# Patient Record
Sex: Male | Born: 1950 | Race: White | Hispanic: No | Smoking: Former smoker
Health system: Southern US, Community
[De-identification: ages and names within clinical notes are randomized; demographics above are authoritative.]

## PROBLEM LIST (undated history)

## (undated) DIAGNOSIS — Z862 Personal history of diseases of the blood and blood-forming organs and certain disorders involving the immune mechanism: Secondary | ICD-10-CM

## (undated) DIAGNOSIS — K7290 Hepatic failure, unspecified without coma: Secondary | ICD-10-CM

## (undated) DIAGNOSIS — I499 Cardiac arrhythmia, unspecified: Secondary | ICD-10-CM

## (undated) DIAGNOSIS — K7682 Hepatic encephalopathy: Secondary | ICD-10-CM

## (undated) DIAGNOSIS — D539 Nutritional anemia, unspecified: Secondary | ICD-10-CM

## (undated) DIAGNOSIS — I4891 Unspecified atrial fibrillation: Secondary | ICD-10-CM

## (undated) DIAGNOSIS — I509 Heart failure, unspecified: Secondary | ICD-10-CM

## (undated) DIAGNOSIS — Z860101 Personal history of adenomatous and serrated colon polyps: Secondary | ICD-10-CM

## (undated) DIAGNOSIS — E119 Type 2 diabetes mellitus without complications: Secondary | ICD-10-CM

## (undated) DIAGNOSIS — I4819 Other persistent atrial fibrillation: Secondary | ICD-10-CM

## (undated) DIAGNOSIS — D689 Coagulation defect, unspecified: Secondary | ICD-10-CM

## (undated) DIAGNOSIS — I10 Essential (primary) hypertension: Secondary | ICD-10-CM

## (undated) DIAGNOSIS — E079 Disorder of thyroid, unspecified: Secondary | ICD-10-CM

## (undated) DIAGNOSIS — I5030 Unspecified diastolic (congestive) heart failure: Secondary | ICD-10-CM

## (undated) DIAGNOSIS — F1011 Alcohol abuse, in remission: Secondary | ICD-10-CM

## (undated) DIAGNOSIS — Z7901 Long term (current) use of anticoagulants: Secondary | ICD-10-CM

## (undated) DIAGNOSIS — I38 Endocarditis, valve unspecified: Secondary | ICD-10-CM

## (undated) DIAGNOSIS — I2510 Atherosclerotic heart disease of native coronary artery without angina pectoris: Secondary | ICD-10-CM

## (undated) DIAGNOSIS — Z86010 Personal history of colonic polyps: Secondary | ICD-10-CM

## (undated) DIAGNOSIS — E039 Hypothyroidism, unspecified: Secondary | ICD-10-CM

## (undated) DIAGNOSIS — E782 Mixed hyperlipidemia: Secondary | ICD-10-CM

## (undated) DIAGNOSIS — N183 Chronic kidney disease, stage 3 unspecified: Secondary | ICD-10-CM

## (undated) DIAGNOSIS — I429 Cardiomyopathy, unspecified: Secondary | ICD-10-CM

## (undated) DIAGNOSIS — K760 Fatty (change of) liver, not elsewhere classified: Secondary | ICD-10-CM

## (undated) DIAGNOSIS — E785 Hyperlipidemia, unspecified: Secondary | ICD-10-CM

## (undated) DIAGNOSIS — I428 Other cardiomyopathies: Secondary | ICD-10-CM

## (undated) HISTORY — PX: HAMMER TOE SURGERY: SHX385

## (undated) HISTORY — DX: Chronic kidney disease, stage 3 unspecified: N18.30

## (undated) HISTORY — DX: Fatty (change of) liver, not elsewhere classified: K76.0

## (undated) HISTORY — DX: Personal history of diseases of the blood and blood-forming organs and certain disorders involving the immune mechanism: Z86.2

## (undated) HISTORY — DX: Alcohol abuse, in remission: F10.11

## (undated) HISTORY — DX: Other cardiomyopathies: I42.8

## (undated) HISTORY — DX: Personal history of adenomatous and serrated colon polyps: Z86.0101

## (undated) HISTORY — DX: Coagulation defect, unspecified: Z79.01

## (undated) HISTORY — DX: Hepatic failure, unspecified without coma: K72.90

## (undated) HISTORY — DX: Mixed hyperlipidemia: E78.2

## (undated) HISTORY — DX: Atherosclerotic heart disease of native coronary artery without angina pectoris: I25.10

## (undated) HISTORY — DX: Other persistent atrial fibrillation: I48.19

## (undated) HISTORY — DX: Nutritional anemia, unspecified: D53.9

## (undated) HISTORY — PX: CARDIAC CATHETERIZATION: SHX172

## (undated) HISTORY — DX: Unspecified diastolic (congestive) heart failure: I50.30

## (undated) HISTORY — DX: Endocarditis, valve unspecified: I38

## (undated) HISTORY — DX: Personal history of colonic polyps: Z86.010

## (undated) HISTORY — PX: COLONOSCOPY: SHX174

---
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
## 2006-06-04 ENCOUNTER — Ambulatory Visit: Payer: Self-pay | Admitting: General Surgery

## 2007-02-12 ENCOUNTER — Inpatient Hospital Stay: Payer: Self-pay | Admitting: Internal Medicine

## 2007-02-12 ENCOUNTER — Other Ambulatory Visit: Payer: Self-pay

## 2007-10-30 ENCOUNTER — Emergency Department: Payer: Self-pay | Admitting: Emergency Medicine

## 2007-10-30 ENCOUNTER — Other Ambulatory Visit: Payer: Self-pay

## 2012-02-22 ENCOUNTER — Inpatient Hospital Stay: Payer: Self-pay | Admitting: Internal Medicine

## 2012-02-22 LAB — APTT: Activated PTT: 30.1 secs (ref 23.6–35.9)

## 2012-02-22 LAB — HEPATIC FUNCTION PANEL A (ARMC)
Albumin: 4.2 g/dL
Alkaline Phosphatase: 91 U/L
Bilirubin, Direct: 0.1 mg/dL
Bilirubin,Total: 0.5 mg/dL
SGOT(AST): 32 U/L
SGPT (ALT): 25 U/L
Total Protein: 8.9 g/dL — ABNORMAL HIGH

## 2012-02-22 LAB — CK TOTAL AND CKMB (NOT AT ARMC)
CK, Total: 172 U/L (ref 35–232)
CK, Total: 171 U/L (ref 35–232)

## 2012-02-22 LAB — TROPONIN I
Troponin-I: 0.05 ng/mL
Troponin-I: 0.06 ng/mL — ABNORMAL HIGH
Troponin-I: 0.06 ng/mL — ABNORMAL HIGH

## 2012-02-22 LAB — BASIC METABOLIC PANEL
Anion Gap: 13 (ref 7–16)
BUN: 24 mg/dL — ABNORMAL HIGH (ref 7–18)
Chloride: 102 mmol/L (ref 98–107)
EGFR (African American): >60
Glucose: 139 mg/dL — ABNORMAL HIGH (ref 65–99)
Sodium: 135 mmol/L — ABNORMAL LOW (ref 136–145)

## 2012-02-22 LAB — DIGOXIN LEVEL: Digoxin: 0.7 ng/mL

## 2012-02-22 LAB — CBC
HCT: 44.2 % (ref 40.0–52.0)
MCH: 31.5 pg (ref 26.0–34.0)
MCV: 95 fL (ref 80–100)
RBC: 4.67 10*6/uL (ref 4.40–5.90)
RDW: 15.3 % — ABNORMAL HIGH (ref 11.5–14.5)
WBC: 8.5 10*3/uL (ref 3.8–10.6)

## 2012-02-22 LAB — ETHANOL
Ethanol %: <0.003 %
Ethanol: <3 mg/dL

## 2012-02-22 LAB — PROTIME-INR
INR: 1.1
Prothrombin Time: 14.3 secs (ref 11.5–14.7)

## 2012-02-22 LAB — TSH: Thyroid Stimulating Horm: 3.86 u[IU]/mL

## 2012-02-22 LAB — PRO B NATRIURETIC PEPTIDE: B-Type Natriuretic Peptide: 795 pg/mL — ABNORMAL HIGH

## 2012-02-22 LAB — MAGNESIUM: Magnesium: 1.9 mg/dL

## 2012-02-23 LAB — CBC WITH DIFFERENTIAL/PLATELET
Basophil %: 1 %
HGB: 14.7 g/dL (ref 13.0–18.0)
Lymphocyte %: 19.9 %
MCHC: 32.6 g/dL (ref 32.0–36.0)
MCV: 95 fL (ref 80–100)
Platelet: 144 10*3/uL — ABNORMAL LOW (ref 150–440)
RBC: 4.74 10*6/uL (ref 4.40–5.90)

## 2012-02-23 LAB — BASIC METABOLIC PANEL
Anion Gap: 7 (ref 7–16)
Chloride: 107 mmol/L (ref 98–107)
Creatinine: 1.38 mg/dL — ABNORMAL HIGH (ref 0.60–1.30)
Glucose: 109 mg/dL — ABNORMAL HIGH (ref 65–99)
Sodium: 138 mmol/L (ref 136–145)

## 2012-02-23 LAB — MAGNESIUM: Magnesium: 2.2 mg/dL

## 2012-02-24 LAB — URINALYSIS, COMPLETE
Bilirubin,UR: NEGATIVE
Ketone: NEGATIVE
Ph: 5 (ref 4.5–8.0)
Squamous Epithelial: NONE SEEN
WBC UR: <1 /HPF (ref 0–5)

## 2012-02-24 LAB — BASIC METABOLIC PANEL
Anion Gap: 6 — ABNORMAL LOW (ref 7–16)
Chloride: 106 mmol/L (ref 98–107)
Creatinine: 1.25 mg/dL (ref 0.60–1.30)
EGFR (Non-African Amer.): >60
Glucose: 112 mg/dL — ABNORMAL HIGH (ref 65–99)
Potassium: 5 mmol/L (ref 3.5–5.1)
Sodium: 138 mmol/L (ref 136–145)

## 2012-05-23 ENCOUNTER — Ambulatory Visit: Payer: Self-pay | Admitting: Unknown Physician Specialty

## 2012-05-24 LAB — PATHOLOGY REPORT

## 2013-01-12 ENCOUNTER — Inpatient Hospital Stay: Payer: Self-pay | Admitting: Internal Medicine

## 2013-01-12 LAB — CBC WITH DIFFERENTIAL/PLATELET
Basophil #: 0 x10 3/mm 3
Basophil %: 0.5 %
Eosinophil #: 0.1 x10 3/mm 3
Eosinophil %: 0.8 %
HCT: 43.2 %
HGB: 14.6 g/dL
Lymphocyte %: 7.7 %
Lymphs Abs: 0.5 x10 3/mm 3 — ABNORMAL LOW
MCH: 32.3 pg
MCHC: 33.7 g/dL
MCV: 96 fL
Monocyte #: 0.6 "x10 3/mm "
Monocyte %: 8.6 %
Neutrophil #: 5.8 x10 3/mm 3
Neutrophil %: 82.4 %
Platelet: 127 x10 3/mm 3 — ABNORMAL LOW
RBC: 4.51 x10 6/mm 3
RDW: 14.8 % — ABNORMAL HIGH
WBC: 7 x10 3/mm 3

## 2013-01-12 LAB — TROPONIN I
Troponin-I: 0.04 ng/mL
Troponin-I: 0.04 ng/mL
Troponin-I: 0.04 ng/mL
Troponin-I: 0.05 ng/mL

## 2013-01-12 LAB — RAPID INFLUENZA A&B ANTIGENS

## 2013-01-12 LAB — BASIC METABOLIC PANEL WITH GFR
Anion Gap: 8
BUN: 17 mg/dL
Calcium, Total: 8.7 mg/dL
Chloride: 102 mmol/L
Co2: 28 mmol/L
Creatinine: 1.49 mg/dL — ABNORMAL HIGH
EGFR (African American): 57 — ABNORMAL LOW
EGFR (Non-African Amer.): 50 — ABNORMAL LOW
Glucose: 157 mg/dL — ABNORMAL HIGH
Osmolality: 280
Potassium: 4.1 mmol/L
Sodium: 138 mmol/L

## 2013-01-12 LAB — CK TOTAL AND CKMB (NOT AT ARMC)
CK, Total: 68 U/L
CK, Total: 67 U/L (ref 35–232)
CK-MB: 0.8 ng/mL
CK-MB: 0.9 ng/mL (ref 0.5–3.6)

## 2013-01-13 LAB — BASIC METABOLIC PANEL
Anion Gap: 3 — ABNORMAL LOW (ref 7–16)
Chloride: 102 mmol/L (ref 98–107)
Creatinine: 1.32 mg/dL — ABNORMAL HIGH (ref 0.60–1.30)
EGFR (African American): >60
Glucose: 129 mg/dL — ABNORMAL HIGH (ref 65–99)
Osmolality: 274 (ref 275–301)
Potassium: 4.6 mmol/L (ref 3.5–5.1)
Sodium: 136 mmol/L (ref 136–145)

## 2013-01-13 LAB — CBC WITH DIFFERENTIAL/PLATELET
Basophil %: 0.5 %
Eosinophil #: 0 10*3/uL (ref 0.0–0.7)
Eosinophil %: 0.8 %
HCT: 40.7 % (ref 40.0–52.0)
HGB: 13.4 g/dL (ref 13.0–18.0)
MCH: 31.8 pg (ref 26.0–34.0)
MCHC: 33 g/dL (ref 32.0–36.0)
MCV: 96 fL (ref 80–100)
Monocyte #: 0.7 x10 3/mm (ref 0.2–1.0)
Monocyte %: 15.3 %
Neutrophil %: 63.4 %
Platelet: 93 10*3/uL — ABNORMAL LOW (ref 150–440)
RDW: 14.9 % — ABNORMAL HIGH (ref 11.5–14.5)
WBC: 4.5 10*3/uL (ref 3.8–10.6)

## 2013-01-16 LAB — CULTURE, BLOOD (SINGLE)

## 2013-04-28 DIAGNOSIS — I4819 Other persistent atrial fibrillation: Secondary | ICD-10-CM | POA: Insufficient documentation

## 2013-04-28 DIAGNOSIS — I5020 Unspecified systolic (congestive) heart failure: Secondary | ICD-10-CM | POA: Insufficient documentation

## 2013-04-28 DIAGNOSIS — Z7901 Long term (current) use of anticoagulants: Secondary | ICD-10-CM

## 2013-04-28 DIAGNOSIS — D689 Coagulation defect, unspecified: Secondary | ICD-10-CM | POA: Insufficient documentation

## 2013-04-28 DIAGNOSIS — I509 Heart failure, unspecified: Secondary | ICD-10-CM | POA: Insufficient documentation

## 2013-04-28 DIAGNOSIS — I4891 Unspecified atrial fibrillation: Secondary | ICD-10-CM | POA: Insufficient documentation

## 2013-04-28 DIAGNOSIS — I519 Heart disease, unspecified: Secondary | ICD-10-CM | POA: Insufficient documentation

## 2014-05-11 NOTE — Discharge Summary (Signed)
PATIENT NAME:  [name], [name] MR#:  [id number] DATE OF BIRTH:  1951/01/10  DATE OF ADMISSION:  02/22/2012 DATE OF DISCHARGE:  02/24/2012  PRIMARY CARE PHYSICIAN:  Dr. Hall Busing.  FINAL DIAGNOSES:  Atrial fibrillation with rapid ventricular response, hypertension, alcohol abuse, congestive heart failure diastolic dysfunction, compensated.   CONDITION:  Stable.   CODE STATUS:  FULL CODE.   MEDICATIONS:  Aspirin 81 mg by mouth daily, Digoxin  155 mcg by mouth daily, Lasix 20 mg by mouth daily, Lopressor 25 mg by mouth daily, lisinopril 5 mg by mouth daily, potassium 10 mEq by mouth daily, metformin 500 mg by mouth once daily, allopurinol 300 mg by mouth daily.   DIET:  Low sodium, low cholesterol, ADA diet.   ACTIVITY:  As tolerated.   FOLLOW-UP CARE:  Follow up PCP within 1 to 2 weeks.  Follow up Dr. Saralyn Pilar within 1 to 2 weeks.  Alcohol cessation.  REASON FOR ADMISSION:  Rapid heartbeat.   HOSPITAL COURSE:   1.  The patient is a 64 year old Caucasian male with a history of A-Fib, congestive heart failure with an ejection fraction of 45% to 50%, presented to the ED with a rapid heartbeat.  In ED his heart rate was 130.  The patient received one dose of Lopressor and some Lasix.  For detailed history and physical examination, please refer to the admission note dictated by Dr. Laurin Coder.  The patient's EKG showing A-Fib with RVR.  After admission, patient was treated with a beta-blocker, Lopressor 5 mg IV q. 8 hours.  Since the heart rate is controlled, I change Lopressor to 25 mg by mouth twice daily IV.  In addition, the patient received digoxin and according to Dr. Saralyn Pilar, continue current treatment, but patient is not a good candidate for anticoagulation.  2.  Congestive heart failure with diastolic dysfunction, is compensated.  Stable.  3.  Dehydration.  The patient has mild dehydration with a BUN increased to 28, creatinine increased to 1.38.  The patient got gentle rehydration, BUN  decreased to 21, creatinine decreased to 1.25.  4.  For alcohol abuse, patient placed on CIWA protocol.  5.  For hypertension, patient received lisinopril, Lopressor and the Lasix.  The patient is clinically stable.    The patient is discharged to home today.  Discussed the patient's discharge plan with the patient and the case manager.   TIME SPENT:  About 35 minutes.     ____________________________ Demetrios Loll, MD qc:ea D: 02/24/2012 17:18:47 ET T: 02/25/2012 06:14:35 ET JOB#: [id number]  cc: Demetrios Loll, MD, <Dictator> Demetrios Loll MD ELECTRONICALLY SIGNED 02/25/2012 18:41

## 2014-05-11 NOTE — Consult Note (Signed)
PATIENT NAME:  [name], [name] MR#:  [id number] DATE OF BIRTH:  06-23-1950  DATE OF CONSULTATION:  02/23/2012  CONSULTING PHYSICIAN:  Isaias Cowman, MD  PRIMARY CARE PHYSICIAN: Leona Carry. Hall Busing, MD  CHIEF COMPLAINT: "I fell and hurt my back."   REASON FOR CONSULTATION: Consultation requested for evaluation of atrial fibrillation.   HISTORY OF PRESENT ILLNESS: The patient is a 64 year old gentleman with history of chronic atrial fibrillation, mildly reduced left ventricular function, recurrent congestive heart failure. The patient apparently was in his usual state of health until 02/22/2012 when he fell in the shower. The patient apparently had imbibed 12 beers prior to the event. He came to [facility] Emergency Room primarily for back pain but also pounding in his chest. In the Emergency Room, the patient was noted to be in atrial fibrillation with a rapid ventricular rate and was admitted to telemetry. The patient had borderline elevated troponin of 0.06. The patient denies angina. EKG was nondiagnostic. The patient reports feeling much better today, near baseline.   PAST MEDICAL HISTORY:  1.  Chronic atrial fibrillation.  2.  History of mildly reduced left ventricular function with LVEF of 45% with history of congestive heart failure.  3.  Alcohol abuse. 4.  Diabetes.   MEDICATIONS ON ADMISSION: Aspirin 81 mg daily, metoprolol succinate 25 mg daily, potassium chloride 10 mEq daily, lisinopril 5 mg daily, Lasix 20 mg daily.   SOCIAL HISTORY: The patient currently lives alone. He works as a Dealer. He quit tobacco abuse 15 years ago. He drinks at least 3 beers per day.   FAMILY HISTORY: No immediate family history for coronary artery disease or myocardial infarction.   REVIEW OF SYSTEMS:  CONSTITUTIONAL: No fever or chills.  EYES: No blurry vision.  EARS: No hearing loss.  RESPIRATORY: No shortness of breath.  CARDIOVASCULAR: The patient currently denies chest pain.  GASTROINTESTINAL: No  nausea, vomiting, diarrhea or constipation.  GENITOURINARY: No dysuria or hematuria.  ENDOCRINE: No polyuria or polydipsia.  MUSCULOSKELETAL: No arthralgias or myalgias.  NEUROLOGICAL: No focal muscle weakness or numbness.   PHYSICAL EXAMINATION:  VITAL SIGNS: Blood pressure 127/60, pulse 82, respirations 16, temperature 98.2, pulse oximetry 97%.  HEENT: Pupils equal and reactive to light and accommodation.  NECK: Supple without thyromegaly.  LUNGS: Clear.  HEART: Normal JVP. Normal PMI. Irregularly, irregular rhythm. Normal S1, S2. No appreciable gallop, murmur or rub.  ABDOMEN: Soft and nontender.  EXTREMITIES: Pulses were intact bilaterally.  MUSCULOSKELETAL: Normal muscle tone.  NEUROLOGIC: The patient is alert and oriented x 3. Motor and sensory both grossly intact.   IMPRESSION: A 64 year old gentleman with chronic atrial fibrillation who presents with rapid ventricular response following falling and hurting his back with some mild dehydration, which has now improved. The patient has borderline elevated troponin, which is likely demand supply ischemia, unlikely due to acute coronary syndrome in the absence of anginal chest pain. The patient has a CHADS2 score of 2 but has been reluctant to be on warfarin, primarily due to the fact of his occupation as a Dealer.   RECOMMENDATIONS:  1.  Agree with overall current therapy.  2.  Continue aspirin for stroke risk reduction.  3.  Agree with current rate control medications with metoprolol and digoxin.  4.  Review 2D echocardiogram.  5.  Strongly encouraged patient to abstain from alcohol.    ____________________________ Isaias Cowman, MD ap:jm D: 02/23/2012 13:47:21 ET T: 02/23/2012 14:22:44 ET JOB#: [id number]  cc: Isaias Cowman, MD, <Dictator> Isaias Cowman MD  ELECTRONICALLY SIGNED 03/15/2012 14:57

## 2014-05-11 NOTE — H&P (Signed)
PATIENT NAME:  KYREEM, LAWVER MR#:  [id number] DATE OF BIRTH:  1950-05-24  DATE OF ADMISSION:  02/22/2012  REFERRING PHYSICIAN:  Eula Listen, MD   PRIMARY CARE PHYSICIAN:  Leona Carry. Hall Busing, MD  PRIMARY CARDIOLOGIST: Isaias Cowman, MD  CHIEF COMPLAINT: Rapid heartbeat.   HISTORY OF PRESENT ILLNESS: The patient is a very nice 64 year old gentleman who has history of atrial fibrillation with RVR, previous congestive heart failure when he was admitted over here in 2009. He had an ejection fraction of 45% to 50%. I do not see any repeat echocardiogram since then. He says that he visits Dr. Saralyn Pilar is on a regular basis. The patient is a heavy alcohol drinker. He drinks more than three beers at night, probably somewhere around five every day and yesterday he had at least 12 beers and a fifth of liquor. The patient was feeling okay, went to the bathroom to take a shower, fell in the bathroom and hit his back and went back to bed. He was having some pain, but he was able to tolerate it. In the morning got up, went to work and during the time that he was working, he starting to feel his heart racing. His heart was pounding against his chest really hard and he got really concerned, nervous. For that reason, he came to the ER. He did not have any chest pain. He did not have any shortness of breath during all those episodes. He states that he is not in any pain at this moment on his back. I was asked to admit the patient because at the beginning in the ER, his heart rate was around 130. The patient  received a dose of metoprolol and some Lasix due to a possible congestive heart failure exacerbation. The patient looks actually dehydrated and there is no increase on oxygen demand. No increase of work of breathing and actually his lungs sound pretty clear. I think that he will benefit with some fluids.   REVIEW OF SYSTEMS:  CONSTITUTIONAL: No fatigue. No weakness. No fever. No significant weight loss or  weight gain.  EYES: No blurry vision. No double vision. No inflammation.  ENT: No tinnitus. No postnasal drip. No sinus pain. No difficulty swallowing.  RESPIRATIONS: The patient denies any cough, any wheezing, any hemoptysis. No difficulty breathing. No COPD, not pneumonia.   CARDIOVASCULAR: No chest pain, no orthopnea. He does have chronic edema, but they have not changed significantly. He does have arrhythmias, atrial fibrillation. He only takes aspirin; because of his work, he has multiple lacerations and potential bleeding. He did have palpitations, today, no syncope, no varicose veins.  GASTROINTESTINAL: No nausea or vomiting. No diarrhea. No abdominal pain. No constipation. No hemorrhoids.   GENITOURINARY: No dysuria, hematuria or changes in frequency. No prostatitis.  ENDOCRINOLOGY: No polyuria, polydipsia, or polyphagia. No cold or heat intolerance.  HEMATOLOGIC/LYMPHATIC: No anemia, easy bruising, bleeding, or swollen glands. He is on aspirin only.  MUSCULOSKELETAL: No significant neck pain, back pain or shoulder pain. He has osteoarthritis due to his line of  work, but not severe. He has gout, but it has not been exacerbated.  NEUROLOGIC: No numbness, tingling. No ataxia. No CVAs. No TIAs.  PSYCHIATRIC: No insomnia. No nervousness. The patient was a little bit anxious due to his heart palpitations, but now he is a little bit better.   PAST MEDICAL HISTORY:  1.  CHF with an ejection fraction of 45% to [id number], mostly diastolic.  2.  Hypertension.  3.  Atrial fibrillation.  4.  Gout.  5.  Alcohol abuse.  6.  Former smoker.   ALLERGIES: THE PATIENT IS ALLERGIC TO PENICILLIN GIVES HIM A RASH.   PAST SURGICAL HISTORY: He denies any surgical interventions.   FAMILY HISTORY: Positive for CHF in his mom, dad and brother. No history of MIs. His older brother has colon cancer.   SOCIAL HISTORY: Patient works as a Dealer. He drinks at least three beers a day, sometimes more. He drinks every  day after work. He used to smoke, he quit 15 years ago. He smoked for over 30 years and he dipped up until about 3 weeks ago. He is single, lives by himself.   MEDICATIONS: Potassium chloride 10 mEq daily, metoprolol 25 mg once daily, lisinopril 5 mg once daily, Lasix 20 mg once a day, digoxin 125 mg once a day, aspirin enteric coated 81 mg once daily.   PHYSICAL EXAMINATION: VITAL SIGNS: Blood pressure 167/81, pulse 110 to 133, respirations 20, temperature 98.4. His last pulse on the monitor, when I was evaluating the patient, was 80 to 90.  EYES: Pupils are equal and reactive. Extraocular movements are intact. Mucosa is moist.  GENERAL: Patient looks alert and oriented x3, in no acute distress. No respiratory distress. Hemodynamically stable. He looks a little dry.  ENT: No oral lesions. No oropharyngeal exudates.  NECK: Supple. No JVD. No thyromegaly. No adenopathy. No carotid bruits. No rigidity.  CARDIOVASCULAR: Irregularly irregular. No rubs or gallops. No displacement of PMI. No tenderness to palpation of his chest wall.  LUNGS: The patient does not have any crackles. His lungs are clear. Good air expansion and air entrance. No use of accessory muscles. No dullness to percussion.  ABDOMEN: Soft, nontender, nondistended, no hepatosplenomegaly. No masses. Bowel sounds are positive.  EXTREMITIES: Positive edema +1, the patient states that this is normal for him to retain some fluid especially when he is on his feet for a while after working.  No ecchymoses, no cyanosis. Pulses +2. Capillary refill less than 3.  SKIN: Without any rashes or petechiae.  NEUROLOGIC: Cranial nerves II through XII intact.   PSYCHIATRIC: Negative for anxiety or depression. Patient is alert and oriented x3.  LYMPHATIC: Negative for lymphadenopathy in neck or supraclavicular areas.  MUSCULOSKELETAL: No significant joint deformity or joint effusions.   LABORATORY, DIAGNOSTIC AND RADIOLOGICAL DATA: Glucose 139. BNP 795.  BUN 24, creatinine 125, sodium 135, potassium 4.5, CO2 is around 20. Total protein is 8.9. LFTs overall within normal limits. First troponin 0.05,  second troponin 0.06, is slightly elevated. CK and his CPK-MB are normal.  TSH 386, digoxin 0.07. White blood cells 8.5, hemoglobin 14 and platelets 150.   Chest x-ray shows globular cardiac silhouette. No significant exudates. Maybe mild CHF, although the patient clinically does not look fluid overloaded and he does not have any respiratory findings. Might be chronic changes of his x-ray.   EKG: Atrial fibrillation with RVR. No significant T wave elevation. There is mild ST depression of 1 mm on lateral leads, but not significant for ischemia, likely is due to his atrial fibrillation. He has no signs of LVH.   ASSESSMENT AND PLAN: A 65 year old gentleman with history of congestive heart failure, hypertension, atrial fibrillation, gout, alcohol abuse, who comes with history of fall last night and now rapid ventricular response.  1.  Atrial fibrillation with rapid ventricular response. The patient is admitted for observation and medication management. I changed his beta blocker to IV, since his blood  pressure is so high and since his heart rate went up to the 130s. He might benefit from increasing the dose of his oral metoprolol, maybe even increasing his dose of his digoxin.  At this moment, he does not need to be in any drips. We are going to give him beta blockers, p.r.n. if necessary. The patient is going to be seeing Dr. Saralyn Pilar in consult, since Dr. Saralyn Pilar is his primary care cardiologist. There are no signs of acute coronary artery event, although his troponin is slightly elevated on second set. We are going to do serial troponins just to make sure and this is likely due to demand ischemia.  2.  Congestive heart failure. The patient, at this moment, looks very compensated. There are no signs of respiratory distress. No crackles in his lungs. He has  been given Lasix x1. I do not think that we need to continue any more IV Lasix at this moment. We will put the patient on gentle hydration with a banana bag and monitor his electrolytes. Replace potassium if necessary.  3.  Alcohol abuse. The patient is a heavy drinker. I gave him counseling for alcohol for at least 8 minutes. The patient says that he can stop drinking and he will stop drinking. He says that he does not need any help. I recommended to look for help, as it has been proven that groups like Alcoholic Anonymous group help with this problem and it is best way to resolve but the patient is aware of this, but he says that he can do it without help.  4.  Hypertension. The patient has elevated blood pressure. Continue medications like lisinopril I am going to restart his Lasix tomorrow morning at 20 mg a day. Other than that, the patient looks stable.    TIME SPENT: I spent about 45 minutes with this patient today.   CODE STATUS:  He is a full code.    ____________________________ Pandora Sink, MD rsg:cc D: 02/22/2012 15:16:09 ET T: 02/22/2012 16:04:23 ET JOB#: [id number]  cc: [place] Sink, MD, <Dictator> Leona Carry. Hall Busing, MD Isaias Cowman, MD  Cristi Loron MD ELECTRONICALLY SIGNED 03/01/2012 13:44

## 2014-05-12 NOTE — Discharge Summary (Signed)
PATIENT NAME:  [name], [name] MR#:  [id number] DATE OF BIRTH:  [birth year]  DATE OF ADMISSION:  01/12/2013 DATE OF DISCHARGE:  01/13/2013  ADMITTING DIAGNOSES:  1.  Shortness of breath. 2.  Palpitations. 3.  Fever.  DISCHARGE DIAGNOSES:  1.  Palpitations and fevers, likely due to possible pneumonia, possible acute bronchitis.   2.  Palpitations due to Atrial fibrillation with rapid ventricular response with the patient having history of chronic atrial fibrillation. His Toprol is adjusted.  3.  Possible chronic obstructive pulmonary disease exacerbation, although the patient has no history of chronic obstructive pulmonary disease. He has no wheezing. 4.  Diabetes. 5.  Elevated creatinine, possible chronic in nature and needs outpatient followup with primary MD. 6.  Hypertension.  7.  Chronic atrial fibrillation with CHADS VASc score of around 3. I have discussed anticoagulation with the patient. He states that due to the kind of work he does he is not interested in doing anticoagulation. I have explained to him the risks of a stroke. He has a cardiologist. He needs to re-discuss this with him again.  8.  His of gout. 9.  Previous history of alcohol use. 10.  History of congestive heart failure, type unknown.  CONSULTANTS: None.  PERTINENT LABS AND EVALUATIONS: Admitting WBC 7, hemoglobin 14.6, platelet count 127. Blood cultures: No growth. BMP: Glucose 157, BUN 17, creatinine 1.49, sodium 138, potassium 4.1, chloride 102, CO2 28. Influenza A and B was negative. Chest x-ray showed cardiomegaly and possible pulmonary vascular congestion. BNP was 780. Most recent creatinine today is 1.32, WBC 4.5.   HOSPITAL COURSE: Please refer to H and P done by the admitting physician. The patient is a 64 year old white male with history of hypertension, and previous history of paroxysmal A-fib who presented to the Emergency Room complaining of fever, generalized body aches, mild cough, and nasal congestion.  The patient was admitted. He was also noticed to have a fever of 101.2. He had an EKG that showed A-fib with RVR. Due to his symptoms, he was seen in the ED. The chest x-ray suggested a possible pneumonia. The patient was started on IV antibiotics. For his A-fib, he was given some Cardizem with improvement in his heart rate. The patient was also thought to have possible pneumonia and also thought to have possible flu; however, his influenza A and B were negative and his symptoms rapidly improved. Today his shortness of breath was significantly improved, he was doing much better, and he is interested in going home.  In terms of his atrial fibrillation, his CHADS VASc score is at 3. I recommended anticoagulation. His risk of stroke was explained to him to be between 4 and 5%. With anticoagulation it would be reduced to between 1 and 2%. 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 I strongly recommended for him to reconsider anticoagulation, discuss with his cardiologist, Dr. Saralyn Pilar, regarding treatment. At this time, he is stable for discharge.   DISCHARGE MEDICATIONS:  1.  Digoxin 125 mcg daily. 2.  Lasix 20 mg 1 tablet p.o. daily. 3.  KCL 10 mEq daily. 4.  Metformin 500 mg daily. 5.  Allopurinol 300 mg daily. 6.  Aspirin 325 mg daily. 7.  Metoprolol succinate 50 mg 1 tab p.o. daily. 8.  Guaifenesin 600 mg 1 tab p.o. b.i.d. 9.  Fluticasone 2 sprays daily. 10.  Levaquin 750 mg 1 tab p.o. q. 24 for the next 4 days. 11.  Lisinopril 2.5 mg daily.  DISCHARGE DIET: Low sodium, carbohydrate-controlled.  DISCHARGE ACTIVITY: As tolerated.   DISCHARGE INSTRUCTIONS: Follow up with primary MD in 1 to 2 weeks. Follow up with Dr. Saralyn Pilar in 2 to 4 weeks to re-discuss his anticoagulation therapy.   TIME SPENT ON DISCHARGE: 35 minutes.   ____________________________ Lafonda Mosses Posey Pronto, MD shp:sb D: 01/13/2013 15:14:00 ET T: 01/13/2013 15:26:23 ET JOB#: [id number]  cc: Briani Maul H. Posey Pronto, MD, <Dictator> Alric Seton MD ELECTRONICALLY SIGNED 01/22/2013 8:31

## 2014-05-12 NOTE — H&P (Signed)
PATIENT NAME:  [name], DIDONNA MR#:  [id number] DATE OF BIRTH:  1950/04/11  DATE OF ADMISSION:  01/12/2013  PRIMARY CARE PHYSICIAN:  Dr. Benita Stabile.   REFERRING PHYSICIAN:  Dr. Lurline Hare.   CHIEF COMPLAINT:  Palpitations, fever.   HISTORY OF PRESENT ILLNESS:  [name] is a 64 year old, pleasant, white male with a history of hypertension, previous history of paroxysmal atrial fibrillation, presented to the Emergency Department with complaints of fever, generalized body aches, mild cough and palpitations, started 10:00 p.m.  The patient was doing well throughout the day.  Started to experience severe generalized body aches associated with chills.  They checked his temperature.  It was found to have 102.  The patient was having heart rate of 130s to 140s.  Concerning this, came to the Emergency Department.  Work-up in the Emergency Department, EKG showed atrial fibrillation with rapid ventricular rate with a heart rate of 140.  The patient is also found to have fever of 101.2.  Chest x-ray showed pulmonary vascular condition, suggest volume overload.  Denies having any shortness of breath.  Denies having any PND, orthopnea, lower extremity swelling.  Denies having any chest pain.  The patient does not have any elevated white blood cell count.  The patient received one dose of levofloxacin in the Emergency Department.   PAST MEDICAL HISTORY: 1.  Hypertension.  2.  Diabetes mellitus.  3.  Atrial fibrillation.  4.  Gout.  5.  Previous history of alcohol use.  6.  Congestive heart failure.   PAST SURGICAL HISTORY:  None.   ALLERGIES:  PENICILLIN.   HOME MEDICATIONS: 1.  Potassium chloride 10 mEq daily.  2.  Metoprolol XL 25 mg daily.  3.  Metformin 500 mg once a day.  4.  Lisinopril 5 mg once a day.  5.  Lasix 20 mg daily.  6.  Digoxin 125 mcg daily.  7.  Aspirin enteric-coated 81 mg daily.  8.  Allopurinol 300 mg once a day.   SOCIAL HISTORY:  Previous history of smoking, quit 15 years  back.  Currently denies drinking alcohol.  The last drink was in November 2014, married, lives with his wife.   FAMILY HISTORY:  Positive for congestive heart failure in mother, father and brother.   REVIEW OF SYSTEMS: CONSTITUTIONAL:  Generalized weakness.  EYES:  No change in vision.  EARS, NOSE, THROAT:  No change in hearing, somewhat experiences right ear fullness.  RESPIRATORY:  Has cough.  No productive sputum.  CARDIOVASCULAR:  No chest pain, has experiencing palpitations.   GASTROINTESTINAL:  No nausea, vomiting, abdominal pain.  GENITOURINARY:  No dysuria or hematuria.  HEMATOLOGIC:  No easy bruising or bleeding.  SKIN:  No rashes or lesions.  MUSCULOSKELETAL:  Generalized body aches.  NEUROLOGIC:  No numbness or weakness in any part of the body.   PHYSICAL EXAMINATION: GENERAL:  This is well-built, well-nourished, age-appropriate male lying down in the bed, ill-looking.  VITAL SIGNS:  Temperature 99.7, pulse 107, blood pressure 119/71, respiratory rate of 22, oxygen saturation is 95% on 2 liters of oxygen.  HEENT:  Head normocephalic, atraumatic.  Eyes, no scleral icterus.  Conjunctivae normal.  Pupils equal and react to light.  Extraocular movements are intact.  Mucous membranes moist.  No pharyngeal erythema.  Has mild submandibular lymphadenopathy, soft. NECK:  Supple.  No JVD.  No carotid bruit.  No thyromegaly.  CHEST:  Has no focal tenderness.  Somewhat coarse breath sounds in bilateral lower lobes.  HEART:  S1 and S2, irregularly regular, tachycardia.  ABDOMEN:  Obese.  Bowel sounds plus.  Soft, nontender, nondistended.  No hepatosplenomegaly.  EXTREMITIES:  No pedal edema.  Pulses 2+.  SKIN:  No rash or lesions.  MUSCULOSKELETAL:  Good range of motion in all the extremities.  NEUROLOGIC:  No weakness or numbness in part of the body.  Motor 5 by 5 in upper and lower extremities.  Cranial nerves II through XII intact.  The patient is alert, oriented to place, person and  time.   LABORATORY DATA:  Complete metabolic panel:  BUN 17, creatinine of 1.49.  The rest of all the values are within normal limits.   CBC:  WBC of 7, hemoglobin 14.6, platelet count of 127.   ASSESSMENT AND PLAN:  [name] is a 64 year old male who comes to the Emergency Department with flu-like symptoms in atrial fibrillation with rapid ventricular rate.  1.  Flu-like symptoms, most likely it is from the influenza.  The patient had recent contact with his grandchildren who had similar symptoms.  We will order the flu test; however, start the patient on Tamiflu.  2.  Palpitations.  This is most likely secondary to fever, however considering the patient's previous history of atrial fibrillation with rapid ventricular rate, admit the patient to the monitored bed.  Continuing the home medications of Toprol-XL as well as keep the patient on Cardizem as needed.  3.  Pneumonia.  The patient does not have any infiltrate and no elevated white blood cell count.  The patient is on levofloxacin.  4.  Mild chronic obstructive pulmonary disease exacerbation.  The patient has coarse breath sounds, has cough, looks lethargic.  We will keep the patient on DuoNebs and Solu-Medrol.  5.  Diabetes mellitus.  Hold the metformin as the patient has a creatinine of 1.46.  Keep the patient on sliding-scale insulin.  6.  Hypertension, continue with the home medications.  7.  Keep the patient on deep vein thrombosis prophylaxis with Lovenox.   TIME SPENT:  45 minutes.     ____________________________ Monica Becton, MD pv:ea D: 01/12/2013 01:57:27 ET T: 01/12/2013 03:19:49 ET JOB#: [id number]  cc: Monica Becton, MD, <Dictator> Leona Carry. Hall Busing, MD Grier Mitts Lucero Ide MD ELECTRONICALLY SIGNED 01/27/2013 21:13

## 2015-06-03 DIAGNOSIS — Z860101 Personal history of adenomatous and serrated colon polyps: Secondary | ICD-10-CM | POA: Insufficient documentation

## 2015-06-03 DIAGNOSIS — Z862 Personal history of diseases of the blood and blood-forming organs and certain disorders involving the immune mechanism: Secondary | ICD-10-CM | POA: Insufficient documentation

## 2015-06-03 DIAGNOSIS — Z86010 Personal history of colonic polyps: Secondary | ICD-10-CM | POA: Insufficient documentation

## 2015-07-25 ENCOUNTER — Encounter: Payer: Self-pay | Admitting: *Deleted

## 2015-07-26 ENCOUNTER — Encounter
Admission: RE | Disposition: A | Discharge status: Home / Self Care | Payer: Self-pay | Source: Ambulatory Visit | Attending: Unknown Physician Specialty

## 2015-07-26 ENCOUNTER — Ambulatory Visit: Payer: BLUE CROSS/BLUE SHIELD | Admitting: Anesthesiology

## 2015-07-26 ENCOUNTER — Encounter: Payer: Self-pay | Admitting: Anesthesiology

## 2015-07-26 ENCOUNTER — Ambulatory Visit
Admission: RE | Admit: 2015-07-26 | Discharge: 2015-07-26 | Disposition: A | Discharge status: Home / Self Care | Payer: BLUE CROSS/BLUE SHIELD | Source: Ambulatory Visit | Attending: Unknown Physician Specialty | Admitting: Unknown Physician Specialty

## 2015-07-26 DIAGNOSIS — K640 First degree hemorrhoids: Secondary | ICD-10-CM | POA: Diagnosis not present

## 2015-07-26 DIAGNOSIS — D124 Benign neoplasm of descending colon: Secondary | ICD-10-CM | POA: Diagnosis not present

## 2015-07-26 DIAGNOSIS — I110 Hypertensive heart disease with heart failure: Secondary | ICD-10-CM | POA: Insufficient documentation

## 2015-07-26 DIAGNOSIS — Z87891 Personal history of nicotine dependence: Secondary | ICD-10-CM | POA: Diagnosis not present

## 2015-07-26 DIAGNOSIS — D122 Benign neoplasm of ascending colon: Secondary | ICD-10-CM | POA: Insufficient documentation

## 2015-07-26 DIAGNOSIS — Z7984 Long term (current) use of oral hypoglycemic drugs: Secondary | ICD-10-CM | POA: Insufficient documentation

## 2015-07-26 DIAGNOSIS — Z79899 Other long term (current) drug therapy: Secondary | ICD-10-CM | POA: Insufficient documentation

## 2015-07-26 DIAGNOSIS — I429 Cardiomyopathy, unspecified: Secondary | ICD-10-CM | POA: Insufficient documentation

## 2015-07-26 DIAGNOSIS — Z1211 Encounter for screening for malignant neoplasm of colon: Secondary | ICD-10-CM | POA: Insufficient documentation

## 2015-07-26 DIAGNOSIS — E119 Type 2 diabetes mellitus without complications: Secondary | ICD-10-CM | POA: Insufficient documentation

## 2015-07-26 DIAGNOSIS — I509 Heart failure, unspecified: Secondary | ICD-10-CM | POA: Insufficient documentation

## 2015-07-26 DIAGNOSIS — D120 Benign neoplasm of cecum: Secondary | ICD-10-CM | POA: Diagnosis not present

## 2015-07-26 DIAGNOSIS — I4891 Unspecified atrial fibrillation: Secondary | ICD-10-CM | POA: Diagnosis not present

## 2015-07-26 DIAGNOSIS — Z86010 Personal history of colonic polyps: Secondary | ICD-10-CM | POA: Insufficient documentation

## 2015-07-26 HISTORY — DX: Unspecified atrial fibrillation: I48.91

## 2015-07-26 HISTORY — DX: Hyperlipidemia, unspecified: E78.5

## 2015-07-26 HISTORY — PX: COLONOSCOPY WITH PROPOFOL: SHX5780

## 2015-07-26 HISTORY — DX: Type 2 diabetes mellitus without complications: E11.9

## 2015-07-26 HISTORY — DX: Essential (primary) hypertension: I10

## 2015-07-26 HISTORY — DX: Heart failure, unspecified: I50.9

## 2015-07-26 HISTORY — DX: Cardiomyopathy, unspecified: I42.9

## 2015-07-26 LAB — CBC
HEMATOCRIT: 43.6 % (ref 40.0–52.0)
HEMOGLOBIN: 15 g/dL (ref 13.0–18.0)
MCH: 33.6 pg (ref 26.0–34.0)
MCHC: 34.4 g/dL (ref 32.0–36.0)
MCV: 97.5 fL (ref 80.0–100.0)
Platelets: 106 10*3/uL — ABNORMAL LOW (ref 150–440)
RBC: 4.47 MIL/uL (ref 4.40–5.90)
RDW: 16.2 % — AB (ref 11.5–14.5)
WBC: 5.9 10*3/uL (ref 3.8–10.6)

## 2015-07-26 LAB — GLUCOSE, CAPILLARY: Glucose-Capillary: 132 mg/dL — ABNORMAL HIGH (ref 65–99)

## 2015-07-26 SURGERY — COLONOSCOPY WITH PROPOFOL
Anesthesia: General

## 2015-07-26 MED ORDER — PROPOFOL 10 MG/ML IV BOLUS
INTRAVENOUS | Status: DC | PRN
  Administered 2015-07-26: 20 mg via INTRAVENOUS

## 2015-07-26 MED ORDER — MIDAZOLAM HCL 2 MG/2ML IJ SOLN
INTRAMUSCULAR | Status: DC | PRN
  Administered 2015-07-26: 1 mg via INTRAVENOUS

## 2015-07-26 MED ORDER — LIDOCAINE HCL (CARDIAC) 20 MG/ML IV SOLN
INTRAVENOUS | Status: DC | PRN
  Administered 2015-07-26: 20 mg via INTRAVENOUS

## 2015-07-26 MED ORDER — FENTANYL CITRATE (PF) 100 MCG/2ML IJ SOLN
INTRAMUSCULAR | Status: DC | PRN
  Administered 2015-07-26: 50 ug via INTRAVENOUS

## 2015-07-26 MED ORDER — PROPOFOL 500 MG/50ML IV EMUL
INTRAVENOUS | Status: DC | PRN
  Administered 2015-07-26: 120 ug/kg/min via INTRAVENOUS

## 2015-07-26 MED ORDER — PHENYLEPHRINE HCL 10 MG/ML IJ SOLN
INTRAMUSCULAR | Status: DC | PRN
  Administered 2015-07-26 (×3): 100 ug via INTRAVENOUS

## 2015-07-26 MED ORDER — SODIUM CHLORIDE 0.9 % IV SOLN: INTRAVENOUS | Status: DC

## 2015-07-26 MED ORDER — SODIUM CHLORIDE 0.9 % IV SOLN
INTRAVENOUS | Status: DC
  Administered 2015-07-26: 1000 mL via INTRAVENOUS

## 2015-07-26 NOTE — Op Note (Signed)
[facility] Patient Name: [name] Procedure Date: 07/26/2015 10:27 AM MRN: [id number] Account #: [id number] Date of Birth: [birth year] Admit Type: Outpatient Age: 65 Room: [facility] ENDO ROOM 4 Gender: Male Note Status: Finalized Procedure:            Colonoscopy Indications:          High risk colon cancer surveillance: Personal history                        of colonic polyps Providers:            Manya Silvas, MD Referring MD:         Leona Carry. Hall Busing, MD (Referring MD) Medicines:            Propofol per Anesthesia Complications:        No immediate complications. Procedure:            Pre-Anesthesia Assessment:                       - After reviewing the risks and benefits, the patient                        was deemed in satisfactory condition to undergo the                        procedure.                       After obtaining informed consent, the colonoscope was                        passed under direct vision. Throughout the procedure,                        the patient's blood pressure, pulse, and oxygen                        saturations were monitored continuously. The                        Colonoscope was introduced through the anus and                        advanced to the the cecum, identified by appendiceal                        orifice and ileocecal valve. The colonoscopy was                        performed without difficulty. The patient tolerated the                        procedure well. The quality of the bowel preparation                        was adequate to identify polyps. Findings:      The previous site sen with Ink injections still present.      Two sessile polyps were found in the cecum. The polyps were diminutive       in size. These polyps were removed with a jumbo cold forceps. Resection  and retrieval were complete.      A small polyp was found in the ascending colon. The polyp was sessile.       The  polyp was removed with a jumbo cold forceps. Resection and retrieval       were complete. To prevent bleeding after the polypectomy, one hemostatic       clip was successfully placed. There was no bleeding at the end of the       procedure.      A 10 mm polyp was found in the proximal descending colon. The polyp was       sessile. The polyp was removed with a hot snare. Resection and retrieval       were complete. To prevent bleeding after the polypectomy, two hemostatic       clips were successfully placed. There was no bleeding during, or at the       end, of the procedure.      Internal hemorrhoids were found during endoscopy. The hemorrhoids were       small and Grade I (internal hemorrhoids that do not prolapse). Impression:           - Two diminutive polyps in the cecum, removed with a                        jumbo cold forceps. Resected and retrieved.                       - One small polyp in the ascending colon, removed with                        a jumbo cold forceps. Resected and retrieved. Clip was                        placed.                       - One 10 mm polyp in the proximal descending colon,                        removed with a hot snare. Resected and retrieved. Clips                        were placed.                       - Internal hemorrhoids. Recommendation:       - Await pathology results. Manya Silvas, MD 07/26/2015 11:15:07 AM This report has been signed electronically. Number of Addenda: 0 Note Initiated On: 07/26/2015 10:27 AM Scope Withdrawal Time: 0 hours 28 minutes 33 seconds  Total Procedure Duration: 0 hours 34 minutes 41 seconds       [facility]

## 2015-07-26 NOTE — Transfer of Care (Signed)
Immediate Anesthesia Transfer of Care Note  Patient: [name]  Procedure(s) Performed: Procedure(s): COLONOSCOPY WITH PROPOFOL (N/A)  Patient Location: PACU  Anesthesia Type:General  Level of Consciousness: sedated  Airway & Oxygen Therapy: Patient Spontanous Breathing and Patient connected to nasal cannula oxygen  Post-op Assessment: Report given to RN and Post -op Vital signs reviewed and stable  Post vital signs: Reviewed  Last Vitals:  Filed Vitals:   07/26/15 0919  BP: 131/77  Pulse: 85  Temp: 36.1 C  Resp: 16    Last Pain: There were no vitals filed for this visit.       Complications: No apparent anesthesia complications

## 2015-07-26 NOTE — Anesthesia Postprocedure Evaluation (Signed)
Anesthesia Post Note  Patient: [name]  Procedure(s) Performed: Procedure(s) (LRB): COLONOSCOPY WITH PROPOFOL (N/A)  Patient location during evaluation: Endoscopy Anesthesia Type: General Level of consciousness: awake and alert Pain management: pain level controlled Vital Signs Assessment: post-procedure vital signs reviewed and stable Respiratory status: spontaneous breathing, nonlabored ventilation, respiratory function stable and patient connected to nasal cannula oxygen Cardiovascular status: blood pressure returned to baseline and stable Postop Assessment: no signs of nausea or vomiting Anesthetic complications: no    Last Vitals:  Filed Vitals:   07/26/15 1140 07/26/15 1150  BP: 116/78 122/80  Pulse:    Temp:    Resp:      Last Pain: There were no vitals filed for this visit.               Journey Ratterman S

## 2015-07-26 NOTE — Anesthesia Preprocedure Evaluation (Addendum)
Anesthesia Evaluation  Patient identified by MRN, date of birth, ID band Patient awake    Reviewed: Allergy & Precautions, NPO status , Patient's Chart, lab work & pertinent test results, reviewed documented beta blocker date and time   Airway Mallampati: II  TM Distance: >3 FB     Dental  (+) Chipped, Lower Dentures, Upper Dentures   Pulmonary former smoker,           Cardiovascular hypertension, Pt. on medications and Pt. on home beta blockers +CHF  + dysrhythmias Atrial Fibrillation      Neuro/Psych    GI/Hepatic   Endo/Other  diabetes, Type 2  Renal/GU      Musculoskeletal   Abdominal   Peds  Hematology   Anesthesia Other Findings Obese.  Reproductive/Obstetrics                            Anesthesia Physical Anesthesia Plan  ASA: III  Anesthesia Plan: General   Post-op Pain Management:    Induction: Intravenous  Airway Management Planned: Nasal Cannula  Additional Equipment:   Intra-op Plan:   Post-operative Plan:   Informed Consent: I have reviewed the patients History and Physical, chart, labs and discussed the procedure including the risks, benefits and alternatives for the proposed anesthesia with the patient or authorized representative who has indicated his/her understanding and acceptance.     Plan Discussed with: CRNA  Anesthesia Plan Comments:         Anesthesia Quick Evaluation

## 2015-07-26 NOTE — Anesthesia Procedure Notes (Signed)
Date/Time: 07/26/2015 10:30 AM Performed by: Johnna Acosta Pre-anesthesia Checklist: Patient identified, Emergency Drugs available, Suction available, Patient being monitored and Timeout performed Patient Re-evaluated:Patient Re-evaluated prior to inductionOxygen Delivery Method: Nasal cannula

## 2015-07-26 NOTE — H&P (Signed)
   Primary Care Physician:  Albina Billet, MD Primary Gastroenterologist:  Dr. Vira Agar  Pre-Procedure History & Physical: HPI:  [name] is a 65 y.o. male is here for an colonoscopy.   Past Medical History  Diagnosis Date  . Hypertension   . CHF (congestive heart failure) ([place])   . Diabetes mellitus without complication ([place])   . Serum lipids high   . AF (atrial fibrillation) ([place])   . Cardiomyopathy [facility])     Past Surgical History  Procedure Laterality Date  . Colonoscopy      Prior to Admission medications   Medication Sig Start Date End Date Taking? Authorizing Provider  allopurinol (ZYLOPRIM) 300 MG tablet Take 300 mg by mouth daily.   Yes Historical Provider, MD  atorvastatin (LIPITOR) 10 MG tablet Take 10 mg by mouth daily.   Yes Historical Provider, MD  digoxin (LANOXIN) 0.125 MG tablet Take by mouth daily.   Yes Historical Provider, MD  furosemide (LASIX) 20 MG tablet Take 20 mg by mouth.   Yes Historical Provider, MD  lisinopril (PRINIVIL,ZESTRIL) 5 MG tablet Take 5 mg by mouth daily.   Yes Historical Provider, MD  metFORMIN (GLUCOPHAGE) 500 MG tablet Take by mouth 2 (two) times daily with a meal.   Yes Historical Provider, MD  metoprolol succinate (TOPROL-XL) 50 MG 24 hr tablet Take 50 mg by mouth daily. Take with or immediately following a meal.   Yes Historical Provider, MD  potassium chloride (K-DUR,KLOR-CON) 10 MEQ tablet Take 10 mEq by mouth 2 (two) times daily.   Yes Historical Provider, MD    Allergies as of 07/02/2015  . (Not on File)    History reviewed. No pertinent family history.  Social History   Social History  . Marital Status: Widowed    Spouse Name: N/A  . Number of Children: N/A  . Years of Education: N/A   Occupational History  . Not on file.   Social History Main Topics  . Smoking status: Former Research scientist (life sciences)  . Smokeless tobacco: Current User  . Alcohol Use: No  . Drug Use: No  . Sexual Activity: Not on file   Other Topics Concern   . Not on file   Social History Narrative    Review of Systems: See HPI, otherwise negative ROS  Physical Exam: BP 131/77 mmHg  Pulse 85  Temp(Src) 96.9 F (36.1 C) (Tympanic)  Resp 16  Ht 6\' 2"  (1.88 m)  Wt 112.492 kg (248 lb)  BMI 31.83 kg/m2  SpO2 98% General:   Alert,  pleasant and cooperative in NAD Head:  Normocephalic and atraumatic. Neck:  Supple; no masses or thyromegaly. Lungs:  Clear throughout to auscultation.    Heart:  Regular rate and rhythm. Abdomen:  Soft, nontender and nondistended. Normal bowel sounds, without guarding, and without rebound.   Neurologic:  Alert and  oriented x4;  grossly normal neurologically.  Impression/Plan: [name] is here for an colonoscopy to be performed for [facility] colon polyps  Risks, benefits, limitations, and alternatives regarding  colonoscopy have been reviewed with the patient.  Questions have been answered.  All parties agreeable.   Gaylyn Cheers, MD  07/26/2015, 10:23 AM

## 2015-07-28 ENCOUNTER — Encounter: Payer: Self-pay | Admitting: Unknown Physician Specialty

## 2015-07-29 LAB — SURGICAL PATHOLOGY

## 2015-08-30 ENCOUNTER — Encounter: Payer: Self-pay | Admitting: Sports Medicine

## 2015-08-30 ENCOUNTER — Ambulatory Visit (INDEPENDENT_AMBULATORY_CARE_PROVIDER_SITE_OTHER): Payer: PPO | Admitting: Sports Medicine

## 2015-08-30 ENCOUNTER — Telehealth: Payer: Self-pay | Admitting: *Deleted

## 2015-08-30 DIAGNOSIS — L02619 Cutaneous abscess of unspecified foot: Secondary | ICD-10-CM | POA: Diagnosis not present

## 2015-08-30 DIAGNOSIS — M79671 Pain in right foot: Secondary | ICD-10-CM | POA: Diagnosis not present

## 2015-08-30 DIAGNOSIS — E11621 Type 2 diabetes mellitus with foot ulcer: Secondary | ICD-10-CM | POA: Diagnosis not present

## 2015-08-30 DIAGNOSIS — L03119 Cellulitis of unspecified part of limb: Secondary | ICD-10-CM | POA: Diagnosis not present

## 2015-08-30 DIAGNOSIS — L89891 Pressure ulcer of other site, stage 1: Secondary | ICD-10-CM

## 2015-08-30 DIAGNOSIS — E11628 Type 2 diabetes mellitus with other skin complications: Secondary | ICD-10-CM | POA: Diagnosis not present

## 2015-08-30 DIAGNOSIS — L97519 Non-pressure chronic ulcer of other part of right foot with unspecified severity: Principal | ICD-10-CM

## 2015-08-30 DIAGNOSIS — E1142 Type 2 diabetes mellitus with diabetic polyneuropathy: Secondary | ICD-10-CM | POA: Diagnosis not present

## 2015-08-30 DIAGNOSIS — I739 Peripheral vascular disease, unspecified: Secondary | ICD-10-CM

## 2015-08-30 MED ORDER — MUPIROCIN 2 % EX OINT: TOPICAL_OINTMENT | CUTANEOUS | 1 refills | Status: DC

## 2015-08-30 MED ORDER — MUPIROCIN CALCIUM 2 % EX CREA: 1.0000 "application " | TOPICAL_CREAM | Freq: Every day | CUTANEOUS | 1 refills | Status: DC

## 2015-08-30 NOTE — Progress Notes (Signed)
Subjective: [name] is a 65 y.o. male patient seen in office for evaluation of bloody callus right foot. Patient has a history of diabetes and a blood glucose level  today not checked.   Patient is assisted by girlfriend and states that this has been going on for months with drainage noticed on sock. Denies nausea/fever/vomiting/chills/night sweats/shortness of breath/pain. Patient has no other pedal complaints at this time.  Patient Active Problem List   Diagnosis Date Noted  . History of adenomatous polyp of colon 06/03/2015  . History of thrombocytopenia 06/03/2015  . Atrial fibrillation ([place]) 04/28/2013  . CHF (congestive heart failure) ([place]) 04/28/2013  . 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 Left ventricular dysfunction 04/28/2013   Current Outpatient Prescriptions on File Prior to Visit  Medication Sig Dispense Refill  . allopurinol (ZYLOPRIM) 300 MG tablet Take 300 mg by mouth daily.    [name] atorvastatin (LIPITOR) 10 MG tablet Take 10 mg by mouth daily.    . digoxin (LANOXIN) 0.125 MG tablet Take by mouth daily.    . furosemide (LASIX) 20 MG tablet Take 20 mg by mouth.    [name] lisinopril (PRINIVIL,ZESTRIL) 5 MG tablet Take 5 mg by mouth daily.    . metFORMIN (GLUCOPHAGE) 500 MG tablet Take by mouth 2 (two) times daily with a meal.    . metoprolol succinate (TOPROL-XL) 50 MG 24 hr tablet Take 50 mg by mouth daily. Take with or immediately following a meal.    . potassium chloride (K-DUR,KLOR-CON) 10 MEQ tablet Take 10 mEq by mouth 2 (two) times daily.     No current facility-administered medications on file prior to visit.    Allergies  Allergen Reactions  . Penicillins Hives    Recent Results (from the past 2160 hour(s))  Glucose, capillary     Status: Abnormal   Collection Time: 07/26/15  9:31 AM  Result Value Ref Range   Glucose-Capillary 132 (H) 65 - 99 mg/dL   Comment 1 IN EPIC   CBC     Status: Abnormal   Collection Time: 07/26/15 10:02 AM  Result Value Ref  Range   WBC 5.9 3.8 - 10.6 K/uL   RBC 4.47 4.40 - 5.90 MIL/uL   Hemoglobin 15.0 13.0 - 18.0 g/dL   HCT 43.6 40.0 - 52.0 %   MCV 97.5 80.0 - 100.0 fL   MCH 33.6 26.0 - 34.0 pg   MCHC 34.4 32.0 - 36.0 g/dL   RDW 16.2 (H) 11.5 - 14.5 %   Platelets 106 (L) 150 - 440 K/uL  Surgical pathology     Status: None   Collection Time: 07/26/15 10:43 AM  Result Value Ref Range   SURGICAL PATHOLOGY      Surgical Pathology CASE: [id number] PATIENT: Eaton Brzoska Surgical Pathology Report     SPECIMEN SUBMITTED: A. Colon polyp x2, cecum; cbx B. Colon polyp, ascending; cbx C. Colon polyp, descending; hot snare  CLINICAL HISTORY: None provided  PRE-OPERATIVE DIAGNOSIS: HX ADEN polyps  POST-OPERATIVE DIAGNOSIS: Colon polyps     DIAGNOSIS: A. COLON POLYP 2, CECUM; COLD BIOPSY: - TUBULAR ADENOMA. - NEGATIVE FOR HIGH-GRADE DYSPLASIA AND MALIGNANCY.  B. COLON POLYP, ASCENDING; COLD BIOPSY: - TUBULAR ADENOMA. - NEGATIVE FOR HIGH-GRADE DYSPLASIA AND MALIGNANCY.  C. COLON POLYP, DESCENDING; HOT SNARE: - TUBULAR ADENOMA. - NEGATIVE FOR HIGH-GRADE DYSPLASIA AND MALIGNANCY.   GROSS DESCRIPTION:  A. Labeled: cecum polyp times 2C BX  Tissue fragment(s): 3  Size: 0.2-0.4 cm  Description: tan  Entirely submitted in 1  cassette(s).   B. Labeled: ascending colon polyp C BX  Tissue fragment(s): 1  Size: 0.3 cm  Description: pink  Entirely submit ted in 1 cassette(s).  C. Labeled: descending colon polyp hot snare  Tissue fragment(s): 1  Size: 0.6 cm  Description: pink polypoid fragment, inked blue  Entirely submitted in 1 cassette(s).  Final Diagnosis performed by Delorse Lek, MD.  Electronically signed 07/29/2015 10:31:55AM    The electronic signature indicates that the named Attending Pathologist has evaluated the specimen  Technical component performed at [facility], [address], [place], [place] [address] Lab: [phone] Dir: Darrick Penna. Evette Doffing,  MD  Professional component performed at [facility], [place], [place], [place] [address] Lab: [phone] Dir: Dellia Nims. Reuel Derby, MD      Objective: There were no vitals filed for this visit.  General: Patient is awake, alert, oriented x 3 and in no acute distress.  Dermatology: Skin is warm and dry bilateral with a partial thickness ulceration present ball of right foot sub met 2. Ulceration measures 2cm x 2 cm x 0.3 cm. There is a  Keratotic border with a granular base. The ulceration does not probe to bone. There is mild malodor, no active drainage, no erythema, no edema. No other acute signs of infection.   Vascular: Dorsalis Pedis pulse = 1/4 Bilateral,  Posterior Tibial pulse = 1/4 Bilateral,  Capillary Fill Time < 5 seconds, + varicosities bilateral.   Neurologic: Protective sensation diminished bilateral using 5.07/10g Semmes Weinstein Monofilament.  Musculosketal: Hammertoe deformity bilateral. No Pain with palpation to ulcerated area. No pain with compression to calves bilateral.   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 - Excisionally dedbrided ulceration to healthy bleeding borders using a sterile chisel  Blade. -Wound culture obtained, will call patient with results and start PO antibiotics based on results -Applied Iodosorb, offloading pad, and  dry sterile dressing and instructed patient to continue with daily dressings at home consisting of bactroban as Rx and offloading pad and bandaid/dry sterile dressing. - Advised patient to go to the ER or return to office if the wound worsens or if constitutional symptoms are present. -Patient to return to office in 2 weeks for follow up care and evaluation or sooner if problems arise.  Landis Martins, DPM

## 2015-08-30 NOTE — Telephone Encounter (Signed)
[name] states mupirocin cream is very expensive and difficult to acquire, can they switch to the ointment.  Dr. Cannon Kettle states can change.

## 2015-09-06 ENCOUNTER — Other Ambulatory Visit: Payer: Self-pay | Admitting: Sports Medicine

## 2015-09-06 MED ORDER — SULFAMETHOXAZOLE-TRIMETHOPRIM 800-160 MG PO TABS: 1.0000 | ORAL_TABLET | Freq: Two times a day (BID) | ORAL | 0 refills | Status: DC

## 2015-09-06 NOTE — Telephone Encounter (Addendum)
-----   Message from Landis Martins, [place] sent at 09/06/2015  7:17 AM EDT ----- Regarding: Culture results Can you let patient know culture came back + for Staph Aureus not MRSA and send to his pharmacy Bactrim 800/160 bid x 14 days Thanks Dr. Cannon Kettle. I informed pt of the antibiotic and culture results. Pt states he will pick up the rx tonight or tomorrow.

## 2015-09-13 ENCOUNTER — Encounter: Payer: Self-pay | Admitting: Sports Medicine

## 2015-09-13 ENCOUNTER — Ambulatory Visit (INDEPENDENT_AMBULATORY_CARE_PROVIDER_SITE_OTHER): Payer: BLUE CROSS/BLUE SHIELD | Admitting: Sports Medicine

## 2015-09-13 DIAGNOSIS — L03119 Cellulitis of unspecified part of limb: Secondary | ICD-10-CM

## 2015-09-13 DIAGNOSIS — M79671 Pain in right foot: Secondary | ICD-10-CM

## 2015-09-13 DIAGNOSIS — E11621 Type 2 diabetes mellitus with foot ulcer: Secondary | ICD-10-CM

## 2015-09-13 DIAGNOSIS — L02619 Cutaneous abscess of unspecified foot: Secondary | ICD-10-CM

## 2015-09-13 DIAGNOSIS — L97519 Non-pressure chronic ulcer of other part of right foot with unspecified severity: Principal | ICD-10-CM

## 2015-09-13 DIAGNOSIS — L89891 Pressure ulcer of other site, stage 1: Secondary | ICD-10-CM | POA: Diagnosis not present

## 2015-09-14 NOTE — Progress Notes (Signed)
Subjective: [name] is a 65 y.o. male patient seen in office for evaluation of right foot ulceration. Patient on bactrim with no issues and has been dressing daily with bactroban. Patient has a history of diabetes and a blood glucose level today not checked.. Denies nausea/fever/vomiting/chills/night sweats/shortness of breath/pain. Patient has no other pedal complaints at this time.  Patient Active Problem List   Diagnosis Date Noted  . History of adenomatous polyp of colon 06/03/2015  . History of thrombocytopenia 06/03/2015  . Atrial fibrillation ([place]) 04/28/2013  . CHF (congestive heart failure) ([place]) 04/28/2013  . 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 Left ventricular dysfunction 04/28/2013   Current Outpatient Prescriptions on File Prior to Visit  Medication Sig Dispense Refill  . allopurinol (ZYLOPRIM) 300 MG tablet Take 300 mg by mouth daily.    [name] atorvastatin (LIPITOR) 10 MG tablet Take 10 mg by mouth daily.    . digoxin (LANOXIN) 0.125 MG tablet Take by mouth daily.    . furosemide (LASIX) 20 MG tablet Take 20 mg by mouth.    [name] lisinopril (PRINIVIL,ZESTRIL) 5 MG tablet Take 5 mg by mouth daily.    . metFORMIN (GLUCOPHAGE) 500 MG tablet Take by mouth 2 (two) times daily with a meal.    . metoprolol succinate (TOPROL-XL) 50 MG 24 hr tablet Take 50 mg by mouth daily. Take with or immediately following a meal.    . mupirocin ointment (BACTROBAN) 2 % Apply to foot ulcer daily. 22 g 1  . potassium chloride (K-DUR,KLOR-CON) 10 MEQ tablet Take 10 mEq by mouth 2 (two) times daily.    [name] sulfamethoxazole-trimethoprim (BACTRIM DS,SEPTRA DS) 800-160 MG tablet Take 1 tablet by mouth 2 (two) times daily. 28 tablet 0   No current facility-administered medications on file prior to visit.    Allergies  Allergen Reactions  . Penicillins Hives    Recent Results (from the past 2160 hour(s))  Glucose, capillary     Status: Abnormal   Collection Time: 07/26/15  9:31 AM  Result Value  Ref Range   Glucose-Capillary 132 (H) 65 - 99 mg/dL   Comment 1 IN EPIC   CBC     Status: Abnormal   Collection Time: 07/26/15 10:02 AM  Result Value Ref Range   WBC 5.9 3.8 - 10.6 K/uL   RBC 4.47 4.40 - 5.90 MIL/uL   Hemoglobin 15.0 13.0 - 18.0 g/dL   HCT 43.6 40.0 - 52.0 %   MCV 97.5 80.0 - 100.0 fL   MCH 33.6 26.0 - 34.0 pg   MCHC 34.4 32.0 - 36.0 g/dL   RDW 16.2 (H) 11.5 - 14.5 %   Platelets 106 (L) 150 - 440 K/uL  Surgical pathology     Status: None   Collection Time: 07/26/15 10:43 AM  Result Value Ref Range   SURGICAL PATHOLOGY      Surgical Pathology CASE: [id number] PATIENT: Nathanel Dimare Surgical Pathology Report     SPECIMEN SUBMITTED: A. Colon polyp x2, cecum; cbx B. Colon polyp, ascending; cbx C. Colon polyp, descending; hot snare  CLINICAL HISTORY: None provided  PRE-OPERATIVE DIAGNOSIS: HX ADEN polyps  POST-OPERATIVE DIAGNOSIS: Colon polyps     DIAGNOSIS: A. COLON POLYP 2, CECUM; COLD BIOPSY: - TUBULAR ADENOMA. - NEGATIVE FOR HIGH-GRADE DYSPLASIA AND MALIGNANCY.  B. COLON POLYP, ASCENDING; COLD BIOPSY: - TUBULAR ADENOMA. - NEGATIVE FOR HIGH-GRADE DYSPLASIA AND MALIGNANCY.  C. COLON POLYP, DESCENDING; HOT SNARE: - TUBULAR ADENOMA. - NEGATIVE FOR HIGH-GRADE DYSPLASIA AND MALIGNANCY.   GROSS  DESCRIPTION:  A. Labeled: cecum polyp times 2C BX  Tissue fragment(s): 3  Size: 0.2-0.4 cm  Description: tan  Entirely submitted in 1 cassette(s).   B. Labeled: ascending colon polyp C BX  Tissue fragment(s): 1  Size: 0.3 cm  Description: pink  Entirely submit ted in 1 cassette(s).  C. Labeled: descending colon polyp hot snare  Tissue fragment(s): 1  Size: 0.6 cm  Description: pink polypoid fragment, inked blue  Entirely submitted in 1 cassette(s).  Final Diagnosis performed by Delorse Lek, MD.  Electronically signed 07/29/2015 10:31:55AM    The electronic signature indicates that the named Attending Pathologist has  evaluated the specimen  Technical component performed at [facility], [address] [place], [place] [address] Lab: [phone] Dir: Darrick Penna. Evette Doffing, MD  Professional component performed at [facility], [facility], [place], [place], [place] [address] Lab: [phone] Dir: Dellia Nims. Reuel Derby, MD      Objective: There were no vitals filed for this visit.  General: Patient is awake, alert, oriented x 3 and in no acute distress.  Dermatology: Skin is warm and dry bilateral with a partial thickness ulceration present ball of right foot sub met 2. Ulceration measures 0.5x0.3x0.3cm (last measurement 2cm x 2 cm x 0.3 cm). There is a  Keratotic border with a granular base. The ulceration does not probe to bone. There is no malodor, no active drainage, no erythema, no edema. No other acute signs of infection.   Vascular: Dorsalis Pedis pulse = 1/4 Bilateral,  Posterior Tibial pulse = 1/4 Bilateral,  Capillary Fill Time < 5 seconds, + varicosities bilateral.   Neurologic: Protective sensation diminished bilateral using 5.07/10g Semmes Weinstein Monofilament.  Musculosketal: Hammertoe deformity bilateral. No Pain with palpation to ulcerated area. No pain with compression to calves bilateral.   Assessment and Plan:  Problem List Items Addressed This Visit    None    Visit Diagnoses    Type 2 diabetes mellitus with right diabetic foot ulcer ([place])    -  Primary   Right foot pain       Cellulitis and abscess of foot, except toes         -Examined patient and discussed the progression of the wound and treatment alternatives. - Excisionally dedbrided ulceration to healthy bleeding borders using a sterile chisel  Blade. -Continue with bactrim until completed -Applied silvadene cream, offloading pad, and dry sterile dressing and instructed patient to continue with daily dressings at home consisting of bactroban and offloading pad and bandaid/dry sterile dressing until  healed. - Advised patient to go to the ER or return to office if the wound worsens or if constitutional symptoms are present. -Patient to return to office in 3 weeks for follow up care/ulcer check or sooner if problems arise.  Landis Martins, DPM

## 2015-10-04 ENCOUNTER — Ambulatory Visit (INDEPENDENT_AMBULATORY_CARE_PROVIDER_SITE_OTHER): Payer: BLUE CROSS/BLUE SHIELD | Admitting: Podiatry

## 2015-10-04 ENCOUNTER — Encounter: Payer: Self-pay | Admitting: Podiatry

## 2015-10-04 VITALS — BP 138/75 | HR 68 | Resp 16

## 2015-10-04 DIAGNOSIS — L84 Corns and callosities: Secondary | ICD-10-CM

## 2015-10-04 DIAGNOSIS — Q828 Other specified congenital malformations of skin: Secondary | ICD-10-CM | POA: Diagnosis not present

## 2015-10-04 DIAGNOSIS — E11621 Type 2 diabetes mellitus with foot ulcer: Secondary | ICD-10-CM

## 2015-10-04 DIAGNOSIS — L97519 Non-pressure chronic ulcer of other part of right foot with unspecified severity: Secondary | ICD-10-CM

## 2015-10-04 DIAGNOSIS — E1142 Type 2 diabetes mellitus with diabetic polyneuropathy: Secondary | ICD-10-CM

## 2015-10-04 DIAGNOSIS — M79671 Pain in right foot: Secondary | ICD-10-CM

## 2015-10-04 NOTE — Patient Instructions (Signed)
Diabetes and Foot Care Diabetes may cause you to have problems because of poor blood supply (circulation) to your feet and legs. This may cause the skin on your feet to become thinner, break easier, and heal more slowly. Your skin may become dry, and the skin may peel and crack. You may also have nerve damage in your legs and feet causing decreased feeling in them. You may not notice minor injuries to your feet that could lead to infections or more serious problems. Taking care of your feet is one of the most important things you can do for yourself.  HOME CARE INSTRUCTIONS  Wear shoes at all times, even in the house. Do not go barefoot. Bare feet are easily injured.  Check your feet daily for blisters, cuts, and redness. If you cannot see the bottom of your feet, use a mirror or ask someone for help.  Wash your feet with warm water (do not use hot water) and mild soap. Then pat your feet and the areas between your toes until they are completely dry. Do not soak your feet as this can dry your skin.  Apply a moisturizing lotion or petroleum jelly (that does not contain alcohol and is unscented) to the skin on your feet and to dry, brittle toenails. Do not apply lotion between your toes.  Trim your toenails straight across. Do not dig under them or around the cuticle. File the edges of your nails with an emery board or nail file.  Do not cut corns or calluses or try to remove them with medicine.  Wear clean socks or stockings every day. Make sure they are not too tight. Do not wear knee-high stockings since they may decrease blood flow to your legs.  Wear shoes that fit properly and have enough cushioning. To break in new shoes, wear them for just a few hours a day. This prevents you from injuring your feet. Always look in your shoes before you put them on to be sure there are no objects inside.  Do not cross your legs. This may decrease the blood flow to your feet.  If you find a minor scrape,  cut, or break in the skin on your feet, keep it and the skin around it clean and dry. These areas may be cleansed with mild soap and water. Do not cleanse the area with peroxide, alcohol, or iodine.  When you remove an adhesive bandage, be sure not to damage the skin around it.  If you have a wound, look at it several times a day to make sure it is healing.  Do not use heating pads or hot water bottles. They may burn your skin. If you have lost feeling in your feet or legs, you may not know it is happening until it is too late.  Make sure your health care provider performs a complete foot exam at least annually or more often if you have foot problems. Report any cuts, sores, or bruises to your health care provider immediately. SEEK MEDICAL CARE IF:   You have an injury that is not healing.  You have cuts or breaks in the skin.  You have an ingrown nail.  You notice redness on your legs or feet.  You feel burning or tingling in your legs or feet.  You have pain or cramps in your legs and feet.  Your legs or feet are numb.  Your feet always feel cold. SEEK IMMEDIATE MEDICAL CARE IF:   There is increasing redness,   swelling, or pain in or around a wound.  There is a red line that goes up your leg.  Pus is coming from a wound.  You develop a fever or as directed by your health care provider.  You notice a bad smell coming from an ulcer or wound.   This information is not intended to replace advice given to you by your health care provider. Make sure you discuss any questions you have with your health care provider.   Document Released: 01/03/2000 Document Revised: 09/07/2012 Document Reviewed: 06/14/2012 Elsevier Interactive Patient Education 2016 [employer]  

## 2015-10-07 NOTE — Progress Notes (Signed)
Subjective: [name] is a 65 y.o. male patient seen in office for evaluation of right foot painful callus.  Patient has a history of diabetes and a blood glucose level today not checked.. Denies nausea/fever/vomiting/chills/night sweats/shortness of breath/pain. Patient has no other pedal complaints at this time.  Objective: General: Patient is awake, alert, oriented x 3 and in no acute distress.  Dermatology: Skin is warm and dry bilateral with a hyperkeratotic callus lesion present ball of right foot sub met 2.  There is no malodor, no active drainage, no erythema, no edema. No other acute signs of infection.   Vascular: Dorsalis Pedis pulse = 1/4 Bilateral,  Posterior Tibial pulse = 1/4 Bilateral,  Capillary Fill Time < 5 seconds, + varicosities bilateral.   Neurologic: Protective sensation diminished bilateral using 5.07/10g Semmes Weinstein Monofilament.  Musculosketal: Hammertoe deformity bilateral. No Pain with palpation to ulcerated area. No pain with compression to calves bilateral.   Assessment and Plan:  #1 callus lesion right sub-second metatarsal phalangeal joint forefoot #2 pain in right foot #3 diabetes mellitus  Problem List Items Addressed This Visit    None    Visit Diagnoses   None.    -Examined patient and discussed the progression of the wound and treatment alternatives. - Excisionally dedbrided of hyperkeratotic callus lesion was performed using  a sterile chisel  Blade. -Applied silvadene cream, offloading pad, and dry sterile dressing and instructed patient to continue with daily dressings at home consisting of bactroban and offloading pad and bandaid/dry sterile dressing until healed. - Advised patient to go to the ER or return to office if the wound worsens or if constitutional symptoms are present. -Patient to return to officeon an as-needed basis  or sooner if problems arise.  Edrick Kins, DPM

## 2015-11-15 DIAGNOSIS — I428 Other cardiomyopathies: Secondary | ICD-10-CM | POA: Diagnosis not present

## 2015-11-15 DIAGNOSIS — I480 Paroxysmal atrial fibrillation: Secondary | ICD-10-CM | POA: Diagnosis not present

## 2015-11-15 DIAGNOSIS — I5022 Chronic systolic (congestive) heart failure: Secondary | ICD-10-CM | POA: Diagnosis not present

## 2015-11-15 DIAGNOSIS — I519 Heart disease, unspecified: Secondary | ICD-10-CM | POA: Diagnosis not present

## 2015-12-09 DIAGNOSIS — I5022 Chronic systolic (congestive) heart failure: Secondary | ICD-10-CM | POA: Diagnosis not present

## 2015-12-09 DIAGNOSIS — I428 Other cardiomyopathies: Secondary | ICD-10-CM | POA: Diagnosis not present

## 2015-12-09 DIAGNOSIS — I519 Heart disease, unspecified: Secondary | ICD-10-CM | POA: Diagnosis not present

## 2015-12-25 DIAGNOSIS — R21 Rash and other nonspecific skin eruption: Secondary | ICD-10-CM | POA: Diagnosis not present

## 2015-12-31 ENCOUNTER — Ambulatory Visit (INDEPENDENT_AMBULATORY_CARE_PROVIDER_SITE_OTHER): Payer: PPO | Admitting: Podiatry

## 2015-12-31 ENCOUNTER — Encounter: Payer: Self-pay | Admitting: Podiatry

## 2015-12-31 DIAGNOSIS — L851 Acquired keratosis [keratoderma] palmaris et plantaris: Secondary | ICD-10-CM | POA: Diagnosis not present

## 2015-12-31 DIAGNOSIS — B354 Tinea corporis: Secondary | ICD-10-CM | POA: Diagnosis not present

## 2015-12-31 DIAGNOSIS — M79671 Pain in right foot: Secondary | ICD-10-CM

## 2015-12-31 DIAGNOSIS — L84 Corns and callosities: Secondary | ICD-10-CM | POA: Diagnosis not present

## 2015-12-31 DIAGNOSIS — M79672 Pain in left foot: Secondary | ICD-10-CM

## 2015-12-31 NOTE — Progress Notes (Signed)
Subjective: [name] is a 65 y.o. male patient seen in office for evaluation of right foot painful callus.  Patient has a history of diabetes and a blood glucose level today not checked.. Denies nausea/fever/vomiting/chills/night sweats/shortness of breath/pain. Patient has no other pedal complaints at this time.  Objective: General: Patient is awake, alert, oriented x 3 and in no acute distress.  Dermatology: Skin is warm and dry bilateral with a hyperkeratotic callus lesion present ball of right foot sub met 2.  There is no malodor, no active drainage, no erythema, no edema. No other acute signs of infection.   Vascular: Dorsalis Pedis pulse = 1/4 Bilateral,  Posterior Tibial pulse = 1/4 Bilateral,  Capillary Fill Time < 5 seconds, + varicosities bilateral.   Neurologic: Protective sensation diminished bilateral using 5.07/10g Semmes Weinstein Monofilament.  Musculosketal: Hammertoe deformity bilateral. No Pain with palpation to ulcerated area. No pain with compression to calves bilateral.   Assessment and Plan:  #1 callus lesion right sub-second metatarsal phalangeal joint forefoot #2 pain in right foot #3 diabetes mellitus  Problem List Items Addressed This Visit    None     -Examined patient and discussed the progression of the wound and treatment alternatives. - Excisionally dedbrided of hyperkeratotic callus lesion was performed using  a sterile chisel  Blade. -Applied silvadene cream, offloading pad, and dry sterile dressing and instructed patient to continue with daily dressings at home consisting of bactroban and offloading pad and bandaid/dry sterile dressing until healed. - Advised patient to go to the ER or return to office if the wound worsens or if constitutional symptoms are present. -Patient to return to officeon an as-needed basis  or sooner if problems arise.  Brent M. Evans, DPM 

## 2016-01-15 DIAGNOSIS — B354 Tinea corporis: Secondary | ICD-10-CM | POA: Diagnosis not present

## 2016-02-14 DIAGNOSIS — E785 Hyperlipidemia, unspecified: Secondary | ICD-10-CM | POA: Diagnosis not present

## 2016-02-14 DIAGNOSIS — I10 Essential (primary) hypertension: Secondary | ICD-10-CM | POA: Diagnosis not present

## 2016-02-14 DIAGNOSIS — E119 Type 2 diabetes mellitus without complications: Secondary | ICD-10-CM | POA: Diagnosis not present

## 2016-02-21 DIAGNOSIS — I10 Essential (primary) hypertension: Secondary | ICD-10-CM | POA: Diagnosis not present

## 2016-02-21 DIAGNOSIS — E038 Other specified hypothyroidism: Secondary | ICD-10-CM | POA: Diagnosis not present

## 2016-02-21 DIAGNOSIS — E785 Hyperlipidemia, unspecified: Secondary | ICD-10-CM | POA: Diagnosis not present

## 2016-02-21 DIAGNOSIS — E119 Type 2 diabetes mellitus without complications: Secondary | ICD-10-CM | POA: Diagnosis not present

## 2016-03-06 DIAGNOSIS — I10 Essential (primary) hypertension: Secondary | ICD-10-CM | POA: Diagnosis not present

## 2016-03-16 ENCOUNTER — Encounter: Payer: Self-pay | Admitting: Podiatry

## 2016-03-16 ENCOUNTER — Ambulatory Visit (INDEPENDENT_AMBULATORY_CARE_PROVIDER_SITE_OTHER): Payer: PPO | Admitting: Podiatry

## 2016-03-16 DIAGNOSIS — E1142 Type 2 diabetes mellitus with diabetic polyneuropathy: Secondary | ICD-10-CM

## 2016-03-16 DIAGNOSIS — L97512 Non-pressure chronic ulcer of other part of right foot with fat layer exposed: Secondary | ICD-10-CM

## 2016-03-16 DIAGNOSIS — M2041 Other hammer toe(s) (acquired), right foot: Secondary | ICD-10-CM

## 2016-03-16 DIAGNOSIS — L84 Corns and callosities: Secondary | ICD-10-CM

## 2016-03-16 NOTE — Progress Notes (Signed)
This patient presents to the office with chief complaint of a painful callus under the ball of his right foot. He says that is painful as he walks and wears his shoes. He gives a history of having an infected ulcer treated by Dr. Cannon Kettle months ago. He then was seen by Dr. Amalia Hailey who debrided the callus and told him to return in 2 months.  He returns today for treatment of this callus under the ball of his right foot  GENERAL APPEARANCE: Alert, conversant. Appropriately groomed. No acute distress.  VASCULAR: Pedal pulses are  palpable at  [facility] and PT bilateral.  Capillary refill time is immediate to all digits,  Normal temperature gradient.  Digital hair growth is present bilateral  NEUROLOGIC: sensation is diminished to 5.07 monofilament at 5/5 sites bilateral.  Light touch is intact bilateral, Muscle strength normal.  MUSCULOSKELETAL: acceptable muscle strength, tone and stability bilateral.  Intrinsic muscluature intact bilateral.  Rectus appearance of foot and digits noted bilateral. Severe hammer toe/mallet toe second right foot.  DERMATOLOGIC: skin color, texture, and turgor are within normal limits except for callus right foot.  Patient has a pre-ulcerous callus noted under the second MPJ of the right foot. After debridement. It was noted that he had a 20 x 20 mm ulcer.  No evidence of any swelling, redness or drainage noted at this area. There is normal skin encircling the ulcer on the right forefoot. No malodor noted  Diabetic ulcer right forefoot  ROV  Debride necrotic tissue.  No infection or drainage noted. Bandaged with silvadene and DSD.  Orthowedge shoe was dispensed.  Home instruction for soaks given.  If this condition worsens or becomes very painful, the patient was told to contact this office or go to the Emergency Department at the hospital.  Gardiner Barefoot DPM

## 2016-03-16 NOTE — Progress Notes (Signed)
This patient presents to the office with chief complaint of a painful callus under the ball of his right foot. He says that is painful as he walks and wears his shoes. He gives a history of having an infected ulcer treated by Dr. Cannon Kettle months ago. He then was seen by Dr. Amalia Hailey who debrided the callus and told him to return in 2 months.  He returns today for treatment of this callus under the ball of his right foot  GENERAL APPEARANCE: Alert, conversant. Appropriately groomed. No acute distress.  VASCULAR: Pedal pulses are  palpable at  [facility] and PT bilateral.  Capillary refill time is immediate to all digits,  Normal temperature gradient.  Digital hair growth is present bilateral  NEUROLOGIC: sensation is diminished to 5.07 monofilament at 5/5 sites bilateral.  Light touch is intact bilateral, Muscle strength normal.  MUSCULOSKELETAL: acceptable muscle strength, tone and stability bilateral.  Intrinsic muscluature intact bilateral.  Rectus appearance of foot and digits noted bilateral. Severe hammer toe/mallet toe second right foot.  DERMATOLOGIC: skin color, texture, and turgor are within normal limits except for callus right foot.  Patient has a pre-ulcerous callus noted under the second MPJ of the right foot. After debridement. It was noted that he had a 20 x 20 mm ulcer.  No evidence of any swelling, redness or drainage noted at this area. There is normal skin encircling the ulcer on the right forefoot. No malodor noted  Diabetic ulcer right forefoot  ROV  Debride necrotic tissue.  No infection or drainage noted. Bandaged with silvadene and DSD.  Orthowedge shoe was dispensed.  Home instruction for soaks given.  If this condition worsens or becomes very painful, the patient was told to contact this office or go to the Emergency Department at the hospital.  Gardiner Barefoot DPM

## 2016-03-30 ENCOUNTER — Ambulatory Visit (INDEPENDENT_AMBULATORY_CARE_PROVIDER_SITE_OTHER): Payer: PPO | Admitting: Podiatry

## 2016-03-30 ENCOUNTER — Encounter: Payer: Self-pay | Admitting: Podiatry

## 2016-03-30 DIAGNOSIS — L97512 Non-pressure chronic ulcer of other part of right foot with fat layer exposed: Secondary | ICD-10-CM

## 2016-03-30 DIAGNOSIS — E1142 Type 2 diabetes mellitus with diabetic polyneuropathy: Secondary | ICD-10-CM

## 2016-03-30 NOTE — Progress Notes (Signed)
This patient presents to the office for diagnosis of a diabetic ulcer right forefoot. She was treated with Silvadene dry sterile dressing in the office for which she was also dispensed resents the office today stating he's not having any pain, discomfort in the drainage has been present for weeks. He is very pleased with his progress at this time. He presents the office for continued evaluation of diabetic ulcer  GENERAL APPEARANCE: Alert, conversant. Appropriately groomed. No acute distress.  VASCULAR: Pedal pulses are  palpable at  [facility] and PT bilateral.  Capillary refill time is immediate to all digits,  Normal temperature gradient.  Digital hair growth is present bilateral  NEUROLOGIC: sensation is diminished to 5.07 monofilament at 5/5 sites bilateral.  Light touch is intact bilateral, Muscle strength normal.  MUSCULOSKELETAL: acceptable muscle strength, tone and stability bilateral.  Intrinsic muscluature intact bilateral.  Rectus appearance of foot and digits noted bilateral. Severe hammer toe/mallet toe second right foot.  DERMATOLOGIC: skin color, texture, and turgor are within normal limits except for callus right foot.  Patient has a pre-ulcerous callus noted under the second MPJ of the right foot. After debridement. It was noted that there was healing of the ulcer.  No evidence of any swelling, redness or drainage noted at this area. There is normal skin encircling the ulcer on the right forefoot. No malodor noted  Diabetic ulcer right forefoot  ROV  Debride necrotic tissue.   Padding was reapplied to his right forefoot.  Patient was told to d/c soaks and silvadene usage.  RTC 4 weeks.  Gardiner Barefoot DPM

## 2016-04-27 ENCOUNTER — Ambulatory Visit (INDEPENDENT_AMBULATORY_CARE_PROVIDER_SITE_OTHER): Payer: PPO | Admitting: Podiatry

## 2016-04-27 ENCOUNTER — Encounter: Payer: Self-pay | Admitting: Podiatry

## 2016-04-27 DIAGNOSIS — L84 Corns and callosities: Secondary | ICD-10-CM

## 2016-04-27 NOTE — Progress Notes (Signed)
This patient presents to the office for diagnosis of a diabetic ulcer right forefoot. She was treated with Silvadene dry sterile dressing in the office for which she was also dispensed resents the office today stating he's not having any pain, discomfort in the drainage has been present for weeks. He is very pleased with his progress at this time. He presents the office for continued evaluation of diabetic ulcer  GENERAL APPEARANCE: Alert, conversant. Appropriately groomed. No acute distress.  VASCULAR: Pedal pulses are  palpable at  [facility] and PT bilateral.  Capillary refill time is immediate to all digits,  Normal temperature gradient.  Digital hair growth is present bilateral  NEUROLOGIC: sensation is diminished to 5.07 monofilament at 5/5 sites bilateral.  Light touch is intact bilateral, Muscle strength normal.  MUSCULOSKELETAL: acceptable muscle strength, tone and stability bilateral.  Intrinsic muscluature intact bilateral.  Rectus appearance of foot and digits noted bilateral. Severe hammer toe/mallet toe second right foot. Hallux malleus hallux right foot.  DERMATOLOGIC: skin color, texture, and turgor are within normal limits except for callus right foot.  Patient has a pre-ulcerous callus noted under the second MPJ of the right foot. After debridement. It was noted that there was healing of the ulcer.  No evidence of any swelling, redness or drainage noted at this area. There is normal skin encircling the ulcer on the right forefoot. No malodor noted  Diabetic ulcer right forefoot  ROV  Debride necrotic tissue.   Initiate diabetic shoe paperwork.  I will send paperwork to medical doctor to inquire if he fits the guidelines for diabetic shoes.  RTC 4 weeks.  Gardiner Barefoot DPM

## 2016-05-14 DIAGNOSIS — Z7901 Long term (current) use of anticoagulants: Secondary | ICD-10-CM | POA: Diagnosis not present

## 2016-05-14 DIAGNOSIS — D689 Coagulation defect, unspecified: Secondary | ICD-10-CM | POA: Diagnosis not present

## 2016-05-14 DIAGNOSIS — I480 Paroxysmal atrial fibrillation: Secondary | ICD-10-CM | POA: Diagnosis not present

## 2016-05-14 DIAGNOSIS — I519 Heart disease, unspecified: Secondary | ICD-10-CM | POA: Diagnosis not present

## 2016-05-14 DIAGNOSIS — I428 Other cardiomyopathies: Secondary | ICD-10-CM | POA: Diagnosis not present

## 2016-05-25 ENCOUNTER — Ambulatory Visit (INDEPENDENT_AMBULATORY_CARE_PROVIDER_SITE_OTHER): Payer: PPO | Admitting: Podiatry

## 2016-05-25 DIAGNOSIS — E11621 Type 2 diabetes mellitus with foot ulcer: Secondary | ICD-10-CM

## 2016-05-25 DIAGNOSIS — L97519 Non-pressure chronic ulcer of other part of right foot with unspecified severity: Secondary | ICD-10-CM

## 2016-05-25 DIAGNOSIS — L97512 Non-pressure chronic ulcer of other part of right foot with fat layer exposed: Secondary | ICD-10-CM

## 2016-05-25 DIAGNOSIS — L84 Corns and callosities: Secondary | ICD-10-CM | POA: Diagnosis not present

## 2016-05-25 NOTE — Progress Notes (Signed)
This patient presents to the office for diagnosis of a diabetic ulcer right forefoot. He presents to the office saying his callus has grown thick and the foot is painful walking.  He says he has been active.  He denies drainage from the pre-ulcerous callus.  He presents the office for continued evaluation of diabetic ulcer  GENERAL APPEARANCE: Alert, conversant. Appropriately groomed. No acute distress.  VASCULAR: Pedal pulses are  palpable at  [facility] and PT bilateral.  Capillary refill time is immediate to all digits,  Normal temperature gradient.  Digital hair growth is present bilateral  NEUROLOGIC: sensation is diminished to 5.07 monofilament at 5/5 sites bilateral.  Light touch is intact bilateral, Muscle strength normal.  MUSCULOSKELETAL: acceptable muscle strength, tone and stability bilateral.  Intrinsic muscluature intact bilateral.  Rectus appearance of foot and digits noted bilateral. Severe hammer toe/mallet toe second right foot. Hallux malleus hallux right foot.  DERMATOLOGIC: skin color, texture, and turgor are within normal limits except for callus right foot.  Patient has a pre-ulcerous callus noted under the second MPJ of the right foot. After debridement the ulcer has necrotic tissue distal laterally of necrotic tissue measuring 10 mm. X 10 mm.  No infection or drainage noted.  No evidence of any swelling, redness or drainage noted at this area. There is healing skin proximally relative necrotic tissue.   the ulcer on the right forefoot. No malodor noted  Diabetic ulcer right forefoot  ROV  Debride necrotic tissue.   .  I will send paperwork to medical doctor to inquire if he fits the guidelines for diabetic shoes.  RTC 3 weeks.  Gardiner Barefoot DPM

## 2016-06-22 ENCOUNTER — Ambulatory Visit: Payer: PPO | Admitting: Podiatry

## 2016-06-22 ENCOUNTER — Encounter: Payer: Self-pay | Admitting: Podiatry

## 2016-06-22 DIAGNOSIS — L84 Corns and callosities: Secondary | ICD-10-CM | POA: Diagnosis not present

## 2016-06-22 DIAGNOSIS — M2041 Other hammer toe(s) (acquired), right foot: Secondary | ICD-10-CM

## 2016-06-22 DIAGNOSIS — L97512 Non-pressure chronic ulcer of other part of right foot with fat layer exposed: Secondary | ICD-10-CM

## 2016-06-22 DIAGNOSIS — L97519 Non-pressure chronic ulcer of other part of right foot with unspecified severity: Secondary | ICD-10-CM

## 2016-06-22 DIAGNOSIS — E11621 Type 2 diabetes mellitus with foot ulcer: Secondary | ICD-10-CM

## 2016-06-22 DIAGNOSIS — M2042 Other hammer toe(s) (acquired), left foot: Secondary | ICD-10-CM

## 2016-06-22 NOTE — Progress Notes (Addendum)
This patient presents to the office for diagnosis of a diabetic ulcer right forefoot. he's not having any pain, discomfort or  drainage  for the last four weeks.   [name] He is very pleased with his progress at this time. He presents the office for continued evaluation of diabetic ulcer  GENERAL APPEARANCE: Alert, conversant. Appropriately groomed. No acute distress.  VASCULAR: Pedal pulses are  palpable at  [facility] and PT bilateral.  Capillary refill time is immediate to all digits,  Normal temperature gradient.  Digital hair growth is present bilateral  NEUROLOGIC: sensation is diminished to 5.07 monofilament at 5/5 sites bilateral.  Light touch is intact bilateral, Muscle strength normal.  MUSCULOSKELETAL: acceptable muscle strength, tone and stability bilateral.  Intrinsic muscluature intact bilateral.  Rectus appearance of foot and digits noted bilateral. Severe hammer toe/mallet toe second right foot. Hallux malleus hallux right foot.  DERMATOLOGIC: skin color, texture, and turgor are within normal limits except for callus right foot.  Patient has a pre-ulcerous callus noted under the second MPJ of the right foot. After debridement he  was noted that there was healing of the ulcer.  No evidence of any swelling, redness or drainage noted at this area. There is normal skin encircling the ulcer on the right forefoot. No malodor noted  Diabetic ulcer right forefoot  ROV  Debride necrotic tissue.     Measuring for diabetic shoes.   RTC 3 weeks.  Gardiner Barefoot DPM

## 2016-07-13 ENCOUNTER — Ambulatory Visit (INDEPENDENT_AMBULATORY_CARE_PROVIDER_SITE_OTHER): Payer: PPO | Admitting: Podiatry

## 2016-07-13 ENCOUNTER — Encounter: Payer: Self-pay | Admitting: Podiatry

## 2016-07-13 DIAGNOSIS — L84 Corns and callosities: Secondary | ICD-10-CM

## 2016-07-13 DIAGNOSIS — L97519 Non-pressure chronic ulcer of other part of right foot with unspecified severity: Secondary | ICD-10-CM

## 2016-07-13 DIAGNOSIS — E11621 Type 2 diabetes mellitus with foot ulcer: Secondary | ICD-10-CM

## 2016-07-13 NOTE — Progress Notes (Signed)
This patient presents to the office for diagnosis of a diabetic ulcer right forefoot. He presents to the office saying his callus has grown thick and the foot is painful walking.  He says he has been active.  He denies drainage from the pre-ulcerous callus.  He presents the office for continued evaluation of diabetic ulcer  GENERAL APPEARANCE: Alert, conversant. Appropriately groomed. No acute distress.  VASCULAR: Pedal pulses are  palpable at  [facility] and PT bilateral.  Capillary refill time is immediate to all digits,  Normal temperature gradient.  Digital hair growth is present bilateral  NEUROLOGIC: sensation is diminished to 5.07 monofilament at 5/5 sites bilateral.  Light touch is intact bilateral, Muscle strength normal.  MUSCULOSKELETAL: acceptable muscle strength, tone and stability bilateral.  Intrinsic muscluature intact bilateral.  Rectus appearance of foot and digits noted bilateral. Severe hammer toe/mallet toe second right foot. Hallux malleus hallux right foot.  DERMATOLOGIC: skin color, texture, and turgor are within normal limits except for callus right foot.  Patient has a pre-ulcerous callus noted under the second MPJ of the right foot.  Debridement of callus /necrotic tissue right forefoot. No infection or drainage noted.  No evidence of any swelling, redness or drainage noted at this area.   Diabetic ulcer right forefoot  ROV  Debride necrotic tissue.   Patient should have received his shoes prior to that visit.  RTC 3 weeks.  Gardiner Barefoot DPM

## 2016-07-20 ENCOUNTER — Encounter: Payer: Self-pay | Admitting: Podiatry

## 2016-07-20 ENCOUNTER — Ambulatory Visit (INDEPENDENT_AMBULATORY_CARE_PROVIDER_SITE_OTHER): Payer: PPO | Admitting: Podiatry

## 2016-07-20 DIAGNOSIS — M2041 Other hammer toe(s) (acquired), right foot: Secondary | ICD-10-CM

## 2016-07-20 DIAGNOSIS — M2042 Other hammer toe(s) (acquired), left foot: Secondary | ICD-10-CM

## 2016-07-20 DIAGNOSIS — L97519 Non-pressure chronic ulcer of other part of right foot with unspecified severity: Secondary | ICD-10-CM

## 2016-07-20 DIAGNOSIS — L84 Corns and callosities: Secondary | ICD-10-CM

## 2016-07-20 DIAGNOSIS — L97512 Non-pressure chronic ulcer of other part of right foot with fat layer exposed: Secondary | ICD-10-CM | POA: Diagnosis not present

## 2016-07-20 DIAGNOSIS — E11621 Type 2 diabetes mellitus with foot ulcer: Secondary | ICD-10-CM

## 2016-07-20 NOTE — Patient Instructions (Signed)

## 2016-07-20 NOTE — Progress Notes (Signed)
Patient ID: MATHIS CASHMAN, male   DOB: 04-03-1950, 66 y.o.   MRN: [id number]  Patient presents for diabetic shoe pick up, shoes are tried on for good fit.  Patient received 1 Pair and 3 pairs custom molded diabetic inserts.  Verbal and written break in and wear instructions given.  Patient will follow up for scheduled routine care.   This patient presents to pick up his diabetic shoes.    Ulcer right foot.  DPN   Hammer toes  B/L  Dispense diabetic shoes.   Patient presents today and was dispensed 0ne pair ( two units) of medically necessary extra depth shoes with three pair( six units) of custom molded multiple density inserts. The shoes and the inserts are fitted to the patients ' feet and are noted to fit well and are free of defect.  Length and width of the shoes are also acceptable.  Patient was given written and verbal  instructions for wearing.  If any concerns arrive with the shoes or inserts, the patient is to call the office.Patient is to follow up with doctor in six weeks.   Gardiner Barefoot DPM

## 2016-08-10 ENCOUNTER — Encounter: Payer: Self-pay | Admitting: Podiatry

## 2016-08-10 ENCOUNTER — Ambulatory Visit (INDEPENDENT_AMBULATORY_CARE_PROVIDER_SITE_OTHER): Payer: PPO | Admitting: Podiatry

## 2016-08-10 DIAGNOSIS — E11621 Type 2 diabetes mellitus with foot ulcer: Secondary | ICD-10-CM | POA: Diagnosis not present

## 2016-08-10 DIAGNOSIS — L97519 Non-pressure chronic ulcer of other part of right foot with unspecified severity: Secondary | ICD-10-CM

## 2016-08-10 DIAGNOSIS — M79676 Pain in unspecified toe(s): Secondary | ICD-10-CM | POA: Diagnosis not present

## 2016-08-10 DIAGNOSIS — L84 Corns and callosities: Secondary | ICD-10-CM

## 2016-08-10 DIAGNOSIS — B351 Tinea unguium: Secondary | ICD-10-CM

## 2016-08-10 NOTE — Progress Notes (Signed)
This patient presents the office for continued evaluation of a diabetic ulcer on his right forefoot.  He says the diabetic ulcer has completely healed and he is not having any pain or discomfort or drainage from the ulcer site.  He says he has thick disfigured discolored toenails on both feet, which are painful walking and wearing his shoes.  He presents the office today for an evaluation of the ulcer and treatment of his long thick nails.  This patient is diabetic and has been treated with diabetic shoes which has helped clear up his draining ulcer   GENERAL APPEARANCE: Alert, conversant. Appropriately groomed. No acute distress.  VASCULAR: Pedal pulses are  palpable at  [facility] and PT bilateral.  Capillary refill time is immediate to all digits,  Normal temperature gradient.  Digital hair growth is present bilateral  NEUROLOGIC: sensation is diminished  to 5.07 monofilament at 5/5 sites bilateral.  Light touch is intact bilateral, Muscle strength normal.  MUSCULOSKELETAL: acceptable muscle strength, tone and stability bilateral.  Hammer toe second right.  Hallux malleus right foot. NAILS  Thick disfigured discolored nails both feet. DERMATOLOGIC: skin color, texture, and turgor are within normal limits.  No preulcerative lesions or ulcers  are seen, no interdigital maceration noted.  No open lesions present.   No drainage noted.   Healed diabetic ulcer.  Onychomycosis  B/L  Debride nails  RTC 10 weeks for continued preventative foot care services.   Gardiner Barefoot DPM

## 2016-08-21 DIAGNOSIS — E119 Type 2 diabetes mellitus without complications: Secondary | ICD-10-CM | POA: Diagnosis not present

## 2016-08-21 DIAGNOSIS — E785 Hyperlipidemia, unspecified: Secondary | ICD-10-CM | POA: Diagnosis not present

## 2016-08-21 DIAGNOSIS — I10 Essential (primary) hypertension: Secondary | ICD-10-CM | POA: Diagnosis not present

## 2016-08-21 DIAGNOSIS — E039 Hypothyroidism, unspecified: Secondary | ICD-10-CM | POA: Diagnosis not present

## 2016-08-28 DIAGNOSIS — E119 Type 2 diabetes mellitus without complications: Secondary | ICD-10-CM | POA: Diagnosis not present

## 2016-08-28 DIAGNOSIS — M1000 Idiopathic gout, unspecified site: Secondary | ICD-10-CM | POA: Diagnosis not present

## 2016-08-28 DIAGNOSIS — E038 Other specified hypothyroidism: Secondary | ICD-10-CM | POA: Diagnosis not present

## 2016-08-28 DIAGNOSIS — E785 Hyperlipidemia, unspecified: Secondary | ICD-10-CM | POA: Diagnosis not present

## 2016-10-22 ENCOUNTER — Ambulatory Visit (INDEPENDENT_AMBULATORY_CARE_PROVIDER_SITE_OTHER): Payer: PPO | Admitting: Podiatry

## 2016-10-22 ENCOUNTER — Encounter: Payer: Self-pay | Admitting: Podiatry

## 2016-10-22 DIAGNOSIS — Q828 Other specified congenital malformations of skin: Secondary | ICD-10-CM | POA: Diagnosis not present

## 2016-10-22 DIAGNOSIS — B351 Tinea unguium: Secondary | ICD-10-CM

## 2016-10-22 DIAGNOSIS — M79676 Pain in unspecified toe(s): Secondary | ICD-10-CM

## 2016-10-22 DIAGNOSIS — M2041 Other hammer toe(s) (acquired), right foot: Secondary | ICD-10-CM

## 2016-10-22 NOTE — Progress Notes (Signed)
This patient presents the office for continued evaluation of a diabetic ulcer on his right forefoot.  He says the diabetic ulcer has completely healed and he is not having any pain or discomfort or drainage from the ulcer site.  He says he has thick disfigured discolored toenails on both feet, which are painful walking and wearing his shoes.  He presents the office today for an evaluation of the ulcer and treatment of his long thick nails.  This patient is diabetic and has been treated with diabetic shoes which has helped clear up his draining ulcer.  He also states that his girlfriend's treatment and care of his ulcer is part of the reason for the ulcer healing completely.   GENERAL APPEARANCE: Alert, conversant. Appropriately groomed. No acute distress.  VASCULAR: Pedal pulses are  palpable at  [facility] and PT bilateral.  Capillary refill time is immediate to all digits,  Normal temperature gradient.  Digital hair growth is present bilateral  NEUROLOGIC: sensation is diminished  to 5.07 monofilament at 5/5 sites bilateral.  Light touch is intact bilateral, Muscle strength normal.  MUSCULOSKELETAL: acceptable muscle strength, tone and stability bilateral.  Hammer toe second right.  Hallux malleus right foot. NAILS  Thick disfigured discolored nails both feet. DERMATOLOGIC: skin color, texture, and turgor are within normal limits.  No preulcerative lesions or ulcers  are seen, no interdigital maceration noted.  No open lesions present.   No drainage noted.Callus sub 2 right foot.   Healed diabetic ulcer.  Onychomycosis  B/L  Debride nails  RTC 10 weeks for continued preventative foot care services.   Gardiner Barefoot DPM

## 2016-11-11 DIAGNOSIS — I428 Other cardiomyopathies: Secondary | ICD-10-CM | POA: Diagnosis not present

## 2016-11-11 DIAGNOSIS — I519 Heart disease, unspecified: Secondary | ICD-10-CM | POA: Diagnosis not present

## 2016-11-11 DIAGNOSIS — I5022 Chronic systolic (congestive) heart failure: Secondary | ICD-10-CM | POA: Diagnosis not present

## 2016-11-11 DIAGNOSIS — I480 Paroxysmal atrial fibrillation: Secondary | ICD-10-CM | POA: Diagnosis not present

## 2016-12-04 DIAGNOSIS — E785 Hyperlipidemia, unspecified: Secondary | ICD-10-CM | POA: Diagnosis not present

## 2016-12-04 DIAGNOSIS — E119 Type 2 diabetes mellitus without complications: Secondary | ICD-10-CM | POA: Diagnosis not present

## 2016-12-04 DIAGNOSIS — I10 Essential (primary) hypertension: Secondary | ICD-10-CM | POA: Diagnosis not present

## 2016-12-18 DIAGNOSIS — E785 Hyperlipidemia, unspecified: Secondary | ICD-10-CM | POA: Diagnosis not present

## 2016-12-18 DIAGNOSIS — I4891 Unspecified atrial fibrillation: Secondary | ICD-10-CM | POA: Diagnosis not present

## 2016-12-18 DIAGNOSIS — E038 Other specified hypothyroidism: Secondary | ICD-10-CM | POA: Diagnosis not present

## 2016-12-18 DIAGNOSIS — E119 Type 2 diabetes mellitus without complications: Secondary | ICD-10-CM | POA: Diagnosis not present

## 2017-01-14 ENCOUNTER — Encounter: Payer: Self-pay | Admitting: Podiatry

## 2017-01-14 ENCOUNTER — Ambulatory Visit: Payer: PPO | Admitting: Podiatry

## 2017-01-14 DIAGNOSIS — B351 Tinea unguium: Secondary | ICD-10-CM

## 2017-01-14 DIAGNOSIS — M79676 Pain in unspecified toe(s): Secondary | ICD-10-CM | POA: Diagnosis not present

## 2017-01-14 DIAGNOSIS — Q828 Other specified congenital malformations of skin: Secondary | ICD-10-CM

## 2017-01-14 DIAGNOSIS — M2041 Other hammer toe(s) (acquired), right foot: Secondary | ICD-10-CM

## 2017-01-14 DIAGNOSIS — L97519 Non-pressure chronic ulcer of other part of right foot with unspecified severity: Secondary | ICD-10-CM

## 2017-01-14 DIAGNOSIS — E11621 Type 2 diabetes mellitus with foot ulcer: Secondary | ICD-10-CM

## 2017-01-14 NOTE — Progress Notes (Addendum)
This patient presents the office for continued evaluation of a diabetic ulcer on his right forefoot.  He says the diabetic ulcer has completely healed and he is not having any pain or discomfort or drainage from the ulcer site.  He says he has thick disfigured discolored toenails on both feet, which are painful walking and wearing his shoes.  He presents the office today for an evaluation of the ulcer and treatment of his long thick nails.  This patient is diabetic and has been treated with diabetic shoes which has helped clear up his draining ulcer.     GENERAL APPEARANCE: Alert, conversant. Appropriately groomed. No acute distress.  VASCULAR: Pedal pulses are  palpable at  [facility] and PT bilateral.  Capillary refill time is immediate to all digits,  Normal temperature gradient.  Digital hair growth is present bilateral  NEUROLOGIC: sensation is diminished  to 5.07 monofilament at 5/5 sites bilateral.  Light touch is intact bilateral, Muscle strength normal.  MUSCULOSKELETAL: acceptable muscle strength, tone and stability bilateral.  Hammer toe second right.  Hallux malleus right foot. NAILS  Thick disfigured discolored nails both feet. DERMATOLOGIC: skin color, texture, and turgor are within normal limits.  No preulcerative lesions or ulcers  are seen, no interdigital maceration noted.  No open lesions present.   No drainage noted.Callus sub 2 right foot.   Healed diabetic ulcer.  Onychomycosis  B/L  Debride nails  RTC 10 weeks for continued preventative foot care services..  Debride porokeratosis.Needs to see Liliane Channel for his insoles.   Gardiner Barefoot DPM

## 2017-01-20 ENCOUNTER — Ambulatory Visit: Payer: PPO | Admitting: Orthotics

## 2017-01-20 DIAGNOSIS — M2042 Other hammer toe(s) (acquired), left foot: Secondary | ICD-10-CM

## 2017-01-20 DIAGNOSIS — M2041 Other hammer toe(s) (acquired), right foot: Secondary | ICD-10-CM

## 2017-01-20 DIAGNOSIS — B351 Tinea unguium: Secondary | ICD-10-CM

## 2017-01-20 DIAGNOSIS — M79676 Pain in unspecified toe(s): Principal | ICD-10-CM

## 2017-01-20 NOTE — Progress Notes (Signed)
Adding horseshoe offload to diabetic insert.

## 2017-01-27 ENCOUNTER — Ambulatory Visit (INDEPENDENT_AMBULATORY_CARE_PROVIDER_SITE_OTHER): Payer: PPO | Admitting: Orthotics

## 2017-01-27 DIAGNOSIS — L84 Corns and callosities: Secondary | ICD-10-CM

## 2017-01-27 DIAGNOSIS — B351 Tinea unguium: Secondary | ICD-10-CM

## 2017-01-27 DIAGNOSIS — M2042 Other hammer toe(s) (acquired), left foot: Secondary | ICD-10-CM

## 2017-01-27 DIAGNOSIS — M2041 Other hammer toe(s) (acquired), right foot: Secondary | ICD-10-CM

## 2017-01-27 DIAGNOSIS — M79676 Pain in unspecified toe(s): Principal | ICD-10-CM

## 2017-01-27 NOTE — Progress Notes (Signed)
Patient picked up modified foot orthotic with buttress to offload painful callus.

## 2017-01-29 ENCOUNTER — Telehealth: Payer: Self-pay

## 2017-01-29 NOTE — Telephone Encounter (Signed)
Patient called stated that the inserts that he received irritated a previous healing ulcer on the bottom of his foot and he was not happy with the inserts.  He stated that his foot had been oozing for a couple of days and he has been soaking in Epson salt.  I educated him on the s/s of infection, to continue soaking in Epson salt once daily and use his Mupiricin ointment.  I informed him to go to ER if wound became worse or signs of infection became present.  He will call on Monday to schedule appt with [facility] for orthotic adjustment and follow up with Dr. Prudence Davidson if wound persists.   He verbalized understanding.

## 2017-02-01 ENCOUNTER — Encounter: Payer: Self-pay | Admitting: Podiatry

## 2017-02-01 ENCOUNTER — Ambulatory Visit: Payer: PPO | Admitting: Podiatry

## 2017-02-01 DIAGNOSIS — L97519 Non-pressure chronic ulcer of other part of right foot with unspecified severity: Secondary | ICD-10-CM

## 2017-02-01 DIAGNOSIS — E11621 Type 2 diabetes mellitus with foot ulcer: Secondary | ICD-10-CM | POA: Diagnosis not present

## 2017-02-01 DIAGNOSIS — L97512 Non-pressure chronic ulcer of other part of right foot with fat layer exposed: Secondary | ICD-10-CM

## 2017-02-01 NOTE — Progress Notes (Signed)
This patient presents the office for continued evaluation of a diabetic ulcer on his right forefoot.  He says the diabetic ulcer was straining last Friday and he called to the office and talked with Angie.  He was given an appointment for an evaluation of his diabetic ulcer right foot.  He says that ricks put on a cut outs on his insole and since that time he has been experiencing pain and discomfort in his right forefoot and saw drainage last Friday.  He presents the office today for an evaluation of his right foot.  He says he discontinued using the cut out insole made by [facility] until this appointment.     GENERAL APPEARANCE: Alert, conversant. Appropriately groomed. No acute distress.  VASCULAR: Pedal pulses are  palpable at  [facility] and PT bilateral.  Capillary refill time is immediate to all digits,  Normal temperature gradient.  Digital hair growth is present bilateral  NEUROLOGIC: sensation is diminished  to 5.07 monofilament at 5/5 sites bilateral.  Light touch is intact bilateral, Muscle strength normal.  MUSCULOSKELETAL: acceptable muscle strength, tone and stability bilateral.  Hammer toe second right.  Hallux malleus right foot. NAILS  Thick disfigured discolored nails both feet. DERMATOLOGIC: skin color, texture, and turgor are within normal limits.  No preulcerative lesions or ulcers  are seen, no interdigital maceration noted.  No open lesions present.   No drainage noted.Callus sub 2 right foot.   Healed diabetic ulcer.  Debride ulcer/hyperkeratotic tissue sub 2 right foot.   RTC 10  for continued preventative foot care services..  Debride porokeratosis  Cut out was performed on his insoles and upon leaving felt much better.   Gardiner Barefoot DPM

## 2017-02-22 ENCOUNTER — Encounter: Payer: Self-pay | Admitting: Podiatry

## 2017-02-22 ENCOUNTER — Ambulatory Visit (INDEPENDENT_AMBULATORY_CARE_PROVIDER_SITE_OTHER): Payer: PPO | Admitting: Podiatry

## 2017-02-22 DIAGNOSIS — L97519 Non-pressure chronic ulcer of other part of right foot with unspecified severity: Secondary | ICD-10-CM

## 2017-02-22 DIAGNOSIS — L97512 Non-pressure chronic ulcer of other part of right foot with fat layer exposed: Secondary | ICD-10-CM

## 2017-02-22 DIAGNOSIS — E11621 Type 2 diabetes mellitus with foot ulcer: Secondary | ICD-10-CM

## 2017-02-22 NOTE — Progress Notes (Signed)
   Subjective:    Patient ID: [name], male    DOB: 1950/08/12, 67 y.o.   MRN: [id number]  HPIthis patient presents the office for continued evaluation of a diabetic ulcer on his right forefoot.  He says that the ulcer started draining approximately 2 days ago and that his girlfriend said there was an odor coming from the ulcer on the right forefoot.  Patient says that his ulcer has been breaking down since modifications have been added to his insole.  He says he ambulates with the modified diabetic insoles in his shoes and the ulcer has broken down twice including this visit.  He says it has been painful and draining for the last few days and he presents the office today for continued evaluation and treatment of his diabetic ulcer.    Review of Systems  HENT: Positive for hearing loss and tinnitus.   Cardiovascular: Positive for leg swelling.  Musculoskeletal: Positive for back pain and myalgias.  Hematological:       Slow to heal       Objective:   Physical Exam General Appearance  Alert, conversant and in no acute stress.  Vascular  Dorsalis pedis and posterior pulses are palpable  bilaterally.  Capillary return is within normal limits  bilaterally. Temperature is within normal limits  Bilaterally.  Neurologic  Senn-Weinstein monofilament wire test diminished   bilaterally. Muscle power within normal limits bilaterally.  Nails Thick disfigured discolored nails with subungual debris bilaterally from hallux to fifth toes bilaterally. No evidence of bacterial infection or drainage bilaterally.  Orthopedic  No limitations of motion of motion feet bilaterally.  No crepitus or effusions noted.  No bony pathology or digital deformities noted.  Skin  normotropic skin with no porokeratosis noted bilaterally. Localized hyperkeratotic tissue noted sub-2 right foot with an ulcerated area at the center of this hyperkeratosis measuring 5 mm x 5 mm. , no fluctuance, drainage or redness  noted.   Diabetic ulcer right forefoot.   Debridement of ulcer, sub-2 right foot.  Patient is awaiting new diabetic insoles from [facility].   Patient was instructed to soak and bandage his foot as previously performed.  Patient was told to return to the office in 3 weeks for an evaluation and treatment of his diabetic ulcer.  I will also look into his diabetic insole when I see Rick tomorrow.  Gardiner Barefoot DPM        Assessment & Plan:

## 2017-03-09 ENCOUNTER — Other Ambulatory Visit: Payer: Self-pay | Admitting: Physician Assistant

## 2017-03-09 ENCOUNTER — Ambulatory Visit
Admission: RE | Admit: 2017-03-09 | Discharge: 2017-03-09 | Disposition: A | Discharge status: Home / Self Care | Payer: PPO | Source: Ambulatory Visit | Attending: Physician Assistant | Admitting: Physician Assistant

## 2017-03-09 ENCOUNTER — Other Ambulatory Visit
Admission: RE | Admit: 2017-03-09 | Discharge: 2017-03-09 | Disposition: A | Discharge status: Home / Self Care | Payer: PPO | Source: Ambulatory Visit | Attending: Physician Assistant | Admitting: Physician Assistant

## 2017-03-09 ENCOUNTER — Encounter: Payer: PPO | Attending: Physician Assistant | Admitting: Physician Assistant

## 2017-03-09 DIAGNOSIS — Z880 Allergy status to penicillin: Secondary | ICD-10-CM | POA: Insufficient documentation

## 2017-03-09 DIAGNOSIS — E11621 Type 2 diabetes mellitus with foot ulcer: Secondary | ICD-10-CM | POA: Insufficient documentation

## 2017-03-09 DIAGNOSIS — I482 Chronic atrial fibrillation: Secondary | ICD-10-CM | POA: Insufficient documentation

## 2017-03-09 DIAGNOSIS — I10 Essential (primary) hypertension: Secondary | ICD-10-CM | POA: Diagnosis not present

## 2017-03-09 DIAGNOSIS — Z7984 Long term (current) use of oral hypoglycemic drugs: Secondary | ICD-10-CM | POA: Insufficient documentation

## 2017-03-09 DIAGNOSIS — E1151 Type 2 diabetes mellitus with diabetic peripheral angiopathy without gangrene: Secondary | ICD-10-CM | POA: Diagnosis not present

## 2017-03-09 DIAGNOSIS — S91301A Unspecified open wound, right foot, initial encounter: Secondary | ICD-10-CM

## 2017-03-09 DIAGNOSIS — M19071 Primary osteoarthritis, right ankle and foot: Secondary | ICD-10-CM | POA: Diagnosis not present

## 2017-03-09 DIAGNOSIS — X58XXXA Exposure to other specified factors, initial encounter: Secondary | ICD-10-CM | POA: Insufficient documentation

## 2017-03-09 DIAGNOSIS — L97512 Non-pressure chronic ulcer of other part of right foot with fat layer exposed: Secondary | ICD-10-CM | POA: Diagnosis not present

## 2017-03-09 DIAGNOSIS — Z87891 Personal history of nicotine dependence: Secondary | ICD-10-CM | POA: Diagnosis not present

## 2017-03-09 DIAGNOSIS — Z79899 Other long term (current) drug therapy: Secondary | ICD-10-CM | POA: Insufficient documentation

## 2017-03-09 DIAGNOSIS — E119 Type 2 diabetes mellitus without complications: Secondary | ICD-10-CM | POA: Insufficient documentation

## 2017-03-09 LAB — HEMOGLOBIN A1C
HEMOGLOBIN A1C: 5.9 % — AB (ref 4.8–5.6)
Mean Plasma Glucose: 122.63 mg/dL

## 2017-03-10 NOTE — Progress Notes (Signed)
[name], [name] ([id number]) Visit Report for 03/09/2017 Abuse/Suicide Risk Screen Details Patient Name: [name], [name]. Date of Service: 03/09/2017 8:00 AM Medical Record Number: [id number] Patient Account Number: [id number] Date of Birth/Sex: April 16, 1950 (67 y.o. Male) Treating RN: [name] Primary Care Tomeca Helm: Benita Stabile Other Clinician: Referring Margia Wiesen: Referral, Self Treating Genieve Ramaswamy/Extender: STONE III, HOYT Weeks in Treatment: 0 Abuse/Suicide Risk Screen Items Answer ABUSE/SUICIDE RISK SCREEN: Has anyone close to you tried to hurt or harm you recentlyo No Do you feel uncomfortable with anyone in your familyo No Has anyone forced you do things that you didnot want to doo No Do you have any thoughts of harming yourselfo No Patient displays signs or symptoms of abuse and/or neglect. No Electronic Signature(s) Signed: 03/09/2017 4:30:06 PM By: [name] Entered By: [name] on 03/09/2017 08:14:20 Bruington, [name] ([id number]) -------------------------------------------------------------------------------- Activities of Daily Living Details Patient Name: [name]. Date of Service: 03/09/2017 8:00 AM Medical Record Number: [id number] Patient Account Number: [id number] Date of Birth/Sex: Nov 26, 1950 (67 y.o. Male) Treating RN: [name] Primary Care Maylee Bare: Benita Stabile Other Clinician: Referring Leila Schuff: Referral, Self Treating Eschol Auxier/Extender: STONE III, HOYT Weeks in Treatment: 0 Activities of Daily Living Items Answer Activities of Daily Living (Please select one for each item) Drive Automobile Completely Able Take Medications Completely Able Use Telephone Completely Able Care for Appearance Completely Able Use Toilet Completely Able Bath / Shower Completely Able Dress Self Completely Able Feed Self Completely Able Walk Completely Able Get In / Out Bed Completely Able Housework Completely Able Prepare Meals Completely [facility] for Self Completely Able Electronic Signature(s) Signed: 03/09/2017 4:30:06 PM By: [name] Entered By: [name] on 03/09/2017 08:14:39 Rachels, [name] ([id number]) -------------------------------------------------------------------------------- Education Assessment Details Patient Name: [name]. Date of Service: 03/09/2017 8:00 AM Medical Record Number: [id number] Patient Account Number: [id number] Date of Birth/Sex: October 16, 1950 (67 y.o. Male) Treating RN: [name] Primary Care Sandria Mcenroe: Benita Stabile Other Clinician: Referring Leiby Pigeon: Referral, Self Treating Jensyn Shave/Extender: Melburn Hake, HOYT Weeks in Treatment: 0 Primary Learner Assessed: Patient Learning Preferences/Education Level/Primary Language Learning Preference: Explanation, Demonstration Highest Education Level: High School Preferred Language: English Cognitive Barrier Assessment/Beliefs Language Barrier: No Translator Needed: No Memory Deficit: No Emotional Barrier: No Cultural/Religious Beliefs Affecting Medical Care: No Physical Barrier Assessment Impaired Vision: No Impaired Hearing: No Decreased Hand dexterity: No Knowledge/Comprehension Assessment Knowledge Level: Medium Comprehension Level: Medium Ability to understand written Medium instructions: Ability to understand verbal Medium instructions: Motivation Assessment Anxiety Level: Calm Cooperation: Cooperative Education Importance: Acknowledges Need Interest in Health Problems: Asks Questions Perception: Coherent Willingness to Engage in Self- Medium Management Activities: Readiness to Engage in Self- Medium Management Activities: Electronic Signature(s) Signed: 03/09/2017 4:30:06 PM By: [name] Entered By: [name] on 03/09/2017 08:15:00 Pieczynski, [name] ([id number]) -------------------------------------------------------------------------------- Fall Risk Assessment  Details Patient Name: [name]. Date of Service: 03/09/2017 8:00 AM Medical Record Number: [id number] Patient Account Number: [id number] Date of Birth/Sex: [birth year] (67 y.o. Male) Treating RN: [name] Primary Care Trisha Ken: Benita Stabile Other Clinician: Referring Dent Plantz: Referral, Self Treating Jahnessa Vanduyn/Extender: STONE III, HOYT Weeks in Treatment: 0 Fall Risk Assessment Items Have you had 2 or more falls in the last 12 monthso 0 No Have you had any fall that resulted in injury in the last 12 monthso 0 No FALL RISK ASSESSMENT: History of falling - immediate or within 3 months 0 No Secondary diagnosis 0 No Ambulatory aid None/bed rest/wheelchair/nurse 0 Yes Crutches/cane/walker 0 No Furniture 0  No IV Access/Saline Lock 0 No Gait/Training Normal/bed rest/immobile 0 No Weak 10 Yes Impaired 0 No Mental Status Oriented to own ability 0 Yes Electronic Signature(s) Signed: 03/09/2017 4:30:06 PM By: [name] Entered By: [name] on 03/09/2017 08:15:11 Neisen, [name] ([id number]) -------------------------------------------------------------------------------- Nutrition Risk Assessment Details Patient Name: [name]. Date of Service: 03/09/2017 8:00 AM Medical Record Number: [id number] Patient Account Number: [id number] Date of Birth/Sex: December 06, 1950 (67 y.o. Male) Treating RN: [name] Primary Care Maurita Havener: Benita Stabile Other Clinician: Referring Tyrae Alcoser: Referral, Self Treating Julitza Rickles/Extender: STONE III, HOYT Weeks in Treatment: 0 Height (in): 73 Weight (lbs): 231 Body Mass Index (BMI): 30.5 Nutrition Risk Assessment Items NUTRITION RISK SCREEN: I have an illness or condition that made me change the kind and/or amount of 0 No food I eat I eat fewer than two meals per day 0 No I eat few fruits and vegetables, or milk products 0 No I have three or more drinks of beer, liquor or wine almost every day 0 No I have tooth or mouth problems  that make it hard for me to eat 0 No I don't always have enough money to buy the food I need 0 No I eat alone most of the time 0 No I take three or more different prescribed or over-the-counter drugs a day 1 Yes Without wanting to, I have lost or gained 10 pounds in the last six months 0 No I am not always physically able to shop, cook and/or feed myself 0 No Nutrition Protocols Good Risk Protocol 0 No interventions needed Moderate Risk Protocol Electronic Signature(s) Signed: 03/09/2017 4:30:06 PM By: [name] Entered By: [name] on 03/09/2017 08:15:21

## 2017-03-10 NOTE — Progress Notes (Signed)
ANQUAN, AZZARELLO ([id number]) Visit Report for 03/09/2017 Allergy List Details Patient Name: JEYDEN, COFFELT. Date of Service: 03/09/2017 8:00 AM Medical Record Number: [id number] Patient Account Number: [id number] Date of Birth/Sex: 1950/07/04 (67 y.o. Male) Treating RN: Montey Hora Primary Care Taelyn Nemes: Benita Stabile Other Clinician: Referring Kade Rickels: Referral, Self Treating Vila Dory/Extender: STONE III, HOYT Weeks in Treatment: 0 Allergies Active Allergies penicillin Allergy Notes Electronic Signature(s) Signed: 03/09/2017 4:30:06 PM By: Montey Hora Entered By: Montey Hora on 03/09/2017 08:14:11 Cornette, [name] ([id number]) -------------------------------------------------------------------------------- Arrival Information Details Patient Name: [name] Date of Service: 03/09/2017 8:00 AM Medical Record Number: [id number] Patient Account Number: [id number] Date of Birth/Sex: 04-07-1950 (67 y.o. Male) Treating RN: Montey Hora Primary Care Burgandy Hackworth: Benita Stabile Other Clinician: Referring Jezel Basto: Referral, Self Treating Tywana Robotham/Extender: Melburn Hake, HOYT Weeks in Treatment: 0 Visit Information Patient Arrived: Ambulatory Arrival Time: 08:07 Accompanied By: girlfriend Transfer Assistance: None Patient Identification Verified: Yes Secondary Verification Process Completed: Yes Patient Has Alerts: Yes Patient Alerts: DMII Electronic Signature(s) Signed: 03/09/2017 4:30:06 PM By: Montey Hora Entered By: Montey Hora on 03/09/2017 08:09:40 Remigio, [name] ([id number]) -------------------------------------------------------------------------------- Clinic Level of Care Assessment Details Patient Name: ZIARE, CRYDER. Date of Service: 03/09/2017 8:00 AM Medical Record Number: [id number] Patient Account Number: [id number] Date of Birth/Sex: 28-Apr-1950 (67 y.o. Male) Treating RN: Ahmed Prima Primary Care Zayvian Mcmurtry: Benita Stabile Other Clinician: Referring  Chattie Greeson: Referral, Self Treating Ethel Meisenheimer/Extender: STONE III, HOYT Weeks in Treatment: 0 Clinic Level of Care Assessment Items TOOL 1 Quantity Score X - Use when EandM and Procedure is performed on INITIAL visit 1 0 ASSESSMENTS - Nursing Assessment / Reassessment X - General Physical Exam (combine w/ comprehensive assessment (listed just below) when 1 20 performed on new pt. evals) X- 1 25 Comprehensive Assessment (HX, ROS, Risk Assessments, Wounds Hx, etc.) ASSESSMENTS - Wound and Skin Assessment / Reassessment []  - Dermatologic / Skin Assessment (not related to wound area) 0 ASSESSMENTS - Ostomy and/or Continence Assessment and Care []  - Incontinence Assessment and Management 0 []  - 0 Ostomy Care Assessment and Management (repouching, etc.) PROCESS - Coordination of Care X - Simple Patient / Family Education for ongoing care 1 15 []  - 0 Complex (extensive) Patient / Family Education for ongoing care []  - 0 Staff obtains Programmer, systems, Records, Test Results / Process Orders []  - 0 Staff telephones HHA, Nursing Homes / Clarify orders / etc []  - 0 Routine Transfer to another Facility (non-emergent condition) []  - 0 Routine Hospital Admission (non-emergent condition) X- 1 15 New Admissions / Biomedical engineer / Ordering NPWT, Apligraf, etc. []  - 0 Emergency Hospital Admission (emergent condition) PROCESS - Special Needs []  - Pediatric / Minor Patient Management 0 []  - 0 Isolation Patient Management []  - 0 Hearing / Language / Visual special needs []  - 0 Assessment of Community assistance (transportation, D/C planning, etc.) []  - 0 Additional assistance / Altered mentation []  - 0 Support Surface(s) Assessment (bed, cushion, seat, etc.) Badilla, Rosser J. ([id number]) INTERVENTIONS - Miscellaneous []  - External ear exam 0 []  - 0 Patient Transfer (multiple staff / Civil Service fast streamer / Similar devices) []  - 0 Simple Staple / Suture removal (25 or less) []  - 0 Complex Staple /  Suture removal (26 or more) []  - 0 Hypo/Hyperglycemic Management (do not check if billed separately) X- 1 15 Ankle / Brachial Index (ABI) - do not check if billed separately Has the patient been seen at the hospital within the last three years: Yes Total Score: 90 Level Of  Care: New/Established - Level 3 Electronic Signature(s) Signed: 03/09/2017 4:06:48 PM By: Alric Quan Entered By: Alric Quan on 03/09/2017 12:43:30 Culbreth, [name] ([id number]) -------------------------------------------------------------------------------- Encounter Discharge Information Details Patient Name: BURAK, ZERBE. Date of Service: 03/09/2017 8:00 AM Medical Record Number: [id number] Patient Account Number: [id number] Date of Birth/Sex: 1950/11/08 (67 y.o. Male) Treating RN: Ahmed Prima Primary Care Yzabelle Calles: Benita Stabile Other Clinician: Referring Briley Bumgarner: Referral, Self Treating Taison Celani/Extender: STONE III, HOYT Weeks in Treatment: 0 Encounter Discharge Information Items Discharge Pain Level: 0 Discharge Condition: Stable Ambulatory Status: Ambulatory Discharge Destination: Home Transportation: Private Auto Accompanied By: wife Schedule Follow-up Appointment: No Medication Reconciliation completed and No provided to Patient/Care Brownie Nehme: Provided on Clinical Summary of Care: 03/09/2017 Form Type Recipient Paper Patient WT Electronic Signature(s) Signed: 03/09/2017 2:14:45 PM By: Roger Shelter Entered By: Roger Shelter on 03/09/2017 09:28:14 Luffman, [name] ([id number]) -------------------------------------------------------------------------------- Lower Extremity Assessment Details Patient Name: [name] Date of Service: 03/09/2017 8:00 AM Medical Record Number: [id number] Patient Account Number: [id number] Date of Birth/Sex: 08-Mar-1950 (67 y.o. Male) Treating RN: Montey Hora Primary Care Jazell Rosenau: Benita Stabile Other Clinician: Referring Symphanie Cederberg: Referral,  Self Treating Alister Staver/Extender: STONE III, HOYT Weeks in Treatment: 0 Edema Assessment Assessed: [Left: No] [Right: No] Edema: [Left: No] [Right: No] Vascular Assessment Pulses: Dorsalis Pedis Palpable: [Left:Yes] [Right:Yes] Doppler Audible: [Left:Yes] [Right:Yes] Posterior Tibial Palpable: [Left:Yes] [Right:Yes] Doppler Audible: [Left:Yes] [Right:Yes] Extremity colors, hair growth, and conditions: Extremity Color: [Left:Normal] [Right:Normal] Hair Growth on Extremity: [Left:Yes] [Right:No] Temperature of Extremity: [Left:Cool] [Right:Cool] Capillary Refill: [Left:< 3 seconds] [Right:< 3 seconds] Blood Pressure: Brachial: [Left:150] [Right:158] Dorsalis Pedis: 178 [Left:Dorsalis Pedis: 178] Ankle: Posterior Tibial: 180 [Left:Posterior Tibial: 190 1.14] [Right:1.20] Toe Nail Assessment Left: Right: Thick: Yes Yes Discolored: Yes Yes Deformed: Yes Yes Improper Length and Hygiene: No No Electronic Signature(s) Signed: 03/09/2017 4:30:06 PM By: Montey Hora Entered By: Montey Hora on 03/09/2017 08:31:03 Alves, [name] ([id number]) -------------------------------------------------------------------------------- Multi Wound Chart Details Patient Name: [name] Date of Service: 03/09/2017 8:00 AM Medical Record Number: [id number] Patient Account Number: [id number] Date of Birth/Sex: Nov 18, 1950 (67 y.o. Male) Treating RN: Ahmed Prima Primary Care Anissa Abbs: TATE, Sharlet Salina Other Clinician: Referring Eliyanna Ault: Referral, Self Treating Collins Kerby/Extender: STONE III, HOYT Weeks in Treatment: 0 Vital Signs Height(in): 73 Pulse(bpm): 61 Weight(lbs): 231 Blood Pressure(mmHg): 144/78 Body Mass Index(BMI): 30 Temperature(F): 97.6 Respiratory Rate 18 (breaths/min): Photos: [1:No Photos] [N/A:N/A] Wound Location: [1:Right Foot - Plantar] [N/A:N/A] Wounding Event: [1:Gradually Appeared] [N/A:N/A] Primary Etiology: [1:Diabetic Wound/Ulcer of the Lower Extremity]  [N/A:N/A] Comorbid History: [1:Arrhythmia, Hypertension, Type II Diabetes, Osteoarthritis] [N/A:N/A] Date Acquired: [1:02/15/2017] [N/A:N/A] Weeks of Treatment: [1:0] [N/A:N/A] Wound Status: [1:Open] [N/A:N/A] Pending Amputation on [1:Yes] [N/A:N/A] Presentation: Measurements L x W x D [1:0.5x0.4x0.8] [N/A:N/A] (cm) Area (cm) : [1:0.157] [N/A:N/A] Volume (cm) : [1:0.126] [N/A:N/A] Starting Position 1 [1:10] (o'clock): Ending Position 1 [1:5] (o'clock): Maximum Distance 1 (cm): [1:0.6] Undermining: [1:Yes] [N/A:N/A] Classification: [1:Grade 2] [N/A:N/A] Exudate Amount: [1:Large] [N/A:N/A] Exudate Type: [1:Serous] [N/A:N/A] Exudate Color: [1:amber] [N/A:N/A] Foul Odor After Cleansing: [1:Yes] [N/A:N/A] Odor Anticipated Due to [1:No] [N/A:N/A] Product Use: Wound Margin: [1:Flat and Intact] [N/A:N/A] Granulation Amount: [1:None Present (0%)] [N/A:N/A] Necrotic Amount: [1:Large (67-100%)] [N/A:N/A] Necrotic Tissue: [1:Eschar, Adherent Slough] [N/A:N/A] Exposed Structures: [1:Fat Layer (Subcutaneous Tissue) Exposed: Yes Fascia: No] [N/A:N/A] Tendon: No Muscle: No Joint: No Bone: No Epithelialization: None N/A N/A Periwound Skin Texture: Callus: Yes N/A N/A Excoriation: No Induration: No Crepitus: No Rash: No Scarring: No Periwound Skin Moisture: Maceration: Yes N/A N/A Dry/Scaly: No Periwound Skin Color: Atrophie  Blanche: No N/A N/A Cyanosis: No Ecchymosis: No Erythema: No Hemosiderin Staining: No Mottled: No Pallor: No Rubor: No Temperature: No Abnormality N/A N/A Tenderness on Palpation: Yes N/A N/A Wound Preparation: Ulcer Cleansing: N/A N/A Rinsed/Irrigated with Saline Topical Anesthetic Applied: Other: lidocaine 4% Treatment Notes Electronic Signature(s) Signed: 03/09/2017 4:06:48 PM By: Alric Quan Entered By: Alric Quan on 03/09/2017 08:36:29 [place]um, [name]  ([id number]) -------------------------------------------------------------------------------- [place] Details Patient Name: AYANSH, FEUTZ. Date of Service: 03/09/2017 8:00 AM Medical Record Number: [id number] Patient Account Number: [id number] Date of Birth/Sex: 1950/06/21 (67 y.o. Male) Treating RN: Ahmed Prima Primary Care Bodhi Stenglein: TATE, Sharlet Salina Other Clinician: Referring Christerpher Clos: Referral, Self Treating Macgregor Aeschliman/Extender: STONE III, HOYT Weeks in Treatment: 0 Active Inactive ` Nutrition Nursing Diagnoses: Imbalanced nutrition Impaired glucose control: actual or potential Potential for alteratiion in Nutrition/Potential for imbalanced nutrition Goals: Patient/caregiver agrees to and verbalizes understanding of need to use nutritional supplements and/or vitamins as prescribed Date Initiated: 03/09/2017 Target Resolution Date: 05/29/2017 Goal Status: Active Patient/caregiver will maintain therapeutic glucose control Date Initiated: 03/09/2017 Target Resolution Date: 05/29/2017 Goal Status: Active Interventions: Assess patient nutrition upon admission and as needed per policy Provide education on elevated blood sugars and impact on wound healing Provide education on nutrition Notes: ` Orientation to the Wound Care Program Nursing Diagnoses: Knowledge deficit related to the wound healing center program Goals: Patient/caregiver will verbalize understanding of the [place] Program Date Initiated: 03/09/2017 Target Resolution Date: 03/27/2017 Goal Status: Active Interventions: Provide education on orientation to the wound center Notes: ` Pain, Acute or Chronic Nursing Diagnoses: STARR, ENGEL ([id number]) Pain, acute or chronic: actual or potential Potential alteration in comfort, pain Goals: Patient/caregiver will verbalize adequate pain control between visits Date Initiated: 03/09/2017 Target Resolution Date: 05/29/2017 Goal Status:  Active Interventions: Complete pain assessment as per visit requirements Notes: ` Pressure Nursing Diagnoses: Knowledge deficit related to causes and risk factors for pressure ulcer development Knowledge deficit related to management of pressures ulcers Potential for impaired tissue integrity related to pressure, friction, moisture, and shear Goals: Patient will remain free from development of additional pressure ulcers Date Initiated: 03/09/2017 Target Resolution Date: 06/26/2017 Goal Status: Active Interventions: Assess offloading mechanisms upon admission and as needed Notes: ` Wound/Skin Impairment Nursing Diagnoses: Impaired tissue integrity Knowledge deficit related to ulceration/compromised skin integrity Goals: Ulcer/skin breakdown will have a volume reduction of 80% by week 12 Date Initiated: 03/09/2017 Target Resolution Date: 06/19/2017 Goal Status: Active Interventions: Assess ulceration(s) every visit Notes: Electronic Signature(s) Signed: 03/09/2017 4:06:48 PM By: Alric Quan Entered By: Alric Quan on 03/09/2017 08:36:20 Bellissimo, [name] ([id number]) -------------------------------------------------------------------------------- Pain Assessment Details Patient Name: [name] Date of Service: 03/09/2017 8:00 AM Medical Record Number: [id number] Patient Account Number: [id number] Date of Birth/Sex: Jun 22, 1950 (67 y.o. Male) Treating RN: Montey Hora Primary Care Georgena Weisheit: Benita Stabile Other Clinician: Referring Cleatis Fandrich: Referral, Self Treating Cristalle Rohm/Extender: STONE III, HOYT Weeks in Treatment: 0 Active Problems Location of Pain Severity and Description of Pain Patient Has Paino Yes Site Locations Pain Location: Pain in Ulcers With Dressing Change: Yes Duration of the Pain. Constant / Intermittento Intermittent Pain Management and Medication Current Pain Management: Notes Topical or injectable lidocaine is offered to patient for acute  pain when surgical debridement is performed. If needed, Patient is instructed to use over the counter pain medication for the following 24-48 hours after debridement. Wound care MDs do not prescribed pain medications. Patient has chronic pain or uncontrolled pain. Patient has been instructed to make an  appointment with their Primary Care Physician for pain management. Electronic Signature(s) Signed: 03/09/2017 4:30:06 PM By: Montey Hora Entered By: Montey Hora on 03/09/2017 08:09:59 Rodin, [name] ([id number]) -------------------------------------------------------------------------------- Patient/Caregiver Education Details Patient Name: [name] Date of Service: 03/09/2017 8:00 AM Medical Record Number: [id number] Patient Account Number: [id number] Date of Birth/Gender: 14-Jul-1950 (67 y.o. Male) Treating RN: Roger Shelter Primary Care Physician: Benita Stabile Other Clinician: Referring Physician: Referral, Self Treating Physician/Extender: Melburn Hake, HOYT Weeks in Treatment: 0 Education Assessment Education Provided To: Patient and Caregiver Education Topics Provided Welcome To [facility] [place]: Handouts: Welcome To [facility] Methods: Explain/Verbal Responses: State content correctly Wound Debridement: Handouts: Wound Debridement Methods: Explain/Verbal Responses: State content correctly Wound/Skin Impairment: Handouts: Caring for Your Ulcer Methods: Explain/Verbal Responses: State content correctly Electronic Signature(s) Signed: 03/09/2017 2:14:45 PM By: Roger Shelter Entered By: Roger Shelter on 03/09/2017 09:29:07 Carriker, [name] ([id number]) -------------------------------------------------------------------------------- Wound Assessment Details Patient Name: [name] Date of Service: 03/09/2017 8:00 AM Medical Record Number: [id number] Patient Account Number: [id number] Date of Birth/Sex: 12/31/1950 (67 y.o. Male) Treating  RN: Montey Hora Primary Care Michele Kerlin: Benita Stabile Other Clinician: Referring Seynabou Fults: Referral, Self Treating Maurya Nethery/Extender: STONE III, HOYT Weeks in Treatment: 0 Wound Status Wound Number: 1 Primary Diabetic Wound/Ulcer of the Lower Extremity Etiology: Wound Location: Right Foot - Plantar Wound Status: Open Wounding Event: Gradually Appeared Comorbid Arrhythmia, Hypertension, Type II Diabetes, Date Acquired: 02/15/2017 History: Osteoarthritis Weeks Of Treatment: 0 Clustered Wound: No Pending Amputation On Presentation Photos Wound Measurements Length: (cm) 0.5 % Reduction Width: (cm) 0.4 % Reduction Depth: (cm) 0.8 Epithelializ Area: (cm) 0.157 Tunneling: Volume: (cm) 0.126 Undermining Starting Ending Po Maximum D in Area: 0% in Volume: 0% ation: None No : Yes Position (o'clock): 10 sition (o'clock): 5 istance: (cm) 0.6 Wound Description Classification: Grade 2 Foul Odor Af Wound Margin: Flat and Intact Due to Produ Exudate Amount: Large Slough/Fibri Exudate Type: Serous Exudate Color: amber ter Cleansing: Yes ct Use: No no Yes Wound Bed Granulation Amount: None Present (0%) Exposed Structure Necrotic Amount: Large (67-100%) Fascia Exposed: No Necrotic Quality: Eschar, Adherent Slough Fat Layer (Subcutaneous Tissue) Exposed: Yes Tendon Exposed: No Muscle Exposed: No Pointer, Pacen J. ([id number]) Joint Exposed: No Bone Exposed: No Periwound Skin Texture Texture Color No Abnormalities Noted: No No Abnormalities Noted: No Callus: Yes Atrophie Blanche: No Crepitus: No Cyanosis: No Excoriation: No Ecchymosis: No Induration: No Erythema: No Rash: No Hemosiderin Staining: No Scarring: No Mottled: No Pallor: No Moisture Rubor: No No Abnormalities Noted: No Dry / Scaly: No Temperature / Pain Maceration: Yes Temperature: No Abnormality Tenderness on Palpation: Yes Wound Preparation Ulcer Cleansing: Rinsed/Irrigated with  Saline Topical Anesthetic Applied: Other: lidocaine 4%, Treatment Notes Wound #1 (Right, Plantar Foot) 1. Cleansed with: Clean wound with Normal Saline 2. Anesthetic Topical Lidocaine 4% cream to wound bed prior to debridement 4. Dressing Applied: Other dressing (specify in notes) 5. Secondary Dressing Applied Dry Gauze Kerlix/Conform 7. Secured with Tape Other (specify in notes) Notes silvercell, peg assist shoe for offloading Electronic Signature(s) Signed: 03/09/2017 9:15:52 AM By: Montey Hora Entered By: Montey Hora on 03/09/2017 09:15:52 Piccirilli, [name] ([id number]) -------------------------------------------------------------------------------- [place] Details Patient Name: [name] Date of Service: 03/09/2017 8:00 AM Medical Record Number: [id number] Patient Account Number: [id number] Date of Birth/Sex: November 19, 1950 (67 y.o. Male) Treating RN: Montey Hora Primary Care Kryssa Risenhoover: Benita Stabile Other Clinician: Referring Makyra Corprew: Referral, Self Treating Myana Schlup/Extender: STONE III, HOYT Weeks in Treatment: 0 Vital Signs Time Taken: 08:10  Temperature (F): 97.6 Height (in): 73 Pulse (bpm): 89 Source: Measured Respiratory Rate (breaths/min): 18 Weight (lbs): 231 Blood Pressure (mmHg): 144/78 Source: Measured Reference Range: 80 - 120 mg / dl Body Mass Index (BMI): 30.5 Electronic Signature(s) Signed: 03/09/2017 4:30:06 PM By: Montey Hora Entered By: Montey Hora on 03/09/2017 08:13:59

## 2017-03-10 NOTE — Progress Notes (Signed)
KINNEY, SACKMANN ([id number]) Visit Report for 03/09/2017 Chief Complaint Document Details Patient Name: [name], [name]. Date of Service: 03/09/2017 8:00 AM Medical Record Number: [id number] Patient Account Number: [id number] Date of Birth/Sex: [birth year] (67 y.o. Male) Treating RN: Ahmed Prima Primary Care Provider: Benita Stabile Other Clinician: Referring Provider: Referral, Self Treating Provider/Extender: [name], [name] Weeks in Treatment: 0 Information Obtained from: Patient Chief Complaint Right plantar foot ulcer Electronic Signature(s) Signed: 03/09/2017 5:27:42 PM By: Worthy Keeler [name] Entered By: Worthy Keeler on 03/09/2017 17:20:40 Korinek, [name] ([id number]) -------------------------------------------------------------------------------- Debridement Details Patient Name: [name]. Date of Service: 03/09/2017 8:00 AM Medical Record Number: [id number] Patient Account Number: [id number] Date of Birth/Sex: 1950-08-21 (67 y.o. Male) Treating RN: Ahmed Prima Primary Care Provider: Benita Stabile Other Clinician: Referring Provider: Referral, Self Treating Provider/Extender: [name], [name] Weeks in Treatment: 0 Debridement Performed for Wound #1 Right,Plantar Foot Assessment: Performed By: Physician [name], [name] E., [name] Debridement: Debridement Severity of Tissue Pre Fat layer exposed Debridement: Pre-procedure Verification/Time Yes - 08:47 Out Taken: Start Time: 08:48 Pain Control: Lidocaine 4% Topical Solution Level: Skin/Subcutaneous Tissue Total Area Debrided (L x W): 0.5 (cm) x 0.4 (cm) = 0.2 (cm) Tissue and other material Viable, Non-Viable, Exudate, Fibrin/Slough, Subcutaneous debrided: Instrument: Curette, Forceps, Scissors Bleeding: Minimum Hemostasis Achieved: Pressure End Time: 09:04 Procedural Pain: 0 Post Procedural Pain: 0 Response to Treatment: Procedure was tolerated well Post Debridement Measurements of Total Wound Length:  (cm) 1.7 Width: (cm) 1.4 Depth: (cm) 0.2 Volume: (cm) 0.374 Character of Wound/Ulcer Post Debridement: Requires Further Debridement Severity of Tissue Post Debridement: Fat layer exposed Post Procedure Diagnosis Same as Pre-procedure Electronic Signature(s) Signed: 03/09/2017 4:06:48 PM By: Alric Quan Signed: 03/09/2017 5:27:42 PM By: Worthy Keeler [name] Entered By: Alric Quan on 03/09/2017 09:06:40 [name], [name] ([id number]) -------------------------------------------------------------------------------- HPI Details Patient Name: [name]. Date of Service: 03/09/2017 8:00 AM Medical Record Number: [id number] Patient Account Number: [id number] Date of Birth/Sex: March 15, 1950 (67 y.o. Male) Treating RN: Ahmed Prima Primary Care Provider: Benita Stabile Other Clinician: Referring Provider: Referral, Self Treating Provider/Extender: [name], [name] Weeks in Treatment: 0 History of Present Illness Associated Signs and Symptoms: Patient has a history of diabetes mellitus type II, hypertension, and atrial fibrillation. He also has a hammertoe deformity of the bilateral feet which seems to be causing pressure in the ball of his foot. HPI Description: 03/09/17 on evaluation today patient presents for his initial evaluation concerning an ulcer on the plantar aspect of his right foot which has been open he tells me for about three weeks. He has been seen at [facility] where they did perform debridement it appears according to a note on 02/22/17 where unfortunately it appears that his callous area began to break down into an ulcer after modifications have been added to his insulin. This had happened previously as well. However I do not have details of the severity of debridement at that point although it does not sound as if you the debridement was too significant based on what the patient is telling me that he still had a lot of callous following debridement. With  that being said they were going to look into altering his insoles to try to prevent further breakdown in the future. Nonetheless he has had foul odor discharge coming from the ulcer which is noted all the way back to that visit on 02/[facility]. Patient states that this was concerning him more than anything else. He  does have diabetes although he described this as "borderline diabetes" he is on metoprolol however along with lisinopril. Patient is not having any issues with pain in regard to his right plantar foot. Electronic Signature(s) Signed: 03/09/2017 5:27:42 PM By: Worthy Keeler [name] Entered By: Worthy Keeler on 03/09/2017 17:21:36 [name], [name] ([id number]) -------------------------------------------------------------------------------- Physical Exam Details Patient Name: [name], [name]. Date of Service: 03/09/2017 8:00 AM Medical Record Number: [id number] Patient Account Number: [id number] Date of Birth/Sex: February 02, 1950 (67 y.o. Male) Treating RN: Ahmed Prima Primary Care Provider: Benita Stabile Other Clinician: Referring Provider: Referral, Self Treating Provider/Extender: [name], [name] Weeks in Treatment: 0 Constitutional patient is hypertensive.. pulse regular and within target range for patient.[name] respirations regular, non-labored and within target range for patient.[name] temperature within target range for patient.. Well-nourished and well-hydrated in no acute distress. Eyes conjunctiva clear no eyelid edema noted. pupils equal round and reactive to light and accommodation. Ears, Nose, Mouth, and Throat no gross abnormality of ear auricles or external auditory canals. normal hearing noted during conversation. mucus membranes moist. Respiratory normal breathing without difficulty. clear to auscultation bilaterally. Cardiovascular regular rate and rhythm with normal S1, S2. 1+ dorsalis pedis/posterior tibialis pulses. no clubbing, cyanosis,  significant edema, <3 sec cap refill. Gastrointestinal (GI) soft, non-tender, non-distended, +BS. no ventral hernia noted. Musculoskeletal normal gait and posture. no significant deformity or arthritic changes, no loss or range of motion, no clubbing. Psychiatric this patient is able to make decisions and demonstrates good insight into disease process. Alert and Oriented x 3. pleasant and cooperative. Notes On evaluation today patient's blood pressure was elevated and this is something that we did recommend him follow up with his primary care provider concerning. He tells me he does see him on a regular basis and that his provider is "on top of things". With that being said I did not hear any evidence of atrial fibrillation during evaluation today. Patient did have pulses noted in the bilateral feet. There really was no significant swelling. In regard to the ulcer there was a central opening with significant undermining in regard to the plantar foot ulcer on the right. After discussion with the patient as well as having him sign consent I verbally discussed with him the fact that I do think sharp debridement would be appropriate in this case. I explained that the ulcer is actually much larger than what he can see just on the surface and that subsequently if we are going to perform debridement we would need to exercise a much larger area and work to clean this wound well. This would effectively remove the callous completely but at the same time the visible wound would be much larger. Subsequently this in the end would actually be better for him in the long run but I did want him to be aware of what it would look like following. Patient in the end agree to the debridement obviously the risk of infection, bleeding, and pain are the most common potential issues that arise with bleeding obviously being the most common. Sharp debridement was performed and we have photographs of the chart of both  before and after he did have a significant debridement today and post debridement the wound did appear to be much better we did have some issues with the bleeding and therefore silver nitrate was utilized more to cauterize the wound. Electronic Signature(s) Signed: 03/09/2017 5:27:42 PM By: Worthy Keeler [name] Entered By: Worthy Keeler on 03/09/2017 17:24:25 Marich, [name] ([id number]) --------------------------------------------------------------------------------  Physician Orders Details Patient Name: [name], [name]. Date of Service: 03/09/2017 8:00 AM Medical Record Number: [id number] Patient Account Number: [id number] Date of Birth/Sex: Jun 25, 1950 (67 y.o. Male) Treating RN: Ahmed Prima Primary Care Provider: Benita Stabile Other Clinician: Referring Provider: Referral, Self Treating Provider/Extender: [name], [name] Weeks in Treatment: 0 Verbal / Phone Orders: Yes Clinician: Pinkerton, Debi Read Back and Verified: Yes Diagnosis Coding Wound Cleansing Wound #1 Right,Plantar Foot o Clean wound with Normal Saline. Skin Barriers/Peri-Wound Care Wound #1 Right,Plantar Foot o Skin Prep Primary Wound Dressing Wound #1 Right,Plantar Foot o Silvercel Non-Adherent Secondary Dressing Wound #1 Right,Plantar Foot o Dry Gauze o Conform/Kerlix o Coban o Foam - or felt for offloading Dressing Change Frequency Wound #1 Right,Plantar Foot o Change dressing every day. Follow-up Appointments Wound #1 Right,Plantar Foot o Return Appointment in 1 week. Off-Loading Wound #1 Right,Plantar Foot o Open toe surgical shoe with peg assist. Additional Orders / Instructions Wound #1 Right,Plantar Foot o Increase protein intake. Laboratory o Hemoglobin A1c (glycated HgB)/Hemoglobin.total in Blood (CHEM) oooo LOINC Code: 1607-3 oooo Convenience Name: Hb A1c -Method not specified AYAN, HEFFINGTON ([id number]) Radiology o X-ray, foot - right Electronic  Signature(s) Signed: 03/09/2017 4:06:48 PM By: Alric Quan Signed: 03/09/2017 5:27:42 PM By: Worthy Keeler [name] Entered By: Alric Quan on 03/09/2017 09:12:18 [place], [name] ([id number]) -------------------------------------------------------------------------------- Problem List Details Patient Name: [name], [name]. Date of Service: 03/09/2017 8:00 AM Medical Record Number: [id number] Patient Account Number: [id number] Date of Birth/Sex: 09-13-1950 (67 y.o. Male) Treating RN: Ahmed Prima Primary Care Provider: Benita Stabile Other Clinician: Referring Provider: Referral, Self Treating Provider/Extender: [name], [name] Weeks in Treatment: 0 Active Problems ICD-10 Encounter Code Description Active Date Diagnosis E11.621 Type 2 diabetes mellitus with foot ulcer 03/09/2017 Yes L97.512 Non-pressure chronic ulcer of other part of right foot with fat layer 03/09/2017 Yes exposed [place] (primary) hypertension 03/09/2017 Yes I48.2 Chronic atrial fibrillation 03/09/2017 Yes Inactive Problems Resolved Problems Electronic Signature(s) Signed: 03/09/2017 5:27:42 PM By: Worthy Keeler [name] Entered By: Worthy Keeler on 03/09/2017 17:16:52 Wallenstein, [name] ([id number]) -------------------------------------------------------------------------------- Progress Note Details Patient Name: [name]. Date of Service: 03/09/2017 8:00 AM Medical Record Number: [id number] Patient Account Number: [id number] Date of Birth/Sex: 29-May-1950 (67 y.o. Male) Treating RN: Ahmed Prima Primary Care Provider: Benita Stabile Other Clinician: Referring Provider: Referral, Self Treating Provider/Extender: [name], [name] Weeks in Treatment: 0 Subjective Chief Complaint Information obtained from Patient Right plantar foot ulcer History of Present Illness (HPI) The following HPI elements were documented for the patient's wound: Associated Signs and Symptoms: Patient has a history of  diabetes mellitus type II, hypertension, and atrial fibrillation. He also has a hammertoe deformity of the bilateral feet which seems to be causing pressure in the ball of his foot. 03/09/17 on evaluation today patient presents for his initial evaluation concerning an ulcer on the plantar aspect of his right foot which has been open he tells me for about three weeks. He has been seen at [facility] where they did perform debridement it appears according to a note on 02/22/17 where unfortunately it appears that his callous area began to break down into an ulcer after modifications have been added to his insulin. This had happened previously as well. However I do not have details of the severity of debridement at that point although it does not sound as if you the debridement was too significant based on what the patient is telling me that he still had a  lot of callous following debridement. With that being said they were going to look into altering his insoles to try to prevent further breakdown in the future. Nonetheless he has had foul odor discharge coming from the ulcer which is noted all the way back to that visit on [facility]. Patient states that this was concerning him more than anything else. He does have diabetes although he described this as "borderline diabetes" he is on metoprolol however along with lisinopril. Patient is not having any issues with pain in regard to his right plantar foot. Wound History Patient presents with 1 open wound that has been present for approximately 3 weeks. Patient has been treating wound in the following manner: mupirocin. Laboratory tests have not been performed in the last month. Patient reportedly has not tested positive for an antibiotic resistant organism. Patient reportedly has not tested positive for osteomyelitis. Patient reportedly has not had testing performed to evaluate circulation in the legs. Patient History Information  obtained from Patient. Allergies penicillin Family History Cancer - Siblings, Diabetes - Father, Heart Disease - Mother, Hypertension - Mother, No family history of Hereditary Spherocytosis, Kidney Disease, Lung Disease, Seizures, Stroke, Thyroid Problems, Tuberculosis. Social History Former smoker, Marital Status - Single, Alcohol Use - Never, Drug Use - No History, Caffeine Use - Moderate. Medical History Eyes Denies history of Cataracts, Glaucoma, Optic Neuritis [name], [name] ([id number]) Ear/Nose/Mouth/Throat Denies history of Chronic sinus problems/congestion, Middle ear problems Hematologic/Lymphatic Denies history of Anemia, Hemophilia, Human Immunodeficiency Virus, Lymphedema, Sickle Cell Disease Respiratory Denies history of Aspiration, Asthma, Chronic Obstructive Pulmonary Disease (COPD), Pneumothorax, Sleep Apnea, Tuberculosis Cardiovascular Patient has history of Arrhythmia - a fib, Hypertension Denies history of Angina, Congestive Heart Failure, Coronary Artery Disease, Deep Vein Thrombosis, Hypotension, Myocardial Infarction, Peripheral Arterial Disease, Peripheral Venous Disease, Phlebitis, Vasculitis Gastrointestinal Denies history of Cirrhosis , Colitis, Crohn s, Hepatitis A, Hepatitis B, Hepatitis C Endocrine Patient has history of Type II Diabetes Immunological Denies history of Lupus Erythematosus, Raynaud s, Scleroderma Integumentary (Skin) Denies history of History of Burn, History of pressure wounds Musculoskeletal Patient has history of Osteoarthritis Denies history of Gout, Rheumatoid Arthritis, Osteomyelitis Neurologic Denies history of Dementia, Neuropathy Oncologic Denies history of Received Chemotherapy, Received Radiation Patient is treated with Oral Agents. Blood sugar is not tested. Review of Systems (ROS) Constitutional Symptoms (General Health) The patient has no complaints or symptoms. Eyes The patient has no complaints or  symptoms. Ear/Nose/Mouth/Throat The patient has no complaints or symptoms. Hematologic/Lymphatic The patient has no complaints or symptoms. Respiratory The patient has no complaints or symptoms. Cardiovascular The patient has no complaints or symptoms. Gastrointestinal The patient has no complaints or symptoms. Endocrine Complains or has symptoms of Thyroid disease. Genitourinary The patient has no complaints or symptoms. Immunological The patient has no complaints or symptoms. Integumentary (Skin) The patient has no complaints or symptoms. Musculoskeletal The patient has no complaints or symptoms. Neurologic The patient has no complaints or symptoms. Oncologic The patient has no complaints or symptoms. Psychiatric The patient has no complaints or symptoms. [name], [name] ([id number]) Objective Constitutional patient is hypertensive.. pulse regular and within target range for patient.[name] respirations regular, non-labored and within target range for patient.[name] temperature within target range for patient.. Well-nourished and well-hydrated in no acute distress. Vitals Time Taken: 8:10 AM, Height: 73 in, Source: Measured, Weight: 231 lbs, Source: Measured, BMI: 30.5, Temperature: 97.6 F, Pulse: 89 bpm, Respiratory Rate: 18 breaths/min, Blood Pressure: 144/78 mmHg. Eyes conjunctiva clear no eyelid edema  noted. pupils equal round and reactive to light and accommodation. Ears, Nose, Mouth, and Throat no gross abnormality of ear auricles or external auditory canals. normal hearing noted during conversation. mucus membranes moist. Respiratory normal breathing without difficulty. clear to auscultation bilaterally. Cardiovascular regular rate and rhythm with normal S1, S2. 1+ dorsalis pedis/posterior tibialis pulses. no clubbing, cyanosis, significant edema, Gastrointestinal (GI) soft, non-tender, non-distended, +BS. no ventral hernia noted. Musculoskeletal normal gait and  posture. no significant deformity or arthritic changes, no loss or range of motion, no clubbing. Psychiatric this patient is able to make decisions and demonstrates good insight into disease process. Alert and Oriented x 3. pleasant and cooperative. General Notes: On evaluation today patient's blood pressure was elevated and this is something that we did recommend him follow up with his primary care provider concerning. He tells me he does see him on a regular basis and that his provider is "on top of things". With that being said I did not hear any evidence of atrial fibrillation during evaluation today. Patient did have pulses noted in the bilateral feet. There really was no significant swelling. In regard to the ulcer there was a central opening with significant undermining in regard to the plantar foot ulcer on the right. After discussion with the patient as well as having him sign consent I verbally discussed with him the fact that I do think sharp debridement would be appropriate in this case. I explained that the ulcer is actually much larger than what he can see just on the surface and that subsequently if we are going to perform debridement we would need to exercise a much larger area and work to clean this wound well. This would effectively remove the callous completely but at the same time the visible wound would be much larger. Subsequently this in the end would actually be better for him in the long run but I did want him to be aware of what it would look like following. Patient in the end agree to the debridement obviously the risk of infection, bleeding, and pain are the most common potential issues that arise with bleeding obviously being the most common. Sharp debridement was performed and we have photographs of the chart of both before and after he did have a significant debridement today and post debridement the wound did appear to be much better we did have some issues with the  bleeding and therefore silver nitrate was utilized more to cauterize the wound. MOMIN, MISKO ([id number]) Integumentary (Hair, Skin) Wound #1 status is Open. Original cause of wound was Gradually Appeared. The wound is located on the [place]. The wound measures 0.5cm length x 0.4cm width x 0.8cm depth; 0.157cm^2 area and 0.126cm^3 volume. There is Fat Layer (Subcutaneous Tissue) Exposed exposed. There is no tunneling noted, however, there is undermining starting at 10:00 and ending at 5:00 with a maximum distance of 0.6cm. There is a large amount of serous drainage noted. Foul odor after cleansing was noted. The wound margin is flat and intact. There is no granulation within the wound bed. There is a large (67- 100%) amount of necrotic tissue within the wound bed including Eschar and Adherent Slough. The periwound skin appearance exhibited: Callus, Maceration. The periwound skin appearance did not exhibit: Crepitus, Excoriation, Induration, Rash, Scarring, Dry/Scaly, Atrophie Blanche, Cyanosis, Ecchymosis, Hemosiderin Staining, Mottled, Pallor, Rubor, Erythema. Periwound temperature was noted as No Abnormality. The periwound has tenderness on palpation. Assessment Active Problems ICD-10 E11.621 - Type 2 diabetes mellitus with  foot ulcer L97.512 - Non-pressure chronic ulcer of other part of right foot with fat layer exposed I10 - Essential (primary) hypertension I48.2 - Chronic atrial fibrillation Procedures Wound #1 Pre-procedure diagnosis of Wound #1 is a Diabetic Wound/Ulcer of the Lower Extremity located on the [place] .Severity of Tissue Pre Debridement is: Fat layer exposed. There was a Skin/Subcutaneous Tissue Debridement ([id number]) debridement with total area of 0.2 sq cm performed by [name], [name] E., [name]. with the following instrument(s): Curette, Forceps, and Scissors to remove Viable and Non-Viable tissue/material including Exudate, Fibrin/Slough,  and Subcutaneous after achieving pain control using Lidocaine 4% Topical Solution. A time out was conducted at 08:47, prior to the start of the procedure. A Minimum amount of bleeding was controlled with Pressure. The procedure was tolerated well with a pain level of 0 throughout and a pain level of 0 following the procedure. Post Debridement Measurements: 1.7cm length x 1.4cm width x 0.2cm depth; 0.374cm^3 volume. Character of Wound/Ulcer Post Debridement requires further debridement. Severity of Tissue Post Debridement is: Fat layer exposed. Post procedure Diagnosis Wound #1: Same as Pre-Procedure Plan Wound Cleansing: Wound #1 Right,Plantar Foot: Clean wound with Normal Saline. Skin Barriers/Peri-Wound Care: Wound #1 Right,Plantar Foot: Skin Prep Primary Wound Dressing: [name], [name]. ([id number]) Wound #1 Right,Plantar Foot: Silvercel Non-Adherent Secondary Dressing: Wound #1 Right,Plantar Foot: Dry Gauze Conform/Kerlix Coban Foam - or felt for offloading Dressing Change Frequency: Wound #1 Right,Plantar Foot: Change dressing every day. Follow-up Appointments: Wound #1 Right,Plantar Foot: Return Appointment in 1 week. Off-Loading: Wound #1 Right,Plantar Foot: Open toe surgical shoe with peg assist. Additional Orders / Instructions: Wound #1 Right,Plantar Foot: Increase protein intake. Laboratory ordered were: Hb A1c -Method not specified Radiology ordered were: X-ray, foot - right This week due to the bleeding as well is due to the drainage I'm gonna recommend the silver alginate dressing for the time being. I'm also going to send him for an x-ray of the right foot and were to ensure there is no evidence of osteomyelitis or deeper issue at this time. Patient is in agreement with that plan as well. We will subtly see him for a return visit in one weeks time to see were things stand here it I am going to place him in a peg assist shoe in order to offload the area he does  not think she would be able to tolerate a total contact cast which of course would be the optimal way to go with this. He also states he had a front offloading shoe which he just threw in the trash because there was no way he could wear that it made his knee and back hurt. We will see how things do over the next week. Patient was actually pleased with how everything felt post debridement and overall I'm hopeful that this will begin to heal more appropriately. I did inquire if there any remaining questions patient did not have any at this time. Please see above for specific wound care orders. We will see patient for re-evaluation in 1 week(s) here in the clinic. If anything worsens or changes patient will contact our office for additional recommendations. Electronic Signature(s) Signed: 03/09/2017 5:27:42 PM By: Worthy Keeler [name] Entered By: Worthy Keeler on 03/09/2017 17:26:08 SHIVAM, MESTAS ([id number]) -------------------------------------------------------------------------------- ROS/PFSH Details Patient Name: YANCEY, PEDLEY Date of Service: 03/09/2017 8:00 AM Medical Record Number: [id number] Patient Account Number: [id number] Date of Birth/Sex: 12-15-1950 (67 y.o. Male) Treating RN: Montey Hora Primary Care Provider: [facility],  [facility] Other Clinician: Referring Provider: Referral, Self Treating Provider/Extender: [name], [name] Weeks in Treatment: 0 Information Obtained From Patient Wound History Do you currently have one or more open woundso Yes How many open wounds do you currently haveo 1 Approximately how long have you had your woundso 3 weeks How have you been treating your wound(s) until nowo mupirocin Has your wound(s) ever healed and then re-openedo No Have you had any lab work done in the past montho No Have you tested positive for an antibiotic resistant organism (MRSA, VRE)o No Have you tested positive for osteomyelitis (bone infection)o No Have you had any tests  for circulation on your legso No Endocrine Complaints and Symptoms: Positive for: Thyroid disease Medical History: Positive for: Type II Diabetes Treated with: Oral agents Blood sugar tested every day: No Constitutional Symptoms (General Health) Complaints and Symptoms: No Complaints or Symptoms Eyes Complaints and Symptoms: No Complaints or Symptoms Medical History: Negative for: Cataracts; Glaucoma; Optic Neuritis Ear/Nose/Mouth/Throat Complaints and Symptoms: No Complaints or Symptoms Medical History: Negative for: Chronic sinus problems/congestion; Middle ear problems Hematologic/Lymphatic Complaints and Symptoms: No Complaints or Symptoms Siever, Rjay J. ([id number]) Medical History: Negative for: Anemia; Hemophilia; Human Immunodeficiency Virus; Lymphedema; Sickle Cell Disease Respiratory Complaints and Symptoms: No Complaints or Symptoms Medical History: Negative for: Aspiration; Asthma; Chronic Obstructive Pulmonary Disease (COPD); Pneumothorax; Sleep Apnea; Tuberculosis Cardiovascular Complaints and Symptoms: No Complaints or Symptoms Medical History: Positive for: Arrhythmia - a fib; Hypertension Negative for: Angina; Congestive Heart Failure; Coronary Artery Disease; Deep Vein Thrombosis; Hypotension; Myocardial Infarction; Peripheral Arterial Disease; Peripheral Venous Disease; Phlebitis; Vasculitis Gastrointestinal Complaints and Symptoms: No Complaints or Symptoms Medical History: Negative for: Cirrhosis ; Colitis; Crohnos; Hepatitis A; Hepatitis B; Hepatitis C Genitourinary Complaints and Symptoms: No Complaints or Symptoms Immunological Complaints and Symptoms: No Complaints or Symptoms Medical History: Negative for: Lupus Erythematosus; Raynaudos; Scleroderma Integumentary (Skin) Complaints and Symptoms: No Complaints or Symptoms Medical History: Negative for: History of Burn; History of pressure wounds Musculoskeletal Complaints and  Symptoms: No Complaints or Symptoms Medical History: Positive for: Osteoarthritis Negative for: Gout; Rheumatoid Arthritis; Osteomyelitis Harland, Izaiah J. ([id number]) Neurologic Complaints and Symptoms: No Complaints or Symptoms Medical History: Negative for: Dementia; Neuropathy Oncologic Complaints and Symptoms: No Complaints or Symptoms Medical History: Negative for: Received Chemotherapy; Received Radiation Psychiatric Complaints and Symptoms: No Complaints or Symptoms Immunizations Pneumococcal Vaccine: Received Pneumococcal Vaccination: Yes Immunization Notes: up to date Implantable Devices Family and Social History Cancer: Yes - Siblings; Diabetes: Yes - Father; Heart Disease: Yes - Mother; Hereditary Spherocytosis: No; Hypertension: Yes - Mother; Kidney Disease: No; Lung Disease: No; Seizures: No; Stroke: No; Thyroid Problems: No; Tuberculosis: No; Former smoker; Marital Status - Single; Alcohol Use: Never; Drug Use: No History; Caffeine Use: Moderate; Financial Concerns: No; Food, Clothing or Shelter Needs: No; Support System Lacking: No; Transportation Concerns: No; Advanced Directives: No; Patient does not want information on Advanced Directives Electronic Signature(s) Signed: 03/09/2017 4:30:06 PM By: Montey Hora Signed: 03/09/2017 5:27:42 PM By: Worthy Keeler [name] Entered By: Montey Hora on 03/09/2017 08:20:14 Murata, [name] ([id number]) -------------------------------------------------------------------------------- SuperBill Details Patient Name: [name]. Date of Service: 03/09/2017 Medical Record Number: [id number] Patient Account Number: [id number] Date of Birth/Sex: 1950/12/19 (67 y.o. Male) Treating RN: Ahmed Prima Primary Care Provider: Benita Stabile Other Clinician: Referring Provider: Referral, Self Treating Provider/Extender: [name], [name] Weeks in Treatment: 0 Diagnosis Coding ICD-10 Codes Code Description E11.621 Type 2  diabetes mellitus with foot ulcer L97.512 Non-pressure chronic ulcer of other part of  right foot with fat layer exposed [place] (primary) hypertension I48.2 Chronic atrial fibrillation Facility Procedures CPT4 Code: 98921194 Description: 17408 - WOUND CARE VISIT-LEV 3 EST PT Modifier: Quantity: 1 CPT4 Code: [id number] Description: 31497 - DEB SUBQ TISSUE 20 SQ CM/< ICD-10 Diagnosis Description L97.512 Non-pressure chronic ulcer of other part of right foot with fa Modifier: t layer exposed Quantity: 1 Physician Procedures CPT4 Code: 0263785 Description: 88502 - WC PHYS LEVEL 4 - NEW PT ICD-10 Diagnosis Description E11.621 Type 2 diabetes mellitus with foot ulcer 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

## 2017-03-15 ENCOUNTER — Ambulatory Visit: Payer: PPO | Admitting: Podiatry

## 2017-03-16 ENCOUNTER — Encounter: Payer: PPO | Admitting: Physician Assistant

## 2017-03-16 DIAGNOSIS — E11621 Type 2 diabetes mellitus with foot ulcer: Secondary | ICD-10-CM | POA: Diagnosis not present

## 2017-03-16 DIAGNOSIS — L97512 Non-pressure chronic ulcer of other part of right foot with fat layer exposed: Secondary | ICD-10-CM | POA: Diagnosis not present

## 2017-03-18 NOTE — Progress Notes (Signed)
JAHLEEL, STROSCHEIN ([id number]) Visit Report for 03/16/2017 Chief Complaint Document Details Patient Name: [name], [name]. Date of Service: 03/16/2017 11:00 AM Medical Record Number: [id number] Patient Account Number: [id number] Date of Birth/Sex: [birth year] (67 y.o. Male) Treating RN: Ahmed Prima Primary Care Provider: Benita Stabile Other Clinician: Referring Provider: Benita Stabile Treating Provider/Extender: Melburn Hake, HOYT Weeks in Treatment: 1 Information Obtained from: Patient Chief Complaint Right plantar foot ulcer Electronic Signature(s) Signed: 03/16/2017 5:35:20 PM By: Worthy Keeler PA-C Entered By: Worthy Keeler on 03/16/2017 11:21:58 Radle, [name] ([id number]) -------------------------------------------------------------------------------- Debridement Details Patient Name: [name]. Date of Service: 03/16/2017 11:00 AM Medical Record Number: [id number] Patient Account Number: [id number] Date of Birth/Sex: January 22, 1950 (67 y.o. Male) Treating RN: Ahmed Prima Primary Care Provider: TATE, Sharlet Salina Other Clinician: Referring Provider: Benita Stabile Treating Provider/Extender: STONE III, HOYT Weeks in Treatment: 1 Debridement Performed for Wound #1 Right,Plantar Foot Assessment: Performed By: Physician STONE III, HOYT E., PA-C Debridement: Open Wound/Selective Severity of Tissue Pre Fat layer exposed Debridement: Debridement Description: Selective Pre-procedure Verification/Time Yes - 11:25 Out Taken: Start Time: 11:26 Pain Control: Lidocaine 4% Topical Solution Level: Non-Viable Tissue Total Area Debrided (L x W): 0.5 (cm) x 0.3 (cm) = 0.15 (cm) Tissue and other material Non-Viable, Callus debrided: Instrument: Curette Bleeding: Minimum Hemostasis Achieved: Pressure End Time: 11:30 Procedural Pain: 0 Post Procedural Pain: 0 Response to Treatment: Procedure was tolerated well Post Debridement Measurements of Total Wound Length: (cm) 0.5 Width: (cm)  0.3 Depth: (cm) 0.2 Volume: (cm) 0.024 Character of Wound/Ulcer Post Debridement: Requires Further Debridement Severity of Tissue Post Debridement: Fat layer exposed Post Procedure Diagnosis Same as Pre-procedure Electronic Signature(s) Signed: 03/16/2017 5:35:20 PM By: Worthy Keeler PA-C Signed: 03/17/2017 4:30:35 PM By: Alric Quan Entered By: Alric Quan on 03/16/2017 11:29:00 [place], [name] ([id number]) -------------------------------------------------------------------------------- HPI Details Patient Name: [name]. Date of Service: 03/16/2017 11:00 AM Medical Record Number: [id number] Patient Account Number: [id number] Date of Birth/Sex: 1950/02/07 (67 y.o. Male) Treating RN: Ahmed Prima Primary Care Provider: Benita Stabile Other Clinician: Referring Provider: Benita Stabile Treating Provider/Extender: STONE III, HOYT Weeks in Treatment: 1 History of Present Illness Associated Signs and Symptoms: Patient has a history of diabetes mellitus type II, hypertension, and atrial fibrillation. He also has a hammertoe deformity of the bilateral feet which seems to be causing pressure in the ball of his foot. HPI Description: 03/09/17 on evaluation today patient presents for his initial evaluation concerning an ulcer on the plantar aspect of his right foot which has been open he tells me for about three weeks. He has been seen at [facility] where they did perform debridement it appears according to a note on 02/22/17 where unfortunately it appears that his callous area began to break down into an ulcer after modifications have been added to his insulin. This had happened previously as well. However I do not have details of the severity of debridement at that point although it does not sound as if you the debridement was too significant based on what the patient is telling me that he still had a lot of callous following debridement. With that being said they were going  to look into altering his insoles to try to prevent further breakdown in the future. Nonetheless he has had foul odor discharge coming from the ulcer which is noted all the way back to that visit [facility]. Patient states that this was concerning him more than anything else. He does  have diabetes although he described this as "borderline diabetes" he is on metoprolol however along with lisinopril. Patient is not having any issues with pain in regard to his right plantar foot. 03/16/17 on evaluation today patient appears to be doing much better in regard to his plantar foot ulcer. He actually tells me that he loves the peg assist offloading shoe and that he hasn't had any pain in the ulcer area from the callous since I worked on it last week. Overall he is extremely happy with how things have progressed. Likewise the wound bed has no slough noted there is no evidence of infection and it looks excellent. I did receive the results of his hemoglobin A1c which showed a value of 5.9 which was elevated but actually rather well. Subsequently I did also receive the x-ray of his foot which showed diffuse degenerative change but no underlying acute bony abnormality. Electronic Signature(s) Signed: 03/16/2017 5:35:20 PM By: Worthy Keeler PA-C Entered By: Worthy Keeler on 03/16/2017 17:22:13 Mccaughan, [name] ([id number]) -------------------------------------------------------------------------------- Physical Exam Details Patient Name: [name]. Date of Service: 03/16/2017 11:00 AM Medical Record Number: [id number] Patient Account Number: [id number] Date of Birth/Sex: [birth year] (67 y.o. Male) Treating RN: Ahmed Prima Primary Care Provider: Benita Stabile Other Clinician: Referring Provider: TATE, Sharlet Salina Treating Provider/Extender: STONE III, HOYT Weeks in Treatment: 1 Constitutional Well-nourished and well-hydrated in no acute distress. Respiratory normal breathing without  difficulty. Psychiatric this patient is able to make decisions and demonstrates good insight into disease process. Alert and Oriented x 3. pleasant and cooperative. Notes Currently patient has slough that was cleaned off with saline and gauze but otherwise no sharp debridement was noted at this point. He has been utilizing the offloading shoe without any complication and again this is the peg assist shoe. Electronic Signature(s) Signed: 03/16/2017 5:35:20 PM By: Worthy Keeler PA-C Entered By: Worthy Keeler on 03/16/2017 17:20:39 Ives, [name] ([id number]) -------------------------------------------------------------------------------- Physician Orders Details Patient Name: FAUSTO, SAMPEDRO Date of Service: 03/16/2017 11:00 AM Medical Record Number: [id number] Patient Account Number: [id number] Date of Birth/Sex: 1950/09/20 (67 y.o. Male) Treating RN: Ahmed Prima Primary Care Provider: TATE, Sharlet Salina Other Clinician: Referring Provider: Benita Stabile Treating Provider/Extender: Melburn Hake, HOYT Weeks in Treatment: 1 Verbal / Phone Orders: Yes Clinician: Pinkerton, Debi Read Back and Verified: Yes Diagnosis Coding ICD-10 Coding Code Description E11.621 Type 2 diabetes mellitus with foot ulcer L97.512 Non-pressure chronic ulcer of other part of right foot with fat layer exposed I10 Essential (primary) hypertension I48.2 Chronic atrial fibrillation Wound Cleansing Wound #1 Right,Plantar Foot o Clean wound with Normal Saline. Skin Barriers/Peri-Wound Care Wound #1 Right,Plantar Foot o Skin Prep Primary Wound Dressing Wound #1 Right,Plantar Foot o Silvercel Non-Adherent Secondary Dressing Wound #1 Right,Plantar Foot o Dry Gauze o Conform/Kerlix o Coban o Foam - or felt for offloading Dressing Change Frequency Wound #1 Right,Plantar Foot o Change dressing every day. Follow-up Appointments Wound #1 Right,Plantar Foot o Return Appointment in 1  week. Off-Loading Wound #1 Right,Plantar Foot o Open toe surgical shoe with peg assist. Additional Orders / Instructions Wound #1 Right,Plantar Foot [place], Lonza J. ([id number]) o Increase protein intake. Patient Medications Allergies: penicillin Notifications Medication Indication Start End lidocaine DOSE 1 - topical 4 % cream - 1 cream topical Electronic Signature(s) Signed: 03/16/2017 5:35:20 PM By: Worthy Keeler PA-C Signed: 03/17/2017 4:30:35 PM By: Alric Quan Entered By: Alric Quan on 03/16/2017 11:30:24 ADAIAH, MORKEN ([id number]) -------------------------------------------------------------------------------- Prescription 03/16/2017 Patient Name: [name] Provider:  STONE III, HOYT PA-C Date of Birth: 1950-06-08 NPI#: [id number] Sex: Jerilynn Mages DEA#: [id number] Phone #: [phone] License #: Patient Address: [facility] [place], [place] [address] [address] [place], [place] [address] [phone] Allergies penicillin Medication Medication: Route: Strength: Form: lidocaine 4 % topical cream topical 4% cream Class: TOPICAL LOCAL ANESTHETICS Dose: Frequency / Time: Indication: 1 1 cream topical Number of Refills: Number of Units: 0 Generic Substitution: Start Date: End Date: One Time Use: Substitution Permitted No Note to Pharmacy: Signature(s): Date(s): Electronic Signature(s) Signed: 03/16/2017 5:35:20 PM By: Worthy Keeler PA-C Signed: 03/17/2017 4:30:35 PM By: Alric Quan Entered By: Alric Quan on 03/16/2017 11:30:25 Garton, [name] ([id number]) --------------------------------------------------------------------------------  Problem List Details Patient Name: [name]. Date of Service: 03/16/2017 11:00 AM Medical Record Number: [id number] Patient Account Number: [id number] Date of Birth/Sex: 1951/01/08 (67 y.o.  Male) Treating RN: Ahmed Prima Primary Care Provider: Benita Stabile Other Clinician: Referring Provider: Benita Stabile Treating Provider/Extender: Melburn Hake, HOYT Weeks in Treatment: 1 Active Problems ICD-10 Encounter Code Description Active Date Diagnosis E11.621 Type 2 diabetes mellitus with foot ulcer 03/09/2017 Yes L97.512 Non-pressure chronic ulcer of other part of right foot with fat layer 03/09/2017 Yes exposed [place] (primary) hypertension 03/09/2017 Yes I48.2 Chronic atrial fibrillation 03/09/2017 Yes Inactive Problems Resolved Problems Electronic Signature(s) Signed: 03/16/2017 5:35:20 PM By: Worthy Keeler PA-C Entered By: Worthy Keeler on 03/16/2017 11:21:36 Guthmiller, [name] ([id number]) -------------------------------------------------------------------------------- Progress Note Details Patient Name: [name]. Date of Service: 03/16/2017 11:00 AM Medical Record Number: [id number] Patient Account Number: [id number] Date of Birth/Sex: 10/17/1950 (67 y.o. Male) Treating RN: Ahmed Prima Primary Care Provider: Benita Stabile Other Clinician: Referring Provider: Benita Stabile Treating Provider/Extender: STONE III, HOYT Weeks in Treatment: 1 Subjective Chief Complaint Information obtained from Patient Right plantar foot ulcer History of Present Illness (HPI) The following HPI elements were documented for the patient's wound: Associated Signs and Symptoms: Patient has a history of diabetes mellitus type II, hypertension, and atrial fibrillation. He also has a hammertoe deformity of the bilateral feet which seems to be causing pressure in the ball of his foot. 03/09/17 on evaluation today patient presents for his initial evaluation concerning an ulcer on the plantar aspect of his right foot which has been open he tells me for about three weeks. He has been seen at [facility] where they did perform debridement it appears according to a note on 02/22/17 where  unfortunately it appears that his callous area began to break down into an ulcer after modifications have been added to his insulin. This had happened previously as well. However I do not have details of the severity of debridement at that point although it does not sound as if you the debridement was too significant based on what the patient is telling me that he still had a lot of callous following debridement. With that being said they were going to look into altering his insoles to try to prevent further breakdown in the future. Nonetheless he has had foul odor discharge coming from the ulcer which is noted all the way back to that [facility] Patient states that this was concerning him more than anything else. He does have diabetes although he described this as "borderline diabetes" he is on metoprolol however along with lisinopril. Patient is not having any issues with pain in regard to his right plantar foot. 03/16/17  on evaluation today patient appears to be doing much better in regard to his plantar foot ulcer. He actually tells me that he loves the peg assist offloading shoe and that he hasn't had any pain in the ulcer area from the callous since I worked on it last week. Overall he is extremely happy with how things have progressed. Likewise the wound bed has no slough noted there is no evidence of infection and it looks excellent. I did receive the results of his hemoglobin A1c which showed a value of 5.9 which was elevated but actually rather well. Subsequently I did also receive the x-ray of his foot which showed diffuse degenerative change but no underlying acute bony abnormality. Patient History Information obtained from Patient. Family History Cancer - Siblings, Diabetes - Father, Heart Disease - Mother, Hypertension - Mother, No family history of Hereditary Spherocytosis, Kidney Disease, Lung Disease, Seizures, Stroke, Thyroid  Problems, Tuberculosis. Social History Former smoker, Marital Status - Single, Alcohol Use - Never, Drug Use - No History, Caffeine Use - Moderate. Review of Systems (ROS) Constitutional Symptoms (General Health) Denies complaints or symptoms of Fever, Chills. Respiratory ERIBERTO, FELCH. ([id number]) The patient has no complaints or symptoms. Cardiovascular The patient has no complaints or symptoms. Psychiatric The patient has no complaints or symptoms. Objective Constitutional Well-nourished and well-hydrated in no acute distress. Vitals Time Taken: 11:05 AM, Height: 73 in, Weight: 231 lbs, BMI: 30.5, Temperature: 97.8 F, Pulse: 90 bpm, Respiratory Rate: 18 breaths/min, Blood Pressure: 149/77 mmHg. Respiratory normal breathing without difficulty. Psychiatric this patient is able to make decisions and demonstrates good insight into disease process. Alert and Oriented x 3. pleasant and cooperative. General Notes: Currently patient has slough that was cleaned off with saline and gauze but otherwise no sharp debridement was noted at this point. He has been utilizing the offloading shoe without any complication and again this is the peg assist shoe. Integumentary (Hair, Skin) Wound #1 status is Open. Original cause of wound was Gradually Appeared. The wound is located on the [place]. The wound measures 0.5cm length x 0.3cm width x 0.1cm depth; 0.118cm^2 area and 0.012cm^3 volume. There is Fat Layer (Subcutaneous Tissue) Exposed exposed. There is no tunneling or undermining noted. There is a large amount of serous drainage noted. Foul odor after cleansing was noted. The wound margin is flat and intact. There is small (1-33%) pink granulation within the wound bed. There is a large (67-100%) amount of necrotic tissue within the wound bed including Adherent Slough. The periwound skin appearance exhibited: Callus, Maceration. The periwound skin appearance did not exhibit:  Crepitus, Excoriation, Induration, Rash, Scarring, Dry/Scaly, Atrophie Blanche, Cyanosis, Ecchymosis, Hemosiderin Staining, Mottled, Pallor, Rubor, Erythema. Periwound temperature was noted as No Abnormality. The periwound has tenderness on palpation. 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 ([id number]) Procedures Wound #1 Pre-procedure diagnosis of Wound #1 is a Diabetic Wound/Ulcer of the Lower Extremity located on the [place] .Severity of Tissue Pre Debridement is: Fat layer exposed. There was a Non-Viable Tissue Open Wound/Selective [id number]) debridement with total area of 0.15 sq cm performed by STONE III, HOYT E., PA-C. with the following instrument(s): Curette to remove Non-Viable tissue/material including Callus after achieving pain control using Lidocaine 4% Topical Solution. A time out was conducted at 11:25, prior to the start  of the procedure. A Minimum amount of bleeding was controlled with Pressure. The procedure was tolerated well with a pain level of 0 throughout and a pain level of 0 following the procedure. Post Debridement Measurements: 0.5cm length x 0.3cm width x 0.2cm depth; 0.024cm^3 volume. Character of Wound/Ulcer Post Debridement requires further debridement. Severity of Tissue Post Debridement is: Fat layer exposed. Post procedure Diagnosis Wound #1: Same as Pre-Procedure Plan Wound Cleansing: Wound #1 Right,Plantar Foot: Clean wound with Normal Saline. Skin Barriers/Peri-Wound Care: Wound #1 Right,Plantar Foot: Skin Prep Primary Wound Dressing: Wound #1 Right,Plantar Foot: [place] Non-Adherent Secondary Dressing: Wound #1 Right,Plantar Foot: Dry Gauze Conform/Kerlix Coban Foam - or felt for offloading Dressing  Change Frequency: Wound #1 Right,Plantar Foot: Change dressing every day. Follow-up Appointments: Wound #1 Right,Plantar Foot: Return Appointment in 1 week. Off-Loading: Wound #1 Right,Plantar Foot: Open toe surgical shoe with peg assist. Additional Orders / Instructions: Wound #1 Right,Plantar Foot: Increase protein intake. The following medication(s) was prescribed: lidocaine topical 4 % cream 1 1 cream topical was prescribed at facility YOGI, ARTHER ([id number]) Currently I'm going to recommend that we continue with the Current wound care measures for the next week. I did pare away some of the callous selectively in order to clean away the wound bed and just ensure this continues to heal but otherwise there is really no significant intervention needed today. We will see how things are in one weeks time the way he is healing this may heal in one-two weeks easily. Please see above for specific wound care orders. We will see patient for re-evaluation in 1 week(s) here in the clinic. If anything worsens or changes patient will contact our office for additional recommendations. Electronic Signature(s) Signed: 03/16/2017 5:35:20 PM By: Worthy Keeler PA-C Entered By: Worthy Keeler on 03/16/2017 17:22:34 Connett, [name] ([id number]) -------------------------------------------------------------------------------- ROS/PFSH Details Patient Name: RICO, MASSAR Date of Service: 03/16/2017 11:00 AM Medical Record Number: [id number] Patient Account Number: [id number] Date of Birth/Sex: 1950-11-05 (67 y.o. Male) Treating RN: Ahmed Prima Primary Care Provider: TATE, [facility] Other Clinician: Referring Provider: Benita Stabile Treating Provider/Extender: STONE III, HOYT Weeks in Treatment: 1 Information Obtained From Patient Wound History Do you currently have one or more open woundso Yes How many open wounds do you currently haveo 1 Approximately how long have you had your woundso 3  weeks How have you been treating your wound(s) until nowo mupirocin Has your wound(s) ever healed and then re-openedo No Have you had any lab work done in the past montho No Have you tested positive for an antibiotic resistant organism (MRSA, VRE)o No Have you tested positive for osteomyelitis (bone infection)o No Have you had any tests for circulation on your legso No Constitutional Symptoms (General Health) Complaints and Symptoms: Negative for: Fever; Chills Eyes Medical History: Negative for: Cataracts; Glaucoma; Optic Neuritis Ear/Nose/Mouth/Throat Medical History: Negative for: Chronic sinus problems/congestion; Middle ear problems Hematologic/Lymphatic Medical History: Negative for: Anemia; Hemophilia; Human Immunodeficiency Virus; Lymphedema; Sickle Cell Disease Respiratory Complaints and Symptoms: No Complaints or Symptoms Medical History: Negative for: Aspiration; Asthma; Chronic Obstructive Pulmonary Disease (COPD); Pneumothorax; Sleep Apnea; Tuberculosis Cardiovascular Complaints and Symptoms: No Complaints or Symptoms Medical History: Positive for: Arrhythmia - a fib; Hypertension Negative for: Angina; Congestive Heart Failure; Coronary Artery Disease; Deep Vein Thrombosis; Hypotension; Myocardial Bufkin, Clell J. ([id number]) Infarction; Peripheral Arterial Disease; Peripheral Venous Disease; Phlebitis; Vasculitis Gastrointestinal Medical History: Negative for: Cirrhosis ; Colitis; Crohnos; Hepatitis A; Hepatitis B; Hepatitis C Endocrine Medical History: Positive for:  Type II Diabetes Treated with: Oral agents Blood sugar tested every day: No Immunological Medical History: Negative for: Lupus Erythematosus; Raynaudos; Scleroderma Integumentary (Skin) Medical History: Negative for: History of Burn; History of pressure wounds Musculoskeletal Medical History: Positive for: Osteoarthritis Negative for: Gout; Rheumatoid Arthritis;  Osteomyelitis Neurologic Medical History: Negative for: Dementia; Neuropathy Oncologic Medical History: Negative for: Received Chemotherapy; Received Radiation Psychiatric Complaints and Symptoms: No Complaints or Symptoms Immunizations Pneumococcal Vaccine: Received Pneumococcal Vaccination: Yes Immunization Notes: up to date Implantable Devices Family and Social History Cancer: Yes - Siblings; Diabetes: Yes - Father; Heart Disease: Yes - Mother; Hereditary Spherocytosis: No; Hypertension: Yes - Mother; Kidney Disease: No; Lung Disease: No; Seizures: No; Stroke: No; Thyroid Problems: No; Tuberculosis: No; Former smoker; Marital Status - Single; Alcohol Use: Never; Drug Use: No History; Caffeine Use: Moderate; Financial DESMIN, DALEO ([id number]) Concerns: No; Food, Clothing or Shelter Needs: No; Support System Lacking: No; Transportation Concerns: No; Advanced Directives: No; Patient does not want information on Advanced Directives Physician Affirmation I have reviewed and agree with the above information. Electronic Signature(s) Signed: 03/16/2017 5:35:20 PM By: Worthy Keeler PA-C Signed: 03/17/2017 4:30:35 PM By: Alric Quan Entered By: Worthy Keeler on 03/16/2017 17:20:11 Noell, [name] ([id number]) -------------------------------------------------------------------------------- SuperBill Details Patient Name: [name] Date of Service: 03/16/2017 Medical Record Number: [id number] Patient Account Number: [id number] Date of Birth/Sex: Aug 20, 1950 (67 y.o. Male) Treating RN: Ahmed Prima Primary Care Provider: TATE, Sharlet Salina Other Clinician: Referring Provider: Benita Stabile Treating Provider/Extender: Melburn Hake, HOYT Weeks in Treatment: 1 Diagnosis Coding ICD-10 Codes Code Description E11.621 Type 2 diabetes mellitus with foot ulcer L97.512 Non-pressure chronic ulcer of other part of right foot with fat layer exposed I10 Essential (primary)  hypertension I48.2 Chronic atrial fibrillation Facility Procedures CPT4 Code: [id number] Description: [phone] - DEBRIDE WOUND 1ST 20 SQ CM OR < ICD-10 Diagnosis Description L97.512 Non-pressure chronic ulcer of other part of right foot with fat Modifier: layer exposed Quantity: 1 Physician Procedures CPT4 Code: [id number] Description: 62035 - WC PHYS DEBR WO ANESTH 20 SQ CM ICD-10 Diagnosis Description L97.512 Non-pressure chronic ulcer of other part of right foot with fat Modifier: layer exposed Quantity: 1 Electronic Signature(s) Signed: 03/16/2017 5:35:20 PM By: Worthy Keeler PA-C Entered By: Worthy Keeler on 03/16/2017 17:22:54

## 2017-03-21 NOTE — Progress Notes (Signed)
TADEUSZ, STAHL ([id number]) Visit Report for 03/16/2017 Arrival Information Details Patient Name: [name], [name]. Date of Service: 03/16/2017 11:00 AM Medical Record Number: [id number] Patient Account Number: [id number] Date of Birth/Sex: 12-29-1950 (67 y.o. Male) Treating RN: Montey Hora Primary Care Raiza Kiesel: TATE, Sharlet Salina Other Clinician: Referring Xayla Puzio: Benita Stabile Treating Ellieanna Funderburg/Extender: Melburn Hake, HOYT Weeks in Treatment: 1 Visit Information History Since Last Visit Added or deleted any medications: No Patient Arrived: Ambulatory Any new allergies or adverse reactions: No Arrival Time: 11:03 Had a fall or experienced change in No Accompanied By: self activities of daily living that may affect Transfer Assistance: None risk of falls: Patient Identification Verified: Yes Signs or symptoms of abuse/neglect since last visito No Secondary Verification Process Completed: Yes Hospitalized since last visit: No Patient Has Alerts: Yes Has Dressing in Place as Prescribed: Yes Patient Alerts: DMII Pain Present Now: No Electronic Signature(s) Signed: 03/16/2017 4:46:41 PM By: Montey Hora Entered By: Montey Hora on 03/16/2017 11:05:03 [name], [name] ([id number]) -------------------------------------------------------------------------------- Encounter Discharge Information Details Patient Name: [name]. Date of Service: 03/16/2017 11:00 AM Medical Record Number: [id number] Patient Account Number: [id number] Date of Birth/Sex: 08-25-1950 (67 y.o. Male) Treating RN: Ahmed Prima Primary Care Jonathen Rathman: Benita Stabile Other Clinician: Referring Kaileb Monsanto: Benita Stabile Treating Katharin Schneider/Extender: Melburn Hake, HOYT Weeks in Treatment: 1 Encounter Discharge Information Items Discharge Pain Level: 0 Discharge Condition: Stable Ambulatory Status: Ambulatory Discharge Destination: Home Transportation: Private Auto Accompanied By: self Schedule Follow-up Appointment:  Yes Medication Reconciliation completed and No provided to Patient/Care Laster Appling: Provided on Clinical Summary of Care: 03/16/2017 Form Type Recipient Paper Patient WT Electronic Signature(s) Signed: 03/19/2017 5:56:09 PM By: Roger Shelter Entered By: Roger Shelter on 03/16/2017 11:42:58 Masterson, [name] ([id number]) -------------------------------------------------------------------------------- Lower Extremity Assessment Details Patient Name: [name], [name]. Date of Service: 03/16/2017 11:00 AM Medical Record Number: [id number] Patient Account Number: [id number] Date of Birth/Sex: [birth year] (67 y.o. Male) Treating RN: Montey Hora Primary Care Mirna Sutcliffe: Benita Stabile Other Clinician: Referring Dianelly Ferran: Benita Stabile Treating Tobby Fawcett/Extender: STONE III, HOYT Weeks in Treatment: 1 Vascular Assessment Pulses: Dorsalis Pedis Palpable: [Right:Yes] Posterior Tibial Extremity colors, hair growth, and conditions: Extremity Color: [Right:Normal] Hair Growth on Extremity: [Right:Yes] Temperature of Extremity: [Right:Warm] Capillary Refill: [Right:< 3 seconds] Electronic Signature(s) Signed: 03/16/2017 4:46:41 PM By: Montey Hora Entered By: Montey Hora on 03/16/2017 11:09:52 Shamblin, [name] ([id number]) -------------------------------------------------------------------------------- Multi Wound Chart Details Patient Name: [name]. Date of Service: 03/16/2017 11:00 AM Medical Record Number: [id number] Patient Account Number: [id number] Date of Birth/Sex: 04-Oct-1950 (67 y.o. Male) Treating RN: Ahmed Prima Primary Care Adriel Kessen: TATE, Sharlet Salina Other Clinician: Referring Akaysha Cobern: Benita Stabile Treating Jennilee Demarco/Extender: STONE III, HOYT Weeks in Treatment: 1 Vital Signs Height(in): 73 Pulse(bpm): 90 Weight(lbs): 231 Blood Pressure(mmHg): 149/77 Body Mass Index(BMI): 30 Temperature(F): 97.8 Respiratory Rate 18 (breaths/min): Photos: [N/A:N/A] Wound  Location: Right Foot - Plantar N/A N/A Wounding Event: Gradually Appeared N/A N/A Primary Etiology: Diabetic Wound/Ulcer of the N/A N/A Lower Extremity Comorbid History: Arrhythmia, Hypertension, N/A N/A Type II Diabetes, Osteoarthritis Date Acquired: 02/15/2017 N/A N/A Weeks of Treatment: 1 N/A N/A Wound Status: Open N/A N/A Pending Amputation on Yes N/A N/A Presentation: Measurements L x W x D 0.5x0.3x0.1 N/A N/A (cm) Area (cm) : 0.118 N/A N/A Volume (cm) : 0.012 N/A N/A % Reduction in Area: 24.80% N/A N/A % Reduction in Volume: 90.50% N/A N/A Classification: Grade 2 N/A N/A Exudate Amount: Large N/A N/A Exudate Type: Serous N/A N/A Exudate Color: amber N/A N/A Foul Odor After Cleansing: Yes  N/A N/A Odor Anticipated Due to No N/A N/A Product Use: Wound Margin: Flat and Intact N/A N/A Granulation Amount: Small (1-33%) N/A N/A Granulation Quality: Pink N/A N/A Necrotic Amount: Large (67-100%) N/A N/A [name], [name] ([id number]) Exposed Structures: Fat Layer (Subcutaneous N/A N/A Tissue) Exposed: Yes Fascia: No Tendon: No Muscle: No Joint: No Bone: No Epithelialization: None N/A N/A Periwound Skin Texture: Callus: Yes N/A N/A Excoriation: No Induration: No Crepitus: No Rash: No Scarring: No Periwound Skin Moisture: Maceration: Yes N/A N/A Dry/Scaly: No Periwound Skin Color: Atrophie Blanche: No N/A N/A Cyanosis: No Ecchymosis: No Erythema: No Hemosiderin Staining: No Mottled: No Pallor: No Rubor: No Temperature: No Abnormality N/A N/A Tenderness on Palpation: Yes N/A N/A Wound Preparation: Ulcer Cleansing: N/A N/A Rinsed/Irrigated with Saline Topical Anesthetic Applied: Other: lidocaine 4% Treatment Notes Electronic Signature(s) Signed: 03/17/2017 4:30:35 PM By: Alric Quan Entered By: Alric Quan on 03/16/2017 11:24:56 [name], [name]  ([id number]) -------------------------------------------------------------------------------- Multi-Disciplinary Care Plan Details Patient Name: [name], [name]. Date of Service: 03/16/2017 11:00 AM Medical Record Number: [id number] Patient Account Number: [id number] Date of Birth/Sex: February 12, 1950 (67 y.o. Male) Treating RN: Ahmed Prima Primary Care Kenley Rettinger: TATE, [facility] Other Clinician: Referring Dayna Alia: Benita Stabile Treating Kiyon Fidalgo/Extender: STONE III, HOYT Weeks in Treatment: 1 Active Inactive ` Nutrition Nursing Diagnoses: Imbalanced nutrition Impaired glucose control: actual or potential Potential for alteratiion in Nutrition/Potential for imbalanced nutrition Goals: Patient/caregiver agrees to and verbalizes understanding of need to use nutritional supplements and/or vitamins as prescribed Date Initiated: 03/09/2017 Target Resolution Date: 05/29/2017 Goal Status: Active Patient/caregiver will maintain therapeutic glucose control Date Initiated: 03/09/2017 Target Resolution Date: 05/29/2017 Goal Status: Active Interventions: Assess patient nutrition upon admission and as needed per policy Provide education on elevated blood sugars and impact on wound healing Provide education on nutrition Notes: ` Orientation to the Wound Care Program Nursing Diagnoses: Knowledge deficit related to the wound healing center program Goals: Patient/caregiver will verbalize understanding of the [facility] Date Initiated: 03/09/2017 Target Resolution Date: 03/27/2017 Goal Status: Active Interventions: Provide education on orientation to the wound center Notes: ` Pain, Acute or Chronic Nursing Diagnoses: KIAAN, OVERHOLSER ([id number]) Pain, acute or chronic: actual or potential Potential alteration in comfort, pain Goals: Patient/caregiver will verbalize adequate pain control between visits Date Initiated: 03/09/2017 Target Resolution Date: 05/29/2017 Goal Status:  Active Interventions: Complete pain assessment as per visit requirements Notes: ` Pressure Nursing Diagnoses: Knowledge deficit related to causes and risk factors for pressure ulcer development Knowledge deficit related to management of pressures ulcers Potential for impaired tissue integrity related to pressure, friction, moisture, and shear Goals: Patient will remain free from development of additional pressure ulcers Date Initiated: 03/09/2017 Target Resolution Date: 06/26/2017 Goal Status: Active Interventions: Assess offloading mechanisms upon admission and as needed Notes: ` Wound/Skin Impairment Nursing Diagnoses: Impaired tissue integrity Knowledge deficit related to ulceration/compromised skin integrity Goals: Ulcer/skin breakdown will have a volume reduction of 80% by week 12 Date Initiated: 03/09/2017 Target Resolution Date: 06/19/2017 Goal Status: Active Interventions: Assess ulceration(s) every visit Notes: Electronic Signature(s) Signed: 03/17/2017 4:30:35 PM By: Alric Quan Entered By: Alric Quan on 03/16/2017 11:24:47 [name], [name] ([id number]) -------------------------------------------------------------------------------- Pain Assessment Details Patient Name: [name]. Date of Service: 03/16/2017 11:00 AM Medical Record Number: [id number] Patient Account Number: [id number] Date of Birth/Sex: 10/24/1950 (67 y.o. Male) Treating RN: Montey Hora Primary Care Lucielle Vokes: Benita Stabile Other Clinician: Referring Tiasha Helvie: Benita Stabile Treating Terrick Allred/Extender: STONE III, HOYT Weeks in Treatment: 1 Active Problems Location of Pain Severity  and Description of Pain Patient Has Paino No Site Locations Pain Management and Medication Current Pain Management: Notes Topical or injectable lidocaine is offered to patient for acute pain when surgical debridement is performed. If needed, Patient is instructed to use over the counter pain medication for  the following 24-48 hours after debridement. Wound care MDs do not prescribed pain medications. Patient has chronic pain or uncontrolled pain. Patient has been instructed to make an appointment with their Primary Care Physician for pain management. Electronic Signature(s) Signed: 03/16/2017 4:46:41 PM By: Montey Hora Entered By: Montey Hora on 03/16/2017 11:05:16 [name], [name] ([id number]) -------------------------------------------------------------------------------- Patient/Caregiver Education Details Patient Name: [name] Date of Service: 03/16/2017 11:00 AM Medical Record Number: [id number] Patient Account Number: [id number] Date of Birth/Gender: Nov 12, 1950 (67 y.o. Male) Treating RN: Roger Shelter Primary Care Physician: Benita Stabile Other Clinician: Referring Physician: Benita Stabile Treating Physician/Extender: Sharalyn Ink in Treatment: 1 Education Assessment Education Provided To: Patient Education Topics Provided Wound Debridement: Handouts: Wound Debridement Methods: Explain/Verbal Responses: State content correctly Wound/Skin Impairment: Handouts: Caring for Your Ulcer Methods: Explain/Verbal Responses: State content correctly Electronic Signature(s) Signed: 03/19/2017 5:56:09 PM By: Roger Shelter Entered By: Roger Shelter on 03/16/2017 11:43:20 [name], [name] ([id number]) -------------------------------------------------------------------------------- Wound Assessment Details Patient Name: [name]. Date of Service: 03/16/2017 11:00 AM Medical Record Number: [id number] Patient Account Number: [id number] Date of Birth/Sex: October 16, 1950 (66 y.o. Male) Treating RN: Montey Hora Primary Care Ahonesty Woodfin: Benita Stabile Other Clinician: Referring Vylette Strubel: Benita Stabile Treating Amyra Vantuyl/Extender: STONE III, HOYT Weeks in Treatment: 1 Wound Status Wound Number: 1 Primary Diabetic Wound/Ulcer of the Lower Extremity Etiology: Wound  Location: Right Foot - Plantar Wound Status: Open Wounding Event: Gradually Appeared Comorbid Arrhythmia, Hypertension, Type II Diabetes, Date Acquired: 02/15/2017 History: Osteoarthritis Weeks Of Treatment: 1 Clustered Wound: No Pending Amputation On Presentation Photos Photo Uploaded By: Montey Hora on 03/16/2017 11:15:16 Wound Measurements Length: (cm) 0.5 Width: (cm) 0.3 Depth: (cm) 0.1 Area: (cm) 0.118 Volume: (cm) 0.012 % Reduction in Area: 24.8% % Reduction in Volume: 90.5% Epithelialization: None Tunneling: No Undermining: No Wound Description Classification: Grade 2 Wound Margin: Flat and Intact Exudate Amount: Large Exudate Type: Serous Exudate Color: amber Foul Odor After Cleansing: Yes Due to Product Use: No Slough/Fibrino Yes Wound Bed Granulation Amount: Small (1-33%) Exposed Structure Granulation Quality: Pink Fascia Exposed: No Necrotic Amount: Large (67-100%) Fat Layer (Subcutaneous Tissue) Exposed: Yes Necrotic Quality: Adherent Slough Tendon Exposed: No Muscle Exposed: No Joint Exposed: No Bone Exposed: No Periwound Skin Texture [name], [name] J. ([id number]) Texture Color No Abnormalities Noted: No No Abnormalities Noted: No Callus: Yes Atrophie Blanche: No Crepitus: No Cyanosis: No Excoriation: No Ecchymosis: No Induration: No Erythema: No Rash: No Hemosiderin Staining: No Scarring: No Mottled: No Pallor: No Moisture Rubor: No No Abnormalities Noted: No Dry / Scaly: No Temperature / Pain Maceration: Yes Temperature: No Abnormality Tenderness on Palpation: Yes Wound Preparation Ulcer Cleansing: Rinsed/Irrigated with Saline Topical Anesthetic Applied: Other: lidocaine 4%, Treatment Notes Wound #1 (Right, Plantar Foot) 1. Cleansed with: Clean wound with Normal Saline 2. Anesthetic Topical Lidocaine 4% cream to wound bed prior to debridement 4. Dressing Applied: Other dressing (specify in notes) 5. Secondary Dressing  Applied Dry Gauze Notes silvercell, kerlix wrap, peg assist shoe for offloading Electronic Signature(s) Signed: 03/16/2017 4:46:41 PM By: Montey Hora Entered By: Montey Hora on 03/16/2017 11:09:37 [name], [name] ([id number]) -------------------------------------------------------------------------------- Ecru Details Patient Name: [name]. Date of Service: 03/16/2017 11:00 AM Medical Record Number: [id number] Patient Account  Number: [id number] Date of Birth/Sex: 07-15-1950 (67 y.o. Male) Treating RN: Montey Hora Primary Care Shardae Kleinman: TATE, Sharlet Salina Other Clinician: Referring Phila Shoaf: Benita Stabile Treating Devona Holmes/Extender: STONE III, HOYT Weeks in Treatment: 1 Vital Signs Time Taken: 11:05 Temperature (F): 97.8 Height (in): 73 Pulse (bpm): 90 Weight (lbs): 231 Respiratory Rate (breaths/min): 18 Body Mass Index (BMI): 30.5 Blood Pressure (mmHg): 149/77 Reference Range: 80 - 120 mg / dl Electronic Signature(s) Signed: 03/16/2017 4:46:41 PM By: Montey Hora Entered By: Montey Hora on 03/16/2017 11:06:28

## 2017-03-23 ENCOUNTER — Encounter: Payer: PPO | Attending: Physician Assistant | Admitting: Physician Assistant

## 2017-03-23 DIAGNOSIS — Z79899 Other long term (current) drug therapy: Secondary | ICD-10-CM | POA: Diagnosis not present

## 2017-03-23 DIAGNOSIS — I482 Chronic atrial fibrillation: Secondary | ICD-10-CM | POA: Insufficient documentation

## 2017-03-23 DIAGNOSIS — Z7984 Long term (current) use of oral hypoglycemic drugs: Secondary | ICD-10-CM | POA: Insufficient documentation

## 2017-03-23 DIAGNOSIS — J449 Chronic obstructive pulmonary disease, unspecified: Secondary | ICD-10-CM | POA: Diagnosis not present

## 2017-03-23 DIAGNOSIS — Z8249 Family history of ischemic heart disease and other diseases of the circulatory system: Secondary | ICD-10-CM | POA: Diagnosis not present

## 2017-03-23 DIAGNOSIS — Z87891 Personal history of nicotine dependence: Secondary | ICD-10-CM | POA: Insufficient documentation

## 2017-03-23 DIAGNOSIS — I10 Essential (primary) hypertension: Secondary | ICD-10-CM | POA: Diagnosis not present

## 2017-03-23 DIAGNOSIS — L97512 Non-pressure chronic ulcer of other part of right foot with fat layer exposed: Secondary | ICD-10-CM | POA: Insufficient documentation

## 2017-03-23 DIAGNOSIS — M1990 Unspecified osteoarthritis, unspecified site: Secondary | ICD-10-CM | POA: Insufficient documentation

## 2017-03-23 DIAGNOSIS — E11621 Type 2 diabetes mellitus with foot ulcer: Secondary | ICD-10-CM | POA: Diagnosis not present

## 2017-03-25 ENCOUNTER — Ambulatory Visit: Payer: PPO | Admitting: Podiatry

## 2017-03-25 NOTE — Progress Notes (Signed)
HUNNER, GARCON ([id number]) Visit Report for 03/23/2017 Chief Complaint Document Details Patient Name: [name], [name]. Date of Service: 03/23/2017 9:30 AM Medical Record Number: [id number] Patient Account Number: [id number] Date of Birth/Sex: Jun 25, 1950 (67 y.o. Male) Treating RN: Ahmed Prima Primary Care Provider: Benita Stabile Other Clinician: Referring Provider: Benita Stabile Treating Provider/Extender: Melburn Hake, Zuriel Roskos Weeks in Treatment: 2 Information Obtained from: Patient Chief Complaint Right plantar foot ulcer Electronic Signature(s) Signed: 03/23/2017 7:21:18 PM By: Worthy Keeler PA-C Entered By: Worthy Keeler on 03/23/2017 09:39:17 Barresi, [name] ([id number]) -------------------------------------------------------------------------------- HPI Details Patient Name: [name]. Date of Service: 03/23/2017 9:30 AM Medical Record Number: [id number] Patient Account Number: [id number] Date of Birth/Sex: 11-Jan-1951 (67 y.o. Male) Treating RN: Ahmed Prima Primary Care Provider: Benita Stabile Other Clinician: Referring Provider: Benita Stabile Treating Provider/Extender: STONE III, Jayliana Valencia Weeks in Treatment: 2 History of Present Illness Associated Signs and Symptoms: Patient has a history of diabetes mellitus type II, hypertension, and atrial fibrillation. He also has a hammertoe deformity of the bilateral feet which seems to be causing pressure in the ball of his foot. HPI Description: 03/09/17 on evaluation today patient presents for his initial evaluation concerning an ulcer on the plantar aspect of his right foot which has been open he tells me for about three weeks. He has been seen at [facility] where they did perform debridement it appears according to a note on 02/22/17 where unfortunately it appears that his callous area began to break down into an ulcer after modifications have been added to his insulin. This had happened previously as well. However I do not have  details of the severity of debridement at that point although it does not sound as if you the debridement was too significant based on what the patient is telling me that he still had a lot of callous following debridement. With that being said they were going to look into altering his insoles to try to prevent further breakdown in the future. Nonetheless he has had foul odor discharge coming from the ulcer which is noted all the way back to that visit [facility]. Patient states that this was concerning him more than anything else. He does have diabetes although he described this as "borderline diabetes" he is on metoprolol however along with lisinopril. Patient is not having any issues with pain in regard to his right plantar foot. 03/16/17 on evaluation today patient appears to be doing much better in regard to his plantar foot ulcer. He actually tells me that he loves the peg assist offloading shoe and that he hasn't had any pain in the ulcer area from the callous since I worked on it last week. Overall he is extremely happy with how things have progressed. Likewise the wound bed has no slough noted there is no evidence of infection and it looks excellent. I did receive the results of his hemoglobin A1c which showed a value of 5.9 which was elevated but actually rather well. Subsequently I did also receive the x-ray of his foot which showed diffuse degenerative change but no underlying acute bony abnormality. 03/23/17 on evaluation today patient appears to be doing better in regard to his right plantar foot ulcer. He continues to show signs of improvement there's definitely not as much drainage at this point. He has been tolerating the dressing changes without complication he is not happy with the peg assist offloading shoe but at the same time I do believe that it  is making good progress as far as offloading is concerned. I still believe he may need to talk to a surgeon about  surgically correcting a hammertoe in order to avoid additional pressure to the site especially since he Artie has diabetic shoes and they do not seem to have prevented callous buildup in ulcer formation. Electronic Signature(s) Signed: 03/23/2017 7:21:18 PM By: Worthy Keeler PA-C Entered By: Worthy Keeler on 03/23/2017 09:54:09 Portilla, [name] ([id number]) -------------------------------------------------------------------------------- Physical Exam Details Patient Name: [name], [name]. Date of Service: 03/23/2017 9:30 AM Medical Record Number: [id number] Patient Account Number: [id number] Date of Birth/Sex: 1950-10-08 (67 y.o. Male) Treating RN: Ahmed Prima Primary Care Provider: Benita Stabile Other Clinician: Referring Provider: TATE, Sharlet Salina Treating Provider/Extender: STONE III, Arrielle Mcginn Weeks in Treatment: 2 Constitutional Well-nourished and well-hydrated in no acute distress. Respiratory normal breathing without difficulty. clear to auscultation bilaterally. Cardiovascular regular rate and rhythm with normal S1, S2. Psychiatric this patient is able to make decisions and demonstrates good insight into disease process. Alert and Oriented x 3. pleasant and cooperative. Notes Patient's wound today actually shows a good granular bed that does not appear to be any evidence of infection at this point in time. He does have some callous minimally surrounding the wound opening however this is not really too significant and in fact has done very well with the offloading shoe at this point. No sharp debridement was required today. Electronic Signature(s) Signed: 03/23/2017 7:21:18 PM By: Worthy Keeler PA-C Entered By: Worthy Keeler on 03/23/2017 09:54:47 Brashier, [name] ([id number]) -------------------------------------------------------------------------------- Physician Orders Details Patient Name: EDI, [name]. Date of Service: 03/23/2017 9:30 AM Medical Record Number:  [id number] Patient Account Number: [id number] Date of Birth/Sex: 09/22/1950 (67 y.o. Male) Treating RN: Ahmed Prima Primary Care Provider: TATE, Sharlet Salina Other Clinician: Referring Provider: Benita Stabile Treating Provider/Extender: Melburn Hake, Chozen Latulippe Weeks in Treatment: 2 Verbal / Phone Orders: Yes Clinician: Pinkerton, Debi Read Back and Verified: Yes Diagnosis Coding ICD-10 Coding Code Description E11.621 Type 2 diabetes mellitus with foot ulcer L97.512 Non-pressure chronic ulcer of other part of right foot with fat layer exposed I10 Essential (primary) hypertension I48.2 Chronic atrial fibrillation Wound Cleansing Wound #1 Right,Plantar Foot o Clean wound with Normal Saline. Anesthetic (add to Medication List) Wound #1 Right,Plantar Foot o Topical Lidocaine 4% cream applied to wound bed prior to debridement (In Clinic Only). Skin Barriers/Peri-Wound Care Wound #1 Right,Plantar Foot o Skin Prep Primary Wound Dressing Wound #1 Right,Plantar Foot o Prisma Ag - moisten with saline Secondary Dressing Wound #1 Right,Plantar Foot o Dry Gauze o Conform/Kerlix o Coban o Foam - or felt for offloading Dressing Change Frequency Wound #1 Right,Plantar Foot o Change dressing [name] day. Follow-up Appointments Wound #1 Right,Plantar Foot o Return Appointment in 1 week. Edema Control Wound #1 Right,Plantar Foot Free, Yehuda J. ([id number]) o Elevate legs to the level of the heart and pump ankles as often as possible Off-Loading Wound #1 Right,Plantar Foot o Open toe surgical shoe with peg assist. Additional Orders / Instructions Wound #1 Right,Plantar Foot o Increase protein intake. Consults o General Surgery - Dr. Doran Durand (Ortho) Patient Medications Allergies: penicillin Notifications Medication Indication Start End lidocaine DOSE 1 - topical 4 % cream - 1 cream topical Electronic Signature(s) Signed: 03/23/2017 7:21:18 PM By: Worthy Keeler  PA-C Signed: 03/24/2017 4:15:20 PM By: Alric Quan Entered By: Alric Quan on 03/23/2017 10:04:29 Doeden, [name] ([id number]) -------------------------------------------------------------------------------- Prescription 03/23/2017 Patient Name: [name] Provider: Melburn Hake, Verl Kitson PA-C Date of  Birth: 05-Oct-1950 NPI#: [id number] Sex: Jerilynn Mages DEA#: [id number] Phone #: [phone] License #: Patient Address: [facility] [place], [place] [address] [address] [place], [place] [address] [phone] Allergies penicillin Medication Medication: Route: Strength: Form: lidocaine 4 % topical cream topical 4% cream Class: TOPICAL LOCAL ANESTHETICS Dose: Frequency / Time: Indication: 1 1 cream topical Number of Refills: Number of Units: 0 Generic Substitution: Start Date: End Date: One Time Use: Substitution Permitted No Note to Pharmacy: Signature(s): Date(s): Electronic Signature(s) Signed: 03/23/2017 7:21:18 PM By: Worthy Keeler PA-C Signed: 03/24/2017 4:15:20 PM By: Alric Quan Entered By: Alric Quan on 03/23/2017 10:04:30 Henricks, [name] ([id number]) --------------------------------------------------------------------------------  Problem List Details Patient Name: [name]. Date of Service: 03/23/2017 9:30 AM Medical Record Number: [id number] Patient Account Number: [id number] Date of Birth/Sex: [birth year] (67 y.o. Male) Treating RN: Ahmed Prima Primary Care Provider: Benita Stabile Other Clinician: Referring Provider: Benita Stabile Treating Provider/Extender: Melburn Hake, Channin Agustin Weeks in Treatment: 2 Active Problems ICD-10 Encounter Code Description Active Date Diagnosis E11.621 Type 2 diabetes mellitus with foot ulcer 03/09/2017 Yes L97.512 Non-pressure chronic ulcer of other part of right foot with fat layer 03/09/2017 Yes exposed [place] (primary)  hypertension 03/09/2017 Yes I48.2 Chronic atrial fibrillation 03/09/2017 Yes Inactive Problems Resolved Problems Electronic Signature(s) Signed: 03/23/2017 7:21:18 PM By: Worthy Keeler PA-C Entered By: Worthy Keeler on 03/23/2017 09:39:05 Mistry, [name] ([id number]) -------------------------------------------------------------------------------- Progress Note Details Patient Name: [name]. Date of Service: 03/23/2017 9:30 AM Medical Record Number: [id number] Patient Account Number: [id number] Date of Birth/Sex: 12-Apr-1950 (67 y.o. Male) Treating RN: Ahmed Prima Primary Care Provider: Benita Stabile Other Clinician: Referring Provider: Benita Stabile Treating Provider/Extender: Melburn Hake, Iniko Robles Weeks in Treatment: 2 Subjective Chief Complaint Information obtained from Patient Right plantar foot ulcer History of Present Illness (HPI) The following HPI elements were documented for the patient's wound: Associated Signs and Symptoms: Patient has a history of diabetes mellitus type II, hypertension, and atrial fibrillation. He also has a hammertoe deformity of the bilateral feet which seems to be causing pressure in the ball of his foot. 03/09/17 on evaluation today patient presents for his initial evaluation concerning an ulcer on the plantar aspect of his right foot which has been open he tells me for about three weeks. He has been seen at [facility] where they did perform debridement it appears according to a note on 02/22/17 where unfortunately it appears that his callous area began to break down into an ulcer after modifications have been added to his insulin. This had happened previously as well. However I do not have details of the severity of debridement at that point although it does not sound as if you the debridement was too significant based on what the patient is telling me that he still had a lot of callous following debridement. With that being said they were going to  look into altering his insoles to try to prevent further breakdown in the future. Nonetheless he has had foul odor discharge coming from the ulcer which is noted all the way back to that visit [facility]. Patient states that this was concerning him more than anything else. He does have diabetes although he described this as "borderline diabetes" he is on metoprolol however along with lisinopril. Patient is not having any issues with pain in regard to his right plantar foot. 03/16/17 on evaluation today patient appears to  be doing much better in regard to his plantar foot ulcer. He actually tells me that he loves the peg assist offloading shoe and that he hasn't had any pain in the ulcer area from the callous since I worked on it last week. Overall he is extremely happy with how things have progressed. Likewise the wound bed has no slough noted there is no evidence of infection and it looks excellent. I did receive the results of his hemoglobin A1c which showed a value of 5.9 which was elevated but actually rather well. Subsequently I did also receive the x-ray of his foot which showed diffuse degenerative change but no underlying acute bony abnormality. 03/23/17 on evaluation today patient appears to be doing better in regard to his right plantar foot ulcer. He continues to show signs of improvement there's definitely not as much drainage at this point. He has been tolerating the dressing changes without complication he is not happy with the peg assist offloading shoe but at the same time I do believe that it is making good progress as far as offloading is concerned. I still believe he may need to talk to a surgeon about surgically correcting a hammertoe in order to avoid additional pressure to the site especially since he Artie has diabetic shoes and they do not seem to have prevented callous buildup in ulcer formation. Patient History Information obtained from Patient. Family  History Cancer - Siblings, Diabetes - Father, Heart Disease - Mother, Hypertension - Mother, No family history of Hereditary Spherocytosis, Kidney Disease, Lung Disease, Seizures, Stroke, Thyroid Problems, Tuberculosis. DAMONT, BALLES ([id number]) Social History Former smoker, Marital Status - Single, Alcohol Use - Never, Drug Use - No History, Caffeine Use - Moderate. Review of Systems (ROS) Constitutional Symptoms (General Health) Denies complaints or symptoms of Fever, Chills. Respiratory The patient has no complaints or symptoms. Cardiovascular The patient has no complaints or symptoms. Objective Constitutional Well-nourished and well-hydrated in no acute distress. Vitals Time Taken: 9:39 AM, Height: 73 in, Weight: 231 lbs, BMI: 30.5, Temperature: 97.9 F, Pulse: 81 bpm, Respiratory Rate: 16 breaths/min, Blood Pressure: 141/74 mmHg. Respiratory normal breathing without difficulty. clear to auscultation bilaterally. Cardiovascular regular rate and rhythm with normal S1, S2. Psychiatric this patient is able to make decisions and demonstrates good insight into disease process. Alert and Oriented x 3. pleasant and cooperative. General Notes: Patient's wound today actually shows a good granular bed that does not appear to be any evidence of infection at this point in time. He does have some callous minimally surrounding the wound opening however this is not really too significant and in fact has done very well with the offloading shoe at this point. No sharp debridement was required today. Integumentary (Hair, Skin) Wound #1 status is Open. Original cause of wound was Gradually Appeared. The wound is located on the Roberta. The wound measures 0.4cm length x 0.2cm width x 0.1cm depth; 0.063cm^2 area and 0.006cm^3 volume. There is Fat Layer (Subcutaneous Tissue) Exposed exposed. There is no tunneling or undermining noted. There is a large amount of serous drainage noted.  Foul odor after cleansing was noted. The wound margin is flat and intact. There is large (67-100%) red granulation within the wound bed. There is a small (1-33%) amount of necrotic tissue within the wound bed including Adherent Slough. The periwound skin appearance exhibited: Callus, Maceration. The periwound skin appearance did not exhibit: Crepitus, Excoriation, Induration, Rash, Scarring, Dry/Scaly, Atrophie Blanche, Cyanosis, Ecchymosis, Hemosiderin Staining, Mottled, Pallor, Rubor, Erythema. Periwound  temperature was noted as No Abnormality. The periwound has tenderness on palpation. EUGEAN, ARNOTT ([id number]) Assessment Active Problems ICD-10 E11.621 - Type 2 diabetes mellitus with foot ulcer L97.512 - Non-pressure chronic ulcer of other part of right foot with fat layer exposed I10 - Essential (primary) hypertension I48.2 - Chronic atrial fibrillation Plan Wound Cleansing: Wound #1 Right,Plantar Foot: Clean wound with Normal Saline. Anesthetic (add to Medication List): Wound #1 Right,Plantar Foot: Topical Lidocaine 4% cream applied to wound bed prior to debridement (In Clinic Only). Skin Barriers/Peri-Wound Care: Wound #1 Right,Plantar Foot: Skin Prep Primary Wound Dressing: Wound #1 Right,Plantar Foot: Prisma Ag - moisten with saline Secondary Dressing: Wound #1 Right,Plantar Foot: Dry Gauze Conform/Kerlix Coban Foam - or felt for offloading Dressing Change Frequency: Wound #1 Right,Plantar Foot: Change dressing [name] day. Follow-up Appointments: Wound #1 Right,Plantar Foot: Return Appointment in 1 week. Edema Control: Wound #1 Right,Plantar Foot: Elevate legs to the level of the heart and pump ankles as often as possible Off-Loading: Wound #1 Right,Plantar Foot: Open toe surgical shoe with peg assist. Additional Orders / Instructions: Wound #1 Right,Plantar Foot: Increase protein intake. Consults ordered were: General Surgery - Dr. Doran Durand (Ortho) The  following medication(s) was prescribed: lidocaine topical 4 % cream 1 1 cream topical was prescribed at facility [name], [name] ([id number]) At this point patient appears to be doing excellent in regard to his foot ulceration and I'm definitely seen signs of good improvement week by week. I think the offloading shoe is helping. Nonetheless due to the hammertoe deformity of the right second toe I'm concerned about ongoing pressure to this area. I would like for him to be evaluated by Dr. Doran Durand who is orthopedics specializing in foot and ankle surgery to see if there is anything that would recommend for the patient at this point to help with ongoing offloading to hopefully prevent future ulcerations and hopefully prevent future complications. Patient is in agreement with the plan. We will therefore make that referral for him and see what Dr. Doran Durand has to say in that regard. Otherwise I will see him in one week hopefully we are nearing completion as far as getting this wound to heal. Please see above for specific wound care orders. We will see patient for re-evaluation in 1 week(s) here in the clinic. If anything worsens or changes patient will contact our office for additional recommendations. Electronic Signature(s) Signed: 03/23/2017 7:21:18 PM By: Worthy Keeler PA-C Entered By: Worthy Keeler on 03/23/2017 10:23:02 [name] ([id number]) -------------------------------------------------------------------------------- ROS/PFSH Details Patient Name: [name], [name]. Date of Service: 03/23/2017 9:30 AM Medical Record Number: [id number] Patient Account Number: [id number] Date of Birth/Sex: April 01, 1950 (67 y.o. Male) Treating RN: Ahmed Prima Primary Care Provider: TATE, Sharlet Salina Other Clinician: Referring Provider: Benita Stabile Treating Provider/Extender: STONE III, Dartanyon Frankowski Weeks in Treatment: 2 Information Obtained From Patient Wound History Do you currently have one or more open woundso  Yes How many open wounds do you currently haveo 1 Approximately how long have you had your woundso 3 weeks How have you been treating your wound(s) until nowo mupirocin Has your wound(s) ever healed and then re-openedo No Have you had any lab work done in the past montho No Have you tested positive for an antibiotic resistant organism (MRSA, VRE)o No Have you tested positive for osteomyelitis (bone infection)o No Have you had any tests for circulation on your legso No Constitutional Symptoms (General Health) Complaints and Symptoms: Negative for: Fever; Chills Eyes Medical History: Negative for: Cataracts;  Glaucoma; Optic Neuritis Ear/Nose/Mouth/Throat Medical History: Negative for: Chronic sinus problems/congestion; Middle ear problems Hematologic/Lymphatic Medical History: Negative for: Anemia; Hemophilia; Human Immunodeficiency Virus; Lymphedema; Sickle Cell Disease Respiratory Complaints and Symptoms: No Complaints or Symptoms Medical History: Negative for: Aspiration; Asthma; Chronic Obstructive Pulmonary Disease (COPD); Pneumothorax; Sleep Apnea; Tuberculosis Cardiovascular Complaints and Symptoms: No Complaints or Symptoms Medical History: Positive for: Arrhythmia - a fib; Hypertension Negative for: Angina; Congestive Heart Failure; Coronary Artery Disease; Deep Vein Thrombosis; Hypotension; Myocardial [name], [name] ([id number]) Infarction; Peripheral Arterial Disease; Peripheral Venous Disease; Phlebitis; Vasculitis Gastrointestinal Medical History: Negative for: Cirrhosis ; Colitis; Crohnos; Hepatitis A; Hepatitis B; Hepatitis C Endocrine Medical History: Positive for: Type II Diabetes Treated with: Oral agents Blood sugar tested [name] day: No Immunological Medical History: Negative for: Lupus Erythematosus; Raynaudos; Scleroderma Integumentary (Skin) Medical History: Negative for: History of Burn; History of pressure wounds Musculoskeletal Medical  History: Positive for: Osteoarthritis Negative for: Gout; Rheumatoid Arthritis; Osteomyelitis Neurologic Medical History: Negative for: Dementia; Neuropathy Oncologic Medical History: Negative for: Received Chemotherapy; Received Radiation Immunizations Pneumococcal Vaccine: Received Pneumococcal Vaccination: Yes Immunization Notes: up to date Implantable Devices Family and Social History Cancer: Yes - Siblings; Diabetes: Yes - Father; Heart Disease: Yes - Mother; Hereditary Spherocytosis: No; Hypertension: Yes - Mother; Kidney Disease: No; Lung Disease: No; Seizures: No; Stroke: No; Thyroid Problems: No; Tuberculosis: No; Former smoker; Marital Status - Single; Alcohol Use: Never; Drug Use: No History; Caffeine Use: Moderate; Financial Concerns: No; Food, Clothing or Shelter Needs: No; Support System Lacking: No; Transportation Concerns: No; Advanced Directives: No; Patient does not want information on Advanced Directives Physician Affirmation I have reviewed and agree with the above information. JAKOLBY, SEDIVY ([id number]) Electronic Signature(s) Signed: 03/23/2017 7:21:18 PM By: Worthy Keeler PA-C Signed: 03/24/2017 4:15:20 PM By: Alric Quan Entered By: Worthy Keeler on 03/23/2017 09:54:28 Vanwart, [name] ([id number]) -------------------------------------------------------------------------------- SuperBill Details Patient Name: [name], [name]. Date of Service: 03/23/2017 Medical Record Number: [id number] Patient Account Number: [id number] Date of Birth/Sex: Dec 23, 1950 (67 y.o. Male) Treating RN: Ahmed Prima Primary Care Provider: TATE, Sharlet Salina Other Clinician: Referring Provider: Benita Stabile Treating Provider/Extender: Melburn Hake, Quamaine Webb Weeks in Treatment: 2 Diagnosis Coding ICD-10 Codes Code Description E11.621 Type 2 diabetes mellitus with foot ulcer L97.512 Non-pressure chronic ulcer of other part of right foot with fat layer exposed I10 Essential (primary)  hypertension I48.2 Chronic atrial fibrillation Facility Procedures CPT4 Code: [id number] Description: 99213 - WOUND CARE VISIT-LEV 3 EST PT Modifier: Quantity: 1 Physician Procedures CPT4 Code: [id number] Description: 47096 - WC PHYS LEVEL 3 - EST PT ICD-10 Diagnosis Description E11.621 Type 2 diabetes mellitus with foot ulcer L97.512 Non-pressure chronic ulcer of other part of right foot with f I10 Essential (primary) hypertension I48.2 Chronic atrial  fibrillation Modifier: at layer exposed Quantity: 1 Electronic Signature(s) Signed: 03/23/2017 10:36:53 AM By: Alric Quan Signed: 03/23/2017 7:21:18 PM By: Worthy Keeler PA-C Entered By: Alric Quan on 03/23/2017 10:36:53

## 2017-03-26 NOTE — Progress Notes (Signed)
KOHEN, REITHER ([id number]) Visit Report for 03/23/2017 Arrival Information Details Patient Name: [name], [name]. Date of Service: 03/23/2017 9:30 AM Medical Record Number: [id number] Patient Account Number: [id number] Date of Birth/Sex: Dec 29, 1950 (67 y.o. Male) Treating RN: Montey Hora Primary Care Lesha Jager: Benita Stabile Other Clinician: Referring Loys Shugars: Benita Stabile Treating Kazue Cerro/Extender: Melburn Hake, HOYT Weeks in Treatment: 2 Visit Information History Since Last Visit Added or deleted any medications: No Patient Arrived: Ambulatory Any new allergies or adverse reactions: No Arrival Time: 09:29 Had a fall or experienced change in No Accompanied By: spouse activities of daily living that may affect Transfer Assistance: None risk of falls: Patient Identification Verified: Yes Signs or symptoms of abuse/neglect since last No Secondary Verification Process Completed: Yes visito Patient Has Alerts: Yes Hospitalized since last visit: No Patient Alerts: DMII Has Dressing in Place as Prescribed: Yes Has Footwear/Offloading in Place as Yes Prescribed: Left: Surgical Shoe with Pressure Relief Insole Pain Present Now: No Electronic Signature(s) Signed: 03/24/2017 4:38:42 PM By: Montey Hora Entered By: Montey Hora on 03/23/2017 09:30:04 Timberman, [name] ([id number]) -------------------------------------------------------------------------------- Clinic Level of Care Assessment Details Patient Name: [name]. Date of Service: 03/23/2017 9:30 AM Medical Record Number: [id number] Patient Account Number: [id number] Date of Birth/Sex: 1951/01/06 (67 y.o. Male) Treating RN: Ahmed Prima Primary Care Katlen Seyer: TATE, Sharlet Salina Other Clinician: Referring Nasiah Lehenbauer: Benita Stabile Treating Dontarious Schaum/Extender: Melburn Hake, HOYT Weeks in Treatment: 2 Clinic Level of Care Assessment Items TOOL 4 Quantity Score X - Use when only an EandM is performed on FOLLOW-UP visit 1 0 ASSESSMENTS  - Nursing Assessment / Reassessment X - Reassessment of Co-morbidities (includes updates in patient status) 1 10 X- 1 5 Reassessment of Adherence to Treatment Plan ASSESSMENTS - Wound and Skin Assessment / Reassessment X - Simple Wound Assessment / Reassessment - one wound 1 5 []  - 0 Complex Wound Assessment / Reassessment - multiple wounds []  - 0 Dermatologic / Skin Assessment (not related to wound area) ASSESSMENTS - Focused Assessment []  - Circumferential Edema Measurements - multi extremities 0 []  - 0 Nutritional Assessment / Counseling / Intervention []  - 0 Lower Extremity Assessment (monofilament, tuning fork, pulses) []  - 0 Peripheral Arterial Disease Assessment (using hand held doppler) ASSESSMENTS - Ostomy and/or Continence Assessment and Care []  - Incontinence Assessment and Management 0 []  - 0 Ostomy Care Assessment and Management (repouching, etc.) PROCESS - Coordination of Care X - Simple Patient / Family Education for ongoing care 1 15 []  - 0 Complex (extensive) Patient / Family Education for ongoing care []  - 0 Staff obtains Programmer, systems, Records, Test Results / Process Orders []  - 0 Staff telephones HHA, Nursing Homes / Clarify orders / etc []  - 0 Routine Transfer to another Facility (non-emergent condition) []  - 0 Routine Hospital Admission (non-emergent condition) []  - 0 New Admissions / Biomedical engineer / Ordering NPWT, Apligraf, etc. []  - 0 Emergency Hospital Admission (emergent condition) X- 1 10 Simple Discharge Coordination LOVIS, MORE. ([id number]) []  - 0 Complex (extensive) Discharge Coordination PROCESS - Special Needs []  - Pediatric / Minor Patient Management 0 []  - 0 Isolation Patient Management []  - 0 Hearing / Language / Visual special needs []  - 0 Assessment of Community assistance (transportation, D/C planning, etc.) []  - 0 Additional assistance / Altered mentation []  - 0 Support Surface(s) Assessment (bed, cushion, seat,  etc.) INTERVENTIONS - Wound Cleansing / Measurement X - Simple Wound Cleansing - one wound 1 5 []  - 0 Complex Wound Cleansing - multiple wounds X- 1  5 Wound Imaging (photographs - any number of wounds) []  - 0 Wound Tracing (instead of photographs) X- 1 5 Simple Wound Measurement - one wound []  - 0 Complex Wound Measurement - multiple wounds INTERVENTIONS - Wound Dressings X - Small Wound Dressing one or multiple wounds 1 10 []  - 0 Medium Wound Dressing one or multiple wounds []  - 0 Large Wound Dressing one or multiple wounds X- 1 5 Application of Medications - topical []  - 0 Application of Medications - injection INTERVENTIONS - Miscellaneous []  - External ear exam 0 []  - 0 Specimen Collection (cultures, biopsies, blood, body fluids, etc.) []  - 0 Specimen(s) / Culture(s) sent or taken to Lab for analysis []  - 0 Patient Transfer (multiple staff / Civil Service fast streamer / Similar devices) []  - 0 Simple Staple / Suture removal (25 or less) []  - 0 Complex Staple / Suture removal (26 or more) []  - 0 Hypo / Hyperglycemic Management (close monitor of Blood Glucose) []  - 0 Ankle / Brachial Index (ABI) - do not check if billed separately X- 1 5 Vital Signs Godek, Robertson J. ([id number]) Has the patient been seen at the hospital within the last three years: Yes Total Score: 80 Level Of Care: New/Established - Level 3 Electronic Signature(s) Signed: 03/24/2017 4:15:20 PM By: Alric Quan Entered By: Alric Quan on 03/23/2017 10:36:45 Hoare, [name] ([id number]) -------------------------------------------------------------------------------- Encounter Discharge Information Details Patient Name: [name]. Date of Service: 03/23/2017 9:30 AM Medical Record Number: [id number] Patient Account Number: [id number] Date of Birth/Sex: [birth year] (67 y.o. Male) Treating RN: Roger Shelter Primary Care Cleone Hulick: Benita Stabile Other Clinician: Referring Shay Jhaveri: Benita Stabile Treating  Kadence Mikkelson/Extender: Melburn Hake, HOYT Weeks in Treatment: 2 Encounter Discharge Information Items Discharge Pain Level: 0 Discharge Condition: Stable Ambulatory Status: Ambulatory Discharge Destination: Home Transportation: Private Auto Accompanied By: girlfriend Schedule Follow-up Appointment: Yes Medication Reconciliation completed and No provided to Patient/Care Creedence Heiss: Provided on Clinical Summary of Care: 03/23/2017 Form Type Recipient Paper Patient WT Electronic Signature(s) Signed: 03/25/2017 11:42:25 AM By: Ruthine Dose Entered By: Ruthine Dose on 03/23/2017 10:08:27 Pequignot, [name] ([id number]) -------------------------------------------------------------------------------- Lower Extremity Assessment Details Patient Name: [name]. Date of Service: 03/23/2017 9:30 AM Medical Record Number: [id number] Patient Account Number: [id number] Date of Birth/Sex: 1950/11/15 (67 y.o. Male) Treating RN: Montey Hora Primary Care Brien Lowe: Benita Stabile Other Clinician: Referring Willodene Stallings: Benita Stabile Treating Marvelle Caudill/Extender: STONE III, HOYT Weeks in Treatment: 2 Vascular Assessment Pulses: Dorsalis Pedis Palpable: [Right:Yes] Posterior Tibial Extremity colors, hair growth, and conditions: Extremity Color: [Right:Normal] Hair Growth on Extremity: [Right:Yes] Temperature of Extremity: [Right:Warm] Capillary Refill: [Right:< 3 seconds] Toe Nail Assessment Left: Right: Thick: Yes Discolored: Yes Deformed: Yes Improper Length and Hygiene: No Electronic Signature(s) Signed: 03/24/2017 4:38:42 PM By: Montey Hora Entered By: Montey Hora on 03/23/2017 09:35:30 Boehler, [name] ([id number]) -------------------------------------------------------------------------------- Multi Wound Chart Details Patient Name: [name]. Date of Service: 03/23/2017 9:30 AM Medical Record Number: [id number] Patient Account Number: [id number] Date of Birth/Sex: [birth year] (67 y.o.  Male) Treating RN: Ahmed Prima Primary Care Twyla Dais: TATE, Sharlet Salina Other Clinician: Referring Lenia Housley: Benita Stabile Treating Annisha Baar/Extender: STONE III, HOYT Weeks in Treatment: 2 Vital Signs Height(in): 73 Pulse(bpm): 81 Weight(lbs): 231 Blood Pressure(mmHg): 141/74 Body Mass Index(BMI): 30 Temperature(F): 97.9 Respiratory Rate 16 (breaths/min): Photos: [N/A:N/A] Wound Location: Right Foot - Plantar N/A N/A Wounding Event: Gradually Appeared N/A N/A Primary Etiology: Diabetic Wound/Ulcer of the N/A N/A Lower Extremity Comorbid History: Arrhythmia, Hypertension, N/A N/A Type II Diabetes, Osteoarthritis Date Acquired: 02/15/2017 N/A N/A  Weeks of Treatment: 2 N/A N/A Wound Status: Open N/A N/A Pending Amputation on Yes N/A N/A Presentation: Measurements L x W x D 0.4x0.2x0.1 N/A N/A (cm) Area (cm) : 0.063 N/A N/A Volume (cm) : 0.006 N/A N/A % Reduction in Area: 59.90% N/A N/A % Reduction in Volume: 95.20% N/A N/A Classification: Grade 2 N/A N/A Exudate Amount: Large N/A N/A Exudate Type: Serous N/A N/A Exudate Color: amber N/A N/A Foul Odor After Cleansing: Yes N/A N/A Odor Anticipated Due to No N/A N/A Product Use: Wound Margin: Flat and Intact N/A N/A Granulation Amount: Large (67-100%) N/A N/A Granulation Quality: Red N/A N/A Necrotic Amount: Small (1-33%) N/A N/A Finchum, Jerran J. ([id number]) Exposed Structures: Fat Layer (Subcutaneous N/A N/A Tissue) Exposed: Yes Fascia: No Tendon: No Muscle: No Joint: No Bone: No Epithelialization: None N/A N/A Periwound Skin Texture: Callus: Yes N/A N/A Excoriation: No Induration: No Crepitus: No Rash: No Scarring: No Periwound Skin Moisture: Maceration: Yes N/A N/A Dry/Scaly: No Periwound Skin Color: Atrophie Blanche: No N/A N/A Cyanosis: No Ecchymosis: No Erythema: No Hemosiderin Staining: No Mottled: No Pallor: No Rubor: No Temperature: No Abnormality N/A N/A Tenderness on Palpation: Yes N/A  N/A Wound Preparation: Ulcer Cleansing: N/A N/A Rinsed/Irrigated with Saline Topical Anesthetic Applied: Other: lidocaine 4% Treatment Notes Electronic Signature(s) Signed: 03/24/2017 4:15:20 PM By: Alric Quan Entered By: Alric Quan on 03/23/2017 09:41:32 Cowley, [name] ([id number]) -------------------------------------------------------------------------------- Multi-Disciplinary Care Plan Details Patient Name: CLIF, SERIO. Date of Service: 03/23/2017 9:30 AM Medical Record Number: [id number] Patient Account Number: [id number] Date of Birth/Sex: Jun 12, 1950 (67 y.o. Male) Treating RN: Ahmed Prima Primary Care Somaly Marteney: TATE, Sharlet Salina Other Clinician: Referring Ramone Gander: Benita Stabile Treating Brittanni Cariker/Extender: STONE III, HOYT Weeks in Treatment: 2 Active Inactive ` Nutrition Nursing Diagnoses: Imbalanced nutrition Impaired glucose control: actual or potential Potential for alteratiion in Nutrition/Potential for imbalanced nutrition Goals: Patient/caregiver agrees to and verbalizes understanding of need to use nutritional supplements and/or vitamins as prescribed Date Initiated: 03/09/2017 Target Resolution Date: 05/29/2017 Goal Status: Active Patient/caregiver will maintain therapeutic glucose control Date Initiated: 03/09/2017 Target Resolution Date: 05/29/2017 Goal Status: Active Interventions: Assess patient nutrition upon admission and as needed per policy Provide education on elevated blood sugars and impact on wound healing Provide education on nutrition Notes: ` Orientation to the Wound Care Program Nursing Diagnoses: Knowledge deficit related to the wound healing center program Goals: Patient/caregiver will verbalize understanding of the Central Date Initiated: 03/09/2017 Target Resolution Date: 03/27/2017 Goal Status: Active Interventions: Provide education on orientation to the wound center Notes: ` Pain, Acute or  Chronic Nursing Diagnoses: ANTHON, HARPOLE ([id number]) Pain, acute or chronic: actual or potential Potential alteration in comfort, pain Goals: Patient/caregiver will verbalize adequate pain control between visits Date Initiated: 03/09/2017 Target Resolution Date: 05/29/2017 Goal Status: Active Interventions: Complete pain assessment as per visit requirements Notes: ` Pressure Nursing Diagnoses: Knowledge deficit related to causes and risk factors for pressure ulcer development Knowledge deficit related to management of pressures ulcers Potential for impaired tissue integrity related to pressure, friction, moisture, and shear Goals: Patient will remain free from development of additional pressure ulcers Date Initiated: 03/09/2017 Target Resolution Date: 06/26/2017 Goal Status: Active Interventions: Assess offloading mechanisms upon admission and as needed Notes: ` Wound/Skin Impairment Nursing Diagnoses: Impaired tissue integrity Knowledge deficit related to ulceration/compromised skin integrity Goals: Ulcer/skin breakdown will have a volume reduction of 80% by week 12 Date Initiated: 03/09/2017 Target Resolution Date: 06/19/2017 Goal Status: Active Interventions: Assess ulceration(s) every visit Notes: Electronic  Signature(s) Signed: 03/24/2017 4:15:20 PM By: Alric Quan Entered By: Alric Quan on 03/23/2017 09:41:24 Kooi, [name] ([id number]) -------------------------------------------------------------------------------- Pain Assessment Details Patient Name: FINNIAN, HUSTED. Date of Service: 03/23/2017 9:30 AM Medical Record Number: [id number] Patient Account Number: [id number] Date of Birth/Sex: 26-Oct-1950 (67 y.o. Male) Treating RN: Montey Hora Primary Care Kourtnei Rauber: Benita Stabile Other Clinician: Referring Tamico Mundo: Benita Stabile Treating Leslie Langille/Extender: Melburn Hake, HOYT Weeks in Treatment: 2 Active Problems Location of Pain Severity and Description of  Pain Patient Has Paino No Site Locations Pain Management and Medication Current Pain Management: Notes Topical or injectable lidocaine is offered to patient for acute pain when surgical debridement is performed. If needed, Patient is instructed to use over the counter pain medication for the following 24-48 hours after debridement. Wound care MDs do not prescribed pain medications. Patient has chronic pain or uncontrolled pain. Patient has been instructed to make an appointment with their Primary Care Physician for pain management. Electronic Signature(s) Signed: 03/24/2017 4:38:42 PM By: Montey Hora Entered By: Montey Hora on 03/23/2017 09:30:20 Peeters, [name] ([id number]) -------------------------------------------------------------------------------- Patient/Caregiver Education Details Patient Name: JOWELL, BOSSI Date of Service: 03/23/2017 9:30 AM Medical Record Number: [id number] Patient Account Number: [id number] Date of Birth/Gender: 18-Dec-1950 (67 y.o. Male) Treating RN: Roger Shelter Primary Care Physician: Benita Stabile Other Clinician: Referring Physician: Benita Stabile Treating Physician/Extender: Sharalyn Ink in Treatment: 2 Education Assessment Education Provided To: Patient Education Topics Provided Wound Debridement: Handouts: Wound Debridement Methods: Explain/Verbal Responses: State content correctly Wound/Skin Impairment: Handouts: Caring for Your Ulcer Methods: Explain/Verbal Responses: State content correctly Electronic Signature(s) Signed: 03/24/2017 4:15:18 PM By: Roger Shelter Entered By: Roger Shelter on 03/23/2017 10:08:35 Maland, [name] ([id number]) -------------------------------------------------------------------------------- Wound Assessment Details Patient Name: [name]. Date of Service: 03/23/2017 9:30 AM Medical Record Number: [id number] Patient Account Number: [id number] Date of Birth/Sex: September 18, 1950 (67 y.o.  Male) Treating RN: Montey Hora Primary Care Jovontae Banko: Benita Stabile Other Clinician: Referring Thorin Starner: Benita Stabile Treating Jameire Kouba/Extender: STONE III, HOYT Weeks in Treatment: 2 Wound Status Wound Number: 1 Primary Diabetic Wound/Ulcer of the Lower Extremity Etiology: Wound Location: Right Foot - Plantar Wound Status: Open Wounding Event: Gradually Appeared Comorbid Arrhythmia, Hypertension, Type II Diabetes, Date Acquired: 02/15/2017 History: Osteoarthritis Weeks Of Treatment: 2 Clustered Wound: No Pending Amputation On Presentation Photos Photo Uploaded By: Montey Hora on 03/23/2017 09:38:15 Wound Measurements Length: (cm) 0.4 Width: (cm) 0.2 Depth: (cm) 0.1 Area: (cm) 0.063 Volume: (cm) 0.006 % Reduction in Area: 59.9% % Reduction in Volume: 95.2% Epithelialization: None Tunneling: No Undermining: No Wound Description Classification: Grade 2 Wound Margin: Flat and Intact Exudate Amount: Large Exudate Type: Serous Exudate Color: amber Foul Odor After Cleansing: Yes Due to Product Use: No Slough/Fibrino Yes Wound Bed Granulation Amount: Large (67-100%) Exposed Structure Granulation Quality: Red Fascia Exposed: No Necrotic Amount: Small (1-33%) Fat Layer (Subcutaneous Tissue) Exposed: Yes Necrotic Quality: Adherent Slough Tendon Exposed: No Muscle Exposed: No Joint Exposed: No Bone Exposed: No Periwound Skin Texture Brinley, Buzz J. ([id number]) Texture Color No Abnormalities Noted: No No Abnormalities Noted: No Callus: Yes Atrophie Blanche: No Crepitus: No Cyanosis: No Excoriation: No Ecchymosis: No Induration: No Erythema: No Rash: No Hemosiderin Staining: No Scarring: No Mottled: No Pallor: No Moisture Rubor: No No Abnormalities Noted: No Dry / Scaly: No Temperature / Pain Maceration: Yes Temperature: No Abnormality Tenderness on Palpation: Yes Wound Preparation Ulcer Cleansing: Rinsed/Irrigated with Saline Topical Anesthetic  Applied: Other: lidocaine 4%, Treatment Notes Wound #1 (Right, Plantar Foot) 1. Cleansed with: Clean  wound with Normal Saline 2. Anesthetic Topical Lidocaine 4% cream to wound bed prior to debridement 4. Dressing Applied: Dry Gauze Foam Prisma Ag Notes kerlix wrap, peg assist shoe for offloading Electronic Signature(s) Signed: 03/24/2017 4:38:42 PM By: Montey Hora Entered By: Montey Hora on 03/23/2017 09:35:12 Haydu, [name] ([id number]) -------------------------------------------------------------------------------- Vitals Details Patient Name: [name]. Date of Service: 03/23/2017 9:30 AM Medical Record Number: [id number] Patient Account Number: [id number] Date of Birth/Sex: 03-01-1950 (67 y.o. Male) Treating RN: Montey Hora Primary Care Piya Mesch: Benita Stabile Other Clinician: Referring Lamon Rotundo: Benita Stabile Treating Ardythe Klute/Extender: STONE III, HOYT Weeks in Treatment: 2 Vital Signs Time Taken: 09:39 Temperature (F): 97.9 Height (in): 73 Pulse (bpm): 81 Weight (lbs): 231 Respiratory Rate (breaths/min): 16 Body Mass Index (BMI): 30.5 Blood Pressure (mmHg): 141/74 Reference Range: 80 - 120 mg / dl Electronic Signature(s) Signed: 03/24/2017 4:38:42 PM By: Montey Hora Entered By: Montey Hora on 03/23/2017 09:33:43

## 2017-03-30 ENCOUNTER — Encounter: Payer: PPO | Admitting: Physician Assistant

## 2017-03-30 DIAGNOSIS — L97512 Non-pressure chronic ulcer of other part of right foot with fat layer exposed: Secondary | ICD-10-CM | POA: Diagnosis not present

## 2017-03-30 DIAGNOSIS — E11621 Type 2 diabetes mellitus with foot ulcer: Secondary | ICD-10-CM | POA: Diagnosis not present

## 2017-04-01 NOTE — Progress Notes (Signed)
[name], [name] ([id number]) Visit Report for 03/30/2017 Arrival Information Details Patient Name: [name], [name]. Date of Service: 03/30/2017 10:00 AM Medical Record Number: [id number] Patient Account Number: [id number] Date of Birth/Sex: 1950-10-04 (67 y.o. Male) Treating RN: Montey Hora Primary Care Cornelius Marullo: Benita Stabile Other Clinician: Referring Maryruth Apple: Benita Stabile Treating Jalyn Dutta/Extender: Melburn Hake, HOYT Weeks in Treatment: 3 Visit Information History Since Last Visit Added or deleted any medications: No Patient Arrived: Ambulatory Any new allergies or adverse reactions: No Arrival Time: 09:58 Had a fall or experienced change in No Accompanied By: spouse activities of daily living that may affect Transfer Assistance: None risk of falls: Patient Identification Verified: Yes Signs or symptoms of abuse/neglect since last visito No Secondary Verification Process Completed: Yes Hospitalized since last visit: No Patient Has Alerts: Yes Has Dressing in Place as Prescribed: Yes Patient Alerts: DMII Pain Present Now: No Electronic Signature(s) Signed: 03/30/2017 4:54:11 PM By: Montey Hora Entered By: Montey Hora on 03/30/2017 09:58:53 [name], [name] ([id number]) -------------------------------------------------------------------------------- Encounter Discharge Information Details Patient Name: [name]. Date of Service: 03/30/2017 10:00 AM Medical Record Number: [id number] Patient Account Number: [id number] Date of Birth/Sex: 25-Jan-1950 (67 y.o. Male) Treating RN: Ahmed Prima Primary Care Marsheila Alejo: Benita Stabile Other Clinician: Referring Sherylann Vangorden: Benita Stabile Treating Zelma Snead/Extender: Melburn Hake, HOYT Weeks in Treatment: 3 Encounter Discharge Information Items Discharge Pain Level: 0 Discharge Condition: Stable Ambulatory Status: Ambulatory Discharge Destination: Home Transportation: Private Auto Accompanied By: girlfriend Schedule Follow-up  Appointment: Yes Medication Reconciliation completed and No provided to Patient/Care Andretta Ergle: Provided on Clinical Summary of Care: 03/30/2017 Form Type Recipient Paper Patient WT Electronic Signature(s) Signed: 03/31/2017 8:38:15 AM By: Alric Quan Entered By: Alric Quan on 03/30/2017 11:05:35 [name], [name] ([id number]) -------------------------------------------------------------------------------- Lower Extremity Assessment Details Patient Name: [name]. Date of Service: 03/30/2017 10:00 AM Medical Record Number: [id number] Patient Account Number: [id number] Date of Birth/Sex: Feb 28, 1950 (67 y.o. Male) Treating RN: Montey Hora Primary Care Marco Raper: Benita Stabile Other Clinician: Referring Chalsey Leeth: Benita Stabile Treating Kiowa Hollar/Extender: STONE III, HOYT Weeks in Treatment: 3 Vascular Assessment Pulses: Dorsalis Pedis Palpable: [Right:Yes] Posterior Tibial Extremity colors, hair growth, and conditions: Extremity Color: [Right:Hyperpigmented] Hair Growth on Extremity: [Right:Yes] Temperature of Extremity: [Right:Warm] Capillary Refill: [Right:< 3 seconds] Electronic Signature(s) Signed: 03/30/2017 4:54:11 PM By: Montey Hora Entered By: Montey Hora on 03/30/2017 10:03:56 [name], [name] ([id number]) -------------------------------------------------------------------------------- Multi Wound Chart Details Patient Name: [name]. Date of Service: 03/30/2017 10:00 AM Medical Record Number: [id number] Patient Account Number: [id number] Date of Birth/Sex: 05/27/1950 (67 y.o. Male) Treating RN: Ahmed Prima Primary Care Mahesh Sizemore: TATE, Sharlet Salina Other Clinician: Referring Laloni Rowton: Benita Stabile Treating Ivyana Locey/Extender: STONE III, HOYT Weeks in Treatment: 3 Vital Signs Height(in): 73 Pulse(bpm): 22 Weight(lbs): 231 Blood Pressure(mmHg): 151/73 Body Mass Index(BMI): 30 Temperature(F): 98.1 Respiratory Rate 16 (breaths/min): Photos:  [1:No Photos] [N/A:N/A] Wound Location: [1:Right Foot - Plantar] [N/A:N/A] Wounding Event: [1:Gradually Appeared] [N/A:N/A] Primary Etiology: [1:Diabetic Wound/Ulcer of the Lower Extremity] [N/A:N/A] Comorbid History: [1:Arrhythmia, Hypertension, Type II Diabetes, Osteoarthritis] [N/A:N/A] Date Acquired: [1:02/15/2017] [N/A:N/A] Weeks of Treatment: [1:3] [N/A:N/A] Wound Status: [1:Open] [N/A:N/A] Pending Amputation on [1:Yes] [N/A:N/A] Presentation: Measurements L x W x D [1:0.4x0.2x0.3] [N/A:N/A] (cm) Area (cm) : [1:0.063] [N/A:N/A] Volume (cm) : [1:0.019] [N/A:N/A] % Reduction in Area: [1:59.90%] [N/A:N/A] % Reduction in Volume: [1:84.90%] [N/A:N/A] Classification: [1:Grade 2] [N/A:N/A] Exudate Amount: [1:Large] [N/A:N/A] Exudate Type: [1:Serous] [N/A:N/A] Exudate Color: [1:amber] [N/A:N/A] Foul Odor After Cleansing: [1:Yes] [N/A:N/A] Odor Anticipated Due to [1:No] [N/A:N/A] Product Use: Wound Margin: [1:Flat and Intact] [N/A:N/A]  Granulation Amount: [1:Large (67-100%)] [N/A:N/A] Granulation Quality: [1:Red] [N/A:N/A] Necrotic Amount: [1:Small (1-33%)] [N/A:N/A] Exposed Structures: [1:Fat Layer (Subcutaneous Tissue) Exposed: Yes Fascia: No Tendon: No Muscle: No Joint: No Bone: No] [N/A:N/A] Epithelialization: None N/A N/A Periwound Skin Texture: Callus: Yes N/A N/A Excoriation: No Induration: No Crepitus: No Rash: No Scarring: No Periwound Skin Moisture: Maceration: Yes N/A N/A Dry/Scaly: No Periwound Skin Color: Atrophie Blanche: No N/A N/A Cyanosis: No Ecchymosis: No Erythema: No Hemosiderin Staining: No Mottled: No Pallor: No Rubor: No Temperature: No Abnormality N/A N/A Tenderness on Palpation: Yes N/A N/A Wound Preparation: Ulcer Cleansing: N/A N/A Rinsed/Irrigated with Saline Topical Anesthetic Applied: Other: lidocaine 4% Treatment Notes Electronic Signature(s) Signed: 03/31/2017 8:38:15 AM By: Alric Quan Entered By: Alric Quan on  03/30/2017 10:42:06 [name], [name] ([id number]) -------------------------------------------------------------------------------- [place] Details Patient Name: [name], DEGRACE. Date of Service: 03/30/2017 10:00 AM Medical Record Number: [id number] Patient Account Number: [id number] Date of Birth/Sex: 03-30-1950 (67 y.o. Male) Treating RN: Carolyne Fiscal, Debi Primary Care Sadia Belfiore: TATE, Sharlet Salina Other Clinician: Referring Bubba Vanbenschoten: Benita Stabile Treating Inola Lisle/Extender: STONE III, HOYT Weeks in Treatment: 3 Active Inactive ` Nutrition Nursing Diagnoses: Imbalanced nutrition Impaired glucose control: actual or potential Potential for alteratiion in Nutrition/Potential for imbalanced nutrition Goals: Patient/caregiver agrees to and verbalizes understanding of need to use nutritional supplements and/or vitamins as prescribed Date Initiated: 03/09/2017 Target Resolution Date: 05/29/2017 Goal Status: Active Patient/caregiver will maintain therapeutic glucose control Date Initiated: 03/09/2017 Target Resolution Date: 05/29/2017 Goal Status: Active Interventions: Assess patient nutrition upon admission and as needed per policy Provide education on elevated blood sugars and impact on wound healing Provide education on nutrition Notes: ` Orientation to the Wound Care Program Nursing Diagnoses: Knowledge deficit related to the wound healing center program Goals: Patient/caregiver will verbalize understanding of the [facility] Date Initiated: 03/09/2017 Target Resolution Date: 03/27/2017 Goal Status: Active Interventions: Provide education on orientation to the wound center Notes: ` Pain, Acute or Chronic Nursing Diagnoses: ALFONZA, TOFT ([id number]) Pain, acute or chronic: actual or potential Potential alteration in comfort, pain Goals: Patient/caregiver will verbalize adequate pain control between visits Date Initiated: 03/09/2017 Target  Resolution Date: 05/29/2017 Goal Status: Active Interventions: Complete pain assessment as per visit requirements Notes: ` Pressure Nursing Diagnoses: Knowledge deficit related to causes and risk factors for pressure ulcer development Knowledge deficit related to management of pressures ulcers Potential for impaired tissue integrity related to pressure, friction, moisture, and shear Goals: Patient will remain free from development of additional pressure ulcers Date Initiated: 03/09/2017 Target Resolution Date: 06/26/2017 Goal Status: Active Interventions: Assess offloading mechanisms upon admission and as needed Notes: ` Wound/Skin Impairment Nursing Diagnoses: Impaired tissue integrity Knowledge deficit related to ulceration/compromised skin integrity Goals: Ulcer/skin breakdown will have a volume reduction of 80% by week 12 Date Initiated: 03/09/2017 Target Resolution Date: 06/19/2017 Goal Status: Active Interventions: Assess ulceration(s) every visit Notes: Electronic Signature(s) Signed: 03/31/2017 8:38:15 AM By: Alric Quan Entered By: Alric Quan on 03/30/2017 10:41:52 [name], [name] ([id number]) -------------------------------------------------------------------------------- Pain Assessment Details Patient Name: [name]. Date of Service: 03/30/2017 10:00 AM Medical Record Number: [id number] Patient Account Number: [id number] Date of Birth/Sex: 1950/04/12 (67 y.o. Male) Treating RN: Montey Hora Primary Care Mike Berntsen: Benita Stabile Other Clinician: Referring Delron Comer: Benita Stabile Treating Lochlyn Zullo/Extender: STONE III, HOYT Weeks in Treatment: 3 Active Problems Location of Pain Severity and Description of Pain Patient Has Paino No Site Locations Pain Management and Medication Current Pain Management: Notes Topical or injectable lidocaine is offered to patient  for acute pain when surgical debridement is performed. If needed, Patient is instructed to  use over the counter pain medication for the following 24-48 hours after debridement. Wound care MDs do not prescribed pain medications. Patient has chronic pain or uncontrolled pain. Patient has been instructed to make an appointment with their Primary Care Physician for pain management Electronic Signature(s) Signed: 03/30/2017 4:54:11 PM By: Montey Hora Entered By: Montey Hora on 03/30/2017 09:59:10 [name], [name] ([id number]) -------------------------------------------------------------------------------- Patient/Caregiver Education Details Patient Name: [name] Date of Service: 03/30/2017 10:00 AM Medical Record Number: [id number] Patient Account Number: [id number] Date of Birth/Gender: October 03, 1950 (67 y.o. Male) Treating RN: Ahmed Prima Primary Care Physician: Benita Stabile Other Clinician: Referring Physician: Benita Stabile Treating Physician/Extender: Worthy Keeler Weeks in Treatment: 3 Education Assessment Education Provided To: Patient Education Topics Provided Wound/Skin Impairment: Handouts: Caring for Your Ulcer, Other: change dressing as ordered Methods: Demonstration, Explain/Verbal Responses: State content correctly Electronic Signature(s) Signed: 03/31/2017 8:38:15 AM By: Alric Quan Entered By: Alric Quan on 03/30/2017 11:05:51 [name], [name] ([id number]) -------------------------------------------------------------------------------- Wound Assessment Details Patient Name: [name]. Date of Service: 03/30/2017 10:00 AM Medical Record Number: [id number] Patient Account Number: [id number] Date of Birth/Sex: [birth year] (67 y.o. Male) Treating RN: Montey Hora Primary Care Duante Arocho: Benita Stabile Other Clinician: Referring Akirah Storck: Benita Stabile Treating Kahmari Herard/Extender: STONE III, HOYT Weeks in Treatment: 3 Wound Status Wound Number: 1 Primary Diabetic Wound/Ulcer of the Lower Extremity Etiology: Wound Location: Right Foot -  Plantar Wound Status: Open Wounding Event: Gradually Appeared Comorbid Arrhythmia, Hypertension, Type II Diabetes, Date Acquired: 02/15/2017 History: Osteoarthritis Weeks Of Treatment: 3 Clustered Wound: No Pending Amputation On Presentation Photos Photo Uploaded By: Montey Hora on 03/30/2017 12:31:19 Wound Measurements Length: (cm) 0.4 Width: (cm) 0.2 Depth: (cm) 0.3 Area: (cm) 0.063 Volume: (cm) 0.019 % Reduction in Area: 59.9% % Reduction in Volume: 84.9% Epithelialization: None Tunneling: No Undermining: No Wound Description Classification: Grade 2 Wound Margin: Flat and Intact Exudate Amount: Large Exudate Type: Serous Exudate Color: amber Foul Odor After Cleansing: Yes Due to Product Use: No Slough/Fibrino Yes Wound Bed Granulation Amount: Large (67-100%) Exposed Structure Granulation Quality: Red Fascia Exposed: No Necrotic Amount: Small (1-33%) Fat Layer (Subcutaneous Tissue) Exposed: Yes Necrotic Quality: Adherent Slough Tendon Exposed: No Muscle Exposed: No Joint Exposed: No Bone Exposed: No Periwound Skin Texture Scheid, Wissam J. ([id number]) Texture Color No Abnormalities Noted: No No Abnormalities Noted: No Callus: Yes Atrophie Blanche: No Crepitus: No Cyanosis: No Excoriation: No Ecchymosis: No Induration: No Erythema: No Rash: No Hemosiderin Staining: No Scarring: No Mottled: No Pallor: No Moisture Rubor: No No Abnormalities Noted: No Dry / Scaly: No Temperature / Pain Maceration: Yes Temperature: No Abnormality Tenderness on Palpation: Yes Wound Preparation Ulcer Cleansing: Rinsed/Irrigated with Saline Topical Anesthetic Applied: Other: lidocaine 4%, Treatment Notes Wound #1 (Right, Plantar Foot) 1. Cleansed with: Clean wound with Normal Saline 2. Anesthetic Topical Lidocaine 4% cream to wound bed prior to debridement 4. Dressing Applied: Prisma Ag 5. Secondary Dressing Applied Kerlix/Conform 7. Secured  with Tape Notes kerlix wrap, peg assist shoe for offloading, coban, drawtex Electronic Signature(s) Signed: 03/30/2017 4:54:11 PM By: Montey Hora Entered By: Montey Hora on 03/30/2017 10:06:47 [place], [name] ([id number]) -------------------------------------------------------------------------------- Vitals Details Patient Name: [name]. Date of Service: 03/30/2017 10:00 AM Medical Record Number: [id number] Patient Account Number: [id number] Date of Birth/Sex: [birth year] (67 y.o. Male) Treating RN: Montey Hora Primary Care Alfrieda Tarry: Benita Stabile Other Clinician: Referring Shakerria Parran: Benita Stabile Treating Sylvia Helms/Extender: Melburn Hake, HOYT Weeks  in Treatment: 3 Vital Signs Time Taken: 09:59 Temperature (F): 98.1 Height (in): 73 Pulse (bpm): 85 Weight (lbs): 231 Respiratory Rate (breaths/min): 16 Body Mass Index (BMI): 30.5 Blood Pressure (mmHg): 151/73 Reference Range: 80 - 120 mg / dl Electronic Signature(s) Signed: 03/30/2017 4:54:11 PM By: Montey Hora Entered By: Montey Hora on 03/30/2017 10:01:30

## 2017-04-01 NOTE — Progress Notes (Signed)
RYDELL, WIEGEL ([id number]) Visit Report for 03/30/2017 Chief Complaint Document Details Patient Name: [name], [name]. Date of Service: 03/30/2017 10:00 AM Medical Record Number: [id number] Patient Account Number: [id number] Date of Birth/Sex: 1950/05/12 (67 y.o. Male) Treating RN: Ahmed Prima Primary Care Provider: Benita Stabile Other Clinician: Referring Provider: Benita Stabile Treating Provider/Extender: Melburn Hake, HOYT Weeks in Treatment: 3 Information Obtained from: Patient Chief Complaint Right plantar foot ulcer Electronic Signature(s) Signed: 03/31/2017 12:09:36 AM By: Worthy Keeler PA-C Entered By: Worthy Keeler on 03/30/2017 10:37:41 Dowse, [name] ([id number]) -------------------------------------------------------------------------------- Debridement Details Patient Name: [name]. Date of Service: 03/30/2017 10:00 AM Medical Record Number: [id number] Patient Account Number: [id number] Date of Birth/Sex: Feb 01, 1950 (67 y.o. Male) Treating RN: Ahmed Prima Primary Care Provider: TATE, Sharlet Salina Other Clinician: Referring Provider: Benita Stabile Treating Provider/Extender: STONE III, HOYT Weeks in Treatment: 3 Debridement Performed for Wound #1 Right,Plantar Foot Assessment: Performed By: Physician STONE III, HOYT E., PA-C Debridement: Debridement Severity of Tissue Pre Fat layer exposed Debridement: Pre-procedure Verification/Time Yes - 10:45 Out Taken: Start Time: 10:46 Pain Control: Lidocaine 4% Topical Solution Level: Skin/Subcutaneous Tissue Total Area Debrided (L x W): 0.4 (cm) x 0.2 (cm) = 0.08 (cm) Tissue and other material Viable, Non-Viable, Callus, Exudate, Fibrin/Slough, Subcutaneous debrided: Instrument: Curette Bleeding: Minimum Hemostasis Achieved: Pressure End Time: 10:52 Procedural Pain: 0 Post Procedural Pain: 0 Response to Treatment: Procedure was tolerated well Post Debridement Measurements of Total Wound Length: (cm)  0.4 Width: (cm) 0.3 Depth: (cm) 0.4 Volume: (cm) 0.038 Character of Wound/Ulcer Post Debridement: Requires Further Debridement Severity of Tissue Post Debridement: Fat layer exposed Post Procedure Diagnosis Same as Pre-procedure Electronic Signature(s) Signed: 03/31/2017 12:09:36 AM By: Worthy Keeler PA-C Signed: 03/31/2017 8:38:15 AM By: Alric Quan Entered By: Alric Quan on 03/30/2017 10:51:32 Livesey, [name] ([id number]) -------------------------------------------------------------------------------- HPI Details Patient Name: [name]. Date of Service: 03/30/2017 10:00 AM Medical Record Number: [id number] Patient Account Number: [id number] Date of Birth/Sex: 12-26-1950 (67 y.o. Male) Treating RN: Ahmed Prima Primary Care Provider: Benita Stabile Other Clinician: Referring Provider: Benita Stabile Treating Provider/Extender: STONE III, HOYT Weeks in Treatment: 3 History of Present Illness Associated Signs and Symptoms: Patient has a history of diabetes mellitus type II, hypertension, and atrial fibrillation. He also has a hammertoe deformity of the bilateral feet which seems to be causing pressure in the ball of his foot. HPI Description: 03/09/17 on evaluation today patient presents for his initial evaluation concerning an ulcer on the plantar aspect of his right foot which has been open he tells me for about three weeks. He has been seen at [facility] where they did perform debridement it appears according to a note on 02/22/17 where unfortunately it appears that his callous area began to break down into an ulcer after modifications have been added to his insulin. This had happened previously as well. However I do not have details of the severity of debridement at that point although it does not sound as if you the debridement was too significant based on what the patient is telling me that he still had a lot of callous following debridement. With that being  said they were going to look into altering his insoles to try to prevent further breakdown in the future. Nonetheless he has had foul odor discharge coming from the ulcer which is noted all the way back to that visit [facility]. Patient states that this was concerning him more than anything else. He does  have diabetes although he described this as "borderline diabetes" he is on metoprolol however along with lisinopril. Patient is not having any issues with pain in regard to his right plantar foot. 03/16/17 on evaluation today patient appears to be doing much better in regard to his plantar foot ulcer. He actually tells me that he loves the peg assist offloading shoe and that he hasn't had any pain in the ulcer area from the callous since I worked on it last week. Overall he is extremely happy with how things have progressed. Likewise the wound bed has no slough noted there is no evidence of infection and it looks excellent. I did receive the results of his hemoglobin A1c which showed a value of 5.9 which was elevated but actually rather well. Subsequently I did also receive the x-ray of his foot which showed diffuse degenerative change but no underlying acute bony abnormality. 03/23/17 on evaluation today patient appears to be doing better in regard to his right plantar foot ulcer. He continues to show signs of improvement there's definitely not as much drainage at this point. He has been tolerating the dressing changes without complication he is not happy with the peg assist offloading shoe but at the same time I do believe that it is making good progress as far as offloading is concerned. I still believe he may need to talk to a surgeon about surgically correcting a hammertoe in order to avoid additional pressure to the site especially since he Artie has diabetic shoes and they do not seem to have prevented callous buildup in ulcer formation. 03/30/17 on evaluation today patient  appears to be doing better in regard to his right plantar foot ulcer. Continues to show signs of improvement which is good news. Unfortunately we were unable to get the appointment with the orthopedic, Dr. Doran Durand, whom I recommended for him due to patient having a balance at the practice. With that being said patient's foot ulcer does seem to be doing much better on evaluation today he still has some depth to the wound but overall we are seeing improvement and epithelialization week by week. Hopefully this is something that will close shortly. Electronic Signature(s) Signed: 03/31/2017 12:09:36 AM By: Worthy Keeler PA-C Entered By: Worthy Keeler on 03/30/2017 23:21:34 Blomgren, [name] ([id number]) -------------------------------------------------------------------------------- Physical Exam Details Patient Name: [name], [name]. Date of Service: 03/30/2017 10:00 AM Medical Record Number: [id number] Patient Account Number: [id number] Date of Birth/Sex: 1950/08/12 (67 y.o. Male) Treating RN: Ahmed Prima Primary Care Provider: Benita Stabile Other Clinician: Referring Provider: TATE, Sharlet Salina Treating Provider/Extender: STONE III, HOYT Weeks in Treatment: 3 Constitutional Well-nourished and well-hydrated in no acute distress. Respiratory normal breathing without difficulty. Psychiatric this patient is able to make decisions and demonstrates good insight into disease process. Alert and Oriented x 3. pleasant and cooperative. Notes Patient does not seem to have any evidence of infection at this point in time. He does have some callous surrounding the wound bed along with slough noted in the central portion of the wound which did require sharp debridement today he tolerated this with no pain he does have neuropathy. Electronic Signature(s) Signed: 03/31/2017 12:09:36 AM By: Worthy Keeler PA-C Entered By: Worthy Keeler on 03/30/2017 23:22:24 DARRICK, GREENLAW  ([id number]) -------------------------------------------------------------------------------- Physician Orders Details Patient Name: [name], [name]. Date of Service: 03/30/2017 10:00 AM Medical Record Number: [id number] Patient Account Number: [id number] Date of Birth/Sex: February 11, 1950 (67 y.o. Male) Treating RN: Ahmed Prima Primary Care Provider: TATE, [facility]  Other Clinician: Referring Provider: TATE, Sharlet Salina Treating Provider/Extender: Melburn Hake, HOYT Weeks in Treatment: 3 Verbal / Phone Orders: Yes Clinician: Pinkerton, Debi Read Back and Verified: Yes Diagnosis Coding ICD-10 Coding Code Description E11.621 Type 2 diabetes mellitus with foot ulcer L97.512 Non-pressure chronic ulcer of other part of right foot with fat layer exposed I10 Essential (primary) hypertension I48.2 Chronic atrial fibrillation Wound Cleansing Wound #1 Right,Plantar Foot o Clean wound with Normal Saline. Anesthetic (add to Medication List) Wound #1 Right,Plantar Foot o Topical Lidocaine 4% cream applied to wound bed prior to debridement (In Clinic Only). Skin Barriers/Peri-Wound Care Wound #1 Right,Plantar Foot o Skin Prep Primary Wound Dressing Wound #1 Right,Plantar Foot o Prisma Ag - moisten with saline Secondary Dressing Wound #1 Right,Plantar Foot o Dry Gauze o Conform/Kerlix o Coban o Foam - or felt for offloading Dressing Change Frequency Wound #1 Right,Plantar Foot o Change dressing every day. Follow-up Appointments Wound #1 Right,Plantar Foot o Return Appointment in 1 week. Edema Control Wound #1 Right,Plantar Foot [name], [name] ([id number]) o Elevate legs to the level of the heart and pump ankles as often as possible Off-Loading Wound #1 Right,Plantar Foot o Open toe surgical shoe with peg assist. Additional Orders / Instructions Wound #1 Right,Plantar Foot o Increase protein intake. Medications-please add to medication list. Wound #1 Right,Plantar  Foot o P.O. Antibiotics - Take antibiotics as prescribed Patient Medications Allergies: penicillin Notifications Medication Indication Start End lidocaine DOSE 1 - topical 4 % cream - 1 cream topical Electronic Signature(s) Signed: 03/31/2017 12:09:36 AM By: Worthy Keeler PA-C Signed: 03/31/2017 8:38:15 AM By: Alric Quan Previous Signature: 03/30/2017 10:58:37 AM Version By: Worthy Keeler PA-C Entered By: Alric Quan on 03/30/2017 11:04:35 Osowski, [name] ([id number]) -------------------------------------------------------------------------------- Prescription 03/30/2017 Patient Name: [name] Provider: Worthy Keeler PA-C Date of Birth: 04-19-1950 NPI#: [id number] Sex: M DEA#: [id number] Phone #: [phone] License #: Patient Address: [facility] [place], [place] [address] [address] [place], [place] [address] [phone] Allergies penicillin Medication Medication: Route: Strength: Form: lidocaine 4 % topical cream topical 4% cream Class: TOPICAL LOCAL ANESTHETICS Dose: Frequency / Time: Indication: 1 1 cream topical Number of Refills: Number of Units: 0 Generic Substitution: Start Date: End Date: One Time Use: Substitution Permitted No Note to Pharmacy: Signature(s): Date(s): Electronic Signature(s) Signed: 03/31/2017 12:09:36 AM By: Worthy Keeler PA-C Signed: 03/31/2017 8:38:15 AM By: Alric Quan Entered By: Alric Quan on 03/30/2017 11:04:36 Car, [name] ([id number]) --------------------------------------------------------------------------------  Problem List Details Patient Name: [name]. Date of Service: 03/30/2017 10:00 AM Medical Record Number: [id number] Patient Account Number: [id number] Date of Birth/Sex: 06-06-1950 (67 y.o. Male) Treating RN: Ahmed Prima Primary Care Provider: Benita Stabile Other  Clinician: Referring Provider: Benita Stabile Treating Provider/Extender: Melburn Hake, HOYT Weeks in Treatment: 3 Active Problems ICD-10 Encounter Code Description Active Date Diagnosis E11.621 Type 2 diabetes mellitus with foot ulcer 03/09/2017 Yes L97.512 Non-pressure chronic ulcer of other part of right foot with fat layer 03/09/2017 Yes exposed [place] (primary) hypertension 03/09/2017 Yes I48.2 Chronic atrial fibrillation 03/09/2017 Yes Inactive Problems Resolved Problems Electronic Signature(s) Signed: 03/31/2017 12:09:36 AM By: Worthy Keeler PA-C Entered By: Worthy Keeler on 03/30/2017 10:37:34 Mcsorley, [name] ([id number]) -------------------------------------------------------------------------------- Progress Note Details Patient Name: [name]. Date of Service: 03/30/2017 10:00 AM Medical Record Number: [id number] Patient Account Number: [id number] Date of Birth/Sex: Mar 12, 1950 (67 y.o. Male) Treating RN: Carolyne Fiscal, Debi Primary  Care Provider: Benita Stabile Other Clinician: Referring Provider: TATE, Sharlet Salina Treating Provider/Extender: Melburn Hake, HOYT Weeks in Treatment: 3 Subjective Chief Complaint Information obtained from Patient Right plantar foot ulcer History of Present Illness (HPI) The following HPI elements were documented for the patient's wound: Associated Signs and Symptoms: Patient has a history of diabetes mellitus type II, hypertension, and atrial fibrillation. He also has a hammertoe deformity of the bilateral feet which seems to be causing pressure in the ball of his foot. 03/09/17 on evaluation today patient presents for his initial evaluation concerning an ulcer on the plantar aspect of his right foot which has been open he tells me for about three weeks. He has been seen at [facility] where they did perform debridement it appears according to a note on 02/22/17 where unfortunately it appears that his callous area began to break down into an  ulcer after modifications have been added to his insulin. This had happened previously as well. However I do not have details of the severity of debridement at that point although it does not sound as if you the debridement was too significant based on what the patient is telling me that he still had a lot of callous following debridement. With that being said they were going to look into altering his insoles to try to prevent further breakdown in the future. Nonetheless he has had foul odor discharge coming from the ulcer which is noted all the way back to that [facility] Patient states that this was concerning him more than anything else. He does have diabetes although he described this as "borderline diabetes" he is on metoprolol however along with lisinopril. Patient is not having any issues with pain in regard to his right plantar foot. 03/16/17 on evaluation today patient appears to be doing much better in regard to his plantar foot ulcer. He actually tells me that he loves the peg assist offloading shoe and that he hasn't had any pain in the ulcer area from the callous since I worked on it last week. Overall he is extremely happy with how things have progressed. Likewise the wound bed has no slough noted there is no evidence of infection and it looks excellent. I did receive the results of his hemoglobin A1c which showed a value of 5.9 which was elevated but actually rather well. Subsequently I did also receive the x-ray of his foot which showed diffuse degenerative change but no underlying acute bony abnormality. 03/23/17 on evaluation today patient appears to be doing better in regard to his right plantar foot ulcer. He continues to show signs of improvement there's definitely not as much drainage at this point. He has been tolerating the dressing changes without complication he is not happy with the peg assist offloading shoe but at the same time I do believe that  it is making good progress as far as offloading is concerned. I still believe he may need to talk to a surgeon about surgically correcting a hammertoe in order to avoid additional pressure to the site especially since he Artie has diabetic shoes and they do not seem to have prevented callous buildup in ulcer formation. 03/30/17 on evaluation today patient appears to be doing better in regard to his right plantar foot ulcer. Continues to show signs of improvement which is good news. Unfortunately we were unable to get the appointment with the orthopedic, Dr. Doran Durand, whom I recommended for him due to patient having a balance at  the practice. With that being said patient's foot ulcer does seem to be doing much better on evaluation today he still has some depth to the wound but overall we are seeing improvement and epithelialization week by week. Hopefully this is something that will close shortly. Patient History Information obtained from Patient. JAYSEAN, MANVILLE ([id number]) Family History Cancer - Siblings, Diabetes - Father, Heart Disease - Mother, Hypertension - Mother, No family history of Hereditary Spherocytosis, Kidney Disease, Lung Disease, Seizures, Stroke, Thyroid Problems, Tuberculosis. Social History Former smoker, Marital Status - Single, Alcohol Use - Never, Drug Use - No History, Caffeine Use - Moderate. Review of Systems (ROS) Constitutional Symptoms (General Health) Denies complaints or symptoms of Fever, Chills. Respiratory The patient has no complaints or symptoms. Cardiovascular The patient has no complaints or symptoms. Psychiatric The patient has no complaints or symptoms. Objective Constitutional Well-nourished and well-hydrated in no acute distress. Vitals Time Taken: 9:59 AM, Height: 73 in, Weight: 231 lbs, BMI: 30.5, Temperature: 98.1 F, Pulse: 85 bpm, Respiratory Rate: 16 breaths/min, Blood Pressure: 151/73 mmHg. Respiratory normal breathing without  difficulty. Psychiatric this patient is able to make decisions and demonstrates good insight into disease process. Alert and Oriented x 3. pleasant and cooperative. General Notes: Patient does not seem to have any evidence of infection at this point in time. He does have some callous surrounding the wound bed along with slough noted in the central portion of the wound which did require sharp debridement today he tolerated this with no pain he does have neuropathy. Integumentary (Hair, Skin) Wound #1 status is Open. Original cause of wound was Gradually Appeared. The wound is located on the [place]. The wound measures 0.4cm length x 0.2cm width x 0.3cm depth; 0.063cm^2 area and 0.019cm^3 volume. There is Fat Layer (Subcutaneous Tissue) Exposed exposed. There is no tunneling or undermining noted. There is a large amount of serous drainage noted. Foul odor after cleansing was noted. The wound margin is flat and intact. There is large (67-100%) red granulation within the wound bed. There is a small (1-33%) amount of necrotic tissue within the wound bed including Adherent Slough. The periwound skin appearance exhibited: Callus, Maceration. The periwound skin appearance did not exhibit: Crepitus, Excoriation, Induration, Rash, Scarring, Dry/Scaly, Atrophie Blanche, Cyanosis, Ecchymosis, Hemosiderin Staining, Mottled, Pallor, Rubor, Erythema. Periwound temperature was noted as No Abnormality. The periwound has tenderness on palpation. [name], [name] ([id number]) Assessment Active Problems ICD-10 E11.621 - Type 2 diabetes mellitus with foot ulcer L97.512 - Non-pressure chronic ulcer of other part of right foot with fat layer exposed I10 - Essential (primary) hypertension I48.2 - Chronic atrial fibrillation Procedures Wound #1 Pre-procedure diagnosis of Wound #1 is a Diabetic Wound/Ulcer of the Lower Extremity located on the Right,Plantar Foot .Severity of Tissue Pre Debridement is:  Fat layer exposed. There was a Skin/Subcutaneous Tissue Debridement ([id number]) debridement with total area of 0.08 sq cm performed by STONE III, HOYT E., PA-C. with the following instrument(s): Curette to remove Viable and Non-Viable tissue/material including Exudate, Fibrin/Slough, Callus, and Subcutaneous after achieving pain control using Lidocaine 4% Topical Solution. A time out was conducted at 10:45, prior to the start of the procedure. A Minimum amount of bleeding was controlled with Pressure. The procedure was tolerated well with a pain level of 0 throughout and a pain level of 0 following the procedure. Post Debridement Measurements: 0.4cm length x 0.3cm width x 0.4cm depth; 0.038cm^3 volume. Character of Wound/Ulcer Post Debridement requires further debridement. Severity of Tissue Post Debridement  is: Fat layer exposed. Post procedure Diagnosis Wound #1: Same as Pre-Procedure Plan Wound Cleansing: Wound #1 Right,Plantar Foot: Clean wound with Normal Saline. Anesthetic (add to Medication List): Wound #1 Right,Plantar Foot: Topical Lidocaine 4% cream applied to wound bed prior to debridement (In Clinic Only). Skin Barriers/Peri-Wound Care: Wound #1 Right,Plantar Foot: Skin Prep Primary Wound Dressing: Wound #1 Right,Plantar Foot: Prisma Ag - moisten with saline Secondary Dressing: Wound #1 Right,Plantar Foot: Dry Gauze Conform/Kerlix Coban Foam - or felt for offloading Dressing Change Frequency: [name], DORKO. ([id number]) Wound #1 Right,Plantar Foot: Change dressing every day. Follow-up Appointments: Wound #1 Right,Plantar Foot: Return Appointment in 1 week. Edema Control: Wound #1 Right,Plantar Foot: Elevate legs to the level of the heart and pump ankles as often as possible Off-Loading: Wound #1 Right,Plantar Foot: Open toe surgical shoe with peg assist. Additional Orders / Instructions: Wound #1 Right,Plantar Foot: Increase protein  intake. Medications-please add to medication list.: Wound #1 Right,Plantar Foot: P.O. Antibiotics - Take antibiotics as prescribed The following medication(s) was prescribed: lidocaine topical 4 % cream 1 1 cream topical was prescribed at facility At this point I'm gonna recommend that we continue with the Current wound care measures for the next week although I am going to change and add Drawtex overlying the Prisma in order to help hold it to the wound bed and promote proper drainage and hopefully improve the epithelialization from the bottom up. He will continue with the peg assist shoe. We will see him for reevaluation following. Please see above for specific wound care orders. We will see patient for re-evaluation in 1 week(s) here in the clinic. If anything worsens or changes patient will contact our office for additional recommendations. Electronic Signature(s) Signed: 03/31/2017 12:09:36 AM By: Worthy Keeler PA-C Entered By: Worthy Keeler on 03/30/2017 23:23:08 [name], [name] ([id number]) -------------------------------------------------------------------------------- ROS/PFSH Details Patient Name: [name], [name]. Date of Service: 03/30/2017 10:00 AM Medical Record Number: [id number] Patient Account Number: [id number] Date of Birth/Sex: 1950/02/19 (67 y.o. Male) Treating RN: Ahmed Prima Primary Care Provider: TATE, Sharlet Salina Other Clinician: Referring Provider: Benita Stabile Treating Provider/Extender: STONE III, HOYT Weeks in Treatment: 3 Information Obtained From Patient Wound History Do you currently have one or more open woundso Yes How many open wounds do you currently haveo 1 Approximately how long have you had your woundso 3 weeks How have you been treating your wound(s) until nowo mupirocin Has your wound(s) ever healed and then re-openedo No Have you had any lab work done in the past montho No Have you tested positive for an antibiotic resistant organism (MRSA,  VRE)o No Have you tested positive for osteomyelitis (bone infection)o No Have you had any tests for circulation on your legso No Constitutional Symptoms (General Health) Complaints and Symptoms: Negative for: Fever; Chills Eyes Medical History: Negative for: Cataracts; Glaucoma; Optic Neuritis Ear/Nose/Mouth/Throat Medical History: Negative for: Chronic sinus problems/congestion; Middle ear problems Hematologic/Lymphatic Medical History: Negative for: Anemia; Hemophilia; Human Immunodeficiency Virus; Lymphedema; Sickle Cell Disease Respiratory Complaints and Symptoms: No Complaints or Symptoms Medical History: Negative for: Aspiration; Asthma; Chronic Obstructive Pulmonary Disease (COPD); Pneumothorax; Sleep Apnea; Tuberculosis Cardiovascular Complaints and Symptoms: No Complaints or Symptoms Medical History: Positive for: Arrhythmia - a fib; Hypertension Negative for: Angina; Congestive Heart Failure; Coronary Artery Disease; Deep Vein Thrombosis; Hypotension; Myocardial [name], [name] ([id number]) Infarction; Peripheral Arterial Disease; Peripheral Venous Disease; Phlebitis; Vasculitis Gastrointestinal Medical History: Negative for: Cirrhosis ; Colitis; Crohnos; Hepatitis A; Hepatitis B; Hepatitis C Endocrine Medical History: Positive for:  Type II Diabetes Treated with: Oral agents Blood sugar tested every day: No Immunological Medical History: Negative for: Lupus Erythematosus; Raynaudos; Scleroderma Integumentary (Skin) Medical History: Negative for: History of Burn; History of pressure wounds Musculoskeletal Medical History: Positive for: Osteoarthritis Negative for: Gout; Rheumatoid Arthritis; Osteomyelitis Neurologic Medical History: Negative for: Dementia; Neuropathy Oncologic Medical History: Negative for: Received Chemotherapy; Received Radiation Psychiatric Complaints and Symptoms: No Complaints or Symptoms Immunizations Pneumococcal  Vaccine: Received Pneumococcal Vaccination: Yes Immunization Notes: up to date Implantable Devices Family and Social History Cancer: Yes - Siblings; Diabetes: Yes - Father; Heart Disease: Yes - Mother; Hereditary Spherocytosis: No; Hypertension: Yes - Mother; Kidney Disease: No; Lung Disease: No; Seizures: No; Stroke: No; Thyroid Problems: No; Tuberculosis: No; Former smoker; Marital Status - Single; Alcohol Use: Never; Drug Use: No History; Caffeine Use: Moderate; Financial [name], [name] ([id number]) Concerns: No; Food, Clothing or Shelter Needs: No; Support System Lacking: No; Transportation Concerns: No; Advanced Directives: No; Patient does not want information on Advanced Directives Physician Affirmation I have reviewed and agree with the above information. Electronic Signature(s) Signed: 03/31/2017 12:09:36 AM By: Worthy Keeler PA-C Signed: 03/31/2017 8:38:15 AM By: Alric Quan Entered By: Worthy Keeler on 03/30/2017 23:21:57 Waldron, [name] ([id number]) -------------------------------------------------------------------------------- SuperBill Details Patient Name: [name], KELM. Date of Service: 03/30/2017 Medical Record Number: [id number] Patient Account Number: [id number] Date of Birth/Sex: 1950-06-28 (67 y.o. Male) Treating RN: Ahmed Prima Primary Care Provider: TATE, Sharlet Salina Other Clinician: Referring Provider: Benita Stabile Treating Provider/Extender: Melburn Hake, HOYT Weeks in Treatment: 3 Diagnosis Coding ICD-10 Codes Code Description E11.621 Type 2 diabetes mellitus with foot ulcer L97.512 Non-pressure chronic ulcer of other part of right foot with fat layer exposed I10 Essential (primary) hypertension I48.2 Chronic atrial fibrillation Facility Procedures CPT4 Code: [id number] Description: [place] - DEB SUBQ TISSUE 20 SQ CM/< ICD-10 Diagnosis Description L97.512 Non-pressure chronic ulcer of other part of right foot with fa Modifier: t layer exposed Quantity:  1 Physician Procedures CPT4 Code: [id number] Description: 37858 - WC PHYS SUBQ TISS 20 SQ CM ICD-10 Diagnosis Description L97.512 Non-pressure chronic ulcer of other part of right foot with fa Modifier: t layer exposed Quantity: 1 Electronic Signature(s) Signed: 03/31/2017 12:09:36 AM By: Worthy Keeler PA-C Entered By: Worthy Keeler on 03/30/2017 23:23:18

## 2017-04-05 ENCOUNTER — Encounter: Payer: PPO | Admitting: Physician Assistant

## 2017-04-05 ENCOUNTER — Other Ambulatory Visit
Admission: RE | Admit: 2017-04-05 | Discharge: 2017-04-05 | Disposition: A | Discharge status: Home / Self Care | Payer: PPO | Source: Ambulatory Visit | Attending: Physician Assistant | Admitting: Physician Assistant

## 2017-04-05 DIAGNOSIS — L97512 Non-pressure chronic ulcer of other part of right foot with fat layer exposed: Secondary | ICD-10-CM | POA: Diagnosis not present

## 2017-04-05 DIAGNOSIS — E11621 Type 2 diabetes mellitus with foot ulcer: Secondary | ICD-10-CM | POA: Diagnosis not present

## 2017-04-05 DIAGNOSIS — B999 Unspecified infectious disease: Secondary | ICD-10-CM | POA: Diagnosis not present

## 2017-04-06 NOTE — Progress Notes (Signed)
VICTORIO, [name] ([id number]) Visit Report for 04/05/2017 Chief Complaint Document Details Patient Name: [name], [name]. Date of Service: 04/05/2017 10:15 AM Medical Record Number: [id number] Patient Account Number: [id number] Date of Birth/Sex: 1950-06-25 (66 y.o. Male) Treating RN: Roger Shelter Primary Care Provider: Benita Stabile Other Clinician: Referring Provider: Benita Stabile Treating Provider/Extender: Melburn Hake, Len Azeez Weeks in Treatment: 3 Information Obtained from: Patient Chief Complaint Right plantar foot ulcer Electronic Signature(s) Signed: 04/06/2017 12:14:27 AM By: Worthy Keeler PA-C Entered By: Worthy Keeler on 04/05/2017 10:25:26 Blansett, [name] ([id number]) -------------------------------------------------------------------------------- Debridement Details Patient Name: [name]. Date of Service: 04/05/2017 10:15 AM Medical Record Number: [id number] Patient Account Number: [id number] Date of Birth/Sex: 10-14-1950 (67 y.o. Male) Treating RN: Roger Shelter Primary Care Provider: TATE, Sharlet Salina Other Clinician: Referring Provider: Benita Stabile Treating Provider/Extender: Melburn Hake, Nilaya Bouie Weeks in Treatment: 3 Debridement Performed for Wound #1 Right,Plantar Foot Assessment: Performed By: Physician STONE III, Zayley Arras E., PA-C Debridement: Debridement Severity of Tissue Pre Fat layer exposed Debridement: Pre-procedure Verification/Time Yes - 11:27 Out Taken: Start Time: 11:27 Pain Control: Other : lidocaine 4% Level: Skin/Subcutaneous Tissue Total Area Debrided (L x W): 0.4 (cm) x 0.2 (cm) = 0.08 (cm) Tissue and other material Non-Viable, Callus, Skin, Subcutaneous debrided: Instrument: Curette Bleeding: Minimum Hemostasis Achieved: Pressure End Time: 11:28 Procedural Pain: 0 Post Procedural Pain: 0 Response to Treatment: Procedure was tolerated well Post Debridement Measurements of Total Wound Length: (cm) 0.4 Width: (cm) 0.2 Depth: (cm)  0.8 Volume: (cm) 0.05 Character of Wound/Ulcer Post Debridement: Stable Severity of Tissue Post Debridement: Fat layer exposed Post Procedure Diagnosis Same as Pre-procedure Electronic Signature(s) Signed: 04/05/2017 4:59:35 PM By: Roger Shelter Signed: 04/06/2017 12:14:27 AM By: Worthy Keeler PA-C Entered By: Roger Shelter on 04/05/2017 11:38:11 Kaspar, [name] ([id number]) -------------------------------------------------------------------------------- HPI Details Patient Name: [name]. Date of Service: 04/05/2017 10:15 AM Medical Record Number: [id number] Patient Account Number: [id number] Date of Birth/Sex: Oct 08, 1950 (67 y.o. Male) Treating RN: Roger Shelter Primary Care Provider: Benita Stabile Other Clinician: Referring Provider: Benita Stabile Treating Provider/Extender: Melburn Hake, Tessah Patchen Weeks in Treatment: 3 History of Present Illness Associated Signs and Symptoms: Patient has a history of diabetes mellitus type II, hypertension, and atrial fibrillation. He also has a hammertoe deformity of the bilateral feet which seems to be causing pressure in the ball of his foot. HPI Description: 03/09/17 on evaluation today patient presents for his initial evaluation concerning an ulcer on the plantar aspect of his right foot which has been open he tells me for about three weeks. He has been seen at [facility] where they did perform debridement it appears according to a note on 02/22/17 where unfortunately it appears that his callous area began to break down into an ulcer after modifications have been added to his insulin. This had happened previously as well. However I do not have details of the severity of debridement at that point although it does not sound as if you the debridement was too significant based on what the patient is telling me that he still had a lot of callous following debridement. With that being said they were going to look into altering his insoles to  try to prevent further breakdown in the future. Nonetheless he has had foul odor discharge coming from the ulcer which is noted all the way back to that visit [facility]. Patient states that this was concerning him more than anything else. He does have diabetes although he  described this as "borderline diabetes" he is on metoprolol however along with lisinopril. Patient is not having any issues with pain in regard to his right plantar foot. 03/16/17 on evaluation today patient appears to be doing much better in regard to his plantar foot ulcer. He actually tells me that he loves the peg assist offloading shoe and that he hasn't had any pain in the ulcer area from the callous since I worked on it last week. Overall he is extremely happy with how things have progressed. Likewise the wound bed has no slough noted there is no evidence of infection and it looks excellent. I did receive the results of his hemoglobin A1c which showed a value of 5.9 which was elevated but actually rather well. Subsequently I did also receive the x-ray of his foot which showed diffuse degenerative change but no underlying acute bony abnormality. 03/23/17 on evaluation today patient appears to be doing better in regard to his right plantar foot ulcer. He continues to show signs of improvement there's definitely not as much drainage at this point. He has been tolerating the dressing changes without complication he is not happy with the peg assist offloading shoe but at the same time I do believe that it is making good progress as far as offloading is concerned. I still believe he may need to talk to a surgeon about surgically correcting a hammertoe in order to avoid additional pressure to the site especially since he Artie has diabetic shoes and they do not seem to have prevented callous buildup in ulcer formation. 03/30/17 on evaluation today patient appears to be doing better in regard to his right plantar  foot ulcer. Continues to show signs of improvement which is good news. Unfortunately we were unable to get the appointment with the orthopedic, Dr. Doran Durand, whom I recommended for him due to patient having a balance at the practice. With that being said patient's foot ulcer does seem to be doing much better on evaluation today he still has some depth to the wound but overall we are seeing improvement and epithelialization week by week. Hopefully this is something that will close shortly. 04/05/17 on evaluation today patient unfortunately has what appears to be an area of the plantar surface of his ulcer where he had fluid collection where the callous grew over top of the wound bed. Unfortunately there was some pus like material noted during cleansing that we did sent for culture today. Hopefully this is not truly an infection is or does not appear to be any evidence of erythema surrounding and maybe this is just simply a small setback with a fluid collection that has caused this issue. Nonetheless we will see what that shows when we get the culture back. I am also going to have it completely antibiotic which I previously placed him on anyway which will hopefully prevent any true infection from setting in. Electronic Signature(s) Signed: 04/06/2017 12:14:27 AM By: Worthy Keeler PA-C Entered By: Worthy Keeler on 04/05/2017 12:51:59 Priestly, RANEN DOOLIN ([id number]) ZAION, HREHA ([id number]) -------------------------------------------------------------------------------- Physical Exam Details Patient Name: TOREZ, BEAUREGARD. Date of Service: 04/05/2017 10:15 AM Medical Record Number: [id number] Patient Account Number: [id number] Date of Birth/Sex: [birth year] (67 y.o. Male) Treating RN: Roger Shelter Primary Care Provider: Benita Stabile Other Clinician: Referring Provider: TATE, Sharlet Salina Treating Provider/Extender: STONE III, Myrle Dues Weeks in Treatment: 3 Constitutional Well-nourished and well-hydrated  in no acute distress. Respiratory normal breathing without difficulty. clear to auscultation bilaterally. Cardiovascular regular rate and rhythm  with normal S1, S2. Psychiatric this patient is able to make decisions and demonstrates good insight into disease process. Alert and Oriented x 3. pleasant and cooperative. Notes Patient's wound bed did show callous overlying the surface of the wound there did not appear to be any evidence of erythema surrounding the wound bed in the periwound location. He did have some Slough over the central portion of the wound when this was finally cleared away and could see through the callous. Overall post debridement the wound appear to be doing much better and I'm hopeful this is just a minor setback as far as the wound is concerned although I am going to send this for a wound culture. Electronic Signature(s) Signed: 04/06/2017 12:14:27 AM By: Worthy Keeler PA-C Entered By: Worthy Keeler on 04/05/2017 12:52:48 Weinberg, [name] ([id number]) -------------------------------------------------------------------------------- Physician Orders Details Patient Name: ILYAAS, MUSTO. Date of Service: 04/05/2017 10:15 AM Medical Record Number: [id number] Patient Account Number: [id number] Date of Birth/Sex: [birth year] (67 y.o. Male) Treating RN: Roger Shelter Primary Care Provider: Benita Stabile Other Clinician: Referring Provider: Benita Stabile Treating Provider/Extender: Melburn Hake, Caitlain Tweed Weeks in Treatment: 3 Verbal / Phone Orders: No Diagnosis Coding ICD-10 Coding Code Description E11.621 Type 2 diabetes mellitus with foot ulcer L97.512 Non-pressure chronic ulcer of other part of right foot with fat layer exposed I10 Essential (primary) hypertension I48.2 Chronic atrial fibrillation Wound Cleansing Wound #1 Right,Plantar Foot o Clean wound with Normal Saline. Anesthetic (add to Medication List) Wound #1 Right,Plantar Foot o Topical Lidocaine 4%  cream applied to wound bed prior to debridement (In Clinic Only). Skin Barriers/Peri-Wound Care Wound #1 Right,Plantar Foot o Skin Prep o Other: - silver nitrate in clinic to stop bleeding Primary Wound Dressing Wound #1 Right,Plantar Foot o Other: - silvercell Secondary Dressing Wound #1 Right,Plantar Foot o Dry Gauze o Coban - conform o Foam - or felt for offloading Dressing Change Frequency Wound #1 Right,Plantar Foot o Change dressing every day. Follow-up Appointments Wound #1 Right,Plantar Foot o Return Appointment in 1 week. Edema Control Wound #1 Right,Plantar Foot Zuch, Tremayne J. ([id number]) o Elevate legs to the level of the heart and pump ankles as often as possible Off-Loading Wound #1 Right,Plantar Foot o Open toe surgical shoe with peg assist. Additional Orders / Instructions Wound #1 Right,Plantar Foot o Increase protein intake. Medications-please add to medication list. Wound #1 Right,Plantar Foot o P.O. Antibiotics - Take antibiotics as prescribed Laboratory o Bacteria identified in Wound by Culture (MICRO) - right plantar oooo LOINC Code: 9476-5 oooo Convenience Name: Wound culture routine Electronic Signature(s) Signed: 04/05/2017 4:59:35 PM By: Roger Shelter Signed: 04/06/2017 12:14:27 AM By: Worthy Keeler PA-C Entered By: Roger Shelter on 04/05/2017 11:38:55 Gabrielsen, [name] ([id number]) -------------------------------------------------------------------------------- Problem List Details Patient Name: BRENDA, COWHER. Date of Service: 04/05/2017 10:15 AM Medical Record Number: [id number] Patient Account Number: [id number] Date of Birth/Sex: 10-Feb-1950 (67 y.o. Male) Treating RN: Roger Shelter Primary Care Provider: Benita Stabile Other Clinician: Referring Provider: Benita Stabile Treating Provider/Extender: Melburn Hake, Thomson Herbers Weeks in Treatment: 3 Active Problems ICD-10 Encounter Code Description Active  Date Diagnosis E11.621 Type 2 diabetes mellitus with foot ulcer 03/09/2017 Yes L97.512 Non-pressure chronic ulcer of other part of right foot with fat layer 03/09/2017 Yes exposed [place] (primary) hypertension 03/09/2017 Yes I48.2 Chronic atrial fibrillation 03/09/2017 Yes Inactive Problems Resolved Problems Electronic Signature(s) Signed: 04/06/2017 12:14:27 AM By: Worthy Keeler PA-C Entered By: Worthy Keeler on 04/05/2017 10:25:18 Loney, [name] ([id number]) -------------------------------------------------------------------------------- Progress  Note Details Patient Name: CHEO, SELVEY. Date of Service: 04/05/2017 10:15 AM Medical Record Number: [id number] Patient Account Number: [id number] Date of Birth/Sex: Mar 19, 1950 (67 y.o. Male) Treating RN: Roger Shelter Primary Care Provider: Benita Stabile Other Clinician: Referring Provider: Benita Stabile Treating Provider/Extender: Melburn Hake, Ebonie Westerlund Weeks in Treatment: 3 Subjective Chief Complaint Information obtained from Patient Right plantar foot ulcer History of Present Illness (HPI) The following HPI elements were documented for the patient's wound: Associated Signs and Symptoms: Patient has a history of diabetes mellitus type II, hypertension, and atrial fibrillation. He also has a hammertoe deformity of the bilateral feet which seems to be causing pressure in the ball of his foot. 03/09/17 on evaluation today patient presents for his initial evaluation concerning an ulcer on the plantar aspect of his right foot which has been open he tells me for about three weeks. He has been seen at [facility] where they did perform debridement it appears according to a note on 02/22/17 where unfortunately it appears that his callous area began to break down into an ulcer after modifications have been added to his insulin. This had happened previously as well. However I do not have details of the severity of debridement at that point  although it does not sound as if you the debridement was too significant based on what the patient is telling me that he still had a lot of callous following debridement. With that being said they were going to look into altering his insoles to try to prevent further breakdown in the future. Nonetheless he has had foul odor discharge coming from the ulcer which is noted all the way back to that [facility] Patient states that this was concerning him more than anything else. He does have diabetes although he described this as "borderline diabetes" he is on metoprolol however along with lisinopril. Patient is not having any issues with pain in regard to his right plantar foot. 03/16/17 on evaluation today patient appears to be doing much better in regard to his plantar foot ulcer. He actually tells me that he loves the peg assist offloading shoe and that he hasn't had any pain in the ulcer area from the callous since I worked on it last week. Overall he is extremely happy with how things have progressed. Likewise the wound bed has no slough noted there is no evidence of infection and it looks excellent. I did receive the results of his hemoglobin A1c which showed a value of 5.9 which was elevated but actually rather well. Subsequently I did also receive the x-ray of his foot which showed diffuse degenerative change but no underlying acute bony abnormality. 03/23/17 on evaluation today patient appears to be doing better in regard to his right plantar foot ulcer. He continues to show signs of improvement there's definitely not as much drainage at this point. He has been tolerating the dressing changes without complication he is not happy with the peg assist offloading shoe but at the same time I do believe that it is making good progress as far as offloading is concerned. I still believe he may need to talk to a surgeon about surgically correcting a hammertoe in order to avoid  additional pressure to the site especially since he Artie has diabetic shoes and they do not seem to have prevented callous buildup in ulcer formation. 03/30/17 on evaluation today patient appears to be doing better in regard to his right plantar foot ulcer. Continues to  show signs of improvement which is good news. Unfortunately we were unable to get the appointment with the orthopedic, Dr. Doran Durand, whom I recommended for him due to patient having a balance at the practice. With that being said patient's foot ulcer does seem to be doing much better on evaluation today he still has some depth to the wound but overall we are seeing improvement and epithelialization week by week. Hopefully this is something that will close shortly. 04/05/17 on evaluation today patient unfortunately has what appears to be an area of the plantar surface of his ulcer where he had fluid collection where the callous grew over top of the wound bed. Unfortunately there was some pus like material noted during cleansing that we did sent for culture today. Hopefully this is not truly an infection is or does not appear to be any ARVLE, GRABE. ([id number]) evidence of erythema surrounding and maybe this is just simply a small setback with a fluid collection that has caused this issue. Nonetheless we will see what that shows when we get the culture back. I am also going to have it completely antibiotic which I previously placed him on anyway which will hopefully prevent any true infection from setting in. Patient History Information obtained from Patient. Family History Cancer - Siblings, Diabetes - Father, Heart Disease - Mother, Hypertension - Mother, No family history of Hereditary Spherocytosis, Kidney Disease, Lung Disease, Seizures, Stroke, Thyroid Problems, Tuberculosis. Social History Former smoker, Marital Status - Single, Alcohol Use - Never, Drug Use - No History, Caffeine Use - Moderate. Review of Systems  (ROS) Constitutional Symptoms (General Health) Denies complaints or symptoms of Fever, Chills. Respiratory The patient has no complaints or symptoms. Cardiovascular The patient has no complaints or symptoms. Psychiatric The patient has no complaints or symptoms. Objective Constitutional Well-nourished and well-hydrated in no acute distress. Vitals Time Taken: 10:32 AM, Height: 73 in, Weight: 231 lbs, BMI: 30.5, Temperature: 98.3 F, Pulse: 92 bpm, Respiratory Rate: 18 breaths/min, Blood Pressure: 158/74 mmHg. Respiratory normal breathing without difficulty. clear to auscultation bilaterally. Cardiovascular regular rate and rhythm with normal S1, S2. Psychiatric this patient is able to make decisions and demonstrates good insight into disease process. Alert and Oriented x 3. pleasant and cooperative. General Notes: Patient's wound bed did show callous overlying the surface of the wound there did not appear to be any evidence of erythema surrounding the wound bed in the periwound location. He did have some Slough over the central portion of the wound when this was finally cleared away and could see through the callous. Overall post debridement the wound appear to be doing much better and I'm hopeful this is just a minor setback as far as the wound is concerned although I am going to send this for a wound culture. SHER, HELLINGER ([id number]) Integumentary (Hair, Skin) Wound #1 status is Open. Original cause of wound was Gradually Appeared. The wound is located on the [place]. The wound measures 0.4cm length x 0.2cm width x 0.8cm depth; 0.063cm^2 area and 0.05cm^3 volume. There is Fat Layer (Subcutaneous Tissue) Exposed exposed. There is no tunneling or undermining noted. There is a large amount of purulent drainage noted. Foul odor after cleansing was noted. The wound margin is flat and intact. There is large (67-100%) red granulation within the wound bed. There is a small  (1-33%) amount of necrotic tissue within the wound bed including Adherent Slough. The periwound skin appearance exhibited: Callus, Maceration. The periwound skin appearance did not exhibit:  Crepitus, Excoriation, Induration, Rash, Scarring, Dry/Scaly, Atrophie Blanche, Cyanosis, Ecchymosis, Hemosiderin Staining, Mottled, Pallor, Rubor, Erythema. Periwound temperature was noted as No Abnormality. The periwound has tenderness on palpation. Assessment Active Problems ICD-10 E11.621 - Type 2 diabetes mellitus with foot ulcer L97.512 - Non-pressure chronic ulcer of other part of right foot with fat layer exposed I10 - Essential (primary) hypertension I48.2 - Chronic atrial fibrillation Procedures Wound #1 Pre-procedure diagnosis of Wound #1 is a Diabetic Wound/Ulcer of the Lower Extremity located on the [place] .Severity of Tissue Pre Debridement is: Fat layer exposed. There was a Skin/Subcutaneous Tissue Debridement ([id number]) debridement with total area of 0.08 sq cm performed by STONE III, Georgian Mcclory E., PA-C. with the following instrument(s): Curette to remove Non-Viable tissue/material including Skin, Callus, and Subcutaneous after achieving pain control using Other (lidocaine 4%). A time out was conducted at 11:27, prior to the start of the procedure. A Minimum amount of bleeding was controlled with Pressure. The procedure was tolerated well with a pain level of 0 throughout and a pain level of 0 following the procedure. Post Debridement Measurements: 0.4cm length x 0.2cm width x 0.8cm depth; 0.05cm^3 volume. Character of Wound/Ulcer Post Debridement is stable. Severity of Tissue Post Debridement is: Fat layer exposed. Post procedure Diagnosis Wound #1: Same as Pre-Procedure Plan Wound Cleansing: Wound #1 Right,Plantar Foot: Clean wound with Normal Saline. Anesthetic (add to Medication List): Wound #1 Right,Plantar Foot: Topical Lidocaine 4% cream applied to wound bed prior to  debridement (In Clinic Only). Skin Barriers/Peri-Wound Care: Wound #1 Right,Plantar Foot: CORKY, BLUMSTEIN. ([id number]) Skin Prep Other: - silver nitrate in clinic to stop bleeding Primary Wound Dressing: Wound #1 Right,Plantar Foot: Other: - silvercell Secondary Dressing: Wound #1 Right,Plantar Foot: Dry Gauze Coban - conform Foam - or felt for offloading Dressing Change Frequency: Wound #1 Right,Plantar Foot: Change dressing every day. Follow-up Appointments: Wound #1 Right,Plantar Foot: Return Appointment in 1 week. Edema Control: Wound #1 Right,Plantar Foot: Elevate legs to the level of the heart and pump ankles as often as possible Off-Loading: Wound #1 Right,Plantar Foot: Open toe surgical shoe with peg assist. Additional Orders / Instructions: Wound #1 Right,Plantar Foot: Increase protein intake. Medications-please add to medication list.: Wound #1 Right,Plantar Foot: P.O. Antibiotics - Take antibiotics as prescribed Laboratory ordered were: Wound culture routine - right plantar We will at this point switch back to the silver alginate dressing as I feel like he did better with that previously. Patient is in agreement with plan. He still in fact has some of this left over at home. We will see were the wound culture shows and depending on the results will reinitiate antibiotics as necessary for the time being I'm gonna have him continue with his current antibiotic regimen to completion. He is in agreement with this plan. Please see above for specific wound care orders. We will see patient for re-evaluation in 1 week(s) here in the clinic. If anything worsens or changes patient will contact our office for additional recommendations. Electronic Signature(s) Signed: 04/06/2017 12:14:27 AM By: Worthy Keeler PA-C Entered By: Worthy Keeler on 04/05/2017 12:54:22 Lafortune, [name]  ([id number]) -------------------------------------------------------------------------------- ROS/PFSH Details Patient Name: [name] Date of Service: 04/05/2017 10:15 AM Medical Record Number: [id number] Patient Account Number: [id number] Date of Birth/Sex: [birth year] (67 y.o. Male) Treating RN: Roger Shelter Primary Care Provider: Benita Stabile Other Clinician: Referring Provider: Benita Stabile Treating Provider/Extender: Melburn Hake, Lourdes Kucharski Weeks in Treatment: 3 Information Obtained From Patient Wound History Do you currently have  one or more open woundso Yes How many open wounds do you currently haveo 1 Approximately how long have you had your woundso 3 weeks How have you been treating your wound(s) until nowo mupirocin Has your wound(s) ever healed and then re-openedo No Have you had any lab work done in the past montho No Have you tested positive for an antibiotic resistant organism (MRSA, VRE)o No Have you tested positive for osteomyelitis (bone infection)o No Have you had any tests for circulation on your legso No Constitutional Symptoms (General Health) Complaints and Symptoms: Negative for: Fever; Chills Eyes Medical History: Negative for: Cataracts; Glaucoma; Optic Neuritis Ear/Nose/Mouth/Throat Medical History: Negative for: Chronic sinus problems/congestion; Middle ear problems Hematologic/Lymphatic Medical History: Negative for: Anemia; Hemophilia; Human Immunodeficiency Virus; Lymphedema; Sickle Cell Disease Respiratory Complaints and Symptoms: No Complaints or Symptoms Medical History: Negative for: Aspiration; Asthma; Chronic Obstructive Pulmonary Disease (COPD); Pneumothorax; Sleep Apnea; Tuberculosis Cardiovascular Complaints and Symptoms: No Complaints or Symptoms Medical History: Positive for: Arrhythmia - a fib; Hypertension Negative for: Angina; Congestive Heart Failure; Coronary Artery Disease; Deep Vein Thrombosis; Hypotension;  Myocardial Mato, Sheri J. ([id number]) Infarction; Peripheral Arterial Disease; Peripheral Venous Disease; Phlebitis; Vasculitis Gastrointestinal Medical History: Negative for: Cirrhosis ; Colitis; Crohnos; Hepatitis A; Hepatitis B; Hepatitis C Endocrine Medical History: Positive for: Type II Diabetes Treated with: Oral agents Blood sugar tested every day: No Immunological Medical History: Negative for: Lupus Erythematosus; Raynaudos; Scleroderma Integumentary (Skin) Medical History: Negative for: History of Burn; History of pressure wounds Musculoskeletal Medical History: Positive for: Osteoarthritis Negative for: Gout; Rheumatoid Arthritis; Osteomyelitis Neurologic Medical History: Negative for: Dementia; Neuropathy Oncologic Medical History: Negative for: Received Chemotherapy; Received Radiation Psychiatric Complaints and Symptoms: No Complaints or Symptoms Immunizations Pneumococcal Vaccine: Received Pneumococcal Vaccination: Yes Immunization Notes: up to date Implantable Devices Family and Social History Cancer: Yes - Siblings; Diabetes: Yes - Father; Heart Disease: Yes - Mother; Hereditary Spherocytosis: No; Hypertension: Yes - Mother; Kidney Disease: No; Lung Disease: No; Seizures: No; Stroke: No; Thyroid Problems: No; Tuberculosis: No; Former smoker; Marital Status - Single; Alcohol Use: Never; Drug Use: No History; Caffeine Use: Moderate; Financial BETTY, BROOKS ([id number]) Concerns: No; Food, Clothing or Shelter Needs: No; Support System Lacking: No; Transportation Concerns: No; Advanced Directives: No; Patient does not want information on Advanced Directives Physician Affirmation I have reviewed and agree with the above information. Electronic Signature(s) Signed: 04/05/2017 4:59:35 PM By: Roger Shelter Signed: 04/06/2017 12:14:27 AM By: Worthy Keeler PA-C Entered By: Worthy Keeler on 04/05/2017 12:52:24 Tesfaye, [name]  ([id number]) -------------------------------------------------------------------------------- SuperBill Details Patient Name: KERRINGTON, GREENHALGH. Date of Service: 04/05/2017 Medical Record Number: [id number] Patient Account Number: [id number] Date of Birth/Sex: 1950-11-22 (67 y.o. Male) Treating RN: Roger Shelter Primary Care Provider: Benita Stabile Other Clinician: Referring Provider: Benita Stabile Treating Provider/Extender: Melburn Hake, Beautifull Cisar Weeks in Treatment: 3 Diagnosis Coding ICD-10 Codes Code Description E11.621 Type 2 diabetes mellitus with foot ulcer L97.512 Non-pressure chronic ulcer of other part of right foot with fat layer exposed I10 Essential (primary) hypertension I48.2 Chronic atrial fibrillation Facility Procedures CPT4 Code: 76546503 Description: 11042 - DEB SUBQ TISSUE 20 SQ CM/< ICD-10 Diagnosis Description L97.512 Non-pressure chronic ulcer of other part of right foot with fa Modifier: t layer exposed Quantity: 1 Physician Procedures CPT4 Code: 5465681 Description: 99214 - WC PHYS LEVEL 4 - EST PT ICD-10 Diagnosis Description E11.621 Type 2 diabetes mellitus with foot ulcer L97.512 Non-pressure chronic ulcer of other part of right foot with fa I10 Essential (primary) hypertension I48.2 Chronic  atrial  fibrillation Modifier: 25 t layer exposed Quantity: 1 CPT4 Code: 2091068 Description: 16619 - WC PHYS SUBQ TISS 20 SQ CM ICD-10 Diagnosis Description L97.512 Non-pressure chronic ulcer of other part of right foot with fa Modifier: t layer exposed Quantity: 1 Electronic Signature(s) Signed: 04/06/2017 12:14:27 AM By: Worthy Keeler PA-C Entered By: Worthy Keeler on 04/05/2017 12:55:55

## 2017-04-07 LAB — AEROBIC CULTURE  (SUPERFICIAL SPECIMEN)

## 2017-04-07 LAB — AEROBIC CULTURE W GRAM STAIN (SUPERFICIAL SPECIMEN)

## 2017-04-08 NOTE — Progress Notes (Signed)
LUCAN, RINER ([id number]) Visit Report for 04/05/2017 Arrival Information Details Patient Name: [name], [name]. Date of Service: 04/05/2017 10:15 AM Medical Record Number: [id number] Patient Account Number: [id number] Date of Birth/Sex: October 27, 1950 (67 y.o. Male) Treating RN: Montey Hora Primary Care Gilmer Kaminsky: Benita Stabile Other Clinician: Referring Banesa Tristan: Benita Stabile Treating Francia Verry/Extender: Melburn Hake, HOYT Weeks in Treatment: 3 Visit Information History Since Last Visit Added or deleted any medications: No Patient Arrived: Ambulatory Any new allergies or adverse reactions: No Arrival Time: 10:32 Had a fall or experienced change in No Accompanied By: girlfriend activities of daily living that may affect Transfer Assistance: None risk of falls: Patient Identification Verified: Yes Signs or symptoms of abuse/neglect since last visito No Secondary Verification Process Completed: Yes Hospitalized since last visit: No Patient Has Alerts: Yes Has Dressing in Place as Prescribed: Yes Patient Alerts: DMII Pain Present Now: No Electronic Signature(s) Signed: 04/05/2017 4:59:43 PM By: Montey Hora Entered By: Montey Hora on 04/05/2017 10:32:46 Strand, [name] ([id number]) -------------------------------------------------------------------------------- Encounter Discharge Information Details Patient Name: [name]. Date of Service: 04/05/2017 10:15 AM Medical Record Number: [id number] Patient Account Number: [id number] Date of Birth/Sex: 07/06/1950 (67 y.o. Male) Treating RN: Montey Hora Primary Care Katheline Brendlinger: Benita Stabile Other Clinician: Referring Felicia Bloomquist: Benita Stabile Treating Jameah Rouser/Extender: Melburn Hake, HOYT Weeks in Treatment: 3 Encounter Discharge Information Items Discharge Pain Level: 0 Discharge Condition: Stable Ambulatory Status: Ambulatory Discharge Destination: Home Transportation: Private Auto Accompanied By: girlfriend Schedule Follow-up  Appointment: Yes Medication Reconciliation completed and No provided to Patient/Care Milianna Ericsson: Provided on Clinical Summary of Care: 04/05/2017 Form Type Recipient Paper Patient WT Electronic Signature(s) Signed: 04/07/2017 10:32:15 AM By: Ruthine Dose Entered By: Ruthine Dose on 04/05/2017 11:47:40 Smoot, [name] ([id number]) -------------------------------------------------------------------------------- Lower Extremity Assessment Details Patient Name: [name]. Date of Service: 04/05/2017 10:15 AM Medical Record Number: [id number] Patient Account Number: [id number] Date of Birth/Sex: 06/20/1950 (67 y.o. Male) Treating RN: Montey Hora Primary Care Chrystine Frogge: Benita Stabile Other Clinician: Referring Lilybelle Mayeda: Benita Stabile Treating Jaiquan Temme/Extender: STONE III, HOYT Weeks in Treatment: 3 Vascular Assessment Pulses: Dorsalis Pedis Palpable: [Right:Yes] Posterior Tibial Extremity colors, hair growth, and conditions: Extremity Color: [Right:Normal] Hair Growth on Extremity: [Right:Yes] Temperature of Extremity: [Right:Warm] Capillary Refill: [Right:< 3 seconds] Electronic Signature(s) Signed: 04/05/2017 4:59:43 PM By: Montey Hora Entered By: Montey Hora on 04/05/2017 10:39:04 Stipe, [name] ([id number]) -------------------------------------------------------------------------------- Multi Wound Chart Details Patient Name: [name]. Date of Service: 04/05/2017 10:15 AM Medical Record Number: [id number] Patient Account Number: [id number] Date of Birth/Sex: 1950-05-18 (67 y.o. Male) Treating RN: Roger Shelter Primary Care Shabre Kreher: TATE, Sharlet Salina Other Clinician: Referring Wendelin Reader: Benita Stabile Treating Terion Hedman/Extender: STONE III, HOYT Weeks in Treatment: 3 Vital Signs Height(in): 73 Pulse(bpm): 92 Weight(lbs): 231 Blood Pressure(mmHg): 158/74 Body Mass Index(BMI): 30 Temperature(F): 98.3 Respiratory Rate 18 (breaths/min): Photos:  [N/A:N/A] Wound Location: Right Foot - Plantar N/A N/A Wounding Event: Gradually Appeared N/A N/A Primary Etiology: Diabetic Wound/Ulcer of the N/A N/A Lower Extremity Comorbid History: Arrhythmia, Hypertension, N/A N/A Type II Diabetes, Osteoarthritis Date Acquired: 02/15/2017 N/A N/A Weeks of Treatment: 3 N/A N/A Wound Status: Open N/A N/A Pending Amputation on Yes N/A N/A Presentation: Measurements L x W x D 0.4x0.2x0.8 N/A N/A (cm) Area (cm) : 0.063 N/A N/A Volume (cm) : 0.05 N/A N/A % Reduction in Area: 59.90% N/A N/A % Reduction in Volume: 60.30% N/A N/A Classification: Grade 2 N/A N/A Exudate Amount: Large N/A N/A Exudate Type: Purulent N/A N/A Exudate Color: yellow, brown, green N/A N/A Foul Odor After  Cleansing: Yes N/A N/A Odor Anticipated Due to No N/A N/A Product Use: Wound Margin: Flat and Intact N/A N/A Granulation Amount: Large (67-100%) N/A N/A Granulation Quality: Red N/A N/A Necrotic Amount: Small (1-33%) N/A N/A [name], [name] ([id number]) Exposed Structures: Fat Layer (Subcutaneous N/A N/A Tissue) Exposed: Yes Fascia: No Tendon: No Muscle: No Joint: No Bone: No Epithelialization: None N/A N/A Periwound Skin Texture: Callus: Yes N/A N/A Excoriation: No Induration: No Crepitus: No Rash: No Scarring: No Periwound Skin Moisture: Maceration: Yes N/A N/A Dry/Scaly: No Periwound Skin Color: Atrophie Blanche: No N/A N/A Cyanosis: No Ecchymosis: No Erythema: No Hemosiderin Staining: No Mottled: No Pallor: No Rubor: No Temperature: No Abnormality N/A N/A Tenderness on Palpation: Yes N/A N/A Wound Preparation: Ulcer Cleansing: N/A N/A Rinsed/Irrigated with Saline Topical Anesthetic Applied: Other: lidocaine 4% Treatment Notes Electronic Signature(s) Signed: 04/05/2017 4:59:35 PM By: Roger Shelter Entered By: Roger Shelter on 04/05/2017 11:27:09 [name], [name]  ([id number]) -------------------------------------------------------------------------------- Multi-Disciplinary Care Plan Details Patient Name: [name], [name]. Date of Service: 04/05/2017 10:15 AM Medical Record Number: [id number] Patient Account Number: [id number] Date of Birth/Sex: 03/13/1950 (67 y.o. Male) Treating RN: Roger Shelter Primary Care Natausha Jungwirth: TATE, Sharlet Salina Other Clinician: Referring Laylynn Campanella: Benita Stabile Treating Quincee Gittens/Extender: Melburn Hake, HOYT Weeks in Treatment: 3 Active Inactive ` Nutrition Nursing Diagnoses: Imbalanced nutrition Impaired glucose control: actual or potential Potential for alteratiion in Nutrition/Potential for imbalanced nutrition Goals: Patient/caregiver agrees to and verbalizes understanding of need to use nutritional supplements and/or vitamins as prescribed Date Initiated: 03/09/2017 Target Resolution Date: 05/29/2017 Goal Status: Active Patient/caregiver will maintain therapeutic glucose control Date Initiated: 03/09/2017 Target Resolution Date: 05/29/2017 Goal Status: Active Interventions: Assess patient nutrition upon admission and as needed per policy Provide education on elevated blood sugars and impact on wound healing Provide education on nutrition Notes: ` Orientation to the Wound Care Program Nursing Diagnoses: Knowledge deficit related to the wound healing center program Goals: Patient/caregiver will verbalize understanding of the [facility] Date Initiated: 03/09/2017 Target Resolution Date: 03/27/2017 Goal Status: Active Interventions: Provide education on orientation to the wound center Notes: ` Pain, Acute or Chronic Nursing Diagnoses: [name], [name] ([id number]) Pain, acute or chronic: actual or potential Potential alteration in comfort, pain Goals: Patient/caregiver will verbalize adequate pain control between visits Date Initiated: 03/09/2017 Target Resolution Date: 05/29/2017 Goal Status:  Active Interventions: Complete pain assessment as per visit requirements Notes: ` Pressure Nursing Diagnoses: Knowledge deficit related to causes and risk factors for pressure ulcer development Knowledge deficit related to management of pressures ulcers Potential for impaired tissue integrity related to pressure, friction, moisture, and shear Goals: Patient will remain free from development of additional pressure ulcers Date Initiated: 03/09/2017 Target Resolution Date: 06/26/2017 Goal Status: Active Interventions: Assess offloading mechanisms upon admission and as needed Notes: ` Wound/Skin Impairment Nursing Diagnoses: Impaired tissue integrity Knowledge deficit related to ulceration/compromised skin integrity Goals: Ulcer/skin breakdown will have a volume reduction of 80% by week 12 Date Initiated: 03/09/2017 Target Resolution Date: 06/19/2017 Goal Status: Active Interventions: Assess ulceration(s) every visit Notes: Electronic Signature(s) Signed: 04/05/2017 4:59:35 PM By: Roger Shelter Entered By: Roger Shelter on 04/05/2017 11:26:58 [name], [name] ([id number]) -------------------------------------------------------------------------------- Pain Assessment Details Patient Name: [name]. Date of Service: 04/05/2017 10:15 AM Medical Record Number: [id number] Patient Account Number: [id number] Date of Birth/Sex: 26-Aug-1950 (67 y.o. Male) Treating RN: Montey Hora Primary Care Mercedes Valeriano: Benita Stabile Other Clinician: Referring Shylie Polo: Benita Stabile Treating Reegan Mctighe/Extender: STONE III, HOYT Weeks in Treatment: 3 Active Problems Location of  Pain Severity and Description of Pain Patient Has Paino No Site Locations Pain Management and Medication Current Pain Management: Notes Topical or injectable lidocaine is offered to patient for acute pain when surgical debridement is performed. If needed, Patient is instructed to use over the counter pain medication for  the following 24-48 hours after debridement. Wound care MDs do not prescribed pain medications. Patient has chronic pain or uncontrolled pain. Patient has been instructed to make an appointment with their Primary Care Physician for pain management. Electronic Signature(s) Signed: 04/05/2017 4:59:43 PM By: Montey Hora Entered By: Montey Hora on 04/05/2017 10:32:54 [name], [name] ([id number]) -------------------------------------------------------------------------------- Patient/Caregiver Education Details Patient Name: [name] Date of Service: 04/05/2017 10:15 AM Medical Record Number: [id number] Patient Account Number: [id number] Date of Birth/Gender: 1950/12/30 (67 y.o. Male) Treating RN: Montey Hora Primary Care Physician: Benita Stabile Other Clinician: Referring Physician: Benita Stabile Treating Physician/Extender: Sharalyn Ink in Treatment: 3 Education Assessment Education Provided To: Patient and Caregiver Education Topics Provided Wound/Skin Impairment: Handouts: Other: wound care as ordered Methods: Demonstration, Explain/Verbal Responses: State content correctly Electronic Signature(s) Signed: 04/05/2017 4:59:43 PM By: Montey Hora Entered By: Montey Hora on 04/05/2017 11:47:36 [name], [name] ([id number]) -------------------------------------------------------------------------------- Wound Assessment Details Patient Name: [name]. Date of Service: 04/05/2017 10:15 AM Medical Record Number: [id number] Patient Account Number: [id number] Date of Birth/Sex: April 21, 1950 (67 y.o. Male) Treating RN: Montey Hora Primary Care Donalyn Schneeberger: Benita Stabile Other Clinician: Referring Lua Feng: Benita Stabile Treating Aarik Blank/Extender: STONE III, HOYT Weeks in Treatment: 3 Wound Status Wound Number: 1 Primary Diabetic Wound/Ulcer of the Lower Extremity Etiology: Wound Location: Right Foot - Plantar Wound Status: Open Wounding Event: Gradually  Appeared Comorbid Arrhythmia, Hypertension, Type II Diabetes, Date Acquired: 02/15/2017 History: Osteoarthritis Weeks Of Treatment: 3 Clustered Wound: No Pending Amputation On Presentation Photos Photo Uploaded By: Montey Hora on 04/05/2017 10:46:15 Wound Measurements Length: (cm) 0.4 Width: (cm) 0.2 Depth: (cm) 0.8 Area: (cm) 0.063 Volume: (cm) 0.05 % Reduction in Area: 59.9% % Reduction in Volume: 60.3% Epithelialization: None Tunneling: No Undermining: No Wound Description Classification: Grade 2 Wound Margin: Flat and Intact Exudate Amount: Large Exudate Type: Purulent Exudate Color: yellow, brown, green Foul Odor After Cleansing: Yes Due to Product Use: No Slough/Fibrino Yes Wound Bed Granulation Amount: Large (67-100%) Exposed Structure Granulation Quality: Red Fascia Exposed: No Necrotic Amount: Small (1-33%) Fat Layer (Subcutaneous Tissue) Exposed: Yes Necrotic Quality: Adherent Slough Tendon Exposed: No Muscle Exposed: No Joint Exposed: No Bone Exposed: No Periwound Skin Texture Arizmendi, [name] ([id number]) Texture Color No Abnormalities Noted: No No Abnormalities Noted: No Callus: Yes Atrophie Blanche: No Crepitus: No Cyanosis: No Excoriation: No Ecchymosis: No Induration: No Erythema: No Rash: No Hemosiderin Staining: No Scarring: No Mottled: No Pallor: No Moisture Rubor: No No Abnormalities Noted: No Dry / Scaly: No Temperature / Pain Maceration: Yes Temperature: No Abnormality Tenderness on Palpation: Yes Wound Preparation Ulcer Cleansing: Rinsed/Irrigated with Saline Topical Anesthetic Applied: Other: lidocaine 4%, Treatment Notes Wound #1 (Right, Plantar Foot) 1. Cleansed with: Clean wound with Normal Saline 2. Anesthetic Topical Lidocaine 4% cream to wound bed prior to debridement 4. Dressing Applied: Other dressing (specify in notes) 5. Secondary Dressing Applied Dry Gauze Foam Kerlix/Conform 7. Secured  with Tape Notes kerlix wrap, peg assist shoe for offloading, coban, drawtex Electronic Signature(s) Signed: 04/05/2017 4:59:43 PM By: Montey Hora Entered By: Montey Hora on 04/05/2017 10:38:51 [name], [name] ([id number]) -------------------------------------------------------------------------------- Vitals Details Patient Name: [name]. Date of Service: 04/05/2017 10:15 AM Medical Record  Number: [id number] Patient Account Number: [id number] Date of Birth/Sex: 1950-09-09 (67 y.o. Male) Treating RN: Montey Hora Primary Care Jentzen Minasyan: TATE, Sharlet Salina Other Clinician: Referring Manual Navarra: Benita Stabile Treating Purl Claytor/Extender: STONE III, HOYT Weeks in Treatment: 3 Vital Signs Time Taken: 10:32 Temperature (F): 98.3 Height (in): 73 Pulse (bpm): 92 Weight (lbs): 231 Respiratory Rate (breaths/min): 18 Body Mass Index (BMI): 30.5 Blood Pressure (mmHg): 158/74 Reference Range: 80 - 120 mg / dl Electronic Signature(s) Signed: 04/05/2017 4:59:43 PM By: Montey Hora Entered By: Montey Hora on 04/05/2017 10:33:11

## 2017-04-12 ENCOUNTER — Encounter: Payer: PPO | Admitting: Physician Assistant

## 2017-04-12 DIAGNOSIS — L97512 Non-pressure chronic ulcer of other part of right foot with fat layer exposed: Secondary | ICD-10-CM | POA: Diagnosis not present

## 2017-04-12 DIAGNOSIS — E11621 Type 2 diabetes mellitus with foot ulcer: Secondary | ICD-10-CM | POA: Diagnosis not present

## 2017-04-15 NOTE — Progress Notes (Signed)
GLYN, GERADS ([id number]) Visit Report for 04/12/2017 Arrival Information Details Patient Name: [name], [name]. Date of Service: 04/12/2017 10:30 AM Medical Record Number: [id number] Patient Account Number: [id number] Date of Birth/Sex: [birth year] (67 y.o. M) Treating RN: Ahmed Prima Primary Care Alfredia Desanctis: Benita Stabile Other Clinician: Referring Chastelyn Athens: Benita Stabile Treating Kenzly Rogoff/Extender: Melburn Hake, HOYT Weeks in Treatment: 4 Visit Information History Since Last Visit All ordered tests and consults were completed: No Patient Arrived: Ambulatory Added or deleted any medications: No Arrival Time: 10:30 Any new allergies or adverse reactions: No Accompanied By: girlfriend Had a fall or experienced change in No Transfer Assistance: None activities of daily living that may affect Patient Identification Verified: Yes risk of falls: Secondary Verification Process Completed: Yes Signs or symptoms of abuse/neglect since last visito No Patient Requires Transmission-Based No Hospitalized since last visit: No Precautions: Implantable device outside of the clinic excluding No Patient Has Alerts: Yes cellular tissue based products placed in the center Patient Alerts: DMII since last visit: Has Dressing in Place as Prescribed: Yes Pain Present Now: No Electronic Signature(s) Signed: 04/12/2017 4:28:49 PM By: Alric Quan Entered By: Alric Quan on 04/12/2017 10:30:46 [name], [name] ([id number]) -------------------------------------------------------------------------------- Encounter Discharge Information Details Patient Name: [name]. Date of Service: 04/12/2017 10:30 AM Medical Record Number: [id number] Patient Account Number: [id number] Date of Birth/Sex: Nov 29, 1950 (67 y.o. M) Treating RN: Montey Hora Primary Care Jahron Hunsinger: Benita Stabile Other Clinician: Referring Aianna Fahs: Benita Stabile Treating Norman Bier/Extender: Melburn Hake, HOYT Weeks in Treatment:  4 Encounter Discharge Information Items Discharge Pain Level: 0 Discharge Condition: Stable Ambulatory Status: Ambulatory Discharge Destination: Home Transportation: Private Auto Accompanied By: spouse Schedule Follow-up Appointment: Yes Medication Reconciliation completed and No provided to Patient/Care Quincie Haroon: Provided on Clinical Summary of Care: 04/12/2017 Form Type Recipient Paper Patient WT Electronic Signature(s) Signed: 04/14/2017 10:05:17 AM By: Ruthine Dose Entered By: Ruthine Dose on 04/12/2017 11:29:09 [name], [name] ([id number]) -------------------------------------------------------------------------------- Lower Extremity Assessment Details Patient Name: [name]. Date of Service: 04/12/2017 10:30 AM Medical Record Number: [id number] Patient Account Number: [id number] Date of Birth/Sex: [birth year] (67 y.o. M) Treating RN: Ahmed Prima Primary Care Supriya Beaston: Benita Stabile Other Clinician: Referring Zenon Leaf: TATE, Sharlet Salina Treating Aracelli Woloszyn/Extender: STONE III, HOYT Weeks in Treatment: 4 Vascular Assessment Pulses: Dorsalis Pedis Palpable: [Right:Yes] Posterior Tibial Extremity colors, hair growth, and conditions: Extremity Color: [Right:Normal] Temperature of Extremity: [Right:Warm] Capillary Refill: [Right:< 3 seconds] Toe Nail Assessment Left: Right: Thick: Yes Discolored: Yes Deformed: No Improper Length and Hygiene: No Electronic Signature(s) Signed: 04/12/2017 4:28:49 PM By: Alric Quan Entered By: Alric Quan on 04/12/2017 10:40:17 [name], [name] ([id number]) -------------------------------------------------------------------------------- Multi Wound Chart Details Patient Name: [name]. Date of Service: 04/12/2017 10:30 AM Medical Record Number: [id number] Patient Account Number: [id number] Date of Birth/Sex: 19-Feb-1950 (67 y.o. M) Treating RN: Roger Shelter Primary Care Gerad Cornelio: TATE, Sharlet Salina Other  Clinician: Referring Blakelynn Scheeler: TATE, Sharlet Salina Treating Akon Reinoso/Extender: STONE III, HOYT Weeks in Treatment: 4 Vital Signs Height(in): 73 Pulse(bpm): 79 Weight(lbs): 231 Blood Pressure(mmHg): 129/65 Body Mass Index(BMI): 30 Temperature(F): 97.7 Respiratory Rate 18 (breaths/min): Photos: [1:No Photos] [N/A:N/A] Wound Location: [1:Right Foot - Plantar] [N/A:N/A] Wounding Event: [1:Gradually Appeared] [N/A:N/A] Primary Etiology: [1:Diabetic Wound/Ulcer of the Lower Extremity] [N/A:N/A] Comorbid History: [1:Arrhythmia, Hypertension, Type II Diabetes, Osteoarthritis] [N/A:N/A] Date Acquired: [1:02/15/2017] [N/A:N/A] Weeks of Treatment: [1:4] [N/A:N/A] Wound Status: [1:Open] [N/A:N/A] Pending Amputation on [1:Yes] [N/A:N/A] Presentation: Measurements L x W x D [1:0.5x0.2x0.3] [N/A:N/A] (cm) Area (cm) : [1:0.079] [N/A:N/A] Volume (cm) : [1:0.024] [N/A:N/A] % Reduction in Area: [1:49.70%] [  N/A:N/A] % Reduction in Volume: [1:81.00%] [N/A:N/A] Starting Position 1 [1:12] (o'clock): Ending Position 1 [1:6] (o'clock): Maximum Distance 1 (cm): [1:0.2] Undermining: [1:Yes] [N/A:N/A] Classification: [1:Grade 2] [N/A:N/A] Exudate Amount: [1:Medium] [N/A:N/A] Exudate Type: [1:Serosanguineous] [N/A:N/A] Exudate Color: [1:red, brown] [N/A:N/A] Foul Odor After Cleansing: [1:Yes] [N/A:N/A] Odor Anticipated Due to [1:No] [N/A:N/A] Product Use: Wound Margin: [1:Flat and Intact] [N/A:N/A] Granulation Amount: [1:Medium (34-66%)] [N/A:N/A] Granulation Quality: [1:Red] [N/A:N/A] Necrotic Amount: [1:Medium (34-66%)] [N/A:N/A] Necrotic Tissue: [1:Eschar, Adherent Slough] [N/A:N/A] Exposed Structures: Fat Layer (Subcutaneous N/A N/A Tissue) Exposed: Yes Fascia: No Tendon: No Muscle: No Joint: No Bone: No Epithelialization: None N/A N/A Periwound Skin Texture: Callus: Yes N/A N/A Excoriation: No Induration: No Crepitus: No Rash: No Scarring: No Periwound Skin Moisture: Maceration:  No N/A N/A Dry/Scaly: No Periwound Skin Color: Atrophie Blanche: No N/A N/A Cyanosis: No Ecchymosis: No Erythema: No Hemosiderin Staining: No Mottled: No Pallor: No Rubor: No Temperature: No Abnormality N/A N/A Tenderness on Palpation: Yes N/A N/A Wound Preparation: Ulcer Cleansing: N/A N/A Rinsed/Irrigated with Saline Topical Anesthetic Applied: Other: lidocaine 4% Treatment Notes Electronic Signature(s) Signed: 04/12/2017 4:23:05 PM By: Roger Shelter Entered By: Roger Shelter on 04/12/2017 11:09:19 [name], [name] ([id number]) -------------------------------------------------------------------------------- [place] Details Patient Name: [name], [name]. Date of Service: 04/12/2017 10:30 AM Medical Record Number: [id number] Patient Account Number: [id number] Date of Birth/Sex: 1950/03/31 (67 y.o. M) Treating RN: Roger Shelter Primary Care Jodel Mayhall: Benita Stabile Other Clinician: Referring Eyoel Throgmorton: TATE, Sharlet Salina Treating Deliyah Muckle/Extender: Melburn Hake, HOYT Weeks in Treatment: 4 Active Inactive ` Nutrition Nursing Diagnoses: Imbalanced nutrition Impaired glucose control: actual or potential Potential for alteratiion in Nutrition/Potential for imbalanced nutrition Goals: Patient/caregiver agrees to and verbalizes understanding of need to use nutritional supplements and/or vitamins as prescribed Date Initiated: 03/09/2017 Target Resolution Date: 05/29/2017 Goal Status: Active Patient/caregiver will maintain therapeutic glucose control Date Initiated: 03/09/2017 Target Resolution Date: 05/29/2017 Goal Status: Active Interventions: Assess patient nutrition upon admission and as needed per policy Provide education on elevated blood sugars and impact on wound healing Provide education on nutrition Notes: ` Orientation to the Wound Care Program Nursing Diagnoses: Knowledge deficit related to the wound healing center program Goals: Patient/caregiver  will verbalize understanding of the [place] [facility] Date Initiated: 03/09/2017 Target Resolution Date: 03/27/2017 Goal Status: Active Interventions: Provide education on orientation to the wound center Notes: ` Pain, Acute or Chronic Nursing Diagnoses: [name], [name] ([id number]) Pain, acute or chronic: actual or potential Potential alteration in comfort, pain Goals: Patient/caregiver will verbalize adequate pain control between visits Date Initiated: 03/09/2017 Target Resolution Date: 05/29/2017 Goal Status: Active Interventions: Complete pain assessment as per visit requirements Notes: ` Pressure Nursing Diagnoses: Knowledge deficit related to causes and risk factors for pressure ulcer development Knowledge deficit related to management of pressures ulcers Potential for impaired tissue integrity related to pressure, friction, moisture, and shear Goals: Patient will remain free from development of additional pressure ulcers Date Initiated: 03/09/2017 Target Resolution Date: 06/26/2017 Goal Status: Active Interventions: Assess offloading mechanisms upon admission and as needed Notes: ` Wound/Skin Impairment Nursing Diagnoses: Impaired tissue integrity Knowledge deficit related to ulceration/compromised skin integrity Goals: Ulcer/skin breakdown will have a volume reduction of 80% by week 12 Date Initiated: 03/09/2017 Target Resolution Date: 06/19/2017 Goal Status: Active Interventions: Assess ulceration(s) every visit Notes: Electronic Signature(s) Signed: 04/12/2017 4:23:05 PM By: Roger Shelter Entered By: Roger Shelter on 04/12/2017 11:09:10 [name], [name] ([id number]) -------------------------------------------------------------------------------- Pain Assessment Details Patient Name: [name], [name]. Date of Service: 04/12/2017 10:30 AM Medical Record  Number: [id number] Patient Account Number: [id number] Date of Birth/Sex: February 04, 1950 (67 y.o.  M) Treating RN: Ahmed Prima Primary Care Hallie Ertl: Benita Stabile Other Clinician: Referring Yuliet Needs: Benita Stabile Treating Julicia Krieger/Extender: STONE III, HOYT Weeks in Treatment: 4 Active Problems Location of Pain Severity and Description of Pain Patient Has Paino No Site Locations Pain Management and Medication Current Pain Management: Electronic Signature(s) Signed: 04/12/2017 4:28:49 PM By: Alric Quan Entered By: Alric Quan on 04/12/2017 10:30:51 [name], [name] ([id number]) -------------------------------------------------------------------------------- Patient/Caregiver Education Details Patient Name: [name] Date of Service: 04/12/2017 10:30 AM Medical Record Number: [id number] Patient Account Number: [id number] Date of Birth/Gender: April 30, 1950 (67 y.o. M) Treating RN: Montey Hora Primary Care Physician: Benita Stabile Other Clinician: Referring Physician: Benita Stabile Treating Physician/Extender: Sharalyn Ink in Treatment: 4 Education Assessment Education Provided To: Patient Education Topics Provided Wound/Skin Impairment: Handouts: Other: wound care as ordered Methods: Demonstration, Explain/Verbal Responses: State content correctly Electronic Signature(s) Signed: 04/12/2017 4:46:08 PM By: Montey Hora Entered By: Montey Hora on 04/12/2017 11:27:11 [name], [name] ([id number]) -------------------------------------------------------------------------------- Wound Assessment Details Patient Name: [name]. Date of Service: 04/12/2017 10:30 AM Medical Record Number: [id number] Patient Account Number: [id number] Date of Birth/Sex: 05/29/1950 (67 y.o. M) Treating RN: Ahmed Prima Primary Care Jivan Symanski: Benita Stabile Other Clinician: Referring Makai Agostinelli: Benita Stabile Treating Ashaya Raftery/Extender: STONE III, HOYT Weeks in Treatment: 4 Wound Status Wound Number: 1 Primary Diabetic Wound/Ulcer of the Lower Extremity Etiology: Wound  Location: Right Foot - Plantar Wound Status: Open Wounding Event: Gradually Appeared Comorbid Arrhythmia, Hypertension, Type II Diabetes, Date Acquired: 02/15/2017 History: Osteoarthritis Weeks Of Treatment: 4 Clustered Wound: No Pending Amputation On Presentation Photos Photo Uploaded By: Alric Quan on 04/12/2017 16:09:22 Wound Measurements Length: (cm) 0.5 Width: (cm) 0.2 Depth: (cm) 0.3 Area: (cm) 0.079 Volume: (cm) 0.024 % Reduction in Area: 49.7% % Reduction in Volume: 81% Epithelialization: None Tunneling: No Undermining: Yes Starting Position (o'clock): 12 Ending Position (o'clock): 6 Maximum Distance: (cm) 0.2 Wound Description Classification: Grade 2 Wound Margin: Flat and Intact Exudate Amount: Medium Exudate Type: Serosanguineous Exudate Color: red, brown Foul Odor After Cleansing: Yes Due to Product Use: No Slough/Fibrino Yes Wound Bed Granulation Amount: Medium (34-66%) Exposed Structure Granulation Quality: Red Fascia Exposed: No Necrotic Amount: Medium (34-66%) Fat Layer (Subcutaneous Tissue) Exposed: Yes Necrotic Quality: Eschar, Adherent Slough Tendon Exposed: No Muscle Exposed: No [name], [name] ([id number]) Joint Exposed: No Bone Exposed: No Periwound Skin Texture Texture Color No Abnormalities Noted: No No Abnormalities Noted: No Callus: Yes Atrophie Blanche: No Crepitus: No Cyanosis: No Excoriation: No Ecchymosis: No Induration: No Erythema: No Rash: No Hemosiderin Staining: No Scarring: No Mottled: No Pallor: No Moisture Rubor: No No Abnormalities Noted: No Dry / Scaly: No Temperature / Pain Maceration: No Temperature: No Abnormality Tenderness on Palpation: Yes Wound Preparation Ulcer Cleansing: Rinsed/Irrigated with Saline Topical Anesthetic Applied: Other: lidocaine 4%, Treatment Notes Wound #1 (Right, Plantar Foot) 1. Cleansed with: Clean wound with Normal Saline 2. Anesthetic Topical Lidocaine 4% cream  to wound bed prior to debridement 4. Dressing Applied: Other dressing (specify in notes) 5. Secondary Dressing Applied Dry Gauze Kerlix/Conform Notes silvercel, kerlix wrap, peg assist shoe for offloading, coban, drawtex Electronic Signature(s) Signed: 04/12/2017 4:28:49 PM By: Alric Quan Entered By: Alric Quan on 04/12/2017 10:39:31 Sherrer, [name] ([id number]) -------------------------------------------------------------------------------- [place] Details Patient Name: [name]. Date of Service: 04/12/2017 10:30 AM Medical Record Number: [id number] Patient Account Number: [id number] Date of Birth/Sex: 08-02-1950 (67 y.o. M) Treating RN: Ahmed Prima Primary Care Kaiea Esselman:  TATE, DENNY Other Clinician: Referring Deneane Stifter: TATE, DENNY Treating Loyola Santino/Extender: STONE III, HOYT Weeks in Treatment: 4 Vital Signs Time Taken: 10:30 Temperature (F): 97.7 Height (in): 73 Pulse (bpm): 79 Weight (lbs): 231 Respiratory Rate (breaths/min): 18 Body Mass Index (BMI): 30.5 Blood Pressure (mmHg): 129/65 Reference Range: 80 - 120 mg / dl Electronic Signature(s) Signed: 04/12/2017 4:28:49 PM By: Alric Quan Entered By: Alric Quan on 04/12/2017 10:33:37

## 2017-04-15 NOTE — Progress Notes (Signed)
KRISTINE, TILEY ([id number]) Visit Report for 04/12/2017 Chief Complaint Document Details Patient Name: [name], [name]. Date of Service: 04/12/2017 10:30 AM Medical Record Number: [id number] Patient Account Number: [id number] Date of Birth/Sex: [birth year] (67 y.o. M) Treating RN: Roger Shelter Primary Care Provider: Benita Stabile Other Clinician: Referring Provider: Benita Stabile Treating Provider/Extender: Melburn Hake, HOYT Weeks in Treatment: 4 Information Obtained from: Patient Chief Complaint Right plantar foot ulcer Electronic Signature(s) Signed: 04/13/2017 12:16:15 AM By: Worthy Keeler PA-C Entered By: Worthy Keeler on 04/12/2017 11:03:31 Platz, [name] ([id number]) -------------------------------------------------------------------------------- Debridement Details Patient Name: [name] Date of Service: 04/12/2017 10:30 AM Medical Record Number: [id number] Patient Account Number: [id number] Date of Birth/Sex: [birth year] (67 y.o. M) Treating RN: Roger Shelter Primary Care Provider: Benita Stabile Other Clinician: Referring Provider: Benita Stabile Treating Provider/Extender: STONE III, HOYT Weeks in Treatment: 4 Debridement Performed for Wound #1 Right,Plantar Foot Assessment: Performed By: Physician STONE III, HOYT E., PA-C Debridement Type: Debridement Severity of Tissue Pre Fat layer exposed Debridement: Pre-procedure Verification/Time Yes - 11:10 Out Taken: Start Time: 11:10 Pain Control: Other : lidocaine 4% Total Area Debrided (L x W): 0.5 (cm) x 0.2 (cm) = 0.1 (cm) Tissue and other material Viable, Non-Viable, Callus, Subcutaneous, Skin: Dermis debrided: Level: Skin/Subcutaneous Tissue Debridement Description: Excisional Instrument: Curette Bleeding: None End Time: 11:11 Procedural Pain: 0 Post Procedural Pain: 0 Response to Treatment: Procedure was tolerated well Post Debridement Measurements of Total Wound Length: (cm) 0.5 Width: (cm)  0.2 Depth: (cm) 0.3 Volume: (cm) 0.024 Character of Wound/Ulcer Post Debridement: Stable Severity of Tissue Post Debridement: Fat layer exposed Post Procedure Diagnosis Same as Pre-procedure Electronic Signature(s) Signed: 04/12/2017 4:23:05 PM By: Roger Shelter Signed: 04/13/2017 12:16:15 AM By: Worthy Keeler PA-C Entered By: Roger Shelter on 04/12/2017 11:11:10 Sinkler, [name] ([id number]) -------------------------------------------------------------------------------- HPI Details Patient Name: [name] Date of Service: 04/12/2017 10:30 AM Medical Record Number: [id number] Patient Account Number: [id number] Date of Birth/Sex: [birth year] (67 y.o. M) Treating RN: Roger Shelter Primary Care Provider: Benita Stabile Other Clinician: Referring Provider: TATE, Sharlet [place] Treating Provider/Extender: STONE III, HOYT Weeks in Treatment: 4 History of Present Illness Associated Signs and Symptoms: Patient has a history of diabetes mellitus type II, hypertension, and atrial fibrillation. He also has a hammertoe deformity of the bilateral feet which seems to be causing pressure in the ball of his foot. HPI Description: 03/09/17 on evaluation today patient presents for his initial evaluation concerning an ulcer on the plantar aspect of his right foot which has been open he tells me for about three weeks. He has been seen at [facility] where they did perform debridement it appears according to a note on 02/22/17 where unfortunately it appears that his callous area began to break down into an ulcer after modifications have been added to his insulin. This had happened previously as well. However I do not have details of the severity of debridement at that point although it does not sound as if you the debridement was too significant based on what the patient is telling me that he still had a lot of callous following debridement. With that being said they were going to look into altering  his insoles to try to prevent further breakdown in the future. Nonetheless he has had foul odor discharge coming from the ulcer which is noted all the way back to that visit on [facility]. Patient states that this was concerning him more than anything else. He does have  diabetes although he described this as "borderline diabetes" he is on metoprolol however along with lisinopril. Patient is not having any issues with pain in regard to his right plantar foot. 03/16/17 on evaluation today patient appears to be doing much better in regard to his plantar foot ulcer. He actually tells me that he loves the peg assist offloading shoe and that he hasn't had any pain in the ulcer area from the callous since I worked on it last week. Overall he is extremely happy with how things have progressed. Likewise the wound bed has no slough noted there is no evidence of infection and it looks excellent. I did receive the results of his hemoglobin A1c which showed a value of 5.9 which was elevated but actually rather well. Subsequently I did also receive the x-ray of his foot which showed diffuse degenerative change but no underlying acute bony abnormality. 03/23/17 on evaluation today patient appears to be doing better in regard to his right plantar foot ulcer. He continues to show signs of improvement there's definitely not as much drainage at this point. He has been tolerating the dressing changes without complication he is not happy with the peg assist offloading shoe but at the same time I do believe that it is making good progress as far as offloading is concerned. I still believe he may need to talk to a surgeon about surgically correcting a hammertoe in order to avoid additional pressure to the site especially since he Artie has diabetic shoes and they do not seem to have prevented callous buildup in ulcer formation. 03/30/17 on evaluation today patient appears to be doing better in regard to his  right plantar foot ulcer. Continues to show signs of improvement which is good news. Unfortunately we were unable to get the appointment with the orthopedic, Dr. Doran Durand, whom I recommended for him due to patient having a balance at the practice. With that being said patient's foot ulcer does seem to be doing much better on evaluation today he still has some depth to the wound but overall we are seeing improvement and epithelialization week by week. Hopefully this is something that will close shortly. 04/05/17 on evaluation today patient unfortunately has what appears to be an area of the plantar surface of his ulcer where he had fluid collection where the callous grew over top of the wound bed. Unfortunately there was some pus like material noted during cleansing that we did sent for culture today. Hopefully this is not truly an infection is or does not appear to be any evidence of erythema surrounding and maybe this is just simply a small setback with a fluid collection that has caused this issue. Nonetheless we will see what that shows when we get the culture back. I am also going to have it completely antibiotic which I previously placed him on anyway which will hopefully prevent any true infection from setting in. 04/12/17 on evaluation today patient's ulcer on the plantar aspect of his right foot actually appears to be better then during last weeks evaluation. In general he has been tolerating the dressing changes in utilizing the offloading shoe which does seem to be beneficial for him. With that being said he still seems to get some pressure to the area just not nearly as much as he previously had noted. Again fortunately there is no significant pain. KURTIS, ANASTASIA ([id number]) Electronic Signature(s) Signed: 04/13/2017 12:16:15 AM By: Worthy Keeler PA-C Entered By: Worthy Keeler on 04/12/2017 23:32:02 [place],  [name]  ([id number]) -------------------------------------------------------------------------------- Physical Exam Details Patient Name: EVERITT, WENNER. Date of Service: 04/12/2017 10:30 AM Medical Record Number: [id number] Patient Account Number: [id number] Date of Birth/Sex: 1950-05-29 (67 y.o. M) Treating RN: Roger Shelter Primary Care Provider: Benita Stabile Other Clinician: Referring Provider: TATE, Sharlet Salina Treating Provider/Extender: STONE III, HOYT Weeks in Treatment: 4 Constitutional Well-nourished and well-hydrated in no acute distress. Respiratory normal breathing without difficulty. Psychiatric this patient is able to make decisions and demonstrates good insight into disease process. Alert and Oriented x 3. pleasant and cooperative. Notes Currently I did debride patient's ulceration on the plantar aspect of his foot on the right and post debridement the wound bed did appear to be much better I did remove slough/biofilm/subcutaneous tissue today. Overall in general I am very pleased with the fact that the patient seems to be making good progress yet again. Electronic Signature(s) Signed: 04/13/2017 12:16:15 AM By: Worthy Keeler PA-C Entered By: Worthy Keeler on 04/12/2017 23:33:12 Franzel, [name] ([id number]) -------------------------------------------------------------------------------- Physician Orders Details Patient Name: MYCHAEL, SMOCK. Date of Service: 04/12/2017 10:30 AM Medical Record Number: [id number] Patient Account Number: [id number] Date of Birth/Sex: [birth year] (67 y.o. M) Treating RN: Roger Shelter Primary Care Provider: Benita Stabile Other Clinician: Referring Provider: Benita Stabile Treating Provider/Extender: Melburn Hake, HOYT Weeks in Treatment: 4 Verbal / Phone Orders: No Diagnosis Coding ICD-10 Coding Code Description E11.621 Type 2 diabetes mellitus with foot ulcer L97.512 Non-pressure chronic ulcer of other part of right foot with fat layer exposed I10  Essential (primary) hypertension I48.2 Chronic atrial fibrillation Wound Cleansing Wound #1 Right,Plantar Foot o Clean wound with Normal Saline. Anesthetic (add to Medication List) Wound #1 Right,Plantar Foot o Topical Lidocaine 4% cream applied to wound bed prior to debridement (In Clinic Only). Skin Barriers/Peri-Wound Care Wound #1 Right,Plantar Foot o Skin Prep o Other: - silver nitrate to wound to stop bleeding Primary Wound Dressing Wound #1 Right,Plantar Foot o Other: - silvercell Secondary Dressing Wound #1 Right,Plantar Foot o Dry Gauze o Coban - conform o Foam - or felt for offloading Dressing Change Frequency Wound #1 Right,Plantar Foot o Change dressing every day. Follow-up Appointments Wound #1 Right,Plantar Foot o Return Appointment in 1 week. Edema Control Wound #1 Right,Plantar Foot [place], [place] ([id number]) o Elevate legs to the level of the heart and pump ankles as often as possible Off-Loading Wound #1 Right,Plantar Foot o Open toe surgical shoe with peg assist. Additional Orders / Instructions Wound #1 Right,Plantar Foot o Increase protein intake. Medications-please add to medication list. Wound #1 Right,Plantar Foot o P.O. Antibiotics - Take antibiotics as prescribed Electronic Signature(s) Signed: 04/12/2017 4:23:05 PM By: Roger Shelter Signed: 04/13/2017 12:16:15 AM By: Worthy Keeler PA-C Entered By: Roger Shelter on 04/12/2017 11:21:44 [place], [name] ([id number]) -------------------------------------------------------------------------------- Problem List Details Patient Name: JAEDIN, REGINA. Date of Service: 04/12/2017 10:30 AM Medical Record Number: [id number] Patient Account Number: [id number] Date of Birth/Sex: February 14, 1950 (67 y.o. M) Treating RN: Roger Shelter Primary Care Provider: Benita Stabile Other Clinician: Referring Provider: Benita Stabile Treating Provider/Extender: Melburn Hake, HOYT Weeks in  Treatment: 4 Active Problems ICD-10 Impacting Encounter Code Description Active Date Wound Healing Diagnosis E11.621 Type 2 diabetes mellitus with foot ulcer 03/09/2017 Yes L97.512 Non-pressure chronic ulcer of other part of right foot with fat 03/09/2017 Yes layer exposed Tat Momoli (primary) hypertension 03/09/2017 Yes I48.2 Chronic atrial fibrillation 03/09/2017 Yes Inactive Problems Resolved Problems Electronic Signature(s) Signed: 04/13/2017 12:16:15 AM By: Worthy Keeler PA-C Entered By: Joaquim Lai  III, Hoyt on 04/12/2017 11:03:17 Mcgonagle, LIONARDO HAZE ([id number]) -------------------------------------------------------------------------------- Progress Note Details Patient Name: ROMELLE, REILEY. Date of Service: 04/12/2017 10:30 AM Medical Record Number: [id number] Patient Account Number: [id number] Date of Birth/Sex: 1950-12-23 (67 y.o. M) Treating RN: Roger Shelter Primary Care Provider: Benita Stabile Other Clinician: Referring Provider: Benita Stabile Treating Provider/Extender: Melburn Hake, HOYT Weeks in Treatment: 4 Subjective Chief Complaint Information obtained from Patient Right plantar foot ulcer History of Present Illness (HPI) The following HPI elements were documented for the patient's wound: Associated Signs and Symptoms: Patient has a history of diabetes mellitus type II, hypertension, and atrial fibrillation. He also has a hammertoe deformity of the bilateral feet which seems to be causing pressure in the ball of his foot. 03/09/17 on evaluation today patient presents for his initial evaluation concerning an ulcer on the plantar aspect of his right foot which has been open he tells me for about three weeks. He has been seen at [facility] where they did perform debridement it appears according to a note on 02/22/17 where unfortunately it appears that his callous area began to break down into an ulcer after modifications have been added to his insulin. This had happened  previously as well. However I do not have details of the severity of debridement at that point although it does not sound as if you the debridement was too significant based on what the patient is telling me that he still had a lot of callous following debridement. With that being said they were going to look into altering his insoles to try to prevent further breakdown in the future. Nonetheless he has had foul odor discharge coming from the ulcer which is noted all the way back to that visit [facility]. Patient states that this was concerning him more than anything else. He does have diabetes although he described this as "borderline diabetes" he is on metoprolol however along with lisinopril. Patient is not having any issues with pain in regard to his right plantar foot. 03/16/17 on evaluation today patient appears to be doing much better in regard to his plantar foot ulcer. He actually tells me that he loves the peg assist offloading shoe and that he hasn't had any pain in the ulcer area from the callous since I worked on it last week. Overall he is extremely happy with how things have progressed. Likewise the wound bed has no slough noted there is no evidence of infection and it looks excellent. I did receive the results of his hemoglobin A1c which showed a value of 5.9 which was elevated but actually rather well. Subsequently I did also receive the x-ray of his foot which showed diffuse degenerative change but no underlying acute bony abnormality. 03/23/17 on evaluation today patient appears to be doing better in regard to his right plantar foot ulcer. He continues to show signs of improvement there's definitely not as much drainage at this point. He has been tolerating the dressing changes without complication he is not happy with the peg assist offloading shoe but at the same time I do believe that it is making good progress as far as offloading is concerned. I still  believe he may need to talk to a surgeon about surgically correcting a hammertoe in order to avoid additional pressure to the site especially since he Artie has diabetic shoes and they do not seem to have prevented callous buildup in ulcer formation. 03/30/17 on evaluation today patient appears to be doing  better in regard to his right plantar foot ulcer. Continues to show signs of improvement which is good news. Unfortunately we were unable to get the appointment with the orthopedic, Dr. Doran Durand, whom I recommended for him due to patient having a balance at the practice. With that being said patient's foot ulcer does seem to be doing much better on evaluation today he still has some depth to the wound but overall we are seeing improvement and epithelialization week by week. Hopefully this is something that will close shortly. 04/05/17 on evaluation today patient unfortunately has what appears to be an area of the plantar surface of his ulcer where he had fluid collection where the callous grew over top of the wound bed. Unfortunately there was some pus like material noted during cleansing that we did sent for culture today. Hopefully this is not truly an infection is or does not appear to be any REINHOLD, RICKEY. ([id number]) evidence of erythema surrounding and maybe this is just simply a small setback with a fluid collection that has caused this issue. Nonetheless we will see what that shows when we get the culture back. I am also going to have it completely antibiotic which I previously placed him on anyway which will hopefully prevent any true infection from setting in. 04/12/17 on evaluation today patient's ulcer on the plantar aspect of his right foot actually appears to be better then during last weeks evaluation. In general he has been tolerating the dressing changes in utilizing the offloading shoe which does seem to be beneficial for him. With that being said he still seems to get some pressure  to the area just not nearly as much as he previously had noted. Again fortunately there is no significant pain. Patient History Information obtained from Patient. Family History Cancer - Siblings, Diabetes - Father, Heart Disease - Mother, Hypertension - Mother, No family history of Hereditary Spherocytosis, Kidney Disease, Lung Disease, Seizures, Stroke, Thyroid Problems, Tuberculosis. Social History Former smoker, Marital Status - Single, Alcohol Use - Never, Drug Use - No History, Caffeine Use - Moderate. Review of Systems (ROS) Constitutional Symptoms (General Health) Denies complaints or symptoms of Fever, Chills. Respiratory The patient has no complaints or symptoms. Cardiovascular The patient has no complaints or symptoms. Psychiatric The patient has no complaints or symptoms. Objective Constitutional Well-nourished and well-hydrated in no acute distress. Vitals Time Taken: 10:30 AM, Height: 73 in, Weight: 231 lbs, BMI: 30.5, Temperature: 97.7 F, Pulse: 79 bpm, Respiratory Rate: 18 breaths/min, Blood Pressure: 129/65 mmHg. Respiratory normal breathing without difficulty. Psychiatric this patient is able to make decisions and demonstrates good insight into disease process. Alert and Oriented x 3. pleasant and cooperative. General Notes: Currently I did debride patient's ulceration on the plantar aspect of his foot on the right and post debridement the wound bed did appear to be much better I did remove slough/biofilm/subcutaneous tissue today. Overall in general I am very pleased with the fact that the patient seems to be making good progress yet again. QUINCY, BOY ([id number]) Integumentary (Hair, Skin) Wound #1 status is Open. Original cause of wound was Gradually Appeared. The wound is located on the Wallace. The wound measures 0.5cm length x 0.2cm width x 0.3cm depth; 0.079cm^2 area and 0.024cm^3 volume. There is Fat Layer (Subcutaneous Tissue) Exposed  exposed. There is no tunneling noted, however, there is undermining starting at 12:00 and ending at 6:00 with a maximum distance of 0.2cm. There is a medium amount of serosanguineous drainage noted.  Foul odor after cleansing was noted. The wound margin is flat and intact. There is medium (34-66%) red granulation within the wound bed. There is a medium (34-66%) amount of necrotic tissue within the wound bed including Eschar and Adherent Slough. The periwound skin appearance exhibited: Callus. The periwound skin appearance did not exhibit: Crepitus, Excoriation, Induration, Rash, Scarring, Dry/Scaly, Maceration, Atrophie Blanche, Cyanosis, Ecchymosis, Hemosiderin Staining, Mottled, Pallor, Rubor, Erythema. Periwound temperature was noted as No Abnormality. The periwound has tenderness on palpation. Assessment Active Problems ICD-10 E11.621 - Type 2 diabetes mellitus with foot ulcer L97.512 - Non-pressure chronic ulcer of other part of right foot with fat layer exposed I10 - Essential (primary) hypertension I48.2 - Chronic atrial fibrillation Procedures Wound #1 Pre-procedure diagnosis of Wound #1 is a Diabetic Wound/Ulcer of the Lower Extremity located on the [place] .Severity of Tissue Pre Debridement is: Fat layer exposed. There was a Excisional Skin/Subcutaneous Tissue Debridement with a total area of 0.1 sq cm performed by STONE III, HOYT E., PA-C. With the following instrument(s): Curette. to remove Viable and Non-Viable tissue/material Material removed includes Callus, Subcutaneous Tissue, and Skin: Dermis after achieving pain control using Other (lidocaine 4%). No specimens were taken. A time out was conducted at 11:10, prior to the start of the procedure. There was no bleeding. The procedure was tolerated well with a pain level of 0 throughout and a pain level of 0 following the procedure. Post Debridement Measurements: 0.5cm length x 0.2cm width x 0.3cm depth; 0.024cm^3  volume. Character of Wound/Ulcer Post Debridement is stable. Severity of Tissue Post Debridement is: Fat layer exposed. Post procedure Diagnosis Wound #1: Same as Pre-Procedure Plan Wound Cleansing: Wound #1 Right,Plantar Foot: Clean wound with Normal Saline. Anesthetic (add to Medication List): Wound #1 Right,Plantar Foot: Topical Lidocaine 4% cream applied to wound bed prior to debridement (In Clinic Only). Skin Barriers/Peri-Wound Care: KEIGEN, CADDELL ([id number]) Wound #1 Right,Plantar Foot: Skin Prep Other: - silver nitrate to wound to stop bleeding Primary Wound Dressing: Wound #1 Right,Plantar Foot: Other: - silvercell Secondary Dressing: Wound #1 Right,Plantar Foot: Dry Gauze Coban - conform Foam - or felt for offloading Dressing Change Frequency: Wound #1 Right,Plantar Foot: Change dressing every day. Follow-up Appointments: Wound #1 Right,Plantar Foot: Return Appointment in 1 week. Edema Control: Wound #1 Right,Plantar Foot: Elevate legs to the level of the heart and pump ankles as often as possible Off-Loading: Wound #1 Right,Plantar Foot: Open toe surgical shoe with peg assist. Additional Orders / Instructions: Wound #1 Right,Plantar Foot: Increase protein intake. Medications-please add to medication list.: Wound #1 Right,Plantar Foot: P.O. Antibiotics - Take antibiotics as prescribed I am going to suggest at this point that we continue with the Current wound care measures since he seems to be doing well. Patient is in agreement with plan. We will subsequently see him for reevaluation in one weeks time to see where things stand in regard to his wound. Please see above for specific wound care orders. We will see patient for re-evaluation in 1 week(s) here in the clinic. If anything worsens or changes patient will contact our office for additional recommendations. Electronic Signature(s) Signed: 04/13/2017 12:16:15 AM By: Worthy Keeler PA-C Entered By:  Worthy Keeler on 04/12/2017 23:33:57 [place], [name] ([id number]) -------------------------------------------------------------------------------- ROS/PFSH Details Patient Name: INRI, SOBIESKI. Date of Service: 04/12/2017 10:30 AM Medical Record Number: [id number] Patient Account Number: [id number] Date of Birth/Sex: [birth year] (67 y.o. M) Treating RN: Roger Shelter Primary Care Provider: Benita Stabile Other Clinician: Referring Provider:  TATE, DENNY Treating Provider/Extender: STONE III, HOYT Weeks in Treatment: 4 Information Obtained From Patient Wound History Do you currently have one or more open woundso Yes How many open wounds do you currently haveo 1 Approximately how long have you had your woundso 3 weeks How have you been treating your wound(s) until nowo mupirocin Has your wound(s) ever healed and then re-openedo No Have you had any lab work done in the past montho No Have you tested positive for an antibiotic resistant organism (MRSA, VRE)o No Have you tested positive for osteomyelitis (bone infection)o No Have you had any tests for circulation on your legso No Constitutional Symptoms (General Health) Complaints and Symptoms: Negative for: Fever; Chills Eyes Medical History: Negative for: Cataracts; Glaucoma; Optic Neuritis Ear/Nose/Mouth/Throat Medical History: Negative for: Chronic sinus problems/congestion; Middle ear problems Hematologic/Lymphatic Medical History: Negative for: Anemia; Hemophilia; Human Immunodeficiency Virus; Lymphedema; Sickle Cell Disease Respiratory Complaints and Symptoms: No Complaints or Symptoms Medical History: Negative for: Aspiration; Asthma; Chronic Obstructive Pulmonary Disease (COPD); Pneumothorax; Sleep Apnea; Tuberculosis Cardiovascular Complaints and Symptoms: No Complaints or Symptoms Medical History: Positive for: Arrhythmia - a fib; Hypertension Negative for: Angina; Congestive Heart Failure; Coronary Artery Disease;  Deep Vein Thrombosis; Hypotension; Myocardial Mcgriff, Qamar J. ([id number]) Infarction; Peripheral Arterial Disease; Peripheral Venous Disease; Phlebitis; Vasculitis Gastrointestinal Medical History: Negative for: Cirrhosis ; Colitis; Crohnos; Hepatitis A; Hepatitis B; Hepatitis C Endocrine Medical History: Positive for: Type II Diabetes Treated with: Oral agents Blood sugar tested every day: No Immunological Medical History: Negative for: Lupus Erythematosus; Raynaudos; Scleroderma Integumentary (Skin) Medical History: Negative for: History of Burn; History of pressure wounds Musculoskeletal Medical History: Positive for: Osteoarthritis Negative for: Gout; Rheumatoid Arthritis; Osteomyelitis Neurologic Medical History: Negative for: Dementia; Neuropathy Oncologic Medical History: Negative for: Received Chemotherapy; Received Radiation Psychiatric Complaints and Symptoms: No Complaints or Symptoms Immunizations Pneumococcal Vaccine: Received Pneumococcal Vaccination: Yes Immunization Notes: up to date Implantable Devices Family and Social History Cancer: Yes - Siblings; Diabetes: Yes - Father; Heart Disease: Yes - Mother; Hereditary Spherocytosis: No; Hypertension: Yes - Mother; Kidney Disease: No; Lung Disease: No; Seizures: No; Stroke: No; Thyroid Problems: No; Tuberculosis: No; Former smoker; Marital Status - Single; Alcohol Use: Never; Drug Use: No History; Caffeine Use: Moderate; Financial HELIOS, KOHLMANN ([id number]) Concerns: No; Food, Clothing or Shelter Needs: No; Support System Lacking: No; Transportation Concerns: No; Advanced Directives: No; Patient does not want information on Advanced Directives Physician Affirmation I have reviewed and agree with the above information. Electronic Signature(s) Signed: 04/13/2017 12:16:15 AM By: Worthy Keeler PA-C Signed: 04/14/2017 7:46:43 AM By: Roger Shelter Entered By: Worthy Keeler on 04/12/2017 23:32:26 [place],  [name] ([id number]) -------------------------------------------------------------------------------- SuperBill Details Patient Name: DONTRAE, MORINI. Date of Service: 04/12/2017 Medical Record Number: [id number] Patient Account Number: [id number] Date of Birth/Sex: April 20, 1950 (67 y.o. M) Treating RN: Roger Shelter Primary Care Provider: Benita Stabile Other Clinician: Referring Provider: Benita Stabile Treating Provider/Extender: Melburn Hake, HOYT Weeks in Treatment: 4 Diagnosis Coding ICD-10 Codes Code Description E11.621 Type 2 diabetes mellitus with foot ulcer L97.512 Non-pressure chronic ulcer of other part of right foot with fat layer exposed I10 Essential (primary) hypertension I48.2 Chronic atrial fibrillation Facility Procedures CPT4 Code: [id number] Description: 11042 - DEB SUBQ TISSUE 20 SQ CM/< ICD-10 Diagnosis Description L97.512 Non-pressure chronic ulcer of other part of right foot with fa Modifier: t layer exposed Quantity: 1 Physician Procedures CPT4 Code: [id number] Description: 11042 - WC PHYS SUBQ TISS 20 SQ CM ICD-10 Diagnosis Description L97.512 Non-pressure chronic ulcer of  other part of right foot with fa Modifier: t layer exposed Quantity: 1 Electronic Signature(s) Signed: 04/13/2017 12:16:15 AM By: Worthy Keeler PA-C Entered By: Worthy Keeler on 04/12/2017 23:34:31

## 2017-04-16 DIAGNOSIS — E119 Type 2 diabetes mellitus without complications: Secondary | ICD-10-CM | POA: Diagnosis not present

## 2017-04-16 DIAGNOSIS — E785 Hyperlipidemia, unspecified: Secondary | ICD-10-CM | POA: Diagnosis not present

## 2017-04-16 DIAGNOSIS — I10 Essential (primary) hypertension: Secondary | ICD-10-CM | POA: Diagnosis not present

## 2017-04-16 DIAGNOSIS — E039 Hypothyroidism, unspecified: Secondary | ICD-10-CM | POA: Diagnosis not present

## 2017-04-19 ENCOUNTER — Encounter: Payer: PPO | Attending: Physician Assistant | Admitting: Physician Assistant

## 2017-04-19 DIAGNOSIS — I10 Essential (primary) hypertension: Secondary | ICD-10-CM | POA: Insufficient documentation

## 2017-04-19 DIAGNOSIS — E1151 Type 2 diabetes mellitus with diabetic peripheral angiopathy without gangrene: Secondary | ICD-10-CM | POA: Insufficient documentation

## 2017-04-19 DIAGNOSIS — I482 Chronic atrial fibrillation: Secondary | ICD-10-CM | POA: Insufficient documentation

## 2017-04-19 DIAGNOSIS — E11621 Type 2 diabetes mellitus with foot ulcer: Secondary | ICD-10-CM | POA: Diagnosis not present

## 2017-04-19 DIAGNOSIS — L97512 Non-pressure chronic ulcer of other part of right foot with fat layer exposed: Secondary | ICD-10-CM | POA: Insufficient documentation

## 2017-04-19 DIAGNOSIS — L84 Corns and callosities: Secondary | ICD-10-CM | POA: Diagnosis not present

## 2017-04-21 NOTE — Progress Notes (Signed)
RODMAN, RECUPERO ([id number]) Visit Report for 04/19/2017 Chief Complaint Document Details Patient Name: [name], [name]. Date of Service: 04/19/2017 11:00 AM Medical Record Number: [id number] Patient Account Number: [id number] Date of Birth/Sex: 1950/03/16 (67 y.o. M) Treating RN: Roger Shelter Primary Care Provider: Benita Stabile Other Clinician: Referring Provider: Benita Stabile Treating Provider/Extender: Melburn Hake,  Weeks in Treatment: 5 Information Obtained from: Patient Chief Complaint Right plantar foot ulcer Electronic Signature(s) Signed: 04/20/2017 8:40:52 AM By: Worthy Keeler PA-C Entered By: Worthy Keeler on 04/19/2017 11:25:51 [name], [name] ([id number]) -------------------------------------------------------------------------------- HPI Details Patient Name: [name]. Date of Service: 04/19/2017 11:00 AM Medical Record Number: [id number] Patient Account Number: [id number] Date of Birth/Sex: 10/31/1950 (67 y.o. M) Treating RN: Roger Shelter Primary Care Provider: Benita Stabile Other Clinician: Referring Provider: TATE, Sharlet Salina Treating Provider/Extender: STONE III,  Weeks in Treatment: 5 History of Present Illness Associated Signs and Symptoms: Patient has a history of diabetes mellitus type II, hypertension, and atrial fibrillation. He also has a hammertoe deformity of the bilateral feet which seems to be causing pressure in the ball of his foot. HPI Description: 03/09/17 on evaluation today patient presents for his initial evaluation concerning an ulcer on the plantar aspect of his right foot which has been open he tells me for about three weeks. He has been seen at [facility] where they did perform debridement it appears according to a note on 02/22/17 where unfortunately it appears that his callous area began to break down into an ulcer after modifications have been added to his insulin. This had happened previously as well. However I do not have  details of the severity of debridement at that point although it does not sound as if you the debridement was too significant based on what the patient is telling me that he still had a lot of callous following debridement. With that being said they were going to look into altering his insoles to try to prevent further breakdown in the future. Nonetheless he has had foul odor discharge coming from the ulcer which is noted all the way back to that visit [facility]. Patient states that this was concerning him more than anything else. He does have diabetes although he described this as "borderline diabetes" he is on metoprolol however along with lisinopril. Patient is not having any issues with pain in regard to his right plantar foot. 03/16/17 on evaluation today patient appears to be doing much better in regard to his plantar foot ulcer. He actually tells me that he loves the peg assist offloading shoe and that he hasn't had any pain in the ulcer area from the callous since I worked on it last week. Overall he is extremely happy with how things have progressed. Likewise the wound bed has no slough noted there is no evidence of infection and it looks excellent. I did receive the results of his hemoglobin A1c which showed a value of 5.9 which was elevated but actually rather well. Subsequently I did also receive the x-ray of his foot which showed diffuse degenerative change but no underlying acute bony abnormality. 03/23/17 on evaluation today patient appears to be doing better in regard to his right plantar foot ulcer. He continues to show signs of improvement there's definitely not as much drainage at this point. He has been tolerating the dressing changes without complication he is not happy with the peg assist offloading shoe but at the same time I do believe that it  is making good progress as far as offloading is concerned. I still believe he may need to talk to a surgeon about  surgically correcting a hammertoe in order to avoid additional pressure to the site especially since he Artie has diabetic shoes and they do not seem to have prevented callous buildup in ulcer formation. 03/30/17 on evaluation today patient appears to be doing better in regard to his right plantar foot ulcer. Continues to show signs of improvement which is good news. Unfortunately we were unable to get the appointment with the orthopedic, Dr. Doran Durand, whom I recommended for him due to patient having a balance at the practice. With that being said patient's foot ulcer does seem to be doing much better on evaluation today he still has some depth to the wound but overall we are seeing improvement and epithelialization week by week. Hopefully this is something that will close shortly. 04/05/17 on evaluation today patient unfortunately has what appears to be an area of the plantar surface of his ulcer where he had fluid collection where the callous grew over top of the wound bed. Unfortunately there was some pus like material noted during cleansing that we did sent for culture today. Hopefully this is not truly an infection is or does not appear to be any evidence of erythema surrounding and maybe this is just simply a small setback with a fluid collection that has caused this issue. Nonetheless we will see what that shows when we get the culture back. I am also going to have it completely antibiotic which I previously placed him on anyway which will hopefully prevent any true infection from setting in. 04/12/17 on evaluation today patient's ulcer on the plantar aspect of his right foot actually appears to be better then during last weeks evaluation. In general he has been tolerating the dressing changes in utilizing the offloading shoe which does seem to be beneficial for him. With that being said he still seems to get some pressure to the area just not nearly as much as he previously had noted. Again  fortunately there is no significant pain. STEDMAN, [place] ([id number]) 04/19/17 on evaluation today patient appears to be doing excellent in regard to his right foot ulcer. He has been tolerating the dressing changes without complication. There really has not been any drainage over the past few days he tells me. With that being said he seems to be doing excellent in my opinion at this point he does have some callous buildup. Electronic Signature(s) Signed: 04/20/2017 8:40:52 AM By: Worthy Keeler PA-C Entered By: Worthy Keeler on 04/19/2017 17:27:39 [name], [name] ([id number]) -------------------------------------------------------------------------------- Physical Exam Details Patient Name: DANFORD, TAT. Date of Service: 04/19/2017 11:00 AM Medical Record Number: [id number] Patient Account Number: [id number] Date of Birth/Sex: 12-04-1950 (67 y.o. M) Treating RN: Roger Shelter Primary Care Provider: Benita Stabile Other Clinician: Referring Provider: TATE, Sharlet Salina Treating Provider/Extender: STONE III,  Weeks in Treatment: 5 Constitutional Well-nourished and well-hydrated in no acute distress. Respiratory normal breathing without difficulty. clear to auscultation bilaterally. Cardiovascular regular rate and rhythm with normal S1, S2. Psychiatric this patient is able to make decisions and demonstrates good insight into disease process. Alert and Oriented x 3. pleasant and cooperative. Notes Currently it would appear that patients wound is actually doing much better today and in fact I really feel like this may be completely healed. After cleaning the wound it did not appear that there was any evidence of true opening although there was an  area that [place] I could've possibly debrided deeper I probably would've just cause more damage as I feel like this may actually be completely healed. For that reason I opted just to wait and reevaluate in one weeks time. Electronic  Signature(s) Signed: 04/20/2017 8:40:52 AM By: Worthy Keeler PA-C Entered By: Worthy Keeler on 04/19/2017 17:29:35 [name], [name] ([id number]) -------------------------------------------------------------------------------- Physician Orders Details Patient Name: KALA, GASSMANN. Date of Service: 04/19/2017 11:00 AM Medical Record Number: [id number] Patient Account Number: [id number] Date of Birth/Sex: 1950/05/06 (67 y.o. M) Treating RN: Roger Shelter Primary Care Provider: Benita Stabile Other Clinician: Referring Provider: Benita Stabile Treating Provider/Extender: Melburn Hake,  Weeks in Treatment: 5 Verbal / Phone Orders: No Diagnosis Coding ICD-10 Coding Code Description E11.621 Type 2 diabetes mellitus with foot ulcer L97.512 Non-pressure chronic ulcer of other part of right foot with fat layer exposed I10 Essential (primary) hypertension I48.2 Chronic atrial fibrillation Wound Cleansing Wound #1 Right,Plantar Foot o Clean wound with Normal Saline. Anesthetic (add to Medication List) Wound #1 Right,Plantar Foot o Topical Lidocaine 4% cream applied to wound bed prior to debridement (In Clinic Only). Skin Barriers/Peri-Wound Care Wound #1 Right,Plantar Foot o Skin Prep Primary Wound Dressing Wound #1 Right,Plantar Foot o Other: - silvercell Secondary Dressing Wound #1 Right,Plantar Foot o Boardered Foam Dressing Dressing Change Frequency Wound #1 Right,Plantar Foot o Change dressing every other day. Follow-up Appointments Wound #1 Right,Plantar Foot o Return Appointment in 1 week. Edema Control Wound #1 Right,Plantar Foot o Elevate legs to the level of the heart and pump ankles as often as possible Off-Loading Slimp, Lavonte J. ([id number]) Wound #1 Right,Plantar Foot o Open toe surgical shoe with peg assist. Additional Orders / Instructions Wound #1 Right,Plantar Foot o Increase protein intake. Medications-please add to medication list. Wound  #1 Right,Plantar Foot o P.O. Antibiotics - Take antibiotics as prescribed Electronic Signature(s) Signed: 04/19/2017 5:00:26 PM By: Roger Shelter Signed: 04/20/2017 8:40:52 AM By: Worthy Keeler PA-C Entered By: Roger Shelter on 04/19/2017 11:47:01 [name], [name] ([id number]) -------------------------------------------------------------------------------- Problem List Details Patient Name: DAISHAWN, LAUF. Date of Service: 04/19/2017 11:00 AM Medical Record Number: [id number] Patient Account Number: [id number] Date of Birth/Sex: [birth year] (67 y.o. M) Treating RN: Roger Shelter Primary Care Provider: Benita Stabile Other Clinician: Referring Provider: Benita Stabile Treating Provider/Extender: Melburn Hake,  Weeks in Treatment: 5 Active Problems ICD-10 Impacting Encounter Code Description Active Date Wound Healing Diagnosis E11.621 Type 2 diabetes mellitus with foot ulcer 03/09/2017 Yes L97.512 Non-pressure chronic ulcer of other part of right foot with fat 03/09/2017 Yes layer exposed I10 Essential (primary) hypertension 03/09/2017 Yes I48.2 Chronic atrial fibrillation 03/09/2017 Yes Inactive Problems Resolved Problems Electronic Signature(s) Signed: 04/20/2017 8:40:52 AM By: Worthy Keeler PA-C Entered By: Worthy Keeler on 04/19/2017 11:25:44 [name], [name] ([id number]) -------------------------------------------------------------------------------- Progress Note Details Patient Name: [name]. Date of Service: 04/19/2017 11:00 AM Medical Record Number: [id number] Patient Account Number: [id number] Date of Birth/Sex: 1950-03-31 (67 y.o. M) Treating RN: Roger Shelter Primary Care Provider: Benita Stabile Other Clinician: Referring Provider: Benita Stabile Treating Provider/Extender: Melburn Hake,  Weeks in Treatment: 5 Subjective Chief Complaint Information obtained from Patient Right plantar foot ulcer History of Present Illness (HPI) The following HPI elements were  documented for the patient's wound: Associated Signs and Symptoms: Patient has a history of diabetes mellitus type II, hypertension, and atrial fibrillation. He also has a hammertoe deformity of the bilateral feet which seems to be causing pressure in the ball of his foot. 03/09/17  on evaluation today patient presents for his initial evaluation concerning an ulcer on the plantar aspect of his right foot which has been open he tells me for about three weeks. He has been seen at [facility] where they did perform debridement it appears according to a note on 02/22/17 where unfortunately it appears that his callous area began to break down into an ulcer after modifications have been added to his insulin. This had happened previously as well. However I do not have details of the severity of debridement at that point although it does not sound as if you the debridement was too significant based on what the patient is telling me that he still had a lot of callous following debridement. With that being said they were going to look into altering his insoles to try to prevent further breakdown in the future. Nonetheless he has had foul odor discharge coming from the ulcer which is noted all the way back to that visit [facility]. Patient states that this was concerning him more than anything else. He does have diabetes although he described this as "borderline diabetes" he is on metoprolol however along with lisinopril. Patient is not having any issues with pain in regard to his right plantar foot. 03/16/17 on evaluation today patient appears to be doing much better in regard to his plantar foot ulcer. He actually tells me that he loves the peg assist offloading shoe and that he hasn't had any pain in the ulcer area from the callous since I worked on it last week. Overall he is extremely happy with how things have progressed. Likewise the wound bed has no slough noted there is no  evidence of infection and it looks excellent. I did receive the results of his hemoglobin A1c which showed a value of 5.9 which was elevated but actually rather well. Subsequently I did also receive the x-ray of his foot which showed diffuse degenerative change but no underlying acute bony abnormality. 03/23/17 on evaluation today patient appears to be doing better in regard to his right plantar foot ulcer. He continues to show signs of improvement there's definitely not as much drainage at this point. He has been tolerating the dressing changes without complication he is not happy with the peg assist offloading shoe but at the same time I do believe that it is making good progress as far as offloading is concerned. I still believe he may need to talk to a surgeon about surgically correcting a hammertoe in order to avoid additional pressure to the site especially since he Artie has diabetic shoes and they do not seem to have prevented callous buildup in ulcer formation. 03/30/17 on evaluation today patient appears to be doing better in regard to his right plantar foot ulcer. Continues to show signs of improvement which is good news. Unfortunately we were unable to get the appointment with the orthopedic, Dr. Doran Durand, whom I recommended for him due to patient having a balance at the practice. With that being said patient's foot ulcer does seem to be doing much better on evaluation today he still has some depth to the wound but overall we are seeing improvement and epithelialization week by week. Hopefully this is something that will close shortly. 04/05/17 on evaluation today patient unfortunately has what appears to be an area of the plantar surface of his ulcer where he had fluid collection where the callous grew over top of the wound bed. Unfortunately there was some pus  like material noted during cleansing that we did sent for culture today. Hopefully this is not truly an infection is or does not  appear to be any [name], [name]. ([id number]) evidence of erythema surrounding and maybe this is just simply a small setback with a fluid collection that has caused this issue. Nonetheless we will see what that shows when we get the culture back. I am also going to have it completely antibiotic which I previously placed him on anyway which will hopefully prevent any true infection from setting in. 04/12/17 on evaluation today patient's ulcer on the plantar aspect of his right foot actually appears to be better then during last weeks evaluation. In general he has been tolerating the dressing changes in utilizing the offloading shoe which does seem to be beneficial for him. With that being said he still seems to get some pressure to the area just not nearly as much as he previously had noted. Again fortunately there is no significant pain. 04/19/17 on evaluation today patient appears to be doing excellent in regard to his right foot ulcer. He has been tolerating the dressing changes without complication. There really has not been any drainage over the past few days he tells me. With that being said he seems to be doing excellent in my opinion at this point he does have some callous buildup. Patient History Information obtained from Patient. Family History Cancer - Siblings, Diabetes - Father, Heart Disease - Mother, Hypertension - Mother, No family history of Hereditary Spherocytosis, Kidney Disease, Lung Disease, Seizures, Stroke, Thyroid Problems, Tuberculosis. Social History Former smoker, Marital Status - Single, Alcohol Use - Never, Drug Use - No History, Caffeine Use - Moderate. Review of Systems (ROS) Constitutional Symptoms (General Health) Denies complaints or symptoms of Fever, Chills. Respiratory The patient has no complaints or symptoms. Cardiovascular The patient has no complaints or symptoms. Psychiatric The patient has no complaints or  symptoms. Objective Constitutional Well-nourished and well-hydrated in no acute distress. Vitals Time Taken: 11:22 AM, Height: 73 in, Weight: 231 lbs, BMI: 30.5, Temperature: 97.7 F, Pulse: 62 bpm, Respiratory Rate: 18 breaths/min, Blood Pressure: 147/66 mmHg. Respiratory normal breathing without difficulty. clear to auscultation bilaterally. Cardiovascular regular rate and rhythm with normal S1, S2. Psychiatric [name], [name] ([id number]) this patient is able to make decisions and demonstrates good insight into disease process. Alert and Oriented x 3. pleasant and cooperative. General Notes: Currently it would appear that patients wound is actually doing much better today and in fact I really feel like this may be completely healed. After cleaning the wound it did not appear that there was any evidence of true opening although there was an area that [place] I could've possibly debrided deeper I probably would've just cause more damage as I feel like this may actually be completely healed. For that reason I opted just to wait and reevaluate in one weeks time. Integumentary (Hair, Skin) Wound #1 status is Open. Original cause of wound was Gradually Appeared. The wound is located on the [place]. The wound measures 0.1cm length x 0.1cm width x 0.1cm depth; 0.008cm^2 area and 0.001cm^3 volume. There is Fat Layer (Subcutaneous Tissue) Exposed exposed. There is no tunneling or undermining noted. There is a none present amount of drainage noted. Foul odor after cleansing was noted. The wound margin is flat and intact. There is no granulation within the wound bed. There is a large (67-100%) amount of necrotic tissue within the wound bed including Eschar. The periwound skin appearance  exhibited: Callus. The periwound skin appearance did not exhibit: Crepitus, Excoriation, Induration, Rash, Scarring, Dry/Scaly, Maceration, Atrophie Blanche, Cyanosis, Ecchymosis, Hemosiderin Staining,  Mottled, Pallor, Rubor, Erythema. Periwound temperature was noted as No Abnormality. The periwound has tenderness on palpation. Assessment Active Problems ICD-10 E11.621 - Type 2 diabetes mellitus with foot ulcer L97.512 - Non-pressure chronic ulcer of other part of right foot with fat layer exposed I10 - Essential (primary) hypertension I48.2 - Chronic atrial fibrillation Plan Wound Cleansing: Wound #1 Right,Plantar Foot: Clean wound with Normal Saline. Anesthetic (add to Medication List): Wound #1 Right,Plantar Foot: Topical Lidocaine 4% cream applied to wound bed prior to debridement (In Clinic Only). Skin Barriers/Peri-Wound Care: Wound #1 Right,Plantar Foot: Skin Prep Primary Wound Dressing: Wound #1 Right,Plantar Foot: Other: - silvercell Secondary Dressing: Wound #1 Right,Plantar Foot: Boardered Foam Dressing Dressing Change Frequency: Wound #1 Right,Plantar Foot: Change dressing every other day. Follow-up Appointments: [name], [name] ([id number]) Wound #1 Right,Plantar Foot: Return Appointment in 1 week. Edema Control: Wound #1 Right,Plantar Foot: Elevate legs to the level of the heart and pump ankles as often as possible Off-Loading: Wound #1 Right,Plantar Foot: Open toe surgical shoe with peg assist. Additional Orders / Instructions: Wound #1 Right,Plantar Foot: Increase protein intake. Medications-please add to medication list.: Wound #1 Right,Plantar Foot: P.O. Antibiotics - Take antibiotics as prescribed I'm going to suggest at this point in time that we see the patient back for one week follow-up at this point hopefully that with time we will be able to completely fill out the wound. If he has no drainage or opening between now and then I think that will be completely reasonable. Patient is in agreement with plan. Please see above for specific wound care orders. We will see patient for re-evaluation in 1 week(s) here in the clinic. If anything worsens or  changes patient will contact our office for additional recommendations. Electronic Signature(s) Signed: 04/20/2017 8:40:52 AM By: Worthy Keeler PA-C Entered By: Worthy Keeler on 04/19/2017 17:30:06 [name], [name] ([id number]) -------------------------------------------------------------------------------- ROS/PFSH Details Patient Name: [name] Date of Service: 04/19/2017 11:00 AM Medical Record Number: [id number] Patient Account Number: [id number] Date of Birth/Sex: September 28, 1950 (67 y.o. M) Treating RN: Roger Shelter Primary Care Provider: Benita Stabile Other Clinician: Referring Provider: Benita Stabile Treating Provider/Extender: STONE III,  Weeks in Treatment: 5 Information Obtained From Patient Wound History Do you currently have one or more open woundso Yes How many open wounds do you currently haveo 1 Approximately how long have you had your woundso 3 weeks How have you been treating your wound(s) until nowo mupirocin Has your wound(s) ever healed and then re-openedo No Have you had any lab work done in the past montho No Have you tested positive for an antibiotic resistant organism (MRSA, VRE)o No Have you tested positive for osteomyelitis (bone infection)o No Have you had any tests for circulation on your legso No Constitutional Symptoms (General Health) Complaints and Symptoms: Negative for: Fever; Chills Eyes Medical History: Negative for: Cataracts; Glaucoma; Optic Neuritis Ear/Nose/Mouth/Throat Medical History: Negative for: Chronic sinus problems/congestion; Middle ear problems Hematologic/Lymphatic Medical History: Negative for: Anemia; Hemophilia; Human Immunodeficiency Virus; Lymphedema; Sickle Cell Disease Respiratory Complaints and Symptoms: No Complaints or Symptoms Medical History: Negative for: Aspiration; Asthma; Chronic Obstructive Pulmonary Disease (COPD); Pneumothorax; Sleep Apnea; Tuberculosis Cardiovascular Complaints and Symptoms: No  Complaints or Symptoms Medical History: Positive for: Arrhythmia - a fib; Hypertension Negative for: Angina; Congestive Heart Failure; Coronary Artery Disease; Deep Vein Thrombosis; Hypotension; Myocardial [name], [name] (  [id number]) Infarction; Peripheral Arterial Disease; Peripheral Venous Disease; Phlebitis; Vasculitis Gastrointestinal Medical History: Negative for: Cirrhosis ; Colitis; Crohnos; Hepatitis A; Hepatitis B; Hepatitis C Endocrine Medical History: Positive for: Type II Diabetes Treated with: Oral agents Blood sugar tested every day: No Immunological Medical History: Negative for: Lupus Erythematosus; Raynaudos; Scleroderma Integumentary (Skin) Medical History: Negative for: History of Burn; History of pressure wounds Musculoskeletal Medical History: Positive for: Osteoarthritis Negative for: Gout; Rheumatoid Arthritis; Osteomyelitis Neurologic Medical History: Negative for: Dementia; Neuropathy Oncologic Medical History: Negative for: Received Chemotherapy; Received Radiation Psychiatric Complaints and Symptoms: No Complaints or Symptoms Immunizations Pneumococcal Vaccine: Received Pneumococcal Vaccination: Yes Immunization Notes: up to date Implantable Devices Family and Social History Cancer: Yes - Siblings; Diabetes: Yes - Father; Heart Disease: Yes - Mother; Hereditary Spherocytosis: No; Hypertension: Yes - Mother; Kidney Disease: No; Lung Disease: No; Seizures: No; Stroke: No; Thyroid Problems: No; Tuberculosis: No; Former smoker; Marital Status - Single; Alcohol Use: Never; Drug Use: No History; Caffeine Use: Moderate; Financial OCIE, TINO ([id number]) Concerns: No; Food, Clothing or Shelter Needs: No; Support System Lacking: No; Transportation Concerns: No; Advanced Directives: No; Patient does not want information on Advanced Directives Physician Affirmation I have reviewed and agree with the above information. Electronic  Signature(s) Signed: 04/20/2017 8:40:52 AM By: Worthy Keeler PA-C Signed: 04/20/2017 3:09:28 PM By: Roger Shelter Entered By: Worthy Keeler on 04/19/2017 17:28:02 Bolander, [name] ([id number]) -------------------------------------------------------------------------------- SuperBill Details Patient Name: JAILYN, LANGHORST. Date of Service: 04/19/2017 Medical Record Number: [id number] Patient Account Number: [id number] Date of Birth/Sex: 1950-11-16 (67 y.o. M) Treating RN: Roger Shelter Primary Care Provider: Benita Stabile Other Clinician: Referring Provider: Benita Stabile Treating Provider/Extender: Melburn Hake,  Weeks in Treatment: 5 Diagnosis Coding ICD-10 Codes Code Description E11.621 Type 2 diabetes mellitus with foot ulcer L97.512 Non-pressure chronic ulcer of other part of right foot with fat layer exposed I10 Essential (primary) hypertension I48.2 Chronic atrial fibrillation Facility Procedures CPT4 Code: 16945038 Description: 88280 - WOUND CARE VISIT-LEV 2 EST PT Modifier: Quantity: 1 Physician Procedures CPT4 Code: 0349179 Description: 15056 - WC PHYS LEVEL 3 - EST PT ICD-10 Diagnosis Description E11.621 Type 2 diabetes mellitus with foot ulcer L97.512 Non-pressure chronic ulcer of other part of right foot with f I10 Essential (primary) hypertension I48.2 Chronic atrial  fibrillation Modifier: at layer exposed Quantity: 1 Electronic Signature(s) Signed: 04/20/2017 8:40:52 AM By: Worthy Keeler PA-C Entered By: Worthy Keeler on 04/19/2017 17:30:32

## 2017-04-23 DIAGNOSIS — E785 Hyperlipidemia, unspecified: Secondary | ICD-10-CM | POA: Diagnosis not present

## 2017-04-23 DIAGNOSIS — E038 Other specified hypothyroidism: Secondary | ICD-10-CM | POA: Diagnosis not present

## 2017-04-23 DIAGNOSIS — E119 Type 2 diabetes mellitus without complications: Secondary | ICD-10-CM | POA: Diagnosis not present

## 2017-04-23 NOTE — Progress Notes (Signed)
ANANIAS, KOLANDER ([id number]) Visit Report for 04/19/2017 Arrival Information Details Patient Name: [name], [name]. Date of Service: 04/19/2017 11:00 AM Medical Record Number: [id number] Patient Account Number: [id number] Date of Birth/Sex: Aug 18, 1950 (67 y.o. M) Treating RN: Ahmed Prima Primary Care Kynnedy Carreno: Benita Stabile Other Clinician: Referring Elija Mccamish: Benita Stabile Treating Sharry Beining/Extender: Melburn Hake, HOYT Weeks in Treatment: 5 Visit Information History Since Last Visit All ordered tests and consults were completed: No Patient Arrived: Ambulatory Added or deleted any medications: No Arrival Time: 11:17 Any new allergies or adverse reactions: No Accompanied By: self Had a fall or experienced change in No Transfer Assistance: None activities of daily living that may affect Patient Identification Verified: Yes risk of falls: Secondary Verification Process Completed: Yes Signs or symptoms of abuse/neglect since last visito No Patient Requires Transmission-Based No Hospitalized since last visit: No Precautions: Implantable device outside of the clinic excluding No Patient Has Alerts: Yes cellular tissue based products placed in the center Patient Alerts: DMII since last visit: Has Dressing in Place as Prescribed: Yes Pain Present Now: No Electronic Signature(s) Signed: 04/22/2017 4:32:30 PM By: Alric Quan Entered By: Alric Quan on 04/19/2017 11:18:30 Straughter, [name] ([id number]) -------------------------------------------------------------------------------- Clinic Level of Care Assessment Details Patient Name: [name], [name]. Date of Service: 04/19/2017 11:00 AM Medical Record Number: [id number] Patient Account Number: [id number] Date of Birth/Sex: 1950-09-23 (67 y.o. M) Treating RN: Roger Shelter Primary Care Keeghan Mcintire: Benita Stabile Other Clinician: Referring Clovis Mankins: TATE, Sharlet Salina Treating Margy Sumler/Extender: Melburn Hake, HOYT Weeks in Treatment: 5 Clinic  Level of Care Assessment Items TOOL 4 Quantity Score []  - Use when only an EandM is performed on FOLLOW-UP visit 0 ASSESSMENTS - Nursing Assessment / Reassessment X - Reassessment of Co-morbidities (includes updates in patient status) 1 10 X- 1 5 Reassessment of Adherence to Treatment Plan ASSESSMENTS - Wound and Skin Assessment / Reassessment X - Simple Wound Assessment / Reassessment - one wound 1 5 []  - 0 Complex Wound Assessment / Reassessment - multiple wounds []  - 0 Dermatologic / Skin Assessment (not related to wound area) ASSESSMENTS - Focused Assessment []  - Circumferential Edema Measurements - multi extremities 0 []  - 0 Nutritional Assessment / Counseling / Intervention []  - 0 Lower Extremity Assessment (monofilament, tuning fork, pulses) []  - 0 Peripheral Arterial Disease Assessment (using hand held doppler) ASSESSMENTS - Ostomy and/or Continence Assessment and Care []  - Incontinence Assessment and Management 0 []  - 0 Ostomy Care Assessment and Management (repouching, etc.) PROCESS - Coordination of Care X - Simple Patient / Family Education for ongoing care 1 15 []  - 0 Complex (extensive) Patient / Family Education for ongoing care []  - 0 Staff obtains Programmer, systems, Records, Test Results / Process Orders []  - 0 Staff telephones HHA, Nursing Homes / Clarify orders / etc []  - 0 Routine Transfer to another Facility (non-emergent condition) []  - 0 Routine Hospital Admission (non-emergent condition) []  - 0 New Admissions / Biomedical engineer / Ordering NPWT, Apligraf, etc. []  - 0 Emergency Hospital Admission (emergent condition) X- 1 10 Simple Discharge Coordination RYER, ASATO. ([id number]) []  - 0 Complex (extensive) Discharge Coordination PROCESS - Special Needs []  - Pediatric / Minor Patient Management 0 []  - 0 Isolation Patient Management []  - 0 Hearing / Language / Visual special needs []  - 0 Assessment of Community assistance (transportation, D/C  planning, etc.) []  - 0 Additional assistance / Altered mentation []  - 0 Support Surface(s) Assessment (bed, cushion, seat, etc.) INTERVENTIONS - Wound Cleansing / Measurement X - Simple Wound  Cleansing - one wound 1 5 []  - 0 Complex Wound Cleansing - multiple wounds X- 1 5 Wound Imaging (photographs - any number of wounds) []  - 0 Wound Tracing (instead of photographs) X- 1 5 Simple Wound Measurement - one wound []  - 0 Complex Wound Measurement - multiple wounds INTERVENTIONS - Wound Dressings X - Small Wound Dressing one or multiple wounds 1 10 []  - 0 Medium Wound Dressing one or multiple wounds []  - 0 Large Wound Dressing one or multiple wounds []  - 0 Application of Medications - topical []  - 0 Application of Medications - injection INTERVENTIONS - Miscellaneous []  - External ear exam 0 []  - 0 Specimen Collection (cultures, biopsies, blood, body fluids, etc.) []  - 0 Specimen(s) / Culture(s) sent or taken to Lab for analysis []  - 0 Patient Transfer (multiple staff / Civil Service fast streamer / Similar devices) []  - 0 Simple Staple / Suture removal (25 or less) []  - 0 Complex Staple / Suture removal (26 or more) []  - 0 Hypo / Hyperglycemic Management (close monitor of Blood Glucose) []  - 0 Ankle / Brachial Index (ABI) - do not check if billed separately X- 1 5 Vital Signs Moening, Dorr J. ([id number]) Has the patient been seen at the hospital within the last three years: Yes Total Score: 75 Level Of Care: New/Established - Level 2 Electronic Signature(s) Signed: 04/19/2017 5:00:26 PM By: Roger Shelter Entered By: Roger Shelter on 04/19/2017 11:50:08 Arkwright, [name] ([id number]) -------------------------------------------------------------------------------- Encounter Discharge Information Details Patient Name: [name]. Date of Service: 04/19/2017 11:00 AM Medical Record Number: [id number] Patient Account Number: [id number] Date of Birth/Sex: April 05, 1950 (67 y.o.  M) Treating RN: Roger Shelter Primary Care Avalyn Molino: Benita Stabile Other Clinician: Referring Pacer Dorn: Benita Stabile Treating Aviendha Azbell/Extender: Melburn Hake, HOYT Weeks in Treatment: 5 Encounter Discharge Information Items Discharge Pain Level: 0 Discharge Condition: Stable Ambulatory Status: Ambulatory Discharge Destination: Home Transportation: Private Auto Accompanied By: self Schedule Follow-up Appointment: Yes Medication Reconciliation completed and No provided to Patient/Care Anitra Doxtater: Provided on Clinical Summary of Care: 04/19/2017 Form Type Recipient Paper Patient WT Electronic Signature(s) Signed: 04/19/2017 12:59:55 PM By: Montey Hora Entered By: Montey Hora on 04/19/2017 12:59:55 Giannelli, [name] ([id number]) -------------------------------------------------------------------------------- Lower Extremity Assessment Details Patient Name: [name]. Date of Service: 04/19/2017 11:00 AM Medical Record Number: [id number] Patient Account Number: [id number] Date of Birth/Sex: 30-Nov-1950 (67 y.o. M) Treating RN: Ahmed Prima Primary Care Faith Patricelli: Benita Stabile Other Clinician: Referring Wyndham Santilli: TATE, Sharlet Salina Treating Joshuwa Vecchio/Extender: STONE III, HOYT Weeks in Treatment: 5 Vascular Assessment Pulses: Dorsalis Pedis Palpable: [Right:Yes] Posterior Tibial Extremity colors, hair growth, and conditions: Extremity Color: [Right:Normal] Temperature of Extremity: [Right:Warm] Capillary Refill: [Right:> 3 seconds] Toe Nail Assessment Left: Right: Thick: Yes Discolored: Yes Deformed: Yes Improper Length and Hygiene: No Electronic Signature(s) Signed: 04/22/2017 4:32:30 PM By: Alric Quan Entered By: Alric Quan on 04/19/2017 11:25:52 Ng, [name] ([id number]) -------------------------------------------------------------------------------- Multi Wound Chart Details Patient Name: [name]. Date of Service: 04/19/2017 11:00 AM Medical Record  Number: [id number] Patient Account Number: [id number] Date of Birth/Sex: 05-09-1950 (67 y.o. M) Treating RN: Roger Shelter Primary Care Makenize Messman: TATE, Sharlet Salina Other Clinician: Referring Calder Oblinger: TATE, Sharlet Salina Treating Mercia Dowe/Extender: STONE III, HOYT Weeks in Treatment: 5 Vital Signs Height(in): 73 Pulse(bpm): 33 Weight(lbs): 231 Blood Pressure(mmHg): 147/66 Body Mass Index(BMI): 30 Temperature(F): 97.7 Respiratory Rate 18 (breaths/min): Photos: [1:No Photos] [N/A:N/A] Wound Location: [1:Right Foot - Plantar] [N/A:N/A] Wounding Event: [1:Gradually Appeared] [N/A:N/A] Primary Etiology: [1:Diabetic Wound/Ulcer of the Lower Extremity] [N/A:N/A] Comorbid History: [1:Arrhythmia, Hypertension,  Type II Diabetes, Osteoarthritis] [N/A:N/A] Date Acquired: [1:02/15/2017] [N/A:N/A] Weeks of Treatment: [1:5] [N/A:N/A] Wound Status: [1:Open] [N/A:N/A] Pending Amputation on [1:Yes] [N/A:N/A] Presentation: Measurements L x W x D [1:0.1x0.1x0.1] [N/A:N/A] (cm) Area (cm) : [1:0.008] [N/A:N/A] Volume (cm) : [1:0.001] [N/A:N/A] % Reduction in Area: [1:94.90%] [N/A:N/A] % Reduction in Volume: [1:99.20%] [N/A:N/A] Classification: [1:Grade 2] [N/A:N/A] Exudate Amount: [1:None Present] [N/A:N/A] Foul Odor After Cleansing: [1:Yes] [N/A:N/A] Odor Anticipated Due to [1:No] [N/A:N/A] Product Use: Wound Margin: [1:Flat and Intact] [N/A:N/A] Granulation Amount: [1:None Present (0%)] [N/A:N/A] Necrotic Amount: [1:Large (67-100%)] [N/A:N/A] Necrotic Tissue: [1:Eschar] [N/A:N/A] Exposed Structures: [1:Fat Layer (Subcutaneous Tissue) Exposed: Yes Fascia: No Tendon: No Muscle: No Joint: No Bone: No] [N/A:N/A] Epithelialization: [1:None] [N/A:N/A] Periwound Skin Texture: [N/A:N/A] Callus: Yes Excoriation: No Induration: No Crepitus: No Rash: No Scarring: No Periwound Skin Moisture: Maceration: No N/A N/A Dry/Scaly: No Periwound Skin Color: Atrophie Blanche: No N/A N/A Cyanosis:  No Ecchymosis: No Erythema: No Hemosiderin Staining: No Mottled: No Pallor: No Rubor: No Temperature: No Abnormality N/A N/A Tenderness on Palpation: Yes N/A N/A Wound Preparation: Ulcer Cleansing: N/A N/A Rinsed/Irrigated with Saline Topical Anesthetic Applied: Other: lidocaine 4% Treatment Notes Electronic Signature(s) Signed: 04/19/2017 5:00:26 PM By: Roger Shelter Entered By: Roger Shelter on 04/19/2017 11:42:09 Kulpa, [name] ([id number]) -------------------------------------------------------------------------------- Multi-Disciplinary Care Plan Details Patient Name: [name], [name]. Date of Service: 04/19/2017 11:00 AM Medical Record Number: [id number] Patient Account Number: [id number] Date of Birth/Sex: [birth year] (67 y.o. M) Treating RN: Roger Shelter Primary Care Marce Schartz: Benita Stabile Other Clinician: Referring Sande Pickert: TATE, Sharlet Salina Treating Alece Koppel/Extender: Melburn Hake, HOYT Weeks in Treatment: 5 Active Inactive ` Nutrition Nursing Diagnoses: Imbalanced nutrition Impaired glucose control: actual or potential Potential for alteratiion in Nutrition/Potential for imbalanced nutrition Goals: Patient/caregiver agrees to and verbalizes understanding of need to use nutritional supplements and/or vitamins as prescribed Date Initiated: 03/09/2017 Target Resolution Date: 05/29/2017 Goal Status: Active Patient/caregiver will maintain therapeutic glucose control Date Initiated: 03/09/2017 Target Resolution Date: 05/29/2017 Goal Status: Active Interventions: Assess patient nutrition upon admission and as needed per policy Provide education on elevated blood sugars and impact on wound healing Provide education on nutrition Notes: ` Orientation to the Wound Care Program Nursing Diagnoses: Knowledge deficit related to the wound healing center program Goals: Patient/caregiver will verbalize understanding of the [place] Date Initiated:  03/09/2017 Target Resolution Date: 03/27/2017 Goal Status: Active Interventions: Provide education on orientation to the wound center Notes: ` Pain, Acute or Chronic Nursing Diagnoses: LOU, LOEWE ([id number]) Pain, acute or chronic: actual or potential Potential alteration in comfort, pain Goals: Patient/caregiver will verbalize adequate pain control between visits Date Initiated: 03/09/2017 Target Resolution Date: 05/29/2017 Goal Status: Active Interventions: Complete pain assessment as per visit requirements Notes: ` Pressure Nursing Diagnoses: Knowledge deficit related to causes and risk factors for pressure ulcer development Knowledge deficit related to management of pressures ulcers Potential for impaired tissue integrity related to pressure, friction, moisture, and shear Goals: Patient will remain free from development of additional pressure ulcers Date Initiated: 03/09/2017 Target Resolution Date: 06/26/2017 Goal Status: Active Interventions: Assess offloading mechanisms upon admission and as needed Notes: ` Wound/Skin Impairment Nursing Diagnoses: Impaired tissue integrity Knowledge deficit related to ulceration/compromised skin integrity Goals: Ulcer/skin breakdown will have a volume reduction of 80% by week 12 Date Initiated: 03/09/2017 Target Resolution Date: 06/19/2017 Goal Status: Active Interventions: Assess ulceration(s) every visit Notes: Electronic Signature(s) Signed: 04/19/2017 5:00:26 PM By: Roger Shelter Entered By: Roger Shelter on 04/19/2017 11:42:00 Maday, [name] ([id number]) -------------------------------------------------------------------------------- Pain Assessment  Details Patient Name: [name], [name]. Date of Service: 04/19/2017 11:00 AM Medical Record Number: [id number] Patient Account Number: [id number] Date of Birth/Sex: 06-01-1950 (67 y.o. M) Treating RN: Ahmed Prima Primary Care Donalyn Schneeberger: Benita Stabile Other  Clinician: Referring Galo Sayed: Benita Stabile Treating Jasmarie Coppock/Extender: STONE III, HOYT Weeks in Treatment: 5 Active Problems Location of Pain Severity and Description of Pain Patient Has Paino No Site Locations Pain Management and Medication Current Pain Management: Electronic Signature(s) Signed: 04/22/2017 4:32:30 PM By: Alric Quan Entered By: Alric Quan on 04/19/2017 11:18:39 Michiels, [name] ([id number]) -------------------------------------------------------------------------------- Patient/Caregiver Education Details Patient Name: [name] Date of Service: 04/19/2017 11:00 AM Medical Record Number: [id number] Patient Account Number: [id number] Date of Birth/Gender: 1950-04-04 (67 y.o. M) Treating RN: Montey Hora Primary Care Physician: Benita Stabile Other Clinician: Referring Physician: Benita Stabile Treating Physician/Extender: Sharalyn Ink in Treatment: 5 Education Assessment Education Provided To: Patient Education Topics Provided Wound/Skin Impairment: Handouts: Other: wound care a ordered Methods: Demonstration, Explain/Verbal Responses: State content correctly Electronic Signature(s) Signed: 04/19/2017 5:08:18 PM By: Montey Hora Entered By: Montey Hora on 04/19/2017 13:00:14 Dewald, [name] ([id number]) -------------------------------------------------------------------------------- Wound Assessment Details Patient Name: [name]. Date of Service: 04/19/2017 11:00 AM Medical Record Number: [id number] Patient Account Number: [id number] Date of Birth/Sex: 1950-04-26 (67 y.o. M) Treating RN: Ahmed Prima Primary Care Khrystal Jeanmarie: Benita Stabile Other Clinician: Referring Editha Bridgeforth: Benita Stabile Treating Tahsin Benyo/Extender: STONE III, HOYT Weeks in Treatment: 5 Wound Status Wound Number: 1 Primary Diabetic Wound/Ulcer of the Lower Extremity Etiology: Wound Location: Right Foot - Plantar Wound Status: Open Wounding Event: Gradually  Appeared Comorbid Arrhythmia, Hypertension, Type II Diabetes, Date Acquired: 02/15/2017 History: Osteoarthritis Weeks Of Treatment: 5 Clustered Wound: No Pending Amputation On Presentation Photos Photo Uploaded By: Roger Shelter on 04/19/2017 16:52:37 Wound Measurements Length: (cm) 0.1 Width: (cm) 0.1 Depth: (cm) 0.1 Area: (cm) 0.008 Volume: (cm) 0.001 % Reduction in Area: 94.9% % Reduction in Volume: 99.2% Epithelialization: None Tunneling: No Undermining: No Wound Description Classification: Grade 2 Wound Margin: Flat and Intact Exudate Amount: None Present Foul Odor After Cleansing: Yes Due to Product Use: No Slough/Fibrino Yes Wound Bed Granulation Amount: None Present (0%) Exposed Structure Necrotic Amount: Large (67-100%) Fascia Exposed: No Necrotic Quality: Eschar Fat Layer (Subcutaneous Tissue) Exposed: Yes Tendon Exposed: No Muscle Exposed: No Joint Exposed: No Bone Exposed: No Periwound Skin Texture Youngren, Jaikob J. ([id number]) Texture Color No Abnormalities Noted: No No Abnormalities Noted: No Callus: Yes Atrophie Blanche: No Crepitus: No Cyanosis: No Excoriation: No Ecchymosis: No Induration: No Erythema: No Rash: No Hemosiderin Staining: No Scarring: No Mottled: No Pallor: No Moisture Rubor: No No Abnormalities Noted: No Dry / Scaly: No Temperature / Pain Maceration: No Temperature: No Abnormality Tenderness on Palpation: Yes Wound Preparation Ulcer Cleansing: Rinsed/Irrigated with Saline Topical Anesthetic Applied: Other: lidocaine 4%, Treatment Notes Wound #1 (Right, Plantar Foot) 1. Cleansed with: Clean wound with Normal Saline 2. Anesthetic Topical Lidocaine 4% cream to wound bed prior to debridement 4. Dressing Applied: Other dressing (specify in notes) 5. Secondary Dressing Applied Bordered Foam Dressing Notes silvercel Electronic Signature(s) Signed: 04/22/2017 4:32:30 PM By: Alric Quan Entered By:  Alric Quan on 04/19/2017 11:24:58 Grewe, [name] ([id number]) -------------------------------------------------------------------------------- Vitals Details Patient Name: [name]. Date of Service: 04/19/2017 11:00 AM Medical Record Number: [id number] Patient Account Number: [id number] Date of Birth/Sex: 1950-12-21 (67 y.o. M) Treating RN: Ahmed Prima Primary Care Zayvien Canning: Benita Stabile Other Clinician: Referring Emonee Winkowski: Benita Stabile Treating Giliana Vantil/Extender: STONE III, HOYT Weeks in Treatment: 5  Vital Signs Time Taken: 11:22 Temperature (F): 97.7 Height (in): 73 Pulse (bpm): 62 Weight (lbs): 231 Respiratory Rate (breaths/min): 18 Body Mass Index (BMI): 30.5 Blood Pressure (mmHg): 147/66 Reference Range: 80 - 120 mg / dl Electronic Signature(s) Signed: 04/22/2017 4:32:30 PM By: Alric Quan Entered By: Alric Quan on 04/19/2017 11:22:36

## 2017-04-26 ENCOUNTER — Encounter: Payer: PPO | Admitting: Physician Assistant

## 2017-04-26 DIAGNOSIS — E11621 Type 2 diabetes mellitus with foot ulcer: Secondary | ICD-10-CM | POA: Diagnosis not present

## 2017-04-26 DIAGNOSIS — L97511 Non-pressure chronic ulcer of other part of right foot limited to breakdown of skin: Secondary | ICD-10-CM | POA: Diagnosis not present

## 2017-04-28 NOTE — Progress Notes (Signed)
PRANIT, OWENSBY ([id number]) Visit Report for 04/26/2017 Chief Complaint Document Details Patient Name: [name], [name]. Date of Service: 04/26/2017 10:15 AM Medical Record Number: [id number] Patient Account Number: [id number] Date of Birth/Sex: 01-Oct-1950 (67 y.o. M) Treating RN: Roger Shelter Primary Care Provider: Benita Stabile Other Clinician: Referring Provider: Benita Stabile Treating Provider/Extender: Melburn Hake, HOYT Weeks in Treatment: 6 Information Obtained from: Patient Chief Complaint Right plantar foot ulcer Electronic Signature(s) Signed: 04/27/2017 8:17:58 AM By: Worthy Keeler PA-C Entered By: Worthy Keeler on 04/26/2017 10:23:44 [name], [name] ([id number]) -------------------------------------------------------------------------------- Physician Orders Details Patient Name: [name]. Date of Service: 04/26/2017 10:15 AM Medical Record Number: [id number] Patient Account Number: [id number] Date of Birth/Sex: [birth year] (67 y.o. M) Treating RN: Roger Shelter Primary Care Provider: Benita Stabile Other Clinician: Referring Provider: Benita Stabile Treating Provider/Extender: Melburn Hake, HOYT Weeks in Treatment: 6 Verbal / Phone Orders: No Diagnosis Coding ICD-10 Coding Code Description E11.621 Type 2 diabetes mellitus with foot ulcer L97.512 Non-pressure chronic ulcer of other part of right foot with fat layer exposed I10 Essential (primary) hypertension I48.2 Chronic atrial fibrillation Wound Cleansing o Clean wound with Normal Saline. Anesthetic (add to Medication List) o Topical Lidocaine 4% cream applied to wound bed prior to debridement (In Clinic Only). Discharge From [facility] o Discharge from [facility] - treatment completed Electronic Signature(s) Signed: 04/26/2017 2:19:56 PM By: Roger Shelter Signed: 04/27/2017 8:17:58 AM By: Worthy Keeler PA-C Entered By: Roger Shelter on 04/26/2017 10:50:30 [name], [name]  ([id number]) -------------------------------------------------------------------------------- Problem List Details Patient Name: [name], [name]. Date of Service: 04/26/2017 10:15 AM Medical Record Number: [id number] Patient Account Number: [id number] Date of Birth/Sex: Nov 05, 1950 (67 y.o. M) Treating RN: Roger Shelter Primary Care Provider: Benita Stabile Other Clinician: Referring Provider: Benita Stabile Treating Provider/Extender: Melburn Hake, HOYT Weeks in Treatment: 6 Active Problems ICD-10 Impacting Encounter Code Description Active Date Wound Healing Diagnosis E11.621 Type 2 diabetes mellitus with foot ulcer 03/09/2017 Yes L97.512 Non-pressure chronic ulcer of other part of right foot with fat 03/09/2017 Yes layer exposed I10 Essential (primary) hypertension 03/09/2017 Yes I48.2 Chronic atrial fibrillation 03/09/2017 Yes Inactive Problems Resolved Problems Electronic Signature(s) Signed: 04/27/2017 8:17:58 AM By: Worthy Keeler PA-C Entered By: Worthy Keeler on 04/26/2017 10:23:33 [name], [name] ([id number]) -------------------------------------------------------------------------------- SuperBill Details Patient Name: [name]. Date of Service: 04/26/2017 Medical Record Number: [id number] Patient Account Number: [id number] Date of Birth/Sex: 1950-01-28 (67 y.o. M) Treating RN: Roger Shelter Primary Care Provider: Benita Stabile Other Clinician: Referring Provider: Benita Stabile Treating Provider/Extender: Melburn Hake, HOYT Weeks in Treatment: 6 Diagnosis Coding ICD-10 Codes Code Description E11.621 Type 2 diabetes mellitus with foot ulcer L97.512 Non-pressure chronic ulcer of other part of right foot with fat layer exposed I10 Essential (primary) hypertension I48.2 Chronic atrial fibrillation Facility Procedures CPT4 Code: [id number] Description: [phone] - WOUND CARE VISIT-LEV 2 EST PT Modifier: Quantity: 1 Physician Procedures CPT4 Code: 3267124 Description: 58099 - WC  PHYS LEVEL 2 - EST PT ICD-10 Diagnosis Description E11.621 Type 2 diabetes mellitus with foot ulcer L97.512 Non-pressure chronic ulcer of other part of right foot with f I10 Essential (primary) hypertension I48.2 Chronic atrial  fibrillation Modifier: at layer exposed Quantity: 1 Electronic Signature(s) Signed: 04/27/2017 8:17:58 AM By: Worthy Keeler PA-C Previous Signature: 04/26/2017 2:19:56 PM Version By: Roger Shelter Entered By: Worthy Keeler on 04/27/2017 07:52:34

## 2017-04-29 NOTE — Progress Notes (Signed)
DELAN, KSIAZEK ([id number]) Visit Report for 04/26/2017 Arrival Information Details Patient Name: [name], [name]. Date of Service: 04/26/2017 10:15 AM Medical Record Number: [id number] Patient Account Number: [id number] Date of Birth/Sex: 10/16/1950 (67 y.o. M) Treating RN: Cornell Barman Primary Care Carden Teel: Benita Stabile Other Clinician: Referring Nazair Fortenberry: Benita Stabile Treating Wilkin Lippy/Extender: Melburn Hake, HOYT Weeks in Treatment: 6 Visit Information History Since Last Visit Added or deleted any medications: No Patient Arrived: Ambulatory Any new allergies or adverse reactions: No Arrival Time: 10:28 Had a fall or experienced change in No Accompanied By: self activities of daily living that may affect Transfer Assistance: None risk of falls: Patient Identification Verified: Yes Signs or symptoms of abuse/neglect since last visito No Secondary Verification Process Completed: Yes Hospitalized since last visit: No Patient Requires Transmission-Based No Implantable device outside of the clinic excluding No Precautions: cellular tissue based products placed in the center Patient Has Alerts: Yes since last visit: Patient Alerts: DMII Has Dressing in Place as Prescribed: Yes Pain Present Now: No Electronic Signature(s) Signed: 04/28/2017 1:43:28 PM By: Gretta Cool, BSN, RN, CWS, Kim RN, BSN Entered By: Gretta Cool, BSN, RN, CWS, Kim on 04/26/2017 10:29:48 [name] ([id number]) -------------------------------------------------------------------------------- Clinic Level of Care Assessment Details Patient Name: [name], [name]. Date of Service: 04/26/2017 10:15 AM Medical Record Number: [id number] Patient Account Number: [id number] Date of Birth/Sex: [birth year] (67 y.o. M) Treating RN: Roger Shelter Primary Care Nashae Maudlin: Benita Stabile Other Clinician: Referring Larenz Frasier: Benita Stabile Treating Friend Dorfman/Extender: Melburn Hake, HOYT Weeks in Treatment: 6 Clinic Level of Care Assessment  Items TOOL 4 Quantity Score []  - Use when only an EandM is performed on FOLLOW-UP visit 0 ASSESSMENTS - Nursing Assessment / Reassessment []  - Reassessment of Co-morbidities (includes updates in patient status) 0 X- 1 5 Reassessment of Adherence to Treatment Plan ASSESSMENTS - Wound and Skin Assessment / Reassessment X - Simple Wound Assessment / Reassessment - one wound 1 5 []  - 0 Complex Wound Assessment / Reassessment - multiple wounds []  - 0 Dermatologic / Skin Assessment (not related to wound area) ASSESSMENTS - Focused Assessment []  - Circumferential Edema Measurements - multi extremities 0 []  - 0 Nutritional Assessment / Counseling / Intervention []  - 0 Lower Extremity Assessment (monofilament, tuning fork, pulses) []  - 0 Peripheral Arterial Disease Assessment (using hand held doppler) ASSESSMENTS - Ostomy and/or Continence Assessment and Care []  - Incontinence Assessment and Management 0 []  - 0 Ostomy Care Assessment and Management (repouching, etc.) PROCESS - Coordination of Care X - Simple Patient / Family Education for ongoing care 1 15 []  - 0 Complex (extensive) Patient / Family Education for ongoing care []  - 0 Staff obtains Programmer, systems, Records, Test Results / Process Orders []  - 0 Staff telephones HHA, Nursing Homes / Clarify orders / etc []  - 0 Routine Transfer to another Facility (non-emergent condition) []  - 0 Routine Hospital Admission (non-emergent condition) []  - 0 New Admissions / Biomedical engineer / Ordering NPWT, Apligraf, etc. []  - 0 Emergency Hospital Admission (emergent condition) X- 1 10 Simple Discharge Coordination [name], [name]. ([id number]) []  - 0 Complex (extensive) Discharge Coordination PROCESS - Special Needs []  - Pediatric / Minor Patient Management 0 []  - 0 Isolation Patient Management []  - 0 Hearing / Language / Visual special needs []  - 0 Assessment of Community assistance (transportation, D/C planning, etc.) []  -  0 Additional assistance / Altered mentation []  - 0 Support Surface(s) Assessment (bed, cushion, seat, etc.) INTERVENTIONS - Wound Cleansing / Measurement []  - Simple Wound Cleansing -  one wound 0 []  - 0 Complex Wound Cleansing - multiple wounds []  - 0 Wound Imaging (photographs - any number of wounds) []  - 0 Wound Tracing (instead of photographs) []  - 0 Simple Wound Measurement - one wound []  - 0 Complex Wound Measurement - multiple wounds INTERVENTIONS - Wound Dressings []  - Small Wound Dressing one or multiple wounds 0 []  - 0 Medium Wound Dressing one or multiple wounds []  - 0 Large Wound Dressing one or multiple wounds []  - 0 Application of Medications - topical []  - 0 Application of Medications - injection INTERVENTIONS - Miscellaneous []  - External ear exam 0 []  - 0 Specimen Collection (cultures, biopsies, blood, body fluids, etc.) []  - 0 Specimen(s) / Culture(s) sent or taken to Lab for analysis []  - 0 Patient Transfer (multiple staff / Civil Service fast streamer / Similar devices) []  - 0 Simple Staple / Suture removal (25 or less) []  - 0 Complex Staple / Suture removal (26 or more) []  - 0 Hypo / Hyperglycemic Management (close monitor of Blood Glucose) []  - 0 Ankle / Brachial Index (ABI) - do not check if billed separately X- 1 5 Vital Signs [name], [name] ([id number]) Has the patient been seen at the hospital within the last three years: Yes Total Score: 40 Level Of Care: New/Established - Level 2 Electronic Signature(s) Signed: 04/26/2017 2:19:56 PM By: Roger Shelter Entered By: Roger Shelter on 04/26/2017 10:50:59 [place], [name] ([id number]) -------------------------------------------------------------------------------- Encounter Discharge Information Details Patient Name: [name]. Date of Service: 04/26/2017 10:15 AM Medical Record Number: [id number] Patient Account Number: [id number] Date of Birth/Sex: 01/05/1951 (67 y.o. M) Treating RN: Roger Shelter Primary Care Torey Reinard: Benita Stabile Other Clinician: Referring Alaiza Yau: Benita Stabile Treating Zoella Roberti/Extender: Melburn Hake, HOYT Weeks in Treatment: 6 Encounter Discharge Information Items Schedule Follow-up Appointment: No Medication Reconciliation completed and No provided to Patient/Care Selen Smucker: Provided on Clinical Summary of Care: 04/26/2017 Form Type Recipient Paper Patient WT Electronic Signature(s) Signed: 04/27/2017 4:31:13 PM By: Ruthine Dose Entered By: Ruthine Dose on 04/26/2017 10:55:35 [name], [name] ([id number]) -------------------------------------------------------------------------------- Lower Extremity Assessment Details Patient Name: [name]. Date of Service: 04/26/2017 10:15 AM Medical Record Number: [id number] Patient Account Number: [id number] Date of Birth/Sex: 07-07-1950 (67 y.o. M) Treating RN: Cornell Barman Primary Care Keyerra Lamere: Benita Stabile Other Clinician: Referring Amilio Zehnder: Benita Stabile Treating Yamato Kopf/Extender: Melburn Hake, HOYT Weeks in Treatment: 6 Vascular Assessment Pulses: Dorsalis Pedis Palpable: [Right:Yes] Posterior Tibial Extremity colors, hair growth, and conditions: Extremity Color: [Right:Normal] Hair Growth on Extremity: [Right:No] Temperature of Extremity: [Right:Cool] Capillary Refill: [Right:< 3 seconds] Toe Nail Assessment Left: Right: Thick: Yes Discolored: Yes Deformed: No Improper Length and Hygiene: No Electronic Signature(s) Signed: 04/28/2017 1:43:28 PM By: Gretta Cool, BSN, RN, CWS, Kim RN, BSN Entered By: Gretta Cool, BSN, RN, CWS, Kim on 04/26/2017 10:35:50 [name], [name] ([id number]) -------------------------------------------------------------------------------- Multi Wound Chart Details Patient Name: [name]. Date of Service: 04/26/2017 10:15 AM Medical Record Number: [id number] Patient Account Number: [id number] Date of Birth/Sex: 07-Jul-1950 (67 y.o. M) Treating RN: Roger Shelter Primary Care  Basel Defalco: TATE, Sharlet Salina Other Clinician: Referring Raeann Offner: TATE, Sharlet Salina Treating Kaydyn Chism/Extender: STONE III, HOYT Weeks in Treatment: 6 Vital Signs Height(in): 73 Pulse(bpm): 81 Weight(lbs): 231 Blood Pressure(mmHg): 143/63 Body Mass Index(BMI): 30 Temperature(F): 97.9 Respiratory Rate 16 (breaths/min): Photos: [1:No Photos] [N/A:N/A] Wound Location: [1:Right Foot - Plantar] [N/A:N/A] Wounding Event: [1:Gradually Appeared] [N/A:N/A] Primary Etiology: [1:Diabetic Wound/Ulcer of the Lower Extremity] [N/A:N/A] Comorbid History: [1:Arrhythmia, Hypertension, Type II Diabetes, Osteoarthritis] [N/A:N/A] Date Acquired: [1:02/15/2017] [N/A:N/A] Weeks  of Treatment: [1:6] [N/A:N/A] Wound Status: [1:Open] [N/A:N/A] Pending Amputation on [1:Yes] [N/A:N/A] Presentation: Measurements L x W x D [1:0.1x0.1x0.1] [N/A:N/A] (cm) Area (cm) : [1:0.008] [N/A:N/A] Volume (cm) : [1:0.001] [N/A:N/A] % Reduction in Area: [1:94.90%] [N/A:N/A] % Reduction in Volume: [1:99.20%] [N/A:N/A] Classification: [1:Grade 2] [N/A:N/A] Exudate Amount: [1:None Present] [N/A:N/A] Foul Odor After Cleansing: [1:Yes] [N/A:N/A] Odor Anticipated Due to [1:No] [N/A:N/A] Product Use: Wound Margin: [1:Flat and Intact] [N/A:N/A] Granulation Amount: [1:None Present (0%)] [N/A:N/A] Necrotic Amount: [1:Large (67-100%)] [N/A:N/A] Necrotic Tissue: [1:Eschar] [N/A:N/A] Exposed Structures: [1:Fascia: No Fat Layer (Subcutaneous Tissue) Exposed: No Tendon: No Muscle: No Joint: No Bone: No Limited to Skin Breakdown] [N/A:N/A] Epithelialization: [1:Large (67-100%)] [N/A:N/A] Periwound Skin Texture: Callus: Yes N/A N/A Excoriation: No Induration: No Crepitus: No Rash: No Scarring: No Periwound Skin Moisture: Maceration: No N/A N/A Dry/Scaly: No Periwound Skin Color: Atrophie Blanche: No N/A N/A Cyanosis: No Ecchymosis: No Erythema: No Hemosiderin Staining: No Mottled: No Pallor: No Rubor: No Temperature: No  Abnormality N/A N/A Tenderness on Palpation: Yes N/A N/A Wound Preparation: Ulcer Cleansing: N/A N/A Rinsed/Irrigated with Saline Topical Anesthetic Applied: None Treatment Notes Electronic Signature(s) Signed: 04/26/2017 2:19:56 PM By: Roger Shelter Entered By: Roger Shelter on 04/26/2017 10:48:00 [name], [name] ([id number]) -------------------------------------------------------------------------------- Multi-Disciplinary Care Plan Details Patient Name: [name]. Date of Service: 04/26/2017 10:15 AM Medical Record Number: [id number] Patient Account Number: [id number] Date of Birth/Sex: [birth year] (67 y.o. M) Treating RN: Roger Shelter Primary Care [name] [name]: Benita Stabile Other Clinician: Referring Reef Achterberg: Benita Stabile Treating Rube Sanchez/Extender: Melburn Hake, HOYT Weeks in Treatment: 6 Active Inactive Electronic Signature(s) Signed: 04/26/2017 2:19:56 PM By: Roger Shelter Entered By: Roger Shelter on 04/26/2017 12:49:55 [name], [name] ([id number]) -------------------------------------------------------------------------------- Pain Assessment Details Patient Name: [name], [name]. Date of Service: 04/26/2017 10:15 AM Medical Record Number: [id number] Patient Account Number: [id number] Date of Birth/Sex: 1950-10-01 (67 y.o. M) Treating RN: Cornell Barman Primary Care Ashlynd Michna: Benita Stabile Other Clinician: Referring Creek Gan: Benita Stabile Treating Morganne Haile/Extender: Melburn Hake, HOYT Weeks in Treatment: 6 Active Problems Location of Pain Severity and Description of Pain Patient Has Paino No Site Locations With Dressing Change: No Pain Management and Medication Current Pain Management: Electronic Signature(s) Signed: 04/28/2017 1:43:28 PM By: Gretta Cool, BSN, RN, CWS, Kim RN, BSN Entered By: Gretta Cool, BSN, RN, CWS, Kim on 04/26/2017 10:29:56 [name], [name] ([id number]) -------------------------------------------------------------------------------- Wound Assessment  Details Patient Name: [name], [name]. Date of Service: 04/26/2017 10:15 AM Medical Record Number: [id number] Patient Account Number: [id number] Date of Birth/Sex: [birth year] (67 y.o. M) Treating RN: Roger Shelter Primary Care Elijah Phommachanh: Benita Stabile Other Clinician: Referring Noura Purpura: Benita Stabile Treating Britanee Vanblarcom/Extender: STONE III, HOYT Weeks in Treatment: 6 Wound Status Wound Number: 1 Primary Diabetic Wound/Ulcer of the Lower Extremity Etiology: Wound Location: Right, Plantar Foot Wound Status: Healed - Epithelialized Wounding Event: Gradually Appeared Comorbid Arrhythmia, Hypertension, Type II Diabetes, Date Acquired: 02/15/2017 History: Osteoarthritis Weeks Of Treatment: 6 Clustered Wound: No Pending Amputation On Presentation Photos Photo Uploaded By: Gretta Cool, BSN, RN, CWS, Kim on 04/26/2017 11:56:10 Wound Measurements Length: (cm) 0 % Red Width: (cm) 0 % Red Depth: (cm) 0 Epith Area: (cm) 0 Tunn Volume: (cm) 0 Unde uction in Area: 100% uction in Volume: 100% elialization: Large (67-100%) eling: No rmining: No Wound Description Classification: Grade 2 Wound Margin: Flat and Intact Exudate Amount: None Present Foul Odor After Cleansing: Yes Due to Product Use: No Slough/Fibrino Yes Wound Bed Granulation Amount: None Present (0%) Exposed Structure Necrotic Amount: Large (67-100%) Fascia Exposed: No Necrotic Quality: Eschar Fat Layer (  Subcutaneous Tissue) Exposed: No Tendon Exposed: No Muscle Exposed: No Joint Exposed: No Bone Exposed: No Limited to Skin Breakdown Periwound Skin Texture Texture Color No Abnormalities Noted: No No Abnormalities Noted: No [name], VILLANUEVA. ([id number]) Callus: Yes Atrophie Blanche: No Crepitus: No Cyanosis: No Excoriation: No Ecchymosis: No Induration: No Erythema: No Rash: No Hemosiderin Staining: No Scarring: No Mottled: No Pallor: No Moisture Rubor: No No Abnormalities Noted: No Dry / Scaly: No Temperature  / Pain Maceration: No Temperature: No Abnormality Tenderness on Palpation: Yes Wound Preparation Ulcer Cleansing: Rinsed/Irrigated with Saline Topical Anesthetic Applied: None Electronic Signature(s) Signed: 04/26/2017 2:19:56 PM By: Roger Shelter Entered By: Roger Shelter on 04/26/2017 10:49:02 Bentz, [name] ([id number]) -------------------------------------------------------------------------------- Vitals Details Patient Name: [name]. Date of Service: 04/26/2017 10:15 AM Medical Record Number: [id number] Patient Account Number: [id number] Date of Birth/Sex: [birth year] (67 y.o. M) Treating RN: Cornell Barman Primary Care Tamim Skog: Benita Stabile Other Clinician: Referring Zorianna Taliaferro: Benita Stabile Treating Caesar Mannella/Extender: Melburn Hake, HOYT Weeks in Treatment: 6 Vital Signs Time Taken: 10:29 Temperature (F): 97.9 Height (in): 73 Pulse (bpm): 81 Weight (lbs): 231 Respiratory Rate (breaths/min): 16 Body Mass Index (BMI): 30.5 Blood Pressure (mmHg): 143/63 Reference Range: 80 - 120 mg / dl Electronic Signature(s) Signed: 04/28/2017 1:43:28 PM By: Gretta Cool, BSN, RN, CWS, Kim RN, BSN Entered By: Gretta Cool, BSN, RN, CWS, Kim on 04/26/2017 10:30:30

## 2017-05-12 DIAGNOSIS — I480 Paroxysmal atrial fibrillation: Secondary | ICD-10-CM | POA: Diagnosis not present

## 2017-05-12 DIAGNOSIS — I5022 Chronic systolic (congestive) heart failure: Secondary | ICD-10-CM | POA: Diagnosis not present

## 2017-05-12 DIAGNOSIS — I519 Heart disease, unspecified: Secondary | ICD-10-CM | POA: Diagnosis not present

## 2017-05-12 DIAGNOSIS — I428 Other cardiomyopathies: Secondary | ICD-10-CM | POA: Diagnosis not present

## 2017-07-02 ENCOUNTER — Ambulatory Visit: Payer: PPO | Admitting: Physician Assistant

## 2017-07-05 ENCOUNTER — Encounter: Payer: PPO | Attending: Physician Assistant | Admitting: Physician Assistant

## 2017-07-05 DIAGNOSIS — E1151 Type 2 diabetes mellitus with diabetic peripheral angiopathy without gangrene: Secondary | ICD-10-CM | POA: Diagnosis not present

## 2017-07-05 DIAGNOSIS — E11621 Type 2 diabetes mellitus with foot ulcer: Secondary | ICD-10-CM | POA: Diagnosis not present

## 2017-07-05 DIAGNOSIS — Z87891 Personal history of nicotine dependence: Secondary | ICD-10-CM | POA: Diagnosis not present

## 2017-07-05 DIAGNOSIS — I119 Hypertensive heart disease without heart failure: Secondary | ICD-10-CM | POA: Diagnosis not present

## 2017-07-05 DIAGNOSIS — I482 Chronic atrial fibrillation: Secondary | ICD-10-CM | POA: Insufficient documentation

## 2017-07-05 DIAGNOSIS — L97512 Non-pressure chronic ulcer of other part of right foot with fat layer exposed: Secondary | ICD-10-CM | POA: Diagnosis not present

## 2017-07-05 DIAGNOSIS — Z794 Long term (current) use of insulin: Secondary | ICD-10-CM | POA: Diagnosis not present

## 2017-07-07 NOTE — Progress Notes (Signed)
TRACY, KINNER ([id number]) Visit Report for 07/05/2017 Allergy List Details Patient Name: [name], [name]. Date of Service: 07/05/2017 9:45 AM Medical Record Number: [id number] Patient Account Number: [id number] Date of Birth/Sex: March 30, 1950 (67 y.o. M) Treating RN: Ahmed Prima Primary Care Alison Breeding: Benita Stabile Other Clinician: Referring Welles Walthall: Referral, Self Treating Jalyne Brodzinski/Extender: STONE III, HOYT Weeks in Treatment: 0 Allergies Active Allergies penicillin Allergy Notes Electronic Signature(s) Signed: 07/05/2017 5:21:58 PM By: Alric Quan Entered By: Alric Quan on 07/05/2017 09:53:14 [place], [name] ([id number]) -------------------------------------------------------------------------------- Arrival Information Details Patient Name: [name], [name]. Date of Service: 07/05/2017 9:45 AM Medical Record Number: [id number] Patient Account Number: [id number] Date of Birth/Sex: 20-Jan-1950 (67 y.o. M) Treating RN: Ahmed Prima Primary Care Eureka Valdes: Benita Stabile Other Clinician: Referring Denean Pavon: Referral, Self Treating Parker Wherley/Extender: STONE III, HOYT Weeks in Treatment: 0 Visit Information Patient Arrived: Ambulatory Arrival Time: 09:50 Accompanied By: self Transfer Assistance: None Patient Identification Verified: Yes Secondary Verification Process Completed: Yes Patient Requires Transmission-Based No Precautions: Patient Has Alerts: Yes Patient Alerts: DM II History Since Last Visit All ordered tests and consults were completed: No Added or deleted any medications: No Any new allergies or adverse reactions: No Had a fall or experienced change in activities of daily living that may affect risk of falls: No Signs or symptoms of abuse/neglect since last visito No Hospitalized since last visit: No Implantable device outside of the clinic excluding cellular tissue based products placed in the center since last visit: No Has Dressing in Place as  Prescribed: Yes Pain Present Now: No Electronic Signature(s) Signed: 07/05/2017 5:21:58 PM By: Alric Quan Entered By: Alric Quan on 07/05/2017 09:50:46 [name], [name] ([id number]) -------------------------------------------------------------------------------- Clinic Level of Care Assessment Details Patient Name: [name], [name]. Date of Service: 07/05/2017 9:45 AM Medical Record Number: [id number] Patient Account Number: [id number] Date of Birth/Sex: May 27, 1950 (67 y.o. M) Treating RN: Roger Shelter Primary Care Elzada Pytel: Benita Stabile Other Clinician: Referring Boe Deans: Referral, Self Treating Kaylon Hitz/Extender: STONE III, HOYT Weeks in Treatment: 0 Clinic Level of Care Assessment Items TOOL 1 Quantity Score X - Use when EandM and Procedure is performed on INITIAL visit 1 0 ASSESSMENTS - Nursing Assessment / Reassessment X - General Physical Exam (combine w/ comprehensive assessment (listed just below) when 1 20 performed on new pt. evals) X- 1 25 Comprehensive Assessment (HX, ROS, Risk Assessments, Wounds Hx, etc.) ASSESSMENTS - Wound and Skin Assessment / Reassessment []  - Dermatologic / Skin Assessment (not related to wound area) 0 ASSESSMENTS - Ostomy and/or Continence Assessment and Care []  - Incontinence Assessment and Management 0 []  - 0 Ostomy Care Assessment and Management (repouching, etc.) PROCESS - Coordination of Care X - Simple Patient / Family Education for ongoing care 1 15 []  - 0 Complex (extensive) Patient / Family Education for ongoing care []  - 0 Staff obtains Programmer, systems, Records, Test Results / Process Orders []  - 0 Staff telephones HHA, Nursing Homes / Clarify orders / etc []  - 0 Routine Transfer to another Facility (non-emergent condition) []  - 0 Routine Hospital Admission (non-emergent condition) []  - 0 New Admissions / Biomedical engineer / Ordering NPWT, Apligraf, etc. []  - 0 Emergency Hospital Admission (emergent  condition) PROCESS - Special Needs []  - Pediatric / Minor Patient Management 0 []  - 0 Isolation Patient Management []  - 0 Hearing / Language / Visual special needs []  - 0 Assessment of Community assistance (transportation, D/C planning, etc.) []  - 0 Additional assistance / Altered mentation []  - 0 Support Surface(s) Assessment (bed, cushion, seat, etc.)  [name], [name] ([id number]) INTERVENTIONS - Miscellaneous []  - External ear exam 0 []  - 0 Patient Transfer (multiple staff / Harrel Lemon Lift / Similar devices) []  - 0 Simple Staple / Suture removal (25 or less) []  - 0 Complex Staple / Suture removal (26 or more) []  - 0 Hypo/Hyperglycemic Management (do not check if billed separately) X- 1 15 Ankle / Brachial Index (ABI) - do not check if billed separately Has the patient been seen at the hospital within the last three years: Yes Total Score: 75 Level Of Care: New/Established - Level 2 Electronic Signature(s) Signed: 07/06/2017 12:50:22 PM By: Roger Shelter Entered By: Roger Shelter on 07/06/2017 12:49:44 [name], [name] ([id number]) -------------------------------------------------------------------------------- Encounter Discharge Information Details Patient Name: [name]. Date of Service: 07/05/2017 9:45 AM Medical Record Number: [id number] Patient Account Number: [id number] Date of Birth/Sex: November 23, 1950 (67 y.o. M) Treating RN: Montey Hora Primary Care Laken Lobato: Benita Stabile Other Clinician: Referring Samyria Rudie: Referral, Self Treating Frayda Egley/Extender: Melburn Hake, HOYT Weeks in Treatment: 0 Encounter Discharge Information Items Discharge Condition: Stable Ambulatory Status: Ambulatory Discharge Destination: Home Transportation: Private Auto Accompanied By: self Schedule Follow-up Appointment: Yes Clinical Summary of Care: Electronic Signature(s) Signed: 07/05/2017 10:54:34 AM By: Montey Hora Entered By: Montey Hora on 07/05/2017 10:54:34 [place],  [name] ([id number]) -------------------------------------------------------------------------------- Lower Extremity Assessment Details Patient Name: [name]. Date of Service: 07/05/2017 9:45 AM Medical Record Number: [id number] Patient Account Number: [id number] Date of Birth/Sex: Mar 26, 1950 (67 y.o. M) Treating RN: Ahmed Prima Primary Care Makih Stefanko: Benita Stabile Other Clinician: Referring Blade Scheff: Referral, Self Treating Celsa Nordahl/Extender: STONE III, HOYT Weeks in Treatment: 0 Vascular Assessment Pulses: Dorsalis Pedis Palpable: [Left:Yes] [Right:Yes] Posterior Tibial Extremity colors, hair growth, and conditions: Extremity Color: [Left:Normal] [Right:Normal] Hair Growth on Extremity: [Left:Yes] [Right:Yes] Temperature of Extremity: [Left:Warm] [Right:Warm] Capillary Refill: [Left:< 3 seconds] [Right:< 3 seconds] Blood Pressure: Brachial: [Left:172] [Right:172] Dorsalis Pedis: 190 [Left:Dorsalis Pedis: 093] Ankle: Posterior Tibial: 219 [Left:Posterior Tibial: 198 1.27] [Right:1.15] Toe Nail Assessment Left: Right: Thick: Yes Yes Discolored: Yes Yes Deformed: Yes Yes Improper Length and Hygiene: Yes Yes Electronic Signature(s) Signed: 07/05/2017 5:21:58 PM By: Alric Quan Entered By: Alric Quan on 07/05/2017 10:15:43 [name], [name] ([id number]) -------------------------------------------------------------------------------- Multi Wound Chart Details Patient Name: [name]. Date of Service: 07/05/2017 9:45 AM Medical Record Number: [id number] Patient Account Number: [id number] Date of Birth/Sex: 06-14-1950 (67 y.o. M) Treating RN: Roger Shelter Primary Care Damian Hofstra: Benita Stabile Other Clinician: Referring Tyrica Afzal: Referral, Self Treating Jacey Eckerson/Extender: STONE III, HOYT Weeks in Treatment: 0 Vital Signs Height(in): 73 Pulse(bpm): 65 Weight(lbs): 243 Blood Pressure(mmHg): 157/84 Body Mass Index(BMI): 32 Temperature(F):  97.9 Respiratory Rate 18 (breaths/min): Wound Assessments Treatment Notes Electronic Signature(s) Signed: 07/05/2017 5:06:21 PM By: Roger Shelter Entered By: Roger Shelter on 07/05/2017 10:22:35 [name], [name] ([id number]) -------------------------------------------------------------------------------- Multi-Disciplinary Care Plan Details Patient Name: [name], [name]. Date of Service: 07/05/2017 9:45 AM Medical Record Number: [id number] Patient Account Number: [id number] Date of Birth/Sex: 1950-02-25 (67 y.o. M) Treating RN: Roger Shelter Primary Care Daisee Centner: Benita Stabile Other Clinician: Referring Maralyn Witherell: Referral, Self Treating Vinette Crites/Extender: STONE III, HOYT Weeks in Treatment: 0 Active Inactive ` Orientation to the Wound Care Program Nursing Diagnoses: Knowledge deficit related to the wound healing center program Goals: Patient/caregiver will verbalize understanding of the [place] Program Date Initiated: 07/05/2017 Target Resolution Date: 07/26/2017 Goal Status: Active Interventions: Provide education on orientation to the wound center Notes: ` Wound/Skin Impairment Nursing Diagnoses: Impaired tissue integrity Goals: Patient/caregiver will verbalize understanding of skin care regimen Date  Initiated: 07/05/2017 Target Resolution Date: 07/26/2017 Goal Status: Active Ulcer/skin breakdown will have a volume reduction of 30% by week 4 Date Initiated: 07/05/2017 Target Resolution Date: 07/26/2017 Goal Status: Active Interventions: Assess patient/caregiver ability to obtain necessary supplies Assess patient/caregiver ability to perform ulcer/skin care regimen upon admission and as needed Assess ulceration(s) every visit Treatment Activities: Skin care regimen initiated : 07/05/2017 Notes: Electronic Signature(s) Signed: 07/05/2017 5:06:21 PM By: Ihor Gully ([id number]) Entered By: Roger Shelter on 07/05/2017  10:22:16 [name], [name] ([id number]) -------------------------------------------------------------------------------- Pain Assessment Details Patient Name: [name]. Date of Service: 07/05/2017 9:45 AM Medical Record Number: [id number] Patient Account Number: [id number] Date of Birth/Sex: 1950/09/26 (67 y.o. M) Treating RN: Ahmed Prima Primary Care Deniah Saia: Benita Stabile Other Clinician: Referring Gevin Perea: Referral, Self Treating Kenzie Flakes/Extender: STONE III, HOYT Weeks in Treatment: 0 Active Problems Location of Pain Severity and Description of Pain Patient Has Paino No Site Locations Pain Management and Medication Current Pain Management: Electronic Signature(s) Signed: 07/05/2017 5:21:58 PM By: Alric Quan Entered By: Alric Quan on 07/05/2017 09:50:53 [name], [name] ([id number]) -------------------------------------------------------------------------------- Patient/Caregiver Education Details Patient Name: [name] Date of Service: 07/05/2017 9:45 AM Medical Record Number: [id number] Patient Account Number: [id number] Date of Birth/Gender: 01-26-1950 (67 y.o. M) Treating RN: Montey Hora Primary Care Physician: Benita Stabile Other Clinician: Referring Physician: Referral, Self Treating Physician/Extender: Melburn Hake, HOYT Weeks in Treatment: 0 Education Assessment Education Provided To: Patient Education Topics Provided Wound/Skin Impairment: Handouts: Other: wound care as ordered Methods: Demonstration, Explain/Verbal Responses: State content correctly Electronic Signature(s) Signed: 07/05/2017 5:27:17 PM By: Montey Hora Entered By: Montey Hora on 07/05/2017 10:55:08 [name], [name] ([id number]) -------------------------------------------------------------------------------- Wound Assessment Details Patient Name: [name]. Date of Service: 07/05/2017 9:45 AM Medical Record Number: [id number] Patient Account Number:  [id number] Date of Birth/Sex: 1950-09-20 (67 y.o. M) Treating RN: Roger Shelter Primary Care Steaven Wholey: Benita Stabile Other Clinician: Referring Lota Leamer: Referral, Self Treating Shamera Yarberry/Extender: STONE III, HOYT Weeks in Treatment: 0 Wound Status Wound Number: 2 Primary Diabetic Wound/Ulcer of the Lower Extremity Etiology: Wound Location: Right Metatarsal head second Wound Status: Open Wounding Event: Gradually Appeared Comorbid Arrhythmia, Hypertension, Type II Diabetes, Date Acquired: 06/28/2017 History: Osteoarthritis Weeks Of Treatment: 0 Clustered Wound: No Photos Photo Uploaded By: Roger Shelter on 07/05/2017 17:05:55 Wound Measurements Length: (cm) 0.4 Width: (cm) 0.3 Depth: (cm) 0.1 Area: (cm) 0.094 Volume: (cm) 0.009 % Reduction in Area: % Reduction in Volume: Epithelialization: None Tunneling: No Undermining: No Wound Description Classification: Grade 1 Wound Margin: Flat and Intact Exudate Amount: Medium Exudate Type: Sanguinous Exudate Color: red Foul Odor After Cleansing: No Slough/Fibrino Yes Wound Bed Granulation Amount: Large (67-100%) Exposed Structure Granulation Quality: Red Fascia Exposed: No Necrotic Amount: Small (1-33%) Fat Layer (Subcutaneous Tissue) Exposed: Yes Necrotic Quality: Eschar, Adherent Slough Tendon Exposed: No Muscle Exposed: No Joint Exposed: No Bone Exposed: No Periwound Skin Texture Abarca, Tiron J. ([id number]) Texture Color No Abnormalities Noted: No No Abnormalities Noted: No Callus: No Atrophie Blanche: No Crepitus: No Cyanosis: No Excoriation: No Ecchymosis: No Induration: No Erythema: No Rash: No Hemosiderin Staining: No Scarring: No Mottled: No Pallor: No Moisture Rubor: No No Abnormalities Noted: No Dry / Scaly: No Maceration: No Wound Preparation Ulcer Cleansing: Rinsed/Irrigated with Saline Topical Anesthetic Applied: None Treatment Notes Wound #2 (Right Metatarsal head second) 1.  Cleansed with: Clean wound with Normal Saline 2. Anesthetic Topical Lidocaine 4% cream to wound bed prior to debridement 4. Dressing Applied: Promogran 5. Secondary Dressing Applied Bordered Foam Dressing  Foam Electronic Signature(s) Signed: 07/05/2017 5:06:21 PM By: Roger Shelter Entered By: Roger Shelter on 07/05/2017 10:35:14 [place], [name] ([id number]) -------------------------------------------------------------------------------- Vitals Details Patient Name: [name] Date of Service: 07/05/2017 9:45 AM Medical Record Number: [id number] Patient Account Number: [id number] Date of Birth/Sex: February 26, 1950 (67 y.o. M) Treating RN: Ahmed Prima Primary Care Kimani Bedoya: Benita Stabile Other Clinician: Referring Anabel Lykins: Referral, Self Treating Lux Skilton/Extender: STONE III, HOYT Weeks in Treatment: 0 Vital Signs Time Taken: 09:50 Temperature (F): 97.9 Height (in): 73 Pulse (bpm): 65 Source: Stated Respiratory Rate (breaths/min): 18 Weight (lbs): 243 Blood Pressure (mmHg): 157/84 Source: Measured Reference Range: 80 - 120 mg / dl Body Mass Index (BMI): 32.1 Electronic Signature(s) Signed: 07/05/2017 5:21:58 PM By: Alric Quan Entered By: Alric Quan on 07/05/2017 09:53:00

## 2017-07-07 NOTE — Progress Notes (Signed)
[name] ([id number]) Visit Report for 07/05/2017 Abuse/Suicide Risk Screen Details Patient Name: [name], [name]. Date of Service: 07/05/2017 9:45 AM Medical Record Number: [id number] Patient Account Number: [id number] Date of Birth/Sex: January 13, 1951 (67 y.o. M) Treating RN: Ahmed Prima Primary Care Bayden Gil: Benita Stabile Other Clinician: Referring Kamaury Cutbirth: Referral, Self Treating Kaesen Rodriguez/Extender: STONE III, HOYT Weeks in Treatment: 0 Abuse/Suicide Risk Screen Items Answer ABUSE/SUICIDE RISK SCREEN: Has anyone close to you tried to hurt or harm you recentlyo No Do you feel uncomfortable with anyone in your familyo No Has anyone forced you do things that you didnot want to doo No Do you have any thoughts of harming yourselfo No Patient displays signs or symptoms of abuse and/or neglect. No Electronic Signature(s) Signed: 07/05/2017 5:21:58 PM By: Alric Quan Entered By: Alric Quan on 07/05/2017 09:58:51 [name] ([id number]) -------------------------------------------------------------------------------- Activities of Daily Living Details Patient Name: [name]. Date of Service: 07/05/2017 9:45 AM Medical Record Number: [id number] Patient Account Number: [id number] Date of Birth/Sex: 1950/06/05 (67 y.o. M) Treating RN: Ahmed Prima Primary Care Charle Mclaurin: Benita Stabile Other Clinician: Referring Keturah Yerby: Referral, Self Treating Jaylani Mcguinn/Extender: STONE III, HOYT Weeks in Treatment: 0 Activities of Daily Living Items Answer Activities of Daily Living (Please select one for each item) Drive Automobile Completely Able Take Medications Completely Able Use Telephone Completely Able Care for Appearance Completely Able Use Toilet Completely Able Bath / Shower Completely Able Dress Self Completely Able Feed Self Completely Able Walk Completely Able Get In / Out Bed Completely Able Housework Completely Able Prepare Meals Completely Able Handle  Money Completely Able Shop for Self Completely Able Electronic Signature(s) Signed: 07/05/2017 5:21:58 PM By: Alric Quan Entered By: Alric Quan on 07/05/2017 09:59:09 [name] ([id number]) -------------------------------------------------------------------------------- Education Assessment Details Patient Name: [name]. Date of Service: 07/05/2017 9:45 AM Medical Record Number: [id number] Patient Account Number: [id number] Date of Birth/Sex: [birth year] (67 y.o. M) Treating RN: Ahmed Prima Primary Care Tearah Saulsbury: Benita Stabile Other Clinician: Referring Anarie Kalish: Referral, Self Treating Kenniya Westrich/Extender: Melburn Hake, HOYT Weeks in Treatment: 0 Primary Learner Assessed: Patient Learning Preferences/Education Level/Primary Language Learning Preference: Explanation Highest Education Level: High School Preferred Language: English Cognitive Barrier Assessment/Beliefs Language Barrier: No Translator Needed: No Memory Deficit: No Emotional Barrier: No Cultural/Religious Beliefs Affecting Medical Care: No Physical Barrier Assessment Impaired Vision: No Impaired Hearing: No Decreased Hand dexterity: No Knowledge/Comprehension Assessment Knowledge Level: Medium Comprehension Level: Medium Ability to understand written Medium instructions: Ability to understand verbal Medium instructions: Motivation Assessment Anxiety Level: Calm Cooperation: Cooperative Education Importance: Acknowledges Need Interest in Health Problems: Asks Questions Perception: Coherent Willingness to Engage in Self- Medium Management Activities: Readiness to Engage in Self- Medium Management Activities: Electronic Signature(s) Signed: 07/05/2017 5:21:58 PM By: Alric Quan Entered By: Alric Quan on 07/05/2017 10:00:34 [name] ([id number]) -------------------------------------------------------------------------------- Fall Risk Assessment Details Patient  Name: [name]. Date of Service: 07/05/2017 9:45 AM Medical Record Number: [id number] Patient Account Number: [id number] Date of Birth/Sex: 10-09-1950 (67 y.o. M) Treating RN: Ahmed Prima Primary Care Ahtziry Saathoff: Benita Stabile Other Clinician: Referring Nashid Pellum: Referral, Self Treating Jeidy Hoerner/Extender: STONE III, HOYT Weeks in Treatment: 0 Fall Risk Assessment Items Have you had 2 or more falls in the last 12 monthso 0 No Have you had any fall that resulted in injury in the last 12 monthso 0 No FALL RISK ASSESSMENT: History of falling - immediate or within 3 months 0 No Secondary diagnosis 0 No Ambulatory aid None/bed rest/wheelchair/nurse 0 No Crutches/cane/walker 0 No Furniture 0 No  IV Access/Saline Lock 0 No Gait/Training Normal/bed rest/immobile 0 No Weak 0 No Impaired 0 No Mental Status Oriented to own ability 0 Yes Electronic Signature(s) Signed: 07/05/2017 5:21:58 PM By: Alric Quan Entered By: Alric Quan on 07/05/2017 10:00:42 [name] ([id number]) -------------------------------------------------------------------------------- Foot Assessment Details Patient Name: [name]. Date of Service: 07/05/2017 9:45 AM Medical Record Number: [id number] Patient Account Number: [id number] Date of Birth/Sex: 1950-10-30 (67 y.o. M) Treating RN: Ahmed Prima Primary Care Hervey Wedig: Benita Stabile Other Clinician: Referring Tallen Schnorr: Referral, Self Treating Yvan Dority/Extender: STONE III, HOYT Weeks in Treatment: 0 Foot Assessment Items Site Locations + = Sensation present, - = Sensation absent, C = Callus, U = Ulcer R = Redness, W = Warmth, M = Maceration, PU = Pre-ulcerative lesion F = Fissure, S = Swelling, D = Dryness Assessment Right: Left: Other Deformity: No No Prior Foot Ulcer: No No Prior Amputation: No No Charcot Joint: No No Ambulatory Status: Ambulatory Without Help Gait: Steady Electronic Signature(s) Signed: 07/05/2017 5:21:58 PM  By: Alric Quan Entered By: Alric Quan on 07/05/2017 10:02:58 [name] ([id number]) -------------------------------------------------------------------------------- Nutrition Risk Assessment Details Patient Name: [name]. Date of Service: 07/05/2017 9:45 AM Medical Record Number: [id number] Patient Account Number: [id number] Date of Birth/Sex: [birth year] (67 y.o. M) Treating RN: Ahmed Prima Primary Care Ashaki Frosch: Benita Stabile Other Clinician: Referring Kyrah Schiro: Referral, Self Treating Laddie Math/Extender: STONE III, HOYT Weeks in Treatment: 0 Height (in): 73 Weight (lbs): 243 Body Mass Index (BMI): 32.1 Nutrition Risk Assessment Items NUTRITION RISK SCREEN: I have an illness or condition that made me change the kind and/or amount of 0 No food I eat I eat fewer than two meals per day 0 No I eat few fruits and vegetables, or milk products 0 No I have three or more drinks of beer, liquor or wine almost every day 0 No I have tooth or mouth problems that make it hard for me to eat 0 No I don't always have enough money to buy the food I need 0 No I eat alone most of the time 0 No I take three or more different prescribed or over-the-counter drugs a day 1 Yes Without wanting to, I have lost or gained 10 pounds in the last six months 0 No I am not always physically able to shop, cook and/or feed myself 0 No Nutrition Protocols Good Risk Protocol Moderate Risk Protocol Electronic Signature(s) Signed: 07/05/2017 5:21:58 PM By: Alric Quan Entered By: Alric Quan on 07/05/2017 10:00:52

## 2017-07-08 NOTE — Progress Notes (Signed)
ADAL, SERENO ([id number]) Visit Report for 07/05/2017 Chief Complaint Document Details Patient Name: [name], [name]. Date of Service: 07/05/2017 9:45 AM Medical Record Number: [id number] Patient Account Number: [id number] Date of Birth/Sex: 1950/09/25 (67 y.o. M) Treating RN: Roger Shelter Primary Care Provider: Benita Stabile Other Clinician: Referring Provider: Referral, Self Treating Provider/Extender: Melburn Hake, HOYT Weeks in Treatment: 0 Information Obtained from: Patient Chief Complaint Right plantar foot ulcer Electronic Signature(s) Signed: 07/06/2017 1:56:08 AM By: Worthy Keeler PA-C Entered By: Worthy Keeler on 07/06/2017 01:43:15 Leicht, [name] ([id number]) -------------------------------------------------------------------------------- Debridement Details Patient Name: [name]. Date of Service: 07/05/2017 9:45 AM Medical Record Number: [id number] Patient Account Number: [id number] Date of Birth/Sex: 08/08/1950 (67 y.o. M) Treating RN: Roger Shelter Primary Care Provider: Benita Stabile Other Clinician: Referring Provider: Referral, Self Treating Provider/Extender: STONE III, HOYT Weeks in Treatment: 0 Debridement Performed for Wound #2 Right Metatarsal head second Assessment: Performed By: Physician STONE III, HOYT E., PA-C Debridement Type: Debridement Severity of Tissue Pre Fat layer exposed Debridement: Pre-procedure Verification/Time Yes - 10:30 Out Taken: Start Time: 10:30 Pain Control: Lidocaine 4% Topical Solution Total Area Debrided (L x W): 0.4 (cm) x 0.3 (cm) = 0.12 (cm) Tissue and other material Viable, Non-Viable, Callus, Slough, Subcutaneous, Slough debrided: Level: Skin/Subcutaneous Tissue Debridement Description: Excisional Instrument: Curette Bleeding: Minimum Hemostasis Achieved: Pressure End Time: 10:35 Procedural Pain: Insensate Post Procedural Pain: Insensate Response to Treatment: Procedure was tolerated well Level of  Consciousness: Awake and Alert Post Debridement Measurements of Total Wound Length: (cm) 0.4 Width: (cm) 0.03 Depth: (cm) 0.2 Volume: (cm) 0.002 Character of Wound/Ulcer Post Debridement: Improved Severity of Tissue Post Debridement: Fat layer exposed Post Procedure Diagnosis Same as Pre-procedure Electronic Signature(s) Signed: 07/06/2017 1:56:08 AM By: Worthy Keeler PA-C Signed: 07/06/2017 12:50:22 PM By: Roger Shelter Entered By: Worthy Keeler on 07/06/2017 01:46:32 Amory, [name] ([id number]) -------------------------------------------------------------------------------- HPI Details Patient Name: [name]. Date of Service: 07/05/2017 9:45 AM Medical Record Number: [id number] Patient Account Number: [id number] Date of Birth/Sex: Mar 04, 1950 (67 y.o. M) Treating RN: Roger Shelter Primary Care Provider: Benita Stabile Other Clinician: Referring Provider: Referral, Self Treating Provider/Extender: STONE III, HOYT Weeks in Treatment: 0 History of Present Illness Associated Signs and Symptoms: Patient has a history of diabetes mellitus type II, hypertension, and atrial fibrillation. He also has a hammertoe deformity of the bilateral feet which seems to be causing pressure in the ball of his foot. HPI Description: 03/09/17 on evaluation today patient presents for his initial evaluation concerning an ulcer on the plantar aspect of his right foot which has been open he tells me for about three weeks. He has been seen at [facility] where they did perform debridement it appears according to a note on 02/22/17 where unfortunately it appears that his callous area began to break down into an ulcer after modifications have been added to his insulin. This had happened previously as well. However I do not have details of the severity of debridement at that point although it does not sound as if you the debridement was too significant based on what the patient is telling me that  he still had a lot of callous following debridement. With that being said they were going to look into altering his insoles to try to prevent further breakdown in the future. Nonetheless he has had foul odor discharge coming from the ulcer which is noted all the way back to that visit [facility]. Patient states  that this was concerning him more than anything else. He does have diabetes although he described this as "borderline diabetes" he is on metoprolol however along with lisinopril. Patient is not having any issues with pain in regard to his right plantar foot. 03/16/17 on evaluation today patient appears to be doing much better in regard to his plantar foot ulcer. He actually tells me that he loves the peg assist offloading shoe and that he hasn't had any pain in the ulcer area from the callous since I worked on it last week. Overall he is extremely happy with how things have progressed. Likewise the wound bed has no slough noted there is no evidence of infection and it looks excellent. I did receive the results of his hemoglobin A1c which showed a value of 5.9 which was elevated but actually rather well. Subsequently I did also receive the x-ray of his foot which showed diffuse degenerative change but no underlying acute bony abnormality. 03/23/17 on evaluation today patient appears to be doing better in regard to his right plantar foot ulcer. He continues to show signs of improvement there's definitely not as much drainage at this point. He has been tolerating the dressing changes without complication he is not happy with the peg assist offloading shoe but at the same time I do believe that it is making good progress as far as offloading is concerned. I still believe he may need to talk to a surgeon about surgically correcting a hammertoe in order to avoid additional pressure to the site especially since he Artie has diabetic shoes and they do not seem to have prevented  callous buildup in ulcer formation. 03/30/17 on evaluation today patient appears to be doing better in regard to his right plantar foot ulcer. Continues to show signs of improvement which is good news. Unfortunately we were unable to get the appointment with the orthopedic, Dr. Doran Durand, whom I recommended for him due to patient having a balance at the practice. With that being said patient's foot ulcer does seem to be doing much better on evaluation today he still has some depth to the wound but overall we are seeing improvement and epithelialization week by week. Hopefully this is something that will close shortly. 04/05/17 on evaluation today patient unfortunately has what appears to be an area of the plantar surface of his ulcer where he had fluid collection where the callous grew over top of the wound bed. Unfortunately there was some pus like material noted during cleansing that we did sent for culture today. Hopefully this is not truly an infection is or does not appear to be any evidence of erythema surrounding and maybe this is just simply a small setback with a fluid collection that has caused this issue. Nonetheless we will see what that shows when we get the culture back. I am also going to have it completely antibiotic which I previously placed him on anyway which will hopefully prevent any true infection from setting in. 04/12/17 on evaluation today patient's ulcer on the plantar aspect of his right foot actually appears to be better then during last weeks evaluation. In general he has been tolerating the dressing changes in utilizing the offloading shoe which does seem to be beneficial for him. With that being said he still seems to get some pressure to the area just not nearly as much as he previously had noted. Again fortunately there is no significant pain. AMBROSE, WILE ([id number]) 04/19/17 on evaluation today patient appears to be  doing excellent in regard to his right foot ulcer. He  has been tolerating the dressing changes without complication. There really has not been any drainage over the past few days he tells me. With that being said he seems to be doing excellent in my opinion at this point he does have some callous buildup. 04/26/17 On evaluation today patient's wound appears to be completely healed which is great news. He has been tolerating the dressing changes without complication and I do think he has done very well in regard to the healing process and offloading he has listed everything that that we instructed him to do. Obviously this appears to have paid off and he has progressed very nicely. Readmission: 07/05/17 on evaluation today patient is seen for fault evaluation and our clinic concerning the same issue that I have previously treated him for ending back in April 2019. This is a callous region with subsequently an ulcer underneath this on the plantar aspect of his right second metatarsal region. Fortunately he's not having any significant discomfort although his girlfriend told him that she noted an area of bruising at the site underneath the callous and wanted him to come get this checked out. He was very please with our care previous and therefore was more than happy to come let us check this for him. Upon inspection initially he did have significant callous overlying the area in question. It was not easily identifiable as far as any ulcer was concerned. With that being said he with this significant callous did require some sharp debridement and following debridement there did appear to be a small ulcer underlying. Fortunately patient in general shows no signs of infection at this point. He again has neuropathy and therefore does not have any ongoing discomfort or pain at this point. Electronic Signature(s) Signed: 07/06/2017 5:57:54 PM By: Worthy Keeler PA-C Entered By: Worthy Keeler on 07/06/2017 17:55:58 Gilberto, [name]  ([id number]) -------------------------------------------------------------------------------- Physical Exam Details Patient Name: KALA, GASSMANN. Date of Service: 07/05/2017 9:45 AM Medical Record Number: [id number] Patient Account Number: [id number] Date of Birth/Sex: [birth year] (67 y.o. M) Treating RN: Roger Shelter Primary Care Provider: Benita Stabile Other Clinician: Referring Provider: Referral, Self Treating Provider/Extender: STONE III, HOYT Weeks in Treatment: 0 Constitutional patient is hypertensive.. pulse regular and within target range for patient.[name] respirations regular, non-labored and within target range for patient.[name] temperature within target range for patient.. Well-nourished and well-hydrated in no acute distress. Eyes conjunctiva clear no eyelid edema noted. pupils equal round and reactive to light and accommodation. Ears, Nose, Mouth, and Throat no gross abnormality of ear auricles or external auditory canals. normal hearing noted during conversation. mucus membranes moist. Respiratory normal breathing without difficulty. clear to auscultation bilaterally. Cardiovascular regular rate and rhythm with normal S1, S2. no clubbing, cyanosis, significant edema, <3 sec cap refill. Gastrointestinal (GI) soft, non-tender, non-distended, +BS. no ventral hernia noted. Musculoskeletal normal gait and posture. no significant deformity or arthritic changes, no loss or range of motion, no clubbing. Psychiatric this patient is able to make decisions and demonstrates good insight into disease process. Alert and Oriented x 3. pleasant and cooperative. Notes Upon inspection I did perform quite sniff get debridement of the callous at this point which the patient tolerated without complication there is no pain and really only minimal bleeding at the very end when I got to the area which actually did rebuild and show that the patient had a small ulceration on the plantar aspect of the  foot.  With that being said he overall I think will do very well with this it's definitely not as significant as when I last saw him he had to have a much more significant and extensive debridement at that point. Electronic Signature(s) Signed: 07/06/2017 5:57:54 PM By: Worthy Keeler PA-C Entered By: Worthy Keeler on 07/06/2017 17:56:46 Leija, [name] ([id number]) -------------------------------------------------------------------------------- Physician Orders Details Patient Name: KREE, RAFTER Date of Service: 07/05/2017 9:45 AM Medical Record Number: [id number] Patient Account Number: [id number] Date of Birth/Sex: Sep 23, 1950 (67 y.o. M) Treating RN: Roger Shelter Primary Care Provider: Benita Stabile Other Clinician: Referring Provider: Referral, Self Treating Provider/Extender: Melburn Hake, HOYT Weeks in Treatment: 0 Verbal / Phone Orders: No Diagnosis Coding ICD-10 Coding Code Description E11.621 Type 2 diabetes mellitus with foot ulcer L97.512 Non-pressure chronic ulcer of other part of right foot with fat layer exposed I10 Essential (primary) hypertension I48.2 Chronic atrial fibrillation Wound Cleansing Wound #2 Right Metatarsal head second o Clean wound with Normal Saline. Anesthetic (add to Medication List) Wound #2 Right Metatarsal head second o Topical Lidocaine 4% cream applied to wound bed prior to debridement (In Clinic Only). Primary Wound Dressing Wound #2 Right Metatarsal head second o Silver Collagen Secondary Dressing o Boardered Foam Dressing Dressing Change Frequency Wound #2 Right Metatarsal head second o Change dressing every other day. Follow-up Appointments Wound #2 Right Metatarsal head second o Return Appointment in 1 week. Electronic Signature(s) Signed: 07/06/2017 12:50:22 PM By: Roger Shelter Signed: 07/06/2017 5:57:54 PM By: Worthy Keeler PA-C Entered By: Roger Shelter on 07/06/2017 12:48:53 Wymore, [name]  ([id number]) -------------------------------------------------------------------------------- Problem List Details Patient Name: HARUTO, DEMARIA. Date of Service: 07/05/2017 9:45 AM Medical Record Number: [id number] Patient Account Number: [id number] Date of Birth/Sex: February 21, 1950 (67 y.o. M) Treating RN: Roger Shelter Primary Care Provider: Benita Stabile Other Clinician: Referring Provider: Referral, Self Treating Provider/Extender: Melburn Hake, HOYT Weeks in Treatment: 0 Active Problems ICD-10 Impacting Encounter Code Description Active Date Wound Healing Diagnosis E11.621 Type 2 diabetes mellitus with foot ulcer 07/06/2017 No Yes L97.512 Non-pressure chronic ulcer of other part of right foot with fat 07/06/2017 No Yes layer exposed I10 Essential (primary) hypertension 07/06/2017 No Yes I48.2 Chronic atrial fibrillation 07/06/2017 No Yes Inactive Problems Resolved Problems Electronic Signature(s) Signed: 07/06/2017 1:56:08 AM By: Worthy Keeler PA-C Entered By: Worthy Keeler on 07/06/2017 01:43:00 Summa, [name] ([id number]) -------------------------------------------------------------------------------- Progress Note Details Patient Name: [name]. Date of Service: 07/05/2017 9:45 AM Medical Record Number: [id number] Patient Account Number: [id number] Date of Birth/Sex: 08/09/1950 (67 y.o. M) Treating RN: Roger Shelter Primary Care Provider: Benita Stabile Other Clinician: Referring Provider: Referral, Self Treating Provider/Extender: STONE III, HOYT Weeks in Treatment: 0 Subjective Chief Complaint Information obtained from Patient Right plantar foot ulcer History of Present Illness (HPI) The following HPI elements were documented for the patient's wound: Associated Signs and Symptoms: Patient has a history of diabetes mellitus type II, hypertension, and atrial fibrillation. He also has a hammertoe deformity of the bilateral feet which seems to be causing pressure in the  ball of his foot. 03/09/17 on evaluation today patient presents for his initial evaluation concerning an ulcer on the plantar aspect of his right foot which has been open he tells me for about three weeks. He has been seen at [facility] where they did perform debridement it appears according to a note on 02/22/17 where unfortunately it appears that his callous area began to break down into an ulcer after modifications have  been added to his insulin. This had happened previously as well. However I do not have details of the severity of debridement at that point although it does not sound as if you the debridement was too significant based on what the patient is telling me that he still had a lot of callous following debridement. With that being said they were going to look into altering his insoles to try to prevent further breakdown in the future. Nonetheless he has had foul odor discharge coming from the ulcer which is noted all the way back to that visit [facility]. Patient states that this was concerning him more than anything else. He does have diabetes although he described this as "borderline diabetes" he is on metoprolol however along with lisinopril. Patient is not having any issues with pain in regard to his right plantar foot. 03/16/17 on evaluation today patient appears to be doing much better in regard to his plantar foot ulcer. He actually tells me that he loves the peg assist offloading shoe and that he hasn't had any pain in the ulcer area from the callous since I worked on it last week. Overall he is extremely happy with how things have progressed. Likewise the wound bed has no slough noted there is no evidence of infection and it looks excellent. I did receive the results of his hemoglobin A1c which showed a value of 5.9 which was elevated but actually rather well. Subsequently I did also receive the x-ray of his foot which showed diffuse degenerative change  but no underlying acute bony abnormality. 03/23/17 on evaluation today patient appears to be doing better in regard to his right plantar foot ulcer. He continues to show signs of improvement there's definitely not as much drainage at this point. He has been tolerating the dressing changes without complication he is not happy with the peg assist offloading shoe but at the same time I do believe that it is making good progress as far as offloading is concerned. I still believe he may need to talk to a surgeon about surgically correcting a hammertoe in order to avoid additional pressure to the site especially since he Artie has diabetic shoes and they do not seem to have prevented callous buildup in ulcer formation. 03/30/17 on evaluation today patient appears to be doing better in regard to his right plantar foot ulcer. Continues to show signs of improvement which is good news. Unfortunately we were unable to get the appointment with the orthopedic, Dr. Doran Durand, whom I recommended for him due to patient having a balance at the practice. With that being said patient's foot ulcer does seem to be doing much better on evaluation today he still has some depth to the wound but overall we are seeing improvement and epithelialization week by week. Hopefully this is something that will close shortly. 04/05/17 on evaluation today patient unfortunately has what appears to be an area of the plantar surface of his ulcer where he had fluid collection where the callous grew over top of the wound bed. Unfortunately there was some pus like material noted during cleansing that we did sent for culture today. Hopefully this is not truly an infection is or does not appear to be any SAMMIE, DENNER. ([id number]) evidence of erythema surrounding and maybe this is just simply a small setback with a fluid collection that has caused this issue. Nonetheless we will see what that shows when we get the culture back. I am also going  to  have it completely antibiotic which I previously placed him on anyway which will hopefully prevent any true infection from setting in. 04/12/17 on evaluation today patient's ulcer on the plantar aspect of his right foot actually appears to be better then during last weeks evaluation. In general he has been tolerating the dressing changes in utilizing the offloading shoe which does seem to be beneficial for him. With that being said he still seems to get some pressure to the area just not nearly as much as he previously had noted. Again fortunately there is no significant pain. 04/19/17 on evaluation today patient appears to be doing excellent in regard to his right foot ulcer. He has been tolerating the dressing changes without complication. There really has not been any drainage over the past few days he tells me. With that being said he seems to be doing excellent in my opinion at this point he does have some callous buildup. 04/26/17 On evaluation today patient's wound appears to be completely healed which is great news. He has been tolerating the dressing changes without complication and I do think he has done very well in regard to the healing process and offloading he has listed everything that that we instructed him to do. Obviously this appears to have paid off and he has progressed very nicely. Readmission: 07/05/17 on evaluation today patient is seen for fault evaluation and our clinic concerning the same issue that I have previously treated him for ending back in April 2019. This is a callous region with subsequently an ulcer underneath this on the plantar aspect of his right second metatarsal region. Fortunately he's not having any significant discomfort although his girlfriend told him that she noted an area of bruising at the site underneath the callous and wanted him to come get this checked out. He was very please with our care previous and therefore was more than happy to come let us  check this for him. Upon inspection initially he did have significant callous overlying the area in question. It was not easily identifiable as far as any ulcer was concerned. With that being said he with this significant callous did require some sharp debridement and following debridement there did appear to be a small ulcer underlying. Fortunately patient in general shows no signs of infection at this point. He again has neuropathy and therefore does not have any ongoing discomfort or pain at this point. Wound History Patient reportedly has not tested positive for osteomyelitis. Patient reportedly has not had testing performed to evaluate circulation in the legs. Patient History Information obtained from Patient. Allergies penicillin Family History Cancer - Siblings, Diabetes - Father, Heart Disease - Mother, Hypertension - Mother, No family history of Hereditary Spherocytosis, Kidney Disease, Lung Disease, Seizures, Stroke, Thyroid Problems, Tuberculosis. Social History Former smoker, Marital Status - Single, Alcohol Use - Never, Drug Use - No History, Caffeine Use - Moderate. Medical History Endocrine Denies history of Type I Diabetes Patient is treated with Oral Agents. Blood sugar is not tested. Review of Systems (ROS) Constitutional Symptoms (General Health) The patient has no complaints or symptoms. Eyes LLEWELLYN, CHOPLIN. ([id number]) The patient has no complaints or symptoms. Ear/Nose/Mouth/Throat The patient has no complaints or symptoms. Hematologic/Lymphatic The patient has no complaints or symptoms. Respiratory The patient has no complaints or symptoms. Cardiovascular hyperlipidemia Gastrointestinal The patient has no complaints or symptoms. Endocrine Complains or has symptoms of Thyroid disease. Denies complaints or symptoms of Hepatitis, Polydypsia (Excessive Thirst). Genitourinary The patient has  no complaints or symptoms. Immunological The patient has no  complaints or symptoms. Integumentary (Skin) Complains or has symptoms of Wounds. Psychiatric The patient has no complaints or symptoms. Objective Constitutional patient is hypertensive.. pulse regular and within target range for patient.[name] respirations regular, non-labored and within target range for patient.[name] temperature within target range for patient.. Well-nourished and well-hydrated in no acute distress. Vitals Time Taken: 9:50 AM, Height: 73 in, Source: Stated, Weight: 243 lbs, Source: Measured, BMI: 32.1, Temperature: 97.9 F, Pulse: 65 bpm, Respiratory Rate: 18 breaths/min, Blood Pressure: 157/84 mmHg. Eyes conjunctiva clear no eyelid edema noted. pupils equal round and reactive to light and accommodation. Ears, Nose, Mouth, and Throat no gross abnormality of ear auricles or external auditory canals. normal hearing noted during conversation. mucus membranes moist. Respiratory normal breathing without difficulty. clear to auscultation bilaterally. Cardiovascular regular rate and rhythm with normal S1, S2. no clubbing, cyanosis, significant edema, Gastrointestinal (GI) soft, non-tender, non-distended, +BS. no ventral hernia noted. Musculoskeletal normal gait and posture. no significant deformity or arthritic changes, no loss or range of motion, no clubbing. RAYHAAN, HUSTER ([id number]) Psychiatric this patient is able to make decisions and demonstrates good insight into disease process. Alert and Oriented x 3. pleasant and cooperative. General Notes: Upon inspection I did perform quite sniff get debridement of the callous at this point which the patient tolerated without complication there is no pain and really only minimal bleeding at the very end when I got to the area which actually did rebuild and show that the patient had a small ulceration on the plantar aspect of the foot. With that being said he overall I think will do very well with this it's definitely not as  significant as when I last saw him he had to have a much more significant and extensive debridement at that point. Integumentary (Hair, Skin) Wound #2 status is Open. Original cause of wound was Gradually Appeared. The wound is located on the Right Metatarsal head second. The wound measures 0.4cm length x 0.3cm width x 0.1cm depth; 0.094cm^2 area and 0.009cm^3 volume. There is Fat Layer (Subcutaneous Tissue) Exposed exposed. There is no tunneling or undermining noted. There is a medium amount of sanguinous drainage noted. The wound margin is flat and intact. There is large (67-100%) red granulation within the wound bed. There is a small (1-33%) amount of necrotic tissue within the wound bed including Eschar and Adherent Slough. The periwound skin appearance did not exhibit: Callus, Crepitus, Excoriation, Induration, Rash, Scarring, Dry/Scaly, Maceration, Atrophie Blanche, Cyanosis, Ecchymosis, Hemosiderin Staining, Mottled, Pallor, Rubor, Erythema. 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 There was a Excisional Skin/Subcutaneous Tissue Debridement with a total area of 0.12 sq cm performed by STONE III, HOYT E., PA-C. With the following instrument(s): Curette to remove Viable and Non-Viable tissue/material. Material removed includes Callus, Subcutaneous Tissue, and Slough after achieving pain control using Lidocaine 4% Topical Solution. No specimens were taken. A time out was conducted at 10:30, prior to the start of the procedure. A Minimum amount of bleeding was controlled with Pressure. The procedure was tolerated  well with a pain level of Insensate throughout and a pain level of Insensate following the  procedure. Patient s Level of Consciousness post procedure was recorded as Awake and Alert. Post Debridement Measurements: 0.4cm length x 0.03cm width x 0.2cm depth; 0.002cm^3 volume. Character of Wound/Ulcer Post Debridement is improved. Severity of Tissue Post Debridement is: Fat layer exposed. Post procedure Diagnosis Wound #2: Same as Pre-Procedure Heroux, Atif J. ([id number]) Plan Wound Cleansing: Wound #2 Right Metatarsal head second: Clean wound with Normal Saline. Anesthetic (add to Medication List): Wound #2 Right Metatarsal head second: Topical Lidocaine 4% cream applied to wound bed prior to debridement (In Clinic Only). Primary Wound Dressing: Wound #2 Right Metatarsal head second: Silver Collagen Secondary Dressing: Boardered Foam Dressing Dressing Change Frequency: Wound #2 Right Metatarsal head second: Change dressing every other day. Follow-up Appointments: Wound #2 Right Metatarsal head second: Return Appointment in 1 week. I am going to suggest currently that we go ahead and proceed with the above wound care orders for the next week I think this is likely going to be the best option for [place] in order to get this to close and do well. The patient is in agreement with the plan. We will subsequently see were things stand at follow-up. Please see above for specific wound care orders. We will see patient for re-evaluation in 1 week(s) here in the clinic. If anything worsens or changes patient will contact our office for additional recommendations. Electronic Signature(s) Signed: 07/06/2017 5:57:54 PM By: Worthy Keeler PA-C Entered By: Worthy Keeler on 07/06/2017 17:57:22 Dudziak, [name] ([id number]) -------------------------------------------------------------------------------- ROS/PFSH Details Patient Name: [name] Date of Service: 07/05/2017 9:45 AM Medical Record  Number: [id number] Patient Account Number: [id number] Date of Birth/Sex: [birth year] (67 y.o. M) Treating RN: Ahmed Prima Primary Care Provider: Benita Stabile Other Clinician: Referring Provider: Referral, Self Treating Provider/Extender: STONE III, HOYT Weeks in Treatment: 0 Information Obtained From Patient Wound History Do you currently have one or more open woundso Yes Approximately how long have you had your woundso 2 weeks How have you been treating your wound(s) until nowo nothing Has your wound(s) ever healed and then re-openedo No Have you had any lab work done in the past montho No Have you tested positive for osteomyelitis (bone infection)o No Have you had any tests for circulation on your legso No Endocrine Complaints and Symptoms: Positive for: Thyroid disease Negative for: Hepatitis; Polydypsia (Excessive Thirst) Medical History: Positive for: Type II Diabetes Negative for: Type I Diabetes Treated with: Oral agents Blood sugar tested every day: No Integumentary (Skin) Complaints and Symptoms: Positive for: Wounds Medical History: Negative for: History of Burn; History of pressure wounds Constitutional Symptoms (General Health) Complaints and Symptoms: No Complaints or Symptoms Eyes Complaints and Symptoms: No Complaints or Symptoms Medical History: Negative for: Cataracts; Glaucoma; Optic Neuritis Ear/Nose/Mouth/Throat Complaints and Symptoms: No Complaints or Symptoms CAIDAN, HUBBERT. ([id number]) Medical History: Negative for: Chronic sinus problems/congestion; Middle ear problems Hematologic/Lymphatic Complaints and Symptoms: No Complaints or Symptoms Medical History: Negative for: Anemia; Hemophilia; Human Immunodeficiency Virus; Lymphedema; Sickle Cell Disease Respiratory Complaints and Symptoms: No Complaints or Symptoms Medical History: Negative for: Aspiration; Asthma; Chronic Obstructive Pulmonary Disease (COPD); Pneumothorax; Sleep  Apnea; Tuberculosis Cardiovascular Complaints and Symptoms: Review of System Notes: hyperlipidemia Medical History: Positive for: Arrhythmia - a fib; Hypertension Negative for: Angina; Congestive Heart Failure; Coronary Artery Disease; Deep Vein Thrombosis; Hypotension; Myocardial Infarction; Peripheral Arterial Disease; Peripheral Venous Disease; Phlebitis; Vasculitis Gastrointestinal Complaints and Symptoms: No Complaints or Symptoms Medical History: Negative for: Cirrhosis ; Colitis; Crohnos; Hepatitis A; Hepatitis B; Hepatitis C Genitourinary  Complaints and Symptoms: No Complaints or Symptoms Immunological Complaints and Symptoms: No Complaints or Symptoms Medical History: Negative for: Lupus Erythematosus; Raynaudos; Scleroderma Musculoskeletal Medical History: Positive for: Osteoarthritis Negative for: Gout; Rheumatoid Arthritis; Osteomyelitis Neurologic Krauser, Mouhamad J. ([id number]) Medical History: Negative for: Dementia; Neuropathy Oncologic Medical History: Negative for: Received Chemotherapy; Received Radiation Psychiatric Complaints and Symptoms: No Complaints or Symptoms Immunizations Pneumococcal Vaccine: Received Pneumococcal Vaccination: Yes Immunization Notes: up to date Implantable Devices Family and Social History Cancer: Yes - Siblings; Diabetes: Yes - Father; Heart Disease: Yes - Mother; Hereditary Spherocytosis: No; Hypertension: Yes - Mother; Kidney Disease: No; Lung Disease: No; Seizures: No; Stroke: No; Thyroid Problems: No; Tuberculosis: No; Former smoker; Marital Status - Single; Alcohol Use: Never; Drug Use: No History; Caffeine Use: Moderate; Financial Concerns: No; Food, Clothing or Shelter Needs: No; Support System Lacking: No; Transportation Concerns: No; Advanced Directives: No; Patient does not want information on Advanced Directives; Do not resuscitate: No; Living Will: No; Medical Power of Attorney: No Electronic  Signature(s) Signed: 07/05/2017 5:21:58 PM By: Alric Quan Signed: 07/06/2017 1:56:08 AM By: Worthy Keeler PA-C Entered By: Alric Quan on 07/05/2017 09:58:41 Hellard, [name] ([id number]) -------------------------------------------------------------------------------- SuperBill Details Patient Name: [name]. Date of Service: 07/05/2017 Medical Record Number: [id number] Patient Account Number: [id number] Date of Birth/Sex: [birth year] (67 y.o. M) Treating RN: Roger Shelter Primary Care Provider: Benita Stabile Other Clinician: Referring Provider: Referral, Self Treating Provider/Extender: Melburn Hake, HOYT Weeks in Treatment: 0 Diagnosis Coding ICD-10 Codes Code Description E11.621 Type 2 diabetes mellitus with foot ulcer L97.512 Non-pressure chronic ulcer of other part of right foot with fat layer exposed I10 Essential (primary) hypertension I48.2 Chronic atrial fibrillation Facility Procedures CPT4 Code: 42353614 Description: 43154 - WOUND CARE VISIT-LEV 2 EST PT Modifier: Quantity: 1 CPT4 Code: 00867619 Description: 11042 - DEB SUBQ TISSUE 20 SQ CM/< ICD-10 Diagnosis Description L97.512 Non-pressure chronic ulcer of other part of right foot with fa Modifier: t layer exposed Quantity: 1 Physician Procedures CPT4 Code: 5093267 Description: 99214 - WC PHYS LEVEL 4 - EST PT ICD-10 Diagnosis Description E11.621 Type 2 diabetes mellitus with foot ulcer L97.512 Non-pressure chronic ulcer of other part of right foot with fa I10 Essential (primary) hypertension I48.2 Chronic atrial  fibrillation Modifier: 25 t layer exposed Quantity: 1 CPT4 Code: [id number] Description: 11042 - WC PHYS SUBQ TISS 20 SQ CM ICD-10 Diagnosis Description L97.512 Non-pressure chronic ulcer of other part of right foot with fa Modifier: t layer exposed Quantity: 1 Electronic Signature(s) Signed: 07/06/2017 12:50:22 PM By: Roger Shelter Signed: 07/06/2017 5:57:54 PM By: Worthy Keeler  PA-C Previous Signature: 07/06/2017 1:56:08 AM Version By: Worthy Keeler PA-C Entered By: Roger Shelter on 07/06/2017 12:49:56

## 2017-07-12 ENCOUNTER — Encounter: Payer: PPO | Admitting: Physician Assistant

## 2017-07-12 DIAGNOSIS — E11621 Type 2 diabetes mellitus with foot ulcer: Secondary | ICD-10-CM | POA: Diagnosis not present

## 2017-07-12 DIAGNOSIS — L97512 Non-pressure chronic ulcer of other part of right foot with fat layer exposed: Secondary | ICD-10-CM | POA: Diagnosis not present

## 2017-07-13 NOTE — Progress Notes (Signed)
[name] ([id number]) Visit Report for 07/12/2017 Arrival Information Details Patient Name: [name], [name]. Date of Service: 07/12/2017 11:00 AM Medical Record Number: [id number] Patient Account Number: [id number] Date of Birth/Sex: [birth year] (67 y.o. M) Treating RN: Montey Hora Primary Care Zari Cly: Benita Stabile Other Clinician: Referring [name]: Benita Stabile Treating Bohdi Leeds/Extender: Melburn Hake, HOYT Weeks in Treatment: 1 Visit Information History Since Last Visit Added or deleted any medications: No Patient Arrived: Ambulatory Any new allergies or adverse reactions: No Arrival Time: 10:59 Had a fall or experienced change in No Accompanied By: self activities of daily living that may affect Transfer Assistance: None risk of falls: Patient Identification Verified: Yes Signs or symptoms of abuse/neglect since last visito No Secondary Verification Process Completed: Yes Hospitalized since last visit: No Patient Requires Transmission-Based No Implantable device outside of the clinic excluding No Precautions: cellular tissue based products placed in the center Patient Has Alerts: Yes since last visit: Has Dressing in Place as Prescribed: Yes Pain Present Now: No Electronic Signature(s) Signed: 07/12/2017 4:53:29 PM By: Montey Hora Entered By: Montey Hora on 07/12/2017 10:59:43 Cicero, [name] ([id number]) -------------------------------------------------------------------------------- Clinic Level of Care Assessment Details Patient Name: [name]. Date of Service: 07/12/2017 11:00 AM Medical Record Number: [id number] Patient Account Number: [id number] Date of Birth/Sex: [birth year] (67 y.o. M) Treating RN: Roger Shelter Primary Care Angelice Piech: [name] Other Clinician: Referring Annalynne Ibanez: [name] Treating [name]/Extender: Melburn Hake, HOYT Weeks in Treatment: 1 Clinic Level of Care Assessment Items TOOL 4 Quantity Score X - Use when only an  EandM is performed on FOLLOW-UP visit 1 0 ASSESSMENTS - Nursing Assessment / Reassessment X - Reassessment of Co-morbidities (includes updates in patient status) 1 10 X- 1 5 Reassessment of Adherence to Treatment Plan ASSESSMENTS - Wound and Skin Assessment / Reassessment X - Simple Wound Assessment / Reassessment - one wound 1 5 []  - 0 Complex Wound Assessment / Reassessment - multiple wounds []  - 0 Dermatologic / Skin Assessment (not related to wound area) ASSESSMENTS - Focused Assessment []  - Circumferential Edema Measurements - multi extremities 0 []  - 0 Nutritional Assessment / Counseling / Intervention []  - 0 Lower Extremity Assessment (monofilament, tuning fork, pulses) []  - 0 Peripheral Arterial Disease Assessment (using hand held doppler) ASSESSMENTS - Ostomy and/or Continence Assessment and Care []  - Incontinence Assessment and Management 0 []  - 0 Ostomy Care Assessment and Management (repouching, etc.) PROCESS - Coordination of Care X - Simple Patient / Family Education for ongoing care 1 15 []  - 0 Complex (extensive) Patient / Family Education for ongoing care []  - 0 Staff obtains Programmer, systems, Records, Test Results / Process Orders []  - 0 Staff telephones HHA, Nursing Homes / Clarify orders / etc []  - 0 Routine Transfer to another Facility (non-emergent condition) []  - 0 Routine Hospital Admission (non-emergent condition) []  - 0 New Admissions / Biomedical engineer / Ordering NPWT, Apligraf, etc. []  - 0 Emergency Hospital Admission (emergent condition) X- 1 10 Simple Discharge Coordination [name]. ([id number]) []  - 0 Complex (extensive) Discharge Coordination PROCESS - Special Needs []  - Pediatric / Minor Patient Management 0 []  - 0 Isolation Patient Management []  - 0 Hearing / Language / Visual special needs []  - 0 Assessment of Community assistance (transportation, D/C planning, etc.) []  - 0 Additional assistance / Altered mentation []  -  0 Support Surface(s) Assessment (bed, cushion, seat, etc.) INTERVENTIONS - Wound Cleansing / Measurement X - Simple Wound Cleansing - one wound 1 5 []  - 0 Complex  Wound Cleansing - multiple wounds X- 1 5 Wound Imaging (photographs - any number of wounds) []  - 0 Wound Tracing (instead of photographs) X- 1 5 Simple Wound Measurement - one wound []  - 0 Complex Wound Measurement - multiple wounds INTERVENTIONS - Wound Dressings X - Small Wound Dressing one or multiple wounds 1 10 []  - 0 Medium Wound Dressing one or multiple wounds []  - 0 Large Wound Dressing one or multiple wounds []  - 0 Application of Medications - topical []  - 0 Application of Medications - injection INTERVENTIONS - Miscellaneous []  - External ear exam 0 []  - 0 Specimen Collection (cultures, biopsies, blood, body fluids, etc.) []  - 0 Specimen(s) / Culture(s) sent or taken to Lab for analysis []  - 0 Patient Transfer (multiple staff / Civil Service fast streamer / Similar devices) []  - 0 Simple Staple / Suture removal (25 or less) []  - 0 Complex Staple / Suture removal (26 or more) []  - 0 Hypo / Hyperglycemic Management (close monitor of Blood Glucose) []  - 0 Ankle / Brachial Index (ABI) - do not check if billed separately X- 1 5 Vital Signs [name], [name] ([id number]) Has the patient been seen at the hospital within the last three years: Yes Total Score: 75 Level Of Care: New/Established - Level 2 Electronic Signature(s) Signed: 07/12/2017 4:53:36 PM By: Roger Shelter Entered By: Roger Shelter on 07/12/2017 11:45:36 [name] ([id number]) -------------------------------------------------------------------------------- Encounter Discharge Information Details Patient Name: [name]. Date of Service: 07/12/2017 11:00 AM Medical Record Number: [id number] Patient Account Number: [id number] Date of Birth/Sex: Apr 11, 1950 (67 y.o. M) Treating RN: Roger Shelter Primary Care Iline Buchinger: Benita Stabile Other  Clinician: Referring [name]: Benita Stabile Treating Xzayvion Vaeth/Extender: Melburn Hake, HOYT Weeks in Treatment: 1 Encounter Discharge Information Items Discharge Condition: Stable Ambulatory Status: Ambulatory Discharge Destination: Home Transportation: Private Auto Schedule Follow-up Appointment: No Clinical Summary of Care: Electronic Signature(s) Signed: 07/12/2017 4:53:36 PM By: Roger Shelter Entered By: Roger Shelter on 07/12/2017 11:46:24 Hairston, [name] ([id number]) -------------------------------------------------------------------------------- Lower Extremity Assessment Details Patient Name: [name], [name]. Date of Service: 07/12/2017 11:00 AM Medical Record Number: [id number] Patient Account Number: [id number] Date of Birth/Sex: 11-04-1950 (67 y.o. M) Treating RN: Montey Hora Primary Care Joey Lierman: Benita Stabile Other Clinician: Referring Orhan Mayorga: [name] Treating Keleigh Kazee/Extender: STONE III, HOYT Weeks in Treatment: 1 Vascular Assessment Pulses: Dorsalis Pedis Palpable: [Right:Yes] Posterior Tibial Extremity colors, hair growth, and conditions: Extremity Color: [Right:Normal] Hair Growth on Extremity: [Right:Yes] Temperature of Extremity: [Right:Warm] Capillary Refill: [Right:< 3 seconds] Toe Nail Assessment Left: Right: Thick: Yes Discolored: Yes Deformed: No Improper Length and Hygiene: No Electronic Signature(s) Signed: 07/12/2017 4:53:29 PM By: Montey Hora Entered By: Montey Hora on 07/12/2017 11:05:31 Tanori, [name] ([id number]) -------------------------------------------------------------------------------- Multi Wound Chart Details Patient Name: [name]. Date of Service: 07/12/2017 11:00 AM Medical Record Number: [id number] Patient Account Number: [id number] Date of Birth/Sex: Jun 06, 1950 (67 y.o. M) Treating RN: Roger Shelter Primary Care Mary Hockey: [name] Other Clinician: Referring Whittney Steenson: Benita Stabile Treating  Chania Kochanski/Extender: STONE III, HOYT Weeks in Treatment: 1 Vital Signs Height(in): 73 Pulse(bpm): 32 Weight(lbs): 243 Blood Pressure(mmHg): 135/59 Body Mass Index(BMI): 32 Temperature(F): 97.9 Respiratory Rate 18 (breaths/min): Photos: [N/A:N/A] Wound Location: Right Metatarsal head second N/A N/A Wounding Event: Gradually Appeared N/A N/A Primary Etiology: Diabetic Wound/Ulcer of the N/A N/A Lower Extremity Comorbid History: Arrhythmia, Hypertension, N/A N/A Type II Diabetes, Osteoarthritis Date Acquired: 06/28/2017 N/A N/A Weeks of Treatment: 1 N/A N/A Wound Status: Healed - Epithelialized N/A N/A Measurements L x W x  D 0x0x0 N/A N/A (cm) Area (cm) : 0 N/A N/A Volume (cm) : 0 N/A N/A % Reduction in Area: 100.00% N/A N/A % Reduction in Volume: 100.00% N/A N/A Classification: Grade 1 N/A N/A Exudate Amount: None Present N/A N/A Wound Margin: Flat and Intact N/A N/A Granulation Amount: None Present (0%) N/A N/A Necrotic Amount: None Present (0%) N/A N/A Exposed Structures: Fat Layer (Subcutaneous N/A N/A Tissue) Exposed: Yes Fascia: No Tendon: No Muscle: No Joint: No Bone: No Epithelialization: Large (67-100%) N/A N/A Hogate, Jarelle J. ([id number]) Periwound Skin Texture: Excoriation: No N/A N/A Induration: No Callus: No Crepitus: No Rash: No Scarring: No Periwound Skin Moisture: Maceration: No N/A N/A Dry/Scaly: No Periwound Skin Color: Atrophie Blanche: No N/A N/A Cyanosis: No Ecchymosis: No Erythema: No Hemosiderin Staining: No Mottled: No Pallor: No Rubor: No Tenderness on Palpation: No N/A N/A Wound Preparation: Ulcer Cleansing: N/A N/A Rinsed/Irrigated with Saline Topical Anesthetic Applied: Other: lidocaine 4% Treatment Notes Electronic Signature(s) Signed: 07/12/2017 4:53:36 PM By: Roger Shelter Entered By: Roger Shelter on 07/12/2017 11:44:02 Bogdanski, [name]  ([id number]) -------------------------------------------------------------------------------- Multi-Disciplinary Care Plan Details Patient Name: [name], [name]. Date of Service: 07/12/2017 11:00 AM Medical Record Number: [id number] Patient Account Number: [id number] Date of Birth/Sex: 09-Aug-1950 (67 y.o. M) Treating RN: Roger Shelter Primary Care Kierstynn Babich: Benita Stabile Other Clinician: Referring Lisbeth Puller: Benita Stabile Treating Marise Knapper/Extender: Melburn Hake, HOYT Weeks in Treatment: 1 Active Inactive Electronic Signature(s) Signed: 07/12/2017 4:53:36 PM By: Roger Shelter Entered By: Roger Shelter on 07/12/2017 12:06:25 Doane, [name] ([id number]) -------------------------------------------------------------------------------- Pain Assessment Details Patient Name: [name]. Date of Service: 07/12/2017 11:00 AM Medical Record Number: [id number] Patient Account Number: [id number] Date of Birth/Sex: 12-09-1950 (67 y.o. M) Treating RN: Montey Hora Primary Care Javonta Gronau: Benita Stabile Other Clinician: Referring Nikeia Henkes: Benita Stabile Treating Torrie Namba/Extender: STONE III, HOYT Weeks in Treatment: 1 Active Problems Location of Pain Severity and Description of Pain Patient Has Paino No Site Locations Pain Management and Medication Current Pain Management: Electronic Signature(s) Signed: 07/12/2017 4:53:29 PM By: Montey Hora Entered By: Montey Hora on 07/12/2017 10:59:50 Moening, [name] ([id number]) -------------------------------------------------------------------------------- Patient/Caregiver Education Details Patient Name: [name] Date of Service: 07/12/2017 11:00 AM Medical Record Number: [id number] Patient Account Number: [id number] Date of Birth/Gender: [birth year] (67 y.o. M) Treating RN: Roger Shelter Primary Care Physician: Benita Stabile Other Clinician: Referring Physician: Benita Stabile Treating Physician/Extender: Sharalyn Ink in Treatment:  1 Education Assessment Education Provided To: Patient Education Topics Provided Wound/Skin Impairment: Handouts: Skin Care Do's and Dont's Methods: Explain/Verbal Responses: State content correctly Electronic Signature(s) Signed: 07/12/2017 4:53:36 PM By: Roger Shelter Entered By: Roger Shelter on 07/12/2017 11:47:12 Godeaux, [name] ([id number]) -------------------------------------------------------------------------------- Wound Assessment Details Patient Name: [name]. Date of Service: 07/12/2017 11:00 AM Medical Record Number: [id number] Patient Account Number: [id number] Date of Birth/Sex: 11-02-1950 (67 y.o. M) Treating RN: Roger Shelter Primary Care Makilah Dowda: Benita Stabile Other Clinician: Referring Kennesha Brewbaker: Benita Stabile Treating Amazing Cowman/Extender: STONE III, HOYT Weeks in Treatment: 1 Wound Status Wound Number: 2 Primary Diabetic Wound/Ulcer of the Lower Extremity Etiology: Wound Location: Right Metatarsal head second Wound Status: Healed - Epithelialized Wounding Event: Gradually Appeared Comorbid Arrhythmia, Hypertension, Type II Diabetes, Date Acquired: 06/28/2017 History: Osteoarthritis Weeks Of Treatment: 1 Clustered Wound: No Photos Photo Uploaded By: Montey Hora on 07/12/2017 11:12:32 Wound Measurements Length: (cm) 0 Width: (cm) 0 Depth: (cm) 0 Area: (cm) 0 Volume: (cm) 0 % Reduction in Area: 100% % Reduction in Volume: 100% Epithelialization: Large (67-100%) Tunneling: No Undermining: No  Wound Description Classification: Grade 1 Foul Wound Margin: Flat and Intact Sloug Exudate Amount: None Present Odor After Cleansing: No h/Fibrino Yes Wound Bed Granulation Amount: None Present (0%) Exposed Structure Necrotic Amount: None Present (0%) Fascia Exposed: No Fat Layer (Subcutaneous Tissue) Exposed: Yes Tendon Exposed: No Muscle Exposed: No Joint Exposed: No Bone Exposed: No Periwound Skin Texture Texture Color No  Abnormalities Noted: No No Abnormalities Noted: No Persaud, Jailen J. ([id number]) Callus: No Atrophie Blanche: No Crepitus: No Cyanosis: No Excoriation: No Ecchymosis: No Induration: No Erythema: No Rash: No Hemosiderin Staining: No Scarring: No Mottled: No Pallor: No Moisture Rubor: No No Abnormalities Noted: No Dry / Scaly: No Maceration: No Wound Preparation Ulcer Cleansing: Rinsed/Irrigated with Saline Topical Anesthetic Applied: Other: lidocaine 4%, Electronic Signature(s) Signed: 07/12/2017 4:53:36 PM By: Roger Shelter Entered By: Roger Shelter on 07/12/2017 11:43:40 Swader, [name] ([id number]) -------------------------------------------------------------------------------- Vitals Details Patient Name: [name]. Date of Service: 07/12/2017 11:00 AM Medical Record Number: [id number] Patient Account Number: [id number] Date of Birth/Sex: 1950/05/13 (67 y.o. M) Treating RN: Montey Hora Primary Care Kaysan Peixoto: Benita Stabile Other Clinician: Referring Jamera Vanloan: [name] Treating Dalana Pfahler/Extender: STONE III, HOYT Weeks in Treatment: 1 Vital Signs Time Taken: 11:01 Temperature (F): 97.9 Height (in): 73 Pulse (bpm): 77 Weight (lbs): 243 Respiratory Rate (breaths/min): 18 Body Mass Index (BMI): 32.1 Blood Pressure (mmHg): 135/59 Reference Range: 80 - 120 mg / dl Electronic Signature(s) Signed: 07/12/2017 4:53:29 PM By: Montey Hora Entered By: Montey Hora on 07/12/2017 11:01:14

## 2017-07-14 NOTE — Progress Notes (Signed)
[name] ([id number]) Visit Report for 07/12/2017 Chief Complaint Document Details Patient Name: [name]. Date of Service: 07/12/2017 11:00 AM Medical Record Number: [id number] Patient Account Number: [id number] Date of Birth/Sex: 1950-12-08 (67 y.o. M) Treating RN: [name] Primary Care Provider: Benita [name] Other Clinician: Referring Provider: Benita [name] Treating Provider/Extender: [name], [name] in Treatment: 1 Information Obtained from: Patient Chief Complaint Right plantar foot ulcer Electronic Signature(s) Signed: 07/12/2017 11:25:57 PM By: [name] Keeler PA-C Entered By: [name] on 07/12/2017 11:03:20 [name], [name] ([id number]) -------------------------------------------------------------------------------- HPI Details Patient Name: [name]. Date of Service: 07/12/2017 11:00 AM Medical Record Number: [id number] Patient Account Number: [id number] Date of Birth/Sex: 01-05-1951 (67 y.o. M) Treating RN: [name] Primary Care Provider: Benita [name] Other Clinician: Referring Provider: TATE, Sharlet [name] Treating Provider/Extender: [name] [name] in Treatment: 1 History of Present Illness Associated Signs and Symptoms: Patient has a history of diabetes mellitus type II, hypertension, and atrial fibrillation. He also has a hammertoe deformity of the bilateral feet which seems to be causing pressure in the ball of his foot. HPI Description: 03/09/17 on evaluation today patient presents for his initial evaluation concerning an ulcer on the plantar aspect of his right foot which has been open he tells me for about three [name]. He has been seen at [facility] where they did perform debridement it appears according to a note on 02/22/17 where unfortunately it appears that his callous area began to break down into an ulcer after modifications have been added to his insulin. This had happened previously as well. However I do not have  details of the severity of debridement at that point although it does not sound as if you the debridement was too significant based on what the patient is telling me that he still had a lot of callous following debridement. With that being said they were going to look into altering his insoles to try to prevent further breakdown in the future. Nonetheless he has had foul odor discharge coming from the ulcer which is noted all the way back to that visit [facility]. Patient states that this was concerning him more than anything else. He does have diabetes although he described this as "borderline diabetes" he is on metoprolol however along with lisinopril. Patient is not having any issues with pain in regard to his right plantar foot. 03/16/17 on evaluation today patient appears to be doing much better in regard to his plantar foot ulcer. He actually tells me that he loves the peg assist offloading shoe and that he hasn't had any pain in the ulcer area from the callous since I worked on it last week. Overall he is extremely happy with how things have progressed. Likewise the wound bed has no slough noted there is no evidence of infection and it looks excellent. I did receive the results of his hemoglobin A1c which showed a value of 5.9 which was elevated but actually rather well. Subsequently I did also receive the x-ray of his foot which showed diffuse degenerative change but no underlying acute bony abnormality. 03/23/17 on evaluation today patient appears to be doing better in regard to his right plantar foot ulcer. He continues to show signs of improvement there's definitely not as much drainage at this point. He has been tolerating the dressing changes without complication he is not happy with the peg assist offloading shoe but at the same time I do believe that it  is making good progress as far as offloading is concerned. I still believe he may need to talk to a surgeon about  surgically correcting a hammertoe in order to avoid additional pressure to the site especially since he Artie has diabetic shoes and they do not seem to have prevented callous buildup in ulcer formation. 03/30/17 on evaluation today patient appears to be doing better in regard to his right plantar foot ulcer. Continues to show signs of improvement which is good news. Unfortunately we were unable to get the appointment with the orthopedic, [name], whom I recommended for him due to patient having a balance at the practice. With that being said patient's foot ulcer does seem to be doing much better on evaluation today he still has some depth to the wound but overall we are seeing improvement and epithelialization week by week. Hopefully this is something that will close shortly. 04/05/17 on evaluation today patient unfortunately has what appears to be an area of the plantar surface of his ulcer where he had fluid collection where the callous grew over top of the wound bed. Unfortunately there was some pus like material noted during cleansing that we did sent for culture today. Hopefully this is not truly an infection is or does not appear to be any evidence of erythema surrounding and maybe this is just simply a small setback with a fluid collection that has caused this issue. Nonetheless we will see what that shows when we get the culture back. I am also going to have it completely antibiotic which I previously placed him on anyway which will hopefully prevent any true infection from setting in. 04/12/17 on evaluation today patient's ulcer on the plantar aspect of his right foot actually appears to be better then during last [name] evaluation. In general he has been tolerating the dressing changes in utilizing the offloading shoe which does seem to be beneficial for him. With that being said he still seems to get some pressure to the area just not nearly as much as he previously had noted. Again  fortunately there is no significant pain. [name] ([id number]) 04/19/17 on evaluation today patient appears to be doing excellent in regard to his right foot ulcer. He has been tolerating the dressing changes without complication. There really has not been any drainage over the past few days he tells me. With that being said he seems to be doing excellent in my opinion at this point he does have some callous buildup. 04/26/17 On evaluation today patient's wound appears to be completely healed which is great news. He has been tolerating the dressing changes without complication and I do think he has done very well in regard to the healing process and offloading he has listed everything that that we instructed him to do. Obviously this appears to have paid off and he has progressed very nicely. Readmission: 07/05/17 on evaluation today patient is seen for fault evaluation and our clinic concerning the same issue that I have previously treated him for ending back in April 2019. This is a callous region with subsequently an ulcer underneath this on the plantar aspect of his right second metatarsal region. Fortunately he's not having any significant discomfort although his girlfriend told him that she noted an area of bruising at the site underneath the callous and wanted him to come get this checked out. He was very please with our care previous and therefore was more than happy to come let us check this for him.  Upon inspection initially he did have significant callous overlying the area in question. It was not easily identifiable as far as any ulcer was concerned. With that being said he with this significant callous did require some sharp debridement and following debridement there did appear to be a small ulcer underlying. Fortunately patient in general shows no signs of infection at this point. He again has neuropathy and therefore does not have any ongoing discomfort or pain at this  point. 07/12/17 on evaluation today patient actually appears to be doing very well in regard to his plantar foot ulcer. In fact this appears to be completely healed and there's no residual opening at this time. Overall I'm very pleased in this regard. Patient also is extremely pleased. Electronic Signature(s) Signed: 07/12/2017 11:25:57 PM By: [name] Keeler PA-C Entered By: [name] on 07/12/2017 16:58:49 [name], [name] ([id number]) -------------------------------------------------------------------------------- Physical Exam Details Patient Name: [name], [name]. Date of Service: 07/12/2017 11:00 AM Medical Record Number: [id number] Patient Account Number: [id number] Date of Birth/Sex: 10-03-1950 (67 y.o. M) Treating RN: [name] Primary Care Provider: Benita [name] Other Clinician: Referring Provider: TATE, Sharlet [name] Treating Provider/Extender: [name] III, Hadasa Gasner [name] in Treatment: 1 Constitutional Well-nourished and well-hydrated in no acute distress. Respiratory normal breathing without difficulty. Psychiatric this patient is able to make decisions and demonstrates good insight into disease process. Alert and Oriented x 3. pleasant and cooperative. Notes I'm happy that everything seems to be progressing nicely at this time and again he has no evidence of infection open wounds and minimal callous. Electronic Signature(s) Signed: 07/12/2017 11:25:57 PM By: [name] Keeler PA-C Entered By: [name] on 07/12/2017 16:59:24 Lafave, [name] ([id number]) -------------------------------------------------------------------------------- Physician Orders Details Patient Name: [name], [name]. Date of Service: 07/12/2017 11:00 AM Medical Record Number: [id number] Patient Account Number: [id number] Date of Birth/Sex: 1950/12/15 (67 y.o. M) Treating RN: [name] Primary Care Provider: Benita [name] Other Clinician: Referring Provider: Benita [name] Treating  Provider/Extender: [name] III, Nicklos Gaxiola [name] in Treatment: 1 Verbal / Phone Orders: No Diagnosis Coding Wound Cleansing o Clean wound with Normal Saline. Anesthetic (add to Medication List) o Topical Lidocaine 4% cream applied to wound bed prior to debridement (In Clinic Only). Discharge From [facility] Services o Discharge from [facility] completed Electronic Signature(s) Signed: 07/12/2017 4:53:36 PM By: [name] Signed: 07/12/2017 11:25:57 PM By: [name] Keeler PA-C Entered By: [name] on 07/12/2017 11:45:10 Schwalm, [name] ([id number]) -------------------------------------------------------------------------------- Problem List Details Patient Name: [name], [name]. Date of Service: 07/12/2017 11:00 AM Medical Record Number: [id number] Patient Account Number: [id number] Date of Birth/Sex: 1950-03-07 (67 y.o. 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 Date of Service: 07/12/2017 11:00 AM Medical Record Number: [id number] Patient Account Number: [id number] Date of Birth/Sex: October 04, 1950 (67 y.o. M) Treating RN: [name] Primary Care Provider: Benita [name] Other Clinician: Referring Provider:  TATE, DENNY Treating Provider/Extender: [name] III, Fisher Hargadon [name] in Treatment: 1 Subjective Chief Complaint Information obtained from Patient Right plantar foot ulcer History of Present Illness (HPI) The following HPI elements were documented for the patient's wound: Associated Signs and Symptoms: Patient has a history of diabetes mellitus type II, hypertension, and atrial fibrillation. He also has a hammertoe deformity of the bilateral feet which seems to be causing pressure in the ball of his foot. 03/09/17 on evaluation today patient presents for his initial evaluation concerning an ulcer on the plantar aspect of his right foot which has been open he tells me for about three [name]. He has been seen at [facility] where they did perform debridement it appears according to a note on 02/22/17 where unfortunately it appears that his callous area began to break down into an ulcer after modifications have been added to his insulin. This had happened previously as well. However I do not have details of the severity of debridement at that point although it does not sound as if you the debridement was too significant based on what the patient is telling me that he still had a lot of callous following debridement. With that being said they were going to look into altering his insoles to try to prevent further breakdown in the future. Nonetheless he has had foul odor discharge coming from the ulcer which is noted all the way back to that visit [facility]. Patient states that this was concerning him more than anything else. He does have diabetes although he described this as "borderline diabetes" he is on metoprolol however along with lisinopril. Patient is not having any issues with pain in regard to his right plantar foot. 03/16/17 on evaluation today patient appears to be doing much better in regard to  his plantar foot ulcer. He actually tells me that he loves the peg assist offloading shoe and that he hasn't had any pain in the ulcer area from the callous since I worked on it last week. Overall he is extremely happy with how things have progressed. Likewise the wound bed has no slough noted there is no evidence of infection and it looks excellent. I did receive the results of his hemoglobin A1c which showed a value of 5.9 which was elevated but actually rather well. Subsequently I did also receive the x-ray of his foot which showed diffuse degenerative change but no underlying acute bony abnormality. 03/23/17 on evaluation today patient appears to be doing better in regard to his right plantar foot ulcer. He continues to show signs of improvement there's definitely not as much drainage at this point. He has been tolerating the dressing changes without complication he is not happy with the peg assist offloading shoe but at the same time I do believe that it is making good progress as far as offloading is concerned. I still believe he may need to talk to a surgeon about surgically correcting a hammertoe in order to avoid additional pressure to the site especially since he Artie has diabetic shoes and they do not seem to have prevented callous buildup in ulcer formation. 03/30/17 on evaluation today patient appears to be doing better in regard to his right plantar foot ulcer. Continues to show signs of improvement which is good news. Unfortunately we were unable to get the appointment with the orthopedic, [name], whom I recommended for him due to patient having a balance at the practice. With that being said patient's foot ulcer  does seem to be doing much better on evaluation today he still has some depth to the wound but overall we are seeing improvement and epithelialization week by week. Hopefully this is something that will close shortly. 04/05/17 on evaluation today patient unfortunately has  what appears to be an area of the plantar surface of his ulcer where he had fluid collection where the callous grew over top of the wound bed. Unfortunately there was some pus like material noted during cleansing that we did sent for culture today. Hopefully this is not truly an infection is or does not appear to be any DONALDO, [name]. ([id number]) evidence of erythema surrounding and maybe this is just simply a small setback with a fluid collection that has caused this issue. Nonetheless we will see what that shows when we get the culture back. I am also going to have it completely antibiotic which I previously placed him on anyway which will hopefully prevent any true infection from setting in. 04/12/17 on evaluation today patient's ulcer on the plantar aspect of his right foot actually appears to be better then during last [name] evaluation. In general he has been tolerating the dressing changes in utilizing the offloading shoe which does seem to be beneficial for him. With that being said he still seems to get some pressure to the area just not nearly as much as he previously had noted. Again fortunately there is no significant pain. 04/19/17 on evaluation today patient appears to be doing excellent in regard to his right foot ulcer. He has been tolerating the dressing changes without complication. There really has not been any drainage over the past few days he tells me. With that being said he seems to be doing excellent in my opinion at this point he does have some callous buildup. 04/26/17 On evaluation today patient's wound appears to be completely healed which is great news. He has been tolerating the dressing changes without complication and I do think he has done very well in regard to the healing process and offloading he has listed everything that that we instructed him to do. Obviously this appears to have paid off and he has progressed very nicely. Readmission: 07/05/17 on evaluation  today patient is seen for fault evaluation and our clinic concerning the same issue that I have previously treated him for ending back in April 2019. This is a callous region with subsequently an ulcer underneath this on the plantar aspect of his right second metatarsal region. Fortunately he's not having any significant discomfort although his girlfriend told him that she noted an area of bruising at the site underneath the callous and wanted him to come get this checked out. He was very please with our care previous and therefore was more than happy to come let us check this for him. Upon inspection initially he did have significant callous overlying the area in question. It was not easily identifiable as far as any ulcer was concerned. With that being said he with this significant callous did require some sharp debridement and following debridement there did appear to be a small ulcer underlying. Fortunately patient in general shows no signs of infection at this point. He again has neuropathy and therefore does not have any ongoing discomfort or pain at this point. 07/12/17 on evaluation today patient actually appears to be doing very well in regard to his plantar foot ulcer. In fact this appears to be completely healed and there's no residual opening at this time. Overall I'm  very pleased in this regard. Patient also is extremely pleased. Patient History Information obtained from Patient. Family History Cancer - Siblings, Diabetes - Father, Heart Disease - Mother, Hypertension - Mother, No family history of Hereditary Spherocytosis, Kidney Disease, Lung Disease, Seizures, Stroke, Thyroid Problems, Tuberculosis. Social History Former smoker, Marital Status - Single, Alcohol Use - Never, Drug Use - No History, Caffeine Use - Moderate. Review of Systems (ROS) Constitutional Symptoms (General Health) Denies complaints or symptoms of Fever, Chills. Psychiatric The patient has no complaints or  symptoms. [name], [name] ([id number]) Objective Constitutional Well-nourished and well-hydrated in no acute distress. Vitals Time Taken: 11:01 AM, Height: 73 in, Weight: 243 lbs, BMI: 32.1, Temperature: 97.9 F, Pulse: 77 bpm, Respiratory Rate: 18 breaths/min, Blood Pressure: 135/59 mmHg. Respiratory normal breathing without difficulty. Psychiatric this patient is able to make decisions and demonstrates good insight into disease process. Alert and Oriented x 3. pleasant and cooperative. General Notes: I'm happy that everything seems to be progressing nicely at this time and again he has no evidence of infection open wounds and minimal callous. Integumentary (Hair, Skin) Wound #2 status is Healed - Epithelialized. Original cause of wound was Gradually Appeared. The wound is located on the Right Metatarsal head second. The wound measures 0cm length x 0cm width x 0cm depth; 0cm^2 area and 0cm^3 volume. There is Fat Layer (Subcutaneous Tissue) Exposed exposed. There is no tunneling or undermining noted. There is a none present amount of drainage noted. The wound margin is flat and intact. There is no granulation within the wound bed. There is no necrotic tissue within the wound bed. The periwound skin appearance did not exhibit: Callus, Crepitus, Excoriation, Induration, Rash, Scarring, Dry/Scaly, Maceration, Atrophie Blanche, Cyanosis, Ecchymosis, Hemosiderin Staining, Mottled, Pallor, Rubor, Erythema. Assessment Active Problems ICD-10 Type 2 diabetes mellitus with foot ulcer Non-pressure chronic ulcer of other part of right foot with fat layer exposed Essential (primary) hypertension Chronic atrial fibrillation Plan Wound Cleansing: Clean wound with Normal Saline. Anesthetic (add to Medication List): Topical Lidocaine 4% cream applied to wound bed prior to debridement (In Clinic Only). Discharge From [facility]: Discharge from [facility] ([id number]) At this point I'm gonna recommend that we discontinue wound care services. We will see him back in the future as needed if anything changes. He did inquire as to whether or not he could come back for reevaluation for callous. If need be in the future I definitely think that is appropriate and okay. We will see him at that point when and if that is necessary. Otherwise we will continue with the Current wound care measures as far as prevention is concerned as we previously discussed. Electronic Signature(s) Signed: 07/12/2017 11:25:57 PM By: [name] Keeler PA-C Entered By: [name] on 07/12/2017 17:00:25 [name], [name] ([id number]) -------------------------------------------------------------------------------- ROS/PFSH Details Patient Name: [name] Date of Service: 07/12/2017 11:00 AM Medical Record Number: [id number] Patient Account Number: [id number] Date of Birth/Sex: [birth year] (67 y.o. M) Treating RN: [name] Primary Care Provider: Benita [name] Other Clinician: Referring Provider: Benita [name] Treating Provider/Extender: [name] III, Denys Labree [name] in Treatment: 1 Information Obtained From Patient Wound History Do you currently have one or more open woundso Yes Approximately how long have you had your woundso 2 [name] How have you been treating your wound(s) until nowo nothing Has your wound(s) ever healed and then re-openedo No Have you had any lab work done in the past montho No Have you tested  positive for osteomyelitis (bone infection)o No Have you had any tests for circulation on your legso No Constitutional Symptoms (General Health) Complaints and Symptoms: Negative for: Fever; Chills Eyes Medical History: Negative for: Cataracts; Glaucoma; Optic Neuritis Ear/Nose/Mouth/Throat Medical History: Negative for: Chronic sinus problems/congestion; Middle ear problems Hematologic/Lymphatic Medical History: Negative for: Anemia;  Hemophilia; Human Immunodeficiency Virus; Lymphedema; Sickle Cell Disease Respiratory Medical History: Negative for: Aspiration; Asthma; Chronic Obstructive Pulmonary Disease (COPD); Pneumothorax; Sleep Apnea; Tuberculosis Cardiovascular Medical History: Positive for: Arrhythmia - a fib; Hypertension Negative for: Angina; Congestive Heart Failure; Coronary Artery Disease; Deep Vein Thrombosis; Hypotension; Myocardial Infarction; Peripheral Arterial Disease; Peripheral Venous Disease; Phlebitis; Vasculitis Gastrointestinal Medical History: Negative for: Cirrhosis ; Colitis; Crohnos; Hepatitis A; Hepatitis B; Hepatitis C [name], [name] ([id number]) Endocrine Medical History: Positive for: Type II Diabetes Negative for: Type I Diabetes Treated with: Oral agents Blood sugar tested every day: No Immunological Medical History: Negative for: Lupus Erythematosus; Raynaudos; Scleroderma Integumentary (Skin) Medical History: Negative for: History of Burn; History of pressure wounds Musculoskeletal Medical History: Positive for: Osteoarthritis Negative for: Gout; Rheumatoid Arthritis; Osteomyelitis Neurologic Medical History: Negative for: Dementia; Neuropathy Oncologic Medical History: Negative for: Received Chemotherapy; Received Radiation Psychiatric Complaints and Symptoms: No Complaints or Symptoms Immunizations Pneumococcal Vaccine: Received Pneumococcal Vaccination: Yes Immunization Notes: up to date Implantable Devices Family and Social History Cancer: Yes - Siblings; Diabetes: Yes - Father; Heart Disease: Yes - Mother; Hereditary Spherocytosis: No; Hypertension: Yes - Mother; Kidney Disease: No; Lung Disease: No; Seizures: No; Stroke: No; Thyroid Problems: No; Tuberculosis: No; Former smoker; Marital Status - Single; Alcohol Use: Never; Drug Use: No History; Caffeine Use: Moderate; Financial Concerns: No; Food, Clothing or [name] Needs: No; Support System Lacking: No;  Transportation Concerns: No; Advanced Directives: No; Patient does not want information on Advanced Directives; Do not resuscitate: No; Living Will: No; Medical Power of Attorney: No Physician Affirmation I have reviewed and agree with the above information. [name], [name] ([id number]) Electronic Signature(s) Signed: 07/12/2017 11:25:57 PM By: [name] Keeler PA-C Signed: 07/13/2017 4:47:28 PM By: [name] Entered By: [name] on 07/12/2017 16:59:05 Stegemann, [name] ([id number]) -------------------------------------------------------------------------------- SuperBill Details Patient Name: CALLIN, ASHE. Date of Service: 07/12/2017 Medical Record Number: [id number] Patient Account Number: [id number] Date of Birth/Sex: 09-Sep-1950 (67 y.o. M) Treating RN: [name] Primary Care Provider: Benita [name] Other Clinician: Referring Provider: Benita [name] Treating Provider/Extender: [name], Jordann Grime [name] in Treatment: 1 Diagnosis Coding ICD-10 Codes Code Description E11.621 Type 2 diabetes mellitus with foot ulcer L97.512 Non-pressure chronic ulcer of other part of right foot with fat layer exposed I10 Essential (primary) hypertension I48.2 Chronic atrial fibrillation Facility Procedures CPT4 Code: [id number] Description: [phone] - WOUND CARE VISIT-LEV 2 EST PT Modifier: Quantity: 1 Physician Procedures CPT4 Code: 9937169 Description: [id number] - WC PHYS LEVEL 2 - EST PT ICD-10 Diagnosis Description E11.621 Type 2 diabetes mellitus with foot ulcer L97.512 Non-pressure chronic ulcer of other part of right foot with f I10 Essential (primary) hypertension I48.2 Chronic atrial  fibrillation Modifier: at layer exposed Quantity: 1 Electronic Signature(s) Signed: 07/12/2017 11:50:23 PM By: [name] Keeler PA-C Previous Signature: 07/12/2017 4:53:36 PM Version By: [name] Previous Signature: 07/12/2017 11:25:57 PM Version By: [name] Keeler PA-C Entered By: [name]  on 07/12/2017 23:41:31

## 2017-10-25 DIAGNOSIS — I5022 Chronic systolic (congestive) heart failure: Secondary | ICD-10-CM | POA: Diagnosis not present

## 2017-10-25 DIAGNOSIS — I480 Paroxysmal atrial fibrillation: Secondary | ICD-10-CM | POA: Diagnosis not present

## 2017-10-25 DIAGNOSIS — I428 Other cardiomyopathies: Secondary | ICD-10-CM | POA: Diagnosis not present

## 2017-10-27 DIAGNOSIS — E119 Type 2 diabetes mellitus without complications: Secondary | ICD-10-CM | POA: Diagnosis not present

## 2017-10-27 DIAGNOSIS — E039 Hypothyroidism, unspecified: Secondary | ICD-10-CM | POA: Diagnosis not present

## 2017-10-27 DIAGNOSIS — Z7689 Persons encountering health services in other specified circumstances: Secondary | ICD-10-CM | POA: Diagnosis not present

## 2017-10-27 DIAGNOSIS — E785 Hyperlipidemia, unspecified: Secondary | ICD-10-CM | POA: Diagnosis not present

## 2017-10-29 ENCOUNTER — Other Ambulatory Visit: Payer: Self-pay | Admitting: Internal Medicine

## 2017-10-29 DIAGNOSIS — N189 Chronic kidney disease, unspecified: Secondary | ICD-10-CM | POA: Diagnosis not present

## 2017-10-29 DIAGNOSIS — E785 Hyperlipidemia, unspecified: Secondary | ICD-10-CM | POA: Diagnosis not present

## 2017-10-29 DIAGNOSIS — E119 Type 2 diabetes mellitus without complications: Secondary | ICD-10-CM | POA: Diagnosis not present

## 2017-10-29 DIAGNOSIS — R7989 Other specified abnormal findings of blood chemistry: Secondary | ICD-10-CM

## 2017-10-29 DIAGNOSIS — E038 Other specified hypothyroidism: Secondary | ICD-10-CM | POA: Diagnosis not present

## 2017-11-02 ENCOUNTER — Ambulatory Visit
Admission: RE | Admit: 2017-11-02 | Discharge: 2017-11-02 | Disposition: A | Discharge status: Home / Self Care | Payer: PPO | Source: Ambulatory Visit | Attending: Internal Medicine | Admitting: Internal Medicine

## 2017-11-02 DIAGNOSIS — E119 Type 2 diabetes mellitus without complications: Secondary | ICD-10-CM | POA: Diagnosis not present

## 2017-11-02 DIAGNOSIS — I10 Essential (primary) hypertension: Secondary | ICD-10-CM | POA: Insufficient documentation

## 2017-11-02 DIAGNOSIS — E785 Hyperlipidemia, unspecified: Secondary | ICD-10-CM | POA: Diagnosis not present

## 2017-11-02 DIAGNOSIS — R748 Abnormal levels of other serum enzymes: Secondary | ICD-10-CM | POA: Diagnosis not present

## 2017-11-02 DIAGNOSIS — R7989 Other specified abnormal findings of blood chemistry: Secondary | ICD-10-CM | POA: Insufficient documentation

## 2017-11-08 ENCOUNTER — Ambulatory Visit (INDEPENDENT_AMBULATORY_CARE_PROVIDER_SITE_OTHER): Payer: PPO | Admitting: Podiatry

## 2017-11-08 ENCOUNTER — Encounter: Payer: Self-pay | Admitting: Podiatry

## 2017-11-08 DIAGNOSIS — E11621 Type 2 diabetes mellitus with foot ulcer: Secondary | ICD-10-CM

## 2017-11-08 DIAGNOSIS — L97521 Non-pressure chronic ulcer of other part of left foot limited to breakdown of skin: Secondary | ICD-10-CM

## 2017-11-08 DIAGNOSIS — L97519 Non-pressure chronic ulcer of other part of right foot with unspecified severity: Secondary | ICD-10-CM | POA: Diagnosis not present

## 2017-11-08 DIAGNOSIS — L97512 Non-pressure chronic ulcer of other part of right foot with fat layer exposed: Secondary | ICD-10-CM

## 2017-11-09 ENCOUNTER — Encounter: Payer: Self-pay | Admitting: Podiatry

## 2017-11-09 NOTE — Progress Notes (Signed)
This patient presents the office with chief complaint of painful calluses on the ball of both feet.  He says the callus has been thickening over time but there is no drainage noted.  He says his girlfriend states his callus has been thickening the last few months and suggested he present to this office for evaluation of both callus.  Patient states that he desires to receive diabetic insoles to help prevent future skin breakdowns.  General Appearance  Alert, conversant and in no acute stress.  Vascular  Dorsalis pedis and posterior tibial  pulses are palpable  bilaterally.  Capillary return is within normal limits  bilaterally. Temperature is within normal limits  bilaterally.  Neurologic  Senn-Weinstein monofilament wire test diminished  bilaterally. Muscle power within normal limits bilaterally.  Nails Thick disfigured discolored nails with subungual debris  from hallux to fifth toes bilaterally. No evidence of bacterial infection or drainage bilaterally.  Orthopedic  No limitations of motion  feet .  No crepitus or effusions noted.  No bony pathology or digital deformities noted. Plantarflexed second metatarsal both feet.  Skin  normotropic skin with no porokeratosis noted bilaterally.  No signs of infections or ulcers noted. Thickened disfigured discolored skin callus noted sub 2 right greater than left.  Pre-ulcerous callus  Sub 2  B/L.  Debridement of pre-ulcerous callus  B/L.  RTC 4 months.   Gardiner Barefoot DPM

## 2018-01-17 ENCOUNTER — Ambulatory Visit: Payer: PPO | Admitting: Podiatry

## 2018-01-17 ENCOUNTER — Encounter: Payer: Self-pay | Admitting: Podiatry

## 2018-01-17 DIAGNOSIS — L97512 Non-pressure chronic ulcer of other part of right foot with fat layer exposed: Secondary | ICD-10-CM

## 2018-01-17 DIAGNOSIS — L97519 Non-pressure chronic ulcer of other part of right foot with unspecified severity: Secondary | ICD-10-CM

## 2018-01-17 DIAGNOSIS — E11621 Type 2 diabetes mellitus with foot ulcer: Secondary | ICD-10-CM | POA: Diagnosis not present

## 2018-01-17 NOTE — Progress Notes (Signed)
This patient presents the office stating that his callus under his right foot has broken open.  He says he is now having drainage come from the site of the callus.  He has been treated for a diabetic ulcer sub-2 with insoles and diabetic shoes.  He says his insoles have become ineffective and when he went hunting he caused a blister to develop at the site of the callus.  He says his right forefoot has been draining.  He was scheduled for an appointment this coming Thursday but when he saw the drainage he made an appointment today.    General Appearance  Alert, conversant and in no acute stress.  Vascular  Dorsalis pedis and posterior tibial  pulses are palpable  bilaterally.  Capillary return is within normal limits  bilaterally. Temperature is within normal limits  bilaterally.  Neurologic  Senn-Weinstein monofilament wire test diminished   bilaterally. Muscle power within normal limits bilaterally.  Nails Thick disfigured discolored nails with subungual debris  from hallux to fifth toes bilaterally. No evidence of bacterial infection or drainage bilaterally.  Orthopedic  No limitations of motion  feet .  No crepitus or effusions noted.  No bony pathology or digital deformities noted.  Hammer toe second right with plantarflexed second metatarsal right foot.    Skin  normotropic skin   His callus/ulcer sub 2 right foot has formede a blister in the distal half of the callus sub 2.  No redness or infection noted.  Necrotic tissue noted distally at the site of the callus sub 2.    Ulcer/callus sub 2 right foot.    ROV  Debride necrotic tissue right foot.  Neosporin/DSD.  Home instructions given.  This patient has chronic ulcer which periodically breaks down.  He knows that he needs to provided self care and knows when he needs to be seen and treated.  He was told to soak at home and bandage.  He also needs an appointment with Liliane Channel for new diabetic shoes.  RTC 9 weeks for preventative foot care  services.   Gardiner Barefoot DPM.

## 2018-01-20 ENCOUNTER — Ambulatory Visit: Payer: PPO | Admitting: Podiatry

## 2018-01-26 DIAGNOSIS — E119 Type 2 diabetes mellitus without complications: Secondary | ICD-10-CM | POA: Diagnosis not present

## 2018-01-26 DIAGNOSIS — E033 Postinfectious hypothyroidism: Secondary | ICD-10-CM | POA: Diagnosis not present

## 2018-01-26 DIAGNOSIS — E785 Hyperlipidemia, unspecified: Secondary | ICD-10-CM | POA: Diagnosis not present

## 2018-01-28 DIAGNOSIS — E119 Type 2 diabetes mellitus without complications: Secondary | ICD-10-CM | POA: Diagnosis not present

## 2018-01-28 DIAGNOSIS — E785 Hyperlipidemia, unspecified: Secondary | ICD-10-CM | POA: Diagnosis not present

## 2018-01-28 DIAGNOSIS — E038 Other specified hypothyroidism: Secondary | ICD-10-CM | POA: Diagnosis not present

## 2018-02-08 ENCOUNTER — Encounter: Payer: PPO | Attending: Physician Assistant | Admitting: Physician Assistant

## 2018-02-08 DIAGNOSIS — E7800 Pure hypercholesterolemia, unspecified: Secondary | ICD-10-CM | POA: Insufficient documentation

## 2018-02-08 DIAGNOSIS — M2042 Other hammer toe(s) (acquired), left foot: Secondary | ICD-10-CM | POA: Diagnosis not present

## 2018-02-08 DIAGNOSIS — Z87891 Personal history of nicotine dependence: Secondary | ICD-10-CM | POA: Insufficient documentation

## 2018-02-08 DIAGNOSIS — M2041 Other hammer toe(s) (acquired), right foot: Secondary | ICD-10-CM | POA: Insufficient documentation

## 2018-02-08 DIAGNOSIS — E11621 Type 2 diabetes mellitus with foot ulcer: Secondary | ICD-10-CM | POA: Diagnosis not present

## 2018-02-08 DIAGNOSIS — L97512 Non-pressure chronic ulcer of other part of right foot with fat layer exposed: Secondary | ICD-10-CM | POA: Insufficient documentation

## 2018-02-08 DIAGNOSIS — I110 Hypertensive heart disease with heart failure: Secondary | ICD-10-CM | POA: Insufficient documentation

## 2018-02-08 DIAGNOSIS — I482 Chronic atrial fibrillation, unspecified: Secondary | ICD-10-CM | POA: Diagnosis not present

## 2018-02-09 NOTE — Progress Notes (Signed)
[name] ([id number]) Visit Report for 02/08/2018 Abuse/Suicide Risk Screen Details Patient Name: [name], CURT. Date of Service: 02/08/2018 8:45 AM Medical Record Number: [id number] Patient Account Number: [id number] Date of Birth/Sex: [birth year] (68 y.o. M) Treating RN: [name] Primary Care [name]: [name] Other Clinician: Referring [name]: [name] Treating [name]/Extender: [name] Weeks in Treatment: 0 Abuse/Suicide Risk Screen Items Answer ABUSE/SUICIDE RISK SCREEN: Has anyone close to you tried to hurt or harm you recentlyo No Do you feel uncomfortable with anyone in your familyo No Has anyone forced you do things that you didnot want to doo No Do you have any thoughts of harming yourselfo No Patient displays signs or symptoms of abuse and/or neglect. No Electronic Signature(s) Signed: 02/08/2018 5:15:57 PM By: [name], BSN, RN, CWS, Kim RN, BSN Entered By: [name], BSN, RN, CWS, Kim on 02/08/2018 08:56:17 [name], [name] Primrose ([id number]) -------------------------------------------------------------------------------- Activities of Daily Living Details Patient Name: [name], [name]. Date of Service: 02/08/2018 8:45 AM Medical Record Number: [id number] Patient Account Number: [id number] Date of Birth/Sex: 28-Nov-1950 (68 y.o. M) Treating RN: [name] Primary Care Shelvie Salsberry: [name] Other Clinician: Referring Valeta Paz: [name] Treating [name]/Extender: [name] Weeks in Treatment: 0 Activities of Daily Living Items Answer Activities of Daily Living (Please select one for each item) Drive Automobile Completely Able Take Medications Completely Able Use Telephone Completely Able Care for Appearance Completely Able Use Toilet Completely Able Bath / Shower Completely Able Dress Self Completely Able Feed Self Completely Able Walk Completely Able Get In / Out Bed Completely Able Housework Completely Able Prepare Meals Completely  Able Handle Money Completely Able Shop for Self Completely Able Electronic Signature(s) Signed: 02/08/2018 5:15:57 PM By: [name], BSN, RN, CWS, Kim RN, BSN Entered By: [name], BSN, RN, CWS, Kim on 02/08/2018 08:56:26 [name], [name] Primrose ([id number]) -------------------------------------------------------------------------------- Education Assessment Details Patient Name: [name], DELMUNDO. Date of Service: 02/08/2018 8:45 AM Medical Record Number: [id number] Patient Account Number: [id number] Date of Birth/Sex: Aug 07, 1950 (68 y.o. M) Treating RN: [name] Primary Care Idona Stach: [name] Other Clinician: Referring Prabhleen Montemayor: [name] Treating [name]/Extender: Sharalyn Ink in Treatment: 0 Primary Learner Assessed: Patient Learning Preferences/Education Level/Primary Language Learning Preference: Explanation Highest Education Level: High School Preferred Language: English Cognitive Barrier Assessment/Beliefs Language Barrier: No Translator Needed: No Memory Deficit: No Emotional Barrier: No Cultural/Religious Beliefs Affecting Medical Care: No Physical Barrier Assessment Impaired Vision: No Impaired Hearing: No Decreased Hand dexterity: No Knowledge/Comprehension Assessment Knowledge Level: High Comprehension Level: High Ability to understand written High instructions: Ability to understand verbal High instructions: Motivation Assessment Anxiety Level: Calm Cooperation: Cooperative Education Importance: Acknowledges Need Interest in Health Problems: Asks Questions Perception: Coherent Willingness to Engage in Self- High Management Activities: Readiness to Engage in Self- High Management Activities: Electronic Signature(s) Signed: 02/08/2018 5:15:57 PM By: [name], BSN, RN, CWS, Kim RN, BSN Entered By: [name], BSN, RN, CWS, Kim on 02/08/2018 08:56:56 [name], [name] Primrose  ([id number]) -------------------------------------------------------------------------------- Fall Risk Assessment Details Patient Name: [name]. Date of Service: 02/08/2018 8:45 AM Medical Record Number: [id number] Patient Account Number: [id number] Date of Birth/Sex: [birth year] (68 y.o. M) Treating RN: [name] Primary Care Ashby Moskal: [name] Other Clinician: Referring [name]: [name] Treating Yulissa [name]/Extender: [name] Weeks in Treatment: 0 Fall Risk Assessment Items Have you had 2 or more falls in the last 12 monthso 0 No Have you had any fall that resulted in injury in the last 12 monthso 0 No FALL RISK ASSESSMENT: History of falling -  immediate or within 3 months 0 No Secondary diagnosis 0 No Ambulatory aid None/bed rest/wheelchair/nurse 0 Yes Crutches/cane/walker 0 No Furniture 0 No IV Access/Saline Lock 0 No Gait/Training Normal/bed rest/immobile 0 Yes Weak 0 No Impaired 0 No Mental Status Oriented to own ability 0 Yes Electronic Signature(s) Signed: 02/08/2018 5:15:57 PM By: [name], BSN, RN, CWS, Kim RN, BSN Entered By: [name], BSN, RN, CWS, Kim on 02/08/2018 08:57:06 [name], [name] Primrose ([id number]) -------------------------------------------------------------------------------- Foot Assessment Details Patient Name: [name], [name]. Date of Service: 02/08/2018 8:45 AM Medical Record Number: [id number] Patient Account Number: [id number] Date of Birth/Sex: 05-20-1950 (68 y.o. M) Treating RN: [name] Primary Care Daric Koren: [name] Other Clinician: Referring Khalil Szczepanik: [name] Treating Saron Tweed/Extender: [name] Weeks in Treatment: 0 Foot Assessment Items Site Locations + = Sensation present, - = Sensation absent, C = Callus, U = Ulcer R = Redness, W = Warmth, M = Maceration, PU = Pre-ulcerative lesion F = Fissure, S = Swelling, D = Dryness Assessment Right: Left: Other Deformity: No No Prior Foot Ulcer: No No Prior Amputation:  No No Charcot Joint: No No Ambulatory Status: Ambulatory Without Help Gait: Steady Electronic Signature(s) Signed: 02/08/2018 5:15:57 PM By: [name], BSN, RN, CWS, Kim RN, BSN Entered By: [name], BSN, RN, CWS, Kim on 02/08/2018 08:58:00 Labonte, [name] Primrose ([id number]) -------------------------------------------------------------------------------- Nutrition Risk Assessment Details Patient Name: [name], CHANTHAVONG. Date of Service: 02/08/2018 8:45 AM Medical Record Number: [id number] Patient Account Number: [id number] Date of Birth/Sex: [birth year] (68 y.o. M) Treating RN: [name] Primary Care Karleigh Bunte: [name] Other Clinician: Referring Markayla Reichart: [name] Treating Braeden Dolinski/Extender: [name] Weeks in Treatment: 0 Height (in): 74 Weight (lbs): 240.9 Body Mass Index (BMI): 30.9 Nutrition Risk Assessment Items NUTRITION RISK SCREEN: I have an illness or condition that made me change the kind and/or amount of 0 No food I eat I eat fewer than two meals per day 0 No I eat few fruits and vegetables, or milk products 0 No I have three or more drinks of beer, liquor or wine almost every day 0 No I have tooth or mouth problems that make it hard for me to eat 0 No I don't always have enough money to buy the food I need 0 No I eat alone most of the time 0 No I take three or more different prescribed or over-the-counter drugs a day 1 Yes Without wanting to, I have lost or gained 10 pounds in the last six months 0 No I am not always physically able to shop, cook and/or feed myself 0 No Nutrition Protocols Good Risk Protocol 0 No interventions needed Moderate Risk Protocol Electronic Signature(s) Signed: 02/08/2018 5:15:57 PM By: [name], BSN, RN, CWS, Kim RN, BSN Entered By: [name], BSN, RN, CWS, Kim on 02/08/2018 08:57:15

## 2018-02-10 NOTE — Progress Notes (Signed)
[name] ([id number]) Visit Report for 02/08/2018 Allergy List Details Patient Name: [name], [name]. Date of Service: 02/08/2018 8:45 AM Medical Record Number: [id number] Patient Account Number: [id number] Date of Birth/Sex: 1950-06-07 (67 y.o. M) Treating [name]: [name] Primary Care [name]: Benita Stabile Other Clinician: Referring [name]: Benita Stabile Treating [name]/Extender: [name], [name]: 0 Allergies Active Allergies penicillin Allergy Notes Electronic Signature(s) Signed: 02/08/2018 5:15:57 PM By: [name], BSN, [name], CWS, [name], BSN Entered By: [name], BSN, [name], CWS, [name] on 02/08/2018 08:56:07 [name] ([id number]) -------------------------------------------------------------------------------- Arrival Information Details Patient Name: [name], [name]. Date of Service: 02/08/2018 8:45 AM Medical Record Number: [id number] Patient Account Number: [id number] Date of Birth/Sex: May 03, 1950 (67 y.o. M) Treating [name]: [name] Primary Care [name]: Benita Stabile Other Clinician: Referring [name]: Benita Stabile Treating [name]/Extender: [name], [name]: 0 Visit Information Patient Arrived: Ambulatory Arrival Time: 08:39 Accompanied By: self Transfer Assistance: None Patient Identification Verified: Yes Secondary Verification Process Yes Completed: Patient Has Alerts: Yes Patient Alerts: Patient on Blood Thinner 03/02/17 ABI (L) 1.14 (R) 1.20 81MG  aspirin History Since Last Visit Added or deleted any medications: No Any new allergies or adverse reactions: No Had a fall or experienced change in activities of daily living that may affect risk of falls: No Signs or symptoms of abuse/neglect since last visito No Hospitalized since last visit: No Implantable device outside of the clinic excluding cellular tissue based products placed in the center since last visit: No Pain Present Now: No Electronic Signature(s) Signed:  02/08/2018 5:15:57 PM By: [name], BSN, [name], CWS, [name], BSN Entered By: [name], BSN, [name], CWS, [name] on 02/08/2018 08:55:57 Haste, [name] ([id number]) -------------------------------------------------------------------------------- Clinic Level of Care Assessment Details Patient Name: [name], [name]. Date of Service: 02/08/2018 8:45 AM Medical Record Number: [id number] Patient Account Number: [id number] Date of Birth/Sex: 12-May-1950 (67 y.o. M) Treating [name]: [name] Primary Care Viktor Philipp: Benita Stabile Other Clinician: Referring Keylan Costabile: Benita Stabile Treating Azel Gumina/Extender: [name], [name]: 0 Clinic Level of Care Assessment Items TOOL 1 Quantity Score []  - Use when EandM and Procedure is performed on INITIAL visit 0 ASSESSMENTS - Nursing Assessment / Reassessment X - General Physical Exam (combine w/ comprehensive assessment (listed just below) when 1 20 performed on new pt. evals) X- 1 25 Comprehensive Assessment (HX, ROS, Risk Assessments, Wounds Hx, etc.) ASSESSMENTS - Wound and Skin Assessment / Reassessment []  - Dermatologic / Skin Assessment (not related to wound area) 0 ASSESSMENTS - Ostomy and/or Continence Assessment and Care []  - Incontinence Assessment and Management 0 []  - 0 Ostomy Care Assessment and Management (repouching, etc.) PROCESS - Coordination of Care X - Simple Patient / Family Education for ongoing care 1 15 []  - 0 Complex (extensive) Patient / Family Education for ongoing care X- 1 10 Staff obtains Programmer, systems, Records, Test Results / Process Orders []  - 0 Staff telephones HHA, Nursing Homes / Clarify orders / etc []  - 0 Routine Transfer to another Facility (non-emergent condition) []  - 0 Routine Hospital Admission (non-emergent condition) X- 1 15 New Admissions / Biomedical engineer / Ordering NPWT, Apligraf, etc. []  - 0 Emergency Hospital Admission (emergent condition) PROCESS - Special Needs []  - Pediatric / Minor  Patient Management 0 []  - 0 Isolation Patient Management []  - 0 Hearing / Language / Visual special needs []  - 0 Assessment of Community assistance (transportation, D/C planning, etc.) []  - 0 Additional assistance / Altered mentation []  - 0 Support Surface(s)  Assessment (bed, cushion, seat, etc.) JOSHAWA, DUBIN. ([id number]) INTERVENTIONS - Miscellaneous []  - External ear exam 0 []  - 0 Patient Transfer (multiple staff / Harrel Lemon Lift / Similar devices) []  - 0 Simple Staple / Suture removal (25 or less) []  - 0 Complex Staple / Suture removal (26 or more) []  - 0 Hypo/Hyperglycemic Management (do not check if billed separately) X- 1 15 Ankle / Brachial Index (ABI) - do not check if billed separately Has the patient been seen at the hospital within the last three years: Yes Total Score: 100 Level Of Care: New/Established - Level 3 Electronic Signature(s) Signed: 02/08/2018 5:03:48 PM By: [name] Entered By: [name] on 02/08/2018 09:28:08 Jacuinde, [name] ([id number]) -------------------------------------------------------------------------------- Encounter Discharge Information Details Patient Name: [name]. Date of Service: 02/08/2018 8:45 AM Medical Record Number: [id number] Patient Account Number: [id number] Date of Birth/Sex: 01-Jan-1951 (67 y.o. M) Treating [name]: [name] Primary Care Jadavion Spoelstra: Benita Stabile Other Clinician: Referring [name]: [name] Treating [name]/Extender: [name], [name]: 0 Encounter Discharge Information Items Post Procedure Vitals Discharge Condition: Stable Temperature (F): 97.5 Ambulatory Status: Ambulatory Pulse (bpm): 91 Discharge Destination: Home Respiratory Rate (breaths/min): 16 Transportation: Private Auto Blood Pressure (mmHg): 146/80 Accompanied By: self Schedule Follow-up Appointment: Yes Clinical Summary of Care: Electronic Signature(s) Signed: 02/08/2018 5:03:48 PM By: [name] Entered By: [name] on 02/08/2018 09:38:02 Certain, [name] ([id number]) -------------------------------------------------------------------------------- Lower Extremity Assessment Details Patient Name: [name]. Date of Service: 02/08/2018 8:45 AM Medical Record Number: [id number] Patient Account Number: [id number] Date of Birth/Sex: [birth year] (67 y.o. M) Treating [name]: [name] Primary Care Shandy Vi: Benita Stabile Other Clinician: Referring Zaid Tomes: Benita Stabile Treating Zenia Guest/Extender: [name], [name]: 0 Vascular Assessment Pulses: Dorsalis Pedis Palpable: [Right:Yes] Posterior Tibial Extremity colors, hair growth, and conditions: Extremity Color: [Right:Normal] Hair Growth on Extremity: [Right:Yes] Temperature of Extremity: [Right:Warm] Capillary Refill: [Right:< 3 seconds] Toe Nail Assessment Left: Right: Thick: Yes Discolored: Yes Deformed: Yes Improper Length and Hygiene: No Electronic Signature(s) Signed: 02/08/2018 5:15:57 PM By: [name], BSN, [name], CWS, [name], BSN Entered By: [name], BSN, [name], CWS, [name] on 02/08/2018 09:00:12 Himes, [name] ([id number]) -------------------------------------------------------------------------------- Multi Wound Chart Details Patient Name: [name]. Date of Service: 02/08/2018 8:45 AM Medical Record Number: [id number] Patient Account Number: [id number] Date of Birth/Sex: 19-Oct-1950 (67 y.o. M) Treating [name]: [name] Primary Care Kiandra Sanguinetti: Benita Stabile Other Clinician: Referring Ihsan Nomura: [name] Treating Tasheema Perrone/Extender: [name], [name]: 0 Vital Signs Height(in): 74 Pulse(bpm): 91 Weight(lbs): 240.9 Blood Pressure(mmHg): 146/80 Body Mass Index(BMI): 31 Temperature(F): 97.5 Respiratory Rate 16 (breaths/min): Photos: [3:No Photos] [N/A:N/A] Wound Location: [3:Right Foot - Plantar, Proximal] [N/A:N/A] Wounding Event: [3:Footwear Injury] [N/A:N/A] Primary  Etiology: [3:Diabetic Wound/Ulcer of the Lower Extremity] [N/A:N/A] Comorbid History: [3:Arrhythmia, Hypertension, Type II Diabetes, Osteoarthritis] [N/A:N/A] Date Acquired: [3:01/25/2018] [N/A:N/A] Weeks of [name]: [3:0] [N/A:N/A] Wound Status: [3:Open] [N/A:N/A] Measurements L x W x D [3:0.4x0.4x0.9] [N/A:N/A] (cm) Area (cm) : [3:0.126] [N/A:N/A] Volume (cm) : [3:0.113] [N/A:N/A] % Reduction in Area: [3:0.00%] [N/A:N/A] % Reduction in Volume: [3:0.00%] [N/A:N/A] Classification: [3:Unable to visualize wound bed N/A] Exudate Amount: [3:Medium] [N/A:N/A] Exudate Type: [3:Serous] [N/A:N/A] Exudate Color: [3:amber] [N/A:N/A] Wound Margin: [3:Indistinct, nonvisible] [N/A:N/A] Granulation Amount: [3:None Present (0%)] [N/A:N/A] Necrotic Amount: [3:Large (67-100%)] [N/A:N/A] Necrotic Tissue: [3:Eschar] [N/A:N/A] Epithelialization: [3:None] [N/A:N/A] Periwound Skin Texture: [3:No Abnormalities Noted] [N/A:N/A] Periwound Skin Moisture: [3:No Abnormalities Noted] [N/A:N/A] Periwound Skin Color: [3:No Abnormalities Noted] [N/A:N/A] Temperature: [3:No Abnormality] [N/A:N/A] Tenderness on Palpation: [  3:No] [N/A:N/A] Wound Preparation: [3:Ulcer Cleansing: Rinsed/Irrigated with Saline] [N/A:N/A] Topical Anesthetic Applied: Other: lidocaine 4% ILYA, NEELY ([id number]) [name] Notes Electronic Signature(s) Signed: 02/08/2018 5:03:48 PM By: [name] Entered By: [name] on 02/08/2018 09:27:51 Si, [name] ([id number]) -------------------------------------------------------------------------------- Multi-Disciplinary Care Plan Details Patient Name: [name], [name]. Date of Service: 02/08/2018 8:45 AM Medical Record Number: [id number] Patient Account Number: [id number] Date of Birth/Sex: June 21, 1950 (67 y.o. M) Treating [name]: [name] Primary Care Raahi Korber: Benita Stabile Other Clinician: Referring Yehudah Standing: Benita Stabile Treating Kynsli Haapala/Extender: [name], [name] Weeks  in [name]: 0 Active Inactive Abuse / Safety / Falls / Self Care Management Nursing Diagnoses: Potential for falls Goals: Patient will not experience any injury related to falls Date Initiated: 02/08/2018 Target Resolution Date: 04/23/2018 Goal Status: Active Interventions: Assess fall risk on admission and as needed Notes: Orientation to the Wound Care Program Nursing Diagnoses: Knowledge deficit related to the wound healing center program Goals: Patient/caregiver will verbalize understanding of the [facility] Program Date Initiated: 02/08/2018 Target Resolution Date: 04/23/2018 Goal Status: Active Interventions: Provide education on orientation to the wound center Notes: Wound/Skin Impairment Nursing Diagnoses: Impaired tissue integrity Goals: Ulcer/skin breakdown will heal within 14 weeks Date Initiated: 02/08/2018 Target Resolution Date: 04/23/2018 Goal Status: Active Interventions: Assess patient/caregiver ability to obtain necessary supplies [name], [name] ([id number]) Assess patient/caregiver ability to perform ulcer/skin care regimen upon admission and as needed Assess ulceration(s) every visit Notes: Electronic Signature(s) Signed: 02/08/2018 5:03:48 PM By: [name] Entered By: [name] on 02/08/2018 09:27:40 Harp, [name] ([id number]) -------------------------------------------------------------------------------- Pain Assessment Details Patient Name: [name]. Date of Service: 02/08/2018 8:45 AM Medical Record Number: [id number] Patient Account Number: [id number] Date of Birth/Sex: [birth year] (67 y.o. M) Treating [name]: [name] Primary Care Yecenia Dalgleish: Benita Stabile Other Clinician: Referring Adilyn Humes: Benita Stabile Treating Cordella Nyquist/Extender: [name], [name]: 0 Active Problems Location of Pain Severity and Description of Pain Patient Has Paino No Site Locations Pain Management and Medication Current Pain  Management: Electronic Signature(s) Signed: 02/08/2018 4:33:45 PM By: Paulla Fore, RRT, CHT Signed: 02/08/2018 5:03:48 PM By: [name] Entered By: Lorine Bears on 02/08/2018 08:40:05 Stauffer, [name] ([id number]) -------------------------------------------------------------------------------- Patient/Caregiver Education Details Patient Name: [name], [name]. Date of Service: 02/08/2018 8:45 AM Medical Record Number: [id number] Patient Account Number: [id number] Date of Birth/Gender: 1950/11/05 (68 y.o. M) Treating [name]: [name] Primary Care Physician: Benita Stabile Other Clinician: Referring Physician: Benita Stabile Treating Physician/Extender: Sharalyn Ink in [name]: 0 Education Assessment Education Provided To: Patient Education Topics Provided Wound/Skin Impairment: Handouts: Other: wound care as ordered Methods: Demonstration, Explain/Verbal Responses: State content correctly Electronic Signature(s) Signed: 02/08/2018 5:03:48 PM By: [name] Entered By: [name] on 02/08/2018 09:38:17 Brar, [name] ([id number]) -------------------------------------------------------------------------------- Wound Assessment Details Patient Name: [name]. Date of Service: 02/08/2018 8:45 AM Medical Record Number: [id number] Patient Account Number: [id number] Date of Birth/Sex: 1950/02/15 (67 y.o. M) Treating [name]: [name] Primary Care Dalissa Lovin: Benita Stabile Other Clinician: Referring Durwood Dittus: Benita Stabile Treating Tayra Dawe/Extender: [name], [name]: 0 Wound Status Wound Number: 3 Primary Diabetic Wound/Ulcer of the Lower Extremity Etiology: Wound Location: Right Foot - Plantar, Proximal Wound Status: Open Wounding Event: Footwear Injury Comorbid Arrhythmia, Hypertension, Type II Diabetes, Date Acquired: 01/25/2018 History: Osteoarthritis Weeks Of [name]: 0 Clustered Wound: No Photos Photo  Uploaded By: [name], BSN, [name], CWS, [name] on 02/08/2018 13:09:37 Wound Measurements Length: (cm) 0.4 Width: (cm) 0.4 Depth: (cm) 0.9 Area: (cm)  0.126 Volume: (cm) 0.113 % Reduction in Area: 0% % Reduction in Volume: 0% Epithelialization: None Tunneling: No Undermining: No Wound Description Classification: Grade 2 Foul Odor Wound Margin: Indistinct, nonvisible Slough/Fi Exudate Amount: Medium Exudate Type: Serous Exudate Color: amber After Cleansing: No brino No Wound Bed Granulation Amount: Medium (34-66%) Exposed Structure Granulation Quality: Red Fascia Exposed: No Necrotic Amount: Medium (34-66%) Fat Layer (Subcutaneous Tissue) Exposed: Yes Necrotic Quality: Adherent Slough Tendon Exposed: No Muscle Exposed: No Joint Exposed: No Bone Exposed: No Periwound Skin Texture Guise, Davidjames J. ([id number]) Texture Color No Abnormalities Noted: No No Abnormalities Noted: No Callus: Yes Atrophie Blanche: No Crepitus: No Cyanosis: No Excoriation: No Ecchymosis: No Induration: No Erythema: No Rash: No Hemosiderin Staining: No Scarring: No Mottled: No Pallor: No Moisture Rubor: No No Abnormalities Noted: No Dry / Scaly: No Temperature / Pain Maceration: No Temperature: No Abnormality Wound Preparation Ulcer Cleansing: Rinsed/Irrigated with Saline Topical Anesthetic Applied: Other: lidocaine 4%, [name] Notes Wound #3 (Right, Proximal, Plantar Foot) Notes silvercel, [place]a [place]; patient to look for offloading shoe r/t ours are on order Electronic Signature(s) Signed: 02/08/2018 10:35:46 AM By: [name] Signed: 02/08/2018 5:15:57 PM By: [name], BSN, [name], CWS, [name], BSN Entered By: [name] on 02/08/2018 10:35:46 Burkholder, [name] ([id number]) -------------------------------------------------------------------------------- Vitals Details Patient Name: [name], [name]. Date of Service: 02/08/2018 8:45 AM Medical Record Number: [id number] Patient  Account Number: [id number] Date of Birth/Sex: 1950-03-04 (67 y.o. M) Treating [name]: [name] Primary Care Kainoah Bartosiewicz: Benita Stabile Other Clinician: Referring Taejah Ohalloran: [name] Treating Voula Waln/Extender: [name], [name]: 0 Vital Signs Time Taken: 08:40 Temperature (F): 97.5 Height (in): 74 Pulse (bpm): 91 Source: Stated Respiratory Rate (breaths/min): 16 Weight (lbs): 240.9 Blood Pressure (mmHg): 146/80 Source: Measured Reference Range: 80 - 120 mg / dl Body Mass Index (BMI): 30.9 Electronic Signature(s) Signed: 02/08/2018 4:33:45 PM By: Lorine Bears RCP, RRT, CHT Entered By: Lorine Bears on 02/08/2018 08:44:01

## 2018-02-10 NOTE — Progress Notes (Signed)
SPERO, GUNNELS ([id number]) Visit Report for 02/08/2018 Chief Complaint Document Details Patient Name: [name], [name]. Date of Service: 02/08/2018 8:45 AM Medical Record Number: [id number] Patient Account Number: [id number] Date of Birth/Sex: [birth year] (68 y.o. M) Treating RN: [name] Primary Care Provider: Benita Stabile Other Clinician: Referring Provider: Benita Stabile Treating Provider/Extender: Melburn Hake, Alija Riano Weeks in Treatment: 0 Information Obtained from: Patient Chief Complaint Right plantar foot ulcer Electronic Signature(s) Signed: 02/10/2018 9:23:56 AM By: [name] Keeler PA-C Entered By: [name] on 02/08/2018 09:05:48 [name], [name] ([id number]) -------------------------------------------------------------------------------- Debridement Details Patient Name: [name]. Date of Service: 02/08/2018 8:45 AM Medical Record Number: [id number] Patient Account Number: [id number] Date of Birth/Sex: [birth year] (68 y.o. M) Treating RN: [name] Primary Care Provider: Benita Stabile Other Clinician: Referring Provider: Benita Stabile Treating Provider/Extender: STONE III, Amarise Lillo Weeks in Treatment: 0 Debridement Performed for Wound #3 Right,Proximal,Plantar Foot Assessment: Performed By: Physician STONE III, Clemens Lachman E., PA-C Debridement Type: Debridement Severity of Tissue Pre Fat layer exposed Debridement: Level of Consciousness (Pre- Awake and Alert procedure): Pre-procedure Verification/Time Yes - 09:25 Out Taken: Start Time: 09:25 Pain Control: Lidocaine 4% Topical Solution Total Area Debrided (L x W): 0.4 (cm) x 0.4 (cm) = 0.16 (cm) Tissue and other material Viable, Non-Viable, Callus, Slough, Subcutaneous, Slough debrided: Level: Skin/Subcutaneous Tissue Debridement Description: Excisional Instrument: Curette Bleeding: Minimum Hemostasis Achieved: Pressure End Time: 09:34 Procedural Pain: 0 Post Procedural Pain: 0 Response to Treatment:  Procedure was tolerated well Level of Consciousness Awake and Alert (Post-procedure): Post Debridement Measurements of Total Wound Length: (cm) 1.1 Width: (cm) 0.7 Depth: (cm) 0.2 Volume: (cm) 0.121 Character of Wound/Ulcer Post Debridement: Improved Severity of Tissue Post Debridement: Fat layer exposed Post Procedure Diagnosis Same as Pre-procedure Electronic Signature(s) Signed: 02/08/2018 5:03:48 PM By: [name] Signed: 02/10/2018 9:23:56 AM By: [name] Keeler PA-C Entered By: [name] on 02/08/2018 09:34:55 [name], [name] ([id number]) -------------------------------------------------------------------------------- HPI Details Patient Name: [name]. Date of Service: 02/08/2018 8:45 AM Medical Record Number: [id number] Patient Account Number: [id number] Date of Birth/Sex: 27-Jun-1950 (68 y.o. M) Treating RN: [name] Primary Care Provider: Benita Stabile Other Clinician: Referring Provider: Benita Stabile Treating Provider/Extender: STONE III, Aylyn Wenzler Weeks in Treatment: 0 History of Present Illness Associated Signs and Symptoms: Patient has a history of diabetes mellitus type II, hypertension, and atrial fibrillation. He also has a hammertoe deformity of the bilateral feet which seems to be causing pressure in the ball of his foot. HPI Description: 03/09/17 on evaluation today patient presents for his initial evaluation concerning an ulcer on the plantar aspect of his right foot which has been open he tells me for about three weeks. He has been seen at [facility] where they did perform debridement it appears according to a note on 02/22/17 where unfortunately it appears that his callous area began to break down into an ulcer after modifications have been added to his insulin. This had happened previously as well. However I do not have details of the severity of debridement at that point although it does not sound as if you the debridement was too  significant based on what the patient is telling me that he still had a lot of callous following debridement. With that being said they were going to look into altering his insoles to try to prevent further breakdown in the future. Nonetheless he has had foul odor discharge coming from the ulcer which is noted all the way back to that visit on 02/22/17  [facility]. Patient states that this was concerning him more than anything else. He does have diabetes although he described this as "borderline diabetes" he is on metoprolol however along with lisinopril. Patient is not having any issues with pain in regard to his right plantar foot. 03/16/17 on evaluation today patient appears to be doing much better in regard to his plantar foot ulcer. He actually tells me that he loves the peg assist offloading shoe and that he hasn't had any pain in the ulcer area from the callous since I worked on it last week. Overall he is extremely happy with how things have progressed. Likewise the wound bed has no slough noted there is no evidence of infection and it looks excellent. I did receive the results of his hemoglobin A1c which showed a value of 5.9 which was elevated but actually rather well. Subsequently I did also receive the x-ray of his foot which showed diffuse degenerative change but no underlying acute bony abnormality. 03/23/17 on evaluation today patient appears to be doing better in regard to his right plantar foot ulcer. He continues to show signs of improvement there's definitely not as much drainage at this point. He has been tolerating the dressing changes without complication he is not happy with the peg assist offloading shoe but at the same time I do believe that it is making good progress as far as offloading is concerned. I still believe he may need to talk to a surgeon about surgically correcting a hammertoe in order to avoid additional pressure to the site especially since he Artie has  diabetic shoes and they do not seem to have prevented callous buildup in ulcer formation. 03/30/17 on evaluation today patient appears to be doing better in regard to his right plantar foot ulcer. Continues to show signs of improvement which is good news. Unfortunately we were unable to get the appointment with the orthopedic, Dr. Doran Durand, whom I recommended for him due to patient having a balance at the practice. With that being said patient's foot ulcer does seem to be doing much better on evaluation today he still has some depth to the wound but overall we are seeing improvement and epithelialization week by week. Hopefully this is something that will close shortly. 04/05/17 on evaluation today patient unfortunately has what appears to be an area of the plantar surface of his ulcer where he had fluid collection where the callous grew over top of the wound bed. Unfortunately there was some pus like material noted during cleansing that we did sent for culture today. Hopefully this is not truly an infection is or does not appear to be any evidence of erythema surrounding and maybe this is just simply a small setback with a fluid collection that has caused this issue. Nonetheless we will see what that shows when we get the culture back. I am also going to have it completely antibiotic which I previously placed him on anyway which will hopefully prevent any true infection from setting in. 04/12/17 on evaluation today patient's ulcer on the plantar aspect of his right foot actually appears to be better then during last weeks evaluation. In general he has been tolerating the dressing changes in utilizing the offloading shoe which does seem to be beneficial for him. With that being said he still seems to get some pressure to the area just not nearly as much as he previously had noted. Again fortunately there is no significant pain. [name], [name] ([id number]) 04/19/17 on  evaluation today patient appears to  be doing excellent in regard to his right foot ulcer. He has been tolerating the dressing changes without complication. There really has not been any drainage over the past few days he tells me. With that being said he seems to be doing excellent in my opinion at this point he does have some callous buildup. 04/26/17 On evaluation today patient's wound appears to be completely healed which is great news. He has been tolerating the dressing changes without complication and I do think he has done very well in regard to the healing process and offloading he has listed everything that that we instructed him to do. Obviously this appears to have paid off and he has progressed very nicely. Readmission: 07/05/17 on evaluation today patient is seen for fault evaluation and our clinic concerning the same issue that I have previously treated him for ending back in April 2019. This is a callous region with subsequently an ulcer underneath this on the plantar aspect of his right second metatarsal region. Fortunately he's not having any significant discomfort although his girlfriend told him that she noted an area of bruising at the site underneath the callous and wanted him to come get this checked out. He was very please with our care previous and therefore was more than happy to come let us check this for him. Upon inspection initially he did have significant callous overlying the area in question. It was not easily identifiable as far as any ulcer was concerned. With that being said he with this significant callous did require some sharp debridement and following debridement there did appear to be a small ulcer underlying. Fortunately patient in general shows no signs of infection at this point. He again has neuropathy and therefore does not have any ongoing discomfort or pain at this point. 07/12/17 on evaluation today patient actually appears to be doing very well in regard to his plantar foot ulcer. In fact  this appears to be completely healed and there's no residual opening at this time. Overall I'm very pleased in this regard. Patient also is extremely pleased. Readmission: 02/08/18 patient presents today for follow-up evaluation he has previously been seen here in our office for the same issue. Fortunately there is not been any evidence of infection since I last saw him he does note that he is been going to see his local podiatrist for callous pairing. During that time they actually noted that he had an open wound and subsequently referred him back ties for evaluation of this wound. He has done very well in the past with good wound care often healing in just a few weeks. No fevers, chills, nausea, or vomiting noted at this time. Electronic Signature(s) Signed: 02/10/2018 9:23:56 AM By: [name] Keeler PA-C Entered By: [name] on 02/08/2018 09:53:07 [name], [name] ([id number]) -------------------------------------------------------------------------------- Physical Exam Details Patient Name: XHAIDEN, COOMBS. Date of Service: 02/08/2018 8:45 AM Medical Record Number: [id number] Patient Account Number: [id number] Date of Birth/Sex: Jul 26, 1950 (68 y.o. M) Treating RN: [name] Primary Care Provider: Benita Stabile Other Clinician: Referring Provider: TATE, Sharlet Salina Treating Provider/Extender: STONE III, Zuleima Haser Weeks in Treatment: 0 Constitutional sitting or standing blood pressure is within target range for patient.. pulse regular and within target range for patient.[name] respirations regular, non-labored and within target range for patient.[name] temperature within target range for patient.. Well- nourished and well-hydrated in no acute distress. Eyes conjunctiva clear no eyelid edema noted. pupils equal round and reactive to light and  accommodation. Ears, Nose, Mouth, and Throat no gross abnormality of ear auricles or external auditory canals. normal hearing noted during conversation.  mucus membranes moist. Respiratory normal breathing without difficulty. clear to auscultation bilaterally. Cardiovascular regular rate and rhythm with normal S1, S2. 2+ dorsalis pedis/posterior tibialis pulses. no clubbing, cyanosis, significant edema, <3 sec cap refill. Gastrointestinal (GI) soft, non-tender, non-distended, +BS. no ventral hernia noted. Musculoskeletal normal gait and posture. no significant deformity or arthritic changes, no loss or range of motion, no clubbing. Psychiatric this patient is able to make decisions and demonstrates good insight into disease process. Alert and Oriented x 3. pleasant and cooperative. Notes Patient's wound bed currently actually revealed a significant amount of callous buildup which did require sharp debridement today. I was able to remove the entire region of overgrown callous and pair this down to good tissue. Subsequently he had no pain with any of this which is excellent news. I then cleared out the central portion of the wound where he actually had the up and ulcer and this fortunately did not appear to be too significant as far as the ulcer was concerned it was fairly superficial underline this callous. Nonetheless I think it is a good chance of healing well. Electronic Signature(s) Signed: 02/10/2018 9:23:56 AM By: [name] Keeler PA-C Entered By: [name] on 02/08/2018 09:57:10 [name], [name] ([id number]) -------------------------------------------------------------------------------- Physician Orders Details Patient Name: [name], [name]. Date of Service: 02/08/2018 8:45 AM Medical Record Number: [id number] Patient Account Number: [id number] Date of Birth/Sex: 1950-09-25 (68 y.o. M) Treating RN: [name] Primary Care Provider: Benita Stabile Other Clinician: Referring Provider: Benita Stabile Treating Provider/Extender: Melburn Hake, Dulse Rutan Weeks in Treatment: 0 Verbal / Phone Orders: No Diagnosis Coding ICD-10 Coding Code  Description E11.621 Type 2 diabetes mellitus with foot ulcer L97.512 Non-pressure chronic ulcer of other part of right foot with fat layer exposed I10 Essential (primary) hypertension I48.20 Chronic atrial fibrillation, unspecified Wound Cleansing Wound #3 Right,Proximal,Plantar Foot o Clean wound with Normal Saline. Anesthetic (add to Medication List) Wound #3 Right,Proximal,Plantar Foot o Topical Lidocaine 4% cream applied to wound bed prior to debridement (In Clinic Only). Primary Wound Dressing Wound #3 Right,Proximal,Plantar Foot o Silver Alginate Secondary Dressing Wound #3 [place] Dressing Change Frequency Wound #3 Right,Proximal,Plantar Foot o Change dressing every other day. Follow-up Appointments Wound #3 Right,Proximal,Plantar Foot o Return Appointment in 1 week. Off-Loading Wound #3 Right,Proximal,Plantar Foot o Open toe surgical shoe to: - right foot Electronic Signature(s) Signed: 02/08/2018 5:03:48 PM By: [name] Signed: 02/10/2018 9:23:56 AM By: [name] Keeler PA-C Entered By: [name] on 02/08/2018 09:36:40 [name], [name] ([id number]) [name], [name] ([id number]) -------------------------------------------------------------------------------- Problem List Details Patient Name: [name], CHANDLEY. Date of Service: 02/08/2018 8:45 AM Medical Record Number: [id number] Patient Account Number: [id number] Date of Birth/Sex: Jan 06, 1951 (68 y.o. M) Treating RN: [name] Primary Care Provider: Benita Stabile Other Clinician: Referring Provider: Benita Stabile Treating Provider/Extender: Melburn Hake, Elese Rane Weeks in Treatment: 0 Active Problems ICD-10 Evaluated Encounter Code Description Active Date Today Diagnosis E11.621 Type 2 diabetes mellitus with foot ulcer 02/08/2018 No Yes L97.512 Non-pressure chronic ulcer of other part of right foot with fat 02/08/2018 No Yes layer exposed [place] (primary)  hypertension 02/08/2018 No Yes I48.20 Chronic atrial fibrillation, unspecified 02/08/2018 No Yes Inactive Problems Resolved Problems Electronic Signature(s) Signed: 02/10/2018 9:23:56 AM By: [name] Keeler PA-C Entered By: [name] on 02/08/2018 09:05:37 Rundquist, [name] ([id number]) -------------------------------------------------------------------------------- Progress Note Details Patient  Name: Skerritt, Vernell J. Date of Service: 02/08/2018 8:45 AM Medical Record Number: [id number] Patient Account Number: [id number] Date of Birth/Sex: 23-Apr-1950 (68 y.o. M) Treating RN: [name] Primary Care Provider: Benita Stabile Other Clinician: Referring Provider: Benita Stabile Treating Provider/Extender: Melburn Hake, Jameia Makris Weeks in Treatment: 0 Subjective Chief Complaint Information obtained from Patient Right plantar foot ulcer History of Present Illness (HPI) The following HPI elements were documented for the patient's wound: Associated Signs and Symptoms: Patient has a history of diabetes mellitus type II, hypertension, and atrial fibrillation. He also has a hammertoe deformity of the bilateral feet which seems to be causing pressure in the ball of his foot. 03/09/17 on evaluation today patient presents for his initial evaluation concerning an ulcer on the plantar aspect of his right foot which has been open he tells me for about three weeks. He has been seen at [facility] where they did perform debridement it appears according to a note on 02/22/17 where unfortunately it appears that his callous area began to break down into an ulcer after modifications have been added to his insulin. This had happened previously as well. However I do not have details of the severity of debridement at that point although it does not sound as if you the debridement was too significant based on what the patient is telling me that he still had a lot of callous following debridement. With that being  said they were going to look into altering his insoles to try to prevent further breakdown in the future. Nonetheless he has had foul odor discharge coming from the ulcer which is noted all the way back to that visit on [facility]. Patient states that this was concerning him more than anything else. He does have diabetes although he described this as "borderline diabetes" he is on metoprolol however along with lisinopril. Patient is not having any issues with pain in regard to his right plantar foot. 03/16/17 on evaluation today patient appears to be doing much better in regard to his plantar foot ulcer. He actually tells me that he loves the peg assist offloading shoe and that he hasn't had any pain in the ulcer area from the callous since I worked on it last week. Overall he is extremely happy with how things have progressed. Likewise the wound bed has no slough noted there is no evidence of infection and it looks excellent. I did receive the results of his hemoglobin A1c which showed a value of 5.9 which was elevated but actually rather well. Subsequently I did also receive the x-ray of his foot which showed diffuse degenerative change but no underlying acute bony abnormality. 03/23/17 on evaluation today patient appears to be doing better in regard to his right plantar foot ulcer. He continues to show signs of improvement there's definitely not as much drainage at this point. He has been tolerating the dressing changes without complication he is not happy with the peg assist offloading shoe but at the same time I do believe that it is making good progress as far as offloading is concerned. I still believe he may need to talk to a surgeon about surgically correcting a hammertoe in order to avoid additional pressure to the site especially since he Artie has diabetic shoes and they do not seem to have prevented callous buildup in ulcer formation. 03/30/17 on evaluation today patient  appears to be doing better in regard to his right plantar foot ulcer. Continues to show signs of  improvement which is good news. Unfortunately we were unable to get the appointment with the orthopedic, Dr. Doran Durand, whom I recommended for him due to patient having a balance at the practice. With that being said patient's foot ulcer does seem to be doing much better on evaluation today he still has some depth to the wound but overall we are seeing improvement and epithelialization week by week. Hopefully this is something that will close shortly. 04/05/17 on evaluation today patient unfortunately has what appears to be an area of the plantar surface of his ulcer where he had fluid collection where the callous grew over top of the wound bed. Unfortunately there was some pus like material noted during cleansing that we did sent for culture today. Hopefully this is not truly an infection is or does not appear to be any JACOLBY, RISBY. ([id number]) evidence of erythema surrounding and maybe this is just simply a small setback with a fluid collection that has caused this issue. Nonetheless we will see what that shows when we get the culture back. I am also going to have it completely antibiotic which I previously placed him on anyway which will hopefully prevent any true infection from setting in. 04/12/17 on evaluation today patient's ulcer on the plantar aspect of his right foot actually appears to be better then during last weeks evaluation. In general he has been tolerating the dressing changes in utilizing the offloading shoe which does seem to be beneficial for him. With that being said he still seems to get some pressure to the area just not nearly as much as he previously had noted. Again fortunately there is no significant pain. 04/19/17 on evaluation today patient appears to be doing excellent in regard to his right foot ulcer. He has been tolerating the dressing changes without complication. There  really has not been any drainage over the past few days he tells me. With that being said he seems to be doing excellent in my opinion at this point he does have some callous buildup. 04/26/17 On evaluation today patient's wound appears to be completely healed which is great news. He has been tolerating the dressing changes without complication and I do think he has done very well in regard to the healing process and offloading he has listed everything that that we instructed him to do. Obviously this appears to have paid off and he has progressed very nicely. Readmission: 07/05/17 on evaluation today patient is seen for fault evaluation and our clinic concerning the same issue that I have previously treated him for ending back in April 2019. This is a callous region with subsequently an ulcer underneath this on the plantar aspect of his right second metatarsal region. Fortunately he's not having any significant discomfort although his girlfriend told him that she noted an area of bruising at the site underneath the callous and wanted him to come get this checked out. He was very please with our care previous and therefore was more than happy to come let us check this for him. Upon inspection initially he did have significant callous overlying the area in question. It was not easily identifiable as far as any ulcer was concerned. With that being said he with this significant callous did require some sharp debridement and following debridement there did appear to be a small ulcer underlying. Fortunately patient in general shows no signs of infection at this point. He again has neuropathy and therefore does not have any ongoing discomfort or pain at this  point. 07/12/17 on evaluation today patient actually appears to be doing very well in regard to his plantar foot ulcer. In fact this appears to be completely healed and there's no residual opening at this time. Overall I'm very pleased in this regard.  Patient also is extremely pleased. Readmission: 02/08/18 patient presents today for follow-up evaluation he has previously been seen here in our office for the same issue. Fortunately there is not been any evidence of infection since I last saw him he does note that he is been going to see his local podiatrist for callous pairing. During that time they actually noted that he had an open wound and subsequently referred him back ties for evaluation of this wound. He has done very well in the past with good wound care often healing in just a few weeks. No fevers, chills, nausea, or vomiting noted at this time. Wound History Patient presents with 1 open wound that has been present for approximately 2 weeks. Patient has been treating wound in the following manner: silvercell. The wound has been healed in the past but has re-opened. Laboratory tests have been performed in the last month. Patient reportedly has not tested positive for an antibiotic resistant organism. Patient reportedly has not tested positive for osteomyelitis. Patient reportedly has had testing performed to evaluate circulation in the legs. Patient History Information obtained from Patient. Allergies penicillin Family History Cancer - Siblings, Diabetes - Father, Heart Disease - Mother, Hypertension - Mother, No family history of Hereditary Spherocytosis, Kidney Disease, Lung Disease, Seizures, Stroke, Thyroid Problems, Tuberculosis. YOSGAR, DEMIRJIAN ([id number]) Social History Former smoker, Marital Status - Single, Alcohol Use - Never, Drug Use - No History, Caffeine Use - Moderate. Medical History Genitourinary Denies history of End Stage Renal Disease Neurologic Denies history of Quadriplegia, Paraplegia, Seizure Disorder Medical And Surgical History Notes Cardiovascular High Cholesterol Review of Systems (ROS) Eyes The patient has no complaints or symptoms. Ear/Nose/Mouth/Throat The patient has no complaints or  symptoms. Hematologic/Lymphatic The patient has no complaints or symptoms. Respiratory The patient has no complaints or symptoms. Cardiovascular The patient has no complaints or symptoms. Gastrointestinal The patient has no complaints or symptoms. Endocrine The patient has no complaints or symptoms. Genitourinary The patient has no complaints or symptoms. Immunological The patient has no complaints or symptoms. Integumentary (Skin) Complains or has symptoms of Wounds. Denies complaints or symptoms of Bleeding or bruising tendency, Breakdown, Swelling. Musculoskeletal The patient has no complaints or symptoms. Neurologic The patient has no complaints or symptoms. Oncologic The patient has no complaints or symptoms. Objective Constitutional sitting or standing blood pressure is within target range for patient.. pulse regular and within target range for patient.[name] respirations regular, non-labored and within target range for patient.[name] temperature within target range for patient.. Well- nourished and well-hydrated in no acute distress. Vitals Time Taken: 8:40 AM, Height: 74 in, Source: Stated, Weight: 240.9 lbs, Source: Measured, BMI: 30.9, Temperature: Borras, Tesean J. ([id number]) 97.5 F, Pulse: 91 bpm, Respiratory Rate: 16 breaths/min, Blood Pressure: 146/80 mmHg. Eyes conjunctiva clear no eyelid edema noted. pupils equal round and reactive to light and accommodation. Ears, Nose, Mouth, and Throat no gross abnormality of ear auricles or external auditory canals. normal hearing noted during conversation. mucus membranes moist. Respiratory normal breathing without difficulty. clear to auscultation bilaterally. Cardiovascular regular rate and rhythm with normal S1, S2. 2+ dorsalis pedis/posterior tibialis pulses. no clubbing, cyanosis, significant edema, Gastrointestinal (GI) soft, non-tender, non-distended, +BS. no ventral hernia noted. Musculoskeletal normal gait and  posture.  no significant deformity or arthritic changes, no loss or range of motion, no clubbing. Psychiatric this patient is able to make decisions and demonstrates good insight into disease process. Alert and Oriented x 3. pleasant and cooperative. General Notes: Patient's wound bed currently actually revealed a significant amount of callous buildup which did require sharp debridement today. I was able to remove the entire region of overgrown callous and pair this down to good tissue. Subsequently he had no pain with any of this which is excellent news. I then cleared out the central portion of the wound where he actually had the up and ulcer and this fortunately did not appear to be too significant as far as the ulcer was concerned it was fairly superficial underline this callous. Nonetheless I think it is a good chance of healing well. Integumentary (Hair, Skin) Wound #3 status is Open. Original cause of wound was Footwear Injury. The wound is located on the Right,Proximal,Plantar Foot. The wound measures 0.4cm length x 0.4cm width x 0.9cm depth; 0.126cm^2 area and 0.113cm^3 volume. There is no tunneling or undermining noted. There is a medium amount of serous drainage noted. The wound margin is indistinct and nonvisible. There is no granulation within the wound bed. There is a large (67-100%) amount of necrotic tissue within the wound bed including Eschar. Periwound temperature was noted as No Abnormality. Assessment Active Problems ICD-10 Type 2 diabetes mellitus with foot ulcer Non-pressure chronic ulcer of other part of right foot with fat layer exposed Essential (primary) hypertension Chronic atrial fibrillation, unspecified Parrillo, Jameire J. ([id number]) Procedures Wound #3 Pre-procedure diagnosis of Wound #3 is a Diabetic Wound/Ulcer of the Lower Extremity located on the Right,Proximal,Plantar Foot .Severity of Tissue Pre Debridement is: Fat layer exposed. There was a Excisional  Skin/Subcutaneous Tissue Debridement with a total area of 0.16 sq cm performed by STONE III, Trishna Cwik E., PA-C. With the following instrument(s): Curette to remove Viable and Non-Viable tissue/material. Material removed includes Callus, Subcutaneous Tissue, and Slough after achieving pain control using Lidocaine 4% Topical Solution. No specimens were taken. A time out was conducted at 09:25, prior to the start of the procedure. A Minimum amount of bleeding was controlled with Pressure. The procedure was tolerated well with a pain level of 0 throughout and a pain level of 0 following the procedure. Post Debridement Measurements: 1.1cm length x 0.7cm width x 0.2cm depth; 0.121cm^3 volume. Character of Wound/Ulcer Post Debridement is improved. Severity of Tissue Post Debridement is: Fat layer exposed. Post procedure Diagnosis Wound #3: Same as Pre-Procedure Plan Wound Cleansing: Wound #3 Right,Proximal,Plantar Foot: Clean wound with Normal Saline. Anesthetic (add to Medication List): Wound #3 Right,Proximal,Plantar Foot: Topical Lidocaine 4% cream applied to wound bed prior to debridement (In Clinic Only). Primary Wound Dressing: Wound #3 Right,Proximal,Plantar Foot: Silver Alginate Secondary Dressing: Wound #3 Right,Proximal,Plantar Foot: [place] Dressing Change Frequency: Wound #3 Right,Proximal,Plantar Foot: Change dressing every other day. Follow-up Appointments: Wound #3 Right,Proximal,Plantar Foot: Return Appointment in 1 week. Off-Loading: Wound #3 Right,Proximal,Plantar Foot: Open toe surgical shoe to: - right foot At this point post debridement everything appears to be doing very well which is excellent news. I'm in a recommend that we go ahead and initiate the silver alginate dressing which always does very well for him and we will subsequently see were things stand at follow-up. If anything changes or worsens he will contact the office and let me know. Please see above for  specific wound care orders. We will see patient for re-evaluation in 1  week(s) here in the clinic. If anything worsens or changes patient will contact our office for additional recommendations. Electronic Signature(s) Signed: 02/10/2018 9:23:56 AM By: Janeann Forehand, Moyers ([id number]) Entered By: [name] on 02/08/2018 09:59:34 Ton, [name] ([id number]) -------------------------------------------------------------------------------- ROS/PFSH Details Patient Name: YOSEPH, HAILE. Date of Service: 02/08/2018 8:45 AM Medical Record Number: [id number] Patient Account Number: [id number] Date of Birth/Sex: 07/08/1950 (68 y.o. M) Treating RN: Cornell Barman Primary Care Provider: Benita Stabile Other Clinician: Referring Provider: Benita Stabile Treating Provider/Extender: Melburn Hake, Ashir Kunz Weeks in Treatment: 0 Information Obtained From Patient Wound History Do you currently have one or more open woundso Yes How many open wounds do you currently haveo 1 Approximately how long have you had your woundso 2 weeks How have you been treating your wound(s) until nowo silvercell Has your wound(s) ever healed and then re-openedo Yes Have you had any lab work done in the past montho Yes Who ordered the lab work doneo Benita Stabile PCP Have you tested positive for an antibiotic resistant organism (MRSA, VRE)o No Have you tested positive for osteomyelitis (bone infection)o No Have you had any tests for circulation on your legso Yes Integumentary (Skin) Complaints and Symptoms: Positive for: Wounds Negative for: Bleeding or bruising tendency; Breakdown; Swelling Medical History: Negative for: History of Burn; History of pressure wounds Eyes Complaints and Symptoms: No Complaints or Symptoms Medical History: Negative for: Cataracts; Glaucoma; Optic Neuritis Ear/Nose/Mouth/Throat Complaints and Symptoms: No Complaints or Symptoms Medical History: Negative for: Chronic sinus  problems/congestion; Middle ear problems Hematologic/Lymphatic Complaints and Symptoms: No Complaints or Symptoms Medical History: Negative for: Anemia; Hemophilia; Human Immunodeficiency Virus; Lymphedema; Sickle Cell Disease Respiratory Pettengill, Shadman J. ([id number]) Complaints and Symptoms: No Complaints or Symptoms Medical History: Negative for: Aspiration; Asthma; Chronic Obstructive Pulmonary Disease (COPD); Pneumothorax; Sleep Apnea; Tuberculosis Cardiovascular Complaints and Symptoms: No Complaints or Symptoms Medical History: Positive for: Arrhythmia - a fib; Hypertension Negative for: Angina; Congestive Heart Failure; Coronary Artery Disease; Deep Vein Thrombosis; Hypotension; Myocardial Infarction; Peripheral Arterial Disease; Peripheral Venous Disease; Phlebitis; Vasculitis Past Medical History Notes: High Cholesterol Gastrointestinal Complaints and Symptoms: No Complaints or Symptoms Medical History: Negative for: Cirrhosis ; Colitis; Crohnos; Hepatitis A; Hepatitis B; Hepatitis C Endocrine Complaints and Symptoms: No Complaints or Symptoms Medical History: Positive for: Type II Diabetes Negative for: Type I Diabetes Time with diabetes: 3 years Treated with: Oral agents Blood sugar tested every day: No Genitourinary Complaints and Symptoms: No Complaints or Symptoms Medical History: Negative for: End Stage Renal Disease Immunological Complaints and Symptoms: No Complaints or Symptoms Medical History: Negative for: Lupus Erythematosus; Raynaudos; Scleroderma Musculoskeletal Complaints and Symptoms: No Complaints or Symptoms Bontempo, Bradden J. ([id number]) Medical History: Positive for: Osteoarthritis Negative for: Gout; Rheumatoid Arthritis; Osteomyelitis Neurologic Complaints and Symptoms: No Complaints or Symptoms Medical History: Negative for: Dementia; Neuropathy; Quadriplegia; Paraplegia; Seizure Disorder Oncologic Complaints and Symptoms: No  Complaints or Symptoms Medical History: Negative for: Received Chemotherapy; Received Radiation Immunizations Pneumococcal Vaccine: Received Pneumococcal Vaccination: Yes Immunization Notes: up to date Implantable Devices Family and Social History Cancer: Yes - Siblings; Diabetes: Yes - Father; Heart Disease: Yes - Mother; Hereditary Spherocytosis: No; Hypertension: Yes - Mother; Kidney Disease: No; Lung Disease: No; Seizures: No; Stroke: No; Thyroid Problems: No; Tuberculosis: No; Former smoker; Marital Status - Single; Alcohol Use: Never; Drug Use: No History; Caffeine Use: Moderate; Financial Concerns: No; Food, Clothing or Shelter Needs: No; Support System Lacking: No; Transportation Concerns: No; Advanced Directives: No; Patient does not want  information on Advanced Directives; Do not resuscitate: No; Living Will: No; Medical Power of Attorney: No Electronic Signature(s) Signed: 02/08/2018 5:15:57 PM By: Gretta Cool, BSN, RN, CWS, Kim RN, BSN Signed: 02/10/2018 9:23:56 AM By: [name] Keeler PA-C Entered By: Gretta Cool, BSN, RN, CWS, Kim on 02/08/2018 08:55:39 Maravilla, [name] ([id number]) -------------------------------------------------------------------------------- SuperBill Details Patient Name: ELAD, MACPHAIL. Date of Service: 02/08/2018 Medical Record Number: [id number] Patient Account Number: [id number] Date of Birth/Sex: 12/30/1950 (68 y.o. M) Treating RN: [name] Primary Care Provider: Benita Stabile Other Clinician: Referring Provider: Benita Stabile Treating Provider/Extender: Melburn Hake, Wendell Fiebig Weeks in Treatment: 0 Diagnosis Coding ICD-10 Codes Code Description E11.621 Type 2 diabetes mellitus with foot ulcer L97.512 Non-pressure chronic ulcer of other part of right foot with fat layer exposed I10 Essential (primary) hypertension I48.20 Chronic atrial fibrillation, unspecified Facility Procedures CPT4 Code: 89373428 Description: [place] VISIT-LEV 3 EST  PT Modifier: Quantity: 1 CPT4 Code: 76811572 Description: 11042 - DEB SUBQ TISSUE 20 SQ CM/< ICD-10 Diagnosis Description L97.512 Non-pressure chronic ulcer of other part of right foot with fa Modifier: t layer exposed Quantity: 1 Physician Procedures CPT4 Code: 6203559 Description: 99214 - WC PHYS LEVEL 4 - EST PT ICD-10 Diagnosis Description E11.621 Type 2 diabetes mellitus with foot ulcer L97.512 Non-pressure chronic ulcer of other part of right foot with fa I10 Essential (primary) hypertension I48.20 Chronic atrial  fibrillation, unspecified Modifier: 25 t layer exposed Quantity: 1 CPT4 Code: [id number] Description: 11042 - WC PHYS SUBQ TISS 20 SQ CM ICD-10 Diagnosis Description L97.512 Non-pressure chronic ulcer of other part of right foot with fa Modifier: t layer exposed Quantity: 1 Electronic Signature(s) Signed: 02/10/2018 9:23:56 AM By: [name] Keeler PA-C Entered By: [name] on 02/08/2018 09:59:50

## 2018-02-15 ENCOUNTER — Encounter: Payer: PPO | Admitting: Physician Assistant

## 2018-02-15 DIAGNOSIS — E11621 Type 2 diabetes mellitus with foot ulcer: Secondary | ICD-10-CM | POA: Diagnosis not present

## 2018-02-15 DIAGNOSIS — L97512 Non-pressure chronic ulcer of other part of right foot with fat layer exposed: Secondary | ICD-10-CM | POA: Diagnosis not present

## 2018-02-17 NOTE — Progress Notes (Signed)
[name] ([id number]) Visit Report for 02/15/2018 Chief Complaint Document Details Patient Name: [name], [name]. Date of Service: 02/15/2018 9:00 AM Medical Record Number: [id number] Patient Account Number: [id number] Date of Birth/Sex: Jun 07, 1950 (67 y.o. M) Treating RN: Montey Hora Primary Care Provider: Benita Stabile Other Clinician: Referring Provider: Benita Stabile Treating Provider/Extender: Melburn Hake, HOYT Weeks in Treatment: 1 Information Obtained from: Patient Chief Complaint Right plantar foot ulcer Electronic Signature(s) Signed: 02/17/2018 1:01:02 AM By: Worthy Keeler PA-C Entered By: Worthy Keeler on 02/15/2018 09:27:22 Weyman, [name] ([id number]) -------------------------------------------------------------------------------- Debridement Details Patient Name: [name]. Date of Service: 02/15/2018 9:00 AM Medical Record Number: [id number] Patient Account Number: [id number] Date of Birth/Sex: 18-Jan-1951 (67 y.o. M) Treating RN: Montey Hora Primary Care Provider: Benita Stabile Other Clinician: Referring Provider: Benita Stabile Treating Provider/Extender: STONE III, HOYT Weeks in Treatment: 1 Debridement Performed for Wound #3 Right,Proximal,Plantar Foot Assessment: Performed By: Physician STONE III, HOYT E., PA-C Debridement Type: Debridement Severity of Tissue Pre Fat layer exposed Debridement: Level of Consciousness (Pre- Awake and Alert procedure): Pre-procedure Verification/Time Yes - 09:29 Out Taken: Start Time: 09:29 Pain Control: Lidocaine 4% Topical Solution Total Area Debrided (L x W): 0.1 (cm) x 0.1 (cm) = 0.01 (cm) Tissue and other material Viable, Non-Viable, Callus, Eschar, Subcutaneous debrided: Level: Skin/Subcutaneous Tissue Debridement Description: Excisional Instrument: Curette Bleeding: Minimum Hemostasis Achieved: Pressure End Time: 09:35 Procedural Pain: 0 Post Procedural Pain: 0 Response to Treatment: Procedure was  tolerated well Level of Consciousness Awake and Alert (Post-procedure): Post Debridement Measurements of Total Wound Length: (cm) 0.5 Width: (cm) 0.3 Depth: (cm) 0.2 Volume: (cm) 0.024 Character of Wound/Ulcer Post Debridement: Improved Severity of Tissue Post Debridement: Fat layer exposed Post Procedure Diagnosis Same as Pre-procedure Electronic Signature(s) Signed: 02/15/2018 4:47:13 PM By: Montey Hora Signed: 02/17/2018 1:01:02 AM By: Worthy Keeler PA-C Entered By: Montey Hora on 02/15/2018 09:34:40 Streed, [name] ([id number]) -------------------------------------------------------------------------------- HPI Details Patient Name: [name]. Date of Service: 02/15/2018 9:00 AM Medical Record Number: [id number] Patient Account Number: [id number] Date of Birth/Sex: April 27, 1950 (67 y.o. M) Treating RN: Montey Hora Primary Care Provider: Benita Stabile Other Clinician: Referring Provider: Benita Stabile Treating Provider/Extender: STONE III, HOYT Weeks in Treatment: 1 History of Present Illness Associated Signs and Symptoms: Patient has a history of diabetes mellitus type II, hypertension, and atrial fibrillation. He also has a hammertoe deformity of the bilateral feet which seems to be causing pressure in the ball of his foot. HPI Description: 03/09/17 on evaluation today patient presents for his initial evaluation concerning an ulcer on the plantar aspect of his right foot which has been open he tells me for about three weeks. He has been seen at [facility] where they did perform debridement it appears according to a note on 02/22/17 where unfortunately it appears that his callous area began to break down into an ulcer after modifications have been added to his insulin. This had happened previously as well. However I do not have details of the severity of debridement at that point although it does not sound as if you the debridement was too significant based on  what the patient is telling me that he still had a lot of callous following debridement. With that being said they were going to look into altering his insoles to try to prevent further breakdown in the future. Nonetheless he has had foul odor discharge coming from the ulcer which is noted all the way back to that [facility] Patient states that this was concerning him more than anything else. He does have diabetes although he described this as "borderline diabetes" he is on metoprolol however along with lisinopril. Patient is not having any issues with pain in regard to his right plantar foot. 03/16/17 on evaluation today patient appears to be doing much better in regard to his plantar foot ulcer. He actually tells me that he loves the peg assist offloading shoe and that he hasn't had any pain in the ulcer area from the callous since I worked on it last week. Overall he is extremely happy with how things have progressed. Likewise the wound bed has no slough noted there is no evidence of infection and it looks excellent. I did receive the results of his hemoglobin A1c which showed a value of 5.9 which was elevated but actually rather well. Subsequently I did also receive the x-ray of his foot which showed diffuse degenerative change but no underlying acute bony abnormality. 03/23/17 on evaluation today patient appears to be doing better in regard to his right plantar foot ulcer. He continues to show signs of improvement there's definitely not as much drainage at this point. He has been tolerating the dressing changes without complication he is not happy with the peg assist offloading shoe but at the same time I do believe that it is making good progress as far as offloading is concerned. I still believe he may need to talk to a surgeon about surgically correcting a hammertoe in order to avoid additional pressure to the site especially since he Artie has diabetic shoes and they  do not seem to have prevented callous buildup in ulcer formation. 03/30/17 on evaluation today patient appears to be doing better in regard to his right plantar foot ulcer. Continues to show signs of improvement which is good news. Unfortunately we were unable to get the appointment with the orthopedic, Dr. Doran Durand, whom I recommended for him due to patient having a balance at the practice. With that being said patient's foot ulcer does seem to be doing much better on evaluation today he still has some depth to the wound but overall we are seeing improvement and epithelialization week by week. Hopefully this is something that will close shortly. 04/05/17 on evaluation today patient unfortunately has what appears to be an area of the plantar surface of his ulcer where he had fluid collection where the callous grew over top of the wound bed. Unfortunately there was some pus like material noted during cleansing that we did sent for culture today. Hopefully this is not truly an infection is or does not appear to be any evidence of erythema surrounding and maybe this is just simply a small setback with a fluid collection that has caused this issue. Nonetheless we will see what that shows when we get the culture back. I am also going to have it completely antibiotic which I previously placed him on anyway which will hopefully prevent any true infection from setting in. 04/12/17 on evaluation today patient's ulcer on the plantar aspect of his right foot actually appears to be better then during last weeks evaluation. In general he has been tolerating the dressing changes in utilizing the offloading shoe which does seem to be beneficial for him. With that being said he still seems to get some pressure to the area just not nearly as much as he previously had noted. Again fortunately there is no significant pain. LUMAN, HOLWAY ([id number]) 04/19/17 on evaluation  today patient appears to be doing excellent in  regard to his right foot ulcer. He has been tolerating the dressing changes without complication. There really has not been any drainage over the past few days he tells me. With that being said he seems to be doing excellent in my opinion at this point he does have some callous buildup. 04/26/17 On evaluation today patient's wound appears to be completely healed which is great news. He has been tolerating the dressing changes without complication and I do think he has done very well in regard to the healing process and offloading he has listed everything that that we instructed him to do. Obviously this appears to have paid off and he has progressed very nicely. Readmission: 07/05/17 on evaluation today patient is seen for fault evaluation and our clinic concerning the same issue that I have previously treated him for ending back in April 2019. This is a callous region with subsequently an ulcer underneath this on the plantar aspect of his right second metatarsal region. Fortunately he's not having any significant discomfort although his girlfriend told him that she noted an area of bruising at the site underneath the callous and wanted him to come get this checked out. He was very please with our care previous and therefore was more than happy to come let us check this for him. Upon inspection initially he did have significant callous overlying the area in question. It was not easily identifiable as far as any ulcer was concerned. With that being said he with this significant callous did require some sharp debridement and following debridement there did appear to be a small ulcer underlying. Fortunately patient in general shows no signs of infection at this point. He again has neuropathy and therefore does not have any ongoing discomfort or pain at this point. 07/12/17 on evaluation today patient actually appears to be doing very well in regard to his plantar foot ulcer. In fact this appears to be  completely healed and there's no residual opening at this time. Overall I'm very pleased in this regard. Patient also is extremely pleased. Readmission: 02/08/18 patient presents today for follow-up evaluation he has previously been seen here in our office for the same issue. Fortunately there is not been any evidence of infection since I last saw him he does note that he is been going to see his local podiatrist for callous pairing. During that time they actually noted that he had an open wound and subsequently referred him back ties for evaluation of this wound. He has done very well in the past with good wound care often healing in just a few weeks. No fevers, chills, nausea, or vomiting noted at this time. 02/15/18 on evaluation today patient actually appears to be doing very well in regard to his plantar foot ulcer. He is been tolerating the dressing changes without complication. There does appear to be some eschar of the line the wound surface. Fortunately I am happy with how things are progressing from the standpoint of the wound progress from last week to this week. Electronic Signature(s) Signed: 02/17/2018 1:01:02 AM By: Worthy Keeler PA-C Entered By: Worthy Keeler on 02/15/2018 10:21:50 [place], [name] ([id number]) -------------------------------------------------------------------------------- Physical Exam Details Patient Name: LONDYN, WOTTON. Date of Service: 02/15/2018 9:00 AM Medical Record Number: [id number] Patient Account Number: [id number] Date of Birth/Sex: [birth year] (67 y.o. M) Treating RN: Montey Hora Primary Care Provider: Benita Stabile Other Clinician: Referring Provider: Benita Stabile Treating Provider/Extender: Melburn Hake, HOYT  Weeks in Treatment: 1 Constitutional Well-nourished and well-hydrated in no acute distress. Respiratory normal breathing without difficulty. Psychiatric this patient is able to make decisions and demonstrates good insight into disease  process. Alert and Oriented x 3. pleasant and cooperative. Notes Upon evaluation today patient's wound bed shows signs of excellent epithelialization which is great news. He has good granulation as well. Overall there was very little callous buildup compared to last week. Unless I do feel like things are shown signs of improvement which is excellent news. The patient is also please. Electronic Signature(s) Signed: 02/17/2018 1:01:02 AM By: Worthy Keeler PA-C Entered By: Worthy Keeler on 02/15/2018 10:22:23 KEMO, SPRUCE ([id number]) -------------------------------------------------------------------------------- Physician Orders Details Patient Name: JUANDAVID, DALLMAN. Date of Service: 02/15/2018 9:00 AM Medical Record Number: [id number] Patient Account Number: [id number] Date of Birth/Sex: [birth year] (67 y.o. M) Treating RN: Montey Hora Primary Care Provider: Benita Stabile Other Clinician: Referring Provider: Benita Stabile Treating Provider/Extender: Melburn Hake, HOYT Weeks in Treatment: 1 Verbal / Phone Orders: No Diagnosis Coding ICD-10 Coding Code Description E11.621 Type 2 diabetes mellitus with foot ulcer L97.512 Non-pressure chronic ulcer of other part of right foot with fat layer exposed I10 Essential (primary) hypertension I48.20 Chronic atrial fibrillation, unspecified Wound Cleansing Wound #3 Right,Proximal,Plantar Foot o Clean wound with Normal Saline. Anesthetic (add to Medication List) Wound #3 Right,Proximal,Plantar Foot o Topical Lidocaine 4% cream applied to wound bed prior to debridement (In Clinic Only). Primary Wound Dressing Wound #3 Right,Proximal,Plantar Foot o Silver Collagen Secondary Dressing Wound #3 [place] Dressing Change Frequency Wound #3 Right,Proximal,Plantar Foot o Change dressing every other day. Follow-up Appointments Wound #3 Right,Proximal,Plantar Foot o Return Appointment in 1  week. Off-Loading Wound #3 Right,Proximal,Plantar Foot o Open toe surgical shoe to: - right foot Electronic Signature(s) Signed: 02/15/2018 4:47:13 PM By: Montey Hora Signed: 02/17/2018 1:01:02 AM By: Worthy Keeler PA-C Entered By: Montey Hora on 02/15/2018 09:35:05 Morocho, ZYMERE PATLAN ([id number]) DAVIYON, WIDMAYER ([id number]) -------------------------------------------------------------------------------- Problem List Details Patient Name: GIOVANIE, LEFEBRE. Date of Service: 02/15/2018 9:00 AM Medical Record Number: [id number] Patient Account Number: [id number] Date of Birth/Sex: [birth year] (67 y.o. M) Treating RN: Montey Hora Primary Care Provider: Benita Stabile Other Clinician: Referring Provider: Benita Stabile Treating Provider/Extender: Melburn Hake, HOYT Weeks in Treatment: 1 Active Problems ICD-10 Evaluated Encounter Code Description Active Date Today Diagnosis E11.621 Type 2 diabetes mellitus with foot ulcer 02/08/2018 No Yes L97.512 Non-pressure chronic ulcer of other part of right foot with fat 02/08/2018 No Yes layer exposed I10 Essential (primary) hypertension 02/08/2018 No Yes I48.20 Chronic atrial fibrillation, unspecified 02/08/2018 No Yes Inactive Problems Resolved Problems Electronic Signature(s) Signed: 02/17/2018 1:01:02 AM By: Worthy Keeler PA-C Entered By: Worthy Keeler on 02/15/2018 09:27:06 Laster, [name] ([id number]) -------------------------------------------------------------------------------- Progress Note Details Patient Name: [name]. Date of Service: 02/15/2018 9:00 AM Medical Record Number: [id number] Patient Account Number: [id number] Date of Birth/Sex: 05/11/1950 (67 y.o. M) Treating RN: Montey Hora Primary Care Provider: Benita Stabile Other Clinician: Referring Provider: Benita Stabile Treating Provider/Extender: Melburn Hake, HOYT Weeks in Treatment: 1 Subjective Chief Complaint Information obtained from Patient Right plantar foot  ulcer History of Present Illness (HPI) The following HPI elements were documented for the patient's wound: Associated Signs and Symptoms: Patient has a history of diabetes mellitus type II, hypertension, and atrial fibrillation. He also has a hammertoe deformity of the bilateral feet which seems to be causing pressure in the ball of his foot. 03/09/17 on evaluation today  patient presents for his initial evaluation concerning an ulcer on the plantar aspect of his right foot which has been open he tells me for about three weeks. He has been seen at [facility] where they did perform debridement it appears according to a note on 02/22/17 where unfortunately it appears that his callous area began to break down into an ulcer after modifications have been added to his insulin. This had happened previously as well. However I do not have details of the severity of debridement at that point although it does not sound as if you the debridement was too significant based on what the patient is telling me that he still had a lot of callous following debridement. With that being said they were going to look into altering his insoles to try to prevent further breakdown in the future. Nonetheless he has had foul odor discharge coming from the ulcer which is noted all the way back to that [facility] Patient states that this was concerning him more than anything else. He does have diabetes although he described this as "borderline diabetes" he is on metoprolol however along with lisinopril. Patient is not having any issues with pain in regard to his right plantar foot. 03/16/17 on evaluation today patient appears to be doing much better in regard to his plantar foot ulcer. He actually tells me that he loves the peg assist offloading shoe and that he hasn't had any pain in the ulcer area from the callous since I worked on it last week. Overall he is extremely happy with how things have  progressed. Likewise the wound bed has no slough noted there is no evidence of infection and it looks excellent. I did receive the results of his hemoglobin A1c which showed a value of 5.9 which was elevated but actually rather well. Subsequently I did also receive the x-ray of his foot which showed diffuse degenerative change but no underlying acute bony abnormality. 03/23/17 on evaluation today patient appears to be doing better in regard to his right plantar foot ulcer. He continues to show signs of improvement there's definitely not as much drainage at this point. He has been tolerating the dressing changes without complication he is not happy with the peg assist offloading shoe but at the same time I do believe that it is making good progress as far as offloading is concerned. I still believe he may need to talk to a surgeon about surgically correcting a hammertoe in order to avoid additional pressure to the site especially since he Artie has diabetic shoes and they do not seem to have prevented callous buildup in ulcer formation. 03/30/17 on evaluation today patient appears to be doing better in regard to his right plantar foot ulcer. Continues to show signs of improvement which is good news. Unfortunately we were unable to get the appointment with the orthopedic, Dr. Doran Durand, whom I recommended for him due to patient having a balance at the practice. With that being said patient's foot ulcer does seem to be doing much better on evaluation today he still has some depth to the wound but overall we are seeing improvement and epithelialization week by week. Hopefully this is something that will close shortly. 04/05/17 on evaluation today patient unfortunately has what appears to be an area of the plantar surface of his ulcer where he had fluid collection where the callous grew over top of the wound bed. Unfortunately there was some pus like material noted  during cleansing that we did sent for culture  today. Hopefully this is not truly an infection is or does not appear to be any ALVINO, LECHUGA. ([id number]) evidence of erythema surrounding and maybe this is just simply a small setback with a fluid collection that has caused this issue. Nonetheless we will see what that shows when we get the culture back. I am also going to have it completely antibiotic which I previously placed him on anyway which will hopefully prevent any true infection from setting in. 04/12/17 on evaluation today patient's ulcer on the plantar aspect of his right foot actually appears to be better then during last weeks evaluation. In general he has been tolerating the dressing changes in utilizing the offloading shoe which does seem to be beneficial for him. With that being said he still seems to get some pressure to the area just not nearly as much as he previously had noted. Again fortunately there is no significant pain. 04/19/17 on evaluation today patient appears to be doing excellent in regard to his right foot ulcer. He has been tolerating the dressing changes without complication. There really has not been any drainage over the past few days he tells me. With that being said he seems to be doing excellent in my opinion at this point he does have some callous buildup. 04/26/17 On evaluation today patient's wound appears to be completely healed which is great news. He has been tolerating the dressing changes without complication and I do think he has done very well in regard to the healing process and offloading he has listed everything that that we instructed him to do. Obviously this appears to have paid off and he has progressed very nicely. Readmission: 07/05/17 on evaluation today patient is seen for fault evaluation and our clinic concerning the same issue that I have previously treated him for ending back in April 2019. This is a callous region with subsequently an ulcer underneath this on the plantar aspect of his  right second metatarsal region. Fortunately he's not having any significant discomfort although his girlfriend told him that she noted an area of bruising at the site underneath the callous and wanted him to come get this checked out. He was very please with our care previous and therefore was more than happy to come let us check this for him. Upon inspection initially he did have significant callous overlying the area in question. It was not easily identifiable as far as any ulcer was concerned. With that being said he with this significant callous did require some sharp debridement and following debridement there did appear to be a small ulcer underlying. Fortunately patient in general shows no signs of infection at this point. He again has neuropathy and therefore does not have any ongoing discomfort or pain at this point. 07/12/17 on evaluation today patient actually appears to be doing very well in regard to his plantar foot ulcer. In fact this appears to be completely healed and there's no residual opening at this time. Overall I'm very pleased in this regard. Patient also is extremely pleased. Readmission: 02/08/18 patient presents today for follow-up evaluation he has previously been seen here in our office for the same issue. Fortunately there is not been any evidence of infection since I last saw him he does note that he is been going to see his local podiatrist for callous pairing. During that time they actually noted that he had an open wound and subsequently referred him back ties for  evaluation of this wound. He has done very well in the past with good wound care often healing in just a few weeks. No fevers, chills, nausea, or vomiting noted at this time. 02/15/18 on evaluation today patient actually appears to be doing very well in regard to his plantar foot ulcer. He is been tolerating the dressing changes without complication. There does appear to be some eschar of the line the wound  surface. Fortunately I am happy with how things are progressing from the standpoint of the wound progress from last week to this week. Patient History Information obtained from Patient. Family History Cancer - Siblings, Diabetes - Father, Heart Disease - Mother, Hypertension - Mother, No family history of Hereditary Spherocytosis, Kidney Disease, Lung Disease, Seizures, Stroke, Thyroid Problems, Tuberculosis. Social History Former smoker, Marital Status - Single, Alcohol Use - Never, Drug Use - No History, Caffeine Use - Moderate. Medical History RAINER, MOUNCE ([id number]) Eyes Denies history of Cataracts, Glaucoma, Optic Neuritis Ear/Nose/Mouth/Throat Denies history of Chronic sinus problems/congestion, Middle ear problems Hematologic/Lymphatic Denies history of Anemia, Hemophilia, Human Immunodeficiency Virus, Lymphedema, Sickle Cell Disease Respiratory Denies history of Aspiration, Asthma, Chronic Obstructive Pulmonary Disease (COPD), Pneumothorax, Sleep Apnea, Tuberculosis Cardiovascular Patient has history of Arrhythmia - a fib, Hypertension Denies history of Angina, Congestive Heart Failure, Coronary Artery Disease, Deep Vein Thrombosis, Hypotension, Myocardial Infarction, Peripheral Arterial Disease, Peripheral Venous Disease, Phlebitis, Vasculitis Gastrointestinal Denies history of Cirrhosis , Colitis, Crohn s, Hepatitis A, Hepatitis B, Hepatitis C Endocrine Patient has history of Type II Diabetes Denies history of Type I Diabetes Genitourinary Denies history of End Stage Renal Disease Immunological Denies history of Lupus Erythematosus, Raynaud s, Scleroderma Integumentary (Skin) Denies history of History of Burn, History of pressure wounds Musculoskeletal Patient has history of Osteoarthritis Denies history of Gout, Rheumatoid Arthritis, Osteomyelitis Neurologic Denies history of Dementia, Neuropathy, Quadriplegia, Paraplegia, Seizure Disorder Oncologic Denies  history of Received Chemotherapy, Received Radiation Medical And Surgical History Notes Cardiovascular High Cholesterol Review of Systems (ROS) Constitutional Symptoms (General Health) Denies complaints or symptoms of Fever, Chills. Respiratory The patient has no complaints or symptoms. Cardiovascular The patient has no complaints or symptoms. Psychiatric The patient has no complaints or symptoms. Objective Constitutional Well-nourished and well-hydrated in no acute distress. MAXEN, ROWLAND ([id number]) Vitals Time Taken: 8:55 AM, Height: 74 in, Weight: 240.9 lbs, BMI: 30.9, Temperature: 97.5 F, Pulse: 83 bpm, Respiratory Rate: 16 breaths/min, Blood Pressure: 148/98 mmHg. Respiratory normal breathing without difficulty. Psychiatric this patient is able to make decisions and demonstrates good insight into disease process. Alert and Oriented x 3. pleasant and cooperative. General Notes: Upon evaluation today patient's wound bed shows signs of excellent epithelialization which is great news. He has good granulation as well. Overall there was very little callous buildup compared to last week. Unless I do feel like things are shown signs of improvement which is excellent news. The patient is also please. Integumentary (Hair, Skin) Wound #3 status is Open. Original cause of wound was Footwear Injury. The wound is located on the Right,Proximal,Plantar Foot. The wound measures 0.1cm length x 0.1cm width x 0.1cm depth; 0.008cm^2 area and 0.001cm^3 volume. There is Fat Layer (Subcutaneous Tissue) Exposed exposed. There is no tunneling or undermining noted. There is a none present amount of drainage noted. The wound margin is indistinct and nonvisible. There is no granulation within the wound bed. There is a medium (34-66%) amount of necrotic tissue within the wound bed including Eschar. The periwound skin appearance exhibited: Callus. The periwound  skin appearance did not exhibit: Crepitus,  Excoriation, Induration, Rash, Scarring, Dry/Scaly, Maceration, Atrophie Blanche, Cyanosis, Ecchymosis, Hemosiderin Staining, Mottled, Pallor, Rubor, Erythema. Periwound temperature was noted as No Abnormality. Assessment Active Problems ICD-10 Type 2 diabetes mellitus with foot ulcer Non-pressure chronic ulcer of other part of right foot with fat layer exposed Essential (primary) hypertension Chronic atrial fibrillation, unspecified Procedures Wound #3 Pre-procedure diagnosis of Wound #3 is a Diabetic Wound/Ulcer of the Lower Extremity located on the Right,Proximal,Plantar Foot .Severity of Tissue Pre Debridement is: Fat layer exposed. There was a Excisional Skin/Subcutaneous Tissue Debridement with a total area of 0.01 sq cm performed by STONE III, HOYT E., PA-C. With the following instrument(s): Curette to remove Viable and Non-Viable tissue/material. Material removed includes Eschar, Callus, and Subcutaneous Tissue after achieving pain control using Lidocaine 4% Topical Solution. No specimens were taken. A time out was conducted at 09:29, prior to the start of the procedure. A Minimum amount of bleeding was controlled with Pressure. The procedure was tolerated well with a pain level of 0 throughout and a pain level of 0 following the procedure. Post Debridement Measurements: 0.5cm length x 0.3cm width x 0.2cm depth; 0.024cm^3 volume. Character of Wound/Ulcer Post Debridement is improved. Severity of Tissue Post Debridement is: Fat layer exposed. Post procedure Diagnosis Wound #3: Same as Pre-Procedure Cullen, Sanad J. ([id number]) Plan Wound Cleansing: Wound #3 Right,Proximal,Plantar Foot: Clean wound with Normal Saline. Anesthetic (add to Medication List): Wound #3 Right,Proximal,Plantar Foot: Topical Lidocaine 4% cream applied to wound bed prior to debridement (In Clinic Only). Primary Wound Dressing: Wound #3 Right,Proximal,Plantar Foot: Silver Collagen Secondary  Dressing: Wound #3 Right,Proximal,Plantar Foot: [place] Dressing Change Frequency: Wound #3 Right,Proximal,Plantar Foot: Change dressing every other day. Follow-up Appointments: Wound #3 Right,Proximal,Plantar Foot: Return Appointment in 1 week. Off-Loading: Wound #3 Right,Proximal,Plantar Foot: Open toe surgical shoe to: - right foot I'm gonna recommend that we continue with the above wound care measures for the next week. He's in agreement the plan. Anything changes or worsens in the meantime he will contact the office and let me know. Please see above for specific wound care orders. We will see patient for re-evaluation in 1 week(s) here in the clinic. If anything worsens or changes patient will contact our office for additional recommendations. Electronic Signature(s) Signed: 02/17/2018 1:01:02 AM By: Worthy Keeler PA-C Entered By: Worthy Keeler on 02/15/2018 10:22:34 MACY, LINGENFELTER ([id number]) -------------------------------------------------------------------------------- ROS/PFSH Details Patient Name: TAJH, LIVSEY. Date of Service: 02/15/2018 9:00 AM Medical Record Number: [id number] Patient Account Number: [id number] Date of Birth/Sex: [birth year] (67 y.o. M) Treating RN: Montey Hora Primary Care Provider: Benita Stabile Other Clinician: Referring Provider: Benita Stabile Treating Provider/Extender: STONE III, HOYT Weeks in Treatment: 1 Information Obtained From Patient Wound History Do you currently have one or more open woundso Yes How many open wounds do you currently haveo 1 Approximately how long have you had your woundso 2 weeks How have you been treating your wound(s) until nowo silvercell Has your wound(s) ever healed and then re-openedo Yes Have you had any lab work done in the past montho Yes Who ordered the lab work doneo Benita Stabile PCP Have you tested positive for an antibiotic resistant organism (MRSA, VRE)o No Have you tested positive for  osteomyelitis (bone infection)o No Have you had any tests for circulation on your legso Yes Constitutional Symptoms (General Health) Complaints and Symptoms: Negative for: Fever; Chills Eyes Medical History: Negative for: Cataracts; Glaucoma; Optic Neuritis Ear/Nose/Mouth/Throat Medical History: Negative for: Chronic  sinus problems/congestion; Middle ear problems Hematologic/Lymphatic Medical History: Negative for: Anemia; Hemophilia; Human Immunodeficiency Virus; Lymphedema; Sickle Cell Disease Respiratory Complaints and Symptoms: No Complaints or Symptoms Medical History: Negative for: Aspiration; Asthma; Chronic Obstructive Pulmonary Disease (COPD); Pneumothorax; Sleep Apnea; Tuberculosis Cardiovascular Complaints and Symptoms: No Complaints or Symptoms Medical History: DAISUKE, BAILEY ([id number]) Positive for: Arrhythmia - a fib; Hypertension Negative for: Angina; Congestive Heart Failure; Coronary Artery Disease; Deep Vein Thrombosis; Hypotension; Myocardial Infarction; Peripheral Arterial Disease; Peripheral Venous Disease; Phlebitis; Vasculitis Past Medical History Notes: High Cholesterol Gastrointestinal Medical History: Negative for: Cirrhosis ; Colitis; Crohnos; Hepatitis A; Hepatitis B; Hepatitis C Endocrine Medical History: Positive for: Type II Diabetes Negative for: Type I Diabetes Time with diabetes: 3 years Treated with: Oral agents Blood sugar tested every day: No Genitourinary Medical History: Negative for: End Stage Renal Disease Immunological Medical History: Negative for: Lupus Erythematosus; Raynaudos; Scleroderma Integumentary (Skin) Medical History: Negative for: History of Burn; History of pressure wounds Musculoskeletal Medical History: Positive for: Osteoarthritis Negative for: Gout; Rheumatoid Arthritis; Osteomyelitis Neurologic Medical History: Negative for: Dementia; Neuropathy; Quadriplegia; Paraplegia; Seizure  Disorder Oncologic Medical History: Negative for: Received Chemotherapy; Received Radiation Psychiatric Complaints and Symptoms: No Complaints or Symptoms Immunizations Pneumococcal Vaccine: NEKHI, LIWANAG ([id number]) Received Pneumococcal Vaccination: Yes Immunization Notes: up to date Implantable Devices Family and Social History Cancer: Yes - Siblings; Diabetes: Yes - Father; Heart Disease: Yes - Mother; Hereditary Spherocytosis: No; Hypertension: Yes - Mother; Kidney Disease: No; Lung Disease: No; Seizures: No; Stroke: No; Thyroid Problems: No; Tuberculosis: No; Former smoker; Marital Status - Single; Alcohol Use: Never; Drug Use: No History; Caffeine Use: Moderate; Financial Concerns: No; Food, Clothing or Shelter Needs: No; Support System Lacking: No; Transportation Concerns: No; Advanced Directives: No; Patient does not want information on Advanced Directives; Do not resuscitate: No; Living Will: No; Medical Power of Attorney: No Physician Affirmation I have reviewed and agree with the above information. Electronic Signature(s) Signed: 02/15/2018 4:47:13 PM By: Montey Hora Signed: 02/17/2018 1:01:02 AM By: Worthy Keeler PA-C Entered By: Worthy Keeler on 02/15/2018 10:22:10 Laduke, [name] ([id number]) -------------------------------------------------------------------------------- SuperBill Details Patient Name: ISHAAQ, PENNA. Date of Service: 02/15/2018 Medical Record Number: [id number] Patient Account Number: [id number] Date of Birth/Sex: Jul 20, 1950 (68 y.o. M) Treating RN: Montey Hora Primary Care Provider: Benita Stabile Other Clinician: Referring Provider: Benita Stabile Treating Provider/Extender: Melburn Hake, HOYT Weeks in Treatment: 1 Diagnosis Coding ICD-10 Codes Code Description E11.621 Type 2 diabetes mellitus with foot ulcer L97.512 Non-pressure chronic ulcer of other part of right foot with fat layer exposed I10 Essential (primary) hypertension I48.20  Chronic atrial fibrillation, unspecified Facility Procedures CPT4 Code: 19379024 Description: 11042 - DEB SUBQ TISSUE 20 SQ CM/< ICD-10 Diagnosis Description L97.512 Non-pressure chronic ulcer of other part of right foot with fa Modifier: t layer exposed Quantity: 1 Physician Procedures CPT4 Code: 0973532 Description: 99242 - WC PHYS SUBQ TISS 20 SQ CM ICD-10 Diagnosis Description L97.512 Non-pressure chronic ulcer of other part of right foot with fa Modifier: t layer exposed Quantity: 1 Electronic Signature(s) Signed: 02/17/2018 1:01:02 AM By: Worthy Keeler PA-C Entered By: Worthy Keeler on 02/15/2018 10:22:43

## 2018-02-17 NOTE — Progress Notes (Signed)
[name], [name] ([id number]) Visit Report for 02/15/2018 Arrival Information Details Patient Name: [name], [name]. Date of Service: 02/15/2018 9:00 AM Medical Record Number: [id number] Patient Account Number: [id number] Date of Birth/Sex: 02/26/1950 (67 y.o. M) Treating RN: Secundino Ginger Primary Care Natasia Sanko: Benita Stabile Other Clinician: Referring Stephie Xu: Benita Stabile Treating Carlia Bomkamp/Extender: Melburn Hake, HOYT Weeks in Treatment: 1 Visit Information History Since Last Visit Added or deleted any medications: No Patient Arrived: Ambulatory Any new allergies or adverse reactions: No Arrival Time: 08:54 Had a fall or experienced change in No Accompanied By: self activities of daily living that may affect Transfer Assistance: None risk of falls: Patient Has Alerts: Yes Signs or symptoms of abuse/neglect since last visito No Patient Alerts: Patient on Blood Thinner Hospitalized since last visit: No 03/02/17 Implantable device outside of the clinic excluding No ABI (L) 1.14 (R) 1.20 cellular tissue based products placed in the center 81MG  aspirin since last visit: Has Compression in Place as Prescribed: Yes Pain Present Now: No Electronic Signature(s) Signed: 02/15/2018 11:27:43 AM By: Secundino Ginger Entered By: Secundino Ginger on 02/15/2018 08:55:01 Darko, [name] ([id number]) -------------------------------------------------------------------------------- Encounter Discharge Information Details Patient Name: [name]. Date of Service: 02/15/2018 9:00 AM Medical Record Number: [id number] Patient Account Number: [id number] Date of Birth/Sex: [birth year] (67 y.o. M) Treating RN: Montey Hora Primary Care Sanvika Cuttino: Benita Stabile Other Clinician: Referring Alfretta Pinch: TATE, Sharlet Salina Treating Olney Monier/Extender: Melburn Hake, HOYT Weeks in Treatment: 1 Encounter Discharge Information Items Post Procedure Vitals Discharge Condition: Stable Temperature (F): 97.5 Ambulatory Status:  Ambulatory Pulse (bpm): 83 Discharge Destination: Home Respiratory Rate (breaths/min): 16 Transportation: Private Auto Blood Pressure (mmHg): 148/98 Accompanied By: self Schedule Follow-up Appointment: Yes Clinical Summary of Care: Electronic Signature(s) Signed: 02/15/2018 4:47:13 PM By: Montey Hora Entered By: Montey Hora on 02/15/2018 09:36:20 Mazzarella, [name] ([id number]) -------------------------------------------------------------------------------- Lower Extremity Assessment Details Patient Name: [name]. Date of Service: 02/15/2018 9:00 AM Medical Record Number: [id number] Patient Account Number: [id number] Date of Birth/Sex: 1950-10-04 (67 y.o. M) Treating RN: Secundino Ginger Primary Care Blase Beckner: Benita Stabile Other Clinician: Referring Yazmin Locher: Benita Stabile Treating Oniel Meleski/Extender: STONE III, HOYT Weeks in Treatment: 1 Edema Assessment Assessed: [Left: No] [Right: No] Edema: [Left: N] [Right: o] Calf Left: Right: Point of Measurement: 35 cm From Medial Instep cm 38 cm Ankle Left: Right: Point of Measurement: 14 cm From Medial Instep cm 24 cm Vascular Assessment Claudication: Claudication Assessment [Right:None] Pulses: Dorsalis Pedis Palpable: [Right:Yes] Posterior Tibial Extremity colors, hair growth, and conditions: Extremity Color: [Right:Normal] Hair Growth on Extremity: [Right:No] Temperature of Extremity: [Right:Warm] Capillary Refill: [Right:< 3 seconds] Toe Nail Assessment Left: Right: Thick: Yes Discolored: No Deformed: No Improper Length and Hygiene: No Electronic Signature(s) Signed: 02/15/2018 11:27:43 AM By: Secundino Ginger Entered By: Secundino Ginger on 02/15/2018 09:01:06 Kegley, [name] ([id number]) -------------------------------------------------------------------------------- Multi Wound Chart Details Patient Name: [name]. Date of Service: 02/15/2018 9:00 AM Medical Record Number: [id number] Patient Account Number:  [id number] Date of Birth/Sex: [birth year] (67 y.o. M) Treating RN: Montey Hora Primary Care Shadrick Senne: Benita Stabile Other Clinician: Referring Tristina Sahagian: TATE, Sharlet Salina Treating Emiah Pellicano/Extender: STONE III, HOYT Weeks in Treatment: 1 Vital Signs Height(in): 74 Pulse(bpm): 83 Weight(lbs): 240.9 Blood Pressure(mmHg): 148/98 Body Mass Index(BMI): 31 Temperature(F): 97.5 Respiratory Rate 16 (breaths/min): Photos: [N/A:N/A] Wound Location: Right Foot - Plantar, Proximal N/A N/A Wounding Event: Footwear Injury N/A N/A Primary Etiology: Diabetic Wound/Ulcer of the N/A N/A Lower Extremity Comorbid History: Arrhythmia, Hypertension, N/A N/A Type II Diabetes, Osteoarthritis Date Acquired: 01/25/2018 N/A N/A Weeks of  Treatment: 1 N/A N/A Wound Status: Open N/A N/A Measurements L x W x D 0.1x0.1x0.1 N/A N/A (cm) Area (cm) : 0.008 N/A N/A Volume (cm) : 0.001 N/A N/A % Reduction in Area: 93.70% N/A N/A % Reduction in Volume: 99.10% N/A N/A Classification: Grade 2 N/A N/A Exudate Amount: None Present N/A N/A Wound Margin: Indistinct, nonvisible N/A N/A Granulation Amount: None Present (0%) N/A N/A Necrotic Amount: Medium (34-66%) N/A N/A Necrotic Tissue: Eschar N/A N/A Exposed Structures: Fat Layer (Subcutaneous N/A N/A Tissue) Exposed: Yes Fascia: No Tendon: No Muscle: No Joint: No Bone: No Saenz, Ruffus J. ([id number]) Epithelialization: None N/A N/A Periwound Skin Texture: Callus: Yes N/A N/A Excoriation: No Induration: No Crepitus: No Rash: No Scarring: No Periwound Skin Moisture: Maceration: No N/A N/A Dry/Scaly: No Periwound Skin Color: Atrophie Blanche: No N/A N/A Cyanosis: No Ecchymosis: No Erythema: No Hemosiderin Staining: No Mottled: No Pallor: No Rubor: No Temperature: No Abnormality N/A N/A Tenderness on Palpation: No N/A N/A Wound Preparation: Ulcer Cleansing: N/A N/A Rinsed/Irrigated with Saline Topical Anesthetic Applied: Other: lidocaine  4% Treatment Notes Electronic Signature(s) Signed: 02/15/2018 4:47:13 PM By: Montey Hora Entered By: Montey Hora on 02/15/2018 30:16:01 [name] ([id number]) -------------------------------------------------------------------------------- Gallant Details Patient Name: JOVAUN, LEVENE. Date of Service: 02/15/2018 9:00 AM Medical Record Number: [id number] Patient Account Number: [id number] Date of Birth/Sex: March 28, 1950 (67 y.o. M) Treating RN: Montey Hora Primary Care Samanth Mirkin: Benita Stabile Other Clinician: Referring Dhruv Christina: Benita Stabile Treating Treyven Lafauci/Extender: Melburn Hake, HOYT Weeks in Treatment: 1 Active Inactive Abuse / Safety / Falls / Self Care Management Nursing Diagnoses: Potential for falls Goals: Patient will not experience any injury related to falls Date Initiated: 02/08/2018 Target Resolution Date: 04/23/2018 Goal Status: Active Interventions: Assess fall risk on admission and as needed Notes: Orientation to the Wound Care Program Nursing Diagnoses: Knowledge deficit related to the wound healing center program Goals: Patient/caregiver will verbalize understanding of the [facility] Date Initiated: 02/08/2018 Target Resolution Date: 04/23/2018 Goal Status: Active Interventions: Provide education on orientation to the wound center Notes: Wound/Skin Impairment Nursing Diagnoses: Impaired tissue integrity Goals: Ulcer/skin breakdown will heal within 14 weeks Date Initiated: 02/08/2018 Target Resolution Date: 04/23/2018 Goal Status: Active Interventions: Assess patient/caregiver ability to obtain necessary supplies GUILHERME, SCHWENKE ([id number]) Assess patient/caregiver ability to perform ulcer/skin care regimen upon admission and as needed Assess ulceration(s) every visit Notes: Electronic Signature(s) Signed: 02/15/2018 4:47:13 PM By: Montey Hora Entered By: Montey Hora on 02/15/2018 09:29:14 Roessner, [name] ([id number]) -------------------------------------------------------------------------------- Pain Assessment Details Patient Name: [name]. Date of Service: 02/15/2018 9:00 AM Medical Record Number: [id number] Patient Account Number: [id number] Date of Birth/Sex: 1950-09-24 (67 y.o. M) Treating RN: Secundino Ginger Primary Care Jaylun Fleener: Benita Stabile Other Clinician: Referring Kalden Wanke: Benita Stabile Treating Vinton Layson/Extender: STONE III, HOYT Weeks in Treatment: 1 Active Problems Location of Pain Severity and Description of Pain Patient Has Paino No Site Locations Pain Management and Medication Current Pain Management: Notes pt denies any pain at this time. Electronic Signature(s) Signed: 02/15/2018 11:27:43 AM By: Secundino Ginger Entered By: Secundino Ginger on 02/15/2018 08:55:19 Moldovan, [name] ([id number]) -------------------------------------------------------------------------------- Patient/Caregiver Education Details Patient Name: [name] Date of Service: 02/15/2018 9:00 AM Medical Record Number: [id number] Patient Account Number: [id number] Date of Birth/Gender: [birth year] (67 y.o. M) Treating RN: Montey Hora Primary Care Physician: Benita Stabile Other Clinician: Referring Physician: Benita Stabile Treating Physician/Extender: Sharalyn Ink in Treatment: 1 Education Assessment Education Provided To: Patient Education Topics Provided Wound/Skin  Impairment: Handouts: Other: wound care as ordered Methods: Demonstration, Explain/Verbal Responses: State content correctly Electronic Signature(s) Signed: 02/15/2018 4:47:13 PM By: Montey Hora Entered By: Montey Hora on 02/15/2018 09:36:35 Thomure, [name] ([id number]) -------------------------------------------------------------------------------- Wound Assessment Details Patient Name: [name]. Date of Service: 02/15/2018 9:00 AM Medical Record Number: [id number] Patient Account Number: [id number] Date of  Birth/Sex: 1950/12/27 (67 y.o. M) Treating RN: Secundino Ginger Primary Care Nahla Lukin: Benita Stabile Other Clinician: Referring Enes Wegener: TATE, Sharlet Salina Treating Kiandre Spagnolo/Extender: STONE III, HOYT Weeks in Treatment: 1 Wound Status Wound Number: 3 Primary Diabetic Wound/Ulcer of the Lower Extremity Etiology: Wound Location: Right Foot - Plantar, Proximal Wound Status: Open Wounding Event: Footwear Injury Comorbid Arrhythmia, Hypertension, Type II Diabetes, Date Acquired: 01/25/2018 History: Osteoarthritis Weeks Of Treatment: 1 Clustered Wound: No Photos Photo Uploaded By: Secundino Ginger on 02/15/2018 09:21:51 Wound Measurements Length: (cm) 0.1 Width: (cm) 0.1 Depth: (cm) 0.1 Area: (cm) 0.008 Volume: (cm) 0.001 % Reduction in Area: 93.7% % Reduction in Volume: 99.1% Epithelialization: None Tunneling: No Undermining: No Wound Description Classification: Grade 2 Wound Margin: Indistinct, nonvisible Exudate Amount: None Present Foul Odor After Cleansing: No Slough/Fibrino No Wound Bed Granulation Amount: None Present (0%) Exposed Structure Necrotic Amount: Medium (34-66%) Fascia Exposed: No Necrotic Quality: Eschar Fat Layer (Subcutaneous Tissue) Exposed: Yes Tendon Exposed: No Muscle Exposed: No Joint Exposed: No Bone Exposed: No Periwound Skin Texture Texture Color No Abnormalities Noted: No No Abnormalities Noted: No Fanfan, Jacksen J. ([id number]) Callus: Yes Atrophie Blanche: No Crepitus: No Cyanosis: No Excoriation: No Ecchymosis: No Induration: No Erythema: No Rash: No Hemosiderin Staining: No Scarring: No Mottled: No Pallor: No Moisture Rubor: No No Abnormalities Noted: No Dry / Scaly: No Temperature / Pain Maceration: No Temperature: No Abnormality Wound Preparation Ulcer Cleansing: Rinsed/Irrigated with Saline Topical Anesthetic Applied: Other: lidocaine 4%, Treatment Notes Wound #3 (Right, Proximal, Plantar Foot) Notes prisma, [place]; patient  has offloading shoe Electronic Signature(s) Signed: 02/15/2018 11:27:43 AM By: Secundino Ginger Entered By: Secundino Ginger on 02/15/2018 08:58:43 Mishkin, [name] ([id number]) -------------------------------------------------------------------------------- Vitals Details Patient Name: [name]. Date of Service: 02/15/2018 9:00 AM Medical Record Number: [id number] Patient Account Number: [id number] Date of Birth/Sex: 1950/03/11 (67 y.o. M) Treating RN: Secundino Ginger Primary Care Marabeth Melland: Benita Stabile Other Clinician: Referring Liat Mayol: TATE, Sharlet Salina Treating Charnita Trudel/Extender: STONE III, HOYT Weeks in Treatment: 1 Vital Signs Time Taken: 08:55 Temperature (F): 97.5 Height (in): 74 Pulse (bpm): 83 Weight (lbs): 240.9 Respiratory Rate (breaths/min): 16 Body Mass Index (BMI): 30.9 Blood Pressure (mmHg): 148/98 Reference Range: 80 - 120 mg / dl Airway Electronic Signature(s) Signed: 02/15/2018 11:27:43 AM By: Secundino Ginger Entered BySecundino Ginger on 02/15/2018 08:57:38

## 2018-02-22 ENCOUNTER — Encounter: Payer: PPO | Attending: Physician Assistant | Admitting: Physician Assistant

## 2018-02-22 DIAGNOSIS — I482 Chronic atrial fibrillation, unspecified: Secondary | ICD-10-CM | POA: Insufficient documentation

## 2018-02-22 DIAGNOSIS — Z8249 Family history of ischemic heart disease and other diseases of the circulatory system: Secondary | ICD-10-CM | POA: Diagnosis not present

## 2018-02-22 DIAGNOSIS — M1990 Unspecified osteoarthritis, unspecified site: Secondary | ICD-10-CM | POA: Insufficient documentation

## 2018-02-22 DIAGNOSIS — I10 Essential (primary) hypertension: Secondary | ICD-10-CM | POA: Diagnosis not present

## 2018-02-22 DIAGNOSIS — L97512 Non-pressure chronic ulcer of other part of right foot with fat layer exposed: Secondary | ICD-10-CM | POA: Diagnosis not present

## 2018-02-22 DIAGNOSIS — Z87891 Personal history of nicotine dependence: Secondary | ICD-10-CM | POA: Diagnosis not present

## 2018-02-22 DIAGNOSIS — Z09 Encounter for follow-up examination after completed treatment for conditions other than malignant neoplasm: Secondary | ICD-10-CM | POA: Diagnosis not present

## 2018-02-22 DIAGNOSIS — E11621 Type 2 diabetes mellitus with foot ulcer: Secondary | ICD-10-CM | POA: Diagnosis not present

## 2018-02-22 DIAGNOSIS — E119 Type 2 diabetes mellitus without complications: Secondary | ICD-10-CM | POA: Diagnosis not present

## 2018-02-23 NOTE — Progress Notes (Signed)
FRIEND, DORFMAN ([id number]) Visit Report for 02/22/2018 Chief Complaint Document Details Patient Name: [name], [name]. Date of Service: 02/22/2018 8:15 AM Medical Record Number: [id number] Patient Account Number: [id number] Date of Birth/Sex: July 07, 1950 (67 y.o. M) Treating RN: Montey Hora Primary Care Provider: Benita Stabile Other Clinician: Referring Provider: Benita Stabile Treating Provider/Extender: Melburn Hake, [name] [name] in Treatment: 2 Information Obtained from: Patient Chief Complaint Right plantar foot ulcer Electronic Signature(s) Signed: 02/22/2018 5:55:30 PM By: Worthy Keeler PA-C Entered By: Worthy Keeler on 02/22/2018 08:09:50 Stice, [name] ([id number]) -------------------------------------------------------------------------------- Debridement Details Patient Name: [name]. Date of Service: 02/22/2018 8:15 AM Medical Record Number: [id number] Patient Account Number: [id number] Date of Birth/Sex: February 08, 1950 (67 y.o. M) Treating RN: Montey Hora Primary Care Provider: Benita Stabile Other Clinician: Referring Provider: Benita Stabile Treating Provider/Extender: Melburn Hake, [name] [name] in Treatment: 2 Debridement Performed for Wound #3 Right,Proximal,Plantar Foot Assessment: Performed By: Physician [name], [name] E., PA-C Debridement Type: Debridement Severity of Tissue Pre Fat layer exposed Debridement: Level of Consciousness (Pre- Awake and Alert procedure): Pre-procedure Verification/Time Yes - 08:44 Out Taken: Start Time: 08:44 Pain Control: Lidocaine 4% Topical Solution Total Area Debrided (L x W): 0.1 (cm) x 0.1 (cm) = 0.01 (cm) Tissue and other material Viable, Non-Viable, Callus, Slough, Subcutaneous, Slough, Other: adhered dressing debrided: Level: Skin/Subcutaneous Tissue Debridement Description: Excisional Instrument: Curette Bleeding: Minimum Hemostasis Achieved: Pressure End Time: 08:49 Procedural Pain: 0 Post Procedural Pain:  0 Response to Treatment: Procedure was tolerated well Level of Consciousness Awake and Alert (Post-procedure): Post Debridement Measurements of Total Wound Length: (cm) 0.2 Width: (cm) 0.2 Depth: (cm) 0.1 Volume: (cm) 0.003 Character of Wound/Ulcer Post Debridement: Improved Severity of Tissue Post Debridement: Fat layer exposed Post Procedure Diagnosis Same as Pre-procedure Electronic Signature(s) Signed: 02/22/2018 4:52:45 PM By: Montey Hora Signed: 02/22/2018 5:55:30 PM By: Worthy Keeler PA-C Entered By: Montey Hora on 02/22/2018 08:49:54 Dalzell, [name] ([id number]) -------------------------------------------------------------------------------- HPI Details Patient Name: [name]. Date of Service: 02/22/2018 8:15 AM Medical Record Number: [id number] Patient Account Number: [id number] Date of Birth/Sex: 1950-11-13 (67 y.o. M) Treating RN: Montey Hora Primary Care Provider: Benita Stabile Other Clinician: Referring Provider: Benita Stabile Treating Provider/Extender: Melburn Hake, [name] [name] in Treatment: 2 History of Present Illness Associated Signs and Symptoms: Patient has a history of diabetes mellitus type II, hypertension, and atrial fibrillation. He also has a hammertoe deformity of the bilateral feet which seems to be causing pressure in the ball of his foot. HPI Description: 03/09/17 on evaluation today patient presents for his initial evaluation concerning an ulcer on the plantar aspect of his right foot which has been open he tells me for about three [name]. He has been seen at [facility] where they did perform debridement it appears according to a note on 02/22/17 where unfortunately it appears that his callous area began to break down into an ulcer after modifications have been added to his insulin. This had happened previously as well. However I do not have details of the severity of debridement at that point although it does not sound as if you the  debridement was too significant based on what the patient is telling me that he still had a lot of callous following debridement. With that being said they were going to look into altering his insoles to try to prevent further breakdown in the future. Nonetheless he has had foul odor discharge coming from the ulcer which is noted all the way back to that  visit [facility]. Patient states that this was concerning him more than anything else. He does have diabetes although he described this as "borderline diabetes" he is on metoprolol however along with lisinopril. Patient is not having any issues with pain in regard to his right plantar foot. 03/16/17 on evaluation today patient appears to be doing much better in regard to his plantar foot ulcer. He actually tells me that he loves the peg assist offloading shoe and that he hasn't had any pain in the ulcer area from the callous since I worked on it last week. Overall he is extremely happy with how things have progressed. Likewise the wound bed has no slough noted there is no evidence of infection and it looks excellent. I did receive the results of his hemoglobin A1c which showed a value of 5.9 which was elevated but actually rather well. Subsequently I did also receive the x-ray of his foot which showed diffuse degenerative change but no underlying acute bony abnormality. 03/23/17 on evaluation today patient appears to be doing better in regard to his right plantar foot ulcer. He continues to show signs of improvement there's definitely not as much drainage at this point. He has been tolerating the dressing changes without complication he is not happy with the peg assist offloading shoe but at the same time I do believe that it is making good progress as far as offloading is concerned. I still believe he may need to talk to a surgeon about surgically correcting a hammertoe in order to avoid additional pressure to the site especially  since he Artie has diabetic shoes and they do not seem to have prevented callous buildup in ulcer formation. 03/30/17 on evaluation today patient appears to be doing better in regard to his right plantar foot ulcer. Continues to show signs of improvement which is good news. Unfortunately we were unable to get the appointment with the orthopedic, Dr. Doran Durand, whom I recommended for him due to patient having a balance at the practice. With that being said patient's foot ulcer does seem to be doing much better on evaluation today he still has some depth to the wound but overall we are seeing improvement and epithelialization week by week. Hopefully this is something that will close shortly. 04/05/17 on evaluation today patient unfortunately has what appears to be an area of the plantar surface of his ulcer where he had fluid collection where the callous grew over top of the wound bed. Unfortunately there was some pus like material noted during cleansing that we did sent for culture today. Hopefully this is not truly an infection is or does not appear to be any evidence of erythema surrounding and maybe this is just simply a small setback with a fluid collection that has caused this issue. Nonetheless we will see what that shows when we get the culture back. I am also going to have it completely antibiotic which I previously placed him on anyway which will hopefully prevent any true infection from setting in. 04/12/17 on evaluation today patient's ulcer on the plantar aspect of his right foot actually appears to be better then during last [name] evaluation. In general he has been tolerating the dressing changes in utilizing the offloading shoe which does seem to be beneficial for him. With that being said he still seems to get some pressure to the area just not nearly as much as he previously had noted. Again fortunately there is no significant pain. [name], [name] (  [id number]) 04/19/17 on evaluation today  patient appears to be doing excellent in regard to his right foot ulcer. He has been tolerating the dressing changes without complication. There really has not been any drainage over the past few days he tells me. With that being said he seems to be doing excellent in my opinion at this point he does have some callous buildup. 04/26/17 On evaluation today patient's wound appears to be completely healed which is great news. He has been tolerating the dressing changes without complication and I do think he has done very well in regard to the healing process and offloading he has listed everything that that we instructed him to do. Obviously this appears to have paid off and he has progressed very nicely. Readmission: 07/05/17 on evaluation today patient is seen for fault evaluation and our clinic concerning the same issue that I have previously treated him for ending back in April 2019. This is a callous region with subsequently an ulcer underneath this on the plantar aspect of his right second metatarsal region. Fortunately he's not having any significant discomfort although his girlfriend told him that she noted an area of bruising at the site underneath the callous and wanted him to come get this checked out. He was very please with our care previous and therefore was more than happy to come let us check this for him. Upon inspection initially he did have significant callous overlying the area in question. It was not easily identifiable as far as any ulcer was concerned. With that being said he with this significant callous did require some sharp debridement and following debridement there did appear to be a small ulcer underlying. Fortunately patient in general shows no signs of infection at this point. He again has neuropathy and therefore does not have any ongoing discomfort or pain at this point. 07/12/17 on evaluation today patient actually appears to be doing very well in regard to his plantar  foot ulcer. In fact this appears to be completely healed and there's no residual opening at this time. Overall I'm very pleased in this regard. Patient also is extremely pleased. Readmission: 02/08/18 patient presents today for follow-up evaluation he has previously been seen here in our office for the same issue. Fortunately there is not been any evidence of infection since I last saw him he does note that he is been going to see his local podiatrist for callous pairing. During that time they actually noted that he had an open wound and subsequently referred him back ties for evaluation of this wound. He has done very well in the past with good wound care often healing in just a few [name]. No fevers, chills, nausea, or vomiting noted at this time. 02/15/18 on evaluation today patient actually appears to be doing very well in regard to his plantar foot ulcer. He is been tolerating the dressing changes without complication. There does appear to be some eschar of the line the wound surface. Fortunately I am happy with how things are progressing from the standpoint of the wound progress from last week to this week. 02/22/18 on evaluation today patient appears to be doing very well in regard to his plantar foot ulcer. He did have callous buildup around the area that required some sharp debridement today. He tolerated the debridement without complication. Post debridement the wound bed appears to be doing much better there's just a very tiny opening still remaining. Electronic Signature(s) Signed: 02/22/2018 5:55:30 PM By: Worthy Keeler PA-C Entered  By: Worthy Keeler on 02/22/2018 08:51:56 Gienger, [name] ([id number]) -------------------------------------------------------------------------------- Physical Exam Details Patient Name: [name], AGUSTIN. Date of Service: 02/22/2018 8:15 AM Medical Record Number: [id number] Patient Account Number: [id number] Date of Birth/Sex: Feb 18, 1950 (67 y.o.  M) Treating RN: Montey Hora Primary Care Provider: Benita Stabile Other Clinician: Referring Provider: TATE, Sharlet [place] Treating Provider/Extender: [name], [name] [name] in Treatment: 2 Constitutional Well-nourished and well-hydrated in no acute distress. Respiratory normal breathing without difficulty. Psychiatric this patient is able to make decisions and demonstrates good insight into disease process. Alert and Oriented x 3. pleasant and cooperative. Notes Patient tolerated the debridement without any complication today and I was able to remove slough as well as callous down to good subcutaneous tissue he tolerated all this without complication. Electronic Signature(s) Signed: 02/22/2018 5:55:30 PM By: Worthy Keeler PA-C Entered By: Worthy Keeler on 02/22/2018 08:52:22 [name], [name] ([id number]) -------------------------------------------------------------------------------- Physician Orders Details Patient Name: [name], [name]. Date of Service: 02/22/2018 8:15 AM Medical Record Number: [id number] Patient Account Number: [id number] Date of Birth/Sex: 1950/12/03 (67 y.o. M) Treating RN: Montey Hora Primary Care Provider: Benita Stabile Other Clinician: Referring Provider: Benita Stabile Treating Provider/Extender: Melburn Hake, [name] [name] in Treatment: 2 Verbal / Phone Orders: No Diagnosis Coding ICD-10 Coding Code Description E11.621 Type 2 diabetes mellitus with foot ulcer L97.512 Non-pressure chronic ulcer of other part of right foot with fat layer exposed I10 Essential (primary) hypertension I48.20 Chronic atrial fibrillation, unspecified Wound Cleansing Wound #3 Right,Proximal,Plantar Foot o Clean wound with Normal Saline. Anesthetic (add to Medication List) Wound #3 Right,Proximal,Plantar Foot o Topical Lidocaine 4% cream applied to wound bed prior to debridement (In Clinic Only). Primary Wound Dressing Wound #3 Right,Proximal,Plantar Foot o Silver  Collagen Secondary Dressing Wound #3 [place] Dressing Change Frequency Wound #3 Right,Proximal,Plantar Foot o Change dressing every other day. Follow-up Appointments Wound #3 Right,Proximal,Plantar Foot o Return Appointment in 1 week. Off-Loading Wound #3 Right,Proximal,Plantar Foot o Open toe surgical shoe to: - right foot Electronic Signature(s) Signed: 02/22/2018 4:52:45 PM By: Montey Hora Signed: 02/22/2018 5:55:30 PM By: Worthy Keeler PA-C Entered By: Montey Hora on 02/22/2018 08:50:10 [name], [name] ([id number]) [name], [name] ([id number]) -------------------------------------------------------------------------------- Problem List Details Patient Name: [name], [name]. Date of Service: 02/22/2018 8:15 AM Medical Record Number: [id number] Patient Account Number: [id number] Date of Birth/Sex: [birth year] (67 y.o. M) Treating RN: Montey Hora Primary Care Provider: Benita Stabile Other Clinician: Referring Provider: Benita Stabile Treating Provider/Extender: Melburn Hake, [name] [name] in Treatment: 2 Active Problems ICD-10 Evaluated Encounter Code Description Active Date Today Diagnosis E11.621 Type 2 diabetes mellitus with foot ulcer 02/08/2018 No Yes L97.512 Non-pressure chronic ulcer of other part of right foot with fat 02/08/2018 No Yes layer exposed I10 Essential (primary) hypertension 02/08/2018 No Yes I48.20 Chronic atrial fibrillation, unspecified 02/08/2018 No Yes Inactive Problems Resolved Problems Electronic Signature(s) Signed: 02/22/2018 5:55:30 PM By: Worthy Keeler PA-C Entered By: Worthy Keeler on 02/22/2018 08:09:46 [name], [name] ([id number]) -------------------------------------------------------------------------------- Progress Note Details Patient Name: [name]. Date of Service: 02/22/2018 8:15 AM Medical Record Number: [id number] Patient Account Number: [id number] Date of Birth/Sex: 02/12/1950 (67 y.o.  M) Treating RN: Montey Hora Primary Care Provider: Benita Stabile Other Clinician: Referring Provider: Benita Stabile Treating Provider/Extender: Melburn Hake, [name] [name] in Treatment: 2 Subjective Chief Complaint Information obtained from Patient Right plantar foot ulcer History of Present Illness (HPI) The following HPI elements were documented for the patient's wound: Associated Signs and Symptoms:  Patient has a history of diabetes mellitus type II, hypertension, and atrial fibrillation. He also has a hammertoe deformity of the bilateral feet which seems to be causing pressure in the ball of his foot. 03/09/17 on evaluation today patient presents for his initial evaluation concerning an ulcer on the plantar aspect of his right foot which has been open he tells me for about three [name]. He has been seen at [facility] where they did perform debridement it appears according to a note on 02/22/17 where unfortunately it appears that his callous area began to break down into an ulcer after modifications have been added to his insulin. This had happened previously as well. However I do not have details of the severity of debridement at that point although it does not sound as if you the debridement was too significant based on what the patient is telling me that he still had a lot of callous following debridement. With that being said they were going to look into altering his insoles to try to prevent further breakdown in the future. Nonetheless he has had foul odor discharge coming from the ulcer which is noted all the way back to that visit on [facility]. Patient states that this was concerning him more than anything else. He does have diabetes although he described this as "borderline diabetes" he is on metoprolol however along with lisinopril. Patient is not having any issues with pain in regard to his right plantar foot. 03/16/17 on evaluation today patient appears to be doing  much better in regard to his plantar foot ulcer. He actually tells me that he loves the peg assist offloading shoe and that he hasn't had any pain in the ulcer area from the callous since I worked on it last week. Overall he is extremely happy with how things have progressed. Likewise the wound bed has no slough noted there is no evidence of infection and it looks excellent. I did receive the results of his hemoglobin A1c which showed a value of 5.9 which was elevated but actually rather well. Subsequently I did also receive the x-ray of his foot which showed diffuse degenerative change but no underlying acute bony abnormality. 03/23/17 on evaluation today patient appears to be doing better in regard to his right plantar foot ulcer. He continues to show signs of improvement there's definitely not as much drainage at this point. He has been tolerating the dressing changes without complication he is not happy with the peg assist offloading shoe but at the same time I do believe that it is making good progress as far as offloading is concerned. I still believe he may need to talk to a surgeon about surgically correcting a hammertoe in order to avoid additional pressure to the site especially since he Artie has diabetic shoes and they do not seem to have prevented callous buildup in ulcer formation. 03/30/17 on evaluation today patient appears to be doing better in regard to his right plantar foot ulcer. Continues to show signs of improvement which is good news. Unfortunately we were unable to get the appointment with the orthopedic, Dr. Doran Durand, whom I recommended for him due to patient having a balance at the practice. With that being said patient's foot ulcer does seem to be doing much better on evaluation today he still has some depth to the wound but overall we are seeing improvement and epithelialization week by week. Hopefully this is something that will close shortly. 04/05/17 on evaluation today  patient unfortunately has what appears to be an area of the plantar surface of his ulcer where he had fluid collection where the callous grew over top of the wound bed. Unfortunately there was some pus like material noted during cleansing that we did sent for culture today. Hopefully this is not truly an infection is or does not appear to be any DANIELLE, LENTO. ([id number]) evidence of erythema surrounding and maybe this is just simply a small setback with a fluid collection that has caused this issue. Nonetheless we will see what that shows when we get the culture back. I am also going to have it completely antibiotic which I previously placed him on anyway which will hopefully prevent any true infection from setting in. 04/12/17 on evaluation today patient's ulcer on the plantar aspect of his right foot actually appears to be better then during last [name] evaluation. In general he has been tolerating the dressing changes in utilizing the offloading shoe which does seem to be beneficial for him. With that being said he still seems to get some pressure to the area just not nearly as much as he previously had noted. Again fortunately there is no significant pain. 04/19/17 on evaluation today patient appears to be doing excellent in regard to his right foot ulcer. He has been tolerating the dressing changes without complication. There really has not been any drainage over the past few days he tells me. With that being said he seems to be doing excellent in my opinion at this point he does have some callous buildup. 04/26/17 On evaluation today patient's wound appears to be completely healed which is great news. He has been tolerating the dressing changes without complication and I do think he has done very well in regard to the healing process and offloading he has listed everything that that we instructed him to do. Obviously this appears to have paid off and he has progressed  very nicely. Readmission: 07/05/17 on evaluation today patient is seen for fault evaluation and our clinic concerning the same issue that I have previously treated him for ending back in April 2019. This is a callous region with subsequently an ulcer underneath this on the plantar aspect of his right second metatarsal region. Fortunately he's not having any significant discomfort although his girlfriend told him that she noted an area of bruising at the site underneath the callous and wanted him to come get this checked out. He was very please with our care previous and therefore was more than happy to come let us check this for him. Upon inspection initially he did have significant callous overlying the area in question. It was not easily identifiable as far as any ulcer was concerned. With that being said he with this significant callous did require some sharp debridement and following debridement there did appear to be a small ulcer underlying. Fortunately patient in general shows no signs of infection at this point. He again has neuropathy and therefore does not have any ongoing discomfort or pain at this point. 07/12/17 on evaluation today patient actually appears to be doing very well in regard to his plantar foot ulcer. In fact this appears to be completely healed and there's no residual opening at this time. Overall I'm very pleased in this regard. Patient also is extremely pleased. Readmission: 02/08/18 patient presents today for follow-up evaluation he has previously been seen here in our office for the same issue. Fortunately there is not been any evidence of infection since I  last saw him he does note that he is been going to see his local podiatrist for callous pairing. During that time they actually noted that he had an open wound and subsequently referred him back ties for evaluation of this wound. He has done very well in the past with good wound care often healing in just a  few [name]. No fevers, chills, nausea, or vomiting noted at this time. 02/15/18 on evaluation today patient actually appears to be doing very well in regard to his plantar foot ulcer. He is been tolerating the dressing changes without complication. There does appear to be some eschar of the line the wound surface. Fortunately I am happy with how things are progressing from the standpoint of the wound progress from last week to this week. 02/22/18 on evaluation today patient appears to be doing very well in regard to his plantar foot ulcer. He did have callous buildup around the area that required some sharp debridement today. He tolerated the debridement without complication. Post debridement the wound bed appears to be doing much better there's just a very tiny opening still remaining. Patient History Information obtained from Patient. Family History Cancer - Siblings, Diabetes - Father, Heart Disease - Mother, Hypertension - Mother, No family history of Hereditary Spherocytosis, Kidney Disease, Lung Disease, Seizures, Stroke, Thyroid Problems, Tuberculosis. PORFIRIO, BOLLIER ([id number]) Social History Former smoker, Marital Status - Single, Alcohol Use - Never, Drug Use - No History, Caffeine Use - Moderate. Medical History Eyes Denies history of Cataracts, Glaucoma, Optic Neuritis Ear/Nose/Mouth/Throat Denies history of Chronic sinus problems/congestion, Middle ear problems Hematologic/Lymphatic Denies history of Anemia, Hemophilia, Human Immunodeficiency Virus, Lymphedema, Sickle Cell Disease Respiratory Denies history of Aspiration, Asthma, Chronic Obstructive Pulmonary Disease (COPD), Pneumothorax, Sleep Apnea, Tuberculosis Cardiovascular Patient has history of Arrhythmia - a fib, Hypertension Denies history of Angina, Congestive Heart Failure, Coronary Artery Disease, Deep Vein Thrombosis, Hypotension, Myocardial Infarction, Peripheral Arterial Disease, Peripheral Venous Disease,  Phlebitis, Vasculitis Gastrointestinal Denies history of Cirrhosis , Colitis, Crohn s, Hepatitis A, Hepatitis B, Hepatitis C Endocrine Patient has history of Type II Diabetes Denies history of Type I Diabetes Genitourinary Denies history of End Stage Renal Disease Immunological Denies history of Lupus Erythematosus, Raynaud s, Scleroderma Integumentary (Skin) Denies history of History of Burn, History of pressure wounds Musculoskeletal Patient has history of Osteoarthritis Denies history of Gout, Rheumatoid Arthritis, Osteomyelitis Neurologic Denies history of Dementia, Neuropathy, Quadriplegia, Paraplegia, Seizure Disorder Oncologic Denies history of Received Chemotherapy, Received Radiation Medical And Surgical History Notes Cardiovascular High Cholesterol Review of Systems (ROS) Constitutional Symptoms (General Health) Denies complaints or symptoms of Fever, Chills. Respiratory The patient has no complaints or symptoms. Cardiovascular The patient has no complaints or symptoms. Psychiatric The patient has no complaints or symptoms. Objective AUTREY, HUMAN. ([id number]) Constitutional Well-nourished and well-hydrated in no acute distress. Vitals Time Taken: 8:24 AM, Height: 74 in, Weight: 240.9 lbs, BMI: 30.9, Temperature: 98.1 F, Pulse: 87 bpm, Respiratory Rate: 16 breaths/min, Blood Pressure: 143/53 mmHg. Respiratory normal breathing without difficulty. Psychiatric this patient is able to make decisions and demonstrates good insight into disease process. Alert and Oriented x 3. pleasant and cooperative. General Notes: Patient tolerated the debridement without any complication today and I was able to remove slough as well as callous down to good subcutaneous tissue he tolerated all this without complication. Integumentary (Hair, Skin) Wound #3 status is Open. Original cause of wound was Footwear Injury. The wound is located on the Right,Proximal,Plantar Foot. The  wound measures 0.1cm  length x 0.1cm width x 0.1cm depth; 0.008cm^2 area and 0.001cm^3 volume. There is Fat Layer (Subcutaneous Tissue) Exposed exposed. There is no tunneling or undermining noted. There is a none present amount of drainage noted. The wound margin is indistinct and nonvisible. There is no granulation within the wound bed. There is a medium (34-66%) amount of necrotic tissue within the wound bed including Eschar. The periwound skin appearance exhibited: Callus. The periwound skin appearance did not exhibit: Crepitus, Excoriation, Induration, Rash, Scarring, Dry/Scaly, Maceration, Atrophie Blanche, Cyanosis, Ecchymosis, Hemosiderin Staining, Mottled, Pallor, Rubor, Erythema. Periwound temperature was noted as No Abnormality. Assessment Active Problems ICD-10 Type 2 diabetes mellitus with foot ulcer Non-pressure chronic ulcer of other part of right foot with fat layer exposed Essential (primary) hypertension Chronic atrial fibrillation, unspecified Procedures Wound #3 Pre-procedure diagnosis of Wound #3 is a Diabetic Wound/Ulcer of the Lower Extremity located on the Right,Proximal,Plantar Foot .Severity of Tissue Pre Debridement is: Fat layer exposed. There was a Excisional Skin/Subcutaneous Tissue Debridement with a total area of 0.01 sq cm performed by [name], [name] E., PA-C. With the following instrument(s): Curette to remove Viable and Non-Viable tissue/material. Material removed includes Callus, Subcutaneous Tissue, Slough, and Other: adhered dressing after achieving pain control using Lidocaine 4% Topical Solution. No specimens were taken. A time out was conducted at 08:44, prior to the start of the procedure. A Minimum amount of bleeding was controlled with Pressure. The procedure was tolerated well with a pain level of 0 throughout and a pain level of 0 following the procedure. Post Debridement Measurements: 0.2cm length x 0.2cm width x 0.1cm depth; 0.003cm^3  volume. CASPER, PAGLIUCA ([id number]) Character of Wound/Ulcer Post Debridement is improved. Severity of Tissue Post Debridement is: Fat layer exposed. Post procedure Diagnosis Wound #3: Same as Pre-Procedure Plan Wound Cleansing: Wound #3 Right,Proximal,Plantar Foot: Clean wound with Normal Saline. Anesthetic (add to Medication List): Wound #3 Right,Proximal,Plantar Foot: Topical Lidocaine 4% cream applied to wound bed prior to debridement (In Clinic Only). Primary Wound Dressing: Wound #3 Right,Proximal,Plantar Foot: Silver Collagen Secondary Dressing: Wound #3 Right,Proximal,Plantar Foot: [place] Dressing Change Frequency: Wound #3 Right,Proximal,Plantar Foot: Change dressing every other day. Follow-up Appointments: Wound #3 Right,Proximal,Plantar Foot: Return Appointment in 1 week. Off-Loading: Wound #3 Right,Proximal,Plantar Foot: Open toe surgical shoe to: - right foot At this point my suggestion is going to be that we go ahead and continue with the above wound care measures for the next week. Patient is in agreement with plan. We will subsequently see were things stand at follow-up. If anything changes or worsens in the meantime he will contact the office and let me know. Please see above for specific wound care orders. We will see patient for re-evaluation in 1 week(s) here in the clinic. If anything worsens or changes patient will contact our office for additional recommendations. Electronic Signature(s) Signed: 02/22/2018 5:55:30 PM By: Worthy Keeler PA-C Entered By: Worthy Keeler on 02/22/2018 08:52:36 Hoxworth, [name] ([id number]) -------------------------------------------------------------------------------- ROS/PFSH Details Patient Name: MERTON, WADLOW Date of Service: 02/22/2018 8:15 AM Medical Record Number: [id number] Patient Account Number: [id number] Date of Birth/Sex: [birth year] (67 y.o. M) Treating RN: Montey Hora Primary Care Provider: Benita Stabile Other Clinician: Referring Provider: Benita Stabile Treating Provider/Extender: Melburn Hake, [name] [name] in Treatment: 2 Information Obtained From Patient Wound History Do you currently have one or more open woundso Yes How many open wounds do you currently haveo 1 Approximately how long have you had your woundso 2 [name] How have  you been treating your wound(s) until nowo silvercell Has your wound(s) ever healed and then re-openedo Yes Have you had any lab work done in the past montho Yes Who ordered the lab work doneo Benita Stabile PCP Have you tested positive for an antibiotic resistant organism (MRSA, VRE)o No Have you tested positive for osteomyelitis (bone infection)o No Have you had any tests for circulation on your legso Yes Constitutional Symptoms (General Health) Complaints and Symptoms: Negative for: Fever; Chills Eyes Medical History: Negative for: Cataracts; Glaucoma; Optic Neuritis Ear/Nose/Mouth/Throat Medical History: Negative for: Chronic sinus problems/congestion; Middle ear problems Hematologic/Lymphatic Medical History: Negative for: Anemia; Hemophilia; Human Immunodeficiency Virus; Lymphedema; Sickle Cell Disease Respiratory Complaints and Symptoms: No Complaints or Symptoms Medical History: Negative for: Aspiration; Asthma; Chronic Obstructive Pulmonary Disease (COPD); Pneumothorax; Sleep Apnea; Tuberculosis Cardiovascular Complaints and Symptoms: No Complaints or Symptoms Medical History: JACSON, RAPAPORT ([id number]) Positive for: Arrhythmia - a fib; Hypertension Negative for: Angina; Congestive Heart Failure; Coronary Artery Disease; Deep Vein Thrombosis; Hypotension; Myocardial Infarction; Peripheral Arterial Disease; Peripheral Venous Disease; Phlebitis; Vasculitis Past Medical History Notes: High Cholesterol Gastrointestinal Medical History: Negative for: Cirrhosis ; Colitis; Crohnos; Hepatitis A; Hepatitis B; Hepatitis C Endocrine Medical  History: Positive for: Type II Diabetes Negative for: Type I Diabetes Time with diabetes: 3 years Treated with: Oral agents Blood sugar tested every day: No Genitourinary Medical History: Negative for: End Stage Renal Disease Immunological Medical History: Negative for: Lupus Erythematosus; Raynaudos; Scleroderma Integumentary (Skin) Medical History: Negative for: History of Burn; History of pressure wounds Musculoskeletal Medical History: Positive for: Osteoarthritis Negative for: Gout; Rheumatoid Arthritis; Osteomyelitis Neurologic Medical History: Negative for: Dementia; Neuropathy; Quadriplegia; Paraplegia; Seizure Disorder Oncologic Medical History: Negative for: Received Chemotherapy; Received Radiation Psychiatric Complaints and Symptoms: No Complaints or Symptoms Immunizations Pneumococcal Vaccine: ELIZJAH, NOBLET ([id number]) Received Pneumococcal Vaccination: Yes Immunization Notes: up to date Implantable Devices Family and Social History Cancer: Yes - Siblings; Diabetes: Yes - Father; Heart Disease: Yes - Mother; Hereditary Spherocytosis: No; Hypertension: Yes - Mother; Kidney Disease: No; Lung Disease: No; Seizures: No; Stroke: No; Thyroid Problems: No; Tuberculosis: No; Former smoker; Marital Status - Single; Alcohol Use: Never; Drug Use: No History; Caffeine Use: Moderate; Financial Concerns: No; Food, Clothing or Shelter Needs: No; Support System Lacking: No; Transportation Concerns: No; Advanced Directives: No; Patient does not want information on Advanced Directives; Do not resuscitate: No; Living Will: No; Medical Power of Attorney: No Physician Affirmation I have reviewed and agree with the above information. Electronic Signature(s) Signed: 02/22/2018 4:52:45 PM By: Montey Hora Signed: 02/22/2018 5:55:30 PM By: Worthy Keeler PA-C Entered By: Worthy Keeler on 02/22/2018 08:52:12 Suthers, [name]  ([id number]) -------------------------------------------------------------------------------- SuperBill Details Patient Name: AARIT, KASHUBA. Date of Service: 02/22/2018 Medical Record Number: [id number] Patient Account Number: [id number] Date of Birth/Sex: [birth year] (68 y.o. M) Treating RN: Montey Hora Primary Care Provider: Benita Stabile Other Clinician: Referring Provider: Benita Stabile Treating Provider/Extender: Melburn Hake, [name] [name] in Treatment: 2 Diagnosis Coding ICD-10 Codes Code Description E11.621 Type 2 diabetes mellitus with foot ulcer L97.512 Non-pressure chronic ulcer of other part of right foot with fat layer exposed I10 Essential (primary) hypertension I48.20 Chronic atrial fibrillation, unspecified Facility Procedures CPT4 Code: [id number] Description: 11042 - DEB SUBQ TISSUE 20 SQ CM/< ICD-10 Diagnosis Description L97.512 Non-pressure chronic ulcer of other part of right foot with fa Modifier: t layer exposed Quantity: 1 Physician Procedures CPT4 Code: 8338250 Description: 11042 - WC PHYS SUBQ TISS 20 SQ CM ICD-10 Diagnosis Description L97.512 Non-pressure  chronic ulcer of other part of right foot with fa Modifier: t layer exposed Quantity: 1 Electronic Signature(s) Signed: 02/22/2018 5:55:30 PM By: Worthy Keeler PA-C Entered By: Worthy Keeler on 02/22/2018 08:52:43

## 2018-02-25 NOTE — Progress Notes (Signed)
LAKEN, LOBATO ([id number]) Visit Report for 02/22/2018 Arrival Information Details Patient Name: [name], [name]. Date of Service: 02/22/2018 8:15 AM Medical Record Number: [id number] Patient Account Number: [id number] Date of Birth/Sex: [birth year] (67 y.o. M) Treating RN: Montey Hora Primary Care Leone Mobley: Benita Stabile Other Clinician: Referring Neema Fluegge: Benita Stabile Treating Teiana Hajduk/Extender: Melburn Hake, HOYT Weeks in Treatment: 2 Visit Information History Since Last Visit Added or deleted any medications: No Patient Arrived: Ambulatory Any new allergies or adverse reactions: No Arrival Time: 08:24 Had a fall or experienced change in No Accompanied By: self activities of daily living that may affect Transfer Assistance: None risk of falls: Secondary Verification Process Yes Signs or symptoms of abuse/neglect since last visito No Completed: Hospitalized since last visit: No Patient Has Alerts: Yes Implantable device outside of the clinic excluding No Patient Alerts: Patient on Blood cellular tissue based products placed in the center Thinner since last visit: 03/02/17 Has Dressing in Place as Prescribed: Yes ABI (L) 1.14 (R) 1.20 81MG  aspirin Pain Present Now: No Electronic Signature(s) Signed: 02/22/2018 4:14:38 PM By: Lorine Bears RCP, RRT, CHT Entered By: Lorine Bears on 02/22/2018 08:24:32 Lacey, [name] ([id number]) -------------------------------------------------------------------------------- Encounter Discharge Information Details Patient Name: SOTERO, BRINKMEYER. Date of Service: 02/22/2018 8:15 AM Medical Record Number: [id number] Patient Account Number: [id number] Date of Birth/Sex: 1950-09-03 (67 y.o. M) Treating RN: Montey Hora Primary Care Euriah Matlack: Benita Stabile Other Clinician: Referring Javien Tesch: TATE, Sharlet Salina Treating Meng Winterton/Extender: Melburn Hake, HOYT Weeks in Treatment: 2 Encounter Discharge Information Items Post Procedure  Vitals Discharge Condition: Stable Temperature (F): 98.1 Ambulatory Status: Ambulatory Pulse (bpm): 87 Discharge Destination: Home Respiratory Rate (breaths/min): 16 Transportation: Private Auto Blood Pressure (mmHg): 143/53 Accompanied By: self Schedule Follow-up Appointment: Yes Clinical Summary of Care: Electronic Signature(s) Signed: 02/22/2018 4:52:45 PM By: Montey Hora Entered By: Montey Hora on 02/22/2018 08:50:48 Edgren, [name] ([id number]) -------------------------------------------------------------------------------- Lower Extremity Assessment Details Patient Name: [name] Date of Service: 02/22/2018 8:15 AM Medical Record Number: [id number] Patient Account Number: [id number] Date of Birth/Sex: [birth year] (67 y.o. M) Treating RN: Harold Barban Primary Care Suhayb Anzalone: Benita Stabile Other Clinician: Referring Sammye Staff: Benita Stabile Treating Anastashia Westerfeld/Extender: Melburn Hake, HOYT Weeks in Treatment: 2 Electronic Signature(s) Signed: 02/24/2018 11:43:02 AM By: Harold Barban Entered By: Harold Barban on 02/22/2018 08:34:13 Mehlman, [name] ([id number]) -------------------------------------------------------------------------------- Multi Wound Chart Details Patient Name: [name] Date of Service: 02/22/2018 8:15 AM Medical Record Number: [id number] Patient Account Number: [id number] Date of Birth/Sex: September 13, 1950 (67 y.o. M) Treating RN: Montey Hora Primary Care Jahson Emanuele: Benita Stabile Other Clinician: Referring Iya Hamed: Benita Stabile Treating Alegria Dominique/Extender: STONE III, HOYT Weeks in Treatment: 2 Vital Signs Height(in): 74 Pulse(bpm): 87 Weight(lbs): 240.9 Blood Pressure(mmHg): 143/53 Body Mass Index(BMI): 31 Temperature(F): 98.1 Respiratory Rate 16 (breaths/min): Photos: [3:No Photos] [N/A:N/A] Wound Location: [3:Right Foot - Plantar, Proximal] [N/A:N/A] Wounding Event: [3:Footwear Injury] [N/A:N/A] Primary Etiology: [3:Diabetic Wound/Ulcer  of the Lower Extremity] [N/A:N/A] Comorbid History: [3:Arrhythmia, Hypertension, Type II Diabetes, Osteoarthritis] [N/A:N/A] Date Acquired: [3:01/25/2018] [N/A:N/A] Weeks of Treatment: [3:2] [N/A:N/A] Wound Status: [3:Open] [N/A:N/A] Measurements L x W x D [3:0.1x0.1x0.1] [N/A:N/A] (cm) Area (cm) : [3:0.008] [N/A:N/A] Volume (cm) : [3:0.001] [N/A:N/A] % Reduction in Area: [3:93.70%] [N/A:N/A] % Reduction in Volume: [3:99.10%] [N/A:N/A] Classification: [3:Grade 2] [N/A:N/A] Exudate Amount: [3:None Present] [N/A:N/A] Wound Margin: [3:Indistinct, nonvisible] [N/A:N/A] Granulation Amount: [3:None Present (0%)] [N/A:N/A] Necrotic Amount: [3:Medium (34-66%)] [N/A:N/A] Necrotic Tissue: [3:Eschar] [N/A:N/A] Exposed Structures: [3:Fat Layer (Subcutaneous Tissue) Exposed: Yes Fascia: No Tendon: No Muscle: No Joint: No Bone: No] [  N/A:N/A] Epithelialization: [3:None] [N/A:N/A] Periwound Skin Texture: [3:Callus: Yes Excoriation: No Induration: No Crepitus: No Rash: No Scarring: No] [N/A:N/A] Periwound Skin Moisture: Maceration: No N/A N/A Dry/Scaly: No Periwound Skin Color: Atrophie Blanche: No N/A N/A Cyanosis: No Ecchymosis: No Erythema: No Hemosiderin Staining: No Mottled: No Pallor: No Rubor: No Temperature: No Abnormality N/A N/A Tenderness on Palpation: No N/A N/A Wound Preparation: Ulcer Cleansing: N/A N/A Rinsed/Irrigated with Saline Topical Anesthetic Applied: Other: lidocaine 4% Treatment Notes Electronic Signature(s) Signed: 02/22/2018 4:52:45 PM By: Montey Hora Entered By: Montey Hora on 02/22/2018 08:44:56 Larimer, [name] ([id number]) -------------------------------------------------------------------------------- Multi-Disciplinary Care Plan Details Patient Name: KAMARION, ZAGAMI. Date of Service: 02/22/2018 8:15 AM Medical Record Number: [id number] Patient Account Number: [id number] Date of Birth/Sex: 1950-09-16 (67 y.o. M) Treating RN: Montey Hora Primary  Care Clydean Posas: Benita Stabile Other Clinician: Referring Shane Badeaux: Benita Stabile Treating Koehn Salehi/Extender: Melburn Hake, HOYT Weeks in Treatment: 2 Active Inactive Abuse / Safety / Falls / Self Care Management Nursing Diagnoses: Potential for falls Goals: Patient will not experience any injury related to falls Date Initiated: 02/08/2018 Target Resolution Date: 04/23/2018 Goal Status: Active Interventions: Assess fall risk on admission and as needed Notes: Orientation to the Wound Care Program Nursing Diagnoses: Knowledge deficit related to the wound healing center program Goals: Patient/caregiver will verbalize understanding of the [place] Program Date Initiated: 02/08/2018 Target Resolution Date: 04/23/2018 Goal Status: Active Interventions: Provide education on orientation to the wound center Notes: Wound/Skin Impairment Nursing Diagnoses: Impaired tissue integrity Goals: Ulcer/skin breakdown will heal within 14 weeks Date Initiated: 02/08/2018 Target Resolution Date: 04/23/2018 Goal Status: Active Interventions: Assess patient/caregiver ability to obtain necessary supplies ANJELO, PULLMAN ([id number]) Assess patient/caregiver ability to perform ulcer/skin care regimen upon admission and as needed Assess ulceration(s) every visit Notes: Electronic Signature(s) Signed: 02/22/2018 4:52:45 PM By: Montey Hora Entered By: Montey Hora on 02/22/2018 08:44:50 Novello, [name] ([id number]) -------------------------------------------------------------------------------- Pain Assessment Details Patient Name: [name] Date of Service: 02/22/2018 8:15 AM Medical Record Number: [id number] Patient Account Number: [id number] Date of Birth/Sex: October 09, 1950 (67 y.o. M) Treating RN: Montey Hora Primary Care Tylee Yum: Benita Stabile Other Clinician: Referring Drevon Plog: Benita Stabile Treating Marely Apgar/Extender: Melburn Hake, HOYT Weeks in Treatment: 2 Active Problems Location of  Pain Severity and Description of Pain Patient Has Paino No Site Locations Pain Management and Medication Current Pain Management: Electronic Signature(s) Signed: 02/22/2018 4:14:38 PM By: Paulla Fore, RRT, CHT Signed: 02/22/2018 4:52:45 PM By: Montey Hora Entered By: Lorine Bears on 02/22/2018 08:24:45 Vanderwoude, [name] ([id number]) -------------------------------------------------------------------------------- Patient/Caregiver Education Details Patient Name: VEDANTH, SIRICO. Date of Service: 02/22/2018 8:15 AM Medical Record Number: [id number] Patient Account Number: [id number] Date of Birth/Gender: [birth year] (68 y.o. M) Treating RN: Montey Hora Primary Care Physician: Benita Stabile Other Clinician: Referring Physician: Benita Stabile Treating Physician/Extender: Sharalyn Ink in Treatment: 2 Education Assessment Education Provided To: Patient Education Topics Provided Wound/Skin Impairment: Handouts: Other: wound care as ordered Methods: Demonstration, Explain/Verbal Responses: State content correctly Electronic Signature(s) Signed: 02/22/2018 4:52:45 PM By: Montey Hora Entered By: Montey Hora on 02/22/2018 [place], [name] ([id number]) -------------------------------------------------------------------------------- Wound Assessment Details Patient Name: [name] Date of Service: 02/22/2018 8:15 AM Medical Record Number: [id number] Patient Account Number: [id number] Date of Birth/Sex: 1950/03/13 (67 y.o. M) Treating RN: Harold Barban Primary Care Briar Sword: Benita Stabile Other Clinician: Referring Welcome Fults: Benita Stabile Treating Barnes Florek/Extender: STONE III, HOYT Weeks in Treatment: 2 Wound Status Wound Number: 3 Primary Diabetic Wound/Ulcer of the Lower Extremity Etiology:  Wound Location: Right Foot - Plantar, Proximal Wound Status: Open Wounding Event: Footwear Injury Comorbid Arrhythmia, Hypertension, Type II  Diabetes, Date Acquired: 01/25/2018 History: Osteoarthritis Weeks Of Treatment: 2 Clustered Wound: No Photos Photo Uploaded By: Harold Barban on 02/22/2018 12:20:18 Wound Measurements Length: (cm) 0.1 Width: (cm) 0.1 Depth: (cm) 0.1 Area: (cm) 0.008 Volume: (cm) 0.001 % Reduction in Area: 93.7% % Reduction in Volume: 99.1% Epithelialization: None Tunneling: No Undermining: No Wound Description Classification: Grade 2 Wound Margin: Indistinct, nonvisible Exudate Amount: None Present Foul Odor After Cleansing: No Slough/Fibrino No Wound Bed Granulation Amount: None Present (0%) Exposed Structure Necrotic Amount: Medium (34-66%) Fascia Exposed: No Necrotic Quality: Eschar Fat Layer (Subcutaneous Tissue) Exposed: Yes Tendon Exposed: No Muscle Exposed: No Joint Exposed: No Bone Exposed: No Periwound Skin Texture Texture Color No Abnormalities Noted: No No Abnormalities Noted: No Sherrin, Giovani J. ([id number]) Callus: Yes Atrophie Blanche: No Crepitus: No Cyanosis: No Excoriation: No Ecchymosis: No Induration: No Erythema: No Rash: No Hemosiderin Staining: No Scarring: No Mottled: No Pallor: No Moisture Rubor: No No Abnormalities Noted: No Dry / Scaly: No Temperature / Pain Maceration: No Temperature: No Abnormality Wound Preparation Ulcer Cleansing: Rinsed/Irrigated with Saline Topical Anesthetic Applied: Other: lidocaine 4%, Treatment Notes Wound #3 (Right, Proximal, Plantar Foot) Notes prisma, [place]; patient has offloading shoe Electronic Signature(s) Signed: 02/24/2018 11:43:02 AM By: Harold Barban Entered By: Harold Barban on 02/22/2018 08:32:58 Miner, [name] ([id number]) -------------------------------------------------------------------------------- Vitals Details Patient Name: [name] Date of Service: 02/22/2018 8:15 AM Medical Record Number: [id number] Patient Account Number: [id number] Date of Birth/Sex: 10-21-1950 (67  y.o. M) Treating RN: Montey Hora Primary Care Channie Bostick: Benita Stabile Other Clinician: Referring Tommi Crepeau: Benita Stabile Treating Marley Pakula/Extender: STONE III, HOYT Weeks in Treatment: 2 Vital Signs Time Taken: 08:24 Temperature (F): 98.1 Height (in): 74 Pulse (bpm): 87 Weight (lbs): 240.9 Respiratory Rate (breaths/min): 16 Body Mass Index (BMI): 30.9 Blood Pressure (mmHg): 143/53 Reference Range: 80 - 120 mg / dl Airway Electronic Signature(s) Signed: 02/22/2018 4:14:38 PM By: Lorine Bears RCP, RRT, CHT Entered By: Becky Sax, Amado Nash on 02/22/2018 08:27:47

## 2018-03-01 ENCOUNTER — Encounter: Payer: PPO | Admitting: Physician Assistant

## 2018-03-01 DIAGNOSIS — L97512 Non-pressure chronic ulcer of other part of right foot with fat layer exposed: Secondary | ICD-10-CM | POA: Diagnosis not present

## 2018-03-01 DIAGNOSIS — Z09 Encounter for follow-up examination after completed treatment for conditions other than malignant neoplasm: Secondary | ICD-10-CM | POA: Diagnosis not present

## 2018-03-01 DIAGNOSIS — E11621 Type 2 diabetes mellitus with foot ulcer: Secondary | ICD-10-CM | POA: Diagnosis not present

## 2018-03-06 NOTE — Progress Notes (Signed)
CLESTER, CHLEBOWSKI ([id number]) Visit Report for 03/01/2018 Chief Complaint Document Details Patient Name: [name], [name]. Date of Service: 03/01/2018 8:30 AM Medical Record Number: [id number] Patient Account Number: [id number] Date of Birth/Sex: 1950/03/21 (67 y.o. M) Treating RN: Montey Hora Primary Care Provider: Benita Stabile Other Clinician: Referring Provider: Benita Stabile Treating Provider/Extender: Melburn Hake, HOYT Weeks in Treatment: 3 Information Obtained from: Patient Chief Complaint Right plantar foot ulcer Electronic Signature(s) Signed: 03/06/2018 7:15:48 AM By: Worthy Keeler PA-C Entered By: Worthy Keeler on 03/01/2018 08:25:34 Rippeon, [name] ([id number]) -------------------------------------------------------------------------------- Debridement Details Patient Name: [name]. Date of Service: 03/01/2018 8:30 AM Medical Record Number: [id number] Patient Account Number: [id number] Date of Birth/Sex: Oct 29, 1950 (67 y.o. M) Treating RN: Montey Hora Primary Care Provider: Benita Stabile Other Clinician: Referring Provider: Benita Stabile Treating Provider/Extender: Melburn Hake, HOYT Weeks in Treatment: 3 Debridement Performed for Wound #3 Right,Proximal,Plantar Foot Assessment: Performed By: Physician STONE III, HOYT E., PA-C Debridement Type: Debridement Severity of Tissue Pre Fat layer exposed Debridement: Level of Consciousness (Pre- Awake and Alert procedure): Pre-procedure Verification/Time Yes - 08:57 Out Taken: Start Time: 08:57 Pain Control: Lidocaine 4% Topical Solution Total Area Debrided (L x W): 0.1 (cm) x 0.1 (cm) = 0.01 (cm) Tissue and other material Viable, Non-Viable, Callus, Slough, Subcutaneous, Slough debrided: Level: Skin/Subcutaneous Tissue Debridement Description: Excisional Instrument: Curette Bleeding: Minimum Hemostasis Achieved: Pressure End Time: 09:04 Procedural Pain: 0 Post Procedural Pain: 0 Response to Treatment:  Procedure was tolerated well Level of Consciousness Awake and Alert (Post-procedure): Post Debridement Measurements of Total Wound Length: (cm) 0.1 Width: (cm) 0.1 Depth: (cm) 0.2 Volume: (cm) 0.002 Character of Wound/Ulcer Post Debridement: Improved Severity of Tissue Post Debridement: Fat layer exposed Post Procedure Diagnosis Same as Pre-procedure Electronic Signature(s) Signed: 03/01/2018 4:52:44 PM By: Montey Hora Signed: 03/06/2018 7:15:48 AM By: Worthy Keeler PA-C Entered By: Montey Hora on 03/01/2018 09:04:36 Galindo, [name] ([id number]) -------------------------------------------------------------------------------- HPI Details Patient Name: [name]. Date of Service: 03/01/2018 8:30 AM Medical Record Number: [id number] Patient Account Number: [id number] Date of Birth/Sex: 13-Aug-1950 (67 y.o. M) Treating RN: Montey Hora Primary Care Provider: Benita Stabile Other Clinician: Referring Provider: Benita Stabile Treating Provider/Extender: Melburn Hake, HOYT Weeks in Treatment: 3 History of Present Illness Associated Signs and Symptoms: Patient has a history of diabetes mellitus type II, hypertension, and atrial fibrillation. He also has a hammertoe deformity of the bilateral feet which seems to be causing pressure in the ball of his foot. HPI Description: 03/09/17 on evaluation today patient presents for his initial evaluation concerning an ulcer on the plantar aspect of his right foot which has been open he tells me for about three weeks. He has been seen at [facility] where they did perform debridement it appears according to a note on 02/22/17 where unfortunately it appears that his callous area began to break down into an ulcer after modifications have been added to his insulin. This had happened previously as well. However I do not have details of the severity of debridement at that point although it does not sound as if you the debridement was too  significant based on what the patient is telling me that he still had a lot of callous following debridement. With that being said they were going to look into altering his insoles to try to prevent further breakdown in the future. Nonetheless he has had foul odor discharge coming from the ulcer which is noted all the way back to that visit on 02/22/17  [facility]. Patient states that this was concerning him more than anything else. He does have diabetes although he described this as "borderline diabetes" he is on metoprolol however along with lisinopril. Patient is not having any issues with pain in regard to his right plantar foot. 03/16/17 on evaluation today patient appears to be doing much better in regard to his plantar foot ulcer. He actually tells me that he loves the peg assist offloading shoe and that he hasn't had any pain in the ulcer area from the callous since I worked on it last week. Overall he is extremely happy with how things have progressed. Likewise the wound bed has no slough noted there is no evidence of infection and it looks excellent. I did receive the results of his hemoglobin A1c which showed a value of 5.9 which was elevated but actually rather well. Subsequently I did also receive the x-ray of his foot which showed diffuse degenerative change but no underlying acute bony abnormality. 03/23/17 on evaluation today patient appears to be doing better in regard to his right plantar foot ulcer. He continues to show signs of improvement there's definitely not as much drainage at this point. He has been tolerating the dressing changes without complication he is not happy with the peg assist offloading shoe but at the same time I do believe that it is making good progress as far as offloading is concerned. I still believe he may need to talk to a surgeon about surgically correcting a hammertoe in order to avoid additional pressure to the site especially since he Artie has  diabetic shoes and they do not seem to have prevented callous buildup in ulcer formation. 03/30/17 on evaluation today patient appears to be doing better in regard to his right plantar foot ulcer. Continues to show signs of improvement which is good news. Unfortunately we were unable to get the appointment with the orthopedic, Dr. Doran Durand, whom I recommended for him due to patient having a balance at the practice. With that being said patient's foot ulcer does seem to be doing much better on evaluation today he still has some depth to the wound but overall we are seeing improvement and epithelialization week by week. Hopefully this is something that will close shortly. 04/05/17 on evaluation today patient unfortunately has what appears to be an area of the plantar surface of his ulcer where he had fluid collection where the callous grew over top of the wound bed. Unfortunately there was some pus like material noted during cleansing that we did sent for culture today. Hopefully this is not truly an infection is or does not appear to be any evidence of erythema surrounding and maybe this is just simply a small setback with a fluid collection that has caused this issue. Nonetheless we will see what that shows when we get the culture back. I am also going to have it completely antibiotic which I previously placed him on anyway which will hopefully prevent any true infection from setting in. 04/12/17 on evaluation today patient's ulcer on the plantar aspect of his right foot actually appears to be better then during last weeks evaluation. In general he has been tolerating the dressing changes in utilizing the offloading shoe which does seem to be beneficial for him. With that being said he still seems to get some pressure to the area just not nearly as much as he previously had noted. Again fortunately there is no significant pain. JERROD, DAMIANO ([id number]) 04/19/17 on  evaluation today patient appears to  be doing excellent in regard to his right foot ulcer. He has been tolerating the dressing changes without complication. There really has not been any drainage over the past few days he tells me. With that being said he seems to be doing excellent in my opinion at this point he does have some callous buildup. 04/26/17 On evaluation today patient's wound appears to be completely healed which is great news. He has been tolerating the dressing changes without complication and I do think he has done very well in regard to the healing process and offloading he has listed everything that that we instructed him to do. Obviously this appears to have paid off and he has progressed very nicely. Readmission: 07/05/17 on evaluation today patient is seen for fault evaluation and our clinic concerning the same issue that I have previously treated him for ending back in April 2019. This is a callous region with subsequently an ulcer underneath this on the plantar aspect of his right second metatarsal region. Fortunately he's not having any significant discomfort although his girlfriend told him that she noted an area of bruising at the site underneath the callous and wanted him to come get this checked out. He was very please with our care previous and therefore was more than happy to come let us check this for him. Upon inspection initially he did have significant callous overlying the area in question. It was not easily identifiable as far as any ulcer was concerned. With that being said he with this significant callous did require some sharp debridement and following debridement there did appear to be a small ulcer underlying. Fortunately patient in general shows no signs of infection at this point. He again has neuropathy and therefore does not have any ongoing discomfort or pain at this point. 07/12/17 on evaluation today patient actually appears to be doing very well in regard to his plantar foot ulcer. In fact  this appears to be completely healed and there's no residual opening at this time. Overall I'm very pleased in this regard. Patient also is extremely pleased. Readmission: 02/08/18 patient presents today for follow-up evaluation he has previously been seen here in our office for the same issue. Fortunately there is not been any evidence of infection since I last saw him he does note that he is been going to see his local podiatrist for callous pairing. During that time they actually noted that he had an open wound and subsequently referred him back ties for evaluation of this wound. He has done very well in the past with good wound care often healing in just a few weeks. No fevers, chills, nausea, or vomiting noted at this time. 02/15/18 on evaluation today patient actually appears to be doing very well in regard to his plantar foot ulcer. He is been tolerating the dressing changes without complication. There does appear to be some eschar of the line the wound surface. Fortunately I am happy with how things are progressing from the standpoint of the wound progress from last week to this week. 02/22/18 on evaluation today patient appears to be doing very well in regard to his plantar foot ulcer. He did have callous buildup around the area that required some sharp debridement today. He tolerated the debridement without complication. Post debridement the wound bed appears to be doing much better there's just a very tiny opening still remaining. 03/01/18 on evaluation today patient appears to be doing excellent in regard to his plantar  foot ulcer. In fact after I debrided away what appeared to be a dried blood clot on the surface and actually appear there was just a very small opening remaining in the central portion of the wound. This is literally a pinpoint region. Overall he has done well and I'm very pleased in this regard. I think that likely one more week will see this area completely  healed. Electronic Signature(s) Signed: 03/06/2018 7:15:48 AM By: Worthy Keeler PA-C Entered By: Worthy Keeler on 03/01/2018 09:10:28 Glas, [name] ([id number]) -------------------------------------------------------------------------------- Physical Exam Details Patient Name: KEON, PENDER. Date of Service: 03/01/2018 8:30 AM Medical Record Number: [id number] Patient Account Number: [id number] Date of Birth/Sex: November 03, 1950 (67 y.o. M) Treating RN: Montey Hora Primary Care Provider: Benita Stabile Other Clinician: Referring Provider: TATE, Sharlet Sal[place] Treating Provider/Extender: STONE III, HOYT Weeks in Treatment: 3 Constitutional Well-nourished and well-hydrated in no acute distress. Respiratory normal breathing without difficulty. Psychiatric this patient is able to make decisions and demonstrates good insight into disease process. Alert and Oriented x 3. pleasant and cooperative. Notes Patient's wound bed currently shows evidence of good granulation at this time which is excellent news. He has good epithelialization as well and want to remove the callous and dried blood from the surface of the wound it appears this is indeed completely healed all except for a small pinpoint region in the central aspect of the wound. This is obviously good news. Electronic Signature(s) Signed: 03/06/2018 7:15:48 AM By: Worthy Keeler PA-C Entered By: Worthy Keeler on 03/01/2018 09:10:58 Ashkar, [name] ([id number]) -------------------------------------------------------------------------------- Physician Orders Details Patient Name: RILEN, SHUKLA. Date of Service: 03/01/2018 8:30 AM Medical Record Number: [id number] Patient Account Number: [id number] Date of Birth/Sex: November 18, 1950 (67 y.o. M) Treating RN: Montey Hora Primary Care Provider: Benita Stabile Other Clinician: Referring Provider: Benita Stabile Treating Provider/Extender: Melburn Hake, HOYT Weeks in Treatment: 3 Verbal / Phone Orders:  No Diagnosis Coding ICD-10 Coding Code Description E11.621 Type 2 diabetes mellitus with foot ulcer L97.512 Non-pressure chronic ulcer of other part of right foot with fat layer exposed I10 Essential (primary) hypertension I48.20 Chronic atrial fibrillation, unspecified Wound Cleansing Wound #3 Right,Proximal,Plantar Foot o Clean wound with Normal Saline. Anesthetic (add to Medication List) Wound #3 Right,Proximal,Plantar Foot o Topical Lidocaine 4% cream applied to wound bed prior to debridement (In Clinic Only). Primary Wound Dressing Wound #3 Right,Proximal,Plantar Foot o Xeroform Secondary Dressing Wound #3 Freeborn Dressing Change Frequency Wound #3 Right,Proximal,Plantar Foot o Change dressing every other day. Follow-up Appointments Wound #3 Right,Proximal,Plantar Foot o Return Appointment in 1 week. Off-Loading Wound #3 Right,Proximal,Plantar Foot o Open toe surgical shoe to: - right foot Electronic Signature(s) Signed: 03/01/2018 4:52:44 PM By: Montey Hora Signed: 03/06/2018 7:15:48 AM By: Worthy Keeler PA-C Entered By: Montey Hora on 03/01/2018 09:05:05 Ran, OLANREWAJU OSBORN ([id number]) ISSAI, WERLING ([id number]) -------------------------------------------------------------------------------- Problem List Details Patient Name: SHAKA, CARDIN. Date of Service: 03/01/2018 8:30 AM Medical Record Number: [id number] Patient Account Number: [id number] Date of Birth/Sex: 1950-07-26 (67 y.o. M) Treating RN: Montey Hora Primary Care Provider: Benita Stabile Other Clinician: Referring Provider: Benita Stabile Treating Provider/Extender: Melburn Hake, HOYT Weeks in Treatment: 3 Active Problems ICD-10 Evaluated Encounter Code Description Active Date Today Diagnosis E11.621 Type 2 diabetes mellitus with foot ulcer 02/08/2018 No Yes L97.512 Non-pressure chronic ulcer of other part of right foot with fat 02/08/2018 No Yes layer  exposed I10 Essential (primary) hypertension 02/08/2018 No Yes I48.20 Chronic atrial fibrillation, unspecified 02/08/2018 No  Yes Inactive Problems Resolved Problems Electronic Signature(s) Signed: 03/06/2018 7:15:48 AM By: Worthy Keeler PA-C Entered By: Worthy Keeler on 03/01/2018 08:25:15 Winemiller, [name] ([id number]) -------------------------------------------------------------------------------- Progress Note Details Patient Name: [name]. Date of Service: 03/01/2018 8:30 AM Medical Record Number: [id number] Patient Account Number: [id number] Date of Birth/Sex: 1950/10/02 (67 y.o. M) Treating RN: Montey Hora Primary Care Provider: Benita Stabile Other Clinician: Referring Provider: Benita Stabile Treating Provider/Extender: Melburn Hake, HOYT Weeks in Treatment: 3 Subjective Chief Complaint Information obtained from Patient Right plantar foot ulcer History of Present Illness (HPI) The following HPI elements were documented for the patient's wound: Associated Signs and Symptoms: Patient has a history of diabetes mellitus type II, hypertension, and atrial fibrillation. He also has a hammertoe deformity of the bilateral feet which seems to be causing pressure in the ball of his foot. 03/09/17 on evaluation today patient presents for his initial evaluation concerning an ulcer on the plantar aspect of his right foot which has been open he tells me for about three weeks. He has been seen at [facility] where they did perform debridement it appears according to a note on 02/22/17 where unfortunately it appears that his callous area began to break down into an ulcer after modifications have been added to his insulin. This had happened previously as well. However I do not have details of the severity of debridement at that point although it does not sound as if you the debridement was too significant based on what the patient is telling me that he still had a lot of callous following  debridement. With that being said they were going to look into altering his insoles to try to prevent further breakdown in the future. Nonetheless he has had foul odor discharge coming from the ulcer which is noted all the way back to that visit [facility]. Patient states that this was concerning him more than anything else. He does have diabetes although he described this as "borderline diabetes" he is on metoprolol however along with lisinopril. Patient is not having any issues with pain in regard to his right plantar foot. 03/16/17 on evaluation today patient appears to be doing much better in regard to his plantar foot ulcer. He actually tells me that he loves the peg assist offloading shoe and that he hasn't had any pain in the ulcer area from the callous since I worked on it last week. Overall he is extremely happy with how things have progressed. Likewise the wound bed has no slough noted there is no evidence of infection and it looks excellent. I did receive the results of his hemoglobin A1c which showed a value of 5.9 which was elevated but actually rather well. Subsequently I did also receive the x-ray of his foot which showed diffuse degenerative change but no underlying acute bony abnormality. 03/23/17 on evaluation today patient appears to be doing better in regard to his right plantar foot ulcer. He continues to show signs of improvement there's definitely not as much drainage at this point. He has been tolerating the dressing changes without complication he is not happy with the peg assist offloading shoe but at the same time I do believe that it is making good progress as far as offloading is concerned. I still believe he may need to talk to a surgeon about surgically correcting a hammertoe in order to avoid additional pressure to the site especially since he Artie has diabetic shoes and they do not  seem to have prevented callous buildup in ulcer formation. 03/30/17  on evaluation today patient appears to be doing better in regard to his right plantar foot ulcer. Continues to show signs of improvement which is good news. Unfortunately we were unable to get the appointment with the orthopedic, Dr. Doran Durand, whom I recommended for him due to patient having a balance at the practice. With that being said patient's foot ulcer does seem to be doing much better on evaluation today he still has some depth to the wound but overall we are seeing improvement and epithelialization week by week. Hopefully this is something that will close shortly. 04/05/17 on evaluation today patient unfortunately has what appears to be an area of the plantar surface of his ulcer where he had fluid collection where the callous grew over top of the wound bed. Unfortunately there was some pus like material noted during cleansing that we did sent for culture today. Hopefully this is not truly an infection is or does not appear to be any CORDARIUS, BENNING. ([id number]) evidence of erythema surrounding and maybe this is just simply a small setback with a fluid collection that has caused this issue. Nonetheless we will see what that shows when we get the culture back. I am also going to have it completely antibiotic which I previously placed him on anyway which will hopefully prevent any true infection from setting in. 04/12/17 on evaluation today patient's ulcer on the plantar aspect of his right foot actually appears to be better then during last weeks evaluation. In general he has been tolerating the dressing changes in utilizing the offloading shoe which does seem to be beneficial for him. With that being said he still seems to get some pressure to the area just not nearly as much as he previously had noted. Again fortunately there is no significant pain. 04/19/17 on evaluation today patient appears to be doing excellent in regard to his right foot ulcer. He has been tolerating the dressing changes  without complication. There really has not been any drainage over the past few days he tells me. With that being said he seems to be doing excellent in my opinion at this point he does have some callous buildup. 04/26/17 On evaluation today patient's wound appears to be completely healed which is great news. He has been tolerating the dressing changes without complication and I do think he has done very well in regard to the healing process and offloading he has listed everything that that we instructed him to do. Obviously this appears to have paid off and he has progressed very nicely. Readmission: 07/05/17 on evaluation today patient is seen for fault evaluation and our clinic concerning the same issue that I have previously treated him for ending back in April 2019. This is a callous region with subsequently an ulcer underneath this on the plantar aspect of his right second metatarsal region. Fortunately he's not having any significant discomfort although his girlfriend told him that she noted an area of bruising at the site underneath the callous and wanted him to come get this checked out. He was very please with our care previous and therefore was more than happy to come let us check this for him. Upon inspection initially he did have significant callous overlying the area in question. It was not easily identifiable as far as any ulcer was concerned. With that being said he with this significant callous did require some sharp debridement and following debridement there did appear to  be a small ulcer underlying. Fortunately patient in general shows no signs of infection at this point. He again has neuropathy and therefore does not have any ongoing discomfort or pain at this point. 07/12/17 on evaluation today patient actually appears to be doing very well in regard to his plantar foot ulcer. In fact this appears to be completely healed and there's no residual opening at this time. Overall I'm very  pleased in this regard. Patient also is extremely pleased. Readmission: 02/08/18 patient presents today for follow-up evaluation he has previously been seen here in our office for the same issue. Fortunately there is not been any evidence of infection since I last saw him he does note that he is been going to see his local podiatrist for callous pairing. During that time they actually noted that he had an open wound and subsequently referred him back ties for evaluation of this wound. He has done very well in the past with good wound care often healing in just a few weeks. No fevers, chills, nausea, or vomiting noted at this time. 02/15/18 on evaluation today patient actually appears to be doing very well in regard to his plantar foot ulcer. He is been tolerating the dressing changes without complication. There does appear to be some eschar of the line the wound surface. Fortunately I am happy with how things are progressing from the standpoint of the wound progress from last week to this week. 02/22/18 on evaluation today patient appears to be doing very well in regard to his plantar foot ulcer. He did have callous buildup around the area that required some sharp debridement today. He tolerated the debridement without complication. Post debridement the wound bed appears to be doing much better there's just a very tiny opening still remaining. 03/01/18 on evaluation today patient appears to be doing excellent in regard to his plantar foot ulcer. In fact after I debrided away what appeared to be a dried blood clot on the surface and actually appear there was just a very small opening remaining in the central portion of the wound. This is literally a pinpoint region. Overall he has done well and I'm very pleased in this regard. I think that likely one more week will see this area completely healed. Patient History Information obtained from Patient. PHUOC, HUY ([id number]) Family History Cancer -  Siblings, Diabetes - Father, Heart Disease - Mother, Hypertension - Mother, No family history of Hereditary Spherocytosis, Kidney Disease, Lung Disease, Seizures, Stroke, Thyroid Problems, Tuberculosis. Social History Former smoker, Marital Status - Single, Alcohol Use - Never, Drug Use - No History, Caffeine Use - Moderate. Medical History Eyes Denies history of Cataracts, Glaucoma, Optic Neuritis Ear/Nose/Mouth/Throat Denies history of Chronic sinus problems/congestion, Middle ear problems Hematologic/Lymphatic Denies history of Anemia, Hemophilia, Human Immunodeficiency Virus, Lymphedema, Sickle Cell Disease Respiratory Denies history of Aspiration, Asthma, Chronic Obstructive Pulmonary Disease (COPD), Pneumothorax, Sleep Apnea, Tuberculosis Cardiovascular Patient has history of Arrhythmia - a fib, Hypertension Denies history of Angina, Congestive Heart Failure, Coronary Artery Disease, Deep Vein Thrombosis, Hypotension, Myocardial Infarction, Peripheral Arterial Disease, Peripheral Venous Disease, Phlebitis, Vasculitis Gastrointestinal Denies history of Cirrhosis , Colitis, Crohn s, Hepatitis A, Hepatitis B, Hepatitis C Endocrine Patient has history of Type II Diabetes Denies history of Type I Diabetes Genitourinary Denies history of End Stage Renal Disease Immunological Denies history of Lupus Erythematosus, Raynaud s, Scleroderma Integumentary (Skin) Denies history of History of Burn, History of pressure wounds Musculoskeletal Patient has history of Osteoarthritis Denies history of Gout,  Rheumatoid Arthritis, Osteomyelitis Neurologic Denies history of Dementia, Neuropathy, Quadriplegia, Paraplegia, Seizure Disorder Oncologic Denies history of Received Chemotherapy, Received Radiation Medical And Surgical History Notes Cardiovascular High Cholesterol Review of Systems (ROS) Constitutional Symptoms (General Health) Denies complaints or symptoms of Fever,  Chills. Respiratory The patient has no complaints or symptoms. Cardiovascular The patient has no complaints or symptoms. Psychiatric The patient has no complaints or symptoms. JACKEY, HOUSEY ([id number]) Objective Constitutional Well-nourished and well-hydrated in no acute distress. Vitals Time Taken: 8:27 AM, Height: 74 in, Weight: 240.9 lbs, BMI: 30.9, Temperature: 98.2 F, Pulse: 97 bpm, Respiratory Rate: 16 breaths/min, Blood Pressure: 154/83 mmHg. Respiratory normal breathing without difficulty. Psychiatric this patient is able to make decisions and demonstrates good insight into disease process. Alert and Oriented x 3. pleasant and cooperative. General Notes: Patient's wound bed currently shows evidence of good granulation at this time which is excellent news. He has good epithelialization as well and want to remove the callous and dried blood from the surface of the wound it appears this is indeed completely healed all except for a small pinpoint region in the central aspect of the wound. This is obviously good news. Integumentary (Hair, Skin) Wound #3 status is Open. Original cause of wound was Footwear Injury. The wound is located on the Right,Proximal,Plantar Foot. The wound measures 0.1cm length x 0.1cm width x 0.1cm depth; 0.008cm^2 area and 0.001cm^3 volume. There is Fat Layer (Subcutaneous Tissue) Exposed exposed. There is no tunneling or undermining noted. There is a none present amount of drainage noted. The wound margin is indistinct and nonvisible. There is no granulation within the wound bed. There is a medium (34-66%) amount of necrotic tissue within the wound bed including Eschar. The periwound skin appearance exhibited: Callus. The periwound skin appearance did not exhibit: Crepitus, Excoriation, Induration, Rash, Scarring, Dry/Scaly, Maceration, Atrophie Blanche, Cyanosis, Ecchymosis, Hemosiderin Staining, Mottled, Pallor, Rubor, Erythema. Periwound temperature  was noted as No Abnormality. Assessment Active Problems ICD-10 Type 2 diabetes mellitus with foot ulcer Non-pressure chronic ulcer of other part of right foot with fat layer exposed Essential (primary) hypertension Chronic atrial fibrillation, unspecified Procedures Wound #3 Pre-procedure diagnosis of Wound #3 is a Diabetic Wound/Ulcer of the Lower Extremity located on the Right,Proximal,Plantar Foot .Severity of Tissue Pre Debridement is: Fat layer exposed. There was a Excisional Skin/Subcutaneous Tissue Hutt, Werner J. ([id number]) Debridement with a total area of 0.01 sq cm performed by STONE III, HOYT E., PA-C. With the following instrument(s): Curette to remove Viable and Non-Viable tissue/material. Material removed includes Callus, Subcutaneous Tissue, and Slough after achieving pain control using Lidocaine 4% Topical Solution. No specimens were taken. A time out was conducted at 08:57, prior to the start of the procedure. A Minimum amount of bleeding was controlled with Pressure. The procedure was tolerated well with a pain level of 0 throughout and a pain level of 0 following the procedure. Post Debridement Measurements: 0.1cm length x 0.1cm width x 0.2cm depth; 0.002cm^3 volume. Character of Wound/Ulcer Post Debridement is improved. Severity of Tissue Post Debridement is: Fat layer exposed. Post procedure Diagnosis Wound #3: Same as Pre-Procedure Plan Wound Cleansing: Wound #3 Right,Proximal,Plantar Foot: Clean wound with Normal Saline. Anesthetic (add to Medication List): Wound #3 Right,Proximal,Plantar Foot: Topical Lidocaine 4% cream applied to wound bed prior to debridement (In Clinic Only). Primary Wound Dressing: Wound #3 Right,Proximal,Plantar Foot: Xeroform Secondary Dressing: Wound #3 Right,Proximal,Plantar Foot: [place] Dressing Change Frequency: Wound #3 Right,Proximal,Plantar Foot: Change dressing every other day. Follow-up Appointments: Wound #3  Right,Proximal,Plantar Foot: Return Appointment in 1 week. Off-Loading: Wound #3 Right,Proximal,Plantar Foot: Open toe surgical shoe to: - right foot My suggestion at this point is gonna be that we go ahead and continue with the above wound care measures for the next week. Patient is in agreement the plan. We will see were things stand at follow-up. If anything changes or worsens in the meantime he knows he will contact the office and let me know my hope is that he will continue to make excellent progress in this will be completely healed come next week. We did switch the Xeroform to prevent anything from drying into the area and allowing it to hopefully finish complete epithelialization and be completely closed as of next week. If anything changes he'll let me know. Please see above for specific wound care orders. We will see patient for re-evaluation in 1 week(s) here in the clinic. If anything worsens or changes patient will contact our office for additional recommendations. Electronic Signature(s) Signed: 03/06/2018 7:15:48 AM By: Worthy Keeler PA-C Entered By: Worthy Keeler on 03/01/2018 09:11:31 [place], [name] ([id number]) -------------------------------------------------------------------------------- ROS/PFSH Details Patient Name: [name] Date of Service: 03/01/2018 8:30 AM Medical Record Number: [id number] Patient Account Number: [id number] Date of Birth/Sex: 12/06/1950 (67 y.o. M) Treating RN: Montey Hora Primary Care Provider: Benita Stabile Other Clinician: Referring Provider: Benita Stabile Treating Provider/Extender: STONE III, HOYT Weeks in Treatment: 3 Information Obtained From Patient Wound History Do you currently have one or more open woundso Yes How many open wounds do you currently haveo 1 Approximately how long have you had your woundso 2 weeks How have you been treating your wound(s) until nowo silvercell Has your wound(s) ever healed and then  re-openedo Yes Have you had any lab work done in the past montho Yes Who ordered the lab work doneo Benita Stabile PCP Have you tested positive for an antibiotic resistant organism (MRSA, VRE)o No Have you tested positive for osteomyelitis (bone infection)o No Have you had any tests for circulation on your legso Yes Constitutional Symptoms (General Health) Complaints and Symptoms: Negative for: Fever; Chills Eyes Medical History: Negative for: Cataracts; Glaucoma; Optic Neuritis Ear/Nose/Mouth/Throat Medical History: Negative for: Chronic sinus problems/congestion; Middle ear problems Hematologic/Lymphatic Medical History: Negative for: Anemia; Hemophilia; Human Immunodeficiency Virus; Lymphedema; Sickle Cell Disease Respiratory Complaints and Symptoms: No Complaints or Symptoms Medical History: Negative for: Aspiration; Asthma; Chronic Obstructive Pulmonary Disease (COPD); Pneumothorax; Sleep Apnea; Tuberculosis Cardiovascular Complaints and Symptoms: No Complaints or Symptoms Medical History: DEROLD, DORSCH ([id number]) Positive for: Arrhythmia - a fib; Hypertension Negative for: Angina; Congestive Heart Failure; Coronary Artery Disease; Deep Vein Thrombosis; Hypotension; Myocardial Infarction; Peripheral Arterial Disease; Peripheral Venous Disease; Phlebitis; Vasculitis Past Medical History Notes: High Cholesterol Gastrointestinal Medical History: Negative for: Cirrhosis ; Colitis; Crohnos; Hepatitis A; Hepatitis B; Hepatitis C Endocrine Medical History: Positive for: Type II Diabetes Negative for: Type I Diabetes Time with diabetes: 3 years Treated with: Oral agents Blood sugar tested every day: No Genitourinary Medical History: Negative for: End Stage Renal Disease Immunological Medical History: Negative for: Lupus Erythematosus; Raynaudos; Scleroderma Integumentary (Skin) Medical History: Negative for: History of Burn; History of pressure  wounds Musculoskeletal Medical History: Positive for: Osteoarthritis Negative for: Gout; Rheumatoid Arthritis; Osteomyelitis Neurologic Medical History: Negative for: Dementia; Neuropathy; Quadriplegia; Paraplegia; Seizure Disorder Oncologic Medical History: Negative for: Received Chemotherapy; Received Radiation Psychiatric Complaints and Symptoms: No Complaints or Symptoms Immunizations Pneumococcal Vaccine: NICKEY, KLOEPFER ([id number]) Received Pneumococcal Vaccination: Yes Immunization Notes: up to date  Implantable Devices Family and Social History Cancer: Yes - Siblings; Diabetes: Yes - Father; Heart Disease: Yes - Mother; Hereditary Spherocytosis: No; Hypertension: Yes - Mother; Kidney Disease: No; Lung Disease: No; Seizures: No; Stroke: No; Thyroid Problems: No; Tuberculosis: No; Former smoker; Marital Status - Single; Alcohol Use: Never; Drug Use: No History; Caffeine Use: Moderate; Financial Concerns: No; Food, Clothing or Shelter Needs: No; Support System Lacking: No; Transportation Concerns: No; Advanced Directives: No; Patient does not want information on Advanced Directives; Do not resuscitate: No; Living Will: No; Medical Power of Attorney: No Physician Affirmation I have reviewed and agree with the above information. Electronic Signature(s) Signed: 03/01/2018 4:52:44 PM By: Montey Hora Signed: 03/06/2018 7:15:48 AM By: Worthy Keeler PA-C Entered By: Worthy Keeler on 03/01/2018 09:10:43 [place], [name] ([id number]) -------------------------------------------------------------------------------- SuperBill Details Patient Name: RAMSES, KLECKA. Date of Service: 03/01/2018 Medical Record Number: [id number] Patient Account Number: [id number] Date of Birth/Sex: [birth year] (68 y.o. M) Treating RN: Montey Hora Primary Care Provider: Benita Stabile Other Clinician: Referring Provider: Benita Stabile Treating Provider/Extender: Melburn Hake, HOYT Weeks in Treatment:  3 Diagnosis Coding ICD-10 Codes Code Description E11.621 Type 2 diabetes mellitus with foot ulcer L97.512 Non-pressure chronic ulcer of other part of right foot with fat layer exposed I10 Essential (primary) hypertension I48.20 Chronic atrial fibrillation, unspecified Facility Procedures CPT4 Code: [id number] Description: 11042 - DEB SUBQ TISSUE 20 SQ CM/< ICD-10 Diagnosis Description L97.512 Non-pressure chronic ulcer of other part of right foot with fa Modifier: t layer exposed Quantity: 1 Physician Procedures CPT4 Code: [id number] Description: 11042 - WC PHYS SUBQ TISS 20 SQ CM ICD-10 Diagnosis Description L97.512 Non-pressure chronic ulcer of other part of right foot with fa Modifier: t layer exposed Quantity: 1 Electronic Signature(s) Signed: 03/06/2018 7:15:48 AM By: Worthy Keeler PA-C Entered By: Worthy Keeler on 03/01/2018 09:11:38

## 2018-03-06 NOTE — Progress Notes (Signed)
[name], [name] ([id number]) Visit Report for 03/01/2018 Arrival Information Details Patient Name: [name], [name]. Date of Service: 03/01/2018 8:30 AM Medical Record Number: [id number] Patient Account Number: [id number] Date of Birth/Sex: [birth year] (67 y.o. M) Treating RN: Montey Hora Primary Care Josalin Carneiro: Benita Stabile Other Clinician: Referring Christion Leonhard: Benita Stabile Treating Alayia Meggison/Extender: Melburn Hake, HOYT Weeks in Treatment: 3 Visit Information History Since Last Visit Added or deleted any medications: No Patient Arrived: Ambulatory Any new allergies or adverse reactions: No Arrival Time: 08:26 Had a fall or experienced change in No Accompanied By: self activities of daily living that may affect Transfer Assistance: None risk of falls: Patient Identification Verified: Yes Signs or symptoms of abuse/neglect since last visito No Secondary Verification Process Yes Hospitalized since last visit: No Completed: Implantable device outside of the clinic excluding No Patient Has Alerts: Yes cellular tissue based products placed in the center Patient Alerts: Patient on Blood since last visit: Thinner Has Dressing in Place as Prescribed: Yes 03/02/17 Pain Present Now: No ABI (L) 1.14 (R) 1.20 81MG  aspirin Electronic Signature(s) Signed: 03/01/2018 3:52:51 PM By: Lorine Bears RCP, RRT, CHT Entered By: Lorine Bears on 03/01/2018 08:27:15 [name], [name] ([id number]) -------------------------------------------------------------------------------- Encounter Discharge Information Details Patient Name: [name], [name]. Date of Service: 03/01/2018 8:30 AM Medical Record Number: [id number] Patient Account Number: [id number] Date of Birth/Sex: [birth year] (67 y.o. M) Treating RN: Montey Hora Primary Care Albirda Shiel: Benita Stabile Other Clinician: Referring Jadiel Schmieder: TATE, Sharlet Salina Treating Lindsey Demonte/Extender: Melburn Hake, HOYT Weeks in Treatment: 3 Encounter  Discharge Information Items Post Procedure Vitals Discharge Condition: Stable Temperature (F): 98.2 Ambulatory Status: Ambulatory Pulse (bpm): 97 Discharge Destination: Home Respiratory Rate (breaths/min): 18 Transportation: Private Auto Blood Pressure (mmHg): 154/83 Accompanied By: self Schedule Follow-up Appointment: Yes Clinical Summary of Care: Electronic Signature(s) Signed: 03/01/2018 4:52:44 PM By: Montey Hora Entered By: Montey Hora on 03/01/2018 09:05:49 [name], [name] ([id number]) -------------------------------------------------------------------------------- Lower Extremity Assessment Details Patient Name: [name]. Date of Service: 03/01/2018 8:30 AM Medical Record Number: [id number] Patient Account Number: [id number] Date of Birth/Sex: Jun 06, 1950 (67 y.o. M) Treating RN: Army Melia Primary Care Daveda Larock: Benita Stabile Other Clinician: Referring Amillion Scobee: Benita Stabile Treating Mckala Pantaleon/Extender: STONE III, HOYT Weeks in Treatment: 3 Edema Assessment Assessed: [Left: No] [Right: No] Edema: [Left: N] [Right: o] Vascular Assessment Pulses: Dorsalis Pedis Palpable: [Right:Yes] Posterior Tibial Extremity colors, hair growth, and conditions: Extremity Color: [Right:Normal] Hair Growth on Extremity: [Right:Yes] Temperature of Extremity: [Right:Warm] Capillary Refill: [Right:< 3 seconds] Toe Nail Assessment Left: Right: Thick: Yes Discolored: Yes Deformed: No Improper Length and Hygiene: No Electronic Signature(s) Signed: 03/01/2018 4:35:39 PM By: Army Melia Entered By: Army Melia on 03/01/2018 08:44:02 Jeff, [name] ([id number]) -------------------------------------------------------------------------------- Multi Wound Chart Details Patient Name: [name]. Date of Service: 03/01/2018 8:30 AM Medical Record Number: [id number] Patient Account Number: [id number] Date of Birth/Sex: 1950-11-05 (67 y.o. M) Treating RN: Montey Hora Primary Care Boston Cookson: Benita Stabile Other Clinician: Referring Eliu Batch: Benita Stabile Treating Lenny Fiumara/Extender: STONE III, HOYT Weeks in Treatment: 3 Vital Signs Height(in): 74 Pulse(bpm): 97 Weight(lbs): 240.9 Blood Pressure(mmHg): 154/83 Body Mass Index(BMI): 31 Temperature(F): 98.2 Respiratory Rate 16 (breaths/min): Photos: [3:No Photos] [N/A:N/A] Wound Location: [3:Right Foot - Plantar, Proximal] [N/A:N/A] Wounding Event: [3:Footwear Injury] [N/A:N/A] Primary Etiology: [3:Diabetic Wound/Ulcer of the Lower Extremity] [N/A:N/A] Comorbid History: [3:Arrhythmia, Hypertension, Type II Diabetes, Osteoarthritis] [N/A:N/A] Date Acquired: [3:01/25/2018] [N/A:N/A] Weeks of Treatment: [3:3] [N/A:N/A] Wound Status: [3:Open] [N/A:N/A] Measurements L x W x D [3:0.1x0.1x0.1] [N/A:N/A] (cm) Area (cm) : [3:0.008] [N/A:N/A]  Volume (cm) : [3:0.001] [N/A:N/A] % Reduction in Area: [3:93.70%] [N/A:N/A] % Reduction in Volume: [3:99.10%] [N/A:N/A] Classification: [3:Grade 2] [N/A:N/A] Exudate Amount: [3:None Present] [N/A:N/A] Wound Margin: [3:Indistinct, nonvisible] [N/A:N/A] Granulation Amount: [3:None Present (0%)] [N/A:N/A] Necrotic Amount: [3:Medium (34-66%)] [N/A:N/A] Necrotic Tissue: [3:Eschar] [N/A:N/A] Exposed Structures: [3:Fat Layer (Subcutaneous Tissue) Exposed: Yes Fascia: No Tendon: No Muscle: No Joint: No Bone: No] [N/A:N/A] Epithelialization: [3:Medium (34-66%)] [N/A:N/A] Periwound Skin Texture: [3:Callus: Yes Excoriation: No Induration: No Crepitus: No Rash: No Scarring: No] [N/A:N/A] Periwound Skin Moisture: Maceration: No N/A N/A Dry/Scaly: No Periwound Skin Color: Atrophie Blanche: No N/A N/A Cyanosis: No Ecchymosis: No Erythema: No Hemosiderin Staining: No Mottled: No Pallor: No Rubor: No Temperature: No Abnormality N/A N/A Tenderness on Palpation: No N/A N/A Wound Preparation: Ulcer Cleansing: N/A N/A Rinsed/Irrigated with Saline Topical  Anesthetic Applied: Other: lidocaine 4% Treatment Notes Electronic Signature(s) Signed: 03/01/2018 4:52:44 PM By: Montey Hora Entered By: Montey Hora on 03/01/2018 08:58:05 [place]rum, [name] ([id number]) -------------------------------------------------------------------------------- Multi-Disciplinary Care Plan Details Patient Name: [name], [name]. Date of Service: 03/01/2018 8:30 AM Medical Record Number: [id number] Patient Account Number: [id number] Date of Birth/Sex: 31-Dec-1950 (67 y.o. M) Treating RN: Montey Hora Primary Care Dorena Dorfman: Benita Stabile Other Clinician: Referring Tiron Suski: Benita Stabile Treating Leman Martinek/Extender: Melburn Hake, HOYT Weeks in Treatment: 3 Active Inactive Abuse / Safety / Falls / Self Care Management Nursing Diagnoses: Potential for falls Goals: Patient will not experience any injury related to falls Date Initiated: 02/08/2018 Target Resolution Date: 04/23/2018 Goal Status: Active Interventions: Assess fall risk on admission and as needed Notes: Orientation to the Wound Care Program Nursing Diagnoses: Knowledge deficit related to the wound healing center program Goals: Patient/caregiver will verbalize understanding of the [facility] Date Initiated: 02/08/2018 Target Resolution Date: 04/23/2018 Goal Status: Active Interventions: Provide education on orientation to the wound center Notes: Wound/Skin Impairment Nursing Diagnoses: Impaired tissue integrity Goals: Ulcer/skin breakdown will heal within 14 weeks Date Initiated: 02/08/2018 Target Resolution Date: 04/23/2018 Goal Status: Active Interventions: Assess patient/caregiver ability to obtain necessary supplies [name], [name] ([id number]) Assess patient/caregiver ability to perform ulcer/skin care regimen upon admission and as needed Assess ulceration(s) every visit Notes: Electronic Signature(s) Signed: 03/01/2018 4:52:44 PM By: Montey Hora Entered By: Montey Hora  on 03/01/2018 08:57:58 [name], [name] ([id number]) -------------------------------------------------------------------------------- Pain Assessment Details Patient Name: [name]. Date of Service: 03/01/2018 8:30 AM Medical Record Number: [id number] Patient Account Number: [id number] Date of Birth/Sex: 11-06-1950 (67 y.o. M) Treating RN: Montey Hora Primary Care Makailah Slavick: Benita Stabile Other Clinician: Referring Lovada Barwick: Benita Stabile Treating Vianka Ertel/Extender: Melburn Hake, HOYT Weeks in Treatment: 3 Active Problems Location of Pain Severity and Description of Pain Patient Has Paino No Site Locations Pain Management and Medication Current Pain Management: Electronic Signature(s) Signed: 03/01/2018 3:52:51 PM By: Paulla Fore, RRT, CHT Signed: 03/01/2018 4:52:44 PM By: Montey Hora Entered By: Lorine Bears on 03/01/2018 08:27:24 [name], [name] ([id number]) -------------------------------------------------------------------------------- Patient/Caregiver Education Details Patient Name: [name], [name]. Date of Service: 03/01/2018 8:30 AM Medical Record Number: [id number] Patient Account Number: [id number] Date of Birth/Gender: May 15, 1950 (67 y.o. M) Treating RN: Montey Hora Primary Care Physician: Benita Stabile Other Clinician: Referring Physician: Benita Stabile Treating Physician/Extender: Sharalyn Ink in Treatment: 3 Education Assessment Education Provided To: Patient Education Topics Provided Wound/Skin Impairment: Handouts: Other: wound care as ordered Methods: Demonstration, Explain/Verbal Responses: State content correctly Electronic Signature(s) Signed: 03/01/2018 4:52:44 PM By: Montey Hora Entered By: Montey Hora on 03/01/2018 09:06:05 [name], [name] ([id number]) -------------------------------------------------------------------------------- Wound  Assessment Details Patient Name: [name], [name]. Date of  Service: 03/01/2018 8:30 AM Medical Record Number: [id number] Patient Account Number: [id number] Date of Birth/Sex: 08/06/1950 (67 y.o. M) Treating RN: Army Melia Primary Care Aeon Kessner: Benita Stabile Other Clinician: Referring Jalaina Salyers: TATE, Sharlet Salina Treating Shebra Muldrow/Extender: STONE III, HOYT Weeks in Treatment: 3 Wound Status Wound Number: 3 Primary Diabetic Wound/Ulcer of the Lower Extremity Etiology: Wound Location: Right Foot - Plantar, Proximal Wound Status: Open Wounding Event: Footwear Injury Comorbid Arrhythmia, Hypertension, Type II Diabetes, Date Acquired: 01/25/2018 History: Osteoarthritis Weeks Of Treatment: 3 Clustered Wound: No Photos Photo Uploaded By: Army Melia on 03/01/2018 10:30:45 Wound Measurements Length: (cm) 0.1 Width: (cm) 0.1 Depth: (cm) 0.1 Area: (cm) 0.008 Volume: (cm) 0.001 % Reduction in Area: 93.7% % Reduction in Volume: 99.1% Epithelialization: Medium (34-66%) Tunneling: No Undermining: No Wound Description Classification: Grade 2 Wound Margin: Indistinct, nonvisible Exudate Amount: None Present Foul Odor After Cleansing: No Slough/Fibrino No Wound Bed Granulation Amount: None Present (0%) Exposed Structure Necrotic Amount: Medium (34-66%) Fascia Exposed: No Necrotic Quality: Eschar Fat Layer (Subcutaneous Tissue) Exposed: Yes Tendon Exposed: No Muscle Exposed: No Joint Exposed: No Bone Exposed: No Periwound Skin Texture Texture Color No Abnormalities Noted: No No Abnormalities Noted: No Easler, Rajohn J. ([id number]) Callus: Yes Atrophie Blanche: No Crepitus: No Cyanosis: No Excoriation: No Ecchymosis: No Induration: No Erythema: No Rash: No Hemosiderin Staining: No Scarring: No Mottled: No Pallor: No Moisture Rubor: No No Abnormalities Noted: No Dry / Scaly: No Temperature / Pain Maceration: No Temperature: No Abnormality Wound Preparation Ulcer Cleansing: Rinsed/Irrigated with Saline Topical Anesthetic  Applied: Other: lidocaine 4%, Treatment Notes Wound #3 (Right, Proximal, Plantar Foot) Notes xeroform, [place]a [place]; patient has offloading shoe Electronic Signature(s) Signed: 03/01/2018 4:35:39 PM By: Army Melia Entered By: Army Melia on 03/01/2018 08:43:16 Clover, [name] ([id number]) -------------------------------------------------------------------------------- Vitals Details Patient Name: [name]. Date of Service: 03/01/2018 8:30 AM Medical Record Number: [id number] Patient Account Number: [id number] Date of Birth/Sex: 22-Jan-1950 (67 y.o. M) Treating RN: Montey Hora Primary Care Markeith Jue: Benita Stabile Other Clinician: Referring Belvie Iribe: Benita Stabile Treating Kaylub Detienne/Extender: STONE III, HOYT Weeks in Treatment: 3 Vital Signs Time Taken: 08:27 Temperature (F): 98.2 Height (in): 74 Pulse (bpm): 97 Weight (lbs): 240.9 Respiratory Rate (breaths/min): 16 Body Mass Index (BMI): 30.9 Blood Pressure (mmHg): 154/83 Reference Range: 80 - 120 mg / dl Airway Electronic Signature(s) Signed: 03/01/2018 3:52:51 PM By: Lorine Bears RCP, RRT, CHT Entered By: Lorine Bears on 03/01/2018 08:30:36

## 2018-03-07 ENCOUNTER — Ambulatory Visit: Payer: PPO | Admitting: Podiatry

## 2018-03-08 ENCOUNTER — Encounter: Payer: PPO | Admitting: Physician Assistant

## 2018-03-08 DIAGNOSIS — Z09 Encounter for follow-up examination after completed treatment for conditions other than malignant neoplasm: Secondary | ICD-10-CM | POA: Diagnosis not present

## 2018-03-08 DIAGNOSIS — L97512 Non-pressure chronic ulcer of other part of right foot with fat layer exposed: Secondary | ICD-10-CM | POA: Diagnosis not present

## 2018-03-09 NOTE — Progress Notes (Signed)
[name], [name] ([id number]) Visit Report for 03/08/2018 Arrival Information Details Patient Name: [name], [name]. Date of Service: 03/08/2018 8:15 AM Medical Record Number: [id number] Patient Account Number: [id number] Date of Birth/Sex: 1950-08-29 (67 y.o. M) Treating RN: Montey Hora Primary Care Anas Reister: Benita Stabile Other Clinician: Referring Stephenson Cichy: Benita Stabile Treating Rica Heather/Extender: Melburn Hake, HOYT Weeks in Treatment: 4 Visit Information History Since Last Visit Added or deleted any medications: No Patient Arrived: Ambulatory Any new allergies or adverse reactions: No Arrival Time: 08:10 Had a fall or experienced change in No Accompanied By: self activities of daily living that may affect Transfer Assistance: None risk of falls: Patient Identification Verified: Yes Signs or symptoms of abuse/neglect since last visito No Secondary Verification Process Yes Hospitalized since last visit: No Completed: Implantable device outside of the clinic excluding No Patient Has Alerts: Yes cellular tissue based products placed in the center Patient Alerts: Patient on Blood since last visit: Thinner Has Dressing in Place as Prescribed: Yes 03/02/17 Pain Present Now: No ABI (L) 1.14 (R) 1.20 81MG  aspirin Electronic Signature(s) Signed: 03/08/2018 4:31:59 PM By: Lorine Bears RCP, RRT, CHT Entered By: Lorine Bears on 03/08/2018 08:12:22 [name] ([id number]) -------------------------------------------------------------------------------- Clinic Level of Care Assessment Details Patient Name: BELVIN, GAUSS. Date of Service: 03/08/2018 8:15 AM Medical Record Number: [id number] Patient Account Number: [id number] Date of Birth/Sex: [birth year] (67 y.o. M) Treating RN: Montey Hora Primary Care Jazzman Loughmiller: Benita Stabile Other Clinician: Referring Almeta Geisel: Benita Stabile Treating Shatona Andujar/Extender: Melburn Hake, HOYT Weeks in Treatment: 4 Clinic  Level of Care Assessment Items TOOL 4 Quantity Score []  - Use when only an EandM is performed on FOLLOW-UP visit 0 ASSESSMENTS - Nursing Assessment / Reassessment X - Reassessment of Co-morbidities (includes updates in patient status) 1 10 X- 1 5 Reassessment of Adherence to Treatment Plan ASSESSMENTS - Wound and Skin Assessment / Reassessment X - Simple Wound Assessment / Reassessment - one wound 1 5 []  - 0 Complex Wound Assessment / Reassessment - multiple wounds []  - 0 Dermatologic / Skin Assessment (not related to wound area) ASSESSMENTS - Focused Assessment []  - Circumferential Edema Measurements - multi extremities 0 []  - 0 Nutritional Assessment / Counseling / Intervention X- 1 5 Lower Extremity Assessment (monofilament, tuning fork, pulses) []  - 0 Peripheral Arterial Disease Assessment (using hand held doppler) ASSESSMENTS - Ostomy and/or Continence Assessment and Care []  - Incontinence Assessment and Management 0 []  - 0 Ostomy Care Assessment and Management (repouching, etc.) PROCESS - Coordination of Care X - Simple Patient / Family Education for ongoing care 1 15 []  - 0 Complex (extensive) Patient / Family Education for ongoing care X- 1 10 Staff obtains Programmer, systems, Records, Test Results / Process Orders []  - 0 Staff telephones HHA, Nursing Homes / Clarify orders / etc []  - 0 Routine Transfer to another Facility (non-emergent condition) []  - 0 Routine Hospital Admission (non-emergent condition) []  - 0 New Admissions / Biomedical engineer / Ordering NPWT, Apligraf, etc. []  - 0 Emergency Hospital Admission (emergent condition) X- 1 10 Simple Discharge Coordination [name], GAULIN. ([id number]) []  - 0 Complex (extensive) Discharge Coordination PROCESS - Special Needs []  - Pediatric / Minor Patient Management 0 []  - 0 Isolation Patient Management []  - 0 Hearing / Language / Visual special needs []  - 0 Assessment of Community assistance (transportation,  D/C planning, etc.) []  - 0 Additional assistance / Altered mentation []  - 0 Support Surface(s) Assessment (bed, cushion, seat, etc.) INTERVENTIONS - Wound Cleansing / Measurement X -  Simple Wound Cleansing - one wound 1 5 []  - 0 Complex Wound Cleansing - multiple wounds X- 1 5 Wound Imaging (photographs - any number of wounds) []  - 0 Wound Tracing (instead of photographs) X- 1 5 Simple Wound Measurement - one wound []  - 0 Complex Wound Measurement - multiple wounds INTERVENTIONS - Wound Dressings []  - Small Wound Dressing one or multiple wounds 0 []  - 0 Medium Wound Dressing one or multiple wounds []  - 0 Large Wound Dressing one or multiple wounds []  - 0 Application of Medications - topical []  - 0 Application of Medications - injection INTERVENTIONS - Miscellaneous []  - External ear exam 0 []  - 0 Specimen Collection (cultures, biopsies, blood, body fluids, etc.) []  - 0 Specimen(s) / Culture(s) sent or taken to Lab for analysis []  - 0 Patient Transfer (multiple staff / Civil Service fast streamer / Similar devices) []  - 0 Simple Staple / Suture removal (25 or less) []  - 0 Complex Staple / Suture removal (26 or more) []  - 0 Hypo / Hyperglycemic Management (close monitor of Blood Glucose) []  - 0 Ankle / Brachial Index (ABI) - do not check if billed separately X- 1 5 Vital Signs [place], Nikoloz J. ([id number]) Has the patient been seen at the hospital within the last three years: Yes Total Score: 80 Level Of Care: New/Established - Level 3 Electronic Signature(s) Signed: 03/08/2018 4:18:49 PM By: Montey Hora Entered By: Montey Hora on 03/08/2018 08:50:02 [name], [name] ([id number]) -------------------------------------------------------------------------------- Encounter Discharge Information Details Patient Name: [name]. Date of Service: 03/08/2018 8:15 AM Medical Record Number: [id number] Patient Account Number: [id number] Date of Birth/Sex: 07/15/1950 (67 y.o.  M) Treating RN: Montey Hora Primary Care Coley Littles: Benita Stabile Other Clinician: Referring Mekhia Brogan: Benita Stabile Treating Kewanna Kasprzak/Extender: Melburn Hake, HOYT Weeks in Treatment: 4 Encounter Discharge Information Items Discharge Condition: Stable Ambulatory Status: Ambulatory Discharge Destination: Home Transportation: Private Auto Accompanied By: self Schedule Follow-up Appointment: No Clinical Summary of Care: Electronic Signature(s) Signed: 03/08/2018 4:18:49 PM By: Montey Hora Entered By: Montey Hora on 03/08/2018 08:50:24 [name], [name] ([id number]) -------------------------------------------------------------------------------- Lower Extremity Assessment Details Patient Name: [name]. Date of Service: 03/08/2018 8:15 AM Medical Record Number: [id number] Patient Account Number: [id number] Date of Birth/Sex: Dec 12, 1950 (67 y.o. M) Treating RN: Harold Barban Primary Care Isaack Preble: Benita Stabile Other Clinician: Referring Ruslan Mccabe: Benita Stabile Treating Kipper Buch/Extender: Melburn Hake, HOYT Weeks in Treatment: 4 Electronic Signature(s) Signed: 03/09/2018 8:37:22 AM By: Harold Barban Entered By: Harold Barban on 03/08/2018 08:21:51 [name], [name] ([id number]) -------------------------------------------------------------------------------- Multi Wound Chart Details Patient Name: [name]. Date of Service: 03/08/2018 8:15 AM Medical Record Number: [id number] Patient Account Number: [id number] Date of Birth/Sex: 1950-09-29 (67 y.o. M) Treating RN: Montey Hora Primary Care Maddeline Roorda: Benita Stabile Other Clinician: Referring Timmy Bubeck: Benita Stabile Treating Lekia Nier/Extender: STONE III, HOYT Weeks in Treatment: 4 Vital Signs Height(in): 74 Pulse(bpm): 82 Weight(lbs): 240.9 Blood Pressure(mmHg): 144/72 Body Mass Index(BMI): 31 Temperature(F): 97.6 Respiratory Rate 16 (breaths/min): Photos: [3:No Photos] [N/A:N/A] Wound Location: [3:Right Foot -  Plantar, Proximal] [N/A:N/A] Wounding Event: [3:Footwear Injury] [N/A:N/A] Primary Etiology: [3:Diabetic Wound/Ulcer of the Lower Extremity] [N/A:N/A] Comorbid History: [3:Arrhythmia, Hypertension, Type II Diabetes, Osteoarthritis] [N/A:N/A] Date Acquired: [3:01/25/2018] [N/A:N/A] Weeks of Treatment: [3:4] [N/A:N/A] Wound Status: [3:Open] [N/A:N/A] Measurements L x W x D [3:0.1x0.1x0.1] [N/A:N/A] (cm) Area (cm) : [3:0.008] [N/A:N/A] Volume (cm) : [3:0.001] [N/A:N/A] % Reduction in Area: [3:93.70%] [N/A:N/A] % Reduction in Volume: [3:99.10%] [N/A:N/A] Classification: [3:Grade 2] [N/A:N/A] Exudate Amount: [3:None Present] [N/A:N/A] Wound Margin: [3:Indistinct, nonvisible] [N/A:N/A]  Granulation Amount: [3:None Present (0%)] [N/A:N/A] Necrotic Amount: [3:Medium (34-66%)] [N/A:N/A] Necrotic Tissue: [3:Eschar] [N/A:N/A] Exposed Structures: [3:Fat Layer (Subcutaneous Tissue) Exposed: Yes Fascia: No Tendon: No Muscle: No Joint: No Bone: No] [N/A:N/A] Epithelialization: [3:Medium (34-66%)] [N/A:N/A] Periwound Skin Texture: [3:Callus: Yes Excoriation: No Induration: No Crepitus: No Rash: No Scarring: No] [N/A:N/A] Periwound Skin Moisture: Maceration: No N/A N/A Dry/Scaly: No Periwound Skin Color: Atrophie Blanche: No N/A N/A Cyanosis: No Ecchymosis: No Erythema: No Hemosiderin Staining: No Mottled: No Pallor: No Rubor: No Temperature: No Abnormality N/A N/A Tenderness on Palpation: No N/A N/A Wound Preparation: Ulcer Cleansing: N/A N/A Rinsed/Irrigated with Saline Topical Anesthetic Applied: Other: lidocaine 4% Treatment Notes Electronic Signature(s) Signed: 03/08/2018 4:18:49 PM By: Montey Hora Entered By: Montey Hora on 03/08/2018 08:32:20 [name], [name] ([id number]) -------------------------------------------------------------------------------- Multi-Disciplinary Care Plan Details Patient Name: [name], [name]. Date of Service: 03/08/2018 8:15 AM Medical Record  Number: [id number] Patient Account Number: [id number] Date of Birth/Sex: 15-Dec-1950 (67 y.o. M) Treating RN: Montey Hora Primary Care Cald[place] Leap: Benita Stabile Other Clinician: Referring Jacobe Study: Benita Stabile Treating Hari Casaus/Extender: Melburn Hake, HOYT Weeks in Treatment: 4 Active Inactive Electronic Signature(s) Signed: 03/08/2018 4:18:49 PM By: Montey Hora Entered By: Montey Hora on 03/08/2018 08:35:30 [name], [name] ([id number]) -------------------------------------------------------------------------------- Pain Assessment Details Patient Name: [name]. Date of Service: 03/08/2018 8:15 AM Medical Record Number: [id number] Patient Account Number: [id number] Date of Birth/Sex: [birth year] (67 y.o. M) Treating RN: Montey Hora Primary Care Maclovia Uher: Benita Stabile Other Clinician: Referring Nazaret Chea: Benita Stabile Treating Rueben Kassim/Extender: Melburn Hake, HOYT Weeks in Treatment: 4 Active Problems Location of Pain Severity and Description of Pain Patient Has Paino No Site Locations Pain Management and Medication Current Pain Management: Electronic Signature(s) Signed: 03/08/2018 4:18:49 PM By: Montey Hora Signed: 03/08/2018 4:31:59 PM By: Lorine Bears RCP, RRT, CHT Entered By: Lorine Bears on 03/08/2018 08:12:29 [name] ([id number]) -------------------------------------------------------------------------------- Patient/Caregiver Education Details Patient Name: TADASHI, BURKEL. Date of Service: 03/08/2018 8:15 AM Medical Record Number: [id number] Patient Account Number: [id number] Date of Birth/Gender: 06-01-1950 (68 y.o. M) Treating RN: Montey Hora Primary Care Physician: Benita Stabile Other Clinician: Referring Physician: Benita Stabile Treating Physician/Extender: Sharalyn Ink in Treatment: 4 Education Assessment Education Provided To: Patient Education Topics Provided Basic Hygiene: Handouts: Other: care of newly  healed ulcer site Methods: Explain/Verbal Responses: State content correctly Electronic Signature(s) Signed: 03/08/2018 4:18:49 PM By: Montey Hora Entered By: Montey Hora on 03/08/2018 08:50:41 [name], [name] ([id number]) -------------------------------------------------------------------------------- Wound Assessment Details Patient Name: [name]. Date of Service: 03/08/2018 8:15 AM Medical Record Number: [id number] Patient Account Number: [id number] Date of Birth/Sex: [birth year] (67 y.o. M) Treating RN: Montey Hora Primary Care Zenia Guest: Benita Stabile Other Clinician: Referring Shaniyah Wix: TATE, Sharlet Salina Treating Riot Barrick/Extender: STONE III, HOYT Weeks in Treatment: 4 Wound Status Wound Number: 3 Primary Diabetic Wound/Ulcer of the Lower Extremity Etiology: Wound Location: Right, Proximal, Plantar Foot Wound Status: Healed - Epithelialized Wounding Event: Footwear Injury Comorbid Arrhythmia, Hypertension, Type II Diabetes, Date Acquired: 01/25/2018 History: Osteoarthritis Weeks Of Treatment: 4 Clustered Wound: No Photos Photo Uploaded By: Harold Barban on 03/08/2018 08:35:52 Wound Measurements Length: (cm) 0 % R Width: (cm) 0 % R Depth: (cm) 0 Epi Area: (cm) 0 Tu Volume: (cm) 0 Un eduction in Area: 100% eduction in Volume: 100% thelialization: Medium (34-66%) nneling: No dermining: No Wound Description Classification: Grade 2 Wound Margin: Indistinct, nonvisible Exudate Amount: None Present Foul Odor After Cleansing: No Slough/Fibrino No Wound Bed Granulation Amount: None Present (0%) Exposed Structure Necrotic Amount:  Medium (34-66%) Fascia Exposed: No Necrotic Quality: Eschar Fat Layer (Subcutaneous Tissue) Exposed: Yes Tendon Exposed: No Muscle Exposed: No Joint Exposed: No Bone Exposed: No Periwound Skin Texture Texture Color No Abnormalities Noted: No No Abnormalities Noted: No Arington, Yussuf J. ([id number]) Callus: Yes Atrophie  Blanche: No Crepitus: No Cyanosis: No Excoriation: No Ecchymosis: No Induration: No Erythema: No Rash: No Hemosiderin Staining: No Scarring: No Mottled: No Pallor: No Moisture Rubor: No No Abnormalities Noted: No Dry / Scaly: No Temperature / Pain Maceration: No Temperature: No Abnormality Wound Preparation Ulcer Cleansing: Rinsed/Irrigated with Saline Topical Anesthetic Applied: Other: lidocaine 4%, Electronic Signature(s) Signed: 03/08/2018 4:18:49 PM By: Montey Hora Entered By: Montey Hora on 03/08/2018 08:35:16 Capp, [name] ([id number]) -------------------------------------------------------------------------------- Vitals Details Patient Name: [name]. Date of Service: 03/08/2018 8:15 AM Medical Record Number: [id number] Patient Account Number: [id number] Date of Birth/Sex: 10-11-1950 (67 y.o. M) Treating RN: Montey Hora Primary Care Colsen Modi: Benita Stabile Other Clinician: Referring Vaughn Beaumier: TATE, Sharlet Salina Treating Itzamara Casas/Extender: STONE III, HOYT Weeks in Treatment: 4 Vital Signs Time Taken: 08:12 Temperature (F): 97.6 Height (in): 74 Pulse (bpm): 82 Weight (lbs): 240.9 Respiratory Rate (breaths/min): 16 Body Mass Index (BMI): 30.9 Blood Pressure (mmHg): 144/72 Reference Range: 80 - 120 mg / dl Airway Electronic Signature(s) Signed: 03/08/2018 4:31:59 PM By: Lorine Bears RCP, RRT, CHT Entered By: Lorine Bears on 03/08/2018 08:15:12

## 2018-03-09 NOTE — Progress Notes (Signed)
BOND, GRIESHOP ([id number]) Visit Report for 03/08/2018 Chief Complaint Document Details Patient Name: [name] [name] [name] [name]. Date of Service: 03/08/2018 8:15 AM Medical Record Number: [id number] Patient Account Number: [id number] Date of Birth/Sex: Mar 11, 1950 (67 y.o. M) Treating RN: Montey Hora Primary Care Provider: Benita Stabile Other Clinician: Referring Provider: Benita Stabile Treating Provider/Extender: Melburn Hake, Kenly Henckel Weeks in Treatment: 4 Information Obtained from: Patient Chief Complaint Right plantar foot ulcer Electronic Signature(s) Signed: 03/08/2018 5:20:18 PM By: Worthy Keeler PA-C Entered By: Worthy Keeler on 03/08/2018 08:15:22 [name] [name] Primrose ([id number]) -------------------------------------------------------------------------------- HPI Details Patient Name: [name] [name]. Date of Service: 03/08/2018 8:15 AM Medical Record Number: [id number] Patient Account Number: [id number] Date of Birth/Sex: 09-14-1950 (67 y.o. M) Treating RN: Montey Hora Primary Care Provider: Benita Stabile Other Clinician: Referring Provider: Benita Stabile Treating Provider/Extender: STONE III, Koltin Wehmeyer Weeks in Treatment: 4 History of Present Illness Associated Signs and Symptoms: Patient has a history of diabetes mellitus type II, hypertension, and atrial fibrillation. He also has a hammertoe deformity of the bilateral feet which seems to be causing pressure in the ball of his foot. HPI Description: 03/09/17 on evaluation today patient presents for his initial evaluation concerning an ulcer on the plantar aspect of his right foot which has been open he tells me for about three weeks. He has been seen at [facility] where they did perform debridement it appears according to a note on 02/22/17 where unfortunately it appears that his callous area began to break down into an ulcer after modifications have been added to his insulin. This had happened previously as well. However I do not have details  of the severity of debridement at that point although it does not sound as if you the debridement was too significant based on what the patient is telling me that he still had a lot of callous following debridement. With that being said they were going to look into altering his insoles to try to prevent further breakdown in the future. Nonetheless he has had foul odor discharge coming from the ulcer which is noted all the way back to that visit [facility]. Patient states that this was concerning him more than anything else. He does have diabetes although he described this as "borderline diabetes" he is on metoprolol however along with lisinopril. Patient is not having any issues with pain in regard to his right plantar foot. 03/16/17 on evaluation today patient appears to be doing much better in regard to his plantar foot ulcer. He actually tells me that he loves the peg assist offloading shoe and that he hasn't had any pain in the ulcer area from the callous since I worked on it last week. Overall he is extremely happy with how things have progressed. Likewise the wound bed has no slough noted there is no evidence of infection and it looks excellent. I did receive the results of his hemoglobin A1c which showed a value of 5.9 which was elevated but actually rather well. Subsequently I did also receive the x-ray of his foot which showed diffuse degenerative change but no underlying acute bony abnormality. 03/23/17 on evaluation today patient appears to be doing better in regard to his right plantar foot ulcer. He continues to show signs of improvement there's definitely not as much drainage at this point. He has been tolerating the dressing changes without complication he is not happy with the peg assist offloading shoe but at the same time I do believe that it  is making good progress as far as offloading is concerned. I still believe he may need to talk to a surgeon about surgically  correcting a hammertoe in order to avoid additional pressure to the site especially since he Artie has diabetic shoes and they do not seem to have prevented callous buildup in ulcer formation. 03/30/17 on evaluation today patient appears to be doing better in regard to his right plantar foot ulcer. Continues to show signs of improvement which is good news. Unfortunately we were unable to get the appointment with the orthopedic, Dr. Doran Durand, whom I recommended for him due to patient having a balance at the practice. With that being said patient's foot ulcer does seem to be doing much better on evaluation today he still has some depth to the wound but overall we are seeing improvement and epithelialization week by week. Hopefully this is something that will close shortly. 04/05/17 on evaluation today patient unfortunately has what appears to be an area of the plantar surface of his ulcer where he had fluid collection where the callous grew over top of the wound bed. Unfortunately there was some pus like material noted during cleansing that we did sent for culture today. Hopefully this is not truly an infection is or does not appear to be any evidence of erythema surrounding and maybe this is just simply a small setback with a fluid collection that has caused this issue. Nonetheless we will see what that shows when we get the culture back. I am also going to have it completely antibiotic which I previously placed him on anyway which will hopefully prevent any true infection from setting in. 04/12/17 on evaluation today patient's ulcer on the plantar aspect of his right foot actually appears to be better then during last weeks evaluation. In general he has been tolerating the dressing changes in utilizing the offloading shoe which does seem to be beneficial for him. With that being said he still seems to get some pressure to the area just not nearly as much as he previously had noted. Again fortunately  there is no significant pain. [name] [name] ([id number]) 04/19/17 on evaluation today patient appears to be doing excellent in regard to his right foot ulcer. He has been tolerating the dressing changes without complication. There really has not been any drainage over the past few days he tells me. With that being said he seems to be doing excellent in my opinion at this point he does have some callous buildup. 04/26/17 On evaluation today patient's wound appears to be completely healed which is great news. He has been tolerating the dressing changes without complication and I do think he has done very well in regard to the healing process and offloading he has listed everything that that we instructed him to do. Obviously this appears to have paid off and he has progressed very nicely. Readmission: 07/05/17 on evaluation today patient is seen for fault evaluation and our clinic concerning the same issue that I have previously treated him for ending back in April 2019. This is a callous region with subsequently an ulcer underneath this on the plantar aspect of his right second metatarsal region. Fortunately he's not having any significant discomfort although his girlfriend told him that she noted an area of bruising at the site underneath the callous and wanted him to come get this checked out. He was very please with our care previous and therefore was more than happy to come let us check this for him.  Upon inspection initially he did have significant callous overlying the area in question. It was not easily identifiable as far as any ulcer was concerned. With that being said he with this significant callous did require some sharp debridement and following debridement there did appear to be a small ulcer underlying. Fortunately patient in general shows no signs of infection at this point. He again has neuropathy and therefore does not have any ongoing discomfort or pain at this point. 07/12/17 on  evaluation today patient actually appears to be doing very well in regard to his plantar foot ulcer. In fact this appears to be completely healed and there's no residual opening at this time. Overall I'm very pleased in this regard. Patient also is extremely pleased. Readmission: 02/08/18 patient presents today for follow-up evaluation he has previously been seen here in our office for the same issue. Fortunately there is not been any evidence of infection since I last saw him he does note that he is been going to see his local podiatrist for callous pairing. During that time they actually noted that he had an open wound and subsequently referred him back ties for evaluation of this wound. He has done very well in the past with good wound care often healing in just a few weeks. No fevers, chills, nausea, or vomiting noted at this time. 02/15/18 on evaluation today patient actually appears to be doing very well in regard to his plantar foot ulcer. He is been tolerating the dressing changes without complication. There does appear to be some eschar of the line the wound surface. Fortunately I am happy with how things are progressing from the standpoint of the wound progress from last week to this week. 02/22/18 on evaluation today patient appears to be doing very well in regard to his plantar foot ulcer. He did have callous buildup around the area that required some sharp debridement today. He tolerated the debridement without complication. Post debridement the wound bed appears to be doing much better there's just a very tiny opening still remaining. 03/01/18 on evaluation today patient appears to be doing excellent in regard to his plantar foot ulcer. In fact after I debrided away what appeared to be a dried blood clot on the surface and actually appear there was just a very small opening remaining in the central portion of the wound. This is literally a pinpoint region. Overall he has done well and I'm  very pleased in this regard. I think that likely one more week will see this area completely healed. 03/08/18 on evaluation today patient appears to actually be doing very well in regard to his plantar foot ulcer. He has been tolerating the dressing changes without complication. Fortunately this appears to be completely healed at this point which is excellent news. Overall I'm extremely happy with how things have progressed. Electronic Signature(s) Signed: 03/08/2018 5:20:18 PM By: Worthy Keeler PA-C Entered By: Worthy Keeler on 03/08/2018 08:42:51 [name] [name] Primrose ([id number]) -------------------------------------------------------------------------------- Physical Exam Details Patient Name: COLSON, BARCO. Date of Service: 03/08/2018 8:15 AM Medical Record Number: [id number] Patient Account Number: [id number] Date of Birth/Sex: [birth year] (67 y.o. M) Treating RN: Montey Hora Primary Care Provider: Benita Stabile Other Clinician: Referring Provider: TATE, Sharlet Salina Treating Provider/Extender: STONE III, Osaze Hubbert Weeks in Treatment: 4 Constitutional Well-nourished and well-hydrated in no acute distress. Respiratory normal breathing without difficulty. Psychiatric this patient is able to make decisions and demonstrates good insight into disease process. Alert and Oriented x 3. pleasant and cooperative. Notes  Patient's wound currently shows signs of excellent granulation in the past and today shows complete epithelialization. There really was no significant callous buildup which was also good news overall he has done excellent. Electronic Signature(s) Signed: 03/08/2018 5:20:18 PM By: Worthy Keeler PA-C Entered By: Worthy Keeler on 03/08/2018 08:43:20 [name] [name] Primrose ([id number]) -------------------------------------------------------------------------------- Physician Orders Details Patient Name: CECILIA, NISHIKAWA. Date of Service: 03/08/2018 8:15 AM Medical Record Number:  [id number] Patient Account Number: [id number] Date of Birth/Sex: 08/12/1950 (67 y.o. M) Treating RN: Montey Hora Primary Care Provider: Benita Stabile Other Clinician: Referring Provider: Benita Stabile Treating Provider/Extender: Melburn Hake, Adina Puzzo Weeks in Treatment: 4 Verbal / Phone Orders: No Diagnosis Coding ICD-10 Coding Code Description E11.621 Type 2 diabetes mellitus with foot ulcer L97.512 Non-pressure chronic ulcer of other part of right foot with fat layer exposed I10 Essential (primary) hypertension I48.20 Chronic atrial fibrillation, unspecified Discharge From [facility] o Discharge from [place] offloading with felt around the callus area Electronic Signature(s) Signed: 03/08/2018 4:18:49 PM By: Montey Hora Signed: 03/08/2018 5:20:18 PM By: Worthy Keeler PA-C Entered By: Montey Hora on 03/08/2018 08:35:58 [name] [name] Primrose ([id number]) -------------------------------------------------------------------------------- Problem List Details Patient Name: RAFIK, KOPPEL. Date of Service: 03/08/2018 8:15 AM Medical Record Number: [id number] Patient Account Number: [id number] Date of Birth/Sex: [birth year] (67 y.o. M) Treating RN: Montey Hora Primary Care Provider: Benita Stabile Other Clinician: Referring Provider: Benita Stabile Treating Provider/Extender: Melburn Hake, Devonne Kitchen Weeks in Treatment: 4 Active Problems ICD-10 Evaluated Encounter Code Description Active Date Today Diagnosis E11.621 Type 2 diabetes mellitus with foot ulcer 02/08/2018 No Yes L97.512 Non-pressure chronic ulcer of other part of right foot with fat 02/08/2018 No Yes layer exposed I10 Essential (primary) hypertension 02/08/2018 No Yes I48.20 Chronic atrial fibrillation, unspecified 02/08/2018 No Yes Inactive Problems Resolved Problems Electronic Signature(s) Signed: 03/08/2018 5:20:18 PM By: Worthy Keeler PA-C Entered By: Worthy Keeler on 03/08/2018 08:15:07 [name] [name] Primrose  ([id number]) -------------------------------------------------------------------------------- Progress Note Details Patient Name: [name] [name]. Date of Service: 03/08/2018 8:15 AM Medical Record Number: [id number] Patient Account Number: [id number] Date of Birth/Sex: [birth year] (67 y.o. M) Treating RN: Montey Hora Primary Care Provider: Benita Stabile Other Clinician: Referring Provider: Benita Stabile Treating Provider/Extender: Melburn Hake, Griffin Dewilde Weeks in Treatment: 4 Subjective Chief Complaint Information obtained from Patient Right plantar foot ulcer History of Present Illness (HPI) The following HPI elements were documented for the patient's wound: Associated Signs and Symptoms: Patient has a history of diabetes mellitus type II, hypertension, and atrial fibrillation. He also has a hammertoe deformity of the bilateral feet which seems to be causing pressure in the ball of his foot. 03/09/17 on evaluation today patient presents for his initial evaluation concerning an ulcer on the plantar aspect of his right foot which has been open he tells me for about three weeks. He has been seen at [facility] where they did perform debridement it appears according to a note on 02/22/17 where unfortunately it appears that his callous area began to break down into an ulcer after modifications have been added to his insulin. This had happened previously as well. However I do not have details of the severity of debridement at that point although it does not sound as if you the debridement was too significant based on what the patient is telling me that he still had a lot of callous following debridement. With that being said they were going to look into altering his insoles to try to prevent  further breakdown in the future. Nonetheless he has had foul odor discharge coming from the ulcer which is noted all the way back to that visit [facility]. Patient states that this was concerning  him more than anything else. He does have diabetes although he described this as "borderline diabetes" he is on metoprolol however along with lisinopril. Patient is not having any issues with pain in regard to his right plantar foot. 03/16/17 on evaluation today patient appears to be doing much better in regard to his plantar foot ulcer. He actually tells me that he loves the peg assist offloading shoe and that he hasn't had any pain in the ulcer area from the callous since I worked on it last week. Overall he is extremely happy with how things have progressed. Likewise the wound bed has no slough noted there is no evidence of infection and it looks excellent. I did receive the results of his hemoglobin A1c which showed a value of 5.9 which was elevated but actually rather well. Subsequently I did also receive the x-ray of his foot which showed diffuse degenerative change but no underlying acute bony abnormality. 03/23/17 on evaluation today patient appears to be doing better in regard to his right plantar foot ulcer. He continues to show signs of improvement there's definitely not as much drainage at this point. He has been tolerating the dressing changes without complication he is not happy with the peg assist offloading shoe but at the same time I do believe that it is making good progress as far as offloading is concerned. I still believe he may need to talk to a surgeon about surgically correcting a hammertoe in order to avoid additional pressure to the site especially since he Artie has diabetic shoes and they do not seem to have prevented callous buildup in ulcer formation. 03/30/17 on evaluation today patient appears to be doing better in regard to his right plantar foot ulcer. Continues to show signs of improvement which is good news. Unfortunately we were unable to get the appointment with the orthopedic, Dr. Doran Durand, whom I recommended for him due to patient having a balance at the practice.  With that being said patient's foot ulcer does seem to be doing much better on evaluation today he still has some depth to the wound but overall we are seeing improvement and epithelialization week by week. Hopefully this is something that will close shortly. 04/05/17 on evaluation today patient unfortunately has what appears to be an area of the plantar surface of his ulcer where he had fluid collection where the callous grew over top of the wound bed. Unfortunately there was some pus like material noted during cleansing that we did sent for culture today. Hopefully this is not truly an infection is or does not appear to be any GHALI, MORISSETTE. ([id number]) evidence of erythema surrounding and maybe this is just simply a small setback with a fluid collection that has caused this issue. Nonetheless we will see what that shows when we get the culture back. I am also going to have it completely antibiotic which I previously placed him on anyway which will hopefully prevent any true infection from setting in. 04/12/17 on evaluation today patient's ulcer on the plantar aspect of his right foot actually appears to be better then during last weeks evaluation. In general he has been tolerating the dressing changes in utilizing the offloading shoe which does seem to be beneficial for him. With that being said  he still seems to get some pressure to the area just not nearly as much as he previously had noted. Again fortunately there is no significant pain. 04/19/17 on evaluation today patient appears to be doing excellent in regard to his right foot ulcer. He has been tolerating the dressing changes without complication. There really has not been any drainage over the past few days he tells me. With that being said he seems to be doing excellent in my opinion at this point he does have some callous buildup. 04/26/17 On evaluation today patient's wound appears to be completely healed which is great news. He has been  tolerating the dressing changes without complication and I do think he has done very well in regard to the healing process and offloading he has listed everything that that we instructed him to do. Obviously this appears to have paid off and he has progressed very nicely. Readmission: 07/05/17 on evaluation today patient is seen for fault evaluation and our clinic concerning the same issue that I have previously treated him for ending back in April 2019. This is a callous region with subsequently an ulcer underneath this on the plantar aspect of his right second metatarsal region. Fortunately he's not having any significant discomfort although his girlfriend told him that she noted an area of bruising at the site underneath the callous and wanted him to come get this checked out. He was very please with our care previous and therefore was more than happy to come let us check this for him. Upon inspection initially he did have significant callous overlying the area in question. It was not easily identifiable as far as any ulcer was concerned. With that being said he with this significant callous did require some sharp debridement and following debridement there did appear to be a small ulcer underlying. Fortunately patient in general shows no signs of infection at this point. He again has neuropathy and therefore does not have any ongoing discomfort or pain at this point. 07/12/17 on evaluation today patient actually appears to be doing very well in regard to his plantar foot ulcer. In fact this appears to be completely healed and there's no residual opening at this time. Overall I'm very pleased in this regard. Patient also is extremely pleased. Readmission: 02/08/18 patient presents today for follow-up evaluation he has previously been seen here in our office for the same issue. Fortunately there is not been any evidence of infection since I last saw him he does note that he is been going to see his  local podiatrist for callous pairing. During that time they actually noted that he had an open wound and subsequently referred him back ties for evaluation of this wound. He has done very well in the past with good wound care often healing in just a few weeks. No fevers, chills, nausea, or vomiting noted at this time. 02/15/18 on evaluation today patient actually appears to be doing very well in regard to his plantar foot ulcer. He is been tolerating the dressing changes without complication. There does appear to be some eschar of the line the wound surface. Fortunately I am happy with how things are progressing from the standpoint of the wound progress from last week to this week. 02/22/18 on evaluation today patient appears to be doing very well in regard to his plantar foot ulcer. He did have callous buildup around the area that required some sharp debridement today. He tolerated the debridement without complication. Post debridement the wound bed  appears to be doing much better there's just a very tiny opening still remaining. 03/01/18 on evaluation today patient appears to be doing excellent in regard to his plantar foot ulcer. In fact after I debrided away what appeared to be a dried blood clot on the surface and actually appear there was just a very small opening remaining in the central portion of the wound. This is literally a pinpoint region. Overall he has done well and I'm very pleased in this regard. I think that likely one more week will see this area completely healed. 03/08/18 on evaluation today patient appears to actually be doing very well in regard to his plantar foot ulcer. He has been tolerating the dressing changes without complication. Fortunately this appears to be completely healed at this point which is excellent news. Overall I'm extremely happy with how things have progressed. TARANCE, BALAN ([id number]) Patient History Information obtained from Patient. Family  History Cancer - Siblings, Diabetes - Father, Heart Disease - Mother, Hypertension - Mother, No family history of Hereditary Spherocytosis, Kidney Disease, Lung Disease, Seizures, Stroke, Thyroid Problems, Tuberculosis. Social History Former smoker, Marital Status - Single, Alcohol Use - Never, Drug Use - No History, Caffeine Use - Moderate. Medical History Eyes Denies history of Cataracts, Glaucoma, Optic Neuritis Ear/Nose/Mouth/Throat Denies history of Chronic sinus problems/congestion, Middle ear problems Hematologic/Lymphatic Denies history of Anemia, Hemophilia, Human Immunodeficiency Virus, Lymphedema, Sickle Cell Disease Respiratory Denies history of Aspiration, Asthma, Chronic Obstructive Pulmonary Disease (COPD), Pneumothorax, Sleep Apnea, Tuberculosis Cardiovascular Patient has history of Arrhythmia - a fib, Hypertension Denies history of Angina, Congestive Heart Failure, Coronary Artery Disease, Deep Vein Thrombosis, Hypotension, Myocardial Infarction, Peripheral Arterial Disease, Peripheral Venous Disease, Phlebitis, Vasculitis Gastrointestinal Denies history of Cirrhosis , Colitis, Crohn s, Hepatitis A, Hepatitis B, Hepatitis C Endocrine Patient has history of Type II Diabetes Denies history of Type I Diabetes Genitourinary Denies history of End Stage Renal Disease Immunological Denies history of Lupus Erythematosus, Raynaud s, Scleroderma Integumentary (Skin) Denies history of History of Burn, History of pressure wounds Musculoskeletal Patient has history of Osteoarthritis Denies history of Gout, Rheumatoid Arthritis, Osteomyelitis Neurologic Denies history of Dementia, Neuropathy, Quadriplegia, Paraplegia, Seizure Disorder Oncologic Denies history of Received Chemotherapy, Received Radiation Medical And Surgical History Notes Cardiovascular High Cholesterol Review of Systems (ROS) Constitutional Symptoms (General Health) Denies complaints or symptoms of  Fever, Chills. Respiratory The patient has no complaints or symptoms. Cardiovascular The patient has no complaints or symptoms. Psychiatric The patient has no complaints or symptoms. SONG, GARRIS ([id number]) Objective Constitutional Well-nourished and well-hydrated in no acute distress. Vitals Time Taken: 8:12 AM, Height: 74 in, Weight: 240.9 lbs, BMI: 30.9, Temperature: 97.6 F, Pulse: 82 bpm, Respiratory Rate: 16 breaths/min, Blood Pressure: 144/72 mmHg. Respiratory normal breathing without difficulty. Psychiatric this patient is able to make decisions and demonstrates good insight into disease process. Alert and Oriented x 3. pleasant and cooperative. General Notes: Patient's wound currently shows signs of excellent granulation in the past and today shows complete epithelialization. There really was no significant callous buildup which was also good news overall he has done excellent. Integumentary (Hair, Skin) Wound #3 status is Healed - Epithelialized. Original cause of wound was Footwear Injury. The wound is located on the Right,Proximal,Plantar Foot. The wound measures 0cm length x 0cm width x 0cm depth; 0cm^2 area and 0cm^3 volume. There is Fat Layer (Subcutaneous Tissue) Exposed exposed. There is no tunneling or undermining noted. There is a none present amount of drainage noted. The wound margin  is indistinct and nonvisible. There is no granulation within the wound bed. There is a medium (34-66%) amount of necrotic tissue within the wound bed including Eschar. The periwound skin appearance exhibited: Callus. The periwound skin appearance did not exhibit: Crepitus, Excoriation, Induration, Rash, Scarring, Dry/Scaly, Maceration, Atrophie Blanche, Cyanosis, Ecchymosis, Hemosiderin Staining, Mottled, Pallor, Rubor, Erythema. Periwound temperature was noted as No Abnormality. Assessment Active Problems ICD-10 Type 2 diabetes mellitus with foot ulcer Non-pressure chronic  ulcer of other part of right foot with fat layer exposed Essential (primary) hypertension Chronic atrial fibrillation, unspecified Plan JONTHAN, LEITE ([id number]) Discharge From [facility] Services: Discharge from [place] offloading with felt around the callus area My suggestion currently is gonna be that we go ahead and continue with the above offloading measures. My suggestion was that he get orthopedic filter credit to donuts in order to put on the bottom of this foot and prevent this area from [place]. Patient states that that something he can definitely do. Also think it is the need to visit his podiatrist on a regular basis to prevent my cows from building up to significantly which in turn is what leads to him reopening as far as the one just insert. He's in agreement with that plan as well. If anything changes worsens meantime will contact the office and let me know. Electronic Signature(s) Signed: 03/08/2018 5:20:18 PM By: Worthy Keeler PA-C Entered By: Worthy Keeler on 03/08/2018 08:43:55 [name] [name] Primrose ([id number]) -------------------------------------------------------------------------------- ROS/PFSH Details Patient Name: RUTH, TULLY Date of Service: 03/08/2018 8:15 AM Medical Record Number: [id number] Patient Account Number: [id number] Date of Birth/Sex: 1950-08-03 (67 y.o. M) Treating RN: Montey Hora Primary Care Provider: Benita Stabile Other Clinician: Referring Provider: Benita Stabile Treating Provider/Extender: STONE III, Beya Tipps Weeks in Treatment: 4 Information Obtained From Patient Wound History Do you currently have one or more open woundso Yes How many open wounds do you currently haveo 1 Approximately how long have you had your woundso 2 weeks How have you been treating your wound(s) until nowo silvercell Has your wound(s) ever healed and then re-openedo Yes Have you had any lab work done in the past montho Yes Who ordered the lab work doneo  Benita Stabile PCP Have you tested positive for an antibiotic resistant organism (MRSA, VRE)o No Have you tested positive for osteomyelitis (bone infection)o No Have you had any tests for circulation on your legso Yes Constitutional Symptoms (General Health) Complaints and Symptoms: Negative for: Fever; Chills Eyes Medical History: Negative for: Cataracts; Glaucoma; Optic Neuritis Ear/Nose/Mouth/Throat Medical History: Negative for: Chronic sinus problems/congestion; Middle ear problems Hematologic/Lymphatic Medical History: Negative for: Anemia; Hemophilia; Human Immunodeficiency Virus; Lymphedema; Sickle Cell Disease Respiratory Complaints and Symptoms: No Complaints or Symptoms Medical History: Negative for: Aspiration; Asthma; Chronic Obstructive Pulmonary Disease (COPD); Pneumothorax; Sleep Apnea; Tuberculosis Cardiovascular Complaints and Symptoms: No Complaints or Symptoms Medical History: MOOSA, BUECHE ([id number]) Positive for: Arrhythmia - a fib; Hypertension Negative for: Angina; Congestive Heart Failure; Coronary Artery Disease; Deep Vein Thrombosis; Hypotension; Myocardial Infarction; Peripheral Arterial Disease; Peripheral Venous Disease; Phlebitis; Vasculitis Past Medical History Notes: High Cholesterol Gastrointestinal Medical History: Negative for: Cirrhosis ; Colitis; Crohnos; Hepatitis A; Hepatitis B; Hepatitis C Endocrine Medical History: Positive for: Type II Diabetes Negative for: Type I Diabetes Time with diabetes: 3 years Treated with: Oral agents Blood sugar tested every day: No Genitourinary Medical History: Negative for: End Stage Renal Disease Immunological Medical History: Negative for: Lupus Erythematosus; Raynaudos; Scleroderma Integumentary (Skin) Medical History: Negative  for: History of Burn; History of pressure wounds Musculoskeletal Medical History: Positive for: Osteoarthritis Negative for: Gout; Rheumatoid Arthritis;  Osteomyelitis Neurologic Medical History: Negative for: Dementia; Neuropathy; Quadriplegia; Paraplegia; Seizure Disorder Oncologic Medical History: Negative for: Received Chemotherapy; Received Radiation Psychiatric Complaints and Symptoms: No Complaints or Symptoms Immunizations Pneumococcal Vaccine: NASEEM, VARDEN ([id number]) Received Pneumococcal Vaccination: Yes Immunization Notes: up to date Implantable Devices Family and Social History Cancer: Yes - Siblings; Diabetes: Yes - Father; Heart Disease: Yes - Mother; Hereditary Spherocytosis: No; Hypertension: Yes - Mother; Kidney Disease: No; Lung Disease: No; Seizures: No; Stroke: No; Thyroid Problems: No; Tuberculosis: No; Former smoker; Marital Status - Single; Alcohol Use: Never; Drug Use: No History; Caffeine Use: Moderate; Financial Concerns: No; Food, Clothing or Shelter Needs: No; Support System Lacking: No; Transportation Concerns: No; Advanced Directives: No; Patient does not want information on Advanced Directives; Do not resuscitate: No; Living Will: No; Medical Power of Attorney: No Physician Affirmation I have reviewed and agree with the above information. Electronic Signature(s) Signed: 03/08/2018 4:18:49 PM By: Montey Hora Signed: 03/08/2018 5:20:18 PM By: Worthy Keeler PA-C Entered By: Worthy Keeler on 03/08/2018 08:43:06 [name] [name] Primrose ([id number]) -------------------------------------------------------------------------------- SuperBill Details Patient Name: EDIS, HUISH. Date of Service: 03/08/2018 Medical Record Number: [id number] Patient Account Number: [id number] Date of Birth/Sex: 17-Sep-1950 (68 y.o. M) Treating RN: Montey Hora Primary Care Provider: Benita Stabile Other Clinician: Referring Provider: Benita Stabile Treating Provider/Extender: Melburn Hake, Tighe Gitto Weeks in Treatment: 4 Diagnosis Coding ICD-10 Codes Code Description E11.621 Type 2 diabetes mellitus with foot ulcer L97.512  Non-pressure chronic ulcer of other part of right foot with fat layer exposed I10 Essential (primary) hypertension I48.20 Chronic atrial fibrillation, unspecified Facility Procedures CPT4 Code: [id number] Description: 99213 - WOUND CARE VISIT-LEV 3 EST PT Modifier: Quantity: 1 Physician Procedures CPT4 Code: 8251898 Description: 42103 - WC PHYS LEVEL 3 - EST PT ICD-10 Diagnosis Description E11.621 Type 2 diabetes mellitus with foot ulcer L97.512 Non-pressure chronic ulcer of other part of right foot with fa I10 Essential (primary) hypertension I48.20 Chronic atrial  fibrillation, unspecified Modifier: t layer exposed Quantity: 1 Electronic Signature(s) Signed: 03/08/2018 4:18:49 PM By: Montey Hora Signed: 03/08/2018 5:20:18 PM By: Worthy Keeler PA-C Entered By: Montey Hora on 03/08/2018 08:50:10

## 2018-03-21 ENCOUNTER — Encounter: Payer: Self-pay | Admitting: Podiatry

## 2018-03-21 ENCOUNTER — Ambulatory Visit: Payer: PPO | Admitting: Podiatry

## 2018-03-21 DIAGNOSIS — M79676 Pain in unspecified toe(s): Secondary | ICD-10-CM

## 2018-03-21 DIAGNOSIS — E11621 Type 2 diabetes mellitus with foot ulcer: Secondary | ICD-10-CM | POA: Diagnosis not present

## 2018-03-21 DIAGNOSIS — L84 Corns and callosities: Secondary | ICD-10-CM

## 2018-03-21 DIAGNOSIS — L97519 Non-pressure chronic ulcer of other part of right foot with unspecified severity: Secondary | ICD-10-CM

## 2018-03-21 DIAGNOSIS — B351 Tinea unguium: Secondary | ICD-10-CM

## 2018-03-21 DIAGNOSIS — M2041 Other hammer toe(s) (acquired), right foot: Secondary | ICD-10-CM

## 2018-03-21 NOTE — Progress Notes (Signed)
This patient presents the office for continued evaluation of a diabetic ulcer on his right forefoot.  He says the diabetic ulcer has completely healed and he is not having any pain or discomfort or drainage from the ulcer site.  He says he has thick disfigured discolored toenails on both feet, which are painful walking and wearing his shoes.  He presents the office today for an evaluation of the ulcer and treatment of his long thick nails.  This patient is diabetic and has been treated with diabetic shoes which has helped    GENERAL APPEARANCE: Alert, conversant. Appropriately groomed. No acute distress.  VASCULAR: Pedal pulses are  palpable at  [facility] and PT bilateral.  Capillary refill time is immediate to all digits,  Normal temperature gradient.   NEUROLOGIC: sensation is diminished  to 5.07 monofilament at 5/5 sites bilateral.  Light touch is intact bilateral, Muscle strength normal.  MUSCULOSKELETAL: acceptable muscle strength, tone and stability bilateral.  Hammer toe second right.  Hallux malleus right foot. Plantar flex second metatarsal right foot with mallet toe 2 right. NAILS  Thick disfigured discolored nails both feet. DERMATOLOGIC: skin color, texture, and turgor are within normal limits.    No open lesions present.   S/P ulcer sub 2 right foot.   Healed diabetic ulcer.  Onychomycosis  B/L  Debride nails  RTC 9 weeks for continued preventative foot care services. Dispensed diabetic insole for use.  Patient to make an appointment with Liliane Channel for new diabetic shoes.   Gardiner Barefoot DPM

## 2018-04-06 ENCOUNTER — Ambulatory Visit: Payer: PPO | Admitting: Orthotics

## 2018-04-06 ENCOUNTER — Other Ambulatory Visit: Payer: Self-pay

## 2018-04-06 DIAGNOSIS — M2041 Other hammer toe(s) (acquired), right foot: Secondary | ICD-10-CM

## 2018-04-06 DIAGNOSIS — E11621 Type 2 diabetes mellitus with foot ulcer: Secondary | ICD-10-CM

## 2018-04-06 DIAGNOSIS — L97519 Non-pressure chronic ulcer of other part of right foot with unspecified severity: Secondary | ICD-10-CM

## 2018-04-06 DIAGNOSIS — M79676 Pain in unspecified toe(s): Principal | ICD-10-CM

## 2018-04-06 DIAGNOSIS — L84 Corns and callosities: Secondary | ICD-10-CM

## 2018-04-06 DIAGNOSIS — B351 Tinea unguium: Secondary | ICD-10-CM

## 2018-04-06 NOTE — Progress Notes (Signed)
Patient presents today with hx of DM2, hx of diabetic ulceration, and HT b/l.  Patient wants diabetic inserts only as he say shoes are still in good shape.

## 2018-04-22 DIAGNOSIS — E0842 Diabetes mellitus due to underlying condition with diabetic polyneuropathy: Secondary | ICD-10-CM | POA: Diagnosis not present

## 2018-04-22 DIAGNOSIS — M2040 Other hammer toe(s) (acquired), unspecified foot: Secondary | ICD-10-CM | POA: Diagnosis not present

## 2018-04-26 DIAGNOSIS — I361 Nonrheumatic tricuspid (valve) insufficiency: Secondary | ICD-10-CM | POA: Insufficient documentation

## 2018-04-26 DIAGNOSIS — I340 Nonrheumatic mitral (valve) insufficiency: Secondary | ICD-10-CM | POA: Diagnosis not present

## 2018-04-26 DIAGNOSIS — I428 Other cardiomyopathies: Secondary | ICD-10-CM | POA: Diagnosis not present

## 2018-04-26 DIAGNOSIS — I480 Paroxysmal atrial fibrillation: Secondary | ICD-10-CM | POA: Diagnosis not present

## 2018-04-26 DIAGNOSIS — I5022 Chronic systolic (congestive) heart failure: Secondary | ICD-10-CM | POA: Diagnosis not present

## 2018-04-26 DIAGNOSIS — I714 Abdominal aortic aneurysm, without rupture: Secondary | ICD-10-CM | POA: Diagnosis not present

## 2018-04-26 DIAGNOSIS — Z79899 Other long term (current) drug therapy: Secondary | ICD-10-CM | POA: Diagnosis not present

## 2018-05-05 DIAGNOSIS — I480 Paroxysmal atrial fibrillation: Secondary | ICD-10-CM | POA: Diagnosis not present

## 2018-05-05 DIAGNOSIS — I5022 Chronic systolic (congestive) heart failure: Secondary | ICD-10-CM | POA: Diagnosis not present

## 2018-05-05 DIAGNOSIS — I361 Nonrheumatic tricuspid (valve) insufficiency: Secondary | ICD-10-CM | POA: Diagnosis not present

## 2018-05-05 DIAGNOSIS — I428 Other cardiomyopathies: Secondary | ICD-10-CM | POA: Diagnosis not present

## 2018-05-05 DIAGNOSIS — I340 Nonrheumatic mitral (valve) insufficiency: Secondary | ICD-10-CM | POA: Diagnosis not present

## 2018-05-11 DIAGNOSIS — I480 Paroxysmal atrial fibrillation: Secondary | ICD-10-CM | POA: Diagnosis not present

## 2018-05-11 DIAGNOSIS — I361 Nonrheumatic tricuspid (valve) insufficiency: Secondary | ICD-10-CM | POA: Diagnosis not present

## 2018-05-11 DIAGNOSIS — I519 Heart disease, unspecified: Secondary | ICD-10-CM | POA: Diagnosis not present

## 2018-05-11 DIAGNOSIS — I428 Other cardiomyopathies: Secondary | ICD-10-CM | POA: Diagnosis not present

## 2018-05-11 DIAGNOSIS — I340 Nonrheumatic mitral (valve) insufficiency: Secondary | ICD-10-CM | POA: Diagnosis not present

## 2018-05-11 DIAGNOSIS — I5022 Chronic systolic (congestive) heart failure: Secondary | ICD-10-CM | POA: Diagnosis not present

## 2018-05-23 ENCOUNTER — Encounter: Payer: Self-pay | Admitting: Podiatry

## 2018-05-23 ENCOUNTER — Ambulatory Visit: Payer: PPO | Admitting: Podiatry

## 2018-05-23 ENCOUNTER — Other Ambulatory Visit: Payer: Self-pay

## 2018-05-23 VITALS — Temp 98.0°F

## 2018-05-23 DIAGNOSIS — L84 Corns and callosities: Secondary | ICD-10-CM

## 2018-05-23 DIAGNOSIS — L97512 Non-pressure chronic ulcer of other part of right foot with fat layer exposed: Secondary | ICD-10-CM | POA: Diagnosis not present

## 2018-05-23 DIAGNOSIS — E11621 Type 2 diabetes mellitus with foot ulcer: Secondary | ICD-10-CM

## 2018-05-23 DIAGNOSIS — L97519 Non-pressure chronic ulcer of other part of right foot with unspecified severity: Secondary | ICD-10-CM

## 2018-05-23 NOTE — Progress Notes (Signed)
This patient presents the office with chief complaint of painful calluses on the ball of right  feet.  He says the callus has been thickening over time but there is no drainage noted.  He says his girlfriend states his callus has been thickening the last few weeks  and suggested he present to this office for evaluation of  callus.  Patient states that he desires to receive diabetic insoles to help prevent future skin breakdowns.  General Appearance  Alert, conversant and in no acute stress.  Vascular  Dorsalis pedis and posterior tibial  pulses are palpable  bilaterally.  Capillary return is within normal limits  bilaterally. Temperature is within normal limits  bilaterally.  Neurologic  Senn-Weinstein monofilament wire test diminished  bilaterally. Muscle power within normal limits bilaterally.  Nails Thick disfigured discolored nails with subungual debris  from hallux to fifth toes bilaterally. No evidence of bacterial infection or drainage bilaterally.  Orthopedic  No limitations of motion  feet .  No crepitus or effusions noted.  No bony pathology or digital deformities noted. Plantarflexed second metatarsal both feet.  Skin  normotropic skin with no porokeratosis noted bilaterally.  No signs of infections or ulcers noted. Thickened disfigured discolored skin callus noted sub 2 right.  Pre-ulcerous callus  Sub 2  Right .  Debridement of pre-ulcerous callus  Patient has developed a dried black post blister deformity distal to the callus sub 2.  No redness or drainage or drainage noted.  Neosporin/DSD .  Told to return in 9 weeks.  He is to receive diabetic insoles in the near future.   Gardiner Barefoot DPM

## 2018-05-25 ENCOUNTER — Ambulatory Visit (INDEPENDENT_AMBULATORY_CARE_PROVIDER_SITE_OTHER): Payer: PPO | Admitting: Orthotics

## 2018-05-25 ENCOUNTER — Other Ambulatory Visit: Payer: Self-pay

## 2018-05-25 DIAGNOSIS — M2041 Other hammer toe(s) (acquired), right foot: Secondary | ICD-10-CM

## 2018-05-25 DIAGNOSIS — E11621 Type 2 diabetes mellitus with foot ulcer: Secondary | ICD-10-CM

## 2018-05-25 DIAGNOSIS — L84 Corns and callosities: Secondary | ICD-10-CM

## 2018-05-25 DIAGNOSIS — L97519 Non-pressure chronic ulcer of other part of right foot with unspecified severity: Secondary | ICD-10-CM | POA: Diagnosis not present

## 2018-05-25 DIAGNOSIS — Q828 Other specified congenital malformations of skin: Secondary | ICD-10-CM

## 2018-05-25 DIAGNOSIS — M2042 Other hammer toe(s) (acquired), left foot: Secondary | ICD-10-CM | POA: Diagnosis not present

## 2018-05-25 DIAGNOSIS — E1142 Type 2 diabetes mellitus with diabetic polyneuropathy: Secondary | ICD-10-CM

## 2018-05-25 NOTE — Progress Notes (Signed)
Patient came in today to pick up and custom inserts.  Same was well pleased with fit and function.   The patient could ambulate without any discomfort; there were no signs of any quality issues. The foot ortheses offered full contact with plantar surface and contoured the arch well.   The shoes fit well with no heel slippage and areas of pressure concern.   Patient advised to contact us if any problems arise.  Patient also advised on how to report any issues.

## 2018-06-21 ENCOUNTER — Encounter: Payer: PPO | Attending: Physician Assistant | Admitting: Physician Assistant

## 2018-06-21 ENCOUNTER — Other Ambulatory Visit: Payer: Self-pay

## 2018-06-21 DIAGNOSIS — E11621 Type 2 diabetes mellitus with foot ulcer: Secondary | ICD-10-CM | POA: Insufficient documentation

## 2018-06-21 DIAGNOSIS — Z87891 Personal history of nicotine dependence: Secondary | ICD-10-CM | POA: Insufficient documentation

## 2018-06-21 DIAGNOSIS — I482 Chronic atrial fibrillation, unspecified: Secondary | ICD-10-CM | POA: Insufficient documentation

## 2018-06-21 DIAGNOSIS — I10 Essential (primary) hypertension: Secondary | ICD-10-CM | POA: Diagnosis not present

## 2018-06-21 DIAGNOSIS — L97512 Non-pressure chronic ulcer of other part of right foot with fat layer exposed: Secondary | ICD-10-CM | POA: Insufficient documentation

## 2018-06-21 DIAGNOSIS — L84 Corns and callosities: Secondary | ICD-10-CM | POA: Diagnosis not present

## 2018-06-22 NOTE — Progress Notes (Signed)
CABELL, LAZENBY ([id number]) Visit Report for 06/21/2018 Chief Complaint Document Details Patient Name: [name], [name]. Date of Service: 06/21/2018 10:45 AM Medical Record Number: [id number] Patient Account Number: [id number] Date of Birth/Sex: 1950-11-04 (68 y.o. M) Treating RN: Cornell Barman Primary Care Provider: Benita Stabile Other Clinician: Referring Provider: Benita Stabile Treating Provider/Extender: Melburn Hake, Jaqlyn Gruenhagen Weeks in Treatment: 0 Information Obtained from: Patient Chief Complaint Right plantar foot ulcer Electronic Signature(s) Signed: 06/22/2018 2:50:08 AM By: Worthy Keeler PA-C Entered By: Worthy Keeler on 06/21/2018 11:46:13 Pontiff, [name] ([id number]) -------------------------------------------------------------------------------- Debridement Details Patient Name: [name]. Date of Service: 06/21/2018 10:45 AM Medical Record Number: [id number] Patient Account Number: [id number] Date of Birth/Sex: [birth year] (68 y.o. M) Treating RN: Cornell Barman Primary Care Provider: Benita Stabile Other Clinician: Referring Provider: Benita Stabile Treating Provider/Extender: Melburn Hake, Kaliopi Blyden Weeks in Treatment: 0 Debridement Performed for Wound #4 Right,Plantar Foot Assessment: Performed By: Physician STONE III, Bentley Haralson E., PA-C Debridement Type: Debridement Severity of Tissue Pre Fat layer exposed Debridement: Level of Consciousness (Pre- Awake and Alert procedure): Pre-procedure Verification/Time Yes - 11:25 Out Taken: Start Time: 11:25 Pain Control: Lidocaine Total Area Debrided (L x W): 3 (cm) x 2 (cm) = 6 (cm) Tissue and other material Viable, Non-Viable, Callus, Slough, Subcutaneous, Slough debrided: Level: Skin/Subcutaneous Tissue Debridement Description: Excisional Instrument: Curette Bleeding: Large Hemostasis Achieved: Silver Nitrate End Time: 11:34 Response to Treatment: Procedure was tolerated well Level of Consciousness Awake and  Alert (Post-procedure): Post Debridement Measurements of Total Wound Length: (cm) 3 Width: (cm) 2 Depth: (cm) 1.2 Volume: (cm) 5.655 Character of Wound/Ulcer Post Debridement: Requires Further Debridement Severity of Tissue Post Debridement: Fat layer exposed Post Procedure Diagnosis Same as Pre-procedure Electronic Signature(s) Signed: 06/21/2018 5:54:26 PM By: Gretta Cool, BSN, RN, CWS, Kim RN, BSN Signed: 06/22/2018 2:50:08 AM By: Worthy Keeler PA-C Entered By: Gretta Cool, BSN, RN, CWS, Kim on 06/21/2018 11:50:14 [name], [name] ([id number]) -------------------------------------------------------------------------------- HPI Details Patient Name: [name], [name]. Date of Service: 06/21/2018 10:45 AM Medical Record Number: [id number] Patient Account Number: [id number] Date of Birth/Sex: 02/18/1950 (68 y.o. M) Treating RN: Cornell Barman Primary Care Provider: Benita Stabile Other Clinician: Referring Provider: Benita Stabile Treating Provider/Extender: Melburn Hake, Rashawn Rolon Weeks in Treatment: 0 History of Present Illness Associated Signs and Symptoms: Patient has a history of diabetes mellitus type II, hypertension, and atrial fibrillation. He also has a hammertoe deformity of the bilateral feet which seems to be causing pressure in the ball of his foot. HPI Description: 03/09/17 on evaluation today patient presents for his initial evaluation concerning an ulcer on the plantar aspect of his right foot which has been open he tells me for about three weeks. He has been seen at [facility] where they did perform debridement it appears according to a note on 02/22/17 where unfortunately it appears that his callous area began to break down into an ulcer after modifications have been added to his insulin. This had happened previously as well. However I do not have details of the severity of debridement at that point although it does not sound as if you the debridement was too significant based on what the  patient is telling me that he still had a lot of callous following debridement. With that being said they were going to look into altering his insoles to try to prevent further breakdown in the future. Nonetheless he has had foul odor discharge coming from the ulcer which is noted all the way back to that visit on  02/22/17 [facility]. Patient states that this was concerning him more than anything else. He does have diabetes although he described this as "borderline diabetes" he is on metoprolol however along with lisinopril. Patient is not having any issues with pain in regard to his right plantar foot. 03/16/17 on evaluation today patient appears to be doing much better in regard to his plantar foot ulcer. He actually tells me that he loves the peg assist offloading shoe and that he hasn't had any pain in the ulcer area from the callous since I worked on it last week. Overall he is extremely happy with how things have progressed. Likewise the wound bed has no slough noted there is no evidence of infection and it looks excellent. I did receive the results of his hemoglobin A1c which showed a value of 5.9 which was elevated but actually rather well. Subsequently I did also receive the x-ray of his foot which showed diffuse degenerative change but no underlying acute bony abnormality. 03/23/17 on evaluation today patient appears to be doing better in regard to his right plantar foot ulcer. He continues to show signs of improvement there's definitely not as much drainage at this point. He has been tolerating the dressing changes without complication he is not happy with the peg assist offloading shoe but at the same time I do believe that it is making good progress as far as offloading is concerned. I still believe he may need to talk to a surgeon about surgically correcting a hammertoe in order to avoid additional pressure to the site especially since he Artie has diabetic shoes and they do not  seem to have prevented callous buildup in ulcer formation. 03/30/17 on evaluation today patient appears to be doing better in regard to his right plantar foot ulcer. Continues to show signs of improvement which is good news. Unfortunately we were unable to get the appointment with the orthopedic, Dr. Doran Durand, whom I recommended for him due to patient having a balance at the practice. With that being said patient's foot ulcer does seem to be doing much better on evaluation today he still has some depth to the wound but overall we are seeing improvement and epithelialization week by week. Hopefully this is something that will close shortly. 04/05/17 on evaluation today patient unfortunately has what appears to be an area of the plantar surface of his ulcer where he had fluid collection where the callous grew over top of the wound bed. Unfortunately there was some pus like material noted during cleansing that we did sent for culture today. Hopefully this is not truly an infection is or does not appear to be any evidence of erythema surrounding and maybe this is just simply a small setback with a fluid collection that has caused this issue. Nonetheless we will see what that shows when we get the culture back. I am also going to have it completely antibiotic which I previously placed him on anyway which will hopefully prevent any true infection from setting in. 04/12/17 on evaluation today patient's ulcer on the plantar aspect of his right foot actually appears to be better then during last weeks evaluation. In general he has been tolerating the dressing changes in utilizing the offloading shoe which does seem to be beneficial for him. With that being said he still seems to get some pressure to the area just not nearly as much as he previously had noted. Again fortunately there is no significant pain. [name], [name] ([id number]) 04/19/17  on evaluation today patient appears to be doing excellent in regard to  his right foot ulcer. He has been tolerating the dressing changes without complication. There really has not been any drainage over the past few days he tells me. With that being said he seems to be doing excellent in my opinion at this point he does have some callous buildup. 04/26/17 On evaluation today patient's wound appears to be completely healed which is great news. He has been tolerating the dressing changes without complication and I do think he has done very well in regard to the healing process and offloading he has listed everything that that we instructed him to do. Obviously this appears to have paid off and he has progressed very nicely. Readmission: 07/05/17 on evaluation today patient is seen for fault evaluation and our clinic concerning the same issue that I have previously treated him for ending back in April 2019. This is a callous region with subsequently an ulcer underneath this on the plantar aspect of his right second metatarsal region. Fortunately he's not having any significant discomfort although his girlfriend told him that she noted an area of bruising at the site underneath the callous and wanted him to come get this checked out. He was very please with our care previous and therefore was more than happy to come let us check this for him. Upon inspection initially he did have significant callous overlying the area in question. It was not easily identifiable as far as any ulcer was concerned. With that being said he with this significant callous did require some sharp debridement and following debridement there did appear to be a small ulcer underlying. Fortunately patient in general shows no signs of infection at this point. He again has neuropathy and therefore does not have any ongoing discomfort or pain at this point. 07/12/17 on evaluation today patient actually appears to be doing very well in regard to his plantar foot ulcer. In fact this appears to be completely  healed and there's no residual opening at this time. Overall I'm very pleased in this regard. Patient also is extremely pleased. Readmission: 02/08/18 patient presents today for follow-up evaluation he has previously been seen here in our office for the same issue. Fortunately there is not been any evidence of infection since I last saw him he does note that he is been going to see his local podiatrist for callous pairing. During that time they actually noted that he had an open wound and subsequently referred him back ties for evaluation of this wound. He has done very well in the past with good wound care often healing in just a few weeks. No fevers, chills, nausea, or vomiting noted at this time. 02/15/18 on evaluation today patient actually appears to be doing very well in regard to his plantar foot ulcer. He is been tolerating the dressing changes without complication. There does appear to be some eschar of the line the wound surface. Fortunately I am happy with how things are progressing from the standpoint of the wound progress from last week to this week. 02/22/18 on evaluation today patient appears to be doing very well in regard to his plantar foot ulcer. He did have callous buildup around the area that required some sharp debridement today. He tolerated the debridement without complication. Post debridement the wound bed appears to be doing much better there's just a very tiny opening still remaining. 03/01/18 on evaluation today patient appears to be doing excellent in regard to his  plantar foot ulcer. In fact after I debrided away what appeared to be a dried blood clot on the surface and actually appear there was just a very small opening remaining in the central portion of the wound. This is literally a pinpoint region. Overall he has done well and I'm very pleased in this regard. I think that likely one more week will see this area completely healed. 03/08/18 on evaluation today patient  appears to actually be doing very well in regard to his plantar foot ulcer. He has been tolerating the dressing changes without complication. Fortunately this appears to be completely healed at this point which is excellent news. Overall I'm extremely happy with how things have progressed. 06/21/18 on evaluation today patient presents for evaluation in our clinic concerning issues that he's having with you seeing the area of his right foot that is previously experienced issues with. Fortunately there does not appear to be any evidence of infection at this time which is good news. With that being said he tells me that he has noted the callous buildup of the past several months but it was only in the past roughly week that he has noted drainage from the callous. He's not having any significant discomfort other than the fact that he states walking on the area with the extensive callous seems to be an issue. Electronic Signature(s) OSKER, AYOUB ([id number]) Signed: 06/22/2018 2:50:08 AM By: Worthy Keeler PA-C Entered By: Worthy Keeler on 06/22/2018 01:20:25 JLYN, BRACAMONTE ([id number]) -------------------------------------------------------------------------------- Physical Exam Details Patient Name: [name], [name]. Date of Service: 06/21/2018 10:45 AM Medical Record Number: [id number] Patient Account Number: [id number] Date of Birth/Sex: January 07, 1951 (68 y.o. M) Treating RN: Cornell Barman Primary Care Provider: Benita Stabile Other Clinician: Referring Provider: Benita Stabile Treating Provider/Extender: STONE III, Lyncoln Maskell Weeks in Treatment: 0 Constitutional sitting or standing blood pressure is within target range for patient.. pulse regular and within target range for patient.[name] respirations regular, non-labored and within target range for patient.[name] temperature within target range for patient.. Well- nourished and well-hydrated in no acute distress. Eyes conjunctiva clear no eyelid edema noted. pupils  equal round and reactive to light and accommodation. Ears, Nose, Mouth, and Throat no gross abnormality of ear auricles or external auditory canals. normal hearing noted during conversation. mucus membranes moist. Respiratory normal breathing without difficulty. clear to auscultation bilaterally. Cardiovascular regular rate and rhythm with normal S1, S2. 2+ dorsalis pedis/posterior tibialis pulses. no clubbing, cyanosis, significant edema, <3 sec cap refill. Gastrointestinal (GI) soft, non-tender, non-distended, +BS. no ventral hernia noted. Musculoskeletal normal gait and posture. no significant deformity or arthritic changes, no loss or range of motion, no clubbing. Psychiatric this patient is able to make decisions and demonstrates good insight into disease process. Alert and Oriented x 3. pleasant and cooperative. Notes Upon inspection today patient's wound bed actually showed signs of significant callous buildup which did require sharp debridement today. I was able to debride down to good subcutaneous tissue clearing away a significant portion of callous today. Unfortunately he did have some bleeding post debridement which has been the case in the past when I've taken care of this wound area for him as well. With that being said I was able to get the bleeding stopped with the use of silver nitrate today. Overall getting this callous off I think is gonna allow this area to heal quite well. I do believe in offloading shoe would be appropriate for him he tells me that he actually does still  have the previous shoe that we've given him that is functional and he will start to use this when he gets home today. Electronic Signature(s) Signed: 06/22/2018 2:50:08 AM By: Worthy Keeler PA-C Entered By: Worthy Keeler on 06/22/2018 01:22:50 Kappes, [name] ([id number]) -------------------------------------------------------------------------------- Physician Orders Details Patient Name:  [name], [name]. Date of Service: 06/21/2018 10:45 AM Medical Record Number: [id number] Patient Account Number: [id number] Date of Birth/Sex: 21-Sep-1950 (68 y.o. M) Treating RN: Cornell Barman Primary Care Provider: Benita Stabile Other Clinician: Referring Provider: Benita Stabile Treating Provider/Extender: STONE III, Chrissy Ealey Weeks in Treatment: 0 Verbal / Phone Orders: No Diagnosis Coding Wound Cleansing Wound #4 Right,Plantar Foot o Clean wound with Normal Saline. Anesthetic (add to Medication List) Wound #4 Right,Plantar Foot o Topical Lidocaine 4% cream applied to wound bed prior to debridement (In Clinic Only). Primary Wound Dressing Wound #4 Right,Plantar Foot o Silver Alginate Secondary Dressing Wound #4 Right,Plantar Foot o Gauze and Kerlix/Conform Dressing Change Frequency Wound #4 Right,Plantar Foot o Change dressing every other day. Follow-up Appointments Wound #4 Right,Plantar Foot o Return Appointment in 1 week. Edema Control Wound #4 Right,Plantar Foot o Elevate legs to the level of the heart and pump ankles as often as possible Off-Loading Wound #4 Right,Plantar Foot o Open toe surgical shoe with peg assist. - right Additional Orders / Instructions Wound #4 Right,Plantar Foot o Increase protein intake. Electronic Signature(s) Signed: 06/21/2018 5:54:26 PM By: Gretta Cool, BSN, RN, CWS, Kim RN, BSN Signed: 06/22/2018 2:50:08 AM By: Peter Garter ([id number]) Entered By: Gretta Cool BSN, RN, CWS, Kim on 06/21/2018 11:43:54 Broberg, [name] ([id number]) -------------------------------------------------------------------------------- Problem List Details Patient Name: [name], [name]. Date of Service: 06/21/2018 10:45 AM Medical Record Number: [id number] Patient Account Number: [id number] Date of Birth/Sex: 25-Jul-1950 (68 y.o. M) Treating RN: Cornell Barman Primary Care Provider: Benita Stabile Other Clinician: Referring Provider: Benita Stabile Treating  Provider/Extender: Melburn Hake, Jahara Dail Weeks in Treatment: 0 Active Problems ICD-10 Evaluated Encounter Code Description Active Date Today Diagnosis E11.621 Type 2 diabetes mellitus with foot ulcer 06/21/2018 No Yes L97.512 Non-pressure chronic ulcer of other part of right foot with fat 06/21/2018 No Yes layer exposed I10 Essential (primary) hypertension 06/21/2018 No Yes I48.20 Chronic atrial fibrillation, unspecified 06/21/2018 No Yes Inactive Problems Resolved Problems Electronic Signature(s) Signed: 06/22/2018 2:50:08 AM By: Worthy Keeler PA-C Entered By: Worthy Keeler on 06/21/2018 11:46:00 Reels, [name] ([id number]) -------------------------------------------------------------------------------- Progress Note Details Patient Name: [name]. Date of Service: 06/21/2018 10:45 AM Medical Record Number: [id number] Patient Account Number: [id number] Date of Birth/Sex: 12/15/1950 (68 y.o. M) Treating RN: Cornell Barman Primary Care Provider: Benita Stabile Other Clinician: Referring Provider: Benita Stabile Treating Provider/Extender: Melburn Hake, Phyllis Abelson Weeks in Treatment: 0 Subjective Chief Complaint Information obtained from Patient Right plantar foot ulcer History of Present Illness (HPI) The following HPI elements were documented for the patient's wound: Associated Signs and Symptoms: Patient has a history of diabetes mellitus type II, hypertension, and atrial fibrillation. He also has a hammertoe deformity of the bilateral feet which seems to be causing pressure in the ball of his foot. 03/09/17 on evaluation today patient presents for his initial evaluation concerning an ulcer on the plantar aspect of his right foot which has been open he tells me for about three weeks. He has been seen at [facility] where they did perform debridement it appears according to a note on 02/22/17 where unfortunately it appears that his callous area began to break down into  an ulcer after modifications  have been added to his insulin. This had happened previously as well. However I do not have details of the severity of debridement at that point although it does not sound as if you the debridement was too significant based on what the patient is telling me that he still had a lot of callous following debridement. With that being said they were going to look into altering his insoles to try to prevent further breakdown in the future. Nonetheless he has had foul odor discharge coming from the ulcer which is noted all the way back to that visit on 02/22/[facility]. Patient states that this was concerning him more than anything else. He does have diabetes although he described this as "borderline diabetes" he is on metoprolol however along with lisinopril. Patient is not having any issues with pain in regard to his right plantar foot. 03/16/17 on evaluation today patient appears to be doing much better in regard to his plantar foot ulcer. He actually tells me that he loves the peg assist offloading shoe and that he hasn't had any pain in the ulcer area from the callous since I worked on it last week. Overall he is extremely happy with how things have progressed. Likewise the wound bed has no slough noted there is no evidence of infection and it looks excellent. I did receive the results of his hemoglobin A1c which showed a value of 5.9 which was elevated but actually rather well. Subsequently I did also receive the x-ray of his foot which showed diffuse degenerative change but no underlying acute bony abnormality. 03/23/17 on evaluation today patient appears to be doing better in regard to his right plantar foot ulcer. He continues to show signs of improvement there's definitely not as much drainage at this point. He has been tolerating the dressing changes without complication he is not happy with the peg assist offloading shoe but at the same time I do believe that it is making good progress  as far as offloading is concerned. I still believe he may need to talk to a surgeon about surgically correcting a hammertoe in order to avoid additional pressure to the site especially since he Artie has diabetic shoes and they do not seem to have prevented callous buildup in ulcer formation. 03/30/17 on evaluation today patient appears to be doing better in regard to his right plantar foot ulcer. Continues to show signs of improvement which is good news. Unfortunately we were unable to get the appointment with the orthopedic, Dr. Doran Durand, whom I recommended for him due to patient having a balance at the practice. With that being said patient's foot ulcer does seem to be doing much better on evaluation today he still has some depth to the wound but overall we are seeing improvement and epithelialization week by week. Hopefully this is something that will close shortly. 04/05/17 on evaluation today patient unfortunately has what appears to be an area of the plantar surface of his ulcer where he had fluid collection where the callous grew over top of the wound bed. Unfortunately there was some pus like material noted during cleansing that we did sent for culture today. Hopefully this is not truly an infection is or does not appear to be any SAMIEL, PEEL. ([id number]) evidence of erythema surrounding and maybe this is just simply a small setback with a fluid collection that has caused this issue. Nonetheless we will see what that shows when we get the  culture back. I am also going to have it completely antibiotic which I previously placed him on anyway which will hopefully prevent any true infection from setting in. 04/12/17 on evaluation today patient's ulcer on the plantar aspect of his right foot actually appears to be better then during last weeks evaluation. In general he has been tolerating the dressing changes in utilizing the offloading shoe which does seem to be beneficial for him. With that  being said he still seems to get some pressure to the area just not nearly as much as he previously had noted. Again fortunately there is no significant pain. 04/19/17 on evaluation today patient appears to be doing excellent in regard to his right foot ulcer. He has been tolerating the dressing changes without complication. There really has not been any drainage over the past few days he tells me. With that being said he seems to be doing excellent in my opinion at this point he does have some callous buildup. 04/26/17 On evaluation today patient's wound appears to be completely healed which is great news. He has been tolerating the dressing changes without complication and I do think he has done very well in regard to the healing process and offloading he has listed everything that that we instructed him to do. Obviously this appears to have paid off and he has progressed very nicely. Readmission: 07/05/17 on evaluation today patient is seen for fault evaluation and our clinic concerning the same issue that I have previously treated him for ending back in April 2019. This is a callous region with subsequently an ulcer underneath this on the plantar aspect of his right second metatarsal region. Fortunately he's not having any significant discomfort although his girlfriend told him that she noted an area of bruising at the site underneath the callous and wanted him to come get this checked out. He was very please with our care previous and therefore was more than happy to come let us check this for him. Upon inspection initially he did have significant callous overlying the area in question. It was not easily identifiable as far as any ulcer was concerned. With that being said he with this significant callous did require some sharp debridement and following debridement there did appear to be a small ulcer underlying. Fortunately patient in general shows no signs of infection at this point. He again has  neuropathy and therefore does not have any ongoing discomfort or pain at this point. 07/12/17 on evaluation today patient actually appears to be doing very well in regard to his plantar foot ulcer. In fact this appears to be completely healed and there's no residual opening at this time. Overall I'm very pleased in this regard. Patient also is extremely pleased. Readmission: 02/08/18 patient presents today for follow-up evaluation he has previously been seen here in our office for the same issue. Fortunately there is not been any evidence of infection since I last saw him he does note that he is been going to see his local podiatrist for callous pairing. During that time they actually noted that he had an open wound and subsequently referred him back ties for evaluation of this wound. He has done very well in the past with good wound care often healing in just a few weeks. No fevers, chills, nausea, or vomiting noted at this time. 02/15/18 on evaluation today patient actually appears to be doing very well in regard to his plantar foot ulcer. He is been tolerating the dressing changes without complication.  There does appear to be some eschar of the line the wound surface. Fortunately I am happy with how things are progressing from the standpoint of the wound progress from last week to this week. 02/22/18 on evaluation today patient appears to be doing very well in regard to his plantar foot ulcer. He did have callous buildup around the area that required some sharp debridement today. He tolerated the debridement without complication. Post debridement the wound bed appears to be doing much better there's just a very tiny opening still remaining. 03/01/18 on evaluation today patient appears to be doing excellent in regard to his plantar foot ulcer. In fact after I debrided away what appeared to be a dried blood clot on the surface and actually appear there was just a very small opening remaining in the  central portion of the wound. This is literally a pinpoint region. Overall he has done well and I'm very pleased in this regard. I think that likely one more week will see this area completely healed. 03/08/18 on evaluation today patient appears to actually be doing very well in regard to his plantar foot ulcer. He has been tolerating the dressing changes without complication. Fortunately this appears to be completely healed at this point which is excellent news. Overall I'm extremely happy with how things have progressed. [name], [name] ([id number]) 06/21/18 on evaluation today patient presents for evaluation in our clinic concerning issues that he's having with you seeing the area of his right foot that is previously experienced issues with. Fortunately there does not appear to be any evidence of infection at this time which is good news. With that being said he tells me that he has noted the callous buildup of the past several months but it was only in the past roughly week that he has noted drainage from the callous. He's not having any significant discomfort other than the fact that he states walking on the area with the extensive callous seems to be an issue. Patient History Information obtained from Patient. Allergies penicillin Family History Cancer - Siblings, Diabetes - Father, Heart Disease - Mother, Hypertension - Mother, No family history of Hereditary Spherocytosis, Kidney Disease, Lung Disease, Seizures, Stroke, Thyroid Problems, Tuberculosis. Social History Former smoker, Marital Status - Single, Alcohol Use - Never, Drug Use - No History, Caffeine Use - Moderate. Medical History Eyes Denies history of Cataracts, Glaucoma, Optic Neuritis Ear/Nose/Mouth/Throat Denies history of Chronic sinus problems/congestion, Middle ear problems Hematologic/Lymphatic Denies history of Anemia, Hemophilia, Human Immunodeficiency Virus, Lymphedema, Sickle Cell Disease Respiratory Denies  history of Aspiration, Asthma, Chronic Obstructive Pulmonary Disease (COPD), Pneumothorax, Sleep Apnea, Tuberculosis Cardiovascular Patient has history of Arrhythmia - a fib, Hypertension Denies history of Angina, Congestive Heart Failure, Coronary Artery Disease, Deep Vein Thrombosis, Hypotension, Myocardial Infarction, Peripheral Arterial Disease, Peripheral Venous Disease, Phlebitis, Vasculitis Gastrointestinal Denies history of Cirrhosis , Colitis, Crohn s, Hepatitis A, Hepatitis B, Hepatitis C Endocrine Patient has history of Type II Diabetes Denies history of Type I Diabetes Genitourinary Denies history of End Stage Renal Disease Immunological Denies history of Lupus Erythematosus, Raynaud s, Scleroderma Integumentary (Skin) Denies history of History of Burn, History of pressure wounds Musculoskeletal Patient has history of Osteoarthritis Denies history of Gout, Rheumatoid Arthritis, Osteomyelitis Neurologic Denies history of Dementia, Neuropathy, Quadriplegia, Paraplegia, Seizure Disorder Oncologic Denies history of Received Chemotherapy, Received Radiation Medical And Surgical History Notes Cardiovascular High Cholesterol Review of Systems (ROS) Arata, Rashed J. ([id number]) Psychiatric Denies complaints or symptoms of Anxiety, Claustrophobia. Objective Constitutional sitting  or standing blood pressure is within target range for patient.. pulse regular and within target range for patient.[name] respirations regular, non-labored and within target range for patient.[name] temperature within target range for patient.. Well- nourished and well-hydrated in no acute distress. Vitals Time Taken: 10:54 AM, Height: 74 in, Weight: 233 lbs, BMI: 29.9, Temperature: 98.4 F, Pulse: 93 bpm, Respiratory Rate: 16 breaths/min, Blood Pressure: 123/78 mmHg. Eyes conjunctiva clear no eyelid edema noted. pupils equal round and reactive to light and accommodation. Ears, Nose, Mouth, and Throat no  gross abnormality of ear auricles or external auditory canals. normal hearing noted during conversation. mucus membranes moist. Respiratory normal breathing without difficulty. clear to auscultation bilaterally. Cardiovascular regular rate and rhythm with normal S1, S2. 2+ dorsalis pedis/posterior tibialis pulses. no clubbing, cyanosis, significant edema, Gastrointestinal (GI) soft, non-tender, non-distended, +BS. no ventral hernia noted. Musculoskeletal normal gait and posture. no significant deformity or arthritic changes, no loss or range of motion, no clubbing. Psychiatric this patient is able to make decisions and demonstrates good insight into disease process. Alert and Oriented x 3. pleasant and cooperative. General Notes: Upon inspection today patient's wound bed actually showed signs of significant callous buildup which did require sharp debridement today. I was able to debride down to good subcutaneous tissue clearing away a significant portion of callous today. Unfortunately he did have some bleeding post debridement which has been the case in the past when I've taken care of this wound area for him as well. With that being said I was able to get the bleeding stopped with the use of silver nitrate today. Overall getting this callous off I think is gonna allow this area to heal quite well. I do believe in offloading shoe would be appropriate for him he tells me that he actually does still have the previous shoe that we've given him that is functional and he will start to use this when he gets home today. Integumentary (Hair, Skin) Wound #4 status is Open. Original cause of wound was Pressure Injury. The wound is located on the [place]. The wound measures 3cm length x 2cm width x 1.2cm depth; 4.712cm^2 area and 5.655cm^3 volume. There is Fat Layer (Subcutaneous Tissue) Exposed exposed. There is no undermining noted. There is a medium amount of sanguinous drainage noted.  The wound margin is indistinct and nonvisible. There is no granulation within the wound bed. There is a large Lao, Shameer J. ([id number]) (67-100%) amount of necrotic tissue within the wound bed including Eschar. General Notes: Aline Brochure is over callus area Assessment Active Problems ICD-10 Type 2 diabetes mellitus with foot ulcer Non-pressure chronic ulcer of other part of right foot with fat layer exposed Essential (primary) hypertension Chronic atrial fibrillation, unspecified Procedures Wound #4 Pre-procedure diagnosis of Wound #4 is a Diabetic Wound/Ulcer of the Lower Extremity located on the Right,Plantar Foot .Severity of Tissue Pre Debridement is: Fat layer exposed. There was a Excisional Skin/Subcutaneous Tissue Debridement with a total area of 6 sq cm performed by STONE III, Valincia Touch E., PA-C. With the following instrument(s): Curette to remove Viable and Non-Viable tissue/material. Material removed includes Callus, Subcutaneous Tissue, and Slough after achieving pain control using Lidocaine. No specimens were taken. A time out was conducted at 11:25, prior to the start of the procedure. A Large amount of bleeding was controlled with Silver Nitrate. The procedure was tolerated well. Post Debridement Measurements: 3cm length x 2cm width x 1.2cm depth; 5.655cm^3 volume. Character of Wound/Ulcer Post Debridement requires further debridement. Severity of Tissue  Post Debridement is: Fat layer exposed. Post procedure Diagnosis Wound #4: Same as Pre-Procedure Plan Wound Cleansing: Wound #4 Right,Plantar Foot: Clean wound with Normal Saline. Anesthetic (add to Medication List): Wound #4 Right,Plantar Foot: Topical Lidocaine 4% cream applied to wound bed prior to debridement (In Clinic Only). Primary Wound Dressing: Wound #4 Right,Plantar Foot: Silver Alginate Secondary Dressing: Wound #4 Right,Plantar Foot: Gauze and Kerlix/Conform Dressing Change Frequency: Wound #4 Right,Plantar  Foot: Change dressing every other day. Follow-up Appointments: [name], [name] ([id number]) Wound #4 Right,Plantar Foot: Return Appointment in 1 week. Edema Control: Wound #4 Right,Plantar Foot: Elevate legs to the level of the heart and pump ankles as often as possible Off-Loading: Wound #4 Right,Plantar Foot: Open toe surgical shoe with peg assist. - right Additional Orders / Instructions: Wound #4 Right,Plantar Foot: Increase protein intake. My suggestion at this point is gonna be that we initiate treatment with silver alginate at this time for the next week. Patient is in agreement that plan. We will subsequently see were things stand at follow-up. If anything changes or worsens in the meantime he will contact the office and let me know. Otherwise hopefully he'll heal just as well this go around as he has in the past. Please see above for specific wound care orders. We will see patient for re-evaluation in 1 week(s) here in the clinic. If anything worsens or changes patient will contact our office for additional recommendations. Electronic Signature(s) Signed: 06/22/2018 2:50:08 AM By: Worthy Keeler PA-C Entered By: Worthy Keeler on 06/22/2018 01:23:35 [name], [name] ([id number]) -------------------------------------------------------------------------------- ROS/PFSH Details Patient Name: [name], [name]. Date of Service: 06/21/2018 10:45 AM Medical Record Number: [id number] Patient Account Number: [id number] Date of Birth/Sex: October 07, 1950 (68 y.o. M) Treating RN: Cornell Barman Primary Care Provider: Benita Stabile Other Clinician: Referring Provider: Benita Stabile Treating Provider/Extender: Melburn Hake, Majid Mccravy Weeks in Treatment: 0 Information Obtained From Patient Psychiatric Complaints and Symptoms: Negative for: Anxiety; Claustrophobia Eyes Medical History: Negative for: Cataracts; Glaucoma; Optic Neuritis Ear/Nose/Mouth/Throat Medical History: Negative for: Chronic sinus  problems/congestion; Middle ear problems Hematologic/Lymphatic Medical History: Negative for: Anemia; Hemophilia; Human Immunodeficiency Virus; Lymphedema; Sickle Cell Disease Respiratory Medical History: Negative for: Aspiration; Asthma; Chronic Obstructive Pulmonary Disease (COPD); Pneumothorax; Sleep Apnea; Tuberculosis Cardiovascular Medical History: Positive for: Arrhythmia - a fib; Hypertension Negative for: Angina; Congestive Heart Failure; Coronary Artery Disease; Deep Vein Thrombosis; Hypotension; Myocardial Infarction; Peripheral Arterial Disease; Peripheral Venous Disease; Phlebitis; Vasculitis Past Medical History Notes: High Cholesterol Gastrointestinal Medical History: Negative for: Cirrhosis ; Colitis; Crohnos; Hepatitis A; Hepatitis B; Hepatitis C Endocrine Medical History: Positive for: Type II Diabetes Negative for: Type I Diabetes Time with diabetes: 3 years Treated with: Oral agents [name], [name] J. ([id number]) Blood sugar tested every day: No Genitourinary Medical History: Negative for: End Stage Renal Disease Immunological Medical History: Negative for: Lupus Erythematosus; Raynaudos; Scleroderma Integumentary (Skin) Medical History: Negative for: History of Burn; History of pressure wounds Musculoskeletal Medical History: Positive for: Osteoarthritis Negative for: Gout; Rheumatoid Arthritis; Osteomyelitis Neurologic Medical History: Negative for: Dementia; Neuropathy; Quadriplegia; Paraplegia; Seizure Disorder Oncologic Medical History: Negative for: Received Chemotherapy; Received Radiation Immunizations Pneumococcal Vaccine: Received Pneumococcal Vaccination: Yes Immunization Notes: up to date Implantable Devices No devices added Family and Social History Cancer: Yes - Siblings; Diabetes: Yes - Father; Heart Disease: Yes - Mother; Hereditary Spherocytosis: No; Hypertension: Yes - Mother; Kidney Disease: No; Lung Disease: No; Seizures: No;  Stroke: No; Thyroid Problems: No; Tuberculosis: No; Former smoker; Marital Status - Single; Alcohol Use: Never; Drug  Use: No History; Caffeine Use: Moderate; Financial Concerns: No; Food, Clothing or Shelter Needs: No; Support System Lacking: No; Transportation Concerns: No Engineer, maintenance) Signed: 06/21/2018 5:54:26 PM By: Gretta Cool, BSN, RN, CWS, Kim RN, BSN Signed: 06/22/2018 2:50:08 AM By: Worthy Keeler PA-C Entered By: Gretta Cool, BSN, RN, CWS, Kim on 06/21/2018 11:02:08 Rutledge, Selsor [name] ([id number]) -------------------------------------------------------------------------------- SuperBill Details Patient Name: [name], [name]. Date of Service: 06/21/2018 Medical Record Number: [id number] Patient Account Number: [id number] Date of Birth/Sex: Jan 01, 1951 (68 y.o. M) Treating RN: Cornell Barman Primary Care Provider: Benita Stabile Other Clinician: Referring Provider: Benita Stabile Treating Provider/Extender: Melburn Hake, Melainie Krinsky Weeks in Treatment: 0 Diagnosis Coding ICD-10 Codes Code Description E11.621 Type 2 diabetes mellitus with foot ulcer L97.512 Non-pressure chronic ulcer of other part of right foot with fat layer exposed I10 Essential (primary) hypertension I48.20 Chronic atrial fibrillation, unspecified Facility Procedures CPT4 Code: 47998721 Description: [facility] VISIT-LEV 3 EST PT Modifier: Quantity: 1 CPT4 Code: [id number] Description: 11042 - DEB SUBQ TISSUE 20 SQ CM/< ICD-10 Diagnosis Description L97.512 Non-pressure chronic ulcer of other part of right foot with fa Modifier: t layer exposed Quantity: 1 Physician Procedures CPT4 Code: 4859276 Description: 99214 - WC PHYS LEVEL 4 - EST PT ICD-10 Diagnosis Description E11.621 Type 2 diabetes mellitus with foot ulcer L97.512 Non-pressure chronic ulcer of other part of right foot with fa I10 Essential (primary) hypertension I48.20 Chronic atrial  fibrillation, unspecified Modifier: 25 t layer exposed Quantity: 1 CPT4 Code:  [id number] Description: 11042 - WC PHYS SUBQ TISS 20 SQ CM ICD-10 Diagnosis Description L97.512 Non-pressure chronic ulcer of other part of right foot with fa Modifier: t layer exposed Quantity: 1 Electronic Signature(s) Signed: 06/22/2018 2:50:08 AM By: Worthy Keeler PA-C Entered By: Worthy Keeler on 06/22/2018 01:24:06

## 2018-06-22 NOTE — Progress Notes (Signed)
[name], [name] ([id number]) Visit Report for 06/21/2018 Allergy List Details Patient Name: [name], [name]. Date of Service: 06/21/2018 10:45 AM Medical Record Number: [id number] Patient Account Number: [id number] Date of Birth/Sex: 1950/01/26 (68 y.o. M) Treating RN: Cornell Barman Primary Care Rhenda [place]: Benita Stabile Other Clinician: Referring Shyanna Klingel: Benita Stabile Treating Tomie Spizzirri/Extender: Melburn Hake, HOYT Weeks in Treatment: 0 Allergies Active Allergies penicillin Allergy Notes Electronic Signature(s) Signed: 06/21/2018 5:54:26 PM By: Gretta Cool, BSN, RN, CWS, Kim RN, BSN Entered By: Gretta Cool, BSN, RN, CWS, Kim on 06/21/2018 11:13:19 Thalman, [name] ([id number]) -------------------------------------------------------------------------------- Arrival Information Details Patient Name: [name]. Date of Service: 06/21/2018 10:45 AM Medical Record Number: [id number] Patient Account Number: [id number] Date of Birth/Sex: 1950/05/20 (68 y.o. M) Treating RN: Cornell Barman Primary Care Makinzi Prieur: Benita Stabile Other Clinician: Referring Sylvestre Rathgeber: Benita Stabile Treating Ossie Yebra/Extender: Melburn Hake, HOYT Weeks in Treatment: 0 Visit Information Patient Arrived: Ambulatory Arrival Time: 10:52 Accompanied By: self Transfer Assistance: None Patient Identification Verified: Yes Secondary Verification Process Completed: Yes History Since Last Visit Added or deleted any medications: No Any new allergies or adverse reactions: No Had a fall or experienced change in activities of daily living that may affect risk of falls: No Signs or symptoms of abuse/neglect since last visito No Hospitalized since last visit: No Implantable device outside of the clinic excluding cellular tissue based products placed in the center since last visit: No Pain Present Now: No Electronic Signature(s) Signed: 06/21/2018 5:54:26 PM By: Gretta Cool, BSN, RN, CWS, Kim RN, BSN Entered By: Gretta Cool, BSN, RN, CWS, Kim on 06/21/2018  10:52:31 Sabia, [name] ([id number]) -------------------------------------------------------------------------------- Clinic Level of Care Assessment Details Patient Name: [name]. Date of Service: 06/21/2018 10:45 AM Medical Record Number: [id number] Patient Account Number: [id number] Date of Birth/Sex: 06/26/1950 (68 y.o. M) Treating RN: Cornell Barman Primary Care Jaely Silman: Benita Stabile Other Clinician: Referring Tonie Vizcarrondo: Benita Stabile Treating Ellissa Ayo/Extender: Melburn Hake, HOYT Weeks in Treatment: 0 Clinic Level of Care Assessment Items TOOL 1 Quantity Score []  - Use when EandM and Procedure is performed on INITIAL visit 0 ASSESSMENTS - Nursing Assessment / Reassessment X - General Physical Exam (combine w/ comprehensive assessment (listed just below) when 1 20 performed on new pt. evals) X- 1 25 Comprehensive Assessment (HX, ROS, Risk Assessments, Wounds Hx, etc.) ASSESSMENTS - Wound and Skin Assessment / Reassessment []  - Dermatologic / Skin Assessment (not related to wound area) 0 ASSESSMENTS - Ostomy and/or Continence Assessment and Care []  - Incontinence Assessment and Management 0 []  - 0 Ostomy Care Assessment and Management (repouching, etc.) PROCESS - Coordination of Care X - Simple Patient / Family Education for ongoing care 1 15 []  - 0 Complex (extensive) Patient / Family Education for ongoing care X- 1 10 Staff obtains Programmer, systems, Records, Test Results / Process Orders []  - 0 Staff telephones HHA, Nursing Homes / Clarify orders / etc []  - 0 Routine Transfer to another Facility (non-emergent condition) []  - 0 Routine Hospital Admission (non-emergent condition) X- 1 15 New Admissions / Biomedical engineer / Ordering NPWT, Apligraf, etc. []  - 0 Emergency Hospital Admission (emergent condition) PROCESS - Special Needs []  - Pediatric / Minor Patient Management 0 []  - 0 Isolation Patient Management []  - 0 Hearing / Language / Visual special needs []  -  0 Assessment of Community assistance (transportation, D/C planning, etc.) []  - 0 Additional assistance / Altered mentation []  - 0 Support Surface(s) Assessment (bed, cushion, seat, etc.) Rybarczyk, Rhythm J. ([id number]) INTERVENTIONS - Miscellaneous []  - External ear exam 0 []  -  0 Patient Transfer (multiple staff / Civil Service fast streamer / Similar devices) []  - 0 Simple Staple / Suture removal (25 or less) []  - 0 Complex Staple / Suture removal (26 or more) []  - 0 Hypo/Hyperglycemic Management (do not check if billed separately) X- 1 15 Ankle / Brachial Index (ABI) - do not check if billed separately Has the patient been seen at the hospital within the last three years: Yes Total Score: 100 Level Of Care: New/Established - Level 3 Electronic Signature(s) Signed: 06/21/2018 5:54:26 PM By: Gretta Cool, BSN, RN, CWS, Kim RN, BSN Entered By: Gretta Cool, BSN, RN, CWS, Kim on 06/21/2018 11:44:37 Brew, [name] ([id number]) -------------------------------------------------------------------------------- Encounter Discharge Information Details Patient Name: [name]. Date of Service: 06/21/2018 10:45 AM Medical Record Number: [id number] Patient Account Number: [id number] Date of Birth/Sex: [birth year] (68 y.o. M) Treating RN: Cornell Barman Primary Care Amal Renbarger: Benita Stabile Other Clinician: Referring Offie Waide: Benita Stabile Treating Yamari Ventola/Extender: Melburn Hake, HOYT Weeks in Treatment: 0 Encounter Discharge Information Items Post Procedure Vitals Discharge Condition: Stable Temperature (F): 98.7 Ambulatory Status: Ambulatory Pulse (bpm): 98 Discharge Destination: Home Respiratory Rate (breaths/min): 16 Transportation: Private Auto Blood Pressure (mmHg): 124/78 Accompanied By: self Schedule Follow-up Appointment: Yes Clinical Summary of Care: Electronic Signature(s) Signed: 06/21/2018 5:54:26 PM By: Gretta Cool, BSN, RN, CWS, Kim RN, BSN Entered By: Gretta Cool, BSN, RN, CWS, Kim on 06/21/2018 11:49:32 Thuman,  [name] ([id number]) -------------------------------------------------------------------------------- Lower Extremity Assessment Details Patient Name: SEVEN, DOLLENS. Date of Service: 06/21/2018 10:45 AM Medical Record Number: [id number] Patient Account Number: [id number] Date of Birth/Sex: [birth year] (68 y.o. M) Treating RN: Cornell Barman Primary Care Lisaanne Lawrie: Benita Stabile Other Clinician: Referring Najla Aughenbaugh: Benita Stabile Treating Eluzer Howdeshell/Extender: Melburn Hake, HOYT Weeks in Treatment: 0 Edema Assessment Assessed: [Left: No] [Right: No] [Left: Edema] [Right: :] Calf Left: Right: Point of Measurement: 30 cm From Medial Instep cm 39 cm Ankle Left: Right: Point of Measurement: 10 cm From Medial Instep cm 27.5 cm Vascular Assessment Pulses: Dorsalis Pedis Palpable: [Right:Yes] Doppler Audible: [Right:Yes] Posterior Tibial Palpable: [Right:Yes] Doppler Audible: [Right:Yes] Blood Pressure: Brachial: [Right:120] Dorsalis Pedis: 160 Ankle: Posterior Tibial: 140 Ankle Brachial Index: [Right:1.33] Electronic Signature(s) Signed: 06/21/2018 5:54:26 PM By: Gretta Cool, BSN, RN, CWS, Kim RN, BSN Entered By: Gretta Cool, BSN, RN, CWS, Kim on 06/21/2018 11:14:36 Albright, [name] ([id number]) -------------------------------------------------------------------------------- Multi Wound Chart Details Patient Name: [name] Date of Service: 06/21/2018 10:45 AM Medical Record Number: [id number] Patient Account Number: [id number] Date of Birth/Sex: 1950/09/07 (68 y.o. M) Treating RN: Cornell Barman Primary Care Ardean Melroy: Benita Stabile Other Clinician: Referring Pricilla Moehle: Benita Stabile Treating Damel Querry/Extender: Melburn Hake, HOYT Weeks in Treatment: 0 Vital Signs Height(in): 74 Pulse(bpm): 93 Weight(lbs): 230 Blood Pressure(mmHg): 123/78 Body Mass Index(BMI): 30 Temperature(F): 98.4 Respiratory Rate 16 (breaths/min): Photos: [N/A:N/A] Wound Location: Right Foot - Plantar N/A N/A Wounding Event:  Pressure Injury N/A N/A Primary Etiology: Diabetic Wound/Ulcer of the N/A N/A Lower Extremity Secondary Etiology: Pressure Ulcer N/A N/A Comorbid History: Arrhythmia, Hypertension, N/A N/A Type II Diabetes, Osteoarthritis Date Acquired: 06/13/2018 N/A N/A Weeks of Treatment: 0 N/A N/A Wound Status: Open N/A N/A Measurements L x W x D 0.5x0.5x0.9 N/A N/A (cm) Area (cm) : 0.196 N/A N/A Volume (cm) : 0.177 N/A N/A % Reduction in Area: 0.00% N/A N/A % Reduction in Volume: 0.00% N/A N/A Starting Position 1 12 (o'clock): Ending Position 1 6 (o'clock): Maximum Distance 1 (cm): 1.2 Undermining: Yes N/A N/A Classification: Grade 2 N/A N/A Exudate Amount: Medium N/A N/A Exudate Type: Sanguinous N/A N/A Exudate Color:  red N/A N/A Wound Margin: Indistinct, nonvisible N/A N/A Granulation Amount: None Present (0%) N/A N/A Liebman, Jaspreet J. ([id number]) Necrotic Amount: Large (67-100%) N/A N/A Necrotic Tissue: Eschar N/A N/A Exposed Structures: Fascia: No N/A N/A Fat Layer (Subcutaneous Tissue) Exposed: No Tendon: No Muscle: No Joint: No Bone: No Assessment Notes: Aline Brochure is over callus area N/A N/A Treatment Notes Electronic Signature(s) Signed: 06/21/2018 5:54:26 PM By: Gretta Cool, BSN, RN, CWS, Kim RN, BSN Entered By: Gretta Cool, BSN, RN, CWS, Kim on 06/21/2018 11:17:52 [place]dale, [name] ([id number]) -------------------------------------------------------------------------------- Multi-Disciplinary Care Plan Details Patient Name: RAYLEN, TANGONAN. Date of Service: 06/21/2018 10:45 AM Medical Record Number: [id number] Patient Account Number: [id number] Date of Birth/Sex: 10-31-1950 (68 y.o. M) Treating RN: Cornell Barman Primary Care Brenlee Koskela: Benita Stabile Other Clinician: Referring Dianna Deshler: Benita Stabile Treating Giovana Faciane/Extender: Melburn Hake, HOYT Weeks in Treatment: 0 Active Inactive Necrotic Tissue Nursing Diagnoses: Impaired tissue integrity related to necrotic/devitalized  tissue Goals: Necrotic/devitalized tissue will be minimized in the wound bed Date Initiated: 06/21/2018 Target Resolution Date: 06/21/2018 Goal Status: Active Patient/caregiver will verbalize understanding of reason and process for debridement of necrotic tissue Date Initiated: 06/21/2018 Target Resolution Date: 06/21/2018 Goal Status: Active Interventions: Assess patient pain level pre-, during and post procedure and prior to discharge Treatment Activities: Apply topical anesthetic as ordered : 06/21/2018 Notes: Orientation to the Wound Care Program Nursing Diagnoses: Knowledge deficit related to the wound healing center program Goals: Patient/caregiver will verbalize understanding of the [facility] Program Date Initiated: 06/21/2018 Target Resolution Date: 06/21/2018 Goal Status: Active Interventions: Provide education on orientation to the wound center Notes: Pressure Nursing Diagnoses: Knowledge deficit related to management of pressures ulcers Goals: ROB, MCIVER ([id number]) Patient will remain free from development of additional pressure ulcers Date Initiated: 06/21/2018 Target Resolution Date: 06/29/2018 Goal Status: Active Interventions: Assess: immobility, friction, shearing, incontinence upon admission and as needed Provide education on pressure ulcers Notes: Wound/Skin Impairment Nursing Diagnoses: Impaired tissue integrity Goals: Ulcer/skin breakdown will have a volume reduction of 30% by week 4 Date Initiated: 06/21/2018 Target Resolution Date: 07/21/2018 Goal Status: Active Interventions: Assess patient/caregiver ability to perform ulcer/skin care regimen upon admission and as needed Assess ulceration(s) every visit Treatment Activities: Referred to DME Balin Vandegrift for dressing supplies : 06/21/2018 Skin care regimen initiated : 06/21/2018 Topical wound management initiated : 06/21/2018 Notes: Electronic Signature(s) Signed: 06/21/2018 5:54:26 PM By: Gretta Cool, BSN,  RN, CWS, Kim RN, BSN Entered By: Gretta Cool, BSN, RN, CWS, Kim on 06/21/2018 11:17:37 Broady, [name] ([id number]) -------------------------------------------------------------------------------- Pain Assessment Details Patient Name: KHAMARION, BJELLAND. Date of Service: 06/21/2018 10:45 AM Medical Record Number: [id number] Patient Account Number: [id number] Date of Birth/Sex: 1951/01/19 (68 y.o. M) Treating RN: Cornell Barman Primary Care [place] Gipe: Benita Stabile Other Clinician: Referring Lewin Pellow: Benita Stabile Treating Teona Vargus/Extender: Melburn Hake, HOYT Weeks in Treatment: 0 Active Problems Location of Pain Severity and Description of Pain Patient Has Paino No Site Locations Pain Management and Medication Current Pain Management: Notes denies pain at this time. states it hurts sometime when he walks on it. Electronic Signature(s) Signed: 06/21/2018 5:54:26 PM By: Gretta Cool, BSN, RN, CWS, Kim RN, BSN Entered By: Gretta Cool, BSN, RN, CWS, Kim on 06/21/2018 10:52:59 Hiegel, [name] ([id number]) -------------------------------------------------------------------------------- Patient/Caregiver Education Details Patient Name: LAQUINN, SHIPPY. Date of Service: 06/21/2018 10:45 AM Medical Record Number: [id number] Patient Account Number: [id number] Date of Birth/Gender: [birth year] (68 y.o. M) Treating RN: Cornell Barman Primary Care Physician: Benita Stabile Other Clinician: Referring Physician: Benita Stabile Treating Physician/Extender: STONE III, HOYT Weeks in  Treatment: 0 Education Assessment Education Provided To: Patient Education Topics Provided Pressure: Handouts: Pressure Ulcers: Care and Offloading Methods: Demonstration, Explain/Verbal Responses: State content correctly Welcome To [facility]: Handouts: Welcome To [facility] Methods: Demonstration, Explain/Verbal Responses: State content correctly Wound/Skin Impairment: Handouts: Caring for Your Ulcer Methods: Demonstration,  Explain/Verbal Responses: State content correctly Electronic Signature(s) Signed: 06/21/2018 5:54:26 PM By: Gretta Cool, BSN, RN, CWS, Kim RN, BSN Entered By: Gretta Cool, BSN, RN, CWS, Kim on 06/21/2018 11:45:11 Lofaro, [name] ([id number]) -------------------------------------------------------------------------------- Wound Assessment Details Patient Name: AMEER, SANDEN. Date of Service: 06/21/2018 10:45 AM Medical Record Number: [id number] Patient Account Number: [id number] Date of Birth/Sex: Oct 20, 1950 (68 y.o. M) Treating RN: Cornell Barman Primary Care Mosella Kasa: Benita Stabile Other Clinician: Referring Thedora Rings: Benita Stabile Treating Shailynn Fong/Extender: STONE III, HOYT Weeks in Treatment: 0 Wound Status Wound Number: 4 Primary Etiology: Diabetic Wound/Ulcer of the Lower Extremity Wound Location: Right Foot - Plantar Secondary Pressure Ulcer Wounding Event: Pressure Injury Etiology: Date Acquired: 06/13/2018 Wound Status: Open Weeks Of Treatment: 0 Comorbid Arrhythmia, Hypertension, Type II Diabetes, Clustered Wound: No History: Osteoarthritis Photos Wound Measurements Length: (cm) 3 % Reduction Width: (cm) 2 % Reduction Depth: (cm) 1.2 Undermining: Area: (cm) 4.712 Volume: (cm) 5.655 in Area: 0% in Volume: 0% No Wound Description Classification: Grade 2 Foul Odor A Wound Margin: Indistinct, nonvisible Slough/Fibr Exudate Amount: Medium Exudate Type: Sanguinous Exudate Color: red fter Cleansing: No ino No Wound Bed Granulation Amount: None Present (0%) Exposed Structure Necrotic Amount: Large (67-100%) Fascia Exposed: No Necrotic Quality: Eschar Fat Layer (Subcutaneous Tissue) Exposed: Yes Tendon Exposed: No Muscle Exposed: No Joint Exposed: No Bone Exposed: No Assessment Notes Concepcion, Binyomin J. ([id number]) Wunf is over callus area Treatment Notes Wound #4 (Right, Plantar Foot) 1. Cleansed with: Clean wound with Normal Saline 2. Anesthetic Topical Lidocaine 4% cream  to wound bed prior to debridement 4. Dressing Applied: Other dressing (specify in notes) 5. Secondary Dressing Applied Gauze and Kerlix/Conform 6. Footwear/Offloading device applied Surgical shoe 7. Secured with Recruitment consultant) Signed: 06/21/2018 5:54:26 PM By: Gretta Cool, BSN, RN, CWS, Kim RN, BSN Entered By: Gretta Cool, BSN, RN, CWS, Kim on 06/21/2018 11:41:20 Gintz, [name] ([id number]) -------------------------------------------------------------------------------- Vitals Details Patient Name: GABREAL, WORTON. Date of Service: 06/21/2018 10:45 AM Medical Record Number: [id number] Patient Account Number: [id number] Date of Birth/Sex: 25-Oct-1950 (68 y.o. M) Treating RN: Cornell Barman Primary Care Haru Shaff: Benita Stabile Other Clinician: Referring Arlita Buffkin: Benita Stabile Treating Verlean Allport/Extender: Melburn Hake, HOYT Weeks in Treatment: 0 Vital Signs Time Taken: 10:54 Temperature (F): 98.4 Height (in): 74 Pulse (bpm): 93 Weight (lbs): 233 Respiratory Rate (breaths/min): 16 Body Mass Index (BMI): 29.9 Blood Pressure (mmHg): 123/78 Reference Range: 80 - 120 mg / dl Electronic Signature(s) Signed: 06/21/2018 5:54:26 PM By: Gretta Cool, BSN, RN, CWS, Kim RN, BSN Entered By: Gretta Cool, BSN, RN, CWS, Kim on 06/21/2018 11:56:13

## 2018-06-22 NOTE — Progress Notes (Signed)
KORD, MONETTE ([id number]) Visit Report for 06/21/2018 Abuse/Suicide Risk Screen Details Patient Name: [name], [name]. Date of Service: 06/21/2018 10:45 AM Medical Record Number: [id number] Patient Account Number: [id number] Date of Birth/Sex: 1950/05/17 (68 y.o. M) Treating RN: Cornell Barman Primary Care Sanad Fearnow: Benita Stabile Other Clinician: Referring Harris Penton: Benita Stabile Treating Shaquon Gropp/Extender: STONE III, HOYT Weeks in Treatment: 0 Abuse/Suicide Risk Screen Items Answer ABUSE RISK SCREEN: Has anyone close to you tried to hurt or harm you recentlyo No Do you feel uncomfortable with anyone in your familyo No Has anyone forced you do things that you didnot want to doo No Electronic Signature(s) Signed: 06/21/2018 5:54:26 PM By: Gretta Cool, BSN, RN, CWS, Kim RN, BSN Entered By: Gretta Cool, BSN, RN, CWS, Kim on 06/21/2018 11:14:49 Landini, [name] ([id number]) -------------------------------------------------------------------------------- Activities of Daily Living Details Patient Name: [name], [name]. Date of Service: 06/21/2018 10:45 AM Medical Record Number: [id number] Patient Account Number: [id number] Date of Birth/Sex: April 29, 1950 (68 y.o. M) Treating RN: Cornell Barman Primary Care Savalas Monje: Benita Stabile Other Clinician: Referring Janai Brannigan: Benita Stabile Treating Marsena Taff/Extender: Melburn Hake, HOYT Weeks in Treatment: 0 Activities of Daily Living Items Answer Activities of Daily Living (Please select one for each item) Drive Automobile Completely Able Take Medications Completely Able Use Telephone Completely Able Care for Appearance Completely Able Use Toilet Completely Able Bath / Shower Completely Able Dress Self Completely Able Feed Self Completely Able Walk Completely Able Get In / Out Bed Completely Able Housework Completely Able Prepare Meals Completely Able Handle Money Completely Able Shop for Self Completely Able Electronic Signature(s) Signed: 06/21/2018 5:54:26 PM By: Gretta Cool,  BSN, RN, CWS, Kim RN, BSN Entered By: Gretta Cool, BSN, RN, CWS, Kim on 06/21/2018 11:15:01 [name], [name] ([id number]) -------------------------------------------------------------------------------- Education Screening Details Patient Name: [name], [name]. Date of Service: 06/21/2018 10:45 AM Medical Record Number: [id number] Patient Account Number: [id number] Date of Birth/Sex: 02/03/1950 (68 y.o. M) Treating RN: Cornell Barman Primary Care Carlus Stay: Benita Stabile Other Clinician: Referring Posie Lillibridge: Benita Stabile Treating Adalynne Steffensmeier/Extender: Melburn Hake, HOYT Weeks in Treatment: 0 Learning Preferences/Education Level/Primary Language Highest Education Level: High School Preferred Language: English Cognitive Barrier Language Barrier: No Translator Needed: No Memory Deficit: No Emotional Barrier: No Physical Barrier Impaired Vision: No Impaired Hearing: No Decreased Hand dexterity: No Knowledge/Comprehension Knowledge Level: High Comprehension Level: High Ability to understand written High instructions: Ability to understand verbal High instructions: Motivation Anxiety Level: Calm Cooperation: Cooperative Education Importance: Acknowledges Need Interest in Health Problems: Asks Questions Perception: Coherent Willingness to Engage in Self- High Management Activities: Readiness to Engage in Self- High Management Activities: Electronic Signature(s) Signed: 06/21/2018 5:54:26 PM By: Gretta Cool, BSN, RN, CWS, Kim RN, BSN Entered By: Gretta Cool, BSN, RN, CWS, Kim on 06/21/2018 11:15:22 [name], [name] ([id number]) -------------------------------------------------------------------------------- Fall Risk Assessment Details Patient Name: [name]. Date of Service: 06/21/2018 10:45 AM Medical Record Number: [id number] Patient Account Number: [id number] Date of Birth/Sex: [birth year] (68 y.o. M) Treating RN: Cornell Barman Primary Care Theda Payer: Benita Stabile Other Clinician: Referring Kaylany Tesoriero:  Benita Stabile Treating Dalyn Kjos/Extender: Melburn Hake, HOYT Weeks in Treatment: 0 Fall Risk Assessment Items Have you had 2 or more falls in the last 12 monthso 0 No Have you had any fall that resulted in injury in the last 12 monthso 0 No FALLS RISK SCREEN History of falling - immediate or within 3 months 0 No Secondary diagnosis (Do you have 2 or more medical diagnoseso) 0 No Ambulatory aid None/bed rest/wheelchair/nurse 0 Yes Crutches/cane/walker 0 No Furniture 0 No Intravenous therapy Access/Saline/Heparin [employer]  0 No Gait/Transferring Normal/ bed rest/ wheelchair 0 Yes Weak (short steps with or without shuffle, stooped but able to lift head while 0 No walking, may seek support from furniture) Impaired (short steps with shuffle, may have difficulty arising from chair, head 0 No down, impaired balance) Mental Status Oriented to own ability 0 Yes Electronic Signature(s) Signed: 06/21/2018 5:54:26 PM By: Gretta Cool, BSN, RN, CWS, Kim RN, BSN Entered By: Gretta Cool, BSN, RN, CWS, Kim on 06/21/2018 11:15:40 [name], [name] ([id number]) -------------------------------------------------------------------------------- Foot Assessment Details Patient Name: [name], [name]. Date of Service: 06/21/2018 10:45 AM Medical Record Number: [id number] Patient Account Number: [id number] Date of Birth/Sex: [birth year] (68 y.o. M) Treating RN: Cornell Barman Primary Care Megahn Killings: Benita Stabile Other Clinician: Referring Jaelon Gatley: Benita Stabile Treating Annora Guderian/Extender: Melburn Hake, HOYT Weeks in Treatment: 0 Foot Assessment Items Site Locations + = Sensation present, - = Sensation absent, C = Callus, U = Ulcer R = Redness, W = Warmth, M = Maceration, PU = Pre-ulcerative lesion F = Fissure, S = Swelling, D = Dryness Assessment Right: Left: Other Deformity: No No Prior Foot Ulcer: No No Prior Amputation: No No Charcot Joint: No No Ambulatory Status: Ambulatory Without Help Gait: Steady Electronic  Signature(s) Signed: 06/21/2018 5:54:26 PM By: Gretta Cool, BSN, RN, CWS, Kim RN, BSN Entered By: Gretta Cool, BSN, RN, CWS, Kim on 06/21/2018 11:16:08 [name], [name] ([id number]) -------------------------------------------------------------------------------- Nutrition Risk Screening Details Patient Name: [name], [name]. Date of Service: 06/21/2018 10:45 AM Medical Record Number: [id number] Patient Account Number: [id number] Date of Birth/Sex: Apr 13, 1950 (68 y.o. M) Treating RN: Cornell Barman Primary Care Antwaine Boomhower: Benita Stabile Other Clinician: Referring Marieann Zipp: Benita Stabile Treating Sosha Shepherd/Extender: Melburn Hake, HOYT Weeks in Treatment: 0 Height (in): 74 Weight (lbs): 230 Body Mass Index (BMI): 29.5 Nutrition Risk Screening Items Score Screening NUTRITION RISK SCREEN: I have an illness or condition that made me change the kind and/or amount of 0 No food I eat I eat fewer than two meals per day 0 No I eat few fruits and vegetables, or milk products 0 No I have three or more drinks of beer, liquor or wine almost every day 0 No I have tooth or mouth problems that make it hard for me to eat 0 No I don't always have enough money to buy the food I need 0 No I eat alone most of the time 0 No I take three or more different prescribed or over-the-counter drugs a day 0 No Without wanting to, I have lost or gained 10 pounds in the last six months 0 No I am not always physically able to shop, cook and/or feed myself 0 No Nutrition Protocols Good Risk Protocol 0 No interventions needed Moderate Risk Protocol High Risk Proctocol Risk Level: Good Risk Score: 0 Electronic Signature(s) Signed: 06/21/2018 5:54:26 PM By: Gretta Cool, BSN, RN, CWS, Kim RN, BSN Entered By: Gretta Cool, BSN, RN, CWS, Kim on 06/21/2018 11:15:49

## 2018-06-28 ENCOUNTER — Encounter: Payer: PPO | Admitting: Physician Assistant

## 2018-06-28 ENCOUNTER — Other Ambulatory Visit: Payer: Self-pay

## 2018-06-28 DIAGNOSIS — E11621 Type 2 diabetes mellitus with foot ulcer: Secondary | ICD-10-CM | POA: Diagnosis not present

## 2018-06-28 DIAGNOSIS — L97512 Non-pressure chronic ulcer of other part of right foot with fat layer exposed: Secondary | ICD-10-CM | POA: Diagnosis not present

## 2018-06-29 NOTE — Progress Notes (Signed)
[name] ([id number]) Visit Report for 06/28/2018 Arrival Information Details Patient Name: [name], [name]. Date of Service: 06/28/2018 8:00 AM Medical Record Number: [id number] Patient Account Number: [id number] Date of Birth/Sex: [birth year] (68 y.o. M) Treating RN: Army Melia Primary Care Anjelique Makar: Benita Stabile Other Clinician: Referring Ceejay Kegley: Benita Stabile Treating Katherine Tout/Extender: Melburn Hake, HOYT Weeks in Treatment: 1 Visit Information History Since Last Visit Added or deleted any medications: No Patient Arrived: Ambulatory Any new allergies or adverse reactions: No Arrival Time: 08:13 Had a fall or experienced change in No Accompanied By: self activities of daily living that may affect Transfer Assistance: None risk of falls: Signs or symptoms of abuse/neglect since last visito No Hospitalized since last visit: No Has Dressing in Place as Prescribed: Yes Pain Present Now: No Electronic Signature(s) Signed: 06/28/2018 12:39:49 PM By: Army Melia Entered By: Army Melia on 06/28/2018 08:14:12 [name], [name] ([id number]) -------------------------------------------------------------------------------- Encounter Discharge Information Details Patient Name: [name]. Date of Service: 06/28/2018 8:00 AM Medical Record Number: [id number] Patient Account Number: [id number] Date of Birth/Sex: [birth year] (68 y.o. M) Treating RN: Montey Hora Primary Care Kaliana Albino: Benita Stabile Other Clinician: Referring Polo Mcmartin: TATE, Sharlet Salina Treating Princes Finger/Extender: Melburn Hake, HOYT Weeks in Treatment: 1 Encounter Discharge Information Items Post Procedure Vitals Discharge Condition: Stable Temperature (F): 98.0 Ambulatory Status: Ambulatory Pulse (bpm): 67 Discharge Destination: Home Respiratory Rate (breaths/min): 16 Transportation: Private Auto Blood Pressure (mmHg): 149/79 Accompanied By: self Schedule Follow-up Appointment: Yes Clinical Summary of Care: Electronic  Signature(s) Signed: 06/28/2018 1:16:30 PM By: Montey Hora Entered By: Montey Hora on 06/28/2018 08:44:19 [name], [name] ([id number]) -------------------------------------------------------------------------------- Lower Extremity Assessment Details Patient Name: [name]. Date of Service: 06/28/2018 8:00 AM Medical Record Number: [id number] Patient Account Number: [id number] Date of Birth/Sex: March 04, 1950 (68 y.o. M) Treating RN: Army Melia Primary Care Charlott Calvario: Benita Stabile Other Clinician: Referring Yaffa Seckman: Benita Stabile Treating Jacion Dismore/Extender: STONE III, HOYT Weeks in Treatment: 1 Edema Assessment Assessed: [Left: No] [Right: No] Edema: [Left: N] [Right: o] Electronic Signature(s) Signed: 06/28/2018 12:39:49 PM By: Army Melia Entered By: Army Melia on 06/28/2018 08:18:15 [name], [name] ([id number]) -------------------------------------------------------------------------------- Multi Wound Chart Details Patient Name: [name]. Date of Service: 06/28/2018 8:00 AM Medical Record Number: [id number] Patient Account Number: [id number] Date of Birth/Sex: [birth year] (68 y.o. 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] [N/A:N/A] Secondary Etiology: [4:Pressure Ulcer] [N/A:N/A] Comorbid History: [4:Arrhythmia, Hypertension, Type II Diabetes, Osteoarthritis] [N/A:N/A] Date Acquired: [4:06/13/2018] [N/A:N/A] Weeks of Treatment: [4:1] [N/A:N/A] Wound Status:  [4:Open] [N/A:N/A] Measurements L x W x D [4:0.3x0.4x0.2] [N/A:N/A] (cm) Area (cm) : [4:0.094] [N/A:N/A] Volume (cm) : [4:0.019] [N/A:N/A] % Reduction in Area: [4:98.00%] [N/A:N/A] % Reduction in Volume: [4:99.70%] [N/A:N/A] Classification: [4:Grade 2] [N/A:N/A] Exudate Amount: [4:Medium] [N/A:N/A] Exudate Type: [4:Sanguinous] [N/A:N/A] Exudate Color: [4:red] [N/A:N/A] Wound Margin: [4:Indistinct, nonvisible] [N/A:N/A] Granulation Amount: [4:Small (1-33%)] [N/A:N/A] Granulation Quality: [4:Red] [N/A:N/A] Necrotic Amount: [4:Medium (34-66%)] [N/A:N/A] Necrotic Tissue: [4:Eschar] [N/A:N/A] Exposed Structures: [4:Fat Layer (Subcutaneous Tissue) Exposed: Yes Fascia: No Tendon: No Muscle: No Joint: No Bone: No Small (1-33%)] [N/A:N/A N/A] Treatment Notes [name], [name] ([id number]) Electronic Signature(s) Signed: 06/28/2018 1:16:30 PM By: Montey Hora Entered By:  Montey Hora on 06/28/2018 08:26:58 [name], [name] ([id number]) -------------------------------------------------------------------------------- Multi-Disciplinary Care Plan Details Patient Name: [name], [name]. Date of Service: 06/28/2018 8:00 AM Medical Record Number: [id number] Patient Account Number: [id number] Date of Birth/Sex: 1950/04/05 (68 y.o. M) Treating RN: Montey Hora Primary Care Lateefah Mallery: Benita Stabile Other Clinician: Referring Lamisha Roussell: Benita Stabile Treating Samanthia Howland/Extender: Melburn Hake, HOYT Weeks in Treatment: 1 Active Inactive Necrotic Tissue Nursing Diagnoses: Impaired tissue integrity related to necrotic/devitalized tissue Goals: Necrotic/devitalized tissue will be minimized in the wound bed Date Initiated: 06/21/2018 Target Resolution Date: 06/21/2018 Goal Status: Active Patient/caregiver will verbalize understanding of reason and process for debridement of necrotic tissue Date Initiated: 06/21/2018 Target Resolution Date: 06/21/2018 Goal Status: Active Interventions: Assess patient pain level  pre-, during and post procedure and prior to discharge Treatment Activities: Apply topical anesthetic as ordered : 06/21/2018 Notes: Orientation to the Wound Care Program Nursing Diagnoses: Knowledge deficit related to the wound healing center program Goals: Patient/caregiver will verbalize understanding of the [facility] Program Date Initiated: 06/21/2018 Target Resolution Date: 06/21/2018 Goal Status: Active Interventions: Provide education on orientation to the wound center Notes: Pressure Nursing Diagnoses: Knowledge deficit related to management of pressures ulcers Goals: LENARD, KAMPF ([id number]) Patient will remain free from development of additional pressure ulcers Date Initiated: 06/21/2018 Target Resolution Date: 06/29/2018 Goal Status: Active Interventions: Assess: immobility, friction, shearing, incontinence upon admission and as needed Provide education on pressure ulcers Notes: Wound/Skin Impairment Nursing Diagnoses: Impaired tissue integrity Goals: Ulcer/skin breakdown will have a volume reduction of 30% by week 4 Date Initiated: 06/21/2018 Target Resolution Date: 07/21/2018 Goal Status: Active Interventions: Assess patient/caregiver ability to perform ulcer/skin care regimen upon admission and as needed Assess ulceration(s) every visit Treatment Activities: Referred to DME Royalty Domagala for dressing supplies : 06/21/2018 Skin care regimen initiated : 06/21/2018 Topical wound management initiated : 06/21/2018 Notes: Electronic Signature(s) Signed: 06/28/2018 1:16:30 PM By: Montey Hora Entered By: Montey Hora on 06/28/2018 08:25:57 [name], [name] ([id number]) -------------------------------------------------------------------------------- Pain Assessment Details Patient Name: [name]. Date of Service: 06/28/2018 8:00 AM Medical Record Number: [id number] Patient Account Number: [id number] Date of Birth/Sex: 03-May-1950 (68 y.o. M) Treating RN: Army Melia Primary Care Haze Antillon: Benita Stabile Other Clinician: Referring Lanita Stammen: Benita Stabile Treating Keysi Oelkers/Extender: STONE III, HOYT Weeks in Treatment: 1 Active Problems Location of Pain Severity and Description of Pain Patient Has Paino No Site Locations Pain Management and Medication Current Pain Management: Electronic Signature(s) Signed: 06/28/2018 12:39:49 PM By: Army Melia Entered By: Army Melia on 06/28/2018 08:14:17 [name], [name] ([id number]) -------------------------------------------------------------------------------- Patient/Caregiver Education Details Patient Name: [name] Date of Service: 06/28/2018 8:00 AM Medical Record Number: [id number] Patient Account Number: [id number] Date of Birth/Gender: 02-17-1950 (68 y.o. M) Treating RN: Montey Hora Primary Care Physician: Benita Stabile Other Clinician: Referring Physician: Benita Stabile Treating Physician/Extender: Sharalyn Ink in Treatment: 1 Education Assessment Education Provided To: Patient Education Topics Provided Wound/Skin Impairment: Handouts: Other: wound care as ordered Methods: Demonstration, Explain/Verbal Responses: State content correctly Electronic Signature(s) Signed: 06/28/2018 1:16:30 PM By: Montey Hora Entered By: Montey Hora on 06/28/2018 08:43:24 [name], [name] ([id number]) -------------------------------------------------------------------------------- Wound Assessment Details Patient Name: [name]. Date of Service: 06/28/2018 8:00 AM Medical Record Number: [id number] Patient Account Number: [id number] Date of Birth/Sex: [birth year] (68 y.o. M) Treating RN: Army Melia Primary Care Mardie Kellen: Benita Stabile Other Clinician: Referring Izyk Marty: Benita Stabile Treating Tapanga [place]/Extender: STONE III, HOYT Weeks in Treatment: 1 Wound Status Wound Number: 4 Primary Etiology: Diabetic Wound/Ulcer of the Lower  Extremity Wound Location: Right Foot - Plantar Secondary  Pressure Ulcer Wounding Event: Pressure Injury Etiology: Date Acquired: 06/13/2018 Wound Status: Open Weeks Of Treatment: 1 Comorbid Arrhythmia, Hypertension, Type II Diabetes, Clustered Wound: No History: Osteoarthritis Photos Photo Uploaded By: Army Melia on 06/28/2018 12:36:48 Wound Measurements Length: (cm) 0.3 Width: (cm) 0.4 Depth: (cm) 0.2 Area: (cm) 0.094 Volume: (cm) 0.019 % Reduction in Area: 98% % Reduction in Volume: 99.7% Epithelialization: Small (1-33%) Tunneling: No Undermining: No Wound Description Classification: Grade 2 Foul Odo Wound Margin: Indistinct, nonvisible Slough/F Exudate Amount: Medium Exudate Type: Sanguinous Exudate Color: red r After Cleansing: No ibrino No Wound Bed Granulation Amount: Small (1-33%) Exposed Structure Granulation Quality: Red Fascia Exposed: No Necrotic Amount: Medium (34-66%) Fat Layer (Subcutaneous Tissue) Exposed: Yes Necrotic Quality: Eschar Tendon Exposed: No Muscle Exposed: No Joint Exposed: No Bone Exposed: No [name], [name] ([id number]) Treatment Notes Wound #4 (Right, Plantar Foot) Notes silvercel, gauze and telfa Environmental health practitioner Signature(s) Signed: 06/28/2018 12:39:49 PM By: Army Melia Entered By: Army Melia on 06/28/2018 08:18:03 [name], [name] ([id number]) -------------------------------------------------------------------------------- Vitals Details Patient Name: [name]. Date of Service: 06/28/2018 8:00 AM Medical Record Number: [id number] Patient Account Number: [id number] Date of Birth/Sex: [birth year] (68 y.o. M) Treating RN: Army Melia Primary Care Felis Quillin: Benita Stabile Other Clinician: Referring Adon Gehlhausen: Benita Stabile Treating Larkin Alfred/Extender: STONE III, HOYT Weeks in Treatment: 1 Vital Signs Time Taken: 08:14 Temperature (F): 98.0 Height (in): 74 Pulse (bpm): 67 Weight (lbs): 233 Respiratory Rate (breaths/min): 16 Body Mass Index (BMI): 29.9 Blood Pressure  (mmHg): 149/79 Reference Range: 80 - 120 mg / dl Electronic Signature(s) Signed: 06/28/2018 12:39:49 PM By: Army Melia Entered By: Army Melia on 06/28/2018 08:14:33

## 2018-06-29 NOTE — Progress Notes (Signed)
KONG, PACKETT ([id number]) Visit Report for 06/28/2018 Chief Complaint Document Details Patient Name: [name], [name]. Date of Service: 06/28/2018 8:00 AM Medical Record Number: [id number] Patient Account Number: [id number] Date of Birth/Sex: Mar 16, 1950 (68 y.o. M) Treating RN: Montey Hora Primary Care Provider: Benita Stabile Other Clinician: Referring Provider: Benita Stabile Treating Provider/Extender: Melburn Hake, HOYT Weeks in Treatment: 1 Information Obtained from: Patient Chief Complaint Right plantar foot ulcer Electronic Signature(s) Signed: 06/29/2018 12:15:24 AM By: Worthy Keeler PA-C Entered By: Worthy Keeler on 06/28/2018 08:19:00 Quirk, [name] ([id number]) -------------------------------------------------------------------------------- Debridement Details Patient Name: [name]. Date of Service: 06/28/2018 8:00 AM Medical Record Number: [id number] Patient Account Number: [id number] Date of Birth/Sex: [birth year] (68 y.o. M) Treating RN: Montey Hora Primary Care Provider: Benita Stabile Other Clinician: Referring Provider: Benita Stabile Treating Provider/Extender: STONE III, HOYT Weeks in Treatment: 1 Debridement Performed for Wound #4 Right,Plantar Foot Assessment: Performed By: Physician STONE III, HOYT E., PA-C Debridement Type: Debridement Severity of Tissue Pre Fat layer exposed Debridement: Level of Consciousness (Pre- Awake and Alert procedure): Pre-procedure Verification/Time Yes - 08:26 Out Taken: Start Time: 08:26 Pain Control: Lidocaine 4% Topical Solution Total Area Debrided (L x W): 0.3 (cm) x 0.4 (cm) = 0.12 (cm) Tissue and other material Viable, Non-Viable, Callus, Slough, Subcutaneous, Slough debrided: Level: Skin/Subcutaneous Tissue Debridement Description: Excisional Instrument: Curette Bleeding: Minimum Hemostasis Achieved: Pressure End Time: 08:30 Procedural Pain: 0 Post Procedural Pain: 0 Response to Treatment: Procedure was  tolerated well Level of Consciousness Awake and Alert (Post-procedure): Post Debridement Measurements of Total Wound Length: (cm) 0.5 Width: (cm) 0.7 Depth: (cm) 0.4 Volume: (cm) 0.11 Character of Wound/Ulcer Post Debridement: Improved Severity of Tissue Post Debridement: Fat layer exposed Post Procedure Diagnosis Same as Pre-procedure Electronic Signature(s) Signed: 06/28/2018 1:16:30 PM By: Montey Hora Signed: 06/29/2018 12:15:24 AM By: Worthy Keeler PA-C Entered By: Montey Hora on 06/28/2018 08:35:36 Schimpf, [name] ([id number]) -------------------------------------------------------------------------------- HPI Details Patient Name: [name]. Date of Service: 06/28/2018 8:00 AM Medical Record Number: [id number] Patient Account Number: [id number] Date of Birth/Sex: 03-28-1950 (68 y.o. M) Treating RN: Montey Hora Primary Care Provider: Benita Stabile Other Clinician: Referring Provider: Benita Stabile Treating Provider/Extender: STONE III, HOYT Weeks in Treatment: 1 History of Present Illness Associated Signs and Symptoms: Patient has a history of diabetes mellitus type II, hypertension, and atrial fibrillation. He also has a hammertoe deformity of the bilateral feet which seems to be causing pressure in the ball of his foot. HPI Description: 03/09/17 on evaluation today patient presents for his initial evaluation concerning an ulcer on the plantar aspect of his right foot which has been open he tells me for about three weeks. He has been seen at [facility] where they did perform debridement it appears according to a note on 02/22/17 where unfortunately it appears that his callous area began to break down into an ulcer after modifications have been added to his insulin. This had happened previously as well. However I do not have details of the severity of debridement at that point although it does not sound as if you the debridement was too significant based on what  the patient is telling me that he still had a lot of callous following debridement. With that being said they were going to look into altering his insoles to try to prevent further breakdown in the future. Nonetheless he has had foul odor discharge coming from the ulcer which is noted all the way back to that visit on 02/22/17  [facility]. Patient states that this was concerning him more than anything else. He does have diabetes although he described this as "borderline diabetes" he is on metoprolol however along with lisinopril. Patient is not having any issues with pain in regard to his right plantar foot. 03/16/17 on evaluation today patient appears to be doing much better in regard to his plantar foot ulcer. He actually tells me that he loves the peg assist offloading shoe and that he hasn't had any pain in the ulcer area from the callous since I worked on it last week. Overall he is extremely happy with how things have progressed. Likewise the wound bed has no slough noted there is no evidence of infection and it looks excellent. I did receive the results of his hemoglobin A1c which showed a value of 5.9 which was elevated but actually rather well. Subsequently I did also receive the x-ray of his foot which showed diffuse degenerative change but no underlying acute bony abnormality. 03/23/17 on evaluation today patient appears to be doing better in regard to his right plantar foot ulcer. He continues to show signs of improvement there's definitely not as much drainage at this point. He has been tolerating the dressing changes without complication he is not happy with the peg assist offloading shoe but at the same time I do believe that it is making good progress as far as offloading is concerned. I still believe he may need to talk to a surgeon about surgically correcting a hammertoe in order to avoid additional pressure to the site especially since he Artie has diabetic shoes and they do  not seem to have prevented callous buildup in ulcer formation. 03/30/17 on evaluation today patient appears to be doing better in regard to his right plantar foot ulcer. Continues to show signs of improvement which is good news. Unfortunately we were unable to get the appointment with the orthopedic, Dr. Doran Durand, whom I recommended for him due to patient having a balance at the practice. With that being said patient's foot ulcer does seem to be doing much better on evaluation today he still has some depth to the wound but overall we are seeing improvement and epithelialization week by week. Hopefully this is something that will close shortly. 04/05/17 on evaluation today patient unfortunately has what appears to be an area of the plantar surface of his ulcer where he had fluid collection where the callous grew over top of the wound bed. Unfortunately there was some pus like material noted during cleansing that we did sent for culture today. Hopefully this is not truly an infection is or does not appear to be any evidence of erythema surrounding and maybe this is just simply a small setback with a fluid collection that has caused this issue. Nonetheless we will see what that shows when we get the culture back. I am also going to have it completely antibiotic which I previously placed him on anyway which will hopefully prevent any true infection from setting in. 04/12/17 on evaluation today patient's ulcer on the plantar aspect of his right foot actually appears to be better then during last weeks evaluation. In general he has been tolerating the dressing changes in utilizing the offloading shoe which does seem to be beneficial for him. With that being said he still seems to get some pressure to the area just not nearly as much as he previously had noted. Again fortunately there is no significant pain. JEURY, MCNAB ([id number]) 04/19/17 on  evaluation today patient appears to be doing excellent in regard  to his right foot ulcer. He has been tolerating the dressing changes without complication. There really has not been any drainage over the past few days he tells me. With that being said he seems to be doing excellent in my opinion at this point he does have some callous buildup. 04/26/17 On evaluation today patient's wound appears to be completely healed which is great news. He has been tolerating the dressing changes without complication and I do think he has done very well in regard to the healing process and offloading he has listed everything that that we instructed him to do. Obviously this appears to have paid off and he has progressed very nicely. Readmission: 07/05/17 on evaluation today patient is seen for fault evaluation and our clinic concerning the same issue that I have previously treated him for ending back in April 2019. This is a callous region with subsequently an ulcer underneath this on the plantar aspect of his right second metatarsal region. Fortunately he's not having any significant discomfort although his girlfriend told him that she noted an area of bruising at the site underneath the callous and wanted him to come get this checked out. He was very please with our care previous and therefore was more than happy to come let us check this for him. Upon inspection initially he did have significant callous overlying the area in question. It was not easily identifiable as far as any ulcer was concerned. With that being said he with this significant callous did require some sharp debridement and following debridement there did appear to be a small ulcer underlying. Fortunately patient in general shows no signs of infection at this point. He again has neuropathy and therefore does not have any ongoing discomfort or pain at this point. 07/12/17 on evaluation today patient actually appears to be doing very well in regard to his plantar foot ulcer. In fact this appears to be completely  healed and there's no residual opening at this time. Overall I'm very pleased in this regard. Patient also is extremely pleased. Readmission: 02/08/18 patient presents today for follow-up evaluation he has previously been seen here in our office for the same issue. Fortunately there is not been any evidence of infection since I last saw him he does note that he is been going to see his local podiatrist for callous pairing. During that time they actually noted that he had an open wound and subsequently referred him back ties for evaluation of this wound. He has done very well in the past with good wound care often healing in just a few weeks. No fevers, chills, nausea, or vomiting noted at this time. 02/15/18 on evaluation today patient actually appears to be doing very well in regard to his plantar foot ulcer. He is been tolerating the dressing changes without complication. There does appear to be some eschar of the line the wound surface. Fortunately I am happy with how things are progressing from the standpoint of the wound progress from last week to this week. 02/22/18 on evaluation today patient appears to be doing very well in regard to his plantar foot ulcer. He did have callous buildup around the area that required some sharp debridement today. He tolerated the debridement without complication. Post debridement the wound bed appears to be doing much better there's just a very tiny opening still remaining. 03/01/18 on evaluation today patient appears to be doing excellent in regard to his plantar  foot ulcer. In fact after I debrided away what appeared to be a dried blood clot on the surface and actually appear there was just a very small opening remaining in the central portion of the wound. This is literally a pinpoint region. Overall he has done well and I'm very pleased in this regard. I think that likely one more week will see this area completely healed. 03/08/18 on evaluation today patient  appears to actually be doing very well in regard to his plantar foot ulcer. He has been tolerating the dressing changes without complication. Fortunately this appears to be completely healed at this point which is excellent news. Overall I'm extremely happy with how things have progressed. 06/21/18 on evaluation today patient presents for evaluation in our clinic concerning issues that he's having with you seeing the area of his right foot that is previously experienced issues with. Fortunately there does not appear to be any evidence of infection at this time which is good news. With that being said he tells me that he has noted the callous buildup of the past several months but it was only in the past roughly week that he has noted drainage from the callous. He's not having any significant discomfort other than the fact that he states walking on the area with the extensive callous seems to be an issue. 06/28/18 on evaluation today patient actually appears to be doing very well in regard to his plantar foot ulcer. He's been [name], [name]. ([id number]) tolerating the dressing changes without complication. There is some callous buildup though not nearly as much as noted last week. Subsequently there is some need for sharp debridement today. Electronic Signature(s) Signed: 06/29/2018 12:15:24 AM By: Worthy Keeler PA-C Entered By: Worthy Keeler on 06/28/2018 09:08:40 [name], [name] ([id number]) -------------------------------------------------------------------------------- Physical Exam Details Patient Name: NARCISO, STOUTEN[place]G. Date of Service: 06/28/2018 8:00 AM Medical Record Number: [id number] Patient Account Number: [id number] Date of Birth/Sex: Jun 03, 1950 (68 y.o. M) Treating RN: Montey Hora Primary Care Provider: Benita Stabile Other Clinician: Referring Provider: TATE, Sharlet Salina Treating Provider/Extender: STONE III, HOYT Weeks in Treatment: 1 Constitutional Well-nourished and well-hydrated in  no acute distress. Respiratory normal breathing without difficulty. clear to auscultation bilaterally. Cardiovascular regular rate and rhythm with normal S1, S2. Psychiatric this patient is able to make decisions and demonstrates good insight into disease process. Alert and Oriented x 3. pleasant and cooperative. Notes Patient's wound bed currently showed signs of good granulation in the very core of the wound although there was still some Slough noted that require sharp debridement. This was removed along with the callous around the edge of the wound to get down to good subcutaneous tissue. He tolerated debridement today without complication fortunately no silver nitrate was necessary. Electronic Signature(s) Signed: 06/29/2018 12:15:24 AM By: Worthy Keeler PA-C Entered By: Worthy Keeler on 06/28/2018 09:09:09 Obryant, [name] ([id number]) -------------------------------------------------------------------------------- Physician Orders Details Patient Name: [name], [name]. Date of Service: 06/28/2018 8:00 AM Medical Record Number: [id number] Patient Account Number: [id number] Date of Birth/Sex: 01-26-1950 (68 y.o. M) Treating RN: Montey Hora Primary Care Provider: Benita Stabile Other Clinician: Referring Provider: Benita Stabile Treating Provider/Extender: Melburn Hake, HOYT Weeks in Treatment: 1 Verbal / Phone Orders: No Diagnosis Coding ICD-10 Coding Code Description E11.621 Type 2 diabetes mellitus with foot ulcer L97.512 Non-pressure chronic ulcer of other part of right foot with fat layer exposed I10 Essential (primary) hypertension I48.20 Chronic atrial fibrillation, unspecified Wound Cleansing Wound #4 Right,Plantar Foot o Clean wound  with Normal Saline. Anesthetic (add to Medication List) Wound #4 Right,Plantar Foot o Topical Lidocaine 4% cream applied to wound bed prior to debridement (In Clinic Only). Primary Wound Dressing Wound #4 Right,Plantar Foot o Silver  Alginate Secondary Dressing Wound #4 Right,Plantar Foot o Gauze and Kerlix/Conform Dressing Change Frequency Wound #4 Right,Plantar Foot o Change dressing every other day. Follow-up Appointments Wound #4 Right,Plantar Foot o Return Appointment in 1 week. Edema Control Wound #4 Right,Plantar Foot o Elevate legs to the level of the heart and pump ankles as often as possible Off-Loading Wound #4 Right,Plantar Foot o Open toe surgical shoe with peg assist. - right Additional Orders / Instructions [name], [name]. ([id number]) Wound #4 Right,Plantar Foot o Increase protein intake. Electronic Signature(s) Signed: 06/28/2018 1:16:30 PM By: Montey Hora Signed: 06/29/2018 12:15:24 AM By: Worthy Keeler PA-C Entered By: Montey Hora on 06/28/2018 08:36:02 [name], [name] ([id number]) -------------------------------------------------------------------------------- Problem List Details Patient Name: [name], [name]. Date of Service: 06/28/2018 8:00 AM Medical Record Number: [id number] Patient Account Number: [id number] Date of Birth/Sex: Jan 10, 1951 (68 y.o. M) Treating RN: Montey Hora Primary Care Provider: Benita Stabile Other Clinician: Referring Provider: Benita Stabile Treating Provider/Extender: Melburn Hake, HOYT Weeks in Treatment: 1 Active Problems ICD-10 Evaluated Encounter Code Description Active Date Today Diagnosis E11.621 Type 2 diabetes mellitus with foot ulcer 06/21/2018 No Yes L97.512 Non-pressure chronic ulcer of other part of right foot with fat 06/21/2018 No Yes layer exposed I10 Essential (primary) hypertension 06/21/2018 No Yes I48.20 Chronic atrial fibrillation, unspecified 06/21/2018 No Yes Inactive Problems Resolved Problems Electronic Signature(s) Signed: 06/29/2018 12:15:24 AM By: Worthy Keeler PA-C Entered By: Worthy Keeler on 06/28/2018 08:18:48 [name], [name]  ([id number]) -------------------------------------------------------------------------------- Progress Note Details Patient Name: [name]. Date of Service: 06/28/2018 8:00 AM Medical Record Number: [id number] Patient Account Number: [id number] Date of Birth/Sex: Mar 02, 1950 (68 y.o. M) Treating RN: Montey Hora Primary Care Provider: Benita Stabile Other Clinician: Referring Provider: Benita Stabile Treating Provider/Extender: Melburn Hake, HOYT Weeks in Treatment: 1 Subjective Chief Complaint Information obtained from Patient Right plantar foot ulcer History of Present Illness (HPI) The following HPI elements were documented for the patient's wound: Associated Signs and Symptoms: Patient has a history of diabetes mellitus type II, hypertension, and atrial fibrillation. He also has a hammertoe deformity of the bilateral feet which seems to be causing pressure in the ball of his foot. 03/09/17 on evaluation today patient presents for his initial evaluation concerning an ulcer on the plantar aspect of his right foot which has been open he tells me for about three weeks. He has been seen at [facility] where they did perform debridement it appears according to a note on 02/22/17 where unfortunately it appears that his callous area began to break down into an ulcer after modifications have been added to his insulin. This had happened previously as well. However I do not have details of the severity of debridement at that point although it does not sound as if you the debridement was too significant based on what the patient is telling me that he still had a lot of callous following debridement. With that being said they were going to look into altering his insoles to try to prevent further breakdown in the future. Nonetheless he has had foul odor discharge coming from the ulcer which is noted all the way back to that visit on 02/22/17 [facility]. Patient states that this was concerning  him more than anything else. He does  have diabetes although he described this as "borderline diabetes" he is on metoprolol however along with lisinopril. Patient is not having any issues with pain in regard to his right plantar foot. 03/16/17 on evaluation today patient appears to be doing much better in regard to his plantar foot ulcer. He actually tells me that he loves the peg assist offloading shoe and that he hasn't had any pain in the ulcer area from the callous since I worked on it last week. Overall he is extremely happy with how things have progressed. Likewise the wound bed has no slough noted there is no evidence of infection and it looks excellent. I did receive the results of his hemoglobin A1c which showed a value of 5.9 which was elevated but actually rather well. Subsequently I did also receive the x-ray of his foot which showed diffuse degenerative change but no underlying acute bony abnormality. 03/23/17 on evaluation today patient appears to be doing better in regard to his right plantar foot ulcer. He continues to show signs of improvement there's definitely not as much drainage at this point. He has been tolerating the dressing changes without complication he is not happy with the peg assist offloading shoe but at the same time I do believe that it is making good progress as far as offloading is concerned. I still believe he may need to talk to a surgeon about surgically correcting a hammertoe in order to avoid additional pressure to the site especially since he Artie has diabetic shoes and they do not seem to have prevented callous buildup in ulcer formation. 03/30/17 on evaluation today patient appears to be doing better in regard to his right plantar foot ulcer. Continues to show signs of improvement which is good news. Unfortunately we were unable to get the appointment with the orthopedic, Dr. Doran Durand, whom I recommended for him due to patient having a balance at the practice.  With that being said patient's foot ulcer does seem to be doing much better on evaluation today he still has some depth to the wound but overall we are seeing improvement and epithelialization week by week. Hopefully this is something that will close shortly. 04/05/17 on evaluation today patient unfortunately has what appears to be an area of the plantar surface of his ulcer where he had fluid collection where the callous grew over top of the wound bed. Unfortunately there was some pus like material noted during cleansing that we did sent for culture today. Hopefully this is not truly an infection is or does not appear to be any [name], [name]. ([id number]) evidence of erythema surrounding and maybe this is just simply a small setback with a fluid collection that has caused this issue. Nonetheless we will see what that shows when we get the culture back. I am also going to have it completely antibiotic which I previously placed him on anyway which will hopefully prevent any true infection from setting in. 04/12/17 on evaluation today patient's ulcer on the plantar aspect of his right foot actually appears to be better then during last weeks evaluation. In general he has been tolerating the dressing changes in utilizing the offloading shoe which does seem to be beneficial for him. With that being said he still seems to get some pressure to the area just not nearly as much as he previously had noted. Again fortunately there is no significant pain. 04/19/17 on evaluation today patient appears to be doing excellent in regard to his right foot ulcer. He has  been tolerating the dressing changes without complication. There really has not been any drainage over the past few days he tells me. With that being said he seems to be doing excellent in my opinion at this point he does have some callous buildup. 04/26/17 On evaluation today patient's wound appears to be completely healed which is great news. He has been  tolerating the dressing changes without complication and I do think he has done very well in regard to the healing process and offloading he has listed everything that that we instructed him to do. Obviously this appears to have paid off and he has progressed very nicely. Readmission: 07/05/17 on evaluation today patient is seen for fault evaluation and our clinic concerning the same issue that I have previously treated him for ending back in April 2019. This is a callous region with subsequently an ulcer underneath this on the plantar aspect of his right second metatarsal region. Fortunately he's not having any significant discomfort although his girlfriend told him that she noted an area of bruising at the site underneath the callous and wanted him to come get this checked out. He was very please with our care previous and therefore was more than happy to come let us check this for him. Upon inspection initially he did have significant callous overlying the area in question. It was not easily identifiable as far as any ulcer was concerned. With that being said he with this significant callous did require some sharp debridement and following debridement there did appear to be a small ulcer underlying. Fortunately patient in general shows no signs of infection at this point. He again has neuropathy and therefore does not have any ongoing discomfort or pain at this point. 07/12/17 on evaluation today patient actually appears to be doing very well in regard to his plantar foot ulcer. In fact this appears to be completely healed and there's no residual opening at this time. Overall I'm very pleased in this regard. Patient also is extremely pleased. Readmission: 02/08/18 patient presents today for follow-up evaluation he has previously been seen here in our office for the same issue. Fortunately there is not been any evidence of infection since I last saw him he does note that he is been going to see his  local podiatrist for callous pairing. During that time they actually noted that he had an open wound and subsequently referred him back ties for evaluation of this wound. He has done very well in the past with good wound care often healing in just a few weeks. No fevers, chills, nausea, or vomiting noted at this time. 02/15/18 on evaluation today patient actually appears to be doing very well in regard to his plantar foot ulcer. He is been tolerating the dressing changes without complication. There does appear to be some eschar of the line the wound surface. Fortunately I am happy with how things are progressing from the standpoint of the wound progress from last week to this week. 02/22/18 on evaluation today patient appears to be doing very well in regard to his plantar foot ulcer. He did have callous buildup around the area that required some sharp debridement today. He tolerated the debridement without complication. Post debridement the wound bed appears to be doing much better there's just a very tiny opening still remaining. 03/01/18 on evaluation today patient appears to be doing excellent in regard to his plantar foot ulcer. In fact after I debrided away what appeared to be a dried blood clot on  the surface and actually appear there was just a very small opening remaining in the central portion of the wound. This is literally a pinpoint region. Overall he has done well and I'm very pleased in this regard. I think that likely one more week will see this area completely healed. 03/08/18 on evaluation today patient appears to actually be doing very well in regard to his plantar foot ulcer. He has been tolerating the dressing changes without complication. Fortunately this appears to be completely healed at this point which is excellent news. Overall I'm extremely happy with how things have progressed. [name], [name] ([id number]) 06/21/18 on evaluation today patient presents for evaluation in our  clinic concerning issues that he's having with you seeing the area of his right foot that is previously experienced issues with. Fortunately there does not appear to be any evidence of infection at this time which is good news. With that being said he tells me that he has noted the callous buildup of the past several months but it was only in the past roughly week that he has noted drainage from the callous. He's not having any significant discomfort other than the fact that he states walking on the area with the extensive callous seems to be an issue. 06/28/18 on evaluation today patient actually appears to be doing very well in regard to his plantar foot ulcer. He's been tolerating the dressing changes without complication. There is some callous buildup though not nearly as much as noted last week. Subsequently there is some need for sharp debridement today. Patient History Information obtained from Patient. Family History Cancer - Siblings, Diabetes - Father, Heart Disease - Mother, Hypertension - Mother, No family history of Hereditary Spherocytosis, Kidney Disease, Lung Disease, Seizures, Stroke, Thyroid Problems, Tuberculosis. Social History Former smoker, Marital Status - Single, Alcohol Use - Never, Drug Use - No History, Caffeine Use - Moderate. Medical History Eyes Denies history of Cataracts, Glaucoma, Optic Neuritis Ear/Nose/Mouth/Throat Denies history of Chronic sinus problems/congestion, Middle ear problems Hematologic/Lymphatic Denies history of Anemia, Hemophilia, Human Immunodeficiency Virus, Lymphedema, Sickle Cell Disease Respiratory Denies history of Aspiration, Asthma, Chronic Obstructive Pulmonary Disease (COPD), Pneumothorax, Sleep Apnea, Tuberculosis Cardiovascular Patient has history of Arrhythmia - a fib, Hypertension Denies history of Angina, Congestive Heart Failure, Coronary Artery Disease, Deep Vein Thrombosis, Hypotension, Myocardial Infarction, Peripheral  Arterial Disease, Peripheral Venous Disease, Phlebitis, Vasculitis Gastrointestinal Denies history of Cirrhosis , Colitis, Crohn s, Hepatitis A, Hepatitis B, Hepatitis C Endocrine Patient has history of Type II Diabetes Denies history of Type I Diabetes Genitourinary Denies history of End Stage Renal Disease Immunological Denies history of Lupus Erythematosus, Raynaud s, Scleroderma Integumentary (Skin) Denies history of History of Burn, History of pressure wounds Musculoskeletal Patient has history of Osteoarthritis Denies history of Gout, Rheumatoid Arthritis, Osteomyelitis Neurologic Denies history of Dementia, Neuropathy, Quadriplegia, Paraplegia, Seizure Disorder Oncologic Denies history of Received Chemotherapy, Received Radiation Medical And Surgical History Notes Cardiovascular High Cholesterol Review of Systems (ROS) Blasius, Zachry J. ([id number]) Constitutional Symptoms (General Health) Denies complaints or symptoms of Fatigue, Fever, Chills, Marked Weight Change. Respiratory Denies complaints or symptoms of Chronic or frequent coughs, Shortness of Breath. Cardiovascular Denies complaints or symptoms of Chest pain, LE edema. Psychiatric Denies complaints or symptoms of Anxiety, Claustrophobia. Objective Constitutional Well-nourished and well-hydrated in no acute distress. Vitals Time Taken: 8:14 AM, Height: 74 in, Weight: 233 lbs, BMI: 29.9, Temperature: 98.0 F, Pulse: 67 bpm, Respiratory Rate: 16 breaths/min, Blood Pressure: 149/79 mmHg. Respiratory normal breathing without difficulty.  clear to auscultation bilaterally. Cardiovascular regular rate and rhythm with normal S1, S2. Psychiatric this patient is able to make decisions and demonstrates good insight into disease process. Alert and Oriented x 3. pleasant and cooperative. General Notes: Patient's wound bed currently showed signs of good granulation in the very core of the wound although there was still  some Slough noted that require sharp debridement. This was removed along with the callous around the edge of the wound to get down to good subcutaneous tissue. He tolerated debridement today without complication fortunately no silver nitrate was necessary. Integumentary (Hair, Skin) Wound #4 status is Open. Original cause of wound was Pressure Injury. The wound is located on the [place]. The wound measures 0.3cm length x 0.4cm width x 0.2cm depth; 0.094cm^2 area and 0.019cm^3 volume. There is Fat Layer (Subcutaneous Tissue) Exposed exposed. There is no tunneling or undermining noted. There is a medium amount of sanguinous drainage noted. The wound margin is indistinct and nonvisible. There is small (1-33%) red granulation within the wound bed. There is a medium (34-66%) amount of necrotic tissue within the wound bed including Eschar. Assessment Active Problems ICD-10 Type 2 diabetes mellitus with foot ulcer Dinsmore, Jaire J. ([id number]) Non-pressure chronic ulcer of other part of right foot with fat layer exposed Essential (primary) hypertension Chronic atrial fibrillation, unspecified Procedures Wound #4 Pre-procedure diagnosis of Wound #4 is a Diabetic Wound/Ulcer of the Lower Extremity located on the [place] .Severity of Tissue Pre Debridement is: Fat layer exposed. There was a Excisional Skin/Subcutaneous Tissue Debridement with a total area of 0.12 sq cm performed by STONE III, HOYT E., PA-C. With the following instrument(s): Curette to remove Viable and Non-Viable tissue/material. Material removed includes Callus, Subcutaneous Tissue, and Slough after achieving pain control using Lidocaine 4% Topical Solution. No specimens were taken. A time out was conducted at 08:26, prior to the start of the procedure. A Minimum amount of bleeding was controlled with Pressure. The procedure was tolerated well with a pain level of 0 throughout and a pain level of 0 following the  procedure. Post Debridement Measurements: 0.5cm length x 0.7cm width x 0.4cm depth; 0.11cm^3 volume. Character of Wound/Ulcer Post Debridement is improved. Severity of Tissue Post Debridement is: Fat layer exposed. Post procedure Diagnosis Wound #4: Same as Pre-Procedure Plan Wound Cleansing: Wound #4 Right,Plantar Foot: Clean wound with Normal Saline. Anesthetic (add to Medication List): Wound #4 Right,Plantar Foot: Topical Lidocaine 4% cream applied to wound bed prior to debridement (In Clinic Only). Primary Wound Dressing: Wound #4 Right,Plantar Foot: Silver Alginate Secondary Dressing: Wound #4 Right,Plantar Foot: Gauze and Kerlix/Conform Dressing Change Frequency: Wound #4 Right,Plantar Foot: Change dressing every other day. Follow-up Appointments: Wound #4 Right,Plantar Foot: Return Appointment in 1 week. Edema Control: Wound #4 Right,Plantar Foot: Elevate legs to the level of the heart and pump ankles as often as possible Off-Loading: Wound #4 Right,Plantar Foot: Open toe surgical shoe with peg assist. - right Additional Orders / Instructions: Wound #4 Right,Plantar Foot: Increase protein intake. [name], [name] ([id number]) My suggestion at this point is gonna be that we continue with the above wound care measures for the next week. The patient is in agreement with that plan. We will subsequently see were things stand at follow-up. If anything changes or worsens meantime he will contact the office and let us know. Please see above for specific wound care orders. We will see patient for re-evaluation in 1 week(s) here in the clinic. If anything worsens or changes patient  will contact our office for additional recommendations. Electronic Signature(s) Signed: 06/29/2018 12:15:24 AM By: Worthy Keeler PA-C Entered By: Worthy Keeler on 06/28/2018 09:09:21 [name]  ([id number]) -------------------------------------------------------------------------------- ROS/PFSH Details Patient Name: PENG, THORSTENSON. Date of Service: 06/28/2018 8:00 AM Medical Record Number: [id number] Patient Account Number: [id number] Date of Birth/Sex: 1950/04/22 (68 y.o. M) Treating RN: Montey Hora Primary Care Provider: Benita Stabile Other Clinician: Referring Provider: Benita Stabile Treating Provider/Extender: STONE III, HOYT Weeks in Treatment: 1 Information Obtained From Patient Constitutional Symptoms (General Health) Complaints and Symptoms: Negative for: Fatigue; Fever; Chills; Marked Weight Change Respiratory Complaints and Symptoms: Negative for: Chronic or frequent coughs; Shortness of Breath Medical History: Negative for: Aspiration; Asthma; Chronic Obstructive Pulmonary Disease (COPD); Pneumothorax; Sleep Apnea; Tuberculosis Cardiovascular Complaints and Symptoms: Negative for: Chest pain; LE edema Medical History: Positive for: Arrhythmia - a fib; Hypertension Negative for: Angina; Congestive Heart Failure; Coronary Artery Disease; Deep Vein Thrombosis; Hypotension; Myocardial Infarction; Peripheral Arterial Disease; Peripheral Venous Disease; Phlebitis; Vasculitis Past Medical History Notes: High Cholesterol Psychiatric Complaints and Symptoms: Negative for: Anxiety; Claustrophobia Eyes Medical History: Negative for: Cataracts; Glaucoma; Optic Neuritis Ear/Nose/Mouth/Throat Medical History: Negative for: Chronic sinus problems/congestion; Middle ear problems Hematologic/Lymphatic Medical History: Negative for: Anemia; Hemophilia; Human Immunodeficiency Virus; Lymphedema; Sickle Cell Disease Gastrointestinal MEDARDO, HASSING. ([id number]) Medical History: Negative for: Cirrhosis ; Colitis; Crohnos; Hepatitis A; Hepatitis B; Hepatitis C Endocrine Medical History: Positive for: Type II Diabetes Negative for: Type I Diabetes Time with diabetes: 3  years Treated with: Oral agents Blood sugar tested every day: No Genitourinary Medical History: Negative for: End Stage Renal Disease Immunological Medical History: Negative for: Lupus Erythematosus; Raynaudos; Scleroderma Integumentary (Skin) Medical History: Negative for: History of Burn; History of pressure wounds Musculoskeletal Medical History: Positive for: Osteoarthritis Negative for: Gout; Rheumatoid Arthritis; Osteomyelitis Neurologic Medical History: Negative for: Dementia; Neuropathy; Quadriplegia; Paraplegia; Seizure Disorder Oncologic Medical History: Negative for: Received Chemotherapy; Received Radiation Immunizations Pneumococcal Vaccine: Received Pneumococcal Vaccination: Yes Immunization Notes: up to date Implantable Devices No devices added Family and Social History Cancer: Yes - Siblings; Diabetes: Yes - Father; Heart Disease: Yes - Mother; Hereditary Spherocytosis: No; Hypertension: Yes - Mother; Kidney Disease: No; Lung Disease: No; Seizures: No; Stroke: No; Thyroid Problems: No; Tuberculosis: No; Former smoker; Marital Status - Single; Alcohol Use: Never; Drug Use: No History; Caffeine Use: Moderate; Financial Concerns: No; Food, Clothing or Shelter Needs: No; Support System Lacking: No; Transportation Concerns: No TAKUYA, LARICCIA ([id number]) Physician Affirmation I have reviewed and agree with the above information. Electronic Signature(s) Signed: 06/28/2018 1:16:30 PM By: Montey Hora Signed: 06/29/2018 12:15:24 AM By: Worthy Keeler PA-C Entered By: Worthy Keeler on 06/28/2018 09:08:55 Valko, [name] ([id number]) -------------------------------------------------------------------------------- SuperBill Details Patient Name: OREN, BARELLA. Date of Service: 06/28/2018 Medical Record Number: [id number] Patient Account Number: [id number] Date of Birth/Sex: June 26, 1950 (68 y.o. M) Treating RN: Montey Hora Primary Care Provider: Benita Stabile  Other Clinician: Referring Provider: Benita Stabile Treating Provider/Extender: Melburn Hake, HOYT Weeks in Treatment: 1 Diagnosis Coding ICD-10 Codes Code Description E11.621 Type 2 diabetes mellitus with foot ulcer L97.512 Non-pressure chronic ulcer of other part of right foot with fat layer exposed I10 Essential (primary) hypertension I48.20 Chronic atrial fibrillation, unspecified Facility Procedures CPT4 Code: [id number] Description: 62703 - DEB SUBQ TISSUE 20 SQ CM/< ICD-10 Diagnosis Description L97.512 Non-pressure chronic ulcer of other part of right foot with fa Modifier: t layer exposed Quantity: 1 Physician Procedures CPT4 Code: [id number] Description: 11042 - WC PHYS SUBQ  TISS 20 SQ CM ICD-10 Diagnosis Description L97.512 Non-pressure chronic ulcer of other part of right foot with fa Modifier: t layer exposed Quantity: 1 Electronic Signature(s) Signed: 06/29/2018 12:15:24 AM By: Worthy Keeler PA-C Entered By: Worthy Keeler on 06/28/2018 09:09:36

## 2018-07-05 ENCOUNTER — Other Ambulatory Visit: Payer: Self-pay

## 2018-07-05 ENCOUNTER — Encounter: Payer: PPO | Admitting: Physician Assistant

## 2018-07-05 DIAGNOSIS — L97512 Non-pressure chronic ulcer of other part of right foot with fat layer exposed: Secondary | ICD-10-CM | POA: Diagnosis not present

## 2018-07-05 DIAGNOSIS — E11621 Type 2 diabetes mellitus with foot ulcer: Secondary | ICD-10-CM | POA: Diagnosis not present

## 2018-07-06 NOTE — Progress Notes (Signed)
JOREL, GRAVLIN ([id number]) Visit Report for 07/05/2018 Arrival Information Details Patient Name: [name], [name]. Date of Service: 07/05/2018 9:00 AM Medical Record Number: [id number] Patient Account Number: [id number] Date of Birth/Sex: Jun 01, 1950 (68 y.o. M) Treating RN: Cornell Barman Primary Care Reanna Scoggin: Benita Stabile Other Clinician: Referring Donelle Hise: Benita Stabile Treating Moni Rothrock/Extender: Melburn Hake, HOYT Weeks in Treatment: 2 Visit Information History Since Last Visit Added or deleted any medications: No Patient Arrived: Ambulatory Any new allergies or adverse reactions: No Arrival Time: 09:01 Had a fall or experienced change in No Accompanied By: self activities of daily living that may affect Transfer Assistance: None risk of falls: Patient Identification Verified: Yes Signs or symptoms of abuse/neglect since last No Secondary Verification Process Completed: Yes visito Hospitalized since last visit: No Implantable device outside of the clinic No excluding cellular tissue based products placed in the center since last visit: Has Footwear/Offloading in Place as Yes Prescribed: Right: Surgical Shoe with Pressure Relief Insole Pain Present Now: No Electronic Signature(s) Signed: 07/05/2018 11:56:50 AM By: Gretta Cool, BSN, RN, CWS, Kim RN, BSN Entered By: Gretta Cool, BSN, RN, CWS, Kim on 07/05/2018 09:01:54 Keiffer, [name] ([id number]) -------------------------------------------------------------------------------- Encounter Discharge Information Details Patient Name: [name], [name]. Date of Service: 07/05/2018 9:00 AM Medical Record Number: [id number] Patient Account Number: [id number] Date of Birth/Sex: 1950-10-30 (68 y.o. M) Treating RN: Cornell Barman Primary Care Alethia Melendrez: Benita Stabile Other Clinician: Referring Laylia Mui: Benita Stabile Treating Qamar Rosman/Extender: Melburn Hake, HOYT Weeks in Treatment: 2 Encounter Discharge Information Items Post Procedure Vitals Discharge  Condition: Stable Temperature (F): 98.1 Ambulatory Status: Ambulatory Pulse (bpm): 128 Discharge Destination: Home Respiratory Rate (breaths/min): 16 Transportation: Private Auto Blood Pressure (mmHg): 118/82 Accompanied By: self Schedule Follow-up Appointment: Yes Clinical Summary of Care: Notes Patient has A-fib. Electronic Signature(s) Signed: 07/05/2018 11:56:50 AM By: Gretta Cool, BSN, RN, CWS, Kim RN, BSN Entered By: Gretta Cool, BSN, RN, CWS, Kim on 07/05/2018 09:56:55 Kocian, [name] ([id number]) -------------------------------------------------------------------------------- Lower Extremity Assessment Details Patient Name: [name], [name]. Date of Service: 07/05/2018 9:00 AM Medical Record Number: [id number] Patient Account Number: [id number] Date of Birth/Sex: 06-13-1950 (68 y.o. M) Treating RN: Cornell Barman Primary Care Annina Piotrowski: Benita Stabile Other Clinician: Referring Ziyah Cordoba: Benita Stabile Treating Rollyn Scialdone/Extender: STONE III, HOYT Weeks in Treatment: 2 Edema Assessment Assessed: [Left: No] [Right: No] Edema: [Left: N] [Right: o] Vascular Assessment Pulses: Dorsalis Pedis Palpable: [Right:Yes] Posterior Tibial Palpable: [Right:Yes] Electronic Signature(s) Signed: 07/05/2018 11:56:50 AM By: Gretta Cool, BSN, RN, CWS, Kim RN, BSN Entered By: Gretta Cool, BSN, RN, CWS, Kim on 07/05/2018 09:09:31 Markus, [name] ([id number]) -------------------------------------------------------------------------------- Multi Wound Chart Details Patient Name: [name], [name]. Date of Service: 07/05/2018 9:00 AM Medical Record Number: [id number] Patient Account Number: [id number] Date of Birth/Sex: Feb 02, 1950 (68 y.o. M) Treating RN: Montey Hora Primary Care Tyrah Broers: Benita Stabile Other Clinician: Referring Augustus Zurawski: Benita Stabile Treating Michel Hendon/Extender: STONE III, HOYT Weeks in Treatment: 2 Vital Signs Height(in): 74 Pulse(bpm): 128 Weight(lbs): 233 Blood Pressure(mmHg): 118/82 Body Mass  Index(BMI): 30 Temperature(F): 98.2 Respiratory Rate 16 (breaths/min): Photos: [N/A:N/A] Wound Location: Right Foot - Plantar N/A N/A Wounding Event: Pressure Injury N/A N/A Primary Etiology: Diabetic Wound/Ulcer of the N/A N/A Lower Extremity Secondary Etiology: Pressure Ulcer N/A N/A Comorbid History: Arrhythmia, Hypertension, N/A N/A Type II Diabetes, Osteoarthritis Date Acquired: 06/13/2018 N/A N/A Weeks of Treatment: 2 N/A N/A Wound Status: Open N/A N/A Measurements L x W x D 0.3x0.3x0.3 N/A N/A (cm) Area (cm) : 0.071 N/A N/A Volume (cm) : 0.021 N/A N/A % Reduction in Area: 98.50% N/A N/A %  Reduction in Volume: 99.60% N/A N/A Classification: Grade 2 N/A N/A Exudate Amount: Medium N/A N/A Exudate Type: Sanguinous N/A N/A Exudate Color: red N/A N/A Wound Margin: Indistinct, nonvisible N/A N/A Granulation Amount: Small (1-33%) N/A N/A Granulation Quality: Red N/A N/A Necrotic Amount: Medium (34-66%) N/A N/A Necrotic Tissue: Eschar N/A N/A Exposed Structures: Fat Layer (Subcutaneous N/A N/A Tissue) Exposed: Yes Fascia: No Hesse, Gabrian J. ([id number]) Tendon: No Muscle: No Joint: No Bone: No Epithelialization: Small (1-33%) N/A N/A Treatment Notes Electronic Signature(s) Signed: 07/05/2018 4:26:15 PM By: Montey Hora Entered By: Montey Hora on 07/05/2018 09:41:58 Hlavac, [name] ([id number]) -------------------------------------------------------------------------------- Multi-Disciplinary Care Plan Details Patient Name: [name]. Date of Service: 07/05/2018 9:00 AM Medical Record Number: [id number] Patient Account Number: [id number] Date of Birth/Sex: 08/12/1950 (68 y.o. M) Treating RN: Montey Hora Primary Care Michale Weikel: Benita Stabile Other Clinician: Referring Marlow Berenguer: Benita Stabile Treating Cliffard Hair/Extender: Melburn Hake, HOYT Weeks in Treatment: 2 Active Inactive Necrotic Tissue Nursing Diagnoses: Impaired tissue integrity related to  necrotic/devitalized tissue Goals: Necrotic/devitalized tissue will be minimized in the wound bed Date Initiated: 06/21/2018 Target Resolution Date: 06/21/2018 Goal Status: Active Patient/caregiver will verbalize understanding of reason and process for debridement of necrotic tissue Date Initiated: 06/21/2018 Target Resolution Date: 06/21/2018 Goal Status: Active Interventions: Assess patient pain level pre-, during and post procedure and prior to discharge Treatment Activities: Apply topical anesthetic as ordered : 06/21/2018 Notes: Orientation to the Wound Care Program Nursing Diagnoses: Knowledge deficit related to the wound healing center program Goals: Patient/caregiver will verbalize understanding of the [facility] Date Initiated: 06/21/2018 Target Resolution Date: 06/21/2018 Goal Status: Active Interventions: Provide education on orientation to the wound center Notes: Pressure Nursing Diagnoses: Knowledge deficit related to management of pressures ulcers Goals: DOUGLES, KIMMEY ([id number]) Patient will remain free from development of additional pressure ulcers Date Initiated: 06/21/2018 Target Resolution Date: 06/29/2018 Goal Status: Active Interventions: Assess: immobility, friction, shearing, incontinence upon admission and as needed Provide education on pressure ulcers Notes: Wound/Skin Impairment Nursing Diagnoses: Impaired tissue integrity Goals: Ulcer/skin breakdown will have a volume reduction of 30% by week 4 Date Initiated: 06/21/2018 Target Resolution Date: 07/21/2018 Goal Status: Active Interventions: Assess patient/caregiver ability to perform ulcer/skin care regimen upon admission and as needed Assess ulceration(s) every visit Treatment Activities: Referred to DME Carita Sollars for dressing supplies : 06/21/2018 Skin care regimen initiated : 06/21/2018 Topical wound management initiated : 06/21/2018 Notes: Electronic Signature(s) Signed: 07/05/2018 4:26:15  PM By: Montey Hora Entered By: Montey Hora on 07/05/2018 09:41:44 Kassel, [name] ([id number]) -------------------------------------------------------------------------------- Pain Assessment Details Patient Name: [name]. Date of Service: 07/05/2018 9:00 AM Medical Record Number: [id number] Patient Account Number: [id number] Date of Birth/Sex: Feb 22, 1950 (68 y.o. M) Treating RN: Cornell Barman Primary Care Sadye Kiernan: Benita Stabile Other Clinician: Referring Paeton Studer: Benita Stabile Treating Ardath Lepak/Extender: Melburn Hake, HOYT Weeks in Treatment: 2 Active Problems Location of Pain Severity and Description of Pain Patient Has Paino No Site Locations Pain Management and Medication Current Pain Management: Notes Patient denies pain at this time. Electronic Signature(s) Signed: 07/05/2018 11:56:50 AM By: Gretta Cool, BSN, RN, CWS, Kim RN, BSN Entered By: Gretta Cool, BSN, RN, CWS, Kim on 07/05/2018 09:02:09 DREVON, PLOG ([id number]) -------------------------------------------------------------------------------- Patient/Caregiver Education Details Patient Name: [name], [name]. Date of Service: 07/05/2018 9:00 AM Medical Record Number: [id number] Patient Account Number: [id number] Date of Birth/Gender: April 10, 1950 (68 y.o. M) Treating RN: Montey Hora Primary Care Physician: Benita Stabile Other Clinician: Referring Physician: Benita Stabile Treating Physician/Extender: STONE III, HOYT Weeks in Treatment:  2 Education Assessment Education Provided To: Patient Education Topics Provided Wound/Skin Impairment: Handouts: Other: continue with offloading shoe Methods: Explain/Verbal Responses: State content correctly Electronic Signature(s) Signed: 07/05/2018 4:26:15 PM By: Montey Hora Entered By: Montey Hora on 07/05/2018 10:17:20 Ratledge, [name] ([id number]) -------------------------------------------------------------------------------- Wound Assessment Details Patient Name: [name]. Date of Service: 07/05/2018 9:00 AM Medical Record Number: [id number] Patient Account Number: [id number] Date of Birth/Sex: 12-12-1950 (68 y.o. M) Treating RN: Cornell Barman Primary Care Deepa Barthel: Benita Stabile Other Clinician: Referring Tylasia Fletchall: Benita Stabile Treating Lanessa Shill/Extender: Melburn Hake, HOYT Weeks in Treatment: 2 Wound Status Wound Number: 4 Primary Etiology: Diabetic Wound/Ulcer of the Lower Extremity Wound Location: Right Foot - Plantar Secondary Pressure Ulcer Wounding Event: Pressure Injury Etiology: Date Acquired: 06/13/2018 Wound Status: Open Weeks Of Treatment: 2 Comorbid Arrhythmia, Hypertension, Type II Diabetes, Clustered Wound: No History: Osteoarthritis Photos Photo Uploaded By: Army Melia on 07/05/2018 09:15:54 Wound Measurements Length: (cm) 0.3 Width: (cm) 0.3 Depth: (cm) 0.3 Area: (cm) 0.071 Volume: (cm) 0.021 % Reduction in Area: 98.5% % Reduction in Volume: 99.6% Epithelialization: Small (1-33%) Tunneling: No Undermining: No Wound Description Classification: Grade 2 Foul Od Wound Margin: Indistinct, nonvisible Slough/ Exudate Amount: Medium Exudate Type: Sanguinous Exudate Color: red or After Cleansing: No Fibrino No Wound Bed Granulation Amount: Small (1-33%) Exposed Structure Granulation Quality: Red Fascia Exposed: No Necrotic Amount: Medium (34-66%) Fat Layer (Subcutaneous Tissue) Exposed: Yes Necrotic Quality: Eschar Tendon Exposed: No Muscle Exposed: No Joint Exposed: No Bone Exposed: No Koebel, Chord JMarland Kitchen ([id number]) Treatment Notes Wound #4 (Right, Plantar Foot) Notes 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 Date of  Service: 07/05/2018 9:00 AM Medical Record Number: [id number] Patient Account Number: [id number] Date of Birth/Sex: July 17, 1950 (68 y.o. M) Treating RN: Cornell Barman Primary Care Yicel Shannon: Benita Stabile Other Clinician: Referring Breigh Annett: Benita Stabile Treating Araya Roel/Extender: Melburn Hake, HOYT Weeks in Treatment: 2 Vital Signs Time Taken: 09:02 Temperature (F): 98.2 Height (in): 74 Pulse (bpm): 128 Weight (lbs): 233 Respiratory Rate (breaths/min): 16 Body Mass Index (BMI): 29.9 Blood Pressure (mmHg): 118/82 Reference Range: 80 - 120 mg / dl Electronic Signature(s) Signed: 07/05/2018 11:56:50 AM By: Gretta Cool, BSN, RN, CWS, Kim RN, BSN Entered By: Gretta Cool, BSN, RN, CWS, Kim on 07/05/2018 09:06:34

## 2018-07-07 NOTE — Progress Notes (Signed)
YOREL, REDDER ([id number]) Visit Report for 07/05/2018 Chief Complaint Document Details Patient Name: [name], [name]. Date of Service: 07/05/2018 9:00 AM Medical Record Number: [id number] Patient Account Number: [id number] Date of Birth/Sex: 1950/12/20 (68 y.o. M) Treating RN: Montey Hora Primary Care Provider: Benita Stabile Other Clinician: Referring Provider: Benita Stabile Treating Provider/Extender: Melburn Hake, HOYT Weeks in Treatment: 2 Information Obtained from: Patient Chief Complaint Right plantar foot ulcer Electronic Signature(s) Signed: 07/06/2018 6:29:07 AM By: Worthy Keeler PA-C Entered By: Worthy Keeler on 07/05/2018 09:18:50 Boan, [name] ([id number]) -------------------------------------------------------------------------------- Debridement Details Patient Name: [name]. Date of Service: 07/05/2018 9:00 AM Medical Record Number: [id number] Patient Account Number: [id number] Date of Birth/Sex: [birth year] (68 y.o. M) Treating RN: Montey Hora Primary Care Provider: Benita Stabile Other Clinician: Referring Provider: Benita Stabile Treating Provider/Extender: STONE III, HOYT Weeks in Treatment: 2 Debridement Performed for Wound #4 Right,Plantar Foot Assessment: Performed By: Physician STONE III, HOYT E., PA-C Debridement Type: Debridement Severity of Tissue Pre Fat layer exposed Debridement: Level of Consciousness (Pre- Awake and Alert procedure): Pre-procedure Verification/Time Yes - 09:42 Out Taken: Start Time: 09:42 Pain Control: Lidocaine 4% Topical Solution Total Area Debrided (L x W): 0.3 (cm) x 0.3 (cm) = 0.09 (cm) Tissue and other material Viable, Non-Viable, Callus, Slough, Subcutaneous, Slough debrided: Level: Skin/Subcutaneous Tissue Debridement Description: Excisional Instrument: Curette Bleeding: Minimum Hemostasis Achieved: Pressure End Time: 09:48 Procedural Pain: 0 Post Procedural Pain: 0 Response to Treatment: Procedure was  tolerated well Level of Consciousness Awake and Alert (Post-procedure): Post Debridement Measurements of Total Wound Length: (cm) 0.4 Width: (cm) 0.4 Depth: (cm) 0.2 Volume: (cm) 0.025 Character of Wound/Ulcer Post Debridement: Improved Severity of Tissue Post Debridement: Fat layer exposed Post Procedure Diagnosis Same as Pre-procedure Electronic Signature(s) Signed: 07/05/2018 4:26:15 PM By: Montey Hora Signed: 07/06/2018 6:29:07 AM By: Worthy Keeler PA-C Entered By: Montey Hora on 07/05/2018 09:48:58 Ninneman, [name] ([id number]) -------------------------------------------------------------------------------- HPI Details Patient Name: [name]. Date of Service: 07/05/2018 9:00 AM Medical Record Number: [id number] Patient Account Number: [id number] Date of Birth/Sex: 01-28-1950 (68 y.o. M) Treating RN: Montey Hora Primary Care Provider: Benita Stabile Other Clinician: Referring Provider: Benita Stabile Treating Provider/Extender: Melburn Hake, HOYT Weeks in Treatment: 2 History of Present Illness Associated Signs and Symptoms: Patient has a history of diabetes mellitus type II, hypertension, and atrial fibrillation. He also has a hammertoe deformity of the bilateral feet which seems to be causing pressure in the ball of his foot. HPI Description: 03/09/17 on evaluation today patient presents for his initial evaluation concerning an ulcer on the plantar aspect of his right foot which has been open he tells me for about three weeks. He has been seen at [facility] where they did perform debridement it appears according to a note on 02/22/17 where unfortunately it appears that his callous area began to break down into an ulcer after modifications have been added to his insulin. This had happened previously as well. However I do not have details of the severity of debridement at that point although it does not sound as if you the debridement was too significant based on  what the patient is telling me that he still had a lot of callous following debridement. With that being said they were going to look into altering his insoles to try to prevent further breakdown in the future. Nonetheless he has had foul odor discharge coming from the ulcer which is noted all the way back to that visit on 02/22/17  [facility]. Patient states that this was concerning him more than anything else. He does have diabetes although he described this as "borderline diabetes" he is on metoprolol however along with lisinopril. Patient is not having any issues with pain in regard to his right plantar foot. 03/16/17 on evaluation today patient appears to be doing much better in regard to his plantar foot ulcer. He actually tells me that he loves the peg assist offloading shoe and that he hasn't had any pain in the ulcer area from the callous since I worked on it last week. Overall he is extremely happy with how things have progressed. Likewise the wound bed has no slough noted there is no evidence of infection and it looks excellent. I did receive the results of his hemoglobin A1c which showed a value of 5.9 which was elevated but actually rather well. Subsequently I did also receive the x-ray of his foot which showed diffuse degenerative change but no underlying acute bony abnormality. 03/23/17 on evaluation today patient appears to be doing better in regard to his right plantar foot ulcer. He continues to show signs of improvement there's definitely not as much drainage at this point. He has been tolerating the dressing changes without complication he is not happy with the peg assist offloading shoe but at the same time I do believe that it is making good progress as far as offloading is concerned. I still believe he may need to talk to a surgeon about surgically correcting a hammertoe in order to avoid additional pressure to the site especially since he Artie has diabetic shoes and they  do not seem to have prevented callous buildup in ulcer formation. 03/30/17 on evaluation today patient appears to be doing better in regard to his right plantar foot ulcer. Continues to show signs of improvement which is good news. Unfortunately we were unable to get the appointment with the orthopedic, Dr. Doran Durand, whom I recommended for him due to patient having a balance at the practice. With that being said patient's foot ulcer does seem to be doing much better on evaluation today he still has some depth to the wound but overall we are seeing improvement and epithelialization week by week. Hopefully this is something that will close shortly. 04/05/17 on evaluation today patient unfortunately has what appears to be an area of the plantar surface of his ulcer where he had fluid collection where the callous grew over top of the wound bed. Unfortunately there was some pus like material noted during cleansing that we did sent for culture today. Hopefully this is not truly an infection is or does not appear to be any evidence of erythema surrounding and maybe this is just simply a small setback with a fluid collection that has caused this issue. Nonetheless we will see what that shows when we get the culture back. I am also going to have it completely antibiotic which I previously placed him on anyway which will hopefully prevent any true infection from setting in. 04/12/17 on evaluation today patient's ulcer on the plantar aspect of his right foot actually appears to be better then during last weeks evaluation. In general he has been tolerating the dressing changes in utilizing the offloading shoe which does seem to be beneficial for him. With that being said he still seems to get some pressure to the area just not nearly as much as he previously had noted. Again fortunately there is no significant pain. BRAE, GARTMAN ([id number]) 04/19/17 on  evaluation today patient appears to be doing excellent in  regard to his right foot ulcer. He has been tolerating the dressing changes without complication. There really has not been any drainage over the past few days he tells me. With that being said he seems to be doing excellent in my opinion at this point he does have some callous buildup. 04/26/17 On evaluation today patient's wound appears to be completely healed which is great news. He has been tolerating the dressing changes without complication and I do think he has done very well in regard to the healing process and offloading he has listed everything that that we instructed him to do. Obviously this appears to have paid off and he has progressed very nicely. Readmission: 07/05/17 on evaluation today patient is seen for fault evaluation and our clinic concerning the same issue that I have previously treated him for ending back in April 2019. This is a callous region with subsequently an ulcer underneath this on the plantar aspect of his right second metatarsal region. Fortunately he's not having any significant discomfort although his girlfriend told him that she noted an area of bruising at the site underneath the callous and wanted him to come get this checked out. He was very please with our care previous and therefore was more than happy to come let us check this for him. Upon inspection initially he did have significant callous overlying the area in question. It was not easily identifiable as far as any ulcer was concerned. With that being said he with this significant callous did require some sharp debridement and following debridement there did appear to be a small ulcer underlying. Fortunately patient in general shows no signs of infection at this point. He again has neuropathy and therefore does not have any ongoing discomfort or pain at this point. 07/12/17 on evaluation today patient actually appears to be doing very well in regard to his plantar foot ulcer. In fact this appears to be  completely healed and there's no residual opening at this time. Overall I'm very pleased in this regard. Patient also is extremely pleased. Readmission: 02/08/18 patient presents today for follow-up evaluation he has previously been seen here in our office for the same issue. Fortunately there is not been any evidence of infection since I last saw him he does note that he is been going to see his local podiatrist for callous pairing. During that time they actually noted that he had an open wound and subsequently referred him back ties for evaluation of this wound. He has done very well in the past with good wound care often healing in just a few weeks. No fevers, chills, nausea, or vomiting noted at this time. 02/15/18 on evaluation today patient actually appears to be doing very well in regard to his plantar foot ulcer. He is been tolerating the dressing changes without complication. There does appear to be some eschar of the line the wound surface. Fortunately I am happy with how things are progressing from the standpoint of the wound progress from last week to this week. 02/22/18 on evaluation today patient appears to be doing very well in regard to his plantar foot ulcer. He did have callous buildup around the area that required some sharp debridement today. He tolerated the debridement without complication. Post debridement the wound bed appears to be doing much better there's just a very tiny opening still remaining. 03/01/18 on evaluation today patient appears to be doing excellent in regard to his plantar  foot ulcer. In fact after I debrided away what appeared to be a dried blood clot on the surface and actually appear there was just a very small opening remaining in the central portion of the wound. This is literally a pinpoint region. Overall he has done well and I'm very pleased in this regard. I think that likely one more week will see this area completely healed. 03/08/18 on evaluation  today patient appears to actually be doing very well in regard to his plantar foot ulcer. He has been tolerating the dressing changes without complication. Fortunately this appears to be completely healed at this point which is excellent news. Overall I'm extremely happy with how things have progressed. 06/21/18 on evaluation today patient presents for evaluation in our clinic concerning issues that he's having with you seeing the area of his right foot that is previously experienced issues with. Fortunately there does not appear to be any evidence of infection at this time which is good news. With that being said he tells me that he has noted the callous buildup of the past several months but it was only in the past roughly week that he has noted drainage from the callous. He's not having any significant discomfort other than the fact that he states walking on the area with the extensive callous seems to be an issue. 06/28/18 on evaluation today patient actually appears to be doing very well in regard to his plantar foot ulcer. He's been tolerating the dressing changes without complication. There is some callous buildup though not nearly as much as noted last ROMELL, WOLDEN. ([id number]) week. Subsequently there is some need for sharp debridement today. 07/05/18 on evaluation today patient appears to be doing much better in regard to his plantar foot ulcer. This is not completely healed yet but it is thinking this morning and appears better than last week's evaluation. He has been tolerating the dressing changes without complication. Electronic Signature(s) Signed: 07/06/2018 6:29:07 AM By: Worthy Keeler PA-C Entered By: Worthy Keeler on 07/06/2018 05:41:28 [name] ([id number]) -------------------------------------------------------------------------------- Physical Exam Details Patient Name: AVRAJ, LINDROTH. Date of Service: 07/05/2018 9:00 AM Medical Record Number: [id number] Patient  Account Number: [id number] Date of Birth/Sex: [birth year] (68 y.o. M) Treating RN: Montey Hora Primary Care Provider: Benita Stabile Other Clinician: Referring Provider: TATE, Sharlet Salina Treating Provider/Extender: STONE III, HOYT Weeks in Treatment: 2 Constitutional Well-nourished and well-hydrated in no acute distress. Respiratory normal breathing without difficulty. Psychiatric this patient is able to make decisions and demonstrates good insight into disease process. Alert and Oriented x 3. pleasant and cooperative. Notes Patient's wound bed in question debridement today to clear away some of callous and slough from the surface of the wound. He tolerated this without complication post debridement wound bed appears to be doing much better. Electronic Signature(s) Signed: 07/06/2018 6:29:07 AM By: Worthy Keeler PA-C Entered By: Worthy Keeler on 07/06/2018 05:45:20 Krack, [name] ([id number]) -------------------------------------------------------------------------------- Physician Orders Details Patient Name: DIAZ, CRAGO. Date of Service: 07/05/2018 9:00 AM Medical Record Number: [id number] Patient Account Number: [id number] Date of Birth/Sex: 01/08/1951 (68 y.o. M) Treating RN: Montey Hora Primary Care Provider: Benita Stabile Other Clinician: Referring Provider: Benita Stabile Treating Provider/Extender: Melburn Hake, HOYT Weeks in Treatment: 2 Verbal / Phone Orders: No Diagnosis Coding ICD-10 Coding Code Description E11.621 Type 2 diabetes mellitus with foot ulcer L97.512 Non-pressure chronic ulcer of other part of right foot with fat layer exposed I10 Essential (primary) hypertension I48.20 Chronic atrial fibrillation,  unspecified Wound Cleansing Wound #4 Right,Plantar Foot o Clean wound with Normal Saline. Anesthetic (add to Medication List) Wound #4 Right,Plantar Foot o Topical Lidocaine 4% cream applied to wound bed prior to debridement (In Clinic Only). Primary Wound  Dressing Wound #4 Right,Plantar Foot o Silver Collagen Secondary Dressing Wound #4 Humphrey Dressing Change Frequency Wound #4 Right,Plantar Foot o Change dressing every other day. Follow-up Appointments Wound #4 Right,Plantar Foot o Return Appointment in 1 week. Edema Control Wound #4 Right,Plantar Foot o Elevate legs to the level of the heart and pump ankles as often as possible Off-Loading Wound #4 Right,Plantar Foot o Open toe surgical shoe with peg assist. - right Additional Orders / Instructions LEELYND, MALDONADO. ([id number]) Wound #4 Right,Plantar Foot o Increase protein intake. Electronic Signature(s) Signed: 07/05/2018 4:26:15 PM By: Montey Hora Signed: 07/06/2018 6:29:07 AM By: Worthy Keeler PA-C Entered By: Montey Hora on 07/05/2018 09:50:53 Rosene, [name] ([id number]) -------------------------------------------------------------------------------- Problem List Details Patient Name: LEEVI, CULLARS. Date of Service: 07/05/2018 9:00 AM Medical Record Number: [id number] Patient Account Number: [id number] Date of Birth/Sex: 06-Sep-1950 (68 y.o. M) Treating RN: Montey Hora Primary Care Provider: Benita Stabile Other Clinician: Referring Provider: Benita Stabile Treating Provider/Extender: Melburn Hake, HOYT Weeks in Treatment: 2 Active Problems ICD-10 Evaluated Encounter Code Description Active Date Today Diagnosis E11.621 Type 2 diabetes mellitus with foot ulcer 06/21/2018 No Yes L97.512 Non-pressure chronic ulcer of other part of right foot with fat 06/21/2018 No Yes layer exposed I10 Essential (primary) hypertension 06/21/2018 No Yes I48.20 Chronic atrial fibrillation, unspecified 06/21/2018 No Yes Inactive Problems Resolved Problems Electronic Signature(s) Signed: 07/06/2018 6:29:07 AM By: Worthy Keeler PA-C Entered By: Worthy Keeler on 07/05/2018 09:18:44 Mccullars, [name]  ([id number]) -------------------------------------------------------------------------------- Progress Note Details Patient Name: [name]. Date of Service: 07/05/2018 9:00 AM Medical Record Number: [id number] Patient Account Number: [id number] Date of Birth/Sex: 1950/09/13 (68 y.o. M) Treating RN: Montey Hora Primary Care Provider: Benita Stabile Other Clinician: Referring Provider: Benita Stabile Treating Provider/Extender: Melburn Hake, HOYT Weeks in Treatment: 2 Subjective Chief Complaint Information obtained from Patient Right plantar foot ulcer History of Present Illness (HPI) The following HPI elements were documented for the patient's wound: Associated Signs and Symptoms: Patient has a history of diabetes mellitus type II, hypertension, and atrial fibrillation. He also has a hammertoe deformity of the bilateral feet which seems to be causing pressure in the ball of his foot. 03/09/17 on evaluation today patient presents for his initial evaluation concerning an ulcer on the plantar aspect of his right foot which has been open he tells me for about three weeks. He has been seen at [facility] where they did perform debridement it appears according to a note on 02/22/17 where unfortunately it appears that his callous area began to break down into an ulcer after modifications have been added to his insulin. This had happened previously as well. However I do not have details of the severity of debridement at that point although it does not sound as if you the debridement was too significant based on what the patient is telling me that he still had a lot of callous following debridement. With that being said they were going to look into altering his insoles to try to prevent further breakdown in the future. Nonetheless he has had foul odor discharge coming from the ulcer which is noted all the way back to that visit on 02/[facility]. Patient states that this was  concerning  him more than anything else. He does have diabetes although he described this as "borderline diabetes" he is on metoprolol however along with lisinopril. Patient is not having any issues with pain in regard to his right plantar foot. 03/16/17 on evaluation today patient appears to be doing much better in regard to his plantar foot ulcer. He actually tells me that he loves the peg assist offloading shoe and that he hasn't had any pain in the ulcer area from the callous since I worked on it last week. Overall he is extremely happy with how things have progressed. Likewise the wound bed has no slough noted there is no evidence of infection and it looks excellent. I did receive the results of his hemoglobin A1c which showed a value of 5.9 which was elevated but actually rather well. Subsequently I did also receive the x-ray of his foot which showed diffuse degenerative change but no underlying acute bony abnormality. 03/23/17 on evaluation today patient appears to be doing better in regard to his right plantar foot ulcer. He continues to show signs of improvement there's definitely not as much drainage at this point. He has been tolerating the dressing changes without complication he is not happy with the peg assist offloading shoe but at the same time I do believe that it is making good progress as far as offloading is concerned. I still believe he may need to talk to a surgeon about surgically correcting a hammertoe in order to avoid additional pressure to the site especially since he Artie has diabetic shoes and they do not seem to have prevented callous buildup in ulcer formation. 03/30/17 on evaluation today patient appears to be doing better in regard to his right plantar foot ulcer. Continues to show signs of improvement which is good news. Unfortunately we were unable to get the appointment with the orthopedic, Dr. Doran Durand, whom I recommended for him due to patient having a balance at the practice.  With that being said patient's foot ulcer does seem to be doing much better on evaluation today he still has some depth to the wound but overall we are seeing improvement and epithelialization week by week. Hopefully this is something that will close shortly. 04/05/17 on evaluation today patient unfortunately has what appears to be an area of the plantar surface of his ulcer where he had fluid collection where the callous grew over top of the wound bed. Unfortunately there was some pus like material noted during cleansing that we did sent for culture today. Hopefully this is not truly an infection is or does not appear to be any JAQUARIOUS, GREY. ([id number]) evidence of erythema surrounding and maybe this is just simply a small setback with a fluid collection that has caused this issue. Nonetheless we will see what that shows when we get the culture back. I am also going to have it completely antibiotic which I previously placed him on anyway which will hopefully prevent any true infection from setting in. 04/12/17 on evaluation today patient's ulcer on the plantar aspect of his right foot actually appears to be better then during last weeks evaluation. In general he has been tolerating the dressing changes in utilizing the offloading shoe which does seem to be beneficial for him. With that being said he still seems to get some pressure to the area just not nearly as much as he previously had noted. Again fortunately there is no significant pain. 04/19/17 on evaluation today patient appears to be doing excellent  in regard to his right foot ulcer. He has been tolerating the dressing changes without complication. There really has not been any drainage over the past few days he tells me. With that being said he seems to be doing excellent in my opinion at this point he does have some callous buildup. 04/26/17 On evaluation today patient's wound appears to be completely healed which is great news. He has been  tolerating the dressing changes without complication and I do think he has done very well in regard to the healing process and offloading he has listed everything that that we instructed him to do. Obviously this appears to have paid off and he has progressed very nicely. Readmission: 07/05/17 on evaluation today patient is seen for fault evaluation and our clinic concerning the same issue that I have previously treated him for ending back in April 2019. This is a callous region with subsequently an ulcer underneath this on the plantar aspect of his right second metatarsal region. Fortunately he's not having any significant discomfort although his girlfriend told him that she noted an area of bruising at the site underneath the callous and wanted him to come get this checked out. He was very please with our care previous and therefore was more than happy to come let us check this for him. Upon inspection initially he did have significant callous overlying the area in question. It was not easily identifiable as far as any ulcer was concerned. With that being said he with this significant callous did require some sharp debridement and following debridement there did appear to be a small ulcer underlying. Fortunately patient in general shows no signs of infection at this point. He again has neuropathy and therefore does not have any ongoing discomfort or pain at this point. 07/12/17 on evaluation today patient actually appears to be doing very well in regard to his plantar foot ulcer. In fact this appears to be completely healed and there's no residual opening at this time. Overall I'm very pleased in this regard. Patient also is extremely pleased. Readmission: 02/08/18 patient presents today for follow-up evaluation he has previously been seen here in our office for the same issue. Fortunately there is not been any evidence of infection since I last saw him he does note that he is been going to see his  local podiatrist for callous pairing. During that time they actually noted that he had an open wound and subsequently referred him back ties for evaluation of this wound. He has done very well in the past with good wound care often healing in just a few weeks. No fevers, chills, nausea, or vomiting noted at this time. 02/15/18 on evaluation today patient actually appears to be doing very well in regard to his plantar foot ulcer. He is been tolerating the dressing changes without complication. There does appear to be some eschar of the line the wound surface. Fortunately I am happy with how things are progressing from the standpoint of the wound progress from last week to this week. 02/22/18 on evaluation today patient appears to be doing very well in regard to his plantar foot ulcer. He did have callous buildup around the area that required some sharp debridement today. He tolerated the debridement without complication. Post debridement the wound bed appears to be doing much better there's just a very tiny opening still remaining. 03/01/18 on evaluation today patient appears to be doing excellent in regard to his plantar foot ulcer. In fact after I debrided away  what appeared to be a dried blood clot on the surface and actually appear there was just a very small opening remaining in the central portion of the wound. This is literally a pinpoint region. Overall he has done well and I'm very pleased in this regard. I think that likely one more week will see this area completely healed. 03/08/18 on evaluation today patient appears to actually be doing very well in regard to his plantar foot ulcer. He has been tolerating the dressing changes without complication. Fortunately this appears to be completely healed at this point which is excellent news. Overall I'm extremely happy with how things have progressed. DAI, MCADAMS ([id number]) 06/21/18 on evaluation today patient presents for evaluation in our  clinic concerning issues that he's having with you seeing the area of his right foot that is previously experienced issues with. Fortunately there does not appear to be any evidence of infection at this time which is good news. With that being said he tells me that he has noted the callous buildup of the past several months but it was only in the past roughly week that he has noted drainage from the callous. He's not having any significant discomfort other than the fact that he states walking on the area with the extensive callous seems to be an issue. 06/28/18 on evaluation today patient actually appears to be doing very well in regard to his plantar foot ulcer. He's been tolerating the dressing changes without complication. There is some callous buildup though not nearly as much as noted last week. Subsequently there is some need for sharp debridement today. 07/05/18 on evaluation today patient appears to be doing much better in regard to his plantar foot ulcer. This is not completely healed yet but it is thinking this morning and appears better than last week's evaluation. He has been tolerating the dressing changes without complication. Patient History Information obtained from Patient. Family History Cancer - Siblings, Diabetes - Father, Heart Disease - Mother, Hypertension - Mother, No family history of Hereditary Spherocytosis, Kidney Disease, Lung Disease, Seizures, Stroke, Thyroid Problems, Tuberculosis. Social History Former smoker, Marital Status - Single, Alcohol Use - Never, Drug Use - No History, Caffeine Use - Moderate. Medical History Eyes Denies history of Cataracts, Glaucoma, Optic Neuritis Ear/Nose/Mouth/Throat Denies history of Chronic sinus problems/congestion, Middle ear problems Hematologic/Lymphatic Denies history of Anemia, Hemophilia, Human Immunodeficiency Virus, Lymphedema, Sickle Cell Disease Respiratory Denies history of Aspiration, Asthma, Chronic Obstructive  Pulmonary Disease (COPD), Pneumothorax, Sleep Apnea, Tuberculosis Cardiovascular Patient has history of Arrhythmia - a fib, Hypertension Denies history of Angina, Congestive Heart Failure, Coronary Artery Disease, Deep Vein Thrombosis, Hypotension, Myocardial Infarction, Peripheral Arterial Disease, Peripheral Venous Disease, Phlebitis, Vasculitis Gastrointestinal Denies history of Cirrhosis , Colitis, Crohn s, Hepatitis A, Hepatitis B, Hepatitis C Endocrine Patient has history of Type II Diabetes Denies history of Type I Diabetes Genitourinary Denies history of End Stage Renal Disease Immunological Denies history of Lupus Erythematosus, Raynaud s, Scleroderma Integumentary (Skin) Denies history of History of Burn, History of pressure wounds Musculoskeletal Patient has history of Osteoarthritis Denies history of Gout, Rheumatoid Arthritis, Osteomyelitis Neurologic Denies history of Dementia, Neuropathy, Quadriplegia, Paraplegia, Seizure Disorder Oncologic Denies history of Received Chemotherapy, Received Radiation Medical And Surgical History Notes CONLIN, BRAHM ([id number]) Cardiovascular High Cholesterol Review of Systems (ROS) Constitutional Symptoms (General Health) Denies complaints or symptoms of Fatigue, Fever, Chills, Marked Weight Change. Respiratory Denies complaints or symptoms of Chronic or frequent coughs, Shortness of Breath. Cardiovascular Denies complaints or  symptoms of Chest pain, LE edema. Psychiatric Denies complaints or symptoms of Anxiety, Claustrophobia. Objective Constitutional Well-nourished and well-hydrated in no acute distress. Vitals Time Taken: 9:02 AM, Height: 74 in, Weight: 233 lbs, BMI: 29.9, Temperature: 98.2 F, Pulse: 128 bpm, Respiratory Rate: 16 breaths/min, Blood Pressure: 118/82 mmHg. Respiratory normal breathing without difficulty. Psychiatric this patient is able to make decisions and demonstrates good insight into disease  process. Alert and Oriented x 3. pleasant and cooperative. General Notes: Patient's wound bed in question debridement today to clear away some of callous and slough from the surface of the wound. He tolerated this without complication post debridement wound bed appears to be doing much better. Integumentary (Hair, Skin) Wound #4 status is Open. Original cause of wound was Pressure Injury. The wound is located on the [place]. The wound measures 0.3cm length x 0.3cm width x 0.3cm depth; 0.071cm^2 area and 0.021cm^3 volume. There is Fat Layer (Subcutaneous Tissue) Exposed exposed. There is no tunneling or undermining noted. There is a medium amount of sanguinous drainage noted. The wound margin is indistinct and nonvisible. There is small (1-33%) red granulation within the wound bed. There is a medium (34-66%) amount of necrotic tissue within the wound bed including Eschar. Assessment Active Problems ICD-10 Type 2 diabetes mellitus with foot ulcer Non-pressure chronic ulcer of other part of right foot with fat layer exposed Popper, [place]. ([id number]) Essential (primary) hypertension Chronic atrial fibrillation, unspecified Procedures Wound #4 Pre-procedure diagnosis of Wound #4 is a Diabetic Wound/Ulcer of the Lower Extremity located on the [place] .Severity of Tissue Pre Debridement is: Fat layer exposed. There was a Excisional Skin/Subcutaneous Tissue Debridement with a total area of 0.09 sq cm performed by STONE III, HOYT E., PA-C. With the following instrument(s): Curette to remove Viable and Non-Viable tissue/material. Material removed includes Callus, Subcutaneous Tissue, and Slough after achieving pain control using Lidocaine 4% Topical Solution. No specimens were taken. A time out was conducted at 09:42, prior to the start of the procedure. A Minimum amount of bleeding was controlled with Pressure. The procedure was tolerated well with a pain level of 0  throughout and a pain level of 0 following the procedure. Post Debridement Measurements: 0.4cm length x 0.4cm width x 0.2cm depth; 0.025cm^3 volume. Character of Wound/Ulcer Post Debridement is improved. Severity of Tissue Post Debridement is: Fat layer exposed. Post procedure Diagnosis Wound #4: Same as Pre-Procedure Plan Wound Cleansing: Wound #4 Right,Plantar Foot: Clean wound with Normal Saline. Anesthetic (add to Medication List): Wound #4 Right,Plantar Foot: Topical Lidocaine 4% cream applied to wound bed prior to debridement (In Clinic Only). Primary Wound Dressing: Wound #4 Right,Plantar Foot: Silver Collagen Secondary Dressing: Wound #4 Right,Plantar Foot: [place] Dressing Change Frequency: Wound #4 Right,Plantar Foot: Change dressing every other day. Follow-up Appointments: Wound #4 Right,Plantar Foot: Return Appointment in 1 week. Edema Control: Wound #4 Right,Plantar Foot: Elevate legs to the level of the heart and pump ankles as often as possible Off-Loading: Wound #4 Right,Plantar Foot: Open toe surgical shoe with peg assist. - right Additional Orders / Instructions: Wound #4 Right,Plantar Foot: Increase protein intake. KIRAN, CARLINE ([id number]) My suggestion currently is gonna be that we continue with the above wound to measures for the next week and the patient is in agreement with plan. We will subsequently see the extent follow-up. If anything changes or worsens meantime he will contact the office let me know. Please see above for specific wound care orders. We will see patient for re-evaluation  in 1 week(s) here in the clinic. If anything worsens or changes patient will contact our office for additional recommendations. Electronic Signature(s) Signed: 07/06/2018 6:29:07 AM By: Worthy Keeler PA-C Entered By: Worthy Keeler on 07/06/2018 05:45:33 Willard, [name]  ([id number]) -------------------------------------------------------------------------------- ROS/PFSH Details Patient Name: TALHA, ISER Date of Service: 07/05/2018 9:00 AM Medical Record Number: [id number] Patient Account Number: [id number] Date of Birth/Sex: 27-Jul-1950 (68 y.o. M) Treating RN: Montey Hora Primary Care Provider: Benita Stabile Other Clinician: Referring Provider: Benita Stabile Treating Provider/Extender: STONE III, HOYT Weeks in Treatment: 2 Information Obtained From Patient Constitutional Symptoms (General Health) Complaints and Symptoms: Negative for: Fatigue; Fever; Chills; Marked Weight Change Respiratory Complaints and Symptoms: Negative for: Chronic or frequent coughs; Shortness of Breath Medical History: Negative for: Aspiration; Asthma; Chronic Obstructive Pulmonary Disease (COPD); Pneumothorax; Sleep Apnea; Tuberculosis Cardiovascular Complaints and Symptoms: Negative for: Chest pain; LE edema Medical History: Positive for: Arrhythmia - a fib; Hypertension Negative for: Angina; Congestive Heart Failure; Coronary Artery Disease; Deep Vein Thrombosis; Hypotension; Myocardial Infarction; Peripheral Arterial Disease; Peripheral Venous Disease; Phlebitis; Vasculitis Past Medical History Notes: High Cholesterol Psychiatric Complaints and Symptoms: Negative for: Anxiety; Claustrophobia Eyes Medical History: Negative for: Cataracts; Glaucoma; Optic Neuritis Ear/Nose/Mouth/Throat Medical History: Negative for: Chronic sinus problems/congestion; Middle ear problems Hematologic/Lymphatic Medical History: Negative for: Anemia; Hemophilia; Human Immunodeficiency Virus; Lymphedema; Sickle Cell Disease Gastrointestinal MASYN, ROSTRO. ([id number]) Medical History: Negative for: Cirrhosis ; Colitis; Crohnos; Hepatitis A; Hepatitis B; Hepatitis C Endocrine Medical History: Positive for: Type II Diabetes Negative for: Type I Diabetes Time with diabetes: 3  years Treated with: Oral agents Blood sugar tested every day: No Genitourinary Medical History: Negative for: End Stage Renal Disease Immunological Medical History: Negative for: Lupus Erythematosus; Raynaudos; Scleroderma Integumentary (Skin) Medical History: Negative for: History of Burn; History of pressure wounds Musculoskeletal Medical History: Positive for: Osteoarthritis Negative for: Gout; Rheumatoid Arthritis; Osteomyelitis Neurologic Medical History: Negative for: Dementia; Neuropathy; Quadriplegia; Paraplegia; Seizure Disorder Oncologic Medical History: Negative for: Received Chemotherapy; Received Radiation Immunizations Pneumococcal Vaccine: Received Pneumococcal Vaccination: Yes Immunization Notes: up to date Implantable Devices No devices added Family and Social History Cancer: Yes - Siblings; Diabetes: Yes - Father; Heart Disease: Yes - Mother; Hereditary Spherocytosis: No; Hypertension: Yes - Mother; Kidney Disease: No; Lung Disease: No; Seizures: No; Stroke: No; Thyroid Problems: No; Tuberculosis: No; Former smoker; Marital Status - Single; Alcohol Use: Never; Drug Use: No History; Caffeine Use: Moderate; Financial Concerns: No; Food, Clothing or Shelter Needs: No; Support System Lacking: No; Transportation Concerns: No SEBASTHIAN, STAILEY ([id number]) Physician Affirmation I have reviewed and agree with the above information. Electronic Signature(s) Signed: 07/06/2018 6:29:07 AM By: Worthy Keeler PA-C Signed: 07/06/2018 4:48:03 PM By: Montey Hora Entered By: Worthy Keeler on 07/06/2018 05:45:02 Dubuc, [name] ([id number]) -------------------------------------------------------------------------------- SuperBill Details Patient Name: KALEN, RATAJCZAK. Date of Service: 07/05/2018 Medical Record Number: [id number] Patient Account Number: [id number] Date of Birth/Sex: 1950-01-26 (68 y.o. M) Treating RN: Montey Hora Primary Care Provider: Benita Stabile  Other Clinician: Referring Provider: Benita Stabile Treating Provider/Extender: Melburn Hake, HOYT Weeks in Treatment: 2 Diagnosis Coding ICD-10 Codes Code Description E11.621 Type 2 diabetes mellitus with foot ulcer L97.512 Non-pressure chronic ulcer of other part of right foot with fat layer exposed I10 Essential (primary) hypertension I48.20 Chronic atrial fibrillation, unspecified Facility Procedures CPT4 Code: [id number] Description: 99833 - DEB SUBQ TISSUE 20 SQ CM/< ICD-10 Diagnosis Description L97.512 Non-pressure chronic ulcer of other part of right foot with fa Modifier: t layer exposed  Quantity: 1 Physician Procedures CPT4 Code: 8867737 Description: 36681 - WC PHYS SUBQ TISS 20 SQ CM ICD-10 Diagnosis Description P94.707 Non-pressure chronic ulcer of other part of right foot with fa Modifier: t layer exposed Quantity: 1 Electronic Signature(s) Signed: 07/06/2018 6:29:07 AM By: Worthy Keeler PA-C Entered By: Worthy Keeler on 07/06/2018 05:45:48

## 2018-07-12 ENCOUNTER — Encounter: Payer: PPO | Admitting: Physician Assistant

## 2018-07-12 ENCOUNTER — Other Ambulatory Visit: Payer: Self-pay

## 2018-07-12 DIAGNOSIS — E11621 Type 2 diabetes mellitus with foot ulcer: Secondary | ICD-10-CM | POA: Diagnosis not present

## 2018-07-12 DIAGNOSIS — L97512 Non-pressure chronic ulcer of other part of right foot with fat layer exposed: Secondary | ICD-10-CM | POA: Diagnosis not present

## 2018-07-15 NOTE — Progress Notes (Signed)
TATEN, MERROW ([id number]) Visit Report for 07/12/2018 Arrival Information Details Patient Name: [name], [name]. Date of Service: 07/12/2018 8:15 AM Medical Record Number: [id number] Patient Account Number: [id number] Date of Birth/Sex: 1950-07-12 (68 y.o. M) Treating RN: Montey Hora Primary Care Nezzie Manera: Benita Stabile Other Clinician: Referring Uthman Mroczkowski: Benita Stabile Treating Bowie Doiron/Extender: Melburn Hake, HOYT Weeks in Treatment: 3 Visit Information History Since Last Visit Added or deleted any medications: No Patient Arrived: Ambulatory Any new allergies or adverse reactions: No Arrival Time: 08:15 Had a fall or experienced change in No Accompanied By: self activities of daily living that may affect Transfer Assistance: None risk of falls: Patient Identification Verified: Yes Signs or symptoms of abuse/neglect since last visito No Secondary Verification Process Completed: Yes Hospitalized since last visit: No Implantable device outside of the clinic excluding No cellular tissue based products placed in the center since last visit: Has Dressing in Place as Prescribed: Yes Pain Present Now: No Electronic Signature(s) Signed: 07/12/2018 4:25:12 PM By: Montey Hora Entered By: Montey Hora on 07/12/2018 08:15:33 [name], [name] ([id number]) -------------------------------------------------------------------------------- Encounter Discharge Information Details Patient Name: [name]. Date of Service: 07/12/2018 8:15 AM Medical Record Number: [id number] Patient Account Number: [id number] Date of Birth/Sex: 1950-04-16 (68 y.o. M) Treating RN: Montey Hora Primary Care Tavien Chestnut: Benita Stabile Other Clinician: Referring Lumina Gitto: TATE, Sharlet Salina Treating Aydenn Gervin/Extender: Melburn Hake, HOYT Weeks in Treatment: 3 Encounter Discharge Information Items Post Procedure Vitals Discharge Condition: Stable Temperature (F): 98.2 Ambulatory Status: Ambulatory Pulse (bpm):  90 Discharge Destination: Home Respiratory Rate (breaths/min): 16 Transportation: Private Auto Blood Pressure (mmHg): 149/92 Accompanied By: self Schedule Follow-up Appointment: Yes Clinical Summary of Care: Electronic Signature(s) Signed: 07/12/2018 4:25:12 PM By: Montey Hora Entered By: Montey Hora on 07/12/2018 08:59:58 [name], [name] ([id number]) -------------------------------------------------------------------------------- Lower Extremity Assessment Details Patient Name: [name]. Date of Service: 07/12/2018 8:15 AM Medical Record Number: [id number] Patient Account Number: [id number] Date of Birth/Sex: 01-19-1951 (68 y.o. M) Treating RN: Montey Hora Primary Care Calandria Mullings: Benita Stabile Other Clinician: Referring Imanie Darrow: Benita Stabile Treating Amiere Cawley/Extender: STONE III, HOYT Weeks in Treatment: 3 Edema Assessment Assessed: [Left: No] [Right: No] Edema: [Left: N] [Right: o] Vascular Assessment Pulses: Dorsalis Pedis Palpable: [Right:Yes] Electronic Signature(s) Signed: 07/12/2018 4:25:12 PM By: Montey Hora Entered By: Montey Hora on 07/12/2018 08:24:16 [name], [name] ([id number]) -------------------------------------------------------------------------------- Multi Wound Chart Details Patient Name: [name]. Date of Service: 07/12/2018 8:15 AM Medical Record Number: [id number] Patient Account Number: [id number] Date of Birth/Sex: 1950-11-08 (68 y.o. 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] [N/A:N/A] Secondary Etiology: [4:Pressure Ulcer] [N/A:N/A] Comorbid History: [4:Arrhythmia, Hypertension, Type II Diabetes, Osteoarthritis] [N/A:N/A] Date Acquired: [4:06/13/2018] [N/A:N/A] Weeks of Treatment: [4:3] [N/A:N/A] Wound Status: [4:Open] [N/A:N/A] Measurements L x W x D [4:0.2x0.3x0.3] [N/A:N/A] (cm) Area (cm) : [4:0.047] [N/A:N/A] Volume (cm) : [4:0.014] [N/A:N/A] % Reduction in Area: [4:99.00%] [N/A:N/A] % Reduction in Volume: [4:99.80%] [N/A:N/A] Starting Position 1 [4:10] (o'clock): Ending Position 1 [4:2] (o'clock): Maximum Distance 1 (cm): [4:0.4] Undermining: [4:Yes] [N/A:N/A] Classification: [4:Grade 2] [N/A:N/A] Exudate Amount: [4:Medium] [N/A:N/A] Exudate Type: [4:Sanguinous] [N/A:N/A] Exudate Color: [4:red] [N/A:N/A] Wound Margin: [4:Indistinct, nonvisible] [N/A:N/A] Granulation Amount: [4:Large (67-100%)] [  N/A:N/A] Granulation Quality: [4:Red] [N/A:N/A] Necrotic Amount: [4:Small (1-33%)] [N/A:N/A] Exposed Structures: [4:Fat Layer (Subcutaneous Tissue) Exposed: Yes Fascia: No Tendon: No Muscle: No] [N/A:N/A] Joint: No Bone: No Epithelialization: Small (1-33%) N/A N/A Treatment Notes Electronic Signature(s) Signed: 07/12/2018 4:25:12 PM By: Montey Hora Entered By: Montey Hora on 07/12/2018 08:26:11 [name], [name] ([id number]) -------------------------------------------------------------------------------- [place] Details Patient Name: [name]. Date of Service: 07/12/2018 8:15 AM Medical Record Number: [id number] Patient Account Number: [id number] Date of Birth/Sex: 1950/07/31 (68 y.o. M) Treating RN: Montey Hora Primary Care Ambera Fedele: Benita Stabile Other Clinician: Referring Tashona Calk: Benita Stabile Treating Elzy Tomasello/Extender: Melburn Hake, HOYT Weeks in Treatment: 3 Active Inactive Necrotic Tissue Nursing Diagnoses: Impaired tissue integrity related to  necrotic/devitalized tissue Goals: Necrotic/devitalized tissue will be minimized in the wound bed Date Initiated: 06/21/2018 Target Resolution Date: 06/21/2018 Goal Status: Active Patient/caregiver will verbalize understanding of reason and process for debridement of necrotic tissue Date Initiated: 06/21/2018 Target Resolution Date: 06/21/2018 Goal Status: Active Interventions: Assess patient pain level pre-, during and post procedure and prior to discharge Treatment Activities: Apply topical anesthetic as ordered : 06/21/2018 Notes: Orientation to the Wound Care Program Nursing Diagnoses: Knowledge deficit related to the wound healing center program Goals: Patient/caregiver will verbalize understanding of the [facility] Date Initiated: 06/21/2018 Target Resolution Date: 06/21/2018 Goal Status: Active Interventions: Provide education on orientation to the wound center Notes: Pressure Nursing Diagnoses: Knowledge deficit related to management of pressures ulcers Goals: [name], [name] ([id number]) Patient will remain free from development of additional pressure ulcers Date Initiated: 06/21/2018 Target Resolution Date: 06/29/2018 Goal Status: Active Interventions: Assess: immobility, friction, shearing, incontinence upon admission and as needed Provide education on pressure ulcers Notes: Wound/Skin Impairment Nursing Diagnoses: Impaired tissue integrity Goals: Ulcer/skin breakdown will have a volume reduction of 30% by week 4 Date Initiated: 06/21/2018 Target Resolution Date: 07/21/2018 Goal Status: Active Interventions: Assess patient/caregiver ability to perform ulcer/skin care regimen upon admission and as needed Assess ulceration(s) every visit Treatment Activities: Referred to DME Keyatta Tolles for dressing supplies : 06/21/2018 Skin care regimen initiated : 06/21/2018 Topical wound management initiated : 06/21/2018 Notes: Electronic Signature(s) Signed: 07/12/2018 4:25:12  PM By: Montey Hora Entered By: Montey Hora on 07/12/2018 08:26:02 [name], [name] ([id number]) -------------------------------------------------------------------------------- Pain Assessment Details Patient Name: [name]. Date of Service: 07/12/2018 8:15 AM Medical Record Number: [id number] Patient Account Number: [id number] Date of Birth/Sex: 1950/09/25 (68 y.o. M) Treating RN: Montey Hora Primary Care Tylor Gambrill: Benita Stabile Other Clinician: Referring Fradel Baldonado: Benita Stabile Treating Fadi Menter/Extender: Melburn Hake, HOYT Weeks in Treatment: 3 Active Problems Location of Pain Severity and Description of Pain Patient Has Paino No Site Locations Pain Management and Medication Current Pain Management: Electronic Signature(s) Signed: 07/12/2018 4:25:12 PM By: Montey Hora Entered By: Montey Hora on 07/12/2018 08:15:40 [name], [name] ([id number]) -------------------------------------------------------------------------------- Patient/Caregiver Education Details Patient Name: [name] Date of Service: 07/12/2018 8:15 AM Medical Record Number: [id number] Patient Account Number: [id number] Date of Birth/Gender: [birth year] (68 y.o. M) Treating RN: Montey Hora Primary Care Physician: Benita Stabile Other Clinician: Referring Physician: Benita Stabile Treating Physician/Extender: Sharalyn Ink in Treatment: 3 Education Assessment Education Provided To: Patient Education Topics Provided Wound/Skin Impairment: Handouts: Other: wound care as ordered Methods: Demonstration, Explain/Verbal Responses: State content correctly Electronic Signature(s) Signed: 07/12/2018 4:25:12 PM By: Montey Hora Entered By: Montey Hora on 07/12/2018 08:59:02 [name], [name] ([id number]) -------------------------------------------------------------------------------- Wound Assessment Details Patient Name: [name]. Date of Service: 07/12/2018 8:15 AM Medical Record  Number:  [id number] Patient Account Number: [id number] Date of Birth/Sex: 1950/02/05 (68 y.o. M) Treating RN: Montey Hora Primary Care Taje Littler: Benita Stabile Other Clinician: Referring Yaslin Kirtley: Benita Stabile Treating Jozalyn Baglio/Extender: STONE III, HOYT Weeks in Treatment: 3 Wound Status Wound Number: 4 Primary Etiology: Diabetic Wound/Ulcer of the Lower Extremity Wound Location: Right Foot - Plantar Secondary Pressure Ulcer Wounding Event: Pressure Injury Etiology: Date Acquired: 06/13/2018 Wound Status: Open Weeks Of Treatment: 3 Comorbid Arrhythmia, Hypertension, Type II Diabetes, Clustered Wound: No History: Osteoarthritis Photos Photo Uploaded By: Montey Hora on 07/12/2018 10:34:20 Wound Measurements Length: (cm) 0.2 Width: (cm) 0.3 Depth: (cm) 0.3 Area: (cm) 0.047 Volume: (cm) 0.014 % Reduction in Area: 99% % Reduction in Volume: 99.8% Epithelialization: Small (1-33%) Tunneling: No Undermining: Yes Starting Position (o'clock): 10 Ending Position (o'clock): 2 Maximum Distance: (cm) 0.4 Wound Description Classification: Grade 2 Wound Margin: Indistinct, nonvisible Exudate Amount: Medium Exudate Type: Sanguinous Exudate Color: red Foul Odor After Cleansing: No Slough/Fibrino No Wound Bed Granulation Amount: Large (67-100%) Exposed Structure Granulation Quality: Red Fascia Exposed: No Necrotic Amount: Small (1-33%) Fat Layer (Subcutaneous Tissue) Exposed: Yes Necrotic Quality: Adherent Slough Tendon Exposed: No Muscle Exposed: No [name], [name] 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 Date of Service: 07/12/2018 8:15 AM Medical Record Number: [id number] Patient Account Number: [id number] Date of Birth/Sex: 08-Jul-1950 (68 y.o. M) Treating RN: Montey Hora Primary Care Graylen Noboa: Benita Stabile Other Clinician: Referring Jeannia Tatro: Benita Stabile Treating Rubye Strohmeyer/Extender: STONE III, HOYT Weeks in Treatment: 3 Vital Signs Time Taken: 08:15 Temperature (F): 98.2 Height (in): 74 Pulse (bpm): 92 Weight (lbs): 233 Respiratory Rate (breaths/min): 18 Body Mass Index (BMI): 29.9 Blood Pressure (mmHg): 149/92 Reference Range: 80 - 120 mg / dl Electronic Signature(s) Signed: 07/12/2018 4:25:12 PM By: Montey Hora Entered By: Montey Hora on 07/12/2018 34:28:76

## 2018-07-15 NOTE — Progress Notes (Signed)
[name], [name] ([id number]) Visit Report for 07/12/2018 Chief Complaint Document Details Patient Name: [name], [name]. Date of Service: 07/12/2018 8:15 AM Medical Record Number: [id number] Patient Account Number: [id number] Date of Birth/Sex: Jun 13, 1950 (68 y.o. M) Treating RN: Montey Hora Primary Care Provider: Benita Stabile Other Clinician: Referring Provider: Benita Stabile Treating Provider/Extender: Melburn Hake, HOYT Weeks in Treatment: 3 Information Obtained from: Patient Chief Complaint Right plantar foot ulcer Electronic Signature(s) Signed: 07/13/2018 10:45:49 PM By: Worthy Keeler PA-C Entered By: Worthy Keeler on 07/12/2018 08:16:01 Sabado, [name] ([id number]) -------------------------------------------------------------------------------- Debridement Details Patient Name: [name]. Date of Service: 07/12/2018 8:15 AM Medical Record Number: [id number] Patient Account Number: [id number] Date of Birth/Sex: Dec 23, 1950 (68 y.o. M) Treating RN: Montey Hora Primary Care Provider: Benita Stabile Other Clinician: Referring Provider: Benita Stabile Treating Provider/Extender: STONE III, HOYT Weeks in Treatment: 3 Debridement Performed for Wound #4 Right,Plantar Foot Assessment: Performed By: Physician STONE III, HOYT E., PA-C Debridement Type: Debridement Severity of Tissue Pre Fat layer exposed Debridement: Level of Consciousness (Pre- Awake and Alert procedure): Pre-procedure Verification/Time Yes - 08:52 Out Taken: Start Time: 08:52 Pain Control: Lidocaine 4% Topical Solution Total Area Debrided (L x W): 0.2 (cm) x 0.3 (cm) = 0.06 (cm) Tissue and other material Viable, Non-Viable, Callus, Slough, Subcutaneous, Slough debrided: Level: Skin/Subcutaneous Tissue Debridement Description: Excisional Instrument: Curette Bleeding: Minimum Hemostasis Achieved: Pressure End Time: 08:57 Procedural Pain: 0 Post Procedural Pain: 0 Response to Treatment: Procedure was  tolerated well Level of Consciousness Awake and Alert (Post-procedure): Post Debridement Measurements of Total Wound Length: (cm) 0.4 Width: (cm) 0.4 Depth: (cm) 0.5 Volume: (cm) 0.063 Character of Wound/Ulcer Post Debridement: Improved Severity of Tissue Post Debridement: Fat layer exposed Post Procedure Diagnosis Same as Pre-procedure Electronic Signature(s) Signed: 07/12/2018 4:25:12 PM By: Montey Hora Signed: 07/13/2018 10:45:49 PM By: Worthy Keeler PA-C Entered By: Montey Hora on 07/12/2018 08:57:40 [name], [name] ([id number]) -------------------------------------------------------------------------------- HPI Details Patient Name: [name]. Date of Service: 07/12/2018 8:15 AM Medical Record Number: [id number] Patient Account Number: [id number] Date of Birth/Sex: 28-May-1950 (68 y.o. M) Treating RN: Montey Hora Primary Care Provider: Benita Stabile Other Clinician: Referring Provider: Benita Stabile Treating Provider/Extender: Melburn Hake, HOYT Weeks in Treatment: 3 History of Present Illness Associated Signs and Symptoms: Patient has a history of diabetes mellitus type II, hypertension, and atrial fibrillation. He also has a hammertoe deformity of the bilateral feet which seems to be causing pressure in the ball of his foot. HPI Description: 03/09/17 on evaluation today patient presents for his initial evaluation concerning an ulcer on the plantar aspect of his right foot which has been open he tells me for about three weeks. He has been seen at [facility] where they did perform debridement it appears according to a note on 02/22/17 where unfortunately it appears that his callous area began to break down into an ulcer after modifications have been added to his insulin. This had happened previously as well. However I do not have details of the severity of debridement at that point although it does not sound as if you the debridement was too significant based on  what the patient is telling me that he still had a lot of callous following debridement. With that being said they were going to look into altering his insoles to try to prevent further breakdown in the future. Nonetheless he has had foul odor discharge coming from the ulcer which is noted all the way back to that visit on 02/22/17  [facility]. Patient states that this was concerning him more than anything else. He does have diabetes although he described this as "borderline diabetes" he is on metoprolol however along with lisinopril. Patient is not having any issues with pain in regard to his right plantar foot. 03/16/17 on evaluation today patient appears to be doing much better in regard to his plantar foot ulcer. He actually tells me that he loves the peg assist offloading shoe and that he hasn't had any pain in the ulcer area from the callous since I worked on it last week. Overall he is extremely happy with how things have progressed. Likewise the wound bed has no slough noted there is no evidence of infection and it looks excellent. I did receive the results of his hemoglobin A1c which showed a value of 5.9 which was elevated but actually rather well. Subsequently I did also receive the x-ray of his foot which showed diffuse degenerative change but no underlying acute bony abnormality. 03/23/17 on evaluation today patient appears to be doing better in regard to his right plantar foot ulcer. He continues to show signs of improvement there's definitely not as much drainage at this point. He has been tolerating the dressing changes without complication he is not happy with the peg assist offloading shoe but at the same time I do believe that it is making good progress as far as offloading is concerned. I still believe he may need to talk to a surgeon about surgically correcting a hammertoe in order to avoid additional pressure to the site especially since he Artie has diabetic shoes and they  do not seem to have prevented callous buildup in ulcer formation. 03/30/17 on evaluation today patient appears to be doing better in regard to his right plantar foot ulcer. Continues to show signs of improvement which is good news. Unfortunately we were unable to get the appointment with the orthopedic, Dr. Doran Durand, whom I recommended for him due to patient having a balance at the practice. With that being said patient's foot ulcer does seem to be doing much better on evaluation today he still has some depth to the wound but overall we are seeing improvement and epithelialization week by week. Hopefully this is something that will close shortly. 04/05/17 on evaluation today patient unfortunately has what appears to be an area of the plantar surface of his ulcer where he had fluid collection where the callous grew over top of the wound bed. Unfortunately there was some pus like material noted during cleansing that we did sent for culture today. Hopefully this is not truly an infection is or does not appear to be any evidence of erythema surrounding and maybe this is just simply a small setback with a fluid collection that has caused this issue. Nonetheless we will see what that shows when we get the culture back. I am also going to have it completely antibiotic which I previously placed him on anyway which will hopefully prevent any true infection from setting in. 04/12/17 on evaluation today patient's ulcer on the plantar aspect of his right foot actually appears to be better then during last weeks evaluation. In general he has been tolerating the dressing changes in utilizing the offloading shoe which does seem to be beneficial for him. With that being said he still seems to get some pressure to the area just not nearly as much as he previously had noted. Again fortunately there is no significant pain. [name], [name] ([id number]) 04/19/17 on  evaluation today patient appears to be doing excellent in  regard to his right foot ulcer. He has been tolerating the dressing changes without complication. There really has not been any drainage over the past few days he tells me. With that being said he seems to be doing excellent in my opinion at this point he does have some callous buildup. 04/26/17 On evaluation today patient's wound appears to be completely healed which is great news. He has been tolerating the dressing changes without complication and I do think he has done very well in regard to the healing process and offloading he has listed everything that that we instructed him to do. Obviously this appears to have paid off and he has progressed very nicely. Readmission: 07/05/17 on evaluation today patient is seen for fault evaluation and our clinic concerning the same issue that I have previously treated him for ending back in April 2019. This is a callous region with subsequently an ulcer underneath this on the plantar aspect of his right second metatarsal region. Fortunately he's not having any significant discomfort although his girlfriend told him that she noted an area of bruising at the site underneath the callous and wanted him to come get this checked out. He was very please with our care previous and therefore was more than happy to come let us check this for him. Upon inspection initially he did have significant callous overlying the area in question. It was not easily identifiable as far as any ulcer was concerned. With that being said he with this significant callous did require some sharp debridement and following debridement there did appear to be a small ulcer underlying. Fortunately patient in general shows no signs of infection at this point. He again has neuropathy and therefore does not have any ongoing discomfort or pain at this point. 07/12/17 on evaluation today patient actually appears to be doing very well in regard to his plantar foot ulcer. In fact this appears to be  completely healed and there's no residual opening at this time. Overall I'm very pleased in this regard. Patient also is extremely pleased. Readmission: 02/08/18 patient presents today for follow-up evaluation he has previously been seen here in our office for the same issue. Fortunately there is not been any evidence of infection since I last saw him he does note that he is been going to see his local podiatrist for callous pairing. During that time they actually noted that he had an open wound and subsequently referred him back ties for evaluation of this wound. He has done very well in the past with good wound care often healing in just a few weeks. No fevers, chills, nausea, or vomiting noted at this time. 02/15/18 on evaluation today patient actually appears to be doing very well in regard to his plantar foot ulcer. He is been tolerating the dressing changes without complication. There does appear to be some eschar of the line the wound surface. Fortunately I am happy with how things are progressing from the standpoint of the wound progress from last week to this week. 02/22/18 on evaluation today patient appears to be doing very well in regard to his plantar foot ulcer. He did have callous buildup around the area that required some sharp debridement today. He tolerated the debridement without complication. Post debridement the wound bed appears to be doing much better there's just a very tiny opening still remaining. 03/01/18 on evaluation today patient appears to be doing excellent in regard to his plantar  foot ulcer. In fact after I debrided away what appeared to be a dried blood clot on the surface and actually appear there was just a very small opening remaining in the central portion of the wound. This is literally a pinpoint region. Overall he has done well and I'm very pleased in this regard. I think that likely one more week will see this area completely healed. 03/08/18 on evaluation  today patient appears to actually be doing very well in regard to his plantar foot ulcer. He has been tolerating the dressing changes without complication. Fortunately this appears to be completely healed at this point which is excellent news. Overall I'm extremely happy with how things have progressed. 06/21/18 on evaluation today patient presents for evaluation in our clinic concerning issues that he's having with you seeing the area of his right foot that is previously experienced issues with. Fortunately there does not appear to be any evidence of infection at this time which is good news. With that being said he tells me that he has noted the callous buildup of the past several months but it was only in the past roughly week that he has noted drainage from the callous. He's not having any significant discomfort other than the fact that he states walking on the area with the extensive callous seems to be an issue. 06/28/18 on evaluation today patient actually appears to be doing very well in regard to his plantar foot ulcer. He's been tolerating the dressing changes without complication. There is some callous buildup though not nearly as much as noted last [name], [name]. ([id number]) week. Subsequently there is some need for sharp debridement today. 07/05/18 on evaluation today patient appears to be doing much better in regard to his plantar foot ulcer. This is not completely healed yet but it is thinking this morning and appears better than last week's evaluation. He has been tolerating the dressing changes without complication. 07/12/18 on evaluation today patient's foot ulcer actually appears to be showing some progress but again is not completely healed at this point. He is gonna require some sharp debridement today unfortunately. Electronic Signature(s) Signed: 07/13/2018 10:45:49 PM By: Worthy Keeler PA-C Entered By: Worthy Keeler on 07/12/2018 10:05:41 [name], [name]  ([id number]) -------------------------------------------------------------------------------- Physical Exam Details Patient Name: [name], [name]. Date of Service: 07/12/2018 8:15 AM Medical Record Number: [id number] Patient Account Number: [id number] Date of Birth/Sex: 1950/12/14 (68 y.o. M) Treating RN: Montey Hora Primary Care Provider: Benita Stabile Other Clinician: Referring Provider: TATE, Sharlet Salina Treating Provider/Extender: STONE III, HOYT Weeks in Treatment: 3 Constitutional Well-nourished and well-hydrated in no acute distress. Respiratory normal breathing without difficulty. Psychiatric this patient is able to make decisions and demonstrates good insight into disease process. Alert and Oriented x 3. pleasant and cooperative. Notes Patient's wound bed did require sharp debridement I was able to clear away [facility], callous tissue, and this was all cleaned down to good subcutaneous tissue. He did have some bleeding but fortunately nothing that could not be controlled with pressure. Electronic Signature(s) Signed: 07/13/2018 10:45:49 PM By: Worthy Keeler PA-C Entered By: Worthy Keeler on 07/12/2018 10:06:10 [name], [name] ([id number]) -------------------------------------------------------------------------------- Physician Orders Details Patient Name: EIAN, VANDERVELDEN. Date of Service: 07/12/2018 8:15 AM Medical Record Number: [id number] Patient Account Number: [id number] Date of Birth/Sex: 1950/11/06 (68 y.o. M) Treating RN: Montey Hora Primary Care Provider: Benita Stabile Other Clinician: Referring Provider: Benita Stabile Treating Provider/Extender: Melburn Hake, HOYT Weeks in Treatment: 3 Verbal / Phone Orders:  No Diagnosis Coding ICD-10 Coding Code Description E11.621 Type 2 diabetes mellitus with foot ulcer L97.512 Non-pressure chronic ulcer of other part of right foot with fat layer exposed I10 Essential (primary) hypertension I48.20 Chronic atrial fibrillation,  unspecified Wound Cleansing Wound #4 Right,Plantar Foot o Clean wound with Normal Saline. Anesthetic (add to Medication List) Wound #4 Right,Plantar Foot o Topical Lidocaine 4% cream applied to wound bed prior to debridement (In Clinic Only). Primary Wound Dressing Wound #4 Right,Plantar Foot o Silver Collagen Secondary Dressing Wound #4 [place] Dressing Change Frequency Wound #4 Right,Plantar Foot o Change dressing every other day. Follow-up Appointments Wound #4 Right,Plantar Foot o Return Appointment in 1 week. Edema Control Wound #4 Right,Plantar Foot o Elevate legs to the level of the heart and pump ankles as often as possible Off-Loading Wound #4 Right,Plantar Foot o Open toe surgical shoe with peg assist. - right Additional Orders / Instructions SANDY, BLOUCH. ([id number]) Wound #4 Right,Plantar Foot o Increase protein intake. Electronic Signature(s) Signed: 07/12/2018 4:25:12 PM By: Montey Hora Signed: 07/13/2018 10:45:49 PM By: Worthy Keeler PA-C Entered By: Montey Hora on 07/12/2018 08:58:19 [name], [name] ([id number]) -------------------------------------------------------------------------------- Problem List Details Patient Name: [name], [name]. Date of Service: 07/12/2018 8:15 AM Medical Record Number: [id number] Patient Account Number: [id number] Date of Birth/Sex: Dec 24, 1950 (68 y.o. M) Treating RN: Montey Hora Primary Care Provider: Benita Stabile Other Clinician: Referring Provider: Benita Stabile Treating Provider/Extender: Melburn Hake, HOYT Weeks in Treatment: 3 Active Problems ICD-10 Evaluated Encounter Code Description Active Date Today Diagnosis E11.621 Type 2 diabetes mellitus with foot ulcer 06/21/2018 No Yes L97.512 Non-pressure chronic ulcer of other part of right foot with fat 06/21/2018 No Yes layer exposed I10 Essential (primary) hypertension 06/21/2018 No Yes I48.20 Chronic atrial fibrillation,  unspecified 06/21/2018 No Yes Inactive Problems Resolved Problems Electronic Signature(s) Signed: 07/13/2018 10:45:49 PM By: Worthy Keeler PA-C Entered By: Worthy Keeler on 07/12/2018 08:15:49 [name], [name] ([id number]) -------------------------------------------------------------------------------- Progress Note Details Patient Name: [name]. Date of Service: 07/12/2018 8:15 AM Medical Record Number: [id number] Patient Account Number: [id number] Date of Birth/Sex: 10/05/1950 (68 y.o. M) Treating RN: Montey Hora Primary Care Provider: Benita Stabile Other Clinician: Referring Provider: Benita Stabile Treating Provider/Extender: Melburn Hake, HOYT Weeks in Treatment: 3 Subjective Chief Complaint Information obtained from Patient Right plantar foot ulcer History of Present Illness (HPI) The following HPI elements were documented for the patient's wound: Associated Signs and Symptoms: Patient has a history of diabetes mellitus type II, hypertension, and atrial fibrillation. He also has a hammertoe deformity of the bilateral feet which seems to be causing pressure in the ball of his foot. 03/09/17 on evaluation today patient presents for his initial evaluation concerning an ulcer on the plantar aspect of his right foot which has been open he tells me for about three weeks. He has been seen at [facility] where they did perform debridement it appears according to a note on 02/22/17 where unfortunately it appears that his callous area began to break down into an ulcer after modifications have been added to his insulin. This had happened previously as well. However I do not have details of the severity of debridement at that point although it does not sound as if you the debridement was too significant based on what the patient is telling me that he still had a lot of callous following debridement. With that being said they were going to look into altering his insoles to try to prevent  further breakdown in the future. Nonetheless he has had foul odor discharge coming from the ulcer which is noted all the way back to that visit on [facility]. Patient states that this was concerning him more than anything else. He does have diabetes although he described this as "borderline diabetes" he is on metoprolol however along with lisinopril. Patient is not having any issues with pain in regard to his right plantar foot. 03/16/17 on evaluation today patient appears to be doing much better in regard to his plantar foot ulcer. He actually tells me that he loves the peg assist offloading shoe and that he hasn't had any pain in the ulcer area from the callous since I worked on it last week. Overall he is extremely happy with how things have progressed. Likewise the wound bed has no slough noted there is no evidence of infection and it looks excellent. I did receive the results of his hemoglobin A1c which showed a value of 5.9 which was elevated but actually rather well. Subsequently I did also receive the x-ray of his foot which showed diffuse degenerative change but no underlying acute bony abnormality. 03/23/17 on evaluation today patient appears to be doing better in regard to his right plantar foot ulcer. He continues to show signs of improvement there's definitely not as much drainage at this point. He has been tolerating the dressing changes without complication he is not happy with the peg assist offloading shoe but at the same time I do believe that it is making good progress as far as offloading is concerned. I still believe he may need to talk to a surgeon about surgically correcting a hammertoe in order to avoid additional pressure to the site especially since he Artie has diabetic shoes and they do not seem to have prevented callous buildup in ulcer formation. 03/30/17 on evaluation today patient appears to be doing better in regard to his right plantar foot ulcer.  Continues to show signs of improvement which is good news. Unfortunately we were unable to get the appointment with the orthopedic, Dr. Doran Durand, whom I recommended for him due to patient having a balance at the practice. With that being said patient's foot ulcer does seem to be doing much better on evaluation today he still has some depth to the wound but overall we are seeing improvement and epithelialization week by week. Hopefully this is something that will close shortly. 04/05/17 on evaluation today patient unfortunately has what appears to be an area of the plantar surface of his ulcer where he had fluid collection where the callous grew over top of the wound bed. Unfortunately there was some pus like material noted during cleansing that we did sent for culture today. Hopefully this is not truly an infection is or does not appear to be any [name], RUESCH. ([id number]) evidence of erythema surrounding and maybe this is just simply a small setback with a fluid collection that has caused this issue. Nonetheless we will see what that shows when we get the culture back. I am also going to have it completely antibiotic which I previously placed him on anyway which will hopefully prevent any true infection from setting in. 04/12/17 on evaluation today patient's ulcer on the plantar aspect of his right foot actually appears to be better then during last weeks evaluation. In general he has been tolerating the dressing changes in utilizing the offloading shoe which does seem to be beneficial for him. With that being said he  still seems to get some pressure to the area just not nearly as much as he previously had noted. Again fortunately there is no significant pain. 04/19/17 on evaluation today patient appears to be doing excellent in regard to his right foot ulcer. He has been tolerating the dressing changes without complication. There really has not been any drainage over the past few days he tells me.  With that being said he seems to be doing excellent in my opinion at this point he does have some callous buildup. 04/26/17 On evaluation today patient's wound appears to be completely healed which is great news. He has been tolerating the dressing changes without complication and I do think he has done very well in regard to the healing process and offloading he has listed everything that that we instructed him to do. Obviously this appears to have paid off and he has progressed very nicely. Readmission: 07/05/17 on evaluation today patient is seen for fault evaluation and our clinic concerning the same issue that I have previously treated him for ending back in April 2019. This is a callous region with subsequently an ulcer underneath this on the plantar aspect of his right second metatarsal region. Fortunately he's not having any significant discomfort although his girlfriend told him that she noted an area of bruising at the site underneath the callous and wanted him to come get this checked out. He was very please with our care previous and therefore was more than happy to come let us check this for him. Upon inspection initially he did have significant callous overlying the area in question. It was not easily identifiable as far as any ulcer was concerned. With that being said he with this significant callous did require some sharp debridement and following debridement there did appear to be a small ulcer underlying. Fortunately patient in general shows no signs of infection at this point. He again has neuropathy and therefore does not have any ongoing discomfort or pain at this point. 07/12/17 on evaluation today patient actually appears to be doing very well in regard to his plantar foot ulcer. In fact this appears to be completely healed and there's no residual opening at this time. Overall I'm very pleased in this regard. Patient also is extremely pleased. Readmission: 02/08/18 patient  presents today for follow-up evaluation he has previously been seen here in our office for the same issue. Fortunately there is not been any evidence of infection since I last saw him he does note that he is been going to see his local podiatrist for callous pairing. During that time they actually noted that he had an open wound and subsequently referred him back ties for evaluation of this wound. He has done very well in the past with good wound care often healing in just a few weeks. No fevers, chills, nausea, or vomiting noted at this time. 02/15/18 on evaluation today patient actually appears to be doing very well in regard to his plantar foot ulcer. He is been tolerating the dressing changes without complication. There does appear to be some eschar of the line the wound surface. Fortunately I am happy with how things are progressing from the standpoint of the wound progress from last week to this week. 02/22/18 on evaluation today patient appears to be doing very well in regard to his plantar foot ulcer. He did have callous buildup around the area that required some sharp debridement today. He tolerated the debridement without complication. Post debridement the wound bed appears  to be doing much better there's just a very tiny opening still remaining. 03/01/18 on evaluation today patient appears to be doing excellent in regard to his plantar foot ulcer. In fact after I debrided away what appeared to be a dried blood clot on the surface and actually appear there was just a very small opening remaining in the central portion of the wound. This is literally a pinpoint region. Overall he has done well and I'm very pleased in this regard. I think that likely one more week will see this area completely healed. 03/08/18 on evaluation today patient appears to actually be doing very well in regard to his plantar foot ulcer. He has been tolerating the dressing changes without complication. Fortunately this  appears to be completely healed at this point which is excellent news. Overall I'm extremely happy with how things have progressed. CALLIE, BUNYARD ([id number]) 06/21/18 on evaluation today patient presents for evaluation in our clinic concerning issues that he's having with you seeing the area of his right foot that is previously experienced issues with. Fortunately there does not appear to be any evidence of infection at this time which is good news. With that being said he tells me that he has noted the callous buildup of the past several months but it was only in the past roughly week that he has noted drainage from the callous. He's not having any significant discomfort other than the fact that he states walking on the area with the extensive callous seems to be an issue. 06/28/18 on evaluation today patient actually appears to be doing very well in regard to his plantar foot ulcer. He's been tolerating the dressing changes without complication. There is some callous buildup though not nearly as much as noted last week. Subsequently there is some need for sharp debridement today. 07/05/18 on evaluation today patient appears to be doing much better in regard to his plantar foot ulcer. This is not completely healed yet but it is thinking this morning and appears better than last week's evaluation. He has been tolerating the dressing changes without complication. 07/12/18 on evaluation today patient's foot ulcer actually appears to be showing some progress but again is not completely healed at this point. He is gonna require some sharp debridement today unfortunately. Patient History Information obtained from Patient. Family History Cancer - Siblings, Diabetes - Father, Heart Disease - Mother, Hypertension - Mother, No family history of Hereditary Spherocytosis, Kidney Disease, Lung Disease, Seizures, Stroke, Thyroid Problems, Tuberculosis. Social History Former smoker, Marital Status - Single,  Alcohol Use - Never, Drug Use - No History, Caffeine Use - Moderate. Medical History Eyes Denies history of Cataracts, Glaucoma, Optic Neuritis Ear/Nose/Mouth/Throat Denies history of Chronic sinus problems/congestion, Middle ear problems Hematologic/Lymphatic Denies history of Anemia, Hemophilia, Human Immunodeficiency Virus, Lymphedema, Sickle Cell Disease Respiratory Denies history of Aspiration, Asthma, Chronic Obstructive Pulmonary Disease (COPD), Pneumothorax, Sleep Apnea, Tuberculosis Cardiovascular Patient has history of Arrhythmia - a fib, Hypertension Denies history of Angina, Congestive Heart Failure, Coronary Artery Disease, Deep Vein Thrombosis, Hypotension, Myocardial Infarction, Peripheral Arterial Disease, Peripheral Venous Disease, Phlebitis, Vasculitis Gastrointestinal Denies history of Cirrhosis , Colitis, Crohn s, Hepatitis A, Hepatitis B, Hepatitis C Endocrine Patient has history of Type II Diabetes Denies history of Type I Diabetes Genitourinary Denies history of End Stage Renal Disease Immunological Denies history of Lupus Erythematosus, Raynaud s, Scleroderma Integumentary (Skin) Denies history of History of Burn, History of pressure wounds Musculoskeletal Patient has history of Osteoarthritis Denies history of Gout, Rheumatoid  Arthritis, Osteomyelitis Neurologic Denies history of Dementia, Neuropathy, Quadriplegia, Paraplegia, Seizure Disorder Oncologic NAKEEM, MURNANE ([id number]) Denies history of Received Chemotherapy, Received Radiation Medical And Surgical History Notes Cardiovascular High Cholesterol Review of Systems (ROS) Constitutional Symptoms (General Health) Denies complaints or symptoms of Fatigue, Fever, Chills, Marked Weight Change. Respiratory Denies complaints or symptoms of Chronic or frequent coughs, Shortness of Breath. Cardiovascular Denies complaints or symptoms of Chest pain, LE edema. Psychiatric Denies complaints or  symptoms of Anxiety, Claustrophobia. Objective Constitutional Well-nourished and well-hydrated in no acute distress. Vitals Time Taken: 8:15 AM, Height: 74 in, Weight: 233 lbs, BMI: 29.9, Temperature: 98.2 F, Pulse: 92 bpm, Respiratory Rate: 18 breaths/min, Blood Pressure: 149/92 mmHg. Respiratory normal breathing without difficulty. Psychiatric this patient is able to make decisions and demonstrates good insight into disease process. Alert and Oriented x 3. pleasant and cooperative. General Notes: Patient's wound bed did require sharp debridement I was able to clear away [facility], callous tissue, and this was all cleaned down to good subcutaneous tissue. He did have some bleeding but fortunately nothing that could not be controlled with pressure. Integumentary (Hair, Skin) Wound #4 status is Open. Original cause of wound was Pressure Injury. The wound is located on the [place]. The wound measures 0.2cm length x 0.3cm width x 0.3cm depth; 0.047cm^2 area and 0.014cm^3 volume. There is Fat Layer (Subcutaneous Tissue) Exposed exposed. There is no tunneling noted, however, there is undermining starting at 10:00 and ending at 2:00 with a maximum distance of 0.4cm. There is a medium amount of sanguinous drainage noted. The wound margin is indistinct and nonvisible. There is large (67-100%) red granulation within the wound bed. There is a small (1-33%) amount of necrotic tissue within the wound bed including Adherent Slough. Assessment DEMARKIS, GHEEN ([id number]) Active Problems ICD-10 Type 2 diabetes mellitus with foot ulcer Non-pressure chronic ulcer of other part of right foot with fat layer exposed Essential (primary) hypertension Chronic atrial fibrillation, unspecified Procedures Wound #4 Pre-procedure diagnosis of Wound #4 is a Diabetic Wound/Ulcer of the Lower Extremity located on the Right,Plantar Foot .Severity of Tissue Pre Debridement is: Fat layer exposed. There was  a Excisional Skin/Subcutaneous Tissue Debridement with a total area of 0.06 sq cm performed by STONE III, HOYT E., PA-C. With the following instrument(s): Curette to remove Viable and Non-Viable tissue/material. Material removed includes Callus, Subcutaneous Tissue, and Slough after achieving pain control using Lidocaine 4% Topical Solution. No specimens were taken. A time out was conducted at 08:52, prior to the start of the procedure. A Minimum amount of bleeding was controlled with Pressure. The procedure was tolerated well with a pain level of 0 throughout and a pain level of 0 following the procedure. Post Debridement Measurements: 0.4cm length x 0.4cm width x 0.5cm depth; 0.063cm^3 volume. Character of Wound/Ulcer Post Debridement is improved. Severity of Tissue Post Debridement is: Fat layer exposed. Post procedure Diagnosis Wound #4: Same as Pre-Procedure Plan Wound Cleansing: Wound #4 Right,Plantar Foot: Clean wound with Normal Saline. Anesthetic (add to Medication List): Wound #4 Right,Plantar Foot: Topical Lidocaine 4% cream applied to wound bed prior to debridement (In Clinic Only). Primary Wound Dressing: Wound #4 Right,Plantar Foot: Silver Collagen Secondary Dressing: Wound #4 Right,Plantar Foot: [place] Dressing Change Frequency: Wound #4 Right,Plantar Foot: Change dressing every other day. Follow-up Appointments: Wound #4 Right,Plantar Foot: Return Appointment in 1 week. Edema Control: Wound #4 Right,Plantar Foot: Elevate legs to the level of the heart and pump ankles as often as possible Off-Loading: Wound #  4 Right,Plantar Foot: Open toe surgical shoe with peg assist. - right Brinkley, Dexter ([id number]) Additional Orders / Instructions: Wound #4 Right,Plantar Foot: Increase protein intake. My suggestion at this point is gonna be that we go ahead and initiate the above wound care measures for the next week and the patient is in agreement with plan. We will  subsequently see were things that at follow-up. If he has any other concerns or questions that arise in the interim he will contact the office and let me know. Please see above for specific wound care orders. We will see patient for re-evaluation in 1 week(s) here in the clinic. If anything worsens or changes patient will contact our office for additional recommendations. Electronic Signature(s) Signed: 07/13/2018 10:45:49 PM By: Worthy Keeler PA-C Entered By: Worthy Keeler on 07/12/2018 10:06:27 TONNIE, FRIEDEL ([id number]) -------------------------------------------------------------------------------- ROS/PFSH Details Patient Name: GARIN, MATA. Date of Service: 07/12/2018 8:15 AM Medical Record Number: [id number] Patient Account Number: [id number] Date of Birth/Sex: 1951-01-01 (68 y.o. M) Treating RN: Montey Hora Primary Care Provider: Benita Stabile Other Clinician: Referring Provider: Benita Stabile Treating Provider/Extender: STONE III, HOYT Weeks in Treatment: 3 Information Obtained From Patient Constitutional Symptoms (General Health) Complaints and Symptoms: Negative for: Fatigue; Fever; Chills; Marked Weight Change Respiratory Complaints and Symptoms: Negative for: Chronic or frequent coughs; Shortness of Breath Medical History: Negative for: Aspiration; Asthma; Chronic Obstructive Pulmonary Disease (COPD); Pneumothorax; Sleep Apnea; Tuberculosis Cardiovascular Complaints and Symptoms: Negative for: Chest pain; LE edema Medical History: Positive for: Arrhythmia - a fib; Hypertension Negative for: Angina; Congestive Heart Failure; Coronary Artery Disease; Deep Vein Thrombosis; Hypotension; Myocardial Infarction; Peripheral Arterial Disease; Peripheral Venous Disease; Phlebitis; Vasculitis Past Medical History Notes: High Cholesterol Psychiatric Complaints and Symptoms: Negative for: Anxiety; Claustrophobia Eyes Medical History: Negative for: Cataracts; Glaucoma;  Optic Neuritis Ear/Nose/Mouth/Throat Medical History: Negative for: Chronic sinus problems/congestion; Middle ear problems Hematologic/Lymphatic Medical History: Negative for: Anemia; Hemophilia; Human Immunodeficiency Virus; Lymphedema; Sickle Cell Disease Gastrointestinal NIZAR, CUTLER. ([id number]) Medical History: Negative for: Cirrhosis ; Colitis; Crohnos; Hepatitis A; Hepatitis B; Hepatitis C Endocrine Medical History: Positive for: Type II Diabetes Negative for: Type I Diabetes Time with diabetes: 3 years Treated with: Oral agents Blood sugar tested every day: No Genitourinary Medical History: Negative for: End Stage Renal Disease Immunological Medical History: Negative for: Lupus Erythematosus; Raynaudos; Scleroderma Integumentary (Skin) Medical History: Negative for: History of Burn; History of pressure wounds Musculoskeletal Medical History: Positive for: Osteoarthritis Negative for: Gout; Rheumatoid Arthritis; Osteomyelitis Neurologic Medical History: Negative for: Dementia; Neuropathy; Quadriplegia; Paraplegia; Seizure Disorder Oncologic Medical History: Negative for: Received Chemotherapy; Received Radiation Immunizations Pneumococcal Vaccine: Received Pneumococcal Vaccination: Yes Immunization Notes: up to date Implantable Devices No devices added Family and Social History Cancer: Yes - Siblings; Diabetes: Yes - Father; Heart Disease: Yes - Mother; Hereditary Spherocytosis: No; Hypertension: Yes - Mother; Kidney Disease: No; Lung Disease: No; Seizures: No; Stroke: No; Thyroid Problems: No; Tuberculosis: No; Former smoker; Marital Status - Single; Alcohol Use: Never; Drug Use: No History; Caffeine Use: Moderate; Financial Concerns: No; Food, Clothing or Shelter Needs: No; Support System Lacking: No; Transportation Concerns: No DAMIR, LEUNG ([id number]) Physician Affirmation I have reviewed and agree with the above information. Electronic  Signature(s) Signed: 07/12/2018 4:25:12 PM By: Montey Hora Signed: 07/13/2018 10:45:49 PM By: Worthy Keeler PA-C Entered By: Worthy Keeler on 07/12/2018 10:05:57 Evon, [name] ([id number]) -------------------------------------------------------------------------------- SuperBill Details Patient Name: RENARD, CAPERTON. Date of Service: 07/12/2018 Medical Record Number: [id number] Patient Account  Number: [id number] Date of Birth/Sex: 07-07-1950 (68 y.o. M) Treating RN: Montey Hora Primary Care Provider: Benita Stabile Other Clinician: Referring Provider: Benita Stabile Treating Provider/Extender: Melburn Hake, HOYT Weeks in Treatment: 3 Diagnosis Coding ICD-10 Codes Code Description E11.621 Type 2 diabetes mellitus with foot ulcer L97.512 Non-pressure chronic ulcer of other part of right foot with fat layer exposed I10 Essential (primary) hypertension I48.20 Chronic atrial fibrillation, unspecified Facility Procedures CPT4 Code: [id number] Description: 11042 - DEB SUBQ TISSUE 20 SQ CM/< ICD-10 Diagnosis Description L97.512 Non-pressure chronic ulcer of other part of right foot with fa Modifier: t layer exposed Quantity: 1 Physician Procedures CPT4 Code: [id number] Description: 11042 - WC PHYS SUBQ TISS 20 SQ CM ICD-10 Diagnosis Description L97.512 Non-pressure chronic ulcer of other part of right foot with fa Modifier: t layer exposed Quantity: 1 Electronic Signature(s) Signed: 07/13/2018 10:45:49 PM By: Worthy Keeler PA-C Entered By: Worthy Keeler on 07/12/2018 10:06:35

## 2018-07-18 ENCOUNTER — Encounter: Payer: PPO | Admitting: Physician Assistant

## 2018-07-18 ENCOUNTER — Other Ambulatory Visit: Payer: Self-pay

## 2018-07-18 DIAGNOSIS — E11621 Type 2 diabetes mellitus with foot ulcer: Secondary | ICD-10-CM | POA: Diagnosis not present

## 2018-07-18 DIAGNOSIS — L97512 Non-pressure chronic ulcer of other part of right foot with fat layer exposed: Secondary | ICD-10-CM | POA: Diagnosis not present

## 2018-07-20 NOTE — Progress Notes (Signed)
DJON, TITH ([id number]) Visit Report for 07/18/2018 Arrival Information Details Patient Name: [name], [name]. Date of Service: 07/18/2018 12:45 PM Medical Record Number: [id number] Patient Account Number: [id number] Date of Birth/Sex: Feb 21, 1950 (68 y.o. M) Treating RN: Cornell Barman Primary Care Arhaan Chesnut: Benita Stabile Other Clinician: Referring Gisella Alwine: Benita Stabile Treating Lively Haberman/Extender: Melburn Hake, HOYT Weeks in Treatment: 3 Visit Information History Since Last Visit Added or deleted any medications: No Patient Arrived: Ambulatory Any new allergies or adverse reactions: No Arrival Time: 12:47 Had a fall or experienced change in No Accompanied By: self activities of daily living that may affect Transfer Assistance: None risk of falls: Patient Identification Verified: Yes Signs or symptoms of abuse/neglect since last visito No Secondary Verification Process Completed: Yes Hospitalized since last visit: No Implantable device outside of the clinic excluding No cellular tissue based products placed in the center since last visit: Has Dressing in Place as Prescribed: Yes Pain Present Now: No Electronic Signature(s) Signed: 07/18/2018 4:46:34 PM By: Gretta Cool, BSN, RN, CWS, Kim RN, BSN Entered By: Gretta Cool, BSN, RN, CWS, Kim on 07/18/2018 12:48:07 [name] ([id number]) -------------------------------------------------------------------------------- Encounter Discharge Information Details Patient Name: [name]. Date of Service: 07/18/2018 12:45 PM Medical Record Number: [id number] Patient Account Number: [id number] Date of Birth/Sex: 01-Aug-1950 (68 y.o. M) Treating RN: Army Melia Primary Care Lauralynn Loeb: Benita Stabile Other Clinician: Referring Miyah Hampshire: TATE, Sharlet Salina Treating Akiem Urieta/Extender: Melburn Hake, HOYT Weeks in Treatment: 3 Encounter Discharge Information Items Post Procedure Vitals Discharge Condition: Stable Temperature (F): 98.6 Ambulatory Status:  Ambulatory Pulse (bpm): 86 Discharge Destination: Home Respiratory Rate (breaths/min): 16 Transportation: Private Auto Blood Pressure (mmHg): 176/86 Accompanied By: self Schedule Follow-up Appointment: Yes Clinical Summary of Care: Electronic Signature(s) Signed: 07/18/2018 3:40:05 PM By: Army Melia Entered By: Army Melia on 07/18/2018 13:22:26 Sox, [name] ([id number]) -------------------------------------------------------------------------------- Lower Extremity Assessment Details Patient Name: [name]. Date of Service: 07/18/2018 12:45 PM Medical Record Number: [id number] Patient Account Number: [id number] Date of Birth/Sex: 1950-05-07 (68 y.o. M) Treating RN: Cornell Barman Primary Care Kamaron Deskins: Benita Stabile Other Clinician: Referring Jeaninne Lodico: Benita Stabile Treating Naoma Boxell/Extender: Melburn Hake, HOYT Weeks in Treatment: 3 Vascular Assessment Pulses: Dorsalis Pedis Palpable: [Right:Yes] Electronic Signature(s) Signed: 07/18/2018 4:46:34 PM By: Gretta Cool, BSN, RN, CWS, Kim RN, BSN Entered By: Gretta Cool, BSN, RN, CWS, Kim on 07/18/2018 12:54:37 Gass, [name] ([id number]) -------------------------------------------------------------------------------- Multi Wound Chart Details Patient Name: [name], [name]. Date of Service: 07/18/2018 12:45 PM Medical Record Number: [id number] Patient Account Number: [id number] Date of Birth/Sex: 10/28/1950 (68 y.o. M) Treating RN: Harold Barban Primary Care Nycole Kawahara: Benita Stabile Other Clinician: Referring Maryetta Shafer: Benita Stabile Treating Hazell Siwik/Extender: STONE III, HOYT Weeks in Treatment: 3 Vital Signs Height(in): 74 Pulse(bpm): 86 Weight(lbs): 233 Blood Pressure(mmHg): 142/80 Body Mass Index(BMI): 30 Temperature(F): 98.0 Respiratory Rate 16 (breaths/min): Photos: [N/A:N/A] Wound Location: Right Foot - Plantar N/A N/A Wounding Event: Pressure Injury N/A N/A Primary Etiology: Diabetic Wound/Ulcer of the N/A N/A Lower  Extremity Secondary Etiology: Pressure Ulcer N/A N/A Comorbid History: Arrhythmia, Hypertension, N/A N/A Type II Diabetes, Osteoarthritis Date Acquired: 06/13/2018 N/A N/A Weeks of Treatment: 3 N/A N/A Wound Status: Open N/A N/A Measurements L x W x D 0.2x0.2x0.2 N/A N/A (cm) Area (cm) : 0.031 N/A N/A Volume (cm) : 0.006 N/A N/A % Reduction in Area: 99.30% N/A N/A % Reduction in Volume: 99.90% N/A N/A Classification: Grade 2 N/A N/A Exudate Amount: Small N/A N/A Exudate Type: Sanguinous N/A N/A Exudate Color: red N/A N/A Wound Margin: Indistinct, nonvisible N/A N/A Granulation Amount:  Large (67-100%) N/A N/A Granulation Quality: Red N/A N/A Necrotic Amount: Small (1-33%) N/A N/A Exposed Structures: Fat Layer (Subcutaneous N/A N/A Tissue) Exposed: Yes Fascia: No Tendon: No Nations, Kyron J. ([id number]) Muscle: No Joint: No Bone: No Epithelialization: Small (1-33%) N/A N/A Treatment Notes Electronic Signature(s) Signed: 07/18/2018 4:40:23 PM By: Harold Barban Entered By: Harold Barban on 07/18/2018 13:08:20 Prestigiacomo, [name] ([id number]) -------------------------------------------------------------------------------- Multi-Disciplinary Care Plan Details Patient Name: [name]. Date of Service: 07/18/2018 12:45 PM Medical Record Number: [id number] Patient Account Number: [id number] Date of Birth/Sex: [birth year] (68 y.o. M) Treating RN: Harold Barban Primary Care Neriyah Cercone: Benita Stabile Other Clinician: Referring Breyson Kelm: Benita Stabile Treating Marykay Mccleod/Extender: Melburn Hake, HOYT Weeks in Treatment: 3 Active Inactive Necrotic Tissue Nursing Diagnoses: Impaired tissue integrity related to necrotic/devitalized tissue Goals: Necrotic/devitalized tissue will be minimized in the wound bed Date Initiated: 06/21/2018 Target Resolution Date: 06/21/2018 Goal Status: Active Patient/caregiver will verbalize understanding of reason and process for debridement of necrotic  tissue Date Initiated: 06/21/2018 Target Resolution Date: 06/21/2018 Goal Status: Active Interventions: Assess patient pain level pre-, during and post procedure and prior to discharge Treatment Activities: Apply topical anesthetic as ordered : 06/21/2018 Notes: Orientation to the Wound Care Program Nursing Diagnoses: Knowledge deficit related to the wound healing center program Goals: Patient/caregiver will verbalize understanding of the [facility] Date Initiated: 06/21/2018 Target Resolution Date: 06/21/2018 Goal Status: Active Interventions: Provide education on orientation to the wound center Notes: Pressure Nursing Diagnoses: Knowledge deficit related to management of pressures ulcers Goals: TONYA, CARLILE ([id number]) Patient will remain free from development of additional pressure ulcers Date Initiated: 06/21/2018 Target Resolution Date: 06/29/2018 Goal Status: Active Interventions: Assess: immobility, friction, shearing, incontinence upon admission and as needed Provide education on pressure ulcers Notes: Wound/Skin Impairment Nursing Diagnoses: Impaired tissue integrity Goals: Ulcer/skin breakdown will have a volume reduction of 30% by week 4 Date Initiated: 06/21/2018 Target Resolution Date: 07/21/2018 Goal Status: Active Interventions: Assess patient/caregiver ability to perform ulcer/skin care regimen upon admission and as needed Assess ulceration(s) every visit Treatment Activities: Referred to DME Kahron Kauth for dressing supplies : 06/21/2018 Skin care regimen initiated : 06/21/2018 Topical wound management initiated : 06/21/2018 Notes: Electronic Signature(s) Signed: 07/18/2018 4:40:23 PM By: Harold Barban Entered By: Harold Barban on 07/18/2018 13:08:10 Philbrick, [name] ([id number]) -------------------------------------------------------------------------------- Pain Assessment Details Patient Name: [name]. Date of Service: 07/18/2018 12:45  PM Medical Record Number: [id number] Patient Account Number: [id number] Date of Birth/Sex: 24-May-1950 (68 y.o. M) Treating RN: Cornell Barman Primary Care Mylon Mabey: Benita Stabile Other Clinician: Referring Yukari Flax: Benita Stabile Treating Gehrig Patras/Extender: Melburn Hake, HOYT Weeks in Treatment: 3 Active Problems Location of Pain Severity and Description of Pain Patient Has Paino No Site Locations Pain Management and Medication Current Pain Management: Goals for Pain Management Patient denies pain at this time. Electronic Signature(s) Signed: 07/18/2018 4:46:34 PM By: Gretta Cool, BSN, RN, CWS, Kim RN, BSN Entered By: Gretta Cool, BSN, RN, CWS, Kim on 07/18/2018 12:48:24 [name] ([id number]) -------------------------------------------------------------------------------- Patient/Caregiver Education Details Patient Name: ANTOINO, WESTHOFF. Date of Service: 07/18/2018 12:45 PM Medical Record Number: [id number] Patient Account Number: [id number] Date of Birth/Gender: 1950/02/16 (68 y.o. M) Treating RN: Harold Barban Primary Care Physician: Benita Stabile Other Clinician: Referring Physician: Benita Stabile Treating Physician/Extender: Sharalyn Ink in Treatment: 3 Education Assessment Education Provided To: Patient Education Topics Provided Wound/Skin Impairment: Handouts: Caring for Your Ulcer Methods: Demonstration, Explain/Verbal Responses: State content correctly Electronic Signature(s) Signed: 07/18/2018 4:40:23 PM By: Harold Barban Entered By: Oretha Ellis  Kristina on 07/18/2018 13:14:57 Saindon, MACEO HERNAN ([id number]) -------------------------------------------------------------------------------- Wound Assessment Details Patient Name: CHARISTOPHER, RUMBLE. Date of Service: 07/18/2018 12:45 PM Medical Record Number: [id number] Patient Account Number: [id number] Date of Birth/Sex: 1950/07/16 (68 y.o. M) Treating RN: Cornell Barman Primary Care Kaevion Sinclair: Benita Stabile Other Clinician: Referring Rafael Salway:  Benita Stabile Treating Tatiyana Foucher/Extender: Melburn Hake, HOYT Weeks in Treatment: 3 Wound Status Wound Number: 4 Primary Etiology: Diabetic Wound/Ulcer of the Lower Extremity Wound Location: Right Foot - Plantar Secondary Pressure Ulcer Wounding Event: Pressure Injury Etiology: Date Acquired: 06/13/2018 Wound Status: Open Weeks Of Treatment: 3 Comorbid Arrhythmia, Hypertension, Type II Diabetes, Clustered Wound: No History: Osteoarthritis Photos Wound Measurements Length: (cm) 0.2 Width: (cm) 0.2 Depth: (cm) 0.2 Area: (cm) 0.031 Volume: (cm) 0.006 % Reduction in Area: 99.3% % Reduction in Volume: 99.9% Epithelialization: Small (1-33%) Tunneling: No Undermining: No Wound Description Classification: Grade 2 Foul Odor A Wound Margin: Indistinct, nonvisible Slough/Fibr Exudate Amount: Small Exudate Type: Sanguinous Exudate Color: red fter Cleansing: No ino No Wound Bed Granulation Amount: Large (67-100%) Exposed Structure Granulation Quality: Red Fascia Exposed: No Necrotic Amount: Small (1-33%) Fat Layer (Subcutaneous Tissue) Exposed: Yes Necrotic Quality: Adherent Slough Tendon Exposed: No Muscle Exposed: No Joint Exposed: No Bone Exposed: No Treatment Notes NAFTOLI, PENNY ([id number]) Wound #4 (Right, Plantar Foot) Notes [employer], [employer]) Signed: 07/18/2018 4:46:34 PM By: Gretta Cool, BSN, RN, CWS, Kim RN, BSN Entered By: Gretta Cool, BSN, RN, CWS, Kim on 07/18/2018 12:53:51 Donald, [name] ([id number]) -------------------------------------------------------------------------------- Vitals Details Patient Name: [name]. Date of Service: 07/18/2018 12:45 PM Medical Record Number: [id number] Patient Account Number: [id number] Date of Birth/Sex: 1950/12/31 (68 y.o. M) Treating RN: Cornell Barman Primary Care Lashanna Angelo: Benita Stabile Other Clinician: Referring Milarose Savich: Benita Stabile Treating Rebecca Cairns/Extender: Melburn Hake, HOYT Weeks in Treatment:  3 Vital Signs Time Taken: 12:48 Temperature (F): 98.0 Height (in): 74 Pulse (bpm): 86 Weight (lbs): 233 Respiratory Rate (breaths/min): 16 Body Mass Index (BMI): 29.9 Blood Pressure (mmHg): 142/80 Reference Range: 80 - 120 mg / dl Electronic Signature(s) Signed: 07/18/2018 4:46:34 PM By: Gretta Cool, BSN, RN, CWS, Kim RN, BSN Entered By: Gretta Cool, BSN, RN, CWS, Kim on 07/18/2018 12:48:44

## 2018-07-20 NOTE — Progress Notes (Signed)
[name], [name] ([id number]) Visit Report for 07/18/2018 Chief Complaint Document Details Patient Name: [name], [name]. Date of Service: 07/18/2018 12:45 PM Medical Record Number: [id number] Patient Account Number: [id number] Date of Birth/Sex: 1950-04-25 (68 y.o. M) Treating RN: Harold Barban Primary Care Provider: Benita Stabile Other Clinician: Referring Provider: Benita Stabile Treating Provider/Extender: Melburn Hake, Mayra Jolliffe Weeks in Treatment: 3 Information Obtained from: Patient Chief Complaint Right plantar foot ulcer Electronic Signature(s) Signed: 07/20/2018 12:14:37 AM By: Worthy Keeler PA-C Entered By: Worthy Keeler on 07/18/2018 12:47:33 Hughlett, [name] ([id number]) -------------------------------------------------------------------------------- Debridement Details Patient Name: [name]. Date of Service: 07/18/2018 12:45 PM Medical Record Number: [id number] Patient Account Number: [id number] Date of Birth/Sex: 11-04-1950 (68 y.o. M) Treating RN: Harold Barban Primary Care Provider: Benita Stabile Other Clinician: Referring Provider: Benita Stabile Treating Provider/Extender: Melburn Hake, Taylen Osorto Weeks in Treatment: 3 Debridement Performed for Wound #4 Right,Plantar Foot Assessment: Performed By: Physician STONE III, Dorlisa Savino E., PA-C Debridement Type: Debridement Severity of Tissue Pre Fat layer exposed Debridement: Level of Consciousness (Pre- Awake and Alert procedure): Pre-procedure Verification/Time Yes - 13:09 Out Taken: Start Time: 13:09 Pain Control: Lidocaine Total Area Debrided (L x W): 0.2 (cm) x 0.2 (cm) = 0.04 (cm) Tissue and other material Non-Viable, Callus, Slough, Subcutaneous, Slough debrided: Level: Skin/Subcutaneous Tissue Debridement Description: Excisional Instrument: Curette Bleeding: None End Time: 13:14 Procedural Pain: 0 Post Procedural Pain: 0 Response to Treatment: Procedure was tolerated well Level of Consciousness Awake and  Alert (Post-procedure): Post Debridement Measurements of Total Wound Length: (cm) 0.2 Width: (cm) 0.2 Depth: (cm) 0.2 Volume: (cm) 0.006 Character of Wound/Ulcer Post Debridement: Improved Severity of Tissue Post Debridement: Fat layer exposed Post Procedure Diagnosis Same as Pre-procedure Electronic Signature(s) Signed: 07/18/2018 4:40:23 PM By: Harold Barban Signed: 07/20/2018 12:14:37 AM By: Worthy Keeler PA-C Entered By: Harold Barban on 07/18/2018 13:14:40 Mickley, [name] ([id number]) -------------------------------------------------------------------------------- HPI Details Patient Name: [name]. Date of Service: 07/18/2018 12:45 PM Medical Record Number: [id number] Patient Account Number: [id number] Date of Birth/Sex: 04-02-1950 (68 y.o. M) Treating RN: Harold Barban Primary Care Provider: Benita Stabile Other Clinician: Referring Provider: Benita Stabile Treating Provider/Extender: Melburn Hake, Sharena Dibenedetto Weeks in Treatment: 3 History of Present Illness Associated Signs and Symptoms: Patient has a history of diabetes mellitus type II, hypertension, and atrial fibrillation. He also has a hammertoe deformity of the bilateral feet which seems to be causing pressure in the ball of his foot. HPI Description: 03/09/17 on evaluation today patient presents for his initial evaluation concerning an ulcer on the plantar aspect of his right foot which has been open he tells me for about three weeks. He has been seen at [facility] where they did perform debridement it appears according to a note on 02/22/17 where unfortunately it appears that his callous area began to break down into an ulcer after modifications have been added to his insulin. This had happened previously as well. However I do not have details of the severity of debridement at that point although it does not sound as if you the debridement was too significant based on what the patient is telling me that he still had  a lot of callous following debridement. With that being said they were going to look into altering his insoles to try to prevent further breakdown in the future. Nonetheless he has had foul odor discharge coming from the ulcer which is noted all the way back to that visit on [facility]. Patient states that  this was concerning him more than anything else. He does have diabetes although he described this as "borderline diabetes" he is on metoprolol however along with lisinopril. Patient is not having any issues with pain in regard to his right plantar foot. 03/16/17 on evaluation today patient appears to be doing much better in regard to his plantar foot ulcer. He actually tells me that he loves the peg assist offloading shoe and that he hasn't had any pain in the ulcer area from the callous since I worked on it last week. Overall he is extremely happy with how things have progressed. Likewise the wound bed has no slough noted there is no evidence of infection and it looks excellent. I did receive the results of his hemoglobin A1c which showed a value of 5.9 which was elevated but actually rather well. Subsequently I did also receive the x-ray of his foot which showed diffuse degenerative change but no underlying acute bony abnormality. 03/23/17 on evaluation today patient appears to be doing better in regard to his right plantar foot ulcer. He continues to show signs of improvement there's definitely not as much drainage at this point. He has been tolerating the dressing changes without complication he is not happy with the peg assist offloading shoe but at the same time I do believe that it is making good progress as far as offloading is concerned. I still believe he may need to talk to a surgeon about surgically correcting a hammertoe in order to avoid additional pressure to the site especially since he Artie has diabetic shoes and they do not seem to have prevented callous buildup  in ulcer formation. 03/30/17 on evaluation today patient appears to be doing better in regard to his right plantar foot ulcer. Continues to show signs of improvement which is good news. Unfortunately we were unable to get the appointment with the orthopedic, Dr. Doran Durand, whom I recommended for him due to patient having a balance at the practice. With that being said patient's foot ulcer does seem to be doing much better on evaluation today he still has some depth to the wound but overall we are seeing improvement and epithelialization week by week. Hopefully this is something that will close shortly. 04/05/17 on evaluation today patient unfortunately has what appears to be an area of the plantar surface of his ulcer where he had fluid collection where the callous grew over top of the wound bed. Unfortunately there was some pus like material noted during cleansing that we did sent for culture today. Hopefully this is not truly an infection is or does not appear to be any evidence of erythema surrounding and maybe this is just simply a small setback with a fluid collection that has caused this issue. Nonetheless we will see what that shows when we get the culture back. I am also going to have it completely antibiotic which I previously placed him on anyway which will hopefully prevent any true infection from setting in. 04/12/17 on evaluation today patient's ulcer on the plantar aspect of his right foot actually appears to be better then during last weeks evaluation. In general he has been tolerating the dressing changes in utilizing the offloading shoe which does seem to be beneficial for him. With that being said he still seems to get some pressure to the area just not nearly as much as he previously had noted. Again fortunately there is no significant pain. DEKLEN, POPELKA ([id number]) 04/19/17 on evaluation today patient appears to be doing  excellent in regard to his right foot ulcer. He has been  tolerating the dressing changes without complication. There really has not been any drainage over the past few days he tells me. With that being said he seems to be doing excellent in my opinion at this point he does have some callous buildup. 04/26/17 On evaluation today patient's wound appears to be completely healed which is great news. He has been tolerating the dressing changes without complication and I do think he has done very well in regard to the healing process and offloading he has listed everything that that we instructed him to do. Obviously this appears to have paid off and he has progressed very nicely. Readmission: 07/05/17 on evaluation today patient is seen for fault evaluation and our clinic concerning the same issue that I have previously treated him for ending back in April 2019. This is a callous region with subsequently an ulcer underneath this on the plantar aspect of his right second metatarsal region. Fortunately he's not having any significant discomfort although his girlfriend told him that she noted an area of bruising at the site underneath the callous and wanted him to come get this checked out. He was very please with our care previous and therefore was more than happy to come let us check this for him. Upon inspection initially he did have significant callous overlying the area in question. It was not easily identifiable as far as any ulcer was concerned. With that being said he with this significant callous did require some sharp debridement and following debridement there did appear to be a small ulcer underlying. Fortunately patient in general shows no signs of infection at this point. He again has neuropathy and therefore does not have any ongoing discomfort or pain at this point. 07/12/17 on evaluation today patient actually appears to be doing very well in regard to his plantar foot ulcer. In fact this appears to be completely healed and there's no residual  opening at this time. Overall I'm very pleased in this regard. Patient also is extremely pleased. Readmission: 02/08/18 patient presents today for follow-up evaluation he has previously been seen here in our office for the same issue. Fortunately there is not been any evidence of infection since I last saw him he does note that he is been going to see his local podiatrist for callous pairing. During that time they actually noted that he had an open wound and subsequently referred him back ties for evaluation of this wound. He has done very well in the past with good wound care often healing in just a few weeks. No fevers, chills, nausea, or vomiting noted at this time. 02/15/18 on evaluation today patient actually appears to be doing very well in regard to his plantar foot ulcer. He is been tolerating the dressing changes without complication. There does appear to be some eschar of the line the wound surface. Fortunately I am happy with how things are progressing from the standpoint of the wound progress from last week to this week. 02/22/18 on evaluation today patient appears to be doing very well in regard to his plantar foot ulcer. He did have callous buildup around the area that required some sharp debridement today. He tolerated the debridement without complication. Post debridement the wound bed appears to be doing much better there's just a very tiny opening still remaining. 03/01/18 on evaluation today patient appears to be doing excellent in regard to his plantar foot ulcer. In fact after I debrided  away what appeared to be a dried blood clot on the surface and actually appear there was just a very small opening remaining in the central portion of the wound. This is literally a pinpoint region. Overall he has done well and I'm very pleased in this regard. I think that likely one more week will see this area completely healed. 03/08/18 on evaluation today patient appears to actually be doing  very well in regard to his plantar foot ulcer. He has been tolerating the dressing changes without complication. Fortunately this appears to be completely healed at this point which is excellent news. Overall I'm extremely happy with how things have progressed. 06/21/18 on evaluation today patient presents for evaluation in our clinic concerning issues that he's having with you seeing the area of his right foot that is previously experienced issues with. Fortunately there does not appear to be any evidence of infection at this time which is good news. With that being said he tells me that he has noted the callous buildup of the past several months but it was only in the past roughly week that he has noted drainage from the callous. He's not having any significant discomfort other than the fact that he states walking on the area with the extensive callous seems to be an issue. 06/28/18 on evaluation today patient actually appears to be doing very well in regard to his plantar foot ulcer. He's been tolerating the dressing changes without complication. There is some callous buildup though not nearly as much as noted last EDSEL, SHIVES. ([id number]) week. Subsequently there is some need for sharp debridement today. 07/05/18 on evaluation today patient appears to be doing much better in regard to his plantar foot ulcer. This is not completely healed yet but it is thinking this morning and appears better than last week's evaluation. He has been tolerating the dressing changes without complication. 07/12/18 on evaluation today patient's foot ulcer actually appears to be showing some progress but again is not completely healed at this point. He is gonna require some sharp debridement today unfortunately. 07/18/18 on evaluation today patient appears to be doing about the same in regard to the plantar foot wound. Fortunately there's no signs of active infection. Unfortunately he hasn't made quite as much progress  as I'd like to have seen. I do believe that he is doing better than obviously when he started but we still have some work to do. I think a total contact cast may be something to consider if he does not make a lot of significant improvement over the next week. With that being said the one issue is gonna be that he is done with a wound on his right foot which is going to cause some issues with driving if he has to have a cast the need to have somebody drive him. He's not sure that even has this capability. Electronic Signature(s) Signed: 07/20/2018 12:14:37 AM By: Worthy Keeler PA-C Entered By: Worthy Keeler on 07/18/2018 13:19:59 Mceachin, [name] ([id number]) -------------------------------------------------------------------------------- Physical Exam Details Patient Name: LAURIER, JASPERSON. Date of Service: 07/18/2018 12:45 PM Medical Record Number: [id number] Patient Account Number: [id number] Date of Birth/Sex: 1950-05-30 (68 y.o. M) Treating RN: Harold Barban Primary Care Provider: Benita Stabile Other Clinician: Referring Provider: TATE, Sharlet Salina Treating Provider/Extender: STONE III, Dessie Tatem Weeks in Treatment: 3 Constitutional Well-nourished and well-hydrated in no acute distress. Respiratory normal breathing without difficulty. Psychiatric this patient is able to make decisions and demonstrates good insight into disease process.  Alert and Oriented x 3. pleasant and cooperative. Notes Upon inspection patient's wound bed actually showed signs of some Slough as well as callous buildup the did require sharp debridement today. I was able to clean away the necrotic tissue without complication post debridement wound bed appears to be doing much better which is excellent news. Overall there's no signs of active infection at this time. Electronic Signature(s) Signed: 07/20/2018 12:14:37 AM By: Worthy Keeler PA-C Entered By: Worthy Keeler on 07/18/2018 13:20:38 Bise, [name]  ([id number]) -------------------------------------------------------------------------------- Physician Orders Details Patient Name: AXYL, SITZMAN. Date of Service: 07/18/2018 12:45 PM Medical Record Number: [id number] Patient Account Number: [id number] Date of Birth/Sex: [birth year] (68 y.o. M) Treating RN: Harold Barban Primary Care Provider: Benita Stabile Other Clinician: Referring Provider: Benita Stabile Treating Provider/Extender: Melburn Hake, Niquita Digioia Weeks in Treatment: 3 Verbal / Phone Orders: No Diagnosis Coding ICD-10 Coding Code Description E11.621 Type 2 diabetes mellitus with foot ulcer L97.512 Non-pressure chronic ulcer of other part of right foot with fat layer exposed I10 Essential (primary) hypertension I48.20 Chronic atrial fibrillation, unspecified Wound Cleansing Wound #4 Right,Plantar Foot o Clean wound with Normal Saline. Anesthetic (add to Medication List) Wound #4 Right,Plantar Foot o Topical Lidocaine 4% cream applied to wound bed prior to debridement (In Clinic Only). Primary Wound Dressing Wound #4 Right,Plantar Foot o Silver Collagen Secondary Dressing Wound #4 [place] Dressing Change Frequency Wound #4 Right,Plantar Foot o Change dressing every other day. Follow-up Appointments Wound #4 Right,Plantar Foot o Return Appointment in 1 week. Edema Control Wound #4 Right,Plantar Foot o Elevate legs to the level of the heart and pump ankles as often as possible Off-Loading Wound #4 Right,Plantar Foot o Open toe surgical shoe with peg assist. - right Additional Orders / Instructions EWING, FANDINO. ([id number]) Wound #4 Right,Plantar Foot o Increase protein intake. Electronic Signature(s) Signed: 07/18/2018 4:40:23 PM By: Harold Barban Signed: 07/20/2018 12:14:37 AM By: Worthy Keeler PA-C Entered By: Harold Barban on 07/18/2018 13:15:43 Moro, [name]  ([id number]) -------------------------------------------------------------------------------- Problem List Details Patient Name: RHILEY, SOLEM. Date of Service: 07/18/2018 12:45 PM Medical Record Number: [id number] Patient Account Number: [id number] Date of Birth/Sex: 03-30-1950 (68 y.o. M) Treating RN: Harold Barban Primary Care Provider: Benita Stabile Other Clinician: Referring Provider: Benita Stabile Treating Provider/Extender: Melburn Hake, Wilmarie Sparlin Weeks in Treatment: 3 Active Problems ICD-10 Evaluated Encounter Code Description Active Date Today Diagnosis E11.621 Type 2 diabetes mellitus with foot ulcer 06/21/2018 No Yes L97.512 Non-pressure chronic ulcer of other part of right foot with fat 06/21/2018 No Yes layer exposed I10 Essential (primary) hypertension 06/21/2018 No Yes I48.20 Chronic atrial fibrillation, unspecified 06/21/2018 No Yes Inactive Problems Resolved Problems Electronic Signature(s) Signed: 07/20/2018 12:14:37 AM By: Worthy Keeler PA-C Entered By: Worthy Keeler on 07/18/2018 12:47:11 Pizzimenti, [name] ([id number]) -------------------------------------------------------------------------------- Progress Note Details Patient Name: [name]. Date of Service: 07/18/2018 12:45 PM Medical Record Number: [id number] Patient Account Number: [id number] Date of Birth/Sex: [birth year] (68 y.o. M) Treating RN: Harold Barban Primary Care Provider: Benita Stabile Other Clinician: Referring Provider: Benita Stabile Treating Provider/Extender: Melburn Hake, Khary Schaben Weeks in Treatment: 3 Subjective Chief Complaint Information obtained from Patient Right plantar foot ulcer History of Present Illness (HPI) The following HPI elements were documented for the patient's wound: Associated Signs and Symptoms: Patient has a history of diabetes mellitus type II, hypertension, and atrial fibrillation. He also has a hammertoe deformity of the bilateral feet which seems to be causing pressure in the  ball of his foot. 03/09/17 on evaluation today patient presents for his initial evaluation concerning an ulcer on the plantar aspect of his right foot which has been open he tells me for about three weeks. He has been seen at [facility] where they did perform debridement it appears according to a note on 02/22/17 where unfortunately it appears that his callous area began to break down into an ulcer after modifications have been added to his insulin. This had happened previously as well. However I do not have details of the severity of debridement at that point although it does not sound as if you the debridement was too significant based on what the patient is telling me that he still had a lot of callous following debridement. With that being said they were going to look into altering his insoles to try to prevent further breakdown in the future. Nonetheless he has had foul odor discharge coming from the ulcer which is noted all the way back to that visit [facility]. Patient states that this was concerning him more than anything else. He does have diabetes although he described this as "borderline diabetes" he is on metoprolol however along with lisinopril. Patient is not having any issues with pain in regard to his right plantar foot. 03/16/17 on evaluation today patient appears to be doing much better in regard to his plantar foot ulcer. He actually tells me that he loves the peg assist offloading shoe and that he hasn't had any pain in the ulcer area from the callous since I worked on it last week. Overall he is extremely happy with how things have progressed. Likewise the wound bed has no slough noted there is no evidence of infection and it looks excellent. I did receive the results of his hemoglobin A1c which showed a value of 5.9 which was elevated but actually rather well. Subsequently I did also receive the x-ray of his foot which showed diffuse degenerative change  but no underlying acute bony abnormality. 03/23/17 on evaluation today patient appears to be doing better in regard to his right plantar foot ulcer. He continues to show signs of improvement there's definitely not as much drainage at this point. He has been tolerating the dressing changes without complication he is not happy with the peg assist offloading shoe but at the same time I do believe that it is making good progress as far as offloading is concerned. I still believe he may need to talk to a surgeon about surgically correcting a hammertoe in order to avoid additional pressure to the site especially since he Artie has diabetic shoes and they do not seem to have prevented callous buildup in ulcer formation. 03/30/17 on evaluation today patient appears to be doing better in regard to his right plantar foot ulcer. Continues to show signs of improvement which is good news. Unfortunately we were unable to get the appointment with the orthopedic, Dr. Doran Durand, whom I recommended for him due to patient having a balance at the practice. With that being said patient's foot ulcer does seem to be doing much better on evaluation today he still has some depth to the wound but overall we are seeing improvement and epithelialization week by week. Hopefully this is something that will close shortly. 04/05/17 on evaluation today patient unfortunately has what appears to be an area of the plantar surface of his ulcer where he had fluid collection where the callous grew over top of the wound bed.  Unfortunately there was some pus like material noted during cleansing that we did sent for culture today. Hopefully this is not truly an infection is or does not appear to be any RYNELL, CIOTTI. ([id number]) evidence of erythema surrounding and maybe this is just simply a small setback with a fluid collection that has caused this issue. Nonetheless we will see what that shows when we get the culture back. I am also going to  have it completely antibiotic which I previously placed him on anyway which will hopefully prevent any true infection from setting in. 04/12/17 on evaluation today patient's ulcer on the plantar aspect of his right foot actually appears to be better then during last weeks evaluation. In general he has been tolerating the dressing changes in utilizing the offloading shoe which does seem to be beneficial for him. With that being said he still seems to get some pressure to the area just not nearly as much as he previously had noted. Again fortunately there is no significant pain. 04/19/17 on evaluation today patient appears to be doing excellent in regard to his right foot ulcer. He has been tolerating the dressing changes without complication. There really has not been any drainage over the past few days he tells me. With that being said he seems to be doing excellent in my opinion at this point he does have some callous buildup. 04/26/17 On evaluation today patient's wound appears to be completely healed which is great news. He has been tolerating the dressing changes without complication and I do think he has done very well in regard to the healing process and offloading he has listed everything that that we instructed him to do. Obviously this appears to have paid off and he has progressed very nicely. Readmission: 07/05/17 on evaluation today patient is seen for fault evaluation and our clinic concerning the same issue that I have previously treated him for ending back in April 2019. This is a callous region with subsequently an ulcer underneath this on the plantar aspect of his right second metatarsal region. Fortunately he's not having any significant discomfort although his girlfriend told him that she noted an area of bruising at the site underneath the callous and wanted him to come get this checked out. He was very please with our care previous and therefore was more than happy to come let us  check this for him. Upon inspection initially he did have significant callous overlying the area in question. It was not easily identifiable as far as any ulcer was concerned. With that being said he with this significant callous did require some sharp debridement and following debridement there did appear to be a small ulcer underlying. Fortunately patient in general shows no signs of infection at this point. He again has neuropathy and therefore does not have any ongoing discomfort or pain at this point. 07/12/17 on evaluation today patient actually appears to be doing very well in regard to his plantar foot ulcer. In fact this appears to be completely healed and there's no residual opening at this time. Overall I'm very pleased in this regard. Patient also is extremely pleased. Readmission: 02/08/18 patient presents today for follow-up evaluation he has previously been seen here in our office for the same issue. Fortunately there is not been any evidence of infection since I last saw him he does note that he is been going to see his local podiatrist for callous pairing. During that time they actually noted that he had an open  wound and subsequently referred him back ties for evaluation of this wound. He has done very well in the past with good wound care often healing in just a few weeks. No fevers, chills, nausea, or vomiting noted at this time. 02/15/18 on evaluation today patient actually appears to be doing very well in regard to his plantar foot ulcer. He is been tolerating the dressing changes without complication. There does appear to be some eschar of the line the wound surface. Fortunately I am happy with how things are progressing from the standpoint of the wound progress from last week to this week. 02/22/18 on evaluation today patient appears to be doing very well in regard to his plantar foot ulcer. He did have callous buildup around the area that required some sharp debridement today.  He tolerated the debridement without complication. Post debridement the wound bed appears to be doing much better there's just a very tiny opening still remaining. 03/01/18 on evaluation today patient appears to be doing excellent in regard to his plantar foot ulcer. In fact after I debrided away what appeared to be a dried blood clot on the surface and actually appear there was just a very small opening remaining in the central portion of the wound. This is literally a pinpoint region. Overall he has done well and I'm very pleased in this regard. I think that likely one more week will see this area completely healed. 03/08/18 on evaluation today patient appears to actually be doing very well in regard to his plantar foot ulcer. He has been tolerating the dressing changes without complication. Fortunately this appears to be completely healed at this point which is excellent news. Overall I'm extremely happy with how things have progressed. LADANIAN, KELTER ([id number]) 06/21/18 on evaluation today patient presents for evaluation in our clinic concerning issues that he's having with you seeing the area of his right foot that is previously experienced issues with. Fortunately there does not appear to be any evidence of infection at this time which is good news. With that being said he tells me that he has noted the callous buildup of the past several months but it was only in the past roughly week that he has noted drainage from the callous. He's not having any significant discomfort other than the fact that he states walking on the area with the extensive callous seems to be an issue. 06/28/18 on evaluation today patient actually appears to be doing very well in regard to his plantar foot ulcer. He's been tolerating the dressing changes without complication. There is some callous buildup though not nearly as much as noted last week. Subsequently there is some need for sharp debridement today. 07/05/18 on  evaluation today patient appears to be doing much better in regard to his plantar foot ulcer. This is not completely healed yet but it is thinking this morning and appears better than last week's evaluation. He has been tolerating the dressing changes without complication. 07/12/18 on evaluation today patient's foot ulcer actually appears to be showing some progress but again is not completely healed at this point. He is gonna require some sharp debridement today unfortunately. 07/18/18 on evaluation today patient appears to be doing about the same in regard to the plantar foot wound. Fortunately there's no signs of active infection. Unfortunately he hasn't made quite as much progress as I'd like to have seen. I do believe that he is doing better than obviously when he started but we still have some work to  do. I think a total contact cast may be something to consider if he does not make a lot of significant improvement over the next week. With that being said the one issue is gonna be that he is done with a wound on his right foot which is going to cause some issues with driving if he has to have a cast the need to have somebody drive him. He's not sure that even has this capability. Patient History Information obtained from Patient. Family History Cancer - Siblings, Diabetes - Father, Heart Disease - Mother, Hypertension - Mother, No family history of Hereditary Spherocytosis, Kidney Disease, Lung Disease, Seizures, Stroke, Thyroid Problems, Tuberculosis. Social History Former smoker, Marital Status - Single, Alcohol Use - Never, Drug Use - No History, Caffeine Use - Moderate. Medical History Eyes Denies history of Cataracts, Glaucoma, Optic Neuritis Ear/Nose/Mouth/Throat Denies history of Chronic sinus problems/congestion, Middle ear problems Hematologic/Lymphatic Denies history of Anemia, Hemophilia, Human Immunodeficiency Virus, Lymphedema, Sickle Cell Disease Respiratory Denies history  of Aspiration, Asthma, Chronic Obstructive Pulmonary Disease (COPD), Pneumothorax, Sleep Apnea, Tuberculosis Cardiovascular Patient has history of Arrhythmia - a fib, Hypertension Denies history of Angina, Congestive Heart Failure, Coronary Artery Disease, Deep Vein Thrombosis, Hypotension, Myocardial Infarction, Peripheral Arterial Disease, Peripheral Venous Disease, Phlebitis, Vasculitis Gastrointestinal Denies history of Cirrhosis , Colitis, Crohn s, Hepatitis A, Hepatitis B, Hepatitis C Endocrine Patient has history of Type II Diabetes Denies history of Type I Diabetes Genitourinary Denies history of End Stage Renal Disease Immunological Denies history of Lupus Erythematosus, Raynaud s, Scleroderma Integumentary (Skin) Demetrius, Elizar J. ([id number]) Denies history of History of Burn, History of pressure wounds Musculoskeletal Patient has history of Osteoarthritis Denies history of Gout, Rheumatoid Arthritis, Osteomyelitis Neurologic Denies history of Dementia, Neuropathy, Quadriplegia, Paraplegia, Seizure Disorder Oncologic Denies history of Received Chemotherapy, Received Radiation Medical And Surgical History Notes Cardiovascular High Cholesterol Review of Systems (ROS) Constitutional Symptoms (General Health) Denies complaints or symptoms of Fatigue, Fever, Chills, Marked Weight Change. Respiratory Denies complaints or symptoms of Chronic or frequent coughs, Shortness of Breath. Cardiovascular Denies complaints or symptoms of Chest pain, LE edema. Psychiatric Denies complaints or symptoms of Anxiety, Claustrophobia. Objective Constitutional Well-nourished and well-hydrated in no acute distress. Vitals Time Taken: 12:48 PM, Height: 74 in, Weight: 233 lbs, BMI: 29.9, Temperature: 98.0 F, Pulse: 86 bpm, Respiratory Rate: 16 breaths/min, Blood Pressure: 142/80 mmHg. Respiratory normal breathing without difficulty. Psychiatric this patient is able to make decisions and  demonstrates good insight into disease process. Alert and Oriented x 3. pleasant and cooperative. General Notes: Upon inspection patient's wound bed actually showed signs of some Slough as well as callous buildup the did require sharp debridement today. I was able to clean away the necrotic tissue without complication post debridement wound bed appears to be doing much better which is excellent news. Overall there's no signs of active infection at this time. Integumentary (Hair, Skin) Wound #4 status is Open. Original cause of wound was Pressure Injury. The wound is located on the [place]. The wound measures 0.2cm length x 0.2cm width x 0.2cm depth; 0.031cm^2 area and 0.006cm^3 volume. There is Fat Layer (Subcutaneous Tissue) Exposed exposed. There is no tunneling or undermining noted. There is a small amount of sanguinous drainage noted. The wound margin is indistinct and nonvisible. There is large (67-100%) red granulation within the wound bed. There is a small (1-33%) amount of necrotic tissue within the wound bed including Adherent Slough. SANEL, STEMMER ([id number]) Assessment Active Problems ICD-10 Type 2 diabetes  mellitus with foot ulcer Non-pressure chronic ulcer of other part of right foot with fat layer exposed Essential (primary) hypertension Chronic atrial fibrillation, unspecified Procedures Wound #4 Pre-procedure diagnosis of Wound #4 is a Diabetic Wound/Ulcer of the Lower Extremity located on the [place] .Severity of Tissue Pre Debridement is: Fat layer exposed. There was a Excisional Skin/Subcutaneous Tissue Debridement with a total area of 0.04 sq cm performed by STONE III, Salome Cozby E., PA-C. With the following instrument(s): Curette to remove Non-Viable tissue/material. Material removed includes Callus, Subcutaneous Tissue, and Slough after achieving pain control using Lidocaine. No specimens were taken. A time out was conducted at 13:09, prior to the  start of the procedure. There was no bleeding. The procedure was tolerated well with a pain level of 0 throughout and a pain level of 0 following the procedure. Post Debridement Measurements: 0.2cm length x 0.2cm width x 0.2cm depth; 0.006cm^3 volume. Character of Wound/Ulcer Post Debridement is improved. Severity of Tissue Post Debridement is: Fat layer exposed. Post procedure Diagnosis Wound #4: Same as Pre-Procedure Plan Wound Cleansing: Wound #4 Right,Plantar Foot: Clean wound with Normal Saline. Anesthetic (add to Medication List): Wound #4 Right,Plantar Foot: Topical Lidocaine 4% cream applied to wound bed prior to debridement (In Clinic Only). Primary Wound Dressing: Wound #4 Right,Plantar Foot: Silver Collagen Secondary Dressing: Wound #4 Right,Plantar Foot: [place] Dressing Change Frequency: Wound #4 Right,Plantar Foot: Change dressing every other day. Follow-up Appointments: Wound #4 Right,Plantar Foot: Return Appointment in 1 week. Edema Control: Wound #4 Right,Plantar Foot: Elevate legs to the level of the heart and pump ankles as often as possible Heckart, Jeanmarc J. ([id number]) Off-Loading: Wound #4 Right,Plantar Foot: Open toe surgical shoe with peg assist. - right Additional Orders / Instructions: Wound #4 Right,Plantar Foot: Increase protein intake. My suggestion at this point is gonna be that we go ahead and continue with the above wound care measures for the next week and the patient is in agreement with that plan. If anything changes or worsens meantime he will contact the office and let me know. Please see above for specific wound care orders. We will see patient for re-evaluation in 1 week(s) here in the clinic. If anything worsens or changes patient will contact our office for additional recommendations. I recommended for the patient that if he does not see a lot of improvement over the next week that he needs to strongly consider the total contact  cast. I completely understand the limitations with driving but again also do not want him to establish infection in his foot that will be a more significant issue for him. He understands. Will see were things stand at follow-up. Electronic Signature(s) Signed: 07/20/2018 12:14:37 AM By: Worthy Keeler PA-C Entered By: Worthy Keeler on 07/18/2018 13:21:22 THOR, NANNINI ([id number]) -------------------------------------------------------------------------------- ROS/PFSH Details Patient Name: VERNELL, TOWNLEY. Date of Service: 07/18/2018 12:45 PM Medical Record Number: [id number] Patient Account Number: [id number] Date of Birth/Sex: 1950/03/15 (68 y.o. M) Treating RN: Harold Barban Primary Care Provider: Benita Stabile Other Clinician: Referring Provider: Benita Stabile Treating Provider/Extender: STONE III, Gustie Bobb Weeks in Treatment: 3 Information Obtained From Patient Constitutional Symptoms (General Health) Complaints and Symptoms: Negative for: Fatigue; Fever; Chills; Marked Weight Change Respiratory Complaints and Symptoms: Negative for: Chronic or frequent coughs; Shortness of Breath Medical History: Negative for: Aspiration; Asthma; Chronic Obstructive Pulmonary Disease (COPD); Pneumothorax; Sleep Apnea; Tuberculosis Cardiovascular Complaints and Symptoms: Negative for: Chest pain; LE edema Medical History: Positive for: Arrhythmia - a fib; Hypertension Negative for: Angina;  Congestive Heart Failure; Coronary Artery Disease; Deep Vein Thrombosis; Hypotension; Myocardial Infarction; Peripheral Arterial Disease; Peripheral Venous Disease; Phlebitis; Vasculitis Past Medical History Notes: High Cholesterol Psychiatric Complaints and Symptoms: Negative for: Anxiety; Claustrophobia Eyes Medical History: Negative for: Cataracts; Glaucoma; Optic Neuritis Ear/Nose/Mouth/Throat Medical History: Negative for: Chronic sinus problems/congestion; Middle ear  problems Hematologic/Lymphatic Medical History: Negative for: Anemia; Hemophilia; Human Immunodeficiency Virus; Lymphedema; Sickle Cell Disease Gastrointestinal ORRIS, PERIN. ([id number]) Medical History: Negative for: Cirrhosis ; Colitis; Crohnos; Hepatitis A; Hepatitis B; Hepatitis C Endocrine Medical History: Positive for: Type II Diabetes Negative for: Type I Diabetes Time with diabetes: 3 years Treated with: Oral agents Blood sugar tested every day: No Genitourinary Medical History: Negative for: End Stage Renal Disease Immunological Medical History: Negative for: Lupus Erythematosus; Raynaudos; Scleroderma Integumentary (Skin) Medical History: Negative for: History of Burn; History of pressure wounds Musculoskeletal Medical History: Positive for: Osteoarthritis Negative for: Gout; Rheumatoid Arthritis; Osteomyelitis Neurologic Medical History: Negative for: Dementia; Neuropathy; Quadriplegia; Paraplegia; Seizure Disorder Oncologic Medical History: Negative for: Received Chemotherapy; Received Radiation Immunizations Pneumococcal Vaccine: Received Pneumococcal Vaccination: Yes Immunization Notes: up to date Implantable Devices No devices added Family and Social History Cancer: Yes - Siblings; Diabetes: Yes - Father; Heart Disease: Yes - Mother; Hereditary Spherocytosis: No; Hypertension: Yes - Mother; Kidney Disease: No; Lung Disease: No; Seizures: No; Stroke: No; Thyroid Problems: No; Tuberculosis: No; Former smoker; Marital Status - Single; Alcohol Use: Never; Drug Use: No History; Caffeine Use: Moderate; Financial Concerns: No; Food, Clothing or Shelter Needs: No; Support System Lacking: No; Transportation Concerns: No DOUGLAS, SMOLINSKY ([id number]) Physician Affirmation I have reviewed and agree with the above information. Electronic Signature(s) Signed: 07/18/2018 4:40:23 PM By: Harold Barban Signed: 07/20/2018 12:14:37 AM By: Worthy Keeler PA-C Entered  By: Worthy Keeler on 07/18/2018 13:20:21 Brandi, [name] ([id number]) -------------------------------------------------------------------------------- SuperBill Details Patient Name: JADIAN, KARMAN. Date of Service: 07/18/2018 Medical Record Number: [id number] Patient Account Number: [id number] Date of Birth/Sex: Oct 13, 1950 (68 y.o. M) Treating RN: Harold Barban Primary Care Provider: Benita Stabile Other Clinician: Referring Provider: Benita Stabile Treating Provider/Extender: Melburn Hake, Aldea Avis Weeks in Treatment: 3 Diagnosis Coding ICD-10 Codes Code Description E11.621 Type 2 diabetes mellitus with foot ulcer L97.512 Non-pressure chronic ulcer of other part of right foot with fat layer exposed I10 Essential (primary) hypertension I48.20 Chronic atrial fibrillation, unspecified Facility Procedures CPT4 Code: [id number] Description: 11042 - DEB SUBQ TISSUE 20 SQ CM/< ICD-10 Diagnosis Description L97.512 Non-pressure chronic ulcer of other part of right foot with fa Modifier: t layer exposed Quantity: 1 Physician Procedures CPT4 Code: [id number] Description: 11042 - WC PHYS SUBQ TISS 20 SQ CM ICD-10 Diagnosis Description L97.512 Non-pressure chronic ulcer of other part of right foot with fa Modifier: t layer exposed Quantity: 1 Electronic Signature(s) Signed: 07/20/2018 12:14:37 AM By: Worthy Keeler PA-C Entered By: Worthy Keeler on 07/18/2018 13:21:34

## 2018-07-25 ENCOUNTER — Ambulatory Visit: Payer: PPO | Admitting: Podiatry

## 2018-07-25 ENCOUNTER — Other Ambulatory Visit: Payer: Self-pay

## 2018-07-25 ENCOUNTER — Encounter: Payer: Self-pay | Admitting: Podiatry

## 2018-07-25 DIAGNOSIS — M79675 Pain in left toe(s): Secondary | ICD-10-CM

## 2018-07-25 DIAGNOSIS — M79674 Pain in right toe(s): Secondary | ICD-10-CM | POA: Diagnosis not present

## 2018-07-25 DIAGNOSIS — E119 Type 2 diabetes mellitus without complications: Secondary | ICD-10-CM | POA: Diagnosis not present

## 2018-07-25 DIAGNOSIS — B351 Tinea unguium: Secondary | ICD-10-CM

## 2018-07-25 DIAGNOSIS — E785 Hyperlipidemia, unspecified: Secondary | ICD-10-CM | POA: Diagnosis not present

## 2018-07-25 DIAGNOSIS — E039 Hypothyroidism, unspecified: Secondary | ICD-10-CM | POA: Diagnosis not present

## 2018-07-25 NOTE — Progress Notes (Signed)
This patient presents the office with chief complaint of painful  Nails both feet. feet.  He says the callus has broken down again and he has gone to the wound center for treatment.  He says the bandage was applied at the wound center and he is scheduled for an appointment tomorrow.  He presents the office today for preventative foot care services for painful long thick nails especially his big toenails both feet.  General Appearance  Alert, conversant and in no acute stress.  Vascular  Dorsalis pedis and posterior tibial  pulses are palpable  bilaterally.  Capillary return is within normal limits  bilaterally. Temperature is within normal limits  bilaterally.  Neurologic  Senn-Weinstein monofilament wire test diminished  bilaterally. Muscle power within normal limits bilaterally.  Nails Thick disfigured discolored nails with subungual debris  from hallux to fifth toes bilaterally. No evidence of bacterial infection or drainage bilaterally.  Orthopedic  No limitations of motion  feet .  No crepitus or effusions noted.  No bony pathology or digital deformities noted. Plantarflexed second metatarsal both feet. Hammer toe second right.  Skin  normotropic skin with no porokeratosis noted bilaterally.  No signs of infections or ulcers noted. Thickened disfigured discolored skin callus noted sub 2 right.  Onychomycosis  X 10.   Debridement of  Nails.  RTC 3 month for nail care.  Patient requests a surgical consult to help eliminate his callus/ulcer.  Patient to be scheduled with Dr.  Amalia Hailey.   Gardiner Barefoot DPM

## 2018-07-26 ENCOUNTER — Encounter: Payer: PPO | Attending: Physician Assistant | Admitting: Physician Assistant

## 2018-07-26 DIAGNOSIS — I482 Chronic atrial fibrillation, unspecified: Secondary | ICD-10-CM | POA: Diagnosis not present

## 2018-07-26 DIAGNOSIS — E11621 Type 2 diabetes mellitus with foot ulcer: Secondary | ICD-10-CM | POA: Insufficient documentation

## 2018-07-26 DIAGNOSIS — Z87891 Personal history of nicotine dependence: Secondary | ICD-10-CM | POA: Insufficient documentation

## 2018-07-26 DIAGNOSIS — I10 Essential (primary) hypertension: Secondary | ICD-10-CM | POA: Insufficient documentation

## 2018-07-26 DIAGNOSIS — L97512 Non-pressure chronic ulcer of other part of right foot with fat layer exposed: Secondary | ICD-10-CM | POA: Diagnosis not present

## 2018-07-27 NOTE — Progress Notes (Signed)
DELYLE, WEIDER ([id number]) Visit Report for 07/26/2018 Arrival Information Details Patient Name: [name], [name]. Date of Service: 07/26/2018 9:00 AM Medical Record Number: [id number] Patient Account Number: [id number] Date of Birth/Sex: 11-07-1950 (68 y.o. M) Treating RN: Army Melia Primary Care Kristoff Coonradt: Benita Stabile Other Clinician: Referring Keeva Reisen: Benita Stabile Treating Yoshi Mancillas/Extender: Melburn Hake, HOYT Weeks in Treatment: 5 Visit Information History Since Last Visit Added or deleted any medications: No Patient Arrived: Ambulatory Any new allergies or adverse reactions: No Arrival Time: 08:56 Had a fall or experienced change in No Accompanied By: self activities of daily living that may affect Transfer Assistance: None risk of falls: Signs or symptoms of abuse/neglect since last visito No Hospitalized since last visit: No Has Dressing in Place as Prescribed: Yes Pain Present Now: No Electronic Signature(s) Signed: 07/26/2018 10:47:17 AM By: Army Melia Entered By: Army Melia on 07/26/2018 08:57:10 Canan, [name] ([id number]) -------------------------------------------------------------------------------- Encounter Discharge Information Details Patient Name: [name]. Date of Service: 07/26/2018 9:00 AM Medical Record Number: [id number] Patient Account Number: [id number] Date of Birth/Sex: 06-24-1950 (68 y.o. M) Treating RN: Montey Hora Primary Care Waleed Dettman: Benita Stabile Other Clinician: Referring Anaaya Fuster: TATE, Sharlet Salina Treating Minka Knight/Extender: Melburn Hake, HOYT Weeks in Treatment: 5 Encounter Discharge Information Items Post Procedure Vitals Discharge Condition: Stable Temperature (F): 97.8 Ambulatory Status: Ambulatory Pulse (bpm): 75 Discharge Destination: Home Respiratory Rate (breaths/min): 16 Transportation: Private Auto Blood Pressure (mmHg): 143/79 Accompanied By: self Schedule Follow-up Appointment: Yes Clinical Summary of Care: Electronic  Signature(s) Signed: 07/26/2018 5:36:25 PM By: Montey Hora Entered By: Montey Hora on 07/26/2018 09:15:14 Jessop, [name] ([id number]) -------------------------------------------------------------------------------- Lower Extremity Assessment Details Patient Name: [name]. Date of Service: 07/26/2018 9:00 AM Medical Record Number: [id number] Patient Account Number: [id number] Date of Birth/Sex: [birth year] (68 y.o. M) Treating RN: Army Melia Primary Care Triniti Gruetzmacher: Benita Stabile Other Clinician: Referring Jezelle Gullick: Benita Stabile Treating Terik Haughey/Extender: STONE III, HOYT Weeks in Treatment: 5 Edema Assessment Assessed: [Left: No] [Right: No] Edema: [Left: N] [Right: o] Vascular Assessment Pulses: Dorsalis Pedis Palpable: [Right:Yes] Electronic Signature(s) Signed: 07/26/2018 10:47:17 AM By: Army Melia Entered By: Army Melia on 07/26/2018 09:02:05 Wiebelhaus, [name] ([id number]) -------------------------------------------------------------------------------- Multi Wound Chart Details Patient Name: [name]. Date of Service: 07/26/2018 9:00 AM Medical Record Number: [id number] Patient Account Number: [id number] Date of Birth/Sex: [birth year] (68 y.o. M) Treating RN: Montey Hora Primary Care Jaquelyne Firkus: Benita Stabile Other Clinician: Referring Sakura Denis: TATE, Sharlet Salina Treating Terra Aveni/Extender: STONE III, HOYT Weeks in Treatment: 5 Vital Signs Height(in): 74 Pulse(bpm): 79 Weight(lbs): 233 Blood Pressure(mmHg): 143/75 Body Mass Index(BMI): 30 Temperature(F): 97.8 Respiratory Rate 16 (breaths/min): Photos: [N/A:N/A] Wound Location: Right Foot - Plantar N/A N/A Wounding Event: Pressure Injury N/A N/A Primary Etiology: Diabetic Wound/Ulcer of the N/A N/A Lower Extremity Secondary Etiology: Pressure Ulcer N/A N/A Comorbid History: Arrhythmia, Hypertension, N/A N/A Type II Diabetes, Osteoarthritis Date Acquired: 06/13/2018 N/A N/A Weeks of Treatment: 5 N/A  N/A Wound Status: Open N/A N/A Measurements L x W x D 0.2x0.2x0.2 N/A N/A (cm) Area (cm) : 0.031 N/A N/A Volume (cm) : 0.006 N/A N/A % Reduction in Area: 99.30% N/A N/A % Reduction in Volume: 99.90% N/A N/A Classification: Grade 2 N/A N/A Exudate Amount: Small N/A N/A Exudate Type: Sanguinous N/A N/A Exudate Color: red N/A N/A Wound Margin: Indistinct, nonvisible N/A N/A Granulation Amount: None Present (0%) N/A N/A Necrotic Amount: Large (67-100%) N/A N/A Necrotic Tissue: Eschar, Adherent Slough N/A N/A Exposed Structures: Fat Layer (Subcutaneous N/A N/A Tissue) Exposed: Yes Fascia: No Tendon: No  [name], [name] ([id number]) Muscle: No Joint: No Bone: No Epithelialization: Small (1-33%) N/A N/A Treatment Notes Electronic Signature(s) Signed: 07/26/2018 5:36:25 PM By: Montey Hora Entered By: Montey Hora on 07/26/2018 09:07:15 Teegarden, [name] ([id number]) -------------------------------------------------------------------------------- Multi-Disciplinary Care Plan Details Patient Name: [name], [name]. Date of Service: 07/26/2018 9:00 AM Medical Record Number: [id number] Patient Account Number: [id number] Date of Birth/Sex: 11/07/1950 (68 y.o. M) Treating RN: Montey Hora Primary Care Gerilyn Stargell: Benita Stabile Other Clinician: Referring Montray Kliebert: Benita Stabile Treating Earnestene Angello/Extender: Melburn Hake, HOYT Weeks in Treatment: 5 Active Inactive Necrotic Tissue Nursing Diagnoses: Impaired tissue integrity related to necrotic/devitalized tissue Goals: Necrotic/devitalized tissue will be minimized in the wound bed Date Initiated: 06/21/2018 Target Resolution Date: 06/21/2018 Goal Status: Active Patient/caregiver will verbalize understanding of reason and process for debridement of necrotic tissue Date Initiated: 06/21/2018 Target Resolution Date: 06/21/2018 Goal Status: Active Interventions: Assess patient pain level pre-, during and post procedure and prior to  discharge Treatment Activities: Apply topical anesthetic as ordered : 06/21/2018 Notes: Orientation to the Wound Care Program Nursing Diagnoses: Knowledge deficit related to the wound healing center program Goals: Patient/caregiver will verbalize understanding of the [facility] Date Initiated: 06/21/2018 Target Resolution Date: 06/21/2018 Goal Status: Active Interventions: Provide education on orientation to the wound center Notes: Pressure Nursing Diagnoses: Knowledge deficit related to management of pressures ulcers Goals: CORAN, DIPAOLA ([id number]) Patient will remain free from development of additional pressure ulcers Date Initiated: 06/21/2018 Target Resolution Date: 06/29/2018 Goal Status: Active Interventions: Assess: immobility, friction, shearing, incontinence upon admission and as needed Provide education on pressure ulcers Notes: Wound/Skin Impairment Nursing Diagnoses: Impaired tissue integrity Goals: Ulcer/skin breakdown will have a volume reduction of 30% by week 4 Date Initiated: 06/21/2018 Target Resolution Date: 07/21/2018 Goal Status: Active Interventions: Assess patient/caregiver ability to perform ulcer/skin care regimen upon admission and as needed Assess ulceration(s) every visit Treatment Activities: Referred to DME Meshia Rau for dressing supplies : 06/21/2018 Skin care regimen initiated : 06/21/2018 Topical wound management initiated : 06/21/2018 Notes: Electronic Signature(s) Signed: 07/26/2018 5:36:25 PM By: Montey Hora Entered By: Montey Hora on 07/26/2018 09:07:04 Frick, [name] ([id number]) -------------------------------------------------------------------------------- Pain Assessment Details Patient Name: [name]. Date of Service: 07/26/2018 9:00 AM Medical Record Number: [id number] Patient Account Number: [id number] Date of Birth/Sex: [birth year] (68 y.o. M) Treating RN: Army Melia Primary Care Nivan Melendrez: Benita Stabile  Other Clinician: Referring Stephie Xu: Benita Stabile Treating Kriss Ishler/Extender: Melburn Hake, HOYT Weeks in Treatment: 5 Active Problems Location of Pain Severity and Description of Pain Patient Has Paino Patient Unable to Respond Site Locations Pain Management and Medication Current Pain Management: Electronic Signature(s) Signed: 07/26/2018 10:47:17 AM By: Army Melia Entered By: Army Melia on 07/26/2018 08:57:16 Burruss, [name] ([id number]) -------------------------------------------------------------------------------- Patient/Caregiver Education Details Patient Name: [name] Date of Service: 07/26/2018 9:00 AM Medical Record Number: [id number] Patient Account Number: [id number] Date of Birth/Gender: 05-28-1950 (68 y.o. M) Treating RN: Montey Hora Primary Care Physician: Benita Stabile Other Clinician: Referring Physician: Benita Stabile Treating Physician/Extender: Sharalyn Ink in Treatment: 5 Education Assessment Education Provided To: Patient Education Topics Provided Wound/Skin Impairment: Handouts: Other: wound care as ordered Methods: Demonstration, Explain/Verbal Responses: State content correctly Electronic Signature(s) Signed: 07/26/2018 5:36:25 PM By: Montey Hora Entered By: Montey Hora on 07/26/2018 09:13:53 Tatsch, [name] ([id number]) -------------------------------------------------------------------------------- Wound Assessment Details Patient Name: [name]. Date of Service: 07/26/2018 9:00 AM Medical Record Number: [id number] Patient Account Number: [id number] Date of Birth/Sex: [birth year] (68 y.o. M) Treating RN: Army Melia Primary  Care Marlyss Cissell: TATE, Sharlet Salina Other Clinician: Referring Gola Bribiesca: Benita Stabile Treating Kaedyn Polivka/Extender: STONE III, HOYT Weeks in Treatment: 5 Wound Status Wound Number: 4 Primary Etiology: Diabetic Wound/Ulcer of the Lower Extremity Wound Location: Right Foot - Plantar Secondary Pressure  Ulcer Wounding Event: Pressure Injury Etiology: Date Acquired: 06/13/2018 Wound Status: Open Weeks Of Treatment: 5 Comorbid Arrhythmia, Hypertension, Type II Diabetes, Clustered Wound: No History: Osteoarthritis Photos Wound Measurements Length: (cm) 0.2 Width: (cm) 0.2 Depth: (cm) 0.2 Area: (cm) 0.031 Volume: (cm) 0.006 % Reduction in Area: 99.3% % Reduction in Volume: 99.9% Epithelialization: Small (1-33%) Tunneling: No Undermining: No Wound Description Classification: Grade 2 Foul Odor A Wound Margin: Indistinct, nonvisible Slough/Fibr Exudate Amount: Small Exudate Type: Sanguinous Exudate Color: red fter Cleansing: No ino No Wound Bed Granulation Amount: None Present (0%) Exposed Structure Necrotic Amount: Large (67-100%) Fascia Exposed: No Necrotic Quality: Eschar, Adherent Slough Fat Layer (Subcutaneous Tissue) Exposed: Yes Tendon Exposed: No Muscle Exposed: No Joint Exposed: No Bone Exposed: No Treatment Notes Ogden, Konner J. ([id number]) Wound #4 (Right, Plantar Foot) Notes silvercel, foam and telfa island dressing Electronic Signature(s) Signed: 07/26/2018 10:47:17 AM By: Army Melia Entered By: Army Melia on 07/26/2018 09:01:43 Lecompte, [name] ([id number]) -------------------------------------------------------------------------------- Vitals Details Patient Name: [name]. Date of Service: 07/26/2018 9:00 AM Medical Record Number: [id number] Patient Account Number: [id number] Date of Birth/Sex: 1950/12/20 (68 y.o. M) Treating RN: Army Melia Primary Care Madysen Faircloth: Benita Stabile Other Clinician: Referring Mirielle Byrum: Benita Stabile Treating Shevelle Smither/Extender: STONE III, HOYT Weeks in Treatment: 5 Vital Signs Time Taken: 08:57 Temperature (F): 97.8 Height (in): 74 Pulse (bpm): 79 Weight (lbs): 233 Respiratory Rate (breaths/min): 16 Body Mass Index (BMI): 29.9 Blood Pressure (mmHg): 143/75 Reference Range: 80 - 120 mg / dl Electronic  Signature(s) Signed: 07/26/2018 10:47:17 AM By: Army Melia Entered By: Army Melia on 07/26/2018 08:59:06

## 2018-07-27 NOTE — Progress Notes (Signed)
TARRENCE, ENCK ([id number]) Visit Report for 07/26/2018 Chief Complaint Document Details Patient Name: [name], [name]. Date of Service: 07/26/2018 9:00 AM Medical Record Number: [id number] Patient Account Number: [id number] Date of Birth/Sex: 03-Apr-1950 (68 y.o. M) Treating RN: Montey Hora Primary Care Provider: Benita Stabile Other Clinician: Referring Provider: Benita Stabile Treating Provider/Extender: Melburn Hake, Reylene Stauder Weeks in Treatment: 5 Information Obtained from: Patient Chief Complaint Right plantar foot ulcer Electronic Signature(s) Signed: 07/27/2018 1:30:00 AM By: Worthy Keeler PA-C Entered By: Worthy Keeler on 07/26/2018 09:02:21 Maglione, [name] ([id number]) -------------------------------------------------------------------------------- Debridement Details Patient Name: [name]. Date of Service: 07/26/2018 9:00 AM Medical Record Number: [id number] Patient Account Number: [id number] Date of Birth/Sex: 1950/07/25 (68 y.o. M) Treating RN: Montey Hora Primary Care Provider: Benita Stabile Other Clinician: Referring Provider: Benita Stabile Treating Provider/Extender: STONE III, Rosey Eide Weeks in Treatment: 5 Debridement Performed for Wound #4 Right,Plantar Foot Assessment: Performed By: Physician STONE III, Melecio Cueto E., PA-C Debridement Type: Debridement Severity of Tissue Pre Fat layer exposed Debridement: Level of Consciousness (Pre- Awake and Alert procedure): Pre-procedure Verification/Time Yes - 09:07 Out Taken: Start Time: 09:07 Pain Control: Lidocaine 4% Topical Solution Total Area Debrided (L x W): 0.2 (cm) x 0.2 (cm) = 0.04 (cm) Tissue and other material Viable, Non-Viable, Callus, Slough, Subcutaneous, Slough debrided: Level: Skin/Subcutaneous Tissue Debridement Description: Excisional Instrument: Curette Bleeding: Minimum Hemostasis Achieved: Pressure End Time: 09:11 Procedural Pain: 0 Post Procedural Pain: 0 Response to Treatment: Procedure was  tolerated well Level of Consciousness Awake and Alert (Post-procedure): Post Debridement Measurements of Total Wound Length: (cm) 0.3 Width: (cm) 0.5 Depth: (cm) 0.2 Volume: (cm) 0.024 Character of Wound/Ulcer Post Debridement: Improved Severity of Tissue Post Debridement: Fat layer exposed Post Procedure Diagnosis Same as Pre-procedure Electronic Signature(s) Signed: 07/26/2018 5:36:25 PM By: Montey Hora Signed: 07/27/2018 1:30:00 AM By: Worthy Keeler PA-C Entered By: Montey Hora on 07/26/2018 09:12:28 Lapier, [name] ([id number]) -------------------------------------------------------------------------------- HPI Details Patient Name: [name]. Date of Service: 07/26/2018 9:00 AM Medical Record Number: [id number] Patient Account Number: [id number] Date of Birth/Sex: January 17, 1951 (68 y.o. M) Treating RN: Montey Hora Primary Care Provider: Benita Stabile Other Clinician: Referring Provider: Benita Stabile Treating Provider/Extender: STONE III, Jasnoor Trussell Weeks in Treatment: 5 History of Present Illness Associated Signs and Symptoms: Patient has a history of diabetes mellitus type II, hypertension, and atrial fibrillation. He also has a hammertoe deformity of the bilateral feet which seems to be causing pressure in the ball of his foot. HPI Description: 03/09/17 on evaluation today patient presents for his initial evaluation concerning an ulcer on the plantar aspect of his right foot which has been open he tells me for about three weeks. He has been seen at [facility] where they did perform debridement it appears according to a note on 02/22/17 where unfortunately it appears that his callous area began to break down into an ulcer after modifications have been added to his insulin. This had happened previously as well. However I do not have details of the severity of debridement at that point although it does not sound as if you the debridement was too significant based on what  the patient is telling me that he still had a lot of callous following debridement. With that being said they were going to look into altering his insoles to try to prevent further breakdown in the future. Nonetheless he has had foul odor discharge coming from the ulcer which is noted all the way back to that visit on 02/22/17  [facility]. Patient states that this was concerning him more than anything else. He does have diabetes although he described this as "borderline diabetes" he is on metoprolol however along with lisinopril. Patient is not having any issues with pain in regard to his right plantar foot. 03/16/17 on evaluation today patient appears to be doing much better in regard to his plantar foot ulcer. He actually tells me that he loves the peg assist offloading shoe and that he hasn't had any pain in the ulcer area from the callous since I worked on it last week. Overall he is extremely happy with how things have progressed. Likewise the wound bed has no slough noted there is no evidence of infection and it looks excellent. I did receive the results of his hemoglobin A1c which showed a value of 5.9 which was elevated but actually rather well. Subsequently I did also receive the x-ray of his foot which showed diffuse degenerative change but no underlying acute bony abnormality. 03/23/17 on evaluation today patient appears to be doing better in regard to his right plantar foot ulcer. He continues to show signs of improvement there's definitely not as much drainage at this point. He has been tolerating the dressing changes without complication he is not happy with the peg assist offloading shoe but at the same time I do believe that it is making good progress as far as offloading is concerned. I still believe he may need to talk to a surgeon about surgically correcting a hammertoe in order to avoid additional pressure to the site especially since he Artie has diabetic shoes and they do  not seem to have prevented callous buildup in ulcer formation. 03/30/17 on evaluation today patient appears to be doing better in regard to his right plantar foot ulcer. Continues to show signs of improvement which is good news. Unfortunately we were unable to get the appointment with the orthopedic, Dr. Doran Durand, whom I recommended for him due to patient having a balance at the practice. With that being said patient's foot ulcer does seem to be doing much better on evaluation today he still has some depth to the wound but overall we are seeing improvement and epithelialization week by week. Hopefully this is something that will close shortly. 04/05/17 on evaluation today patient unfortunately has what appears to be an area of the plantar surface of his ulcer where he had fluid collection where the callous grew over top of the wound bed. Unfortunately there was some pus like material noted during cleansing that we did sent for culture today. Hopefully this is not truly an infection is or does not appear to be any evidence of erythema surrounding and maybe this is just simply a small setback with a fluid collection that has caused this issue. Nonetheless we will see what that shows when we get the culture back. I am also going to have it completely antibiotic which I previously placed him on anyway which will hopefully prevent any true infection from setting in. 04/12/17 on evaluation today patient's ulcer on the plantar aspect of his right foot actually appears to be better then during last weeks evaluation. In general he has been tolerating the dressing changes in utilizing the offloading shoe which does seem to be beneficial for him. With that being said he still seems to get some pressure to the area just not nearly as much as he previously had noted. Again fortunately there is no significant pain. KEEFER, SOULLIERE ([id number]) 04/19/17 on  evaluation today patient appears to be doing excellent in regard  to his right foot ulcer. He has been tolerating the dressing changes without complication. There really has not been any drainage over the past few days he tells me. With that being said he seems to be doing excellent in my opinion at this point he does have some callous buildup. 04/26/17 On evaluation today patient's wound appears to be completely healed which is great news. He has been tolerating the dressing changes without complication and I do think he has done very well in regard to the healing process and offloading he has listed everything that that we instructed him to do. Obviously this appears to have paid off and he has progressed very nicely. Readmission: 07/05/17 on evaluation today patient is seen for fault evaluation and our clinic concerning the same issue that I have previously treated him for ending back in April 2019. This is a callous region with subsequently an ulcer underneath this on the plantar aspect of his right second metatarsal region. Fortunately he's not having any significant discomfort although his girlfriend told him that she noted an area of bruising at the site underneath the callous and wanted him to come get this checked out. He was very please with our care previous and therefore was more than happy to come let us check this for him. Upon inspection initially he did have significant callous overlying the area in question. It was not easily identifiable as far as any ulcer was concerned. With that being said he with this significant callous did require some sharp debridement and following debridement there did appear to be a small ulcer underlying. Fortunately patient in general shows no signs of infection at this point. He again has neuropathy and therefore does not have any ongoing discomfort or pain at this point. 07/12/17 on evaluation today patient actually appears to be doing very well in regard to his plantar foot ulcer. In fact this appears to be completely  healed and there's no residual opening at this time. Overall I'm very pleased in this regard. Patient also is extremely pleased. Readmission: 02/08/18 patient presents today for follow-up evaluation he has previously been seen here in our office for the same issue. Fortunately there is not been any evidence of infection since I last saw him he does note that he is been going to see his local podiatrist for callous pairing. During that time they actually noted that he had an open wound and subsequently referred him back ties for evaluation of this wound. He has done very well in the past with good wound care often healing in just a few weeks. No fevers, chills, nausea, or vomiting noted at this time. 02/15/18 on evaluation today patient actually appears to be doing very well in regard to his plantar foot ulcer. He is been tolerating the dressing changes without complication. There does appear to be some eschar of the line the wound surface. Fortunately I am happy with how things are progressing from the standpoint of the wound progress from last week to this week. 02/22/18 on evaluation today patient appears to be doing very well in regard to his plantar foot ulcer. He did have callous buildup around the area that required some sharp debridement today. He tolerated the debridement without complication. Post debridement the wound bed appears to be doing much better there's just a very tiny opening still remaining. 03/01/18 on evaluation today patient appears to be doing excellent in regard to his plantar  foot ulcer. In fact after I debrided away what appeared to be a dried blood clot on the surface and actually appear there was just a very small opening remaining in the central portion of the wound. This is literally a pinpoint region. Overall he has done well and I'm very pleased in this regard. I think that likely one more week will see this area completely healed. 03/08/18 on evaluation today patient  appears to actually be doing very well in regard to his plantar foot ulcer. He has been tolerating the dressing changes without complication. Fortunately this appears to be completely healed at this point which is excellent news. Overall I'm extremely happy with how things have progressed. 06/21/18 on evaluation today patient presents for evaluation in our clinic concerning issues that he's having with you seeing the area of his right foot that is previously experienced issues with. Fortunately there does not appear to be any evidence of infection at this time which is good news. With that being said he tells me that he has noted the callous buildup of the past several months but it was only in the past roughly week that he has noted drainage from the callous. He's not having any significant discomfort other than the fact that he states walking on the area with the extensive callous seems to be an issue. 06/28/18 on evaluation today patient actually appears to be doing very well in regard to his plantar foot ulcer. He's been tolerating the dressing changes without complication. There is some callous buildup though not nearly as much as noted last QUADRY, KAMPA. ([id number]) week. Subsequently there is some need for sharp debridement today. 07/05/18 on evaluation today patient appears to be doing much better in regard to his plantar foot ulcer. This is not completely healed yet but it is thinking this morning and appears better than last week's evaluation. He has been tolerating the dressing changes without complication. 07/12/18 on evaluation today patient's foot ulcer actually appears to be showing some progress but again is not completely healed at this point. He is gonna require some sharp debridement today unfortunately. 07/18/18 on evaluation today patient appears to be doing about the same in regard to the plantar foot wound. Fortunately there's no signs of active infection. Unfortunately he hasn't  made quite as much progress as I'd like to have seen. I do believe that he is doing better than obviously when he started but we still have some work to do. I think a total contact cast may be something to consider if he does not make a lot of significant improvement over the next week. With that being said the one issue is gonna be that he is done with a wound on his right foot which is going to cause some issues with driving if he has to have a cast the need to have somebody drive him. He's not sure that even has this capability. 07/26/18 on evaluation today patient appears to be doing better in regard to his plantar foot ulcer. He does have a little bit of a blood blister I believe that collagen is actually drying out and then getting stuck to the wound causing [place] to collect fluid underneath ugly this may be counterproductive. He states that he felt like he was actually doing somewhat better when he was utilizing the alginate as opposed to the collagen. Electronic Signature(s) Signed: 07/27/2018 1:30:00 AM By: Worthy Keeler PA-C Entered By: Worthy Keeler on 07/26/2018 13:07:31 Finch, Dixon J. (  [id number]) -------------------------------------------------------------------------------- Physical Exam Details Patient Name: [name], [name]. Date of Service: 07/26/2018 9:00 AM Medical Record Number: [id number] Patient Account Number: [id number] Date of Birth/Sex: 01/16/1951 (68 y.o. M) Treating RN: Montey Hora Primary Care Provider: Benita Stabile Other Clinician: Referring Provider: TATE, Sharlet Salina Treating Provider/Extender: STONE III, Billy Turvey Weeks in Treatment: 5 Constitutional Well-nourished and well-hydrated in no acute distress. Respiratory normal breathing without difficulty. clear to auscultation bilaterally. Cardiovascular regular rate and rhythm with normal S1, S2. Psychiatric this patient is able to make decisions and demonstrates good insight into disease process. Alert and Oriented x  3. pleasant and cooperative. Notes In regard to the patient's plantar wound I did have to perform some sharp debridement clean away callous, Slough, and a little bit of a hematoma underneath the surface of where the skin had somewhat grown over a still open wound. Nonetheless I explained to the patient this is not completely healed although it is much closer than last time it does appear to be doing better. Nonetheless I do feel like that he needs to potentially have a little bit more offloading we may put a phone underneath the foot in order to help offload. Electronic Signature(s) Signed: 07/27/2018 1:30:00 AM By: Worthy Keeler PA-C Entered By: Worthy Keeler on 07/26/2018 13:08:25 Kehoe, [name] ([id number]) -------------------------------------------------------------------------------- Physician Orders Details Patient Name: LEBRON, NAUERT. Date of Service: 07/26/2018 9:00 AM Medical Record Number: [id number] Patient Account Number: [id number] Date of Birth/Sex: Jan 17, 1951 (68 y.o. M) Treating RN: Montey Hora Primary Care Provider: Benita Stabile Other Clinician: Referring Provider: Benita Stabile Treating Provider/Extender: Melburn Hake, Bridget Westbrooks Weeks in Treatment: 5 Verbal / Phone Orders: No Diagnosis Coding ICD-10 Coding Code Description E11.621 Type 2 diabetes mellitus with foot ulcer L97.512 Non-pressure chronic ulcer of other part of right foot with fat layer exposed I10 Essential (primary) hypertension I48.20 Chronic atrial fibrillation, unspecified Wound Cleansing Wound #4 Right,Plantar Foot o Clean wound with Normal Saline. Anesthetic (add to Medication List) Wound #4 Right,Plantar Foot o Topical Lidocaine 4% cream applied to wound bed prior to debridement (In Clinic Only). Primary Wound Dressing Wound #4 Right,Plantar Foot o Silver Alginate Secondary Dressing Wound #4 [place] Dressing Change Frequency Wound #4 Right,Plantar  Foot o Change dressing every other day. Follow-up Appointments Wound #4 Right,Plantar Foot o Return Appointment in 1 week. Edema Control Wound #4 Right,Plantar Foot o Elevate legs to the level of the heart and pump ankles as often as possible Off-Loading Wound #4 Right,Plantar Foot o Open toe surgical shoe with peg assist. - right Clontz, Jenesis J. ([id number]) Additional Orders / Instructions Wound #4 Right,Plantar Foot o Increase protein intake. Electronic Signature(s) Signed: 07/26/2018 5:36:25 PM By: Montey Hora Signed: 07/27/2018 1:30:00 AM By: Worthy Keeler PA-C Entered By: Montey Hora on 07/26/2018 09:13:27 Chauvin, [name] ([id number]) -------------------------------------------------------------------------------- Problem List Details Patient Name: [name], [name]. Date of Service: 07/26/2018 9:00 AM Medical Record Number: [id number] Patient Account Number: [id number] Date of Birth/Sex: Mar 04, 1950 (68 y.o. 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 Date of Service: 07/26/2018 9:00 AM Medical Record Number: [id number] Patient  Account Number: [id number] Date of Birth/Sex: 1950-02-12 (68 y.o. M) Treating RN: Montey Hora Primary Care Provider: Benita Stabile Other Clinician: Referring Provider: Benita Stabile Treating Provider/Extender: Melburn Hake, Deandra Goering Weeks in Treatment: 5 Subjective Chief Complaint Information obtained from Patient Right plantar foot ulcer History of Present Illness (HPI) The following HPI elements were documented for the patient's wound: Associated Signs and Symptoms: Patient has a history of diabetes mellitus type II, hypertension, and atrial fibrillation. He also has a hammertoe deformity of the bilateral feet which seems to be causing pressure in the ball of his foot. 03/09/17 on evaluation today patient presents for his initial evaluation concerning an ulcer on the plantar aspect of his right foot which has been open he tells me for about three weeks. He has been seen at [facility] where they did perform debridement it appears according to a note on 02/22/17 where unfortunately it appears that his callous area began to break down into an ulcer after modifications have been added to his insulin. This had happened previously as well. However I do not have details of the severity of debridement at that point although it does not sound as if you the debridement was too significant based on what the patient is telling me that he still had a lot of callous following debridement. With that being said they were going to look into altering his insoles to try to prevent further breakdown in the future. Nonetheless he has had foul odor discharge coming from the ulcer which is noted all the way back to that visit [facility]. Patient states that this was concerning him more than anything else. He does have diabetes although he described this as "borderline diabetes" he is on metoprolol however along with lisinopril. Patient is not having any issues with pain in regard to his  right plantar foot. 03/16/17 on evaluation today patient appears to be doing much better in regard to his plantar foot ulcer. He actually tells me that he loves the peg assist offloading shoe and that he hasn't had any pain in the ulcer area from the callous since I worked on it last week. Overall he is extremely happy with how things have progressed. Likewise the wound bed has no slough noted there is no evidence of infection and it looks excellent. I did receive the results of his hemoglobin A1c which showed a value of 5.9 which was elevated but actually rather well. Subsequently I did also receive the x-ray of his foot which showed diffuse degenerative change but no underlying acute bony abnormality. 03/23/17 on evaluation today patient appears to be doing better in regard to his right plantar foot ulcer. He continues to show signs of improvement there's definitely not as much drainage at this point. He has been tolerating the dressing changes without complication he is not happy with the peg assist offloading shoe but at the same time I do believe that it is making good progress as far as offloading is concerned. I still believe he may need to talk to a surgeon about surgically correcting a hammertoe in order to avoid additional pressure to the site especially since he Artie has diabetic shoes and they do not seem to have prevented callous buildup  in ulcer formation. 03/30/17 on evaluation today patient appears to be doing better in regard to his right plantar foot ulcer. Continues to show signs of improvement which is good news. Unfortunately we were unable to get the appointment with the orthopedic, Dr. Doran Durand, whom I recommended for him due to patient having a balance at the practice. With that being said patient's foot ulcer does seem to be doing much better on evaluation today he still has some depth to the wound but overall we are seeing improvement and epithelialization week by week.  Hopefully this is something that will close shortly. 04/05/17 on evaluation today patient unfortunately has what appears to be an area of the plantar surface of his ulcer where he had fluid collection where the callous grew over top of the wound bed. Unfortunately there was some pus like material noted during cleansing that we did sent for culture today. Hopefully this is not truly an infection is or does not appear to be any MARQUON, ALCALA. ([id number]) evidence of erythema surrounding and maybe this is just simply a small setback with a fluid collection that has caused this issue. Nonetheless we will see what that shows when we get the culture back. I am also going to have it completely antibiotic which I previously placed him on anyway which will hopefully prevent any true infection from setting in. 04/12/17 on evaluation today patient's ulcer on the plantar aspect of his right foot actually appears to be better then during last weeks evaluation. In general he has been tolerating the dressing changes in utilizing the offloading shoe which does seem to be beneficial for him. With that being said he still seems to get some pressure to the area just not nearly as much as he previously had noted. Again fortunately there is no significant pain. 04/19/17 on evaluation today patient appears to be doing excellent in regard to his right foot ulcer. He has been tolerating the dressing changes without complication. There really has not been any drainage over the past few days he tells me. With that being said he seems to be doing excellent in my opinion at this point he does have some callous buildup. 04/26/17 On evaluation today patient's wound appears to be completely healed which is great news. He has been tolerating the dressing changes without complication and I do think he has done very well in regard to the healing process and offloading he has listed everything that that we instructed him to do. Obviously  this appears to have paid off and he has progressed very nicely. Readmission: 07/05/17 on evaluation today patient is seen for fault evaluation and our clinic concerning the same issue that I have previously treated him for ending back in April 2019. This is a callous region with subsequently an ulcer underneath this on the plantar aspect of his right second metatarsal region. Fortunately he's not having any significant discomfort although his girlfriend told him that she noted an area of bruising at the site underneath the callous and wanted him to come get this checked out. He was very please with our care previous and therefore was more than happy to come let us check this for him. Upon inspection initially he did have significant callous overlying the area in question. It was not easily identifiable as far as any ulcer was concerned. With that being said he with this significant callous did require some sharp debridement and following debridement there did appear to be a small ulcer underlying. Fortunately  patient in general shows no signs of infection at this point. He again has neuropathy and therefore does not have any ongoing discomfort or pain at this point. 07/12/17 on evaluation today patient actually appears to be doing very well in regard to his plantar foot ulcer. In fact this appears to be completely healed and there's no residual opening at this time. Overall I'm very pleased in this regard. Patient also is extremely pleased. Readmission: 02/08/18 patient presents today for follow-up evaluation he has previously been seen here in our office for the same issue. Fortunately there is not been any evidence of infection since I last saw him he does note that he is been going to see his local podiatrist for callous pairing. During that time they actually noted that he had an open wound and subsequently referred him back ties for evaluation of this wound. He has done very well in the past  with good wound care often healing in just a few weeks. No fevers, chills, nausea, or vomiting noted at this time. 02/15/18 on evaluation today patient actually appears to be doing very well in regard to his plantar foot ulcer. He is been tolerating the dressing changes without complication. There does appear to be some eschar of the line the wound surface. Fortunately I am happy with how things are progressing from the standpoint of the wound progress from last week to this week. 02/22/18 on evaluation today patient appears to be doing very well in regard to his plantar foot ulcer. He did have callous buildup around the area that required some sharp debridement today. He tolerated the debridement without complication. Post debridement the wound bed appears to be doing much better there's just a very tiny opening still remaining. 03/01/18 on evaluation today patient appears to be doing excellent in regard to his plantar foot ulcer. In fact after I debrided away what appeared to be a dried blood clot on the surface and actually appear there was just a very small opening remaining in the central portion of the wound. This is literally a pinpoint region. Overall he has done well and I'm very pleased in this regard. I think that likely one more week will see this area completely healed. 03/08/18 on evaluation today patient appears to actually be doing very well in regard to his plantar foot ulcer. He has been tolerating the dressing changes without complication. Fortunately this appears to be completely healed at this point which is excellent news. Overall I'm extremely happy with how things have progressed. EVERET, FLAGG ([id number]) 06/21/18 on evaluation today patient presents for evaluation in our clinic concerning issues that he's having with you seeing the area of his right foot that is previously experienced issues with. Fortunately there does not appear to be any evidence of infection at this time  which is good news. With that being said he tells me that he has noted the callous buildup of the past several months but it was only in the past roughly week that he has noted drainage from the callous. He's not having any significant discomfort other than the fact that he states walking on the area with the extensive callous seems to be an issue. 06/28/18 on evaluation today patient actually appears to be doing very well in regard to his plantar foot ulcer. He's been tolerating the dressing changes without complication. There is some callous buildup though not nearly as much as noted last week. Subsequently there is some need for sharp debridement today. 07/05/18  on evaluation today patient appears to be doing much better in regard to his plantar foot ulcer. This is not completely healed yet but it is thinking this morning and appears better than last week's evaluation. He has been tolerating the dressing changes without complication. 07/12/18 on evaluation today patient's foot ulcer actually appears to be showing some progress but again is not completely healed at this point. He is gonna require some sharp debridement today unfortunately. 07/18/18 on evaluation today patient appears to be doing about the same in regard to the plantar foot wound. Fortunately there's no signs of active infection. Unfortunately he hasn't made quite as much progress as I'd like to have seen. I do believe that he is doing better than obviously when he started but we still have some work to do. I think a total contact cast may be something to consider if he does not make a lot of significant improvement over the next week. With that being said the one issue is gonna be that he is done with a wound on his right foot which is going to cause some issues with driving if he has to have a cast the need to have somebody drive him. He's not sure that even has this capability. 07/26/18 on evaluation today patient appears to be doing  better in regard to his plantar foot ulcer. He does have a little bit of a blood blister I believe that collagen is actually drying out and then getting stuck to the wound causing [place] to collect fluid underneath ugly this may be counterproductive. He states that he felt like he was actually doing somewhat better when he was utilizing the alginate as opposed to the collagen. Patient History Information obtained from Patient. Family History Cancer - Siblings, Diabetes - Father, Heart Disease - Mother, Hypertension - Mother, No family history of Hereditary Spherocytosis, Kidney Disease, Lung Disease, Seizures, Stroke, Thyroid Problems, Tuberculosis. Social History Former smoker, Marital Status - Single, Alcohol Use - Never, Drug Use - No History, Caffeine Use - Moderate. Medical History Eyes Denies history of Cataracts, Glaucoma, Optic Neuritis Ear/Nose/Mouth/Throat Denies history of Chronic sinus problems/congestion, Middle ear problems Hematologic/Lymphatic Denies history of Anemia, Hemophilia, Human Immunodeficiency Virus, Lymphedema, Sickle Cell Disease Respiratory Denies history of Aspiration, Asthma, Chronic Obstructive Pulmonary Disease (COPD), Pneumothorax, Sleep Apnea, Tuberculosis Cardiovascular Patient has history of Arrhythmia - a fib, Hypertension Denies history of Angina, Congestive Heart Failure, Coronary Artery Disease, Deep Vein Thrombosis, Hypotension, Myocardial Infarction, Peripheral Arterial Disease, Peripheral Venous Disease, Phlebitis, Vasculitis Gastrointestinal Denies history of Cirrhosis , Colitis, Crohn s, Hepatitis A, Hepatitis B, Hepatitis C Endocrine Patient has history of Type II Diabetes Denies history of Type I Diabetes Messerschmidt, Chastin J. ([id number]) Genitourinary Denies history of End Stage Renal Disease Immunological Denies history of Lupus Erythematosus, Raynaud s, Scleroderma Integumentary (Skin) Denies history of History of Burn, History of  pressure wounds Musculoskeletal Patient has history of Osteoarthritis Denies history of Gout, Rheumatoid Arthritis, Osteomyelitis Neurologic Denies history of Dementia, Neuropathy, Quadriplegia, Paraplegia, Seizure Disorder Oncologic Denies history of Received Chemotherapy, Received Radiation Medical And Surgical History Notes Cardiovascular High Cholesterol Review of Systems (ROS) Constitutional Symptoms (General Health) Denies complaints or symptoms of Fatigue, Fever, Chills, Marked Weight Change. Respiratory Denies complaints or symptoms of Chronic or frequent coughs, Shortness of Breath. Cardiovascular Denies complaints or symptoms of Chest pain, LE edema. Psychiatric Denies complaints or symptoms of Anxiety, Claustrophobia. Objective Constitutional Well-nourished and well-hydrated in no acute distress. Vitals Time Taken: 8:57 AM, Height: 74 in,  Weight: 233 lbs, BMI: 29.9, Temperature: 97.8 F, Pulse: 79 bpm, Respiratory Rate: 16 breaths/min, Blood Pressure: 143/75 mmHg. Respiratory normal breathing without difficulty. clear to auscultation bilaterally. Cardiovascular regular rate and rhythm with normal S1, S2. Psychiatric this patient is able to make decisions and demonstrates good insight into disease process. Alert and Oriented x 3. pleasant and cooperative. General Notes: In regard to the patient's plantar wound I did have to perform some sharp debridement clean away callous, Slough, and a little bit of a hematoma underneath the surface of where the skin had somewhat grown over a still open wound. Nonetheless I explained to the patient this is not completely healed although it is much closer than last time it does appear to DEREN, DEGRAZIA. ([id number]) be doing better. Nonetheless I do feel like that he needs to potentially have a little bit more offloading we may put a phone underneath the foot in order to help offload. Integumentary (Hair, Skin) Wound #4 status is  Open. Original cause of wound was Pressure Injury. The wound is located on the [place]. The wound measures 0.2cm length x 0.2cm width x 0.2cm depth; 0.031cm^2 area and 0.006cm^3 volume. There is Fat Layer (Subcutaneous Tissue) Exposed exposed. There is no tunneling or undermining noted. There is a small amount of sanguinous drainage noted. The wound margin is indistinct and nonvisible. There is no granulation within the wound bed. There is a large (67-100%) amount of necrotic tissue within the wound bed including Eschar and Adherent Slough. Assessment Active Problems ICD-10 Type 2 diabetes mellitus with foot ulcer Non-pressure chronic ulcer of other part of right foot with fat layer exposed Essential (primary) hypertension Chronic atrial fibrillation, unspecified Procedures Wound #4 Pre-procedure diagnosis of Wound #4 is a Diabetic Wound/Ulcer of the Lower Extremity located on the Right,Plantar Foot .Severity of Tissue Pre Debridement is: Fat layer exposed. There was a Excisional Skin/Subcutaneous Tissue Debridement with a total area of 0.04 sq cm performed by STONE III, Bianka Liberati E., PA-C. With the following instrument(s): Curette to remove Viable and Non-Viable tissue/material. Material removed includes Callus, Subcutaneous Tissue, and Slough after achieving pain control using Lidocaine 4% Topical Solution. No specimens were taken. A time out was conducted at 09:07, prior to the start of the procedure. A Minimum amount of bleeding was controlled with Pressure. The procedure was tolerated well with a pain level of 0 throughout and a pain level of 0 following the procedure. Post Debridement Measurements: 0.3cm length x 0.5cm width x 0.2cm depth; 0.024cm^3 volume. Character of Wound/Ulcer Post Debridement is improved. Severity of Tissue Post Debridement is: Fat layer exposed. Post procedure Diagnosis Wound #4: Same as Pre-Procedure Plan Wound Cleansing: Wound #4 Right,Plantar  Foot: Clean wound with Normal Saline. Anesthetic (add to Medication List): Wound #4 Right,Plantar Foot: Topical Lidocaine 4% cream applied to wound bed prior to debridement (In Clinic Only). Primary Wound Dressing: Wound #4 Right,Plantar Foot: Silver Alginate Krienke, Trestan J. ([id number]) Secondary Dressing: Wound #4 Right,Plantar Foot: Foam [place] Dressing Change Frequency: Wound #4 Right,Plantar Foot: Change dressing every other day. Follow-up Appointments: Wound #4 Right,Plantar Foot: Return Appointment in 1 week. Edema Control: Wound #4 Right,Plantar Foot: Elevate legs to the level of the heart and pump ankles as often as possible Off-Loading: Wound #4 Right,Plantar Foot: Open toe surgical shoe with peg assist. - right Additional Orders / Instructions: Wound #4 Right,Plantar Foot: Increase protein intake. I'm in a recommend that we initiate the above wound to measures for the next week and the  patient is in agreement with plan. We will subsequently see were things stand at follow-up. If anything changes or worsens in the meantime he will contact the office and let me know. Otherwise my hope is that he will continue to show signs of good improvement going forward. Hopefully in fact this will completely close shortly. Please see above for specific wound care orders. We will see patient for re-evaluation in 1 week(s) here in the clinic. If anything worsens or changes patient will contact our office for additional recommendations. Electronic Signature(s) Signed: 07/27/2018 1:30:00 AM By: Worthy Keeler PA-C Entered By: Worthy Keeler on 07/26/2018 13:09:11 Mcconathy, [name] ([id number]) -------------------------------------------------------------------------------- ROS/PFSH Details Patient Name: [name], [name] Date of Service: 07/26/2018 9:00 AM Medical Record Number: [id number] Patient Account Number: [id number] Date of Birth/Sex: 1950-09-10 (68 y.o. M) Treating RN:  Montey Hora Primary Care Provider: Benita Stabile Other Clinician: Referring Provider: Benita Stabile Treating Provider/Extender: STONE III, Jeferson Boozer Weeks in Treatment: 5 Information Obtained From Patient Constitutional Symptoms (General Health) Complaints and Symptoms: Negative for: Fatigue; Fever; Chills; Marked Weight Change Respiratory Complaints and Symptoms: Negative for: Chronic or frequent coughs; Shortness of Breath Medical History: Negative for: Aspiration; Asthma; Chronic Obstructive Pulmonary Disease (COPD); Pneumothorax; Sleep Apnea; Tuberculosis Cardiovascular Complaints and Symptoms: Negative for: Chest pain; LE edema Medical History: Positive for: Arrhythmia - a fib; Hypertension Negative for: Angina; Congestive Heart Failure; Coronary Artery Disease; Deep Vein Thrombosis; Hypotension; Myocardial Infarction; Peripheral Arterial Disease; Peripheral Venous Disease; Phlebitis; Vasculitis Past Medical History Notes: High Cholesterol Psychiatric Complaints and Symptoms: Negative for: Anxiety; Claustrophobia Eyes Medical History: Negative for: Cataracts; Glaucoma; Optic Neuritis Ear/Nose/Mouth/Throat Medical History: Negative for: Chronic sinus problems/congestion; Middle ear problems Hematologic/Lymphatic Medical History: Negative for: Anemia; Hemophilia; Human Immunodeficiency Virus; Lymphedema; Sickle Cell Disease Gastrointestinal KENDRY, PFARR. ([id number]) Medical History: Negative for: Cirrhosis ; Colitis; Crohnos; Hepatitis A; Hepatitis B; Hepatitis C Endocrine Medical History: Positive for: Type II Diabetes Negative for: Type I Diabetes Time with diabetes: 3 years Treated with: Oral agents Blood sugar tested every day: No Genitourinary Medical History: Negative for: End Stage Renal Disease Immunological Medical History: Negative for: Lupus Erythematosus; Raynaudos; Scleroderma Integumentary (Skin) Medical History: Negative for: History of Burn;  History of pressure wounds Musculoskeletal Medical History: Positive for: Osteoarthritis Negative for: Gout; Rheumatoid Arthritis; Osteomyelitis Neurologic Medical History: Negative for: Dementia; Neuropathy; Quadriplegia; Paraplegia; Seizure Disorder Oncologic Medical History: Negative for: Received Chemotherapy; Received Radiation Immunizations Pneumococcal Vaccine: Received Pneumococcal Vaccination: Yes Immunization Notes: up to date Implantable Devices No devices added Family and Social History Cancer: Yes - Siblings; Diabetes: Yes - Father; Heart Disease: Yes - Mother; Hereditary Spherocytosis: No; Hypertension: Yes - Mother; Kidney Disease: No; Lung Disease: No; Seizures: No; Stroke: No; Thyroid Problems: No; Tuberculosis: No; Former smoker; Marital Status - Single; Alcohol Use: Never; Drug Use: No History; Caffeine Use: Moderate; Financial Concerns: No; Food, Clothing or Shelter Needs: No; Support System Lacking: No; Transportation Concerns: No LASHAN, GLUTH ([id number]) Physician Affirmation I have reviewed and agree with the above information. Electronic Signature(s) Signed: 07/26/2018 5:36:25 PM By: Montey Hora Signed: 07/27/2018 1:30:00 AM By: Worthy Keeler PA-C Entered By: Worthy Keeler on 07/26/2018 13:07:55 Fuhs, [name] ([id number]) -------------------------------------------------------------------------------- SuperBill Details Patient Name: [name], [name]. Date of Service: 07/26/2018 Medical Record Number: [id number] Patient Account Number: [id number] Date of Birth/Sex: 1950/12/05 (68 y.o. M) Treating RN: Montey Hora Primary Care Provider: Benita Stabile Other Clinician: Referring Provider: Benita Stabile Treating Provider/Extender: STONE III, Victoriano Campion Weeks in Treatment: 5 Diagnosis  Coding ICD-10 Codes Code Description E11.621 Type 2 diabetes mellitus with foot ulcer L97.512 Non-pressure chronic ulcer of other part of right foot with fat layer  exposed I10 Essential (primary) hypertension I48.20 Chronic atrial fibrillation, unspecified Facility Procedures CPT4 Code: 91791505 Description: 69794 - DEB SUBQ TISSUE 20 SQ CM/< ICD-10 Diagnosis Description L97.512 Non-pressure chronic ulcer of other part of right foot with fa Modifier: t layer exposed Quantity: 1 Physician Procedures CPT4 Code: 8016553 Description: 11042 - WC PHYS SUBQ TISS 20 SQ CM ICD-10 Diagnosis Description L97.512 Non-pressure chronic ulcer of other part of right foot with fa Modifier: t layer exposed Quantity: 1 Electronic Signature(s) Signed: 07/27/2018 1:30:00 AM By: Worthy Keeler PA-C Entered By: Worthy Keeler on 07/26/2018 13:09:29

## 2018-07-29 DIAGNOSIS — M1000 Idiopathic gout, unspecified site: Secondary | ICD-10-CM | POA: Diagnosis not present

## 2018-07-29 DIAGNOSIS — E785 Hyperlipidemia, unspecified: Secondary | ICD-10-CM | POA: Diagnosis not present

## 2018-07-29 DIAGNOSIS — E119 Type 2 diabetes mellitus without complications: Secondary | ICD-10-CM | POA: Diagnosis not present

## 2018-07-29 DIAGNOSIS — E038 Other specified hypothyroidism: Secondary | ICD-10-CM | POA: Diagnosis not present

## 2018-08-02 ENCOUNTER — Ambulatory Visit (INDEPENDENT_AMBULATORY_CARE_PROVIDER_SITE_OTHER): Payer: PPO

## 2018-08-02 ENCOUNTER — Encounter: Payer: Self-pay | Admitting: Podiatry

## 2018-08-02 ENCOUNTER — Encounter: Payer: PPO | Admitting: Internal Medicine

## 2018-08-02 ENCOUNTER — Other Ambulatory Visit: Payer: Self-pay

## 2018-08-02 ENCOUNTER — Ambulatory Visit: Payer: PPO | Admitting: Podiatry

## 2018-08-02 VITALS — Temp 98.5°F

## 2018-08-02 DIAGNOSIS — E0843 Diabetes mellitus due to underlying condition with diabetic autonomic (poly)neuropathy: Secondary | ICD-10-CM | POA: Diagnosis not present

## 2018-08-02 DIAGNOSIS — M2041 Other hammer toe(s) (acquired), right foot: Secondary | ICD-10-CM

## 2018-08-02 DIAGNOSIS — L89899 Pressure ulcer of other site, unspecified stage: Secondary | ICD-10-CM | POA: Diagnosis not present

## 2018-08-02 DIAGNOSIS — E11621 Type 2 diabetes mellitus with foot ulcer: Secondary | ICD-10-CM | POA: Diagnosis not present

## 2018-08-02 DIAGNOSIS — L97512 Non-pressure chronic ulcer of other part of right foot with fat layer exposed: Secondary | ICD-10-CM | POA: Diagnosis not present

## 2018-08-03 DIAGNOSIS — E11621 Type 2 diabetes mellitus with foot ulcer: Secondary | ICD-10-CM | POA: Diagnosis not present

## 2018-08-03 NOTE — Progress Notes (Signed)
TOMMIE, [name] ([id number]) Visit Report for 08/02/2018 HPI Details Patient Name: [name], [name]. Date of Service: 08/02/2018 1:00 PM Medical Record Number: [id number] Patient Account Number: [id number] Date of Birth/Sex: [birth year] (68 y.o. M) Treating RN: Montey Hora Primary Care Provider: Benita Stabile Other Clinician: Referring Provider: Benita Stabile Treating Provider/Extender: Beverly Gust in Treatment: 6 History of Present Illness Associated Signs and Symptoms: Patient has a history of diabetes mellitus type II, hypertension, and atrial fibrillation. He also has a hammertoe deformity of the bilateral feet which seems to be causing pressure in the ball of his foot. HPI Description: 03/09/17 on evaluation today patient presents for his initial evaluation concerning an ulcer on the plantar aspect of his right foot which has been open he tells me for about three weeks. He has been seen at [facility] where they did perform debridement it appears according to a note on 02/22/17 where unfortunately it appears that his callous area began to break down into an ulcer after modifications have been added to his insulin. This had happened previously as well. However I do not have details of the severity of debridement at that point although it does not sound as if you the debridement was too significant based on what the patient is telling me that he still had a lot of callous following debridement. With that being said they were going to look into altering his insoles to try to prevent further breakdown in the future. Nonetheless he has had foul odor discharge coming from the ulcer which is noted all the way back to that visit on 02/22/[facility]. Patient states that this was concerning him more than anything else. He does have diabetes although he described this as "borderline diabetes" he is on metoprolol however along with lisinopril. Patient is not having any issues with  pain in regard to his right plantar foot. 03/16/17 on evaluation today patient appears to be doing much better in regard to his plantar foot ulcer. He actually tells me that he loves the peg assist offloading shoe and that he hasn't had any pain in the ulcer area from the callous since I worked on it last week. Overall he is extremely happy with how things have progressed. Likewise the wound bed has no slough noted there is no evidence of infection and it looks excellent. I did receive the results of his hemoglobin A1c which showed a value of 5.9 which was elevated but actually rather well. Subsequently I did also receive the x-ray of his foot which showed diffuse degenerative change but no underlying acute bony abnormality. 03/23/17 on evaluation today patient appears to be doing better in regard to his right plantar foot ulcer. He continues to show signs of improvement there's definitely not as much drainage at this point. He has been tolerating the dressing changes without complication he is not happy with the peg assist offloading shoe but at the same time I do believe that it is making good progress as far as offloading is concerned. I still believe he [name] need to talk to a surgeon about surgically correcting a hammertoe in order to avoid additional pressure to the site especially since he Artie has diabetic shoes and they do not seem to have prevented callous buildup in ulcer formation. 03/30/17 on evaluation today patient appears to be doing better in regard to his right plantar foot ulcer. Continues to show signs of improvement which is good news. Unfortunately we were unable to get  the appointment with the orthopedic, Dr. Doran Durand, whom I recommended for him due to patient having a balance at the practice. With that being said patient's foot ulcer does seem to be doing much better on evaluation today he still has some depth to the wound but overall we are seeing improvement and  epithelialization week by week. Hopefully this is something that will close shortly. 04/05/17 on evaluation today patient unfortunately has what appears to be an area of the plantar surface of his ulcer where he had fluid collection where the callous grew over top of the wound bed. Unfortunately there was some pus like material noted during cleansing that we did sent for culture today. Hopefully this is not truly an infection is or does not appear to be any evidence of erythema surrounding and maybe this is just simply a small setback with a fluid collection that has caused this issue. Nonetheless we will see what that shows when we get the culture back. I am also going to have it completely antibiotic which I previously placed him on anyway which will hopefully prevent any true infection from setting in. [name], [name] ([id number]) 04/12/17 on evaluation today patient's ulcer on the plantar aspect of his right foot actually appears to be better then during last weeks evaluation. In general he has been tolerating the dressing changes in utilizing the offloading shoe which does seem to be beneficial for him. With that being said he still seems to get some pressure to the area just not nearly as much as he previously had noted. Again fortunately there is no significant pain. 04/19/17 on evaluation today patient appears to be doing excellent in regard to his right foot ulcer. He has been tolerating the dressing changes without complication. There really has not been any drainage over the past few days he tells me. With that being said he seems to be doing excellent in my opinion at this point he does have some callous buildup. 04/26/17 On evaluation today patient's wound appears to be completely healed which is great news. He has been tolerating the dressing changes without complication and I do think he has done very well in regard to the healing process and offloading he has listed everything that that we  instructed him to do. Obviously this appears to have paid off and he has progressed very nicely. Readmission: 07/05/17 on evaluation today patient is seen for fault evaluation and our clinic concerning the same issue that I have previously treated him for ending back in April 2019. This is a callous region with subsequently an ulcer underneath this on the plantar aspect of his right second metatarsal region. Fortunately he's not having any significant discomfort although his girlfriend told him that she noted an area of bruising at the site underneath the callous and wanted him to come get this checked out. He was very please with our care previous and therefore was more than happy to come let us check this for him. Upon inspection initially he did have significant callous overlying the area in question. It was not easily identifiable as far as any ulcer was concerned. With that being said he with this significant callous did require some sharp debridement and following debridement there did appear to be a small ulcer underlying. Fortunately patient in general shows no signs of infection at this point. He again has neuropathy and therefore does not have any ongoing discomfort or pain at this point. 07/12/17 on evaluation today patient actually appears to be  doing very well in regard to his plantar foot ulcer. In fact this appears to be completely healed and there's no residual opening at this time. Overall I'm very pleased in this regard. Patient also is extremely pleased. Readmission: 02/08/18 patient presents today for follow-up evaluation he has previously been seen here in our office for the same issue. Fortunately there is not been any evidence of infection since I last saw him he does note that he is been going to see his local podiatrist for callous pairing. During that time they actually noted that he had an open wound and subsequently referred him back ties for evaluation of this wound. He  has done very well in the past with good wound care often healing in just a few weeks. No fevers, chills, nausea, or vomiting noted at this time. 02/15/18 on evaluation today patient actually appears to be doing very well in regard to his plantar foot ulcer. He is been tolerating the dressing changes without complication. There does appear to be some eschar of the line the wound surface. Fortunately I am happy with how things are progressing from the standpoint of the wound progress from last week to this week. 02/22/18 on evaluation today patient appears to be doing very well in regard to his plantar foot ulcer. He did have callous buildup around the area that required some sharp debridement today. He tolerated the debridement without complication. Post debridement the wound bed appears to be doing much better there's just a very tiny opening still remaining. 03/01/18 on evaluation today patient appears to be doing excellent in regard to his plantar foot ulcer. In fact after I debrided away what appeared to be a dried blood clot on the surface and actually appear there was just a very small opening remaining in the central portion of the wound. This is literally a pinpoint region. Overall he has done well and I'm very pleased in this regard. I think that likely one more week will see this area completely healed. 03/08/18 on evaluation today patient appears to actually be doing very well in regard to his plantar foot ulcer. He has been tolerating the dressing changes without complication. Fortunately this appears to be completely healed at this point which is excellent news. Overall I'm extremely happy with how things have progressed. 06/21/18 on evaluation today patient presents for evaluation in our clinic concerning issues that he's having with you seeing the area of his right foot that is previously experienced issues with. Fortunately there does not appear to be any evidence of infection at this time  which is good news. With that being said he tells me that he has noted the callous buildup of the past several months but it was only in the past roughly week that he has noted drainage from the callous. He's not having any Shipp, Quintez J. ([id number]) significant discomfort other than the fact that he states walking on the area with the extensive callous seems to be an issue. 06/28/18 on evaluation today patient actually appears to be doing very well in regard to his plantar foot ulcer. He's been tolerating the dressing changes without complication. There is some callous buildup though not nearly as much as noted last week. Subsequently there is some need for sharp debridement today. 07/05/18 on evaluation today patient appears to be doing much better in regard to his plantar foot ulcer. This is not completely healed yet but it is thinking this morning and appears better than last week's evaluation. He  has been tolerating the dressing changes without complication. 07/12/18 on evaluation today patient's foot ulcer actually appears to be showing some progress but again is not completely healed at this point. He is gonna require some sharp debridement today unfortunately. 07/18/18 on evaluation today patient appears to be doing about the same in regard to the plantar foot wound. Fortunately there's no signs of active infection. Unfortunately he hasn't made quite as much progress as I'd like to have seen. I do believe that he is doing better than obviously when he started but we still have some work to do. I think a total contact cast [name] be something to consider if he does not make a lot of significant improvement over the next week. With that being said the one issue is gonna be that he is done with a wound on his right foot which is going to cause some issues with driving if he has to have a cast the need to have somebody drive him. He's not sure that even has this capability. 07/26/18 on evaluation today  patient appears to be doing better in regard to his plantar foot ulcer. He does have a little bit of a blood blister I believe that collagen is actually drying out and then getting stuck to the wound causing [place] to collect fluid underneath ugly this [name] be counterproductive. He states that he felt like he was actually doing somewhat better when he was utilizing the alginate as opposed to the collagen. 08/02/18-Patient returns at 1 week for evaluation of right plantar foot wound. He was just at the podiatry office where they removed some of the surrounding callus adjacent to the wound. The wound appears to be doing well and we are using silver cell at this time. Electronic Signature(s) Signed: 08/02/2018 1:31:54 PM By: Tobi Bastos Entered By: Tobi Bastos on 08/02/2018 13:31:54 [name], [name] ([id number]) -------------------------------------------------------------------------------- Physical Exam Details Patient Name: [name], [name]. Date of Service: 08/02/2018 1:00 PM Medical Record Number: [id number] Patient Account Number: [id number] Date of Birth/Sex: [birth year] (68 y.o. M) Treating RN: Montey Hora Primary Care Provider: Benita Stabile Other Clinician: Referring Provider: Benita Stabile Treating Provider/Extender: Beverly Gust in Treatment: 6 Constitutional alert and oriented x 3. sitting or standing blood pressure is within target range for patient.. supine blood pressure is within target range for patient.. pulse regular and within target range for patient.[name] respirations regular, non-labored and within target range for patient.[name] temperature within target range for patient.. . . Well-nourished and well-hydrated in no acute distress. Notes Plantar wound right foot does not probe, rim of surrounding callus has been taken care of today, at the podiatry office, the wound looks to have healthy granulation tissue. Electronic Signature(s) Signed: 08/02/2018 1:32:39 PM By:  Tobi Bastos Entered By: Tobi Bastos on 08/02/2018 13:32:39 [name], [name] ([id number]) -------------------------------------------------------------------------------- Physician Orders Details Patient Name: [name], [name] Date of Service: 08/02/2018 1:00 PM Medical Record Number: [id number] Patient Account Number: [id number] Date of Birth/Sex: 1950/06/07 (68 y.o. M) Treating RN: Montey Hora Primary Care Provider: Benita Stabile Other Clinician: Referring Provider: Benita Stabile Treating Provider/Extender: Beverly Gust in Treatment: 6 Verbal / Phone Orders: No Diagnosis Coding Wound Cleansing Wound #4 Right,Plantar Foot o Clean wound with Normal Saline. Anesthetic (add to Medication List) Wound #4 Right,Plantar Foot o Topical Lidocaine 4% cream applied to wound bed prior to debridement (In Clinic Only). Primary Wound Dressing Wound #4 Right,Plantar Foot o Silver Alginate Secondary Dressing Wound #4 Right,Plantar Foot o Foam o [place] Dressing Change Frequency Wound #4 Right,Plantar Foot o Change dressing every other day. Follow-up Appointments Wound #4 Right,Plantar Foot o Return Appointment in 1 week. Edema Control Wound #4 Right,Plantar Foot o Elevate legs to the level of the heart and pump ankles as often as possible Off-Loading Wound #4 Right,Plantar Foot o Open toe surgical shoe with peg assist. - right Additional Orders / Instructions Wound #4 Right,Plantar Foot o Increase protein intake. Electronic Signature(s) Signed: 08/02/2018 4:11:29 PM By: Felipa Emory ([id number]) Signed: 08/02/2018 4:43:58 PM By: Montey Hora Entered By: Montey Hora on 08/02/2018 13:30:29 [name], [name] ([id number]) -------------------------------------------------------------------------------- Progress Note Details Patient Name: [name], [name]. Date of Service: 08/02/2018 1:00 PM Medical Record Number:  [id number] Patient Account Number: [id number] Date of Birth/Sex: 1950-04-20 (68 y.o. M) Treating RN: Montey Hora Primary Care Provider: Benita Stabile Other Clinician: Referring Provider: Benita Stabile Treating Provider/Extender: Beverly Gust in Treatment: 6 Subjective History of Present Illness (HPI) The following HPI elements were documented for the patient's wound: Associated Signs and Symptoms: Patient has a history of diabetes mellitus type II, hypertension, and atrial fibrillation. He also has a hammertoe deformity of the bilateral feet which seems to be causing pressure in the ball of his foot. 03/09/17 on evaluation today patient presents for his initial evaluation concerning an ulcer on the plantar aspect of his right foot which has been open he tells me for about three weeks. He has been seen at [facility] where they did perform debridement it appears according to a note on 02/22/17 where unfortunately it appears that his callous area began to break down into an ulcer after modifications have been added to his insulin. This had happened previously as well. However I do not have details of the severity of debridement at that point although it does not sound as if you the debridement was too significant based on what the patient is telling me that he still had a lot of callous following debridement. With that being said they were going to look into altering his insoles to try to prevent further breakdown in the future. Nonetheless he has had foul odor discharge coming from the ulcer which is noted all the way back to that visit on 02/22/17 [facility]. Patient states that this was concerning him more than anything else. He does have diabetes although he described this as "borderline diabetes" he is on metoprolol however along with lisinopril. Patient is not having any issues with pain in regard to his right plantar foot. 03/16/17 on evaluation today patient appears to be  doing much better in regard to his plantar foot ulcer. He actually tells me that he loves the peg assist offloading shoe and that he hasn't had any pain in the ulcer area from the callous since I worked on it last week. Overall he is extremely happy with how things have progressed. Likewise the wound bed has no slough noted there is no evidence of infection and it looks excellent. I did receive the results of his hemoglobin A1c which showed a value of 5.9 which was elevated but actually rather well. Subsequently I did also receive the x-ray of his foot which showed diffuse degenerative change but no underlying acute bony abnormality. 03/23/17 on evaluation today patient appears to be doing better in regard to his right plantar foot ulcer. He continues to show signs of improvement there's definitely not as much drainage at this point. He has been tolerating the dressing changes  without complication he is not happy with the peg assist offloading shoe but at the same time I do believe that it is making good progress as far as offloading is concerned. I still believe he [name] need to talk to a surgeon about surgically correcting a hammertoe in order to avoid additional pressure to the site especially since he Artie has diabetic shoes and they do not seem to have prevented callous buildup in ulcer formation. 03/30/17 on evaluation today patient appears to be doing better in regard to his right plantar foot ulcer. Continues to show signs of improvement which is good news. Unfortunately we were unable to get the appointment with the orthopedic, Dr. Doran Durand, whom I recommended for him due to patient having a balance at the practice. With that being said patient's foot ulcer does seem to be doing much better on evaluation today he still has some depth to the wound but overall we are seeing improvement and epithelialization week by week. Hopefully this is something that will close shortly. 04/05/17 on evaluation  today patient unfortunately has what appears to be an area of the plantar surface of his ulcer where he had fluid collection where the callous grew over top of the wound bed. Unfortunately there was some pus like material noted during cleansing that we did sent for culture today. Hopefully this is not truly an infection is or does not appear to be any evidence of erythema surrounding and maybe this is just simply a small setback with a fluid collection that has caused this issue. Nonetheless we will see what that shows when we get the culture back. I am also going to have it completely antibiotic which I previously placed him on anyway which will hopefully prevent any true infection from setting in. 04/12/17 on evaluation today patient's ulcer on the plantar aspect of his right foot actually appears to be better then during last [name], [name] ([id number]) weeks evaluation. In general he has been tolerating the dressing changes in utilizing the offloading shoe which does seem to be beneficial for him. With that being said he still seems to get some pressure to the area just not nearly as much as he previously had noted. Again fortunately there is no significant pain. 04/19/17 on evaluation today patient appears to be doing excellent in regard to his right foot ulcer. He has been tolerating the dressing changes without complication. There really has not been any drainage over the past few days he tells me. With that being said he seems to be doing excellent in my opinion at this point he does have some callous buildup. 04/26/17 On evaluation today patient's wound appears to be completely healed which is great news. He has been tolerating the dressing changes without complication and I do think he has done very well in regard to the healing process and offloading he has listed everything that that we instructed him to do. Obviously this appears to have paid off and he has progressed  very nicely. Readmission: 07/05/17 on evaluation today patient is seen for fault evaluation and our clinic concerning the same issue that I have previously treated him for ending back in April 2019. This is a callous region with subsequently an ulcer underneath this on the plantar aspect of his right second metatarsal region. Fortunately he's not having any significant discomfort although his girlfriend told him that she noted an area of bruising at the site underneath the callous and wanted him to come get this checked out.  He was very please with our care previous and therefore was more than happy to come let us check this for him. Upon inspection initially he did have significant callous overlying the area in question. It was not easily identifiable as far as any ulcer was concerned. With that being said he with this significant callous did require some sharp debridement and following debridement there did appear to be a small ulcer underlying. Fortunately patient in general shows no signs of infection at this point. He again has neuropathy and therefore does not have any ongoing discomfort or pain at this point. 07/12/17 on evaluation today patient actually appears to be doing very well in regard to his plantar foot ulcer. In fact this appears to be completely healed and there's no residual opening at this time. Overall I'm very pleased in this regard. Patient also is extremely pleased. Readmission: 02/08/18 patient presents today for follow-up evaluation he has previously been seen here in our office for the same issue. Fortunately there is not been any evidence of infection since I last saw him he does note that he is been going to see his local podiatrist for callous pairing. During that time they actually noted that he had an open wound and subsequently referred him back ties for evaluation of this wound. He has done very well in the past with good wound care often healing in just a  few weeks. No fevers, chills, nausea, or vomiting noted at this time. 02/15/18 on evaluation today patient actually appears to be doing very well in regard to his plantar foot ulcer. He is been tolerating the dressing changes without complication. There does appear to be some eschar of the line the wound surface. Fortunately I am happy with how things are progressing from the standpoint of the wound progress from last week to this week. 02/22/18 on evaluation today patient appears to be doing very well in regard to his plantar foot ulcer. He did have callous buildup around the area that required some sharp debridement today. He tolerated the debridement without complication. Post debridement the wound bed appears to be doing much better there's just a very tiny opening still remaining. 03/01/18 on evaluation today patient appears to be doing excellent in regard to his plantar foot ulcer. In fact after I debrided away what appeared to be a dried blood clot on the surface and actually appear there was just a very small opening remaining in the central portion of the wound. This is literally a pinpoint region. Overall he has done well and I'm very pleased in this regard. I think that likely one more week will see this area completely healed. 03/08/18 on evaluation today patient appears to actually be doing very well in regard to his plantar foot ulcer. He has been tolerating the dressing changes without complication. Fortunately this appears to be completely healed at this point which is excellent news. Overall I'm extremely happy with how things have progressed. 06/21/18 on evaluation today patient presents for evaluation in our clinic concerning issues that he's having with you seeing the area of his right foot that is previously experienced issues with. Fortunately there does not appear to be any evidence of infection at this time which is good news. With that being said he tells me that he has noted the  callous buildup of the past several months but it was only in the past roughly week that he has noted drainage from the callous. He's not having any significant discomfort other  than the fact that he states walking on the area with the extensive callous seems to be an issue. [name], [name] ([id number]) 06/28/18 on evaluation today patient actually appears to be doing very well in regard to his plantar foot ulcer. He's been tolerating the dressing changes without complication. There is some callous buildup though not nearly as much as noted last week. Subsequently there is some need for sharp debridement today. 07/05/18 on evaluation today patient appears to be doing much better in regard to his plantar foot ulcer. This is not completely healed yet but it is thinking this morning and appears better than last week's evaluation. He has been tolerating the dressing changes without complication. 07/12/18 on evaluation today patient's foot ulcer actually appears to be showing some progress but again is not completely healed at this point. He is gonna require some sharp debridement today unfortunately. 07/18/18 on evaluation today patient appears to be doing about the same in regard to the plantar foot wound. Fortunately there's no signs of active infection. Unfortunately he hasn't made quite as much progress as I'd like to have seen. I do believe that he is doing better than obviously when he started but we still have some work to do. I think a total contact cast [name] be something to consider if he does not make a lot of significant improvement over the next week. With that being said the one issue is gonna be that he is done with a wound on his right foot which is going to cause some issues with driving if he has to have a cast the need to have somebody drive him. He's not sure that even has this capability. 07/26/18 on evaluation today patient appears to be doing better in regard to his plantar foot ulcer. He  does have a little bit of a blood blister I believe that collagen is actually drying out and then getting stuck to the wound causing [place] to collect fluid underneath ugly this [name] be counterproductive. He states that he felt like he was actually doing somewhat better when he was utilizing the alginate as opposed to the collagen. 08/02/18-Patient returns at 1 week for evaluation of right plantar foot wound. He was just at the podiatry office where they removed some of the surrounding callus adjacent to the wound. The wound appears to be doing well and we are using silver cell at this time. Objective Constitutional alert and oriented x 3. sitting or standing blood pressure is within target range for patient.. supine blood pressure is within target range for patient.. pulse regular and within target range for patient.[name] respirations regular, non-labored and within target range for patient.[name] temperature within target range for patient.. Well-nourished and well-hydrated in no acute distress. Vitals Time Taken: 1:04 PM, Height: 74 in, Weight: 233 lbs, BMI: 29.9, Temperature: 98.3 F, Pulse: 58 bpm, Respiratory Rate: 16 breaths/min, Blood Pressure: 131/87 mmHg. General Notes: Plantar wound right foot does not probe, rim of surrounding callus has been taken care of today, at the podiatry office, the wound looks to have healthy granulation tissue. Integumentary (Hair, Skin) Wound #4 status is Open. Original cause of wound was Pressure Injury. The wound is located on the [place]. The wound measures 0.8cm length x 0.9cm width x 0.3cm depth; 0.565cm^2 area and 0.17cm^3 volume. There is Fat Layer (Subcutaneous Tissue) Exposed exposed. There is no tunneling or undermining noted. There is a small amount of sanguinous drainage noted. The wound margin is indistinct and nonvisible. There is  small (1-33%) red granulation within the wound bed. There is a medium (34-66%) amount of necrotic tissue within the  wound bed including Eschar and Adherent Slough. [name], [name] ([id number]) Plan Wound Cleansing: Wound #4 Right,Plantar Foot: Clean wound with Normal Saline. Anesthetic (add to Medication List): Wound #4 Right,Plantar Foot: Topical Lidocaine 4% cream applied to wound bed prior to debridement (In Clinic Only). Primary Wound Dressing: Wound #4 Right,Plantar Foot: Silver Alginate Secondary Dressing: Wound #4 Right,Plantar Foot: Foam [place] Dressing Change Frequency: Wound #4 Right,Plantar Foot: Change dressing every other day. Follow-up Appointments: Wound #4 Right,Plantar Foot: Return Appointment in 1 week. Edema Control: Wound #4 Right,Plantar Foot: Elevate legs to the level of the heart and pump ankles as often as possible Off-Loading: Wound #4 Right,Plantar Foot: Open toe surgical shoe with peg assist. - right Additional Orders / Instructions: Wound #4 Right,Plantar Foot: Increase protein intake. 1. Continue with silver cell dressing to the right plantar wound 2. Patient is doing the offloading with the surgical shoe 3. Return to clinic next week Electronic Signature(s) Signed: 08/02/2018 1:33:10 PM By: Tobi Bastos Entered By: Tobi Bastos on 08/02/2018 13:33:10 Antrobus, [name] ([id number]) -------------------------------------------------------------------------------- SuperBill Details Patient Name: [name] Date of Service: 08/02/2018 Medical Record Number: [id number] Patient Account Number: [id number] Date of Birth/Sex: 08/04/1950 (68 y.o. M) Treating RN: Montey Hora Primary Care Provider: Benita Stabile Other Clinician: Referring Provider: Benita Stabile Treating Provider/Extender: Beverly Gust in Treatment: 6 Diagnosis Coding ICD-10 Codes Code Description E11.621 Type 2 diabetes mellitus with foot ulcer L97.512 Non-pressure chronic ulcer of other part of right foot with fat layer exposed I10 Essential (primary)  hypertension I48.20 Chronic atrial fibrillation, unspecified Facility Procedures CPT4 Code: 48889169 Description: 99213 - WOUND CARE VISIT-LEV 3 EST PT Modifier: Quantity: 1 Physician Procedures CPT4 Code: 4503888 Description: 28003 - WC PHYS LEVEL 3 - EST PT ICD-10 Diagnosis Description E11.621 Type 2 diabetes mellitus with foot ulcer Modifier: Quantity: 1 Electronic Signature(s) Signed: 08/02/2018 1:33:27 PM By: Tobi Bastos Entered By: Tobi Bastos on 08/02/2018 13:33:26

## 2018-08-03 NOTE — Progress Notes (Signed)
[name], [name] ([id number]) Visit Report for 08/02/2018 Arrival Information Details Patient Name: [name], [name]. Date of Service: 08/02/2018 1:00 PM Medical Record Number: [id number] Patient Account Number: [id number] Date of Birth/Sex: 05-13-1950 (68 y.o. M) Treating RN: Army Melia Primary Care Leopoldo Mazzie: Benita Stabile Other Clinician: Referring Alonnie Bieker: Benita Stabile Treating Mada Sadik/Extender: Beverly Gust in Treatment: 6 Visit Information History Since Last Visit Added or deleted any medications: No Patient Arrived: Ambulatory Any new allergies or adverse reactions: No Arrival Time: 13:04 Had a fall or experienced change in No Accompanied By: self activities of daily living that may affect Transfer Assistance: None risk of falls: Signs or symptoms of abuse/neglect since last visito No Hospitalized since last visit: No Has Dressing in Place as Prescribed: Yes Pain Present Now: No Electronic Signature(s) Signed: 08/02/2018 3:04:02 PM By: Army Melia Entered By: Army Melia on 08/02/2018 13:04:47 [name], [name] ([id number]) -------------------------------------------------------------------------------- Clinic Level of Care Assessment Details Patient Name: [name], [name]. Date of Service: 08/02/2018 1:00 PM Medical Record Number: [id number] Patient Account Number: [id number] Date of Birth/Sex: May 14, 1950 (68 y.o. M) Treating RN: Montey Hora Primary Care Sunya Humbarger: Benita Stabile Other Clinician: Referring Elese Rane: Benita Stabile Treating [place] Cloe/Extender: Beverly Gust in Treatment: 6 Clinic Level of Care Assessment Items TOOL 4 Quantity Score []  - Use when only an EandM is performed on FOLLOW-UP visit 0 ASSESSMENTS - Nursing Assessment / Reassessment X - Reassessment of Co-morbidities (includes updates in patient status) 1 10 X- 1 5 Reassessment of Adherence to Treatment Plan ASSESSMENTS - Wound and Skin Assessment / Reassessment X - Simple Wound  Assessment / Reassessment - one wound 1 5 []  - 0 Complex Wound Assessment / Reassessment - multiple wounds []  - 0 Dermatologic / Skin Assessment (not related to wound area) ASSESSMENTS - Focused Assessment []  - Circumferential Edema Measurements - multi extremities 0 []  - 0 Nutritional Assessment / Counseling / Intervention X- 1 5 Lower Extremity Assessment (monofilament, tuning fork, pulses) []  - 0 Peripheral Arterial Disease Assessment (using hand held doppler) ASSESSMENTS - Ostomy and/or Continence Assessment and Care []  - Incontinence Assessment and Management 0 []  - 0 Ostomy Care Assessment and Management (repouching, etc.) PROCESS - Coordination of Care X - Simple Patient / Family Education for ongoing care 1 15 []  - 0 Complex (extensive) Patient / Family Education for ongoing care X- 1 10 Staff obtains Programmer, systems, Records, Test Results / Process Orders []  - 0 Staff telephones HHA, Nursing Homes / Clarify orders / etc []  - 0 Routine Transfer to another Facility (non-emergent condition) []  - 0 Routine Hospital Admission (non-emergent condition) []  - 0 New Admissions / Biomedical engineer / Ordering NPWT, Apligraf, etc. []  - 0 Emergency Hospital Admission (emergent condition) X- 1 10 Simple Discharge Coordination AARAV, BURGETT. ([id number]) []  - 0 Complex (extensive) Discharge Coordination PROCESS - Special Needs []  - Pediatric / Minor Patient Management 0 []  - 0 Isolation Patient Management []  - 0 Hearing / Language / Visual special needs []  - 0 Assessment of Community assistance (transportation, D/C planning, etc.) []  - 0 Additional assistance / Altered mentation []  - 0 Support Surface(s) Assessment (bed, cushion, seat, etc.) INTERVENTIONS - Wound Cleansing / Measurement X - Simple Wound Cleansing - one wound 1 5 []  - 0 Complex Wound Cleansing - multiple wounds X- 1 5 Wound Imaging (photographs - any number of wounds) []  - 0 Wound Tracing (instead of  photographs) X- 1 5 Simple Wound Measurement - one wound []  - 0 Complex Wound Measurement -  multiple wounds INTERVENTIONS - Wound Dressings X - Small Wound Dressing one or multiple wounds 1 10 []  - 0 Medium Wound Dressing one or multiple wounds []  - 0 Large Wound Dressing one or multiple wounds []  - 0 Application of Medications - topical []  - 0 Application of Medications - injection INTERVENTIONS - Miscellaneous []  - External ear exam 0 []  - 0 Specimen Collection (cultures, biopsies, blood, body fluids, etc.) []  - 0 Specimen(s) / Culture(s) sent or taken to Lab for analysis []  - 0 Patient Transfer (multiple staff / Civil Service fast streamer / Similar devices) []  - 0 Simple Staple / Suture removal (25 or less) []  - 0 Complex Staple / Suture removal (26 or more) []  - 0 Hypo / Hyperglycemic Management (close monitor of Blood Glucose) []  - 0 Ankle / Brachial Index (ABI) - do not check if billed separately X- 1 5 Vital Signs [name], [name] ([id number]) Has the patient been seen at the hospital within the last three years: Yes Total Score: 90 Level Of Care: New/Established - Level 3 Electronic Signature(s) Signed: 08/02/2018 4:43:58 PM By: Montey Hora Entered By: Montey Hora on 08/02/2018 13:31:25 [name], [name] ([id number]) -------------------------------------------------------------------------------- Encounter Discharge Information Details Patient Name: [name] Jewett. Date of Service: 08/02/2018 1:00 PM Medical Record Number: [id number] Patient Account Number: [id number] Date of Birth/Sex: 11/07/1950 (68 y.o. M) Treating RN: Montey Hora Primary Care Arietta Eisenstein: Benita Stabile Other Clinician: Referring [place] Sarinana: Benita Stabile Treating Awad Gladd/Extender: Beverly Gust in Treatment: 6 Encounter Discharge Information Items Discharge Condition: Stable Ambulatory Status: Ambulatory Discharge Destination: Home Transportation: Private Auto Accompanied By: self Schedule  Follow-up Appointment: Yes Clinical Summary of Care: Electronic Signature(s) Signed: 08/02/2018 4:43:58 PM By: Montey Hora Entered By: Montey Hora on 08/02/2018 13:39:45 [name], [name] ([id number]) -------------------------------------------------------------------------------- Lower Extremity Assessment Details Patient Name: [name] Jewett. Date of Service: 08/02/2018 1:00 PM Medical Record Number: [id number] Patient Account Number: [id number] Date of Birth/Sex: July 14, 1950 (68 y.o. M) Treating RN: Army Melia Primary Care Alaisa Moffitt: Benita Stabile Other Clinician: Referring Kam Kushnir: Benita Stabile Treating Jasmon Graffam/Extender: Beverly Gust in Treatment: 6 Edema Assessment Assessed: [Left: No] [Right: No] Edema: [Left: N] [Right: o] Vascular Assessment Pulses: Dorsalis Pedis Palpable: [Right:Yes] Electronic Signature(s) Signed: 08/02/2018 3:04:02 PM By: Army Melia Entered By: Army Melia on 08/02/2018 13:09:25 [name], [name] ([id number]) -------------------------------------------------------------------------------- Multi Wound Chart Details Patient Name: [name] Jewett. Date of Service: 08/02/2018 1:00 PM Medical Record Number: [id number] Patient Account Number: [id number] Date of Birth/Sex: 14-Oct-1950 (68 y.o. M) Treating RN: Montey Hora Primary Care Breuna Loveall: Benita Stabile Other Clinician: Referring Damani Rando: Benita Stabile Treating Mariaguadalupe Fialkowski/Extender: Beverly Gust in Treatment: 6 Vital Signs Height(in): 74 Pulse(bpm): 58 Weight(lbs): 233 Blood Pressure(mmHg): 131/87 Body Mass Index(BMI): 30 Temperature(F): 98.3 Respiratory Rate 16 (breaths/min): Photos: [N/A:N/A] Wound Location: Right Foot - Plantar N/A N/A Wounding Event: Pressure Injury N/A N/A Primary Etiology: Diabetic Wound/Ulcer of the N/A N/A Lower Extremity Secondary Etiology: Pressure Ulcer N/A N/A Comorbid History: Arrhythmia, Hypertension, N/A N/A Type II  Diabetes, Osteoarthritis Date Acquired: 06/13/2018 N/A N/A Weeks of Treatment: 6 N/A N/A Wound Status: Open N/A N/A Measurements L x W x D 0.8x0.9x0.3 N/A N/A (cm) Area (cm) : 0.565 N/A N/A Volume (cm) : 0.17 N/A N/A % Reduction in Area: 88.00% N/A N/A % Reduction in Volume: 97.00% N/A N/A Classification: Grade 2 N/A N/A Exudate Amount: Small N/A N/A Exudate Type: Sanguinous N/A N/A Exudate Color: red N/A N/A Wound Margin: Indistinct, nonvisible N/A N/A Granulation Amount: Small (1-33%) N/A N/A Granulation Quality:  Red N/A N/A Necrotic Amount: Medium (34-66%) N/A N/A Necrotic Tissue: Eschar, Adherent Slough N/A N/A Exposed Structures: Fat Layer (Subcutaneous N/A N/A Tissue) Exposed: Yes Fascia: No [name], [name] ([id number]) Tendon: No Muscle: No Joint: No Bone: No Epithelialization: Small (1-33%) N/A N/A Treatment Notes Electronic Signature(s) Signed: 08/02/2018 4:43:58 PM By: Montey Hora Entered By: Montey Hora on 08/02/2018 13:29:58 [name], [name] ([id number]) -------------------------------------------------------------------------------- Multi-Disciplinary Care Plan Details Patient Name: KOHNER, ORLICK. Date of Service: 08/02/2018 1:00 PM Medical Record Number: [id number] Patient Account Number: [id number] Date of Birth/Sex: 03/30/1950 (68 y.o. M) Treating RN: Montey Hora Primary Care Stpehanie Montroy: Benita Stabile Other Clinician: Referring Amdrew Oboyle: Benita Stabile Treating Ainara Eldridge/Extender: Beverly Gust in Treatment: 6 Active Inactive Necrotic Tissue Nursing Diagnoses: Impaired tissue integrity related to necrotic/devitalized tissue Goals: Necrotic/devitalized tissue will be minimized in the wound bed Date Initiated: 06/21/2018 Target Resolution Date: 06/21/2018 Goal Status: Active Patient/caregiver will verbalize understanding of reason and process for debridement of necrotic tissue Date Initiated: 06/21/2018 Target Resolution Date: 06/21/2018 Goal  Status: Active Interventions: Assess patient pain level pre-, during and post procedure and prior to discharge Treatment Activities: Apply topical anesthetic as ordered : 06/21/2018 Notes: Orientation to the Wound Care Program Nursing Diagnoses: Knowledge deficit related to the wound healing center program Goals: Patient/caregiver will verbalize understanding of the [facility] Program Date Initiated: 06/21/2018 Target Resolution Date: 06/21/2018 Goal Status: Active Interventions: Provide education on orientation to the wound center Notes: Pressure Nursing Diagnoses: Knowledge deficit related to management of pressures ulcers Goals: LAVAUGHN, HABERLE ([id number]) Patient will remain free from development of additional pressure ulcers Date Initiated: 06/21/2018 Target Resolution Date: 06/29/2018 Goal Status: Active Interventions: Assess: immobility, friction, shearing, incontinence upon admission and as needed Provide education on pressure ulcers Notes: Wound/Skin Impairment Nursing Diagnoses: Impaired tissue integrity Goals: Ulcer/skin breakdown will have a volume reduction of 30% by week 4 Date Initiated: 06/21/2018 Target Resolution Date: 07/21/2018 Goal Status: Active Interventions: Assess patient/caregiver ability to perform ulcer/skin care regimen upon admission and as needed Assess ulceration(s) every visit Treatment Activities: Referred to DME Patsie Mccardle for dressing supplies : 06/21/2018 Skin care regimen initiated : 06/21/2018 Topical wound management initiated : 06/21/2018 Notes: Electronic Signature(s) Signed: 08/02/2018 4:43:58 PM By: Montey Hora Entered By: Montey Hora on 08/02/2018 13:29:43 [name], [name] ([id number]) -------------------------------------------------------------------------------- Pain Assessment Details Patient Name: [name] Jewett. Date of Service: 08/02/2018 1:00 PM Medical Record Number: [id number] Patient Account Number:  [id number] Date of Birth/Sex: Jul 15, 1950 (68 y.o. M) Treating RN: Army Melia Primary Care Evyn Kooyman: Benita Stabile Other Clinician: Referring Lucerito Rosinski: Benita Stabile Treating Lovis More/Extender: Beverly Gust in Treatment: 6 Active Problems Location of Pain Severity and Description of Pain Patient Has Paino No Site Locations Pain Management and Medication Current Pain Management: Electronic Signature(s) Signed: 08/02/2018 3:04:02 PM By: Army Melia Entered By: Army Melia on 08/02/2018 13:04:54 [name], [name] ([id number]) -------------------------------------------------------------------------------- Patient/Caregiver Education Details Patient Name: [name] Jewett Date of Service: 08/02/2018 1:00 PM Medical Record Number: [id number] Patient Account Number: [id number] Date of Birth/Gender: Aug 26, 1950 (68 y.o. M) Treating RN: Montey Hora Primary Care Physician: Benita Stabile Other Clinician: Referring Physician: Benita Stabile Treating Physician/Extender: Beverly Gust in Treatment: 6 Education Assessment Education Provided To: Patient Education Topics Provided Wound/Skin Impairment: Handouts: Other: wound care as ordered Methods: Demonstration, Explain/Verbal Responses: State content correctly Electronic Signature(s) Signed: 08/02/2018 4:43:58 PM By: Montey Hora Entered By: Montey Hora on 08/02/2018 13:31:46 [place]ild[place], [name] ([id number]) -------------------------------------------------------------------------------- Wound Assessment Details Patient Name: [name] Jewett. Date of  Service: 08/02/2018 1:00 PM Medical Record Number: [id number] Patient Account Number: [id number] Date of Birth/Sex: 1950/07/22 (68 y.o. M) Treating RN: Army Melia Primary Care Siena Poehler: Benita Stabile Other Clinician: Referring Alexiz Sustaita: Benita Stabile Treating Zia Najera/Extender: Beverly Gust in Treatment: 6 Wound Status Wound Number: 4 Primary Etiology: Diabetic  Wound/Ulcer of the Lower Extremity Wound Location: Right Foot - Plantar Secondary Pressure Ulcer Wounding Event: Pressure Injury Etiology: Date Acquired: 06/13/2018 Wound Status: Open Weeks Of Treatment: 6 Comorbid Arrhythmia, Hypertension, Type II Diabetes, Clustered Wound: No History: Osteoarthritis Photos Wound Measurements Length: (cm) 0.8 % Reduction Width: (cm) 0.9 % Reduction Depth: (cm) 0.3 Epitheliali Area: (cm) 0.565 Tunneling: Volume: (cm) 0.17 Underminin in Area: 88% in Volume: 97% zation: Small (1-33%) No g: No Wound Description Classification: Grade 2 Foul Odor Wound Margin: Indistinct, nonvisible Slough/Fib Exudate Amount: Small Exudate Type: Sanguinous Exudate Color: red After Cleansing: No rino No Wound Bed Granulation Amount: Small (1-33%) Exposed Structure Granulation Quality: Red Fascia Exposed: No Necrotic Amount: Medium (34-66%) Fat Layer (Subcutaneous Tissue) Exposed: Yes Necrotic Quality: Eschar, Adherent Slough Tendon Exposed: No Muscle Exposed: No Joint Exposed: No Bone Exposed: No Treatment Notes Longbottom, Esaul J. ([id number]) Wound #4 (Right, Plantar Foot) Notes silvercel, foam and telfa island dressing Electronic Signature(s) Signed: 08/02/2018 3:04:02 PM By: Army Melia Entered By: Army Melia on 08/02/2018 13:08:29 [place], [name] ([id number]) -------------------------------------------------------------------------------- Vitals Details Patient Name: [name] Jewett. Date of Service: 08/02/2018 1:00 PM Medical Record Number: [id number] Patient Account Number: [id number] Date of Birth/Sex: November 20, 1950 (68 y.o. M) Treating RN: Army Melia Primary Care Cecilie Heidel: Benita Stabile Other Clinician: Referring Sharena Dibenedetto: Benita Stabile Treating Herman Mell/Extender: Beverly Gust in Treatment: 6 Vital Signs Time Taken: 13:04 Temperature (F): 98.3 Height (in): 74 Pulse (bpm): 58 Weight (lbs): 233 Respiratory Rate (breaths/min):  16 Body Mass Index (BMI): 29.9 Blood Pressure (mmHg): 131/87 Reference Range: 80 - 120 mg / dl Electronic Signature(s) Signed: 08/02/2018 3:04:02 PM By: Army Melia Entered By: Army Melia on 08/02/2018 13:05:09

## 2018-08-04 NOTE — Progress Notes (Signed)
   HPI: 68 year old male presenting today, referred by Dr. Prudence Davidson, with a chief complaint of a hammertoe repair of the 2nd toe of the right foot that has been present for the past for the past few years. He denies any pain and has not had any treatment. There are no modifying factors noted. Patient is here for further evaluation and treatment.   Past Medical History:  Diagnosis Date  . AF (atrial fibrillation) ([place])   . Cardiomyopathy ([place])   . CHF (congestive heart failure) ([place])   . Diabetes mellitus without complication ([place])   . Hypertension   . Serum lipids high       Objective: Physical Exam General: The patient is alert and oriented x3 in no acute distress.  Dermatology: Wound #1 noted to the right sub-second MPJ measuring 1.5 x 1.5 x 0.3 cm.   To the above-noted ulceration, there is no eschar. There is a moderate amount of slough, fibrin and necrotic tissue. Granulation tissue and wound base is red. There is no malodor. There is a minimal amount of serosanginous drainage noted. Periwound integrity is intact.  Skin is cool, dry and supple bilateral lower extremities.   Vascular: Palpable pedal pulses bilaterally. No edema or erythema noted. Capillary refill within normal limits.  Neurological: Epicritic and protective threshold grossly intact bilaterally.   Musculoskeletal Exam: All pedal and ankle joints range of motion within normal limits bilateral. Muscle strength 5/5 in all groups bilateral. Hammertoe contracture deformity noted to the 2nd digit of the right foot.  Radiographic Exam: Hammertoe contracture deformity noted to the interphalangeal joints and MPJ of the respective hammertoe digits mentioned on clinical musculoskeletal exam.     Assessment: 1. 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 Patient evaluated. X-Rays reviewed.  2. Medically necessary excisional  debridement including subcutaneous tissue was performed using a tissue nipper and a chisel blade. Excisional debridement of all the necrotic nonviable tissue down to healthy bleeding viable tissue was performed with post-debridement measurements same as pre-. 3. The wound was cleansed and dry sterile dressing applied. 4. Continue management with [place].  5. Patient may need surgery on his hammertoe to alleviate recurrent ulcer but I would like for current wound to heal first.  6. Return to clinic in 4 week for possible surgical consult.     Edrick Kins, DPM [facility]  Dr. Edrick Kins, DPM    [address], [place] [address]                Office [phone]  Fax [phone]

## 2018-08-09 ENCOUNTER — Other Ambulatory Visit: Payer: Self-pay

## 2018-08-09 ENCOUNTER — Encounter: Payer: PPO | Admitting: Physician Assistant

## 2018-08-09 DIAGNOSIS — E11621 Type 2 diabetes mellitus with foot ulcer: Secondary | ICD-10-CM | POA: Diagnosis not present

## 2018-08-09 DIAGNOSIS — L97512 Non-pressure chronic ulcer of other part of right foot with fat layer exposed: Secondary | ICD-10-CM | POA: Diagnosis not present

## 2018-08-10 NOTE — Progress Notes (Signed)
[name] [name], [name] [name] ([id number]) Visit Report for 08/09/2018 Arrival Information Details Patient Name: [name], [name]. Date of Service: 08/09/2018 10:15 AM Medical Record Number: [id number] Patient Account Number: [id number] Date of Birth/Sex: [birth year] (68 y.o. M) Treating RN: Montey Hora Primary Care Detria Cummings: Benita Stabile Other Clinician: Referring Dawnielle Christiana: Benita Stabile Treating Khailee Mick/Extender: Melburn Hake, HOYT Weeks in Treatment: 7 Visit Information History Since Last Visit Added or deleted any medications: No Patient Arrived: Ambulatory Any new allergies or adverse reactions: No Arrival Time: 10:09 Had a fall or experienced change in No Accompanied By: self activities of daily living that may affect Transfer Assistance: None risk of falls: Patient Identification Verified: Yes Signs or symptoms of abuse/neglect since last visito No Secondary Verification Process Completed: Yes Hospitalized since last visit: No Implantable device outside of the clinic excluding No cellular tissue based products placed in the center since last visit: Has Dressing in Place as Prescribed: Yes Pain Present Now: No Electronic Signature(s) Signed: 08/09/2018 11:14:01 AM By: Lorine Bears RCP, RRT, CHT Entered By: Lorine Bears on 08/09/2018 10:09:43 [name], [name] [name] ([id number]) -------------------------------------------------------------------------------- Encounter Discharge Information Details Patient Name: [name], [name]. Date of Service: 08/09/2018 10:15 AM Medical Record Number: [id number] Patient Account Number: [id number] Date of Birth/Sex: Jul 12, 1950 (68 y.o. M) Treating RN: Montey Hora Primary Care Travell Desaulniers: Benita Stabile Other Clinician: Referring Miriam Liles: [name], [name] [place] Treating Nattie Lazenby/Extender: Melburn Hake, HOYT Weeks in Treatment: 7 Encounter Discharge Information Items Post Procedure Vitals Discharge Condition: Stable Temperature (F):  98.0 Ambulatory Status: Ambulatory Pulse (bpm): 92 Discharge Destination: Home Respiratory Rate (breaths/min): 16 Transportation: Private Auto Blood Pressure (mmHg): 127/72 Accompanied By: self Schedule Follow-up Appointment: Yes Clinical Summary of Care: Electronic Signature(s) Signed: 08/09/2018 4:59:24 PM By: Montey Hora Entered By: Montey Hora on 08/09/2018 10:42:19 [name], [name] [name] ([id number]) -------------------------------------------------------------------------------- Lower Extremity Assessment Details Patient Name: [name] [name]. Date of Service: 08/09/2018 10:15 AM Medical Record Number: [id number] Patient Account Number: [id number] Date of Birth/Sex: 11-11-1950 (68 y.o. M) Treating RN: Cornell Barman Primary Care Millette Halberstam: Benita Stabile Other Clinician: Referring Jolly Carlini: Benita Stabile Treating Moiz Ryant/Extender: Worthy Keeler Weeks in Treatment: 7 Vascular Assessment Pulses: Dorsalis Pedis Palpable: [Right:Yes] Electronic Signature(s) Signed: 08/09/2018 5:26:03 PM By: Gretta Cool, BSN, RN, CWS, Kim RN, BSN Entered By: Gretta Cool, BSN, RN, CWS, Kim on 08/09/2018 10:19:59 [name], [name] [name] ([id number]) -------------------------------------------------------------------------------- Multi Wound Chart Details Patient Name: [name], [name]. Date of Service: 08/09/2018 10:15 AM Medical Record Number: [id number] Patient Account Number: [id number] Date of Birth/Sex: Mar 11, 1950 (68 y.o. M) Treating RN: Montey Hora Primary Care Kayshaun Polanco: Benita Stabile Other Clinician: Referring Lukka Black: Benita Stabile Treating Montana Fassnacht/Extender: STONE III, HOYT Weeks in Treatment: 7 Vital Signs Height(in): 74 Pulse(bpm): 92 Weight(lbs): 233 Blood Pressure(mmHg): 127/72 Body Mass Index(BMI): 30 Temperature(F): 98.0 Respiratory Rate 16 (breaths/min): Photos: [N/A:N/A] Wound Location: Right Foot - Plantar N/A N/A Wounding Event: Pressure Injury N/A N/A Primary Etiology: Diabetic Wound/Ulcer  of the N/A N/A Lower Extremity Secondary Etiology: Pressure Ulcer N/A N/A Comorbid History: Arrhythmia, Hypertension, N/A N/A Type II Diabetes, Osteoarthritis Date Acquired: 06/13/2018 N/A N/A Weeks of Treatment: 7 N/A N/A Wound Status: Open N/A N/A Measurements L x W x D 0.5x0.3x0.6 N/A N/A (cm) Area (cm) : 0.118 N/A N/A Volume (cm) : 0.071 N/A N/A % Reduction in Area: 97.50% N/A N/A % Reduction in Volume: 98.70% N/A N/A Starting Position 1 2 (o'clock): Ending Position 1 10 (o'clock): Maximum Distance 1 (cm): 0.5 Undermining: Yes N/A N/A Classification: Grade 2 N/A N/A Exudate Amount: Medium N/A N/A Exudate Type:  Serosanguineous N/A N/A Exudate Color: red, brown N/A N/A Wound Margin: Indistinct, nonvisible N/A N/A Granulation Amount: Small (1-33%) N/A N/A [name], [name] ([id number]) Granulation Quality: Red N/A N/A Necrotic Amount: Medium (34-66%) N/A N/A Necrotic Tissue: Eschar, Adherent Slough N/A N/A Exposed Structures: Fat Layer (Subcutaneous N/A N/A Tissue) Exposed: Yes Fascia: No Tendon: No Muscle: No Joint: No Bone: No Epithelialization: Small (1-33%) N/A N/A Assessment Notes: Callus surrounding wound N/A N/A area. Treatment Notes Electronic Signature(s) Signed: 08/09/2018 4:59:24 PM By: Montey Hora Entered By: Montey Hora on 08/09/2018 10:32:09 [name], [name] [name] ([id number]) -------------------------------------------------------------------------------- Multi-Disciplinary Care Plan Details Patient Name: [name], [name]. Date of Service: 08/09/2018 10:15 AM Medical Record Number: [id number] Patient Account Number: [id number] Date of Birth/Sex: [birth year] (68 y.o. M) Treating RN: Montey Hora Primary Care Jomes Giraldo: Benita Stabile Other Clinician: Referring Demontae Antunes: Benita Stabile Treating Marvon Shillingburg/Extender: Melburn Hake, HOYT Weeks in Treatment: 7 Active Inactive Necrotic Tissue Nursing Diagnoses: Impaired tissue integrity related to  necrotic/devitalized tissue Goals: Necrotic/devitalized tissue will be minimized in the wound bed Date Initiated: 06/21/2018 Target Resolution Date: 06/21/2018 Goal Status: Active Patient/caregiver will verbalize understanding of reason and process for debridement of necrotic tissue Date Initiated: 06/21/2018 Target Resolution Date: 06/21/2018 Goal Status: Active Interventions: Assess patient pain level pre-, during and post procedure and prior to discharge Treatment Activities: Apply topical anesthetic as ordered : 06/21/2018 Notes: Orientation to the Wound Care Program Nursing Diagnoses: Knowledge deficit related to the wound healing center program Goals: Patient/caregiver will verbalize understanding of the [facility] Date Initiated: 06/21/2018 Target Resolution Date: 06/21/2018 Goal Status: Active Interventions: Provide education on orientation to the wound center Notes: Pressure Nursing Diagnoses: Knowledge deficit related to management of pressures ulcers Goals: HANZ, [name] ([id number]) Patient will remain free from development of additional pressure ulcers Date Initiated: 06/21/2018 Target Resolution Date: 06/29/2018 Goal Status: Active Interventions: Assess: immobility, friction, shearing, incontinence upon admission and as needed Provide education on pressure ulcers Notes: Wound/Skin Impairment Nursing Diagnoses: Impaired tissue integrity Goals: Ulcer/skin breakdown will have a volume reduction of 30% by week 4 Date Initiated: 06/21/2018 Target Resolution Date: 07/21/2018 Goal Status: Active Interventions: Assess patient/caregiver ability to perform ulcer/skin care regimen upon admission and as needed Assess ulceration(s) every visit Treatment Activities: Referred to DME Tramayne Sebesta for dressing supplies : 06/21/2018 Skin care regimen initiated : 06/21/2018 Topical wound management initiated : 06/21/2018 Notes: Electronic Signature(s) Signed: 08/09/2018 4:59:24  PM By: Montey Hora Entered By: Montey Hora on 08/09/2018 10:31:43 [name], [name] [name] ([id number]) -------------------------------------------------------------------------------- Pain Assessment Details Patient Name: [name] [name]. Date of Service: 08/09/2018 10:15 AM Medical Record Number: [id number] Patient Account Number: [id number] Date of Birth/Sex: [birth year] (68 y.o. M) Treating RN: Montey Hora Primary Care Angeles Zehner: Benita Stabile Other Clinician: Referring Sparkle Aube: Benita Stabile Treating Cheyan Frees/Extender: Melburn Hake, HOYT Weeks in Treatment: 7 Active Problems Location of Pain Severity and Description of Pain Patient Has Paino No Site Locations Pain Management and Medication Current Pain Management: Electronic Signature(s) Signed: 08/09/2018 11:14:01 AM By: Paulla Fore, RRT, CHT Signed: 08/09/2018 4:59:24 PM By: Montey Hora Entered By: Lorine Bears on 08/09/2018 10:09:51 [name], [name] [name] ([id number]) -------------------------------------------------------------------------------- Patient/Caregiver Education Details Patient Name: [name], [name]. Date of Service: 08/09/2018 10:15 AM Medical Record Number: [id number] Patient Account Number: [id number] Date of Birth/Gender: Aug 11, 1950 (69 y.o. M) Treating RN: Montey Hora Primary Care Physician: Benita Stabile Other Clinician: Referring Physician: Benita Stabile Treating Physician/Extender: Sharalyn Ink in Treatment: 7 Education Assessment Education Provided To: Patient Education Topics Provided  Offloading: Handouts: Other: offloading Methods: Explain/Verbal Responses: State content correctly Electronic Signature(s) Signed: 08/09/2018 4:59:24 PM By: Montey Hora Entered By: Montey Hora on 08/09/2018 10:40:38 Fillinger, [name] [name] ([id number]) -------------------------------------------------------------------------------- Wound Assessment Details Patient Name: [name] [name]. Date of Service: 08/09/2018 10:15 AM Medical Record Number: [id number] Patient Account Number: [id number] Date of Birth/Sex: [birth year] (68 y.o. M) Treating RN: Cornell Barman Primary Care Ansen Sayegh: Benita Stabile Other Clinician: Referring Rajveer Handler: Benita Stabile Treating Jersi Mcmaster/Extender: Melburn Hake, HOYT Weeks in Treatment: 7 Wound Status Wound Number: 4 Primary Etiology: Diabetic Wound/Ulcer of the Lower Extremity Wound Location: Right Foot - Plantar Secondary Pressure Ulcer Wounding Event: Pressure Injury Etiology: Date Acquired: 06/13/2018 Wound Status: Open Weeks Of Treatment: 7 Comorbid Arrhythmia, Hypertension, Type II Diabetes, Clustered Wound: No History: Osteoarthritis Photos Wound Measurements Length: (cm) 0.5 % Reductio Width: (cm) 0.3 % Reductio Depth: (cm) 0.6 Epithelial Area: (cm) 0.118 Tunneling Volume: (cm) 0.071 Undermini Startin Ending Maximum n in Area: 97.5% n in Volume: 98.7% ization: Small (1-33%) : No ng: Yes g Position (o'clock): 2 Position (o'clock): 10 Distance: (cm) 0.5 Wound Description Classification: Grade 2 Foul Odor Wound Margin: Indistinct, nonvisible Slough/Fi Exudate Amount: Medium Exudate Type: Serosanguineous Exudate Color: red, brown After Cleansing: No brino No Wound Bed Granulation Amount: Small (1-33%) Exposed Structure Granulation Quality: Red Fascia Exposed: No Necrotic Amount: Medium (34-66%) Fat Layer (Subcutaneous Tissue) Exposed: Yes Necrotic Quality: Eschar, Adherent Slough Tendon Exposed: No Muscle Exposed: No [name], [name] ([id number]) Joint Exposed: No Bone Exposed: No Assessment Notes Callus surrounding wound area. Treatment Notes Wound #4 (Right, Plantar Foot) Notes silvercel, telfa island dressing Electronic Signature(s) Signed: 08/09/2018 5:26:03 PM By: Gretta Cool, BSN, RN, CWS, Kim RN, BSN Entered By: Gretta Cool, BSN, RN, CWS, Kim on 08/09/2018 10:20:57 Ollinger, [name] [name]  ([id number]) -------------------------------------------------------------------------------- Vitals Details Patient Name: [name] [name] Date of Service: 08/09/2018 10:15 AM Medical Record Number: [id number] Patient Account Number: [id number] Date of Birth/Sex: 09-11-1950 (68 y.o. M) Treating RN: Montey Hora Primary Care Donice Alperin: Benita Stabile Other Clinician: Referring Mollie Rossano: [name], [name] [place] Treating Clea Dubach/Extender: STONE III, HOYT Weeks in Treatment: 7 Vital Signs Time Taken: 10:09 Temperature (F): 98.0 Height (in): 74 Pulse (bpm): 92 Weight (lbs): 233 Respiratory Rate (breaths/min): 16 Body Mass Index (BMI): 29.9 Blood Pressure (mmHg): 127/72 Reference Range: 80 - 120 mg / dl Electronic Signature(s) Signed: 08/09/2018 11:14:01 AM By: Lorine Bears RCP, RRT, CHT Entered By: Lorine Bears on 08/09/2018 10:13:08

## 2018-08-12 NOTE — Progress Notes (Signed)
PRAKASH, KIMBERLING ([id number]) Visit Report for 08/09/2018 Chief Complaint Document Details Patient Name: [name], [name]. Date of Service: 08/09/2018 10:15 AM Medical Record Number: [id number] Patient Account Number: [id number] Date of Birth/Sex: [birth year] (68 y.o. M) Treating RN: Montey Hora Primary Care Provider: Benita Stabile Other Clinician: Referring Provider: Benita Stabile Treating Provider/Extender: Melburn Hake, HOYT Weeks in Treatment: 7 Information Obtained from: Patient Chief Complaint Right plantar foot ulcer Electronic Signature(s) Signed: 08/09/2018 6:14:16 PM By: Worthy Keeler PA-C Entered By: Worthy Keeler on 08/09/2018 10:14:26 Kight, [name] ([id number]) -------------------------------------------------------------------------------- Debridement Details Patient Name: [name] Date of Service: 08/09/2018 10:15 AM Medical Record Number: [id number] Patient Account Number: [id number] Date of Birth/Sex: 1950-02-24 (68 y.o. M) Treating RN: Montey Hora Primary Care Provider: Benita Stabile Other Clinician: Referring Provider: Benita Stabile Treating Provider/Extender: STONE III, HOYT Weeks in Treatment: 7 Debridement Performed for Wound #4 Right,Plantar Foot Assessment: Performed By: Physician STONE III, HOYT E., PA-C Debridement Type: Debridement Severity of Tissue Pre Fat layer exposed Debridement: Level of Consciousness (Pre- Awake and Alert procedure): Pre-procedure Verification/Time Yes - 10:32 Out Taken: Start Time: 10:32 Pain Control: Lidocaine 4% Topical Solution Total Area Debrided (L x W): 2.5 (cm) x 1.8 (cm) = 4.5 (cm) Tissue and other material Viable, Non-Viable, Callus, Slough, Subcutaneous, Slough debrided: Level: Skin/Subcutaneous Tissue Debridement Description: Excisional Instrument: Curette Bleeding: Minimum Hemostasis Achieved: Pressure End Time: 10:37 Procedural Pain: 0 Post Procedural Pain: 0 Response to Treatment: Procedure was  tolerated well Level of Consciousness Awake and Alert (Post-procedure): Post Debridement Measurements of Total Wound Length: (cm) 0.8 Width: (cm) 0.8 Depth: (cm) 0.2 Volume: (cm) 0.101 Character of Wound/Ulcer Post Debridement: Improved Severity of Tissue Post Debridement: Fat layer exposed Post Procedure Diagnosis Same as Pre-procedure Electronic Signature(s) Signed: 08/09/2018 4:59:24 PM By: Montey Hora Signed: 08/09/2018 6:14:16 PM By: Worthy Keeler PA-C Entered By: Montey Hora on 08/09/2018 10:38:44 Bishara, [name] ([id number]) -------------------------------------------------------------------------------- HPI Details Patient Name: [name] Date of Service: 08/09/2018 10:15 AM Medical Record Number: [id number] Patient Account Number: [id number] Date of Birth/Sex: [birth year] (68 y.o. M) Treating RN: Montey Hora Primary Care Provider: Benita Stabile Other Clinician: Referring Provider: Benita Stabile Treating Provider/Extender: STONE III, HOYT Weeks in Treatment: 7 History of Present Illness Associated Signs and Symptoms: Patient has a history of diabetes mellitus type II, hypertension, and atrial fibrillation. He also has a hammertoe deformity of the bilateral feet which seems to be causing pressure in the ball of his foot. HPI Description: 03/09/17 on evaluation today patient presents for his initial evaluation concerning an ulcer on the plantar aspect of his right foot which has been open he tells me for about three weeks. He has been seen at [facility] where they did perform debridement it appears according to a note on 02/22/17 where unfortunately it appears that his callous area began to break down into an ulcer after modifications have been added to his insulin. This had happened previously as well. However I do not have details of the severity of debridement at that point although it does not sound as if you the debridement was too significant based on  what the patient is telling me that he still had a lot of callous following debridement. With that being said they were going to look into altering his insoles to try to prevent further breakdown in the future. Nonetheless he has had foul odor discharge coming from the ulcer which is noted all the way back to that visit on 02/22/17  [facility]. Patient states that this was concerning him more than anything else. He does have diabetes although he described this as "borderline diabetes" he is on metoprolol however along with lisinopril. Patient is not having any issues with pain in regard to his right plantar foot. 03/16/17 on evaluation today patient appears to be doing much better in regard to his plantar foot ulcer. He actually tells me that he loves the peg assist offloading shoe and that he hasn't had any pain in the ulcer area from the callous since I worked on it last week. Overall he is extremely happy with how things have progressed. Likewise the wound bed has no slough noted there is no evidence of infection and it looks excellent. I did receive the results of his hemoglobin A1c which showed a value of 5.9 which was elevated but actually rather well. Subsequently I did also receive the x-ray of his foot which showed diffuse degenerative change but no underlying acute bony abnormality. 03/23/17 on evaluation today patient appears to be doing better in regard to his right plantar foot ulcer. He continues to show signs of improvement there's definitely not as much drainage at this point. He has been tolerating the dressing changes without complication he is not happy with the peg assist offloading shoe but at the same time I do believe that it is making good progress as far as offloading is concerned. I still believe he may need to talk to a surgeon about surgically correcting a hammertoe in order to avoid additional pressure to the site especially since he Artie has diabetic shoes and they  do not seem to have prevented callous buildup in ulcer formation. 03/30/17 on evaluation today patient appears to be doing better in regard to his right plantar foot ulcer. Continues to show signs of improvement which is good news. Unfortunately we were unable to get the appointment with the orthopedic, Dr. Doran Durand, whom I recommended for him due to patient having a balance at the practice. With that being said patient's foot ulcer does seem to be doing much better on evaluation today he still has some depth to the wound but overall we are seeing improvement and epithelialization week by week. Hopefully this is something that will close shortly. 04/05/17 on evaluation today patient unfortunately has what appears to be an area of the plantar surface of his ulcer where he had fluid collection where the callous grew over top of the wound bed. Unfortunately there was some pus like material noted during cleansing that we did sent for culture today. Hopefully this is not truly an infection is or does not appear to be any evidence of erythema surrounding and maybe this is just simply a small setback with a fluid collection that has caused this issue. Nonetheless we will see what that shows when we get the culture back. I am also going to have it completely antibiotic which I previously placed him on anyway which will hopefully prevent any true infection from setting in. 04/12/17 on evaluation today patient's ulcer on the plantar aspect of his right foot actually appears to be better then during last weeks evaluation. In general he has been tolerating the dressing changes in utilizing the offloading shoe which does seem to be beneficial for him. With that being said he still seems to get some pressure to the area just not nearly as much as he previously had noted. Again fortunately there is no significant pain. PRAKASH, KIMBERLING ([id number]) 04/19/17 on  evaluation today patient appears to be doing excellent in  regard to his right foot ulcer. He has been tolerating the dressing changes without complication. There really has not been any drainage over the past few days he tells me. With that being said he seems to be doing excellent in my opinion at this point he does have some callous buildup. 04/26/17 On evaluation today patient's wound appears to be completely healed which is great news. He has been tolerating the dressing changes without complication and I do think he has done very well in regard to the healing process and offloading he has listed everything that that we instructed him to do. Obviously this appears to have paid off and he has progressed very nicely. Readmission: 07/05/17 on evaluation today patient is seen for fault evaluation and our clinic concerning the same issue that I have previously treated him for ending back in April 2019. This is a callous region with subsequently an ulcer underneath this on the plantar aspect of his right second metatarsal region. Fortunately he's not having any significant discomfort although his girlfriend told him that she noted an area of bruising at the site underneath the callous and wanted him to come get this checked out. He was very please with our care previous and therefore was more than happy to come let us check this for him. Upon inspection initially he did have significant callous overlying the area in question. It was not easily identifiable as far as any ulcer was concerned. With that being said he with this significant callous did require some sharp debridement and following debridement there did appear to be a small ulcer underlying. Fortunately patient in general shows no signs of infection at this point. He again has neuropathy and therefore does not have any ongoing discomfort or pain at this point. 07/12/17 on evaluation today patient actually appears to be doing very well in regard to his plantar foot ulcer. In fact this appears to be  completely healed and there's no residual opening at this time. Overall I'm very pleased in this regard. Patient also is extremely pleased. Readmission: 02/08/18 patient presents today for follow-up evaluation he has previously been seen here in our office for the same issue. Fortunately there is not been any evidence of infection since I last saw him he does note that he is been going to see his local podiatrist for callous pairing. During that time they actually noted that he had an open wound and subsequently referred him back ties for evaluation of this wound. He has done very well in the past with good wound care often healing in just a few weeks. No fevers, chills, nausea, or vomiting noted at this time. 02/15/18 on evaluation today patient actually appears to be doing very well in regard to his plantar foot ulcer. He is been tolerating the dressing changes without complication. There does appear to be some eschar of the line the wound surface. Fortunately I am happy with how things are progressing from the standpoint of the wound progress from last week to this week. 02/22/18 on evaluation today patient appears to be doing very well in regard to his plantar foot ulcer. He did have callous buildup around the area that required some sharp debridement today. He tolerated the debridement without complication. Post debridement the wound bed appears to be doing much better there's just a very tiny opening still remaining. 03/01/18 on evaluation today patient appears to be doing excellent in regard to his plantar  foot ulcer. In fact after I debrided away what appeared to be a dried blood clot on the surface and actually appear there was just a very small opening remaining in the central portion of the wound. This is literally a pinpoint region. Overall he has done well and I'm very pleased in this regard. I think that likely one more week will see this area completely healed. 03/08/18 on evaluation  today patient appears to actually be doing very well in regard to his plantar foot ulcer. He has been tolerating the dressing changes without complication. Fortunately this appears to be completely healed at this point which is excellent news. Overall I'm extremely happy with how things have progressed. 06/21/18 on evaluation today patient presents for evaluation in our clinic concerning issues that he's having with you seeing the area of his right foot that is previously experienced issues with. Fortunately there does not appear to be any evidence of infection at this time which is good news. With that being said he tells me that he has noted the callous buildup of the past several months but it was only in the past roughly week that he has noted drainage from the callous. He's not having any significant discomfort other than the fact that he states walking on the area with the extensive callous seems to be an issue. 06/28/18 on evaluation today patient actually appears to be doing very well in regard to his plantar foot ulcer. He's been tolerating the dressing changes without complication. There is some callous buildup though not nearly as much as noted last [name], [name]. ([id number]) week. Subsequently there is some need for sharp debridement today. 07/05/18 on evaluation today patient appears to be doing much better in regard to his plantar foot ulcer. This is not completely healed yet but it is thinking this morning and appears better than last week's evaluation. He has been tolerating the dressing changes without complication. 07/12/18 on evaluation today patient's foot ulcer actually appears to be showing some progress but again is not completely healed at this point. He is gonna require some sharp debridement today unfortunately. 07/18/18 on evaluation today patient appears to be doing about the same in regard to the plantar foot wound. Fortunately there's no signs of active infection.  Unfortunately he hasn't made quite as much progress as I'd like to have seen. I do believe that he is doing better than obviously when he started but we still have some work to do. I think a total contact cast may be something to consider if he does not make a lot of significant improvement over the next week. With that being said the one issue is gonna be that he is done with a wound on his right foot which is going to cause some issues with driving if he has to have a cast the need to have somebody drive him. He's not sure that even has this capability. 07/26/18 on evaluation today patient appears to be doing better in regard to his plantar foot ulcer. He does have a little bit of a blood blister I believe that collagen is actually drying out and then getting stuck to the wound causing [place] to collect fluid underneath ugly this may be counterproductive. He states that he felt like he was actually doing somewhat better when he was utilizing the alginate as opposed to the collagen. 08/02/18-Patient returns at 1 week for evaluation of right plantar foot wound. He was just at the podiatry office where they removed  some of the surrounding callus adjacent to the wound. The wound appears to be doing well and we are using silver cell at this time. 08/09/18 on evaluation today patient appears to be doing really about the same in regard to his foot ulcer. He's been tolerating the dressing changes without complication with that being said I really do not see any significant improvements and again I previously discussed with him that I do believe we need to get a total contact cast on his foot in order to get this to heal so that he doesn't have any further complications. He states that if things are not doing better by next week he's in agreement this plan. With that being said he will have have someone drive him as again this is his right foot. He also has spoken with Dr. Amalia Hailey regarding surgery to correct the  deformity of his foot which is causing the issue currently. Subsequently Dr. Amalia Hailey is going to consider reconstructive surgery but has to wait until the ulcer heals another good reason to get this healed as quickly as possible. Electronic Signature(s) Signed: 08/09/2018 6:14:16 PM By: Worthy Keeler PA-C Entered By: Worthy Keeler on 08/09/2018 18:05:49 Ade, [name] ([id number]) -------------------------------------------------------------------------------- Physical Exam Details Patient Name: TEODOR, PRATER. Date of Service: 08/09/2018 10:15 AM Medical Record Number: [id number] Patient Account Number: [id number] Date of Birth/Sex: 11-28-1950 (68 y.o. M) Treating RN: Montey Hora Primary Care Provider: Benita Stabile Other Clinician: Referring Provider: TATE, Sharlet Salina Treating Provider/Extender: STONE III, HOYT Weeks in Treatment: 7 Constitutional Well-nourished and well-hydrated in no acute distress. Respiratory normal breathing without difficulty. clear to auscultation bilaterally. Cardiovascular regular rate and rhythm with normal S1, S2. Psychiatric this patient is able to make decisions and demonstrates good insight into disease process. Alert and Oriented x 3. pleasant and cooperative. Notes My suggestion at this point based on what I was seeing was that we go ahead and perform sharp debridement to clear away some of the necrotic tissue from the surface of the wound. He tolerated this without complication post debridement wound bed appears to be doing better I did have to utilize a little bit of silver nitrate to complete cauterize the wound to prevent bleeding. Electronic Signature(s) Signed: 08/09/2018 6:14:16 PM By: Worthy Keeler PA-C Entered By: Worthy Keeler on 08/09/2018 18:06:23 Marchena, [name] ([id number]) -------------------------------------------------------------------------------- Physician Orders Details Patient Name: SQUIRE, WITHEY. Date of Service:  08/09/2018 10:15 AM Medical Record Number: [id number] Patient Account Number: [id number] Date of Birth/Sex: 06/08/1950 (68 y.o. M) Treating RN: Montey Hora Primary Care Provider: Benita Stabile Other Clinician: Referring Provider: Benita Stabile Treating Provider/Extender: Melburn Hake, HOYT Weeks in Treatment: 7 Verbal / Phone Orders: No Diagnosis Coding ICD-10 Coding Code Description E11.621 Type 2 diabetes mellitus with foot ulcer L97.512 Non-pressure chronic ulcer of other part of right foot with fat layer exposed I10 Essential (primary) hypertension I48.20 Chronic atrial fibrillation, unspecified Wound Cleansing Wound #4 Right,Plantar Foot o Clean wound with Normal Saline. o Dial antibacterial soap, wash wounds, rinse and pat dry prior to dressing wounds o May Shower, gently pat wound dry prior to applying new dressing. Anesthetic (add to Medication List) Wound #4 Right,Plantar Foot o Topical Lidocaine 4% cream applied to wound bed prior to debridement (In Clinic Only). Primary Wound Dressing Wound #4 Right,Plantar Foot o Silver Alginate Secondary Dressing Wound #4 [place] Dressing Change Frequency Wound #4 Right,Plantar Foot o Change dressing every other day. Follow-up Appointments Wound #4 Right,Plantar  Foot o Return Appointment in 1 week. Edema Control Wound #4 Right,Plantar Foot o Elevate legs to the level of the heart and pump ankles as often as possible Off-Loading Wound #4 Right,Plantar Foot o Open toe surgical shoe with peg assist. - right Marshman, Elison J. ([id number]) Additional Orders / Instructions Wound #4 Right,Plantar Foot o Increase protein intake. Electronic Signature(s) Signed: 08/09/2018 4:59:24 PM By: Montey Hora Signed: 08/09/2018 6:14:16 PM By: Worthy Keeler PA-C Entered By: Montey Hora on 08/09/2018 10:40:08 Ost, [name]  ([id number]) -------------------------------------------------------------------------------- Problem List Details Patient Name: ANIKIN, PROSSER. Date of Service: 08/09/2018 10:15 AM Medical Record Number: [id number] Patient Account Number: [id number] Date of Birth/Sex: 10-27-1950 (68 y.o. M) Treating RN: Montey Hora Primary Care Provider: Benita Stabile Other Clinician: Referring Provider: Benita Stabile Treating Provider/Extender: Melburn Hake, HOYT Weeks in Treatment: 7 Active Problems ICD-10 Evaluated Encounter Code Description Active Date Today Diagnosis E11.621 Type 2 diabetes mellitus with foot ulcer 06/21/2018 No Yes L97.512 Non-pressure chronic ulcer of other part of right foot with fat 06/21/2018 No Yes layer exposed I10 Essential (primary) hypertension 06/21/2018 No Yes I48.20 Chronic atrial fibrillation, unspecified 06/21/2018 No Yes Inactive Problems Resolved Problems Electronic Signature(s) Signed: 08/09/2018 6:14:16 PM By: Worthy Keeler PA-C Entered By: Worthy Keeler on 08/09/2018 10:14:20 Zamor, [name] ([id number]) -------------------------------------------------------------------------------- Progress Note Details Patient Name: [name] Date of Service: 08/09/2018 10:15 AM Medical Record Number: [id number] Patient Account Number: [id number] Date of Birth/Sex: 1950/12/29 (68 y.o. M) Treating RN: Montey Hora Primary Care Provider: Benita Stabile Other Clinician: Referring Provider: Benita Stabile Treating Provider/Extender: Melburn Hake, HOYT Weeks in Treatment: 7 Subjective Chief Complaint Information obtained from Patient Right plantar foot ulcer History of Present Illness (HPI) The following HPI elements were documented for the patient's wound: Associated Signs and Symptoms: Patient has a history of diabetes mellitus type II, hypertension, and atrial fibrillation. He also has a hammertoe deformity of the bilateral feet which seems to be causing pressure in the ball  of his foot. 03/09/17 on evaluation today patient presents for his initial evaluation concerning an ulcer on the plantar aspect of his right foot which has been open he tells me for about three weeks. He has been seen at [facility] where they did perform debridement it appears according to a note on 02/22/17 where unfortunately it appears that his callous area began to break down into an ulcer after modifications have been added to his insulin. This had happened previously as well. However I do not have details of the severity of debridement at that point although it does not sound as if you the debridement was too significant based on what the patient is telling me that he still had a lot of callous following debridement. With that being said they were going to look into altering his insoles to try to prevent further breakdown in the future. Nonetheless he has had foul odor discharge coming from the ulcer which is noted all the way back to that visit on 02/[facility]. Patient states that this was concerning him more than anything else. He does have diabetes although he described this as "borderline diabetes" he is on metoprolol however along with lisinopril. Patient is not having any issues with pain in regard to his right plantar foot. 03/16/17 on evaluation today patient appears to be doing much better in regard to his plantar foot ulcer. He actually tells me that he loves the peg assist offloading shoe and that he hasn't  had any pain in the ulcer area from the callous since I worked on it last week. Overall he is extremely happy with how things have progressed. Likewise the wound bed has no slough noted there is no evidence of infection and it looks excellent. I did receive the results of his hemoglobin A1c which showed a value of 5.9 which was elevated but actually rather well. Subsequently I did also receive the x-ray of his foot which showed diffuse degenerative change but no  underlying acute bony abnormality. 03/23/17 on evaluation today patient appears to be doing better in regard to his right plantar foot ulcer. He continues to show signs of improvement there's definitely not as much drainage at this point. He has been tolerating the dressing changes without complication he is not happy with the peg assist offloading shoe but at the same time I do believe that it is making good progress as far as offloading is concerned. I still believe he may need to talk to a surgeon about surgically correcting a hammertoe in order to avoid additional pressure to the site especially since he Artie has diabetic shoes and they do not seem to have prevented callous buildup in ulcer formation. 03/30/17 on evaluation today patient appears to be doing better in regard to his right plantar foot ulcer. Continues to show signs of improvement which is good news. Unfortunately we were unable to get the appointment with the orthopedic, Dr. Doran Durand, whom I recommended for him due to patient having a balance at the practice. With that being said patient's foot ulcer does seem to be doing much better on evaluation today he still has some depth to the wound but overall we are seeing improvement and epithelialization week by week. Hopefully this is something that will close shortly. 04/05/17 on evaluation today patient unfortunately has what appears to be an area of the plantar surface of his ulcer where he had fluid collection where the callous grew over top of the wound bed. Unfortunately there was some pus like material noted during cleansing that we did sent for culture today. Hopefully this is not truly an infection is or does not appear to be any ROYDEN, BULMAN. ([id number]) evidence of erythema surrounding and maybe this is just simply a small setback with a fluid collection that has caused this issue. Nonetheless we will see what that shows when we get the culture back. I am also going to have  it completely antibiotic which I previously placed him on anyway which will hopefully prevent any true infection from setting in. 04/12/17 on evaluation today patient's ulcer on the plantar aspect of his right foot actually appears to be better then during last weeks evaluation. In general he has been tolerating the dressing changes in utilizing the offloading shoe which does seem to be beneficial for him. With that being said he still seems to get some pressure to the area just not nearly as much as he previously had noted. Again fortunately there is no significant pain. 04/19/17 on evaluation today patient appears to be doing excellent in regard to his right foot ulcer. He has been tolerating the dressing changes without complication. There really has not been any drainage over the past few days he tells me. With that being said he seems to be doing excellent in my opinion at this point he does have some callous buildup. 04/26/17 On evaluation today patient's wound appears to be completely healed which is great news. He has been tolerating the dressing  changes without complication and I do think he has done very well in regard to the healing process and offloading he has listed everything that that we instructed him to do. Obviously this appears to have paid off and he has progressed very nicely. Readmission: 07/05/17 on evaluation today patient is seen for fault evaluation and our clinic concerning the same issue that I have previously treated him for ending back in April 2019. This is a callous region with subsequently an ulcer underneath this on the plantar aspect of his right second metatarsal region. Fortunately he's not having any significant discomfort although his girlfriend told him that she noted an area of bruising at the site underneath the callous and wanted him to come get this checked out. He was very please with our care previous and therefore was more than happy to come let us check  this for him. Upon inspection initially he did have significant callous overlying the area in question. It was not easily identifiable as far as any ulcer was concerned. With that being said he with this significant callous did require some sharp debridement and following debridement there did appear to be a small ulcer underlying. Fortunately patient in general shows no signs of infection at this point. He again has neuropathy and therefore does not have any ongoing discomfort or pain at this point. 07/12/17 on evaluation today patient actually appears to be doing very well in regard to his plantar foot ulcer. In fact this appears to be completely healed and there's no residual opening at this time. Overall I'm very pleased in this regard. Patient also is extremely pleased. Readmission: 02/08/18 patient presents today for follow-up evaluation he has previously been seen here in our office for the same issue. Fortunately there is not been any evidence of infection since I last saw him he does note that he is been going to see his local podiatrist for callous pairing. During that time they actually noted that he had an open wound and subsequently referred him back ties for evaluation of this wound. He has done very well in the past with good wound care often healing in just a few weeks. No fevers, chills, nausea, or vomiting noted at this time. 02/15/18 on evaluation today patient actually appears to be doing very well in regard to his plantar foot ulcer. He is been tolerating the dressing changes without complication. There does appear to be some eschar of the line the wound surface. Fortunately I am happy with how things are progressing from the standpoint of the wound progress from last week to this week. 02/22/18 on evaluation today patient appears to be doing very well in regard to his plantar foot ulcer. He did have callous buildup around the area that required some sharp debridement today. He  tolerated the debridement without complication. Post debridement the wound bed appears to be doing much better there's just a very tiny opening still remaining. 03/01/18 on evaluation today patient appears to be doing excellent in regard to his plantar foot ulcer. In fact after I debrided away what appeared to be a dried blood clot on the surface and actually appear there was just a very small opening remaining in the central portion of the wound. This is literally a pinpoint region. Overall he has done well and I'm very pleased in this regard. I think that likely one more week will see this area completely healed. 03/08/18 on evaluation today patient appears to actually be doing very well in regard  to his plantar foot ulcer. He has been tolerating the dressing changes without complication. Fortunately this appears to be completely healed at this point which is excellent news. Overall I'm extremely happy with how things have progressed. MILLION, MAHARAJ ([id number]) 06/21/18 on evaluation today patient presents for evaluation in our clinic concerning issues that he's having with you seeing the area of his right foot that is previously experienced issues with. Fortunately there does not appear to be any evidence of infection at this time which is good news. With that being said he tells me that he has noted the callous buildup of the past several months but it was only in the past roughly week that he has noted drainage from the callous. He's not having any significant discomfort other than the fact that he states walking on the area with the extensive callous seems to be an issue. 06/28/18 on evaluation today patient actually appears to be doing very well in regard to his plantar foot ulcer. He's been tolerating the dressing changes without complication. There is some callous buildup though not nearly as much as noted last week. Subsequently there is some need for sharp debridement today. 07/05/18 on  evaluation today patient appears to be doing much better in regard to his plantar foot ulcer. This is not completely healed yet but it is thinking this morning and appears better than last week's evaluation. He has been tolerating the dressing changes without complication. 07/12/18 on evaluation today patient's foot ulcer actually appears to be showing some progress but again is not completely healed at this point. He is gonna require some sharp debridement today unfortunately. 07/18/18 on evaluation today patient appears to be doing about the same in regard to the plantar foot wound. Fortunately there's no signs of active infection. Unfortunately he hasn't made quite as much progress as I'd like to have seen. I do believe that he is doing better than obviously when he started but we still have some work to do. I think a total contact cast may be something to consider if he does not make a lot of significant improvement over the next week. With that being said the one issue is gonna be that he is done with a wound on his right foot which is going to cause some issues with driving if he has to have a cast the need to have somebody drive him. He's not sure that even has this capability. 07/26/18 on evaluation today patient appears to be doing better in regard to his plantar foot ulcer. He does have a little bit of a blood blister I believe that collagen is actually drying out and then getting stuck to the wound causing [place] to collect fluid underneath ugly this may be counterproductive. He states that he felt like he was actually doing somewhat better when he was utilizing the alginate as opposed to the collagen. 08/02/18-Patient returns at 1 week for evaluation of right plantar foot wound. He was just at the podiatry office where they removed some of the surrounding callus adjacent to the wound. The wound appears to be doing well and we are using silver cell at this time. 08/09/18 on evaluation today  patient appears to be doing really about the same in regard to his foot ulcer. He's been tolerating the dressing changes without complication with that being said I really do not see any significant improvements and again I previously discussed with him that I do believe we need to get a total contact  cast on his foot in order to get this to heal so that he doesn't have any further complications. He states that if things are not doing better by next week he's in agreement this plan. With that being said he will have have someone drive him as again this is his right foot. He also has spoken with Dr. Amalia Hailey regarding surgery to correct the deformity of his foot which is causing the issue currently. Subsequently Dr. Amalia Hailey is going to consider reconstructive surgery but has to wait until the ulcer heals another good reason to get this healed as quickly as possible. Patient History Information obtained from Patient. Family History Cancer - Siblings, Diabetes - Father, Heart Disease - Mother, Hypertension - Mother, No family history of Hereditary Spherocytosis, Kidney Disease, Lung Disease, Seizures, Stroke, Thyroid Problems, Tuberculosis. Social History Former smoker, Marital Status - Single, Alcohol Use - Never, Drug Use - No History, Caffeine Use - Moderate. Medical History Eyes Denies history of Cataracts, Glaucoma, Optic Neuritis Ear/Nose/Mouth/Throat Denies history of Chronic sinus problems/congestion, Middle ear problems Hematologic/Lymphatic Denies history of Anemia, Hemophilia, Human Immunodeficiency Virus, Lymphedema, Sickle Cell Disease Barreira, Faizan J. ([id number]) Respiratory Denies history of Aspiration, Asthma, Chronic Obstructive Pulmonary Disease (COPD), Pneumothorax, Sleep Apnea, Tuberculosis Cardiovascular Patient has history of Arrhythmia - a fib, Hypertension Denies history of Angina, Congestive Heart Failure, Coronary Artery Disease, Deep Vein Thrombosis,  Hypotension, Myocardial Infarction, Peripheral Arterial Disease, Peripheral Venous Disease, Phlebitis, Vasculitis Gastrointestinal Denies history of Cirrhosis , Colitis, Crohn s, Hepatitis A, Hepatitis B, Hepatitis C Endocrine Patient has history of Type II Diabetes Denies history of Type I Diabetes Genitourinary Denies history of End Stage Renal Disease Immunological Denies history of Lupus Erythematosus, Raynaud s, Scleroderma Integumentary (Skin) Denies history of History of Burn, History of pressure wounds Musculoskeletal Patient has history of Osteoarthritis Denies history of Gout, Rheumatoid Arthritis, Osteomyelitis Neurologic Denies history of Dementia, Neuropathy, Quadriplegia, Paraplegia, Seizure Disorder Oncologic Denies history of Received Chemotherapy, Received Radiation Medical And Surgical History Notes Cardiovascular High Cholesterol Review of Systems (ROS) Constitutional Symptoms (General Health) Denies complaints or symptoms of Fatigue, Fever, Chills, Marked Weight Change. Respiratory Denies complaints or symptoms of Chronic or frequent coughs, Shortness of Breath. Cardiovascular Denies complaints or symptoms of Chest pain, LE edema. Psychiatric Denies complaints or symptoms of Anxiety, Claustrophobia. Objective Constitutional Well-nourished and well-hydrated in no acute distress. Vitals Time Taken: 10:09 AM, Height: 74 in, Weight: 233 lbs, BMI: 29.9, Temperature: 98.0 F, Pulse: 92 bpm, Respiratory Rate: 16 breaths/min, Blood Pressure: 127/72 mmHg. Respiratory normal breathing without difficulty. clear to auscultation bilaterally. DIERKS, WACH ([id number]) Cardiovascular regular rate and rhythm with normal S1, S2. Psychiatric this patient is able to make decisions and demonstrates good insight into disease process. Alert and Oriented x 3. pleasant and cooperative. General Notes: My suggestion at this point based on what I was seeing was that we go  ahead and perform sharp debridement to clear away some of the necrotic tissue from the surface of the wound. He tolerated this without complication post debridement wound bed appears to be doing better I did have to utilize a little bit of silver nitrate to complete cauterize the wound to prevent bleeding. Integumentary (Hair, Skin) Wound #4 status is Open. Original cause of wound was Pressure Injury. The wound is located on the [place]. The wound measures 0.5cm length x 0.3cm width x 0.6cm depth; 0.118cm^2 area and 0.071cm^3 volume. There is Fat Layer (Subcutaneous Tissue) Exposed exposed. There is no tunneling noted, however, there  is undermining starting at 2:00 and ending at 10:00 with a maximum distance of 0.5cm. There is a medium amount of serosanguineous drainage noted. The wound margin is indistinct and nonvisible. There is small (1-33%) red granulation within the wound bed. There is a medium (34-66%) amount of necrotic tissue within the wound bed including Eschar and Adherent Slough. General Notes: Callus surrounding wound area. Assessment Active Problems ICD-10 Type 2 diabetes mellitus with foot ulcer Non-pressure chronic ulcer of other part of right foot with fat layer exposed Essential (primary) hypertension Chronic atrial fibrillation, unspecified Procedures Wound #4 Pre-procedure diagnosis of Wound #4 is a Diabetic Wound/Ulcer of the Lower Extremity located on the Right,Plantar Foot .Severity of Tissue Pre Debridement is: Fat layer exposed. There was a Excisional Skin/Subcutaneous Tissue Debridement with a total area of 4.5 sq cm performed by STONE III, HOYT E., PA-C. With the following instrument(s): Curette to remove Viable and Non-Viable tissue/material. Material removed includes Callus, Subcutaneous Tissue, and Slough after achieving pain control using Lidocaine 4% Topical Solution. No specimens were taken. A time out was conducted at 10:32, prior to the start  of the procedure. A Minimum amount of bleeding was controlled with Pressure. The procedure was tolerated well with a pain level of 0 throughout and a pain level of 0 following the procedure. Post Debridement Measurements: 0.8cm length x 0.8cm width x 0.2cm depth; 0.101cm^3 volume. Character of Wound/Ulcer Post Debridement is improved. Severity of Tissue Post Debridement is: Fat layer exposed. Post procedure Diagnosis Wound #4: Same as Pre-Procedure Alwine, Kenyatta J. ([id number]) Plan Wound Cleansing: Wound #4 Right,Plantar Foot: Clean wound with Normal Saline. Dial antibacterial soap, wash wounds, rinse and pat dry prior to dressing wounds May Shower, gently pat wound dry prior to applying new dressing. Anesthetic (add to Medication List): Wound #4 Right,Plantar Foot: Topical Lidocaine 4% cream applied to wound bed prior to debridement (In Clinic Only). Primary Wound Dressing: Wound #4 Right,Plantar Foot: Silver Alginate Secondary Dressing: Wound #4 Right,Plantar Foot: [place] Dressing Change Frequency: Wound #4 Right,Plantar Foot: Change dressing every other day. Follow-up Appointments: Wound #4 Right,Plantar Foot: Return Appointment in 1 week. Edema Control: Wound #4 Right,Plantar Foot: Elevate legs to the level of the heart and pump ankles as often as possible Off-Loading: Wound #4 Right,Plantar Foot: Open toe surgical shoe with peg assist. - right Additional Orders / Instructions: Wound #4 Right,Plantar Foot: Increase protein intake. At this point my suggestion is gonna be that we go ahead and continue with the above wound to measures for the next week and the patient is in agreement with that plan. If anything changes or worsens in the meantime he will contact the office and let me know. Otherwise will see were things stand at follow-up. Please see above for specific wound care orders. We will see patient for re-evaluation in 1 week(s) here in the clinic. If anything  worsens or changes patient will contact our office for additional recommendations. Electronic Signature(s) Signed: 08/09/2018 6:14:16 PM By: Worthy Keeler PA-C Entered By: Worthy Keeler on 08/09/2018 18:06:42 Godsey, [name] ([id number]) -------------------------------------------------------------------------------- ROS/PFSH Details Patient Name: DAREK, EIFLER Date of Service: 08/09/2018 10:15 AM Medical Record Number: [id number] Patient Account Number: [id number] Date of Birth/Sex: 1950-03-24 (68 y.o. M) Treating RN: Montey Hora Primary Care Provider: Benita Stabile Other Clinician: Referring Provider: Benita Stabile Treating Provider/Extender: STONE III, HOYT Weeks in Treatment: 7 Information Obtained From Patient Constitutional Symptoms (General Health) Complaints and Symptoms: Negative for: Fatigue; Fever; Chills; Marked Weight Change Respiratory  Complaints and Symptoms: Negative for: Chronic or frequent coughs; Shortness of Breath Medical History: Negative for: Aspiration; Asthma; Chronic Obstructive Pulmonary Disease (COPD); Pneumothorax; Sleep Apnea; Tuberculosis Cardiovascular Complaints and Symptoms: Negative for: Chest pain; LE edema Medical History: Positive for: Arrhythmia - a fib; Hypertension Negative for: Angina; Congestive Heart Failure; Coronary Artery Disease; Deep Vein Thrombosis; Hypotension; Myocardial Infarction; Peripheral Arterial Disease; Peripheral Venous Disease; Phlebitis; Vasculitis Past Medical History Notes: High Cholesterol Psychiatric Complaints and Symptoms: Negative for: Anxiety; Claustrophobia Eyes Medical History: Negative for: Cataracts; Glaucoma; Optic Neuritis Ear/Nose/Mouth/Throat Medical History: Negative for: Chronic sinus problems/congestion; Middle ear problems Hematologic/Lymphatic Medical History: Negative for: Anemia; Hemophilia; Human Immunodeficiency Virus; Lymphedema; Sickle Cell Disease Gastrointestinal KAVAN, DEVAN. ([id number]) Medical History: Negative for: Cirrhosis ; Colitis; Crohnos; Hepatitis A; Hepatitis B; Hepatitis C Endocrine Medical History: Positive for: Type II Diabetes Negative for: Type I Diabetes Time with diabetes: 3 years Treated with: Oral agents Blood sugar tested every day: No Genitourinary Medical History: Negative for: End Stage Renal Disease Immunological Medical History: Negative for: Lupus Erythematosus; Raynaudos; Scleroderma Integumentary (Skin) Medical History: Negative for: History of Burn; History of pressure wounds Musculoskeletal Medical History: Positive for: Osteoarthritis Negative for: Gout; Rheumatoid Arthritis; Osteomyelitis Neurologic Medical History: Negative for: Dementia; Neuropathy; Quadriplegia; Paraplegia; Seizure Disorder Oncologic Medical History: Negative for: Received Chemotherapy; Received Radiation Immunizations Pneumococcal Vaccine: Received Pneumococcal Vaccination: Yes Immunization Notes: up to date Implantable Devices No devices added Family and Social History Cancer: Yes - Siblings; Diabetes: Yes - Father; Heart Disease: Yes - Mother; Hereditary Spherocytosis: No; Hypertension: Yes - Mother; Kidney Disease: No; Lung Disease: No; Seizures: No; Stroke: No; Thyroid Problems: No; Tuberculosis: No; Former smoker; Marital Status - Single; Alcohol Use: Never; Drug Use: No History; Caffeine Use: Moderate; Financial Concerns: No; Food, Clothing or Shelter Needs: No; Support System Lacking: No; Transportation Concerns: No TABOR, DENHAM ([id number]) Physician Affirmation I have reviewed and agree with the above information. Electronic Signature(s) Signed: 08/09/2018 6:14:16 PM By: Worthy Keeler PA-C Signed: 08/11/2018 4:10:49 PM By: Montey Hora Entered By: Worthy Keeler on 08/09/2018 18:06:08 Maskell, [name] ([id number]) -------------------------------------------------------------------------------- SuperBill  Details Patient Name: CANNEN, DUPRAS. Date of Service: 08/09/2018 Medical Record Number: [id number] Patient Account Number: [id number] Date of Birth/Sex: [birth year] (68 y.o. M) Treating RN: Montey Hora Primary Care Provider: Benita Stabile Other Clinician: Referring Provider: Benita Stabile Treating Provider/Extender: Melburn Hake, HOYT Weeks in Treatment: 7 Diagnosis Coding ICD-10 Codes Code Description E11.621 Type 2 diabetes mellitus with foot ulcer L97.512 Non-pressure chronic ulcer of other part of right foot with fat layer exposed I10 Essential (primary) hypertension I48.20 Chronic atrial fibrillation, unspecified Facility Procedures CPT4 Code: 32992426 Description: [place] - DEB SUBQ TISSUE 20 SQ CM/< ICD-10 Diagnosis Description L97.512 Non-pressure chronic ulcer of other part of right foot with fa Modifier: t layer exposed Quantity: 1 Physician Procedures CPT4 Code: [id number] Description: 22979 - WC PHYS SUBQ TISS 20 SQ CM ICD-10 Diagnosis Description L97.512 Non-pressure chronic ulcer of other part of right foot with fa Modifier: t layer exposed Quantity: 1 Electronic Signature(s) Signed: 08/09/2018 6:14:16 PM By: Worthy Keeler PA-C Entered By: Worthy Keeler on 08/09/2018 18:06:49

## 2018-08-15 ENCOUNTER — Encounter: Payer: PPO | Admitting: Physician Assistant

## 2018-08-15 ENCOUNTER — Other Ambulatory Visit: Payer: Self-pay

## 2018-08-15 DIAGNOSIS — L97512 Non-pressure chronic ulcer of other part of right foot with fat layer exposed: Secondary | ICD-10-CM | POA: Diagnosis not present

## 2018-08-15 DIAGNOSIS — E11621 Type 2 diabetes mellitus with foot ulcer: Secondary | ICD-10-CM | POA: Diagnosis not present

## 2018-08-15 NOTE — Progress Notes (Addendum)
DREY, SHAFF ([id number]) Visit Report for 08/15/2018 Chief Complaint Document Details Patient Name: [name], [name]. Date of Service: 08/15/2018 9:30 AM Medical Record Number: [id number] Patient Account Number: [id number] Date of Birth/Sex: 11/19/1950 (68 y.o. M) Treating RN: Harold Barban Primary Care Provider: Benita Stabile Other Clinician: Referring Provider: Benita Stabile Treating Provider/Extender: Melburn Hake, HOYT Weeks in Treatment: 7 Information Obtained from: Patient Chief Complaint Right plantar foot ulcer Electronic Signature(s) Signed: 08/15/2018 9:46:19 AM By: Worthy Keeler PA-C Entered By: Worthy Keeler on 08/15/2018 09:46:18 Teutsch, [name] ([id number]) -------------------------------------------------------------------------------- Debridement Details Patient Name: [name]. Date of Service: 08/15/2018 9:30 AM Medical Record Number: [id number] Patient Account Number: [id number] Date of Birth/Sex: 08-14-1950 (68 y.o. M) Treating RN: Harold Barban Primary Care Provider: Benita Stabile Other Clinician: Referring Provider: Benita Stabile Treating Provider/Extender: STONE III, HOYT Weeks in Treatment: 7 Debridement Performed for Wound #4 Right,Plantar Foot Assessment: Performed By: Physician STONE III, HOYT E., PA-C Debridement Type: Debridement Severity of Tissue Pre Fat layer exposed Debridement: Level of Consciousness (Pre- Awake and Alert procedure): Pre-procedure Verification/Time Yes - 09:50 Out Taken: Start Time: 09:50 Pain Control: Lidocaine Total Area Debrided (L x W): 0.1 (cm) x 0.1 (cm) = 0.01 (cm) Tissue and other material Viable, Non-Viable, Callus, Slough, Subcutaneous, Slough debrided: Level: Skin/Subcutaneous Tissue Debridement Description: Excisional Instrument: Curette Bleeding: Minimum Hemostasis Achieved: Pressure End Time: 09:53 Procedural Pain: 0 Post Procedural Pain: 0 Response to Treatment: Procedure was tolerated  well Level of Consciousness Awake and Alert (Post-procedure): Post Debridement Measurements of Total Wound Length: (cm) 0.1 Width: (cm) 0.1 Depth: (cm) 0.1 Volume: (cm) 0.001 Character of Wound/Ulcer Post Debridement: Improved Severity of Tissue Post Debridement: Fat layer exposed Post Procedure Diagnosis Same as Pre-procedure Electronic Signature(s) Signed: 08/16/2018 4:21:20 PM By: Harold Barban Signed: 08/17/2018 9:47:59 AM By: Worthy Keeler PA-C Entered By: Harold Barban on 08/15/2018 09:52:48 [name], [name] ([id number]) -------------------------------------------------------------------------------- HPI Details Patient Name: [name]. Date of Service: 08/15/2018 9:30 AM Medical Record Number: [id number] Patient Account Number: [id number] Date of Birth/Sex: 12/23/1950 (68 y.o. M) Treating RN: Harold Barban Primary Care Provider: Benita Stabile Other Clinician: Referring Provider: Benita Stabile Treating Provider/Extender: Melburn Hake, HOYT Weeks in Treatment: 7 History of Present Illness Associated Signs and Symptoms: Patient has a history of diabetes mellitus type II, hypertension, and atrial fibrillation. He also has a hammertoe deformity of the bilateral feet which seems to be causing pressure in the ball of his foot. HPI Description: 03/09/17 on evaluation today patient presents for his initial evaluation concerning an ulcer on the plantar aspect of his right foot which has been open he tells me for about three weeks. He has been seen at [facility] where they did perform debridement it appears according to a note on 02/22/17 where unfortunately it appears that his callous area began to break down into an ulcer after modifications have been added to his insulin. This had happened previously as well. However I do not have details of the severity of debridement at that point although it does not sound as if you the debridement was too significant based on what the  patient is telling me that he still had a lot of callous following debridement. With that being said they were going to look into altering his insoles to try to prevent further breakdown in the future. Nonetheless he has had foul odor discharge coming from the ulcer which is noted all the way back to that [facility] Patient states that this was concerning him more than anything else. He does have diabetes although he described this as "borderline diabetes" he is on metoprolol however along with lisinopril. Patient is not having any issues with pain in regard to his right plantar foot. 03/16/17 on evaluation today patient appears to be doing much better in regard to his plantar foot ulcer. He actually tells me that he loves the peg assist offloading shoe and that he hasn't had any pain in the ulcer area from the callous since I worked on it last week. Overall he is extremely happy with how things have progressed. Likewise the wound bed has no slough noted there is no evidence of infection and it looks excellent. I did receive the results of his hemoglobin A1c which showed a value of 5.9 which was elevated but actually rather well. Subsequently I did also receive the x-ray of his foot which showed diffuse degenerative change but no underlying acute bony abnormality. 03/23/17 on evaluation today patient appears to be doing better in regard to his right plantar foot ulcer. He continues to show signs of improvement there's definitely not as much drainage at this point. He has been tolerating the dressing changes without complication he is not happy with the peg assist offloading shoe but at the same time I do believe that it is making good progress as far as offloading is concerned. I still believe he may need to talk to a surgeon about surgically correcting a hammertoe in order to avoid additional pressure to the site especially since he Artie has diabetic shoes and they do not  seem to have prevented callous buildup in ulcer formation. 03/30/17 on evaluation today patient appears to be doing better in regard to his right plantar foot ulcer. Continues to show signs of improvement which is good news. Unfortunately we were unable to get the appointment with the orthopedic, Dr. Doran Durand, whom I recommended for him due to patient having a balance at the practice. With that being said patient's foot ulcer does seem to be doing much better on evaluation today he still has some depth to the wound but overall we are seeing improvement and epithelialization week by week. Hopefully this is something that will close shortly. 04/05/17 on evaluation today patient unfortunately has what appears to be an area of the plantar surface of his ulcer where he had fluid collection where the callous grew over top of the wound bed. Unfortunately there was some pus like material noted during cleansing that we did sent for culture today. Hopefully this is not truly an infection is or does not appear to be any evidence of erythema surrounding and maybe this is just simply a small setback with a fluid collection that has caused this issue. Nonetheless we will see what that shows when we get the culture back. I am also going to have it completely antibiotic which I previously placed him on anyway which will hopefully prevent any true infection from setting in. 04/12/17 on evaluation today patient's ulcer on the plantar aspect of his right foot actually appears to be better then during last weeks evaluation. In general he has been tolerating the dressing changes in utilizing the offloading shoe which does seem to be beneficial for him. With that being said he still seems to get some pressure to the area just not nearly as much as he previously had noted. Again fortunately there is no significant pain. [name], [name] ([id number]) 04/19/17 on evaluation today patient  appears to be doing excellent in regard to  his right foot ulcer. He has been tolerating the dressing changes without complication. There really has not been any drainage over the past few days he tells me. With that being said he seems to be doing excellent in my opinion at this point he does have some callous buildup. 04/26/17 On evaluation today patient's wound appears to be completely healed which is great news. He has been tolerating the dressing changes without complication and I do think he has done very well in regard to the healing process and offloading he has listed everything that that we instructed him to do. Obviously this appears to have paid off and he has progressed very nicely. Readmission: 07/05/17 on evaluation today patient is seen for fault evaluation and our clinic concerning the same issue that I have previously treated him for ending back in April 2019. This is a callous region with subsequently an ulcer underneath this on the plantar aspect of his right second metatarsal region. Fortunately he's not having any significant discomfort although his girlfriend told him that she noted an area of bruising at the site underneath the callous and wanted him to come get this checked out. He was very please with our care previous and therefore was more than happy to come let us check this for him. Upon inspection initially he did have significant callous overlying the area in question. It was not easily identifiable as far as any ulcer was concerned. With that being said he with this significant callous did require some sharp debridement and following debridement there did appear to be a small ulcer underlying. Fortunately patient in general shows no signs of infection at this point. He again has neuropathy and therefore does not have any ongoing discomfort or pain at this point. 07/12/17 on evaluation today patient actually appears to be doing very well in regard to his plantar foot ulcer. In fact this appears to be completely  healed and there's no residual opening at this time. Overall I'm very pleased in this regard. Patient also is extremely pleased. Readmission: 02/08/18 patient presents today for follow-up evaluation he has previously been seen here in our office for the same issue. Fortunately there is not been any evidence of infection since I last saw him he does note that he is been going to see his local podiatrist for callous pairing. During that time they actually noted that he had an open wound and subsequently referred him back ties for evaluation of this wound. He has done very well in the past with good wound care often healing in just a few weeks. No fevers, chills, nausea, or vomiting noted at this time. 02/15/18 on evaluation today patient actually appears to be doing very well in regard to his plantar foot ulcer. He is been tolerating the dressing changes without complication. There does appear to be some eschar of the line the wound surface. Fortunately I am happy with how things are progressing from the standpoint of the wound progress from last week to this week. 02/22/18 on evaluation today patient appears to be doing very well in regard to his plantar foot ulcer. He did have callous buildup around the area that required some sharp debridement today. He tolerated the debridement without complication. Post debridement the wound bed appears to be doing much better there's just a very tiny opening still remaining. 03/01/18 on evaluation today patient appears to be doing excellent in regard to his plantar foot ulcer. In  fact after I debrided away what appeared to be a dried blood clot on the surface and actually appear there was just a very small opening remaining in the central portion of the wound. This is literally a pinpoint region. Overall he has done well and I'm very pleased in this regard. I think that likely one more week will see this area completely healed. 03/08/18 on evaluation today patient  appears to actually be doing very well in regard to his plantar foot ulcer. He has been tolerating the dressing changes without complication. Fortunately this appears to be completely healed at this point which is excellent news. Overall I'm extremely happy with how things have progressed. 06/21/18 on evaluation today patient presents for evaluation in our clinic concerning issues that he's having with you seeing the area of his right foot that is previously experienced issues with. Fortunately there does not appear to be any evidence of infection at this time which is good news. With that being said he tells me that he has noted the callous buildup of the past several months but it was only in the past roughly week that he has noted drainage from the callous. He's not having any significant discomfort other than the fact that he states walking on the area with the extensive callous seems to be an issue. 06/28/18 on evaluation today patient actually appears to be doing very well in regard to his plantar foot ulcer. He's been tolerating the dressing changes without complication. There is some callous buildup though not nearly as much as noted last [name], [name]. ([id number]) week. Subsequently there is some need for sharp debridement today. 07/05/18 on evaluation today patient appears to be doing much better in regard to his plantar foot ulcer. This is not completely healed yet but it is thinking this morning and appears better than last week's evaluation. He has been tolerating the dressing changes without complication. 07/12/18 on evaluation today patient's foot ulcer actually appears to be showing some progress but again is not completely healed at this point. He is gonna require some sharp debridement today unfortunately. 07/18/18 on evaluation today patient appears to be doing about the same in regard to the plantar foot wound. Fortunately there's no signs of active infection. Unfortunately he hasn't  made quite as much progress as I'd like to have seen. I do believe that he is doing better than obviously when he started but we still have some work to do. I think a total contact cast may be something to consider if he does not make a lot of significant improvement over the next week. With that being said the one issue is gonna be that he is done with a wound on his right foot which is going to cause some issues with driving if he has to have a cast the need to have somebody drive him. He's not sure that even has this capability. 07/26/18 on evaluation today patient appears to be doing better in regard to his plantar foot ulcer. He does have a little bit of a blood blister I believe that collagen is actually drying out and then getting stuck to the wound causing [place] to collect fluid underneath ugly this may be counterproductive. He states that he felt like he was actually doing somewhat better when he was utilizing the alginate as opposed to the collagen. 08/02/18-Patient returns at 1 week for evaluation of right plantar foot wound. He was just at the podiatry office where they removed some of the  surrounding callus adjacent to the wound. The wound appears to be doing well and we are using silver cell at this time. 08/09/18 on evaluation today patient appears to be doing really about the same in regard to his foot ulcer. He's been tolerating the dressing changes without complication with that being said I really do not see any significant improvements and again I previously discussed with him that I do believe we need to get a total contact cast on his foot in order to get this to heal so that he doesn't have any further complications. He states that if things are not doing better by next week he's in agreement this plan. With that being said he will have have someone drive him as again this is his right foot. He also has spoken with Dr. Amalia Hailey regarding surgery to correct the deformity of his foot  which is causing the issue currently. Subsequently Dr. Amalia Hailey is going to consider reconstructive surgery but has to wait until the ulcer heals another good reason to get this healed as quickly as possible. 08/15/2018 on evaluation today patient actually appears to be doing better in regard to his plantar foot ulcer. We did give him another PEG assist offloading shoe last week and he seems to have done very well with this. I feel that this may have been the issue with why he was not healing as rapidly as he had in the past. With that being said he is not completely healed at this point but it is much closer than has been in quite some time. Fortunately there is no signs of active infection at this time. Electronic Signature(s) Signed: 08/15/2018 10:04:42 AM By: Worthy Keeler PA-C Entered By: Worthy Keeler on 08/15/2018 10:04:41 [name], [name] ([id number]) -------------------------------------------------------------------------------- Physical Exam Details Patient Name: [name], [name]. Date of Service: 08/15/2018 9:30 AM Medical Record Number: [id number] Patient Account Number: [id number] Date of Birth/Sex: 1950-10-22 (68 y.o. M) Treating RN: Harold Barban Primary Care Provider: Benita Stabile Other Clinician: Referring Provider: TATE, Sharlet Salina Treating Provider/Extender: STONE III, HOYT Weeks in Treatment: 7 Constitutional Well-nourished and well-hydrated in no acute distress. Respiratory normal breathing without difficulty. clear to auscultation bilaterally. Cardiovascular regular rate and rhythm with normal S1, S2. Psychiatric this patient is able to make decisions and demonstrates good insight into disease process. Alert and Oriented x 3. pleasant and cooperative. Notes Patient's wound bed currently showed signs of some callus as well as necrotic tissue on the surface of the wound including slough which I was able to debride down to good subcutaneous tissue he tolerated this today  without complication post debridement wound bed appears to be doing much better. Electronic Signature(s) Signed: 08/15/2018 10:05:20 AM By: Worthy Keeler PA-C Entered By: Worthy Keeler on 08/15/2018 10:05:19 Corrie, [name] ([id number]) -------------------------------------------------------------------------------- Physician Orders Details Patient Name: [name], [name]. Date of Service: 08/15/2018 9:30 AM Medical Record Number: [id number] Patient Account Number: [id number] Date of Birth/Sex: August 19, 1950 (68 y.o. M) Treating RN: Harold Barban Primary Care Provider: Benita Stabile Other Clinician: Referring Provider: Benita Stabile Treating Provider/Extender: Melburn Hake, HOYT Weeks in Treatment: 7 Verbal / Phone Orders: No Diagnosis Coding ICD-10 Coding Code Description E11.621 Type 2 diabetes mellitus with foot ulcer L97.512 Non-pressure chronic ulcer of other part of right foot with fat layer exposed I10 Essential (primary) hypertension I48.20 Chronic atrial fibrillation, unspecified Wound Cleansing Wound #4 Right,Plantar Foot o Clean wound with Normal Saline. o Dial antibacterial soap, wash wounds, rinse and pat dry prior  to dressing wounds o May Shower, gently pat wound dry prior to applying new dressing. Anesthetic (add to Medication List) Wound #4 Right,Plantar Foot o Topical Lidocaine 4% cream applied to wound bed prior to debridement (In Clinic Only). Primary Wound Dressing Wound #4 Right,Plantar Foot o Silver Alginate Secondary Dressing Wound #4 [place] Dressing Change Frequency Wound #4 Right,Plantar Foot o Change dressing every other day. Follow-up Appointments Wound #4 Right,Plantar Foot o Return Appointment in 1 week. Edema Control Wound #4 Right,Plantar Foot o Elevate legs to the level of the heart and pump ankles as often as possible Off-Loading Wound #4 Right,Plantar Foot o Open toe surgical shoe with peg assist.  - right Evangelist, Barret J. ([id number]) Additional Orders / Instructions Wound #4 Right,Plantar Foot o Increase protein intake. Electronic Signature(s) Signed: 08/16/2018 4:21:20 PM By: Harold Barban Signed: 08/17/2018 9:47:59 AM By: Worthy Keeler PA-C Entered By: Harold Barban on 08/15/2018 09:54:24 [name], [name] ([id number]) -------------------------------------------------------------------------------- Problem List Details Patient Name: JAXSUN, CIAMPI. Date of Service: 08/15/2018 9:30 AM Medical Record Number: [id number] Patient Account Number: [id number] Date of Birth/Sex: 10-04-1950 (68 y.o. 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 Date of Service: 08/15/2018 9:30 AM Medical Record Number: [id number] Patient Account Number: [id number] Date of Birth/Sex: [birth year] (68 y.o. M) Treating RN: Harold Barban Primary Care Provider: Benita Stabile Other Clinician: Referring Provider: Benita Stabile Treating Provider/Extender: Melburn Hake, HOYT Weeks in Treatment: 7 Subjective Chief Complaint Information obtained from Patient Right plantar foot  ulcer History of Present Illness (HPI) The following HPI elements were documented for the patient's wound: Associated Signs and Symptoms: Patient has a history of diabetes mellitus type II, hypertension, and atrial fibrillation. He also has a hammertoe deformity of the bilateral feet which seems to be causing pressure in the ball of his foot. 03/09/17 on evaluation today patient presents for his initial evaluation concerning an ulcer on the plantar aspect of his right foot which has been open he tells me for about three weeks. He has been seen at [facility] where they did perform debridement it appears according to a note on 02/22/17 where unfortunately it appears that his callous area began to break down into an ulcer after modifications have been added to his insulin. This had happened previously as well. However I do not have details of the severity of debridement at that point although it does not sound as if you the debridement was too significant based on what the patient is telling me that he still had a lot of callous following debridement. With that being said they were going to look into altering his insoles to try to prevent further breakdown in the future. Nonetheless he has had foul odor discharge coming from the ulcer which is noted all the way back to that visit [facility] Patient states that  this was concerning him more than anything else. He does have diabetes although he described this as "borderline diabetes" he is on metoprolol however along with lisinopril. Patient is not having any issues with pain in regard to his right plantar foot. 03/16/17 on evaluation today patient appears to be doing much better in regard to his plantar foot ulcer. He actually tells me that he loves the peg assist offloading shoe and that he hasn't had any pain in the ulcer area from the callous since I worked on it last week. Overall he is extremely happy with how things have  progressed. Likewise the wound bed has no slough noted there is no evidence of infection and it looks excellent. I did receive the results of his hemoglobin A1c which showed a value of 5.9 which was elevated but actually rather well. Subsequently I did also receive the x-ray of his foot which showed diffuse degenerative change but no underlying acute bony abnormality. 03/23/17 on evaluation today patient appears to be doing better in regard to his right plantar foot ulcer. He continues to show signs of improvement there's definitely not as much drainage at this point. He has been tolerating the dressing changes without complication he is not happy with the peg assist offloading shoe but at the same time I do believe that it is making good progress as far as offloading is concerned. I still believe he may need to talk to a surgeon about surgically correcting a hammertoe in order to avoid additional pressure to the site especially since he Artie has diabetic shoes and they do not seem to have prevented callous buildup in ulcer formation. 03/30/17 on evaluation today patient appears to be doing better in regard to his right plantar foot ulcer. Continues to show signs of improvement which is good news. Unfortunately we were unable to get the appointment with the orthopedic, Dr. Doran Durand, whom I recommended for him due to patient having a balance at the practice. With that being said patient's foot ulcer does seem to be doing much better on evaluation today he still has some depth to the wound but overall we are seeing improvement and epithelialization week by week. Hopefully this is something that will close shortly. 04/05/17 on evaluation today patient unfortunately has what appears to be an area of the plantar surface of his ulcer where he had fluid collection where the callous grew over top of the wound bed. Unfortunately there was some pus like material noted during cleansing that we did sent for culture  today. Hopefully this is not truly an infection is or does not appear to be any ISABELLA, IDA. ([id number]) evidence of erythema surrounding and maybe this is just simply a small setback with a fluid collection that has caused this issue. Nonetheless we will see what that shows when we get the culture back. I am also going to have it completely antibiotic which I previously placed him on anyway which will hopefully prevent any true infection from setting in. 04/12/17 on evaluation today patient's ulcer on the plantar aspect of his right foot actually appears to be better then during last weeks evaluation. In general he has been tolerating the dressing changes in utilizing the offloading shoe which does seem to be beneficial for him. With that being said he still seems to get some pressure to the area just not nearly as much as he previously had noted. Again fortunately there is no significant pain. 04/19/17 on evaluation today patient appears to be  doing excellent in regard to his right foot ulcer. He has been tolerating the dressing changes without complication. There really has not been any drainage over the past few days he tells me. With that being said he seems to be doing excellent in my opinion at this point he does have some callous buildup. 04/26/17 On evaluation today patient's wound appears to be completely healed which is great news. He has been tolerating the dressing changes without complication and I do think he has done very well in regard to the healing process and offloading he has listed everything that that we instructed him to do. Obviously this appears to have paid off and he has progressed very nicely. Readmission: 07/05/17 on evaluation today patient is seen for fault evaluation and our clinic concerning the same issue that I have previously treated him for ending back in April 2019. This is a callous region with subsequently an ulcer underneath this on the plantar aspect of his  right second metatarsal region. Fortunately he's not having any significant discomfort although his girlfriend told him that she noted an area of bruising at the site underneath the callous and wanted him to come get this checked out. He was very please with our care previous and therefore was more than happy to come let us check this for him. Upon inspection initially he did have significant callous overlying the area in question. It was not easily identifiable as far as any ulcer was concerned. With that being said he with this significant callous did require some sharp debridement and following debridement there did appear to be a small ulcer underlying. Fortunately patient in general shows no signs of infection at this point. He again has neuropathy and therefore does not have any ongoing discomfort or pain at this point. 07/12/17 on evaluation today patient actually appears to be doing very well in regard to his plantar foot ulcer. In fact this appears to be completely healed and there's no residual opening at this time. Overall I'm very pleased in this regard. Patient also is extremely pleased. Readmission: 02/08/18 patient presents today for follow-up evaluation he has previously been seen here in our office for the same issue. Fortunately there is not been any evidence of infection since I last saw him he does note that he is been going to see his local podiatrist for callous pairing. During that time they actually noted that he had an open wound and subsequently referred him back ties for evaluation of this wound. He has done very well in the past with good wound care often healing in just a few weeks. No fevers, chills, nausea, or vomiting noted at this time. 02/15/18 on evaluation today patient actually appears to be doing very well in regard to his plantar foot ulcer. He is been tolerating the dressing changes without complication. There does appear to be some eschar of the line the wound  surface. Fortunately I am happy with how things are progressing from the standpoint of the wound progress from last week to this week. 02/22/18 on evaluation today patient appears to be doing very well in regard to his plantar foot ulcer. He did have callous buildup around the area that required some sharp debridement today. He tolerated the debridement without complication. Post debridement the wound bed appears to be doing much better there's just a very tiny opening still remaining. 03/01/18 on evaluation today patient appears to be doing excellent in regard to his plantar foot ulcer. In fact after I  debrided away what appeared to be a dried blood clot on the surface and actually appear there was just a very small opening remaining in the central portion of the wound. This is literally a pinpoint region. Overall he has done well and I'm very pleased in this regard. I think that likely one more week will see this area completely healed. 03/08/18 on evaluation today patient appears to actually be doing very well in regard to his plantar foot ulcer. He has been tolerating the dressing changes without complication. Fortunately this appears to be completely healed at this point which is excellent news. Overall I'm extremely happy with how things have progressed. [name], [name] ([id number]) 06/21/18 on evaluation today patient presents for evaluation in our clinic concerning issues that he's having with you seeing the area of his right foot that is previously experienced issues with. Fortunately there does not appear to be any evidence of infection at this time which is good news. With that being said he tells me that he has noted the callous buildup of the past several months but it was only in the past roughly week that he has noted drainage from the callous. He's not having any significant discomfort other than the fact that he states walking on the area with the extensive callous seems to be an  issue. 06/28/18 on evaluation today patient actually appears to be doing very well in regard to his plantar foot ulcer. He's been tolerating the dressing changes without complication. There is some callous buildup though not nearly as much as noted last week. Subsequently there is some need for sharp debridement today. 07/05/18 on evaluation today patient appears to be doing much better in regard to his plantar foot ulcer. This is not completely healed yet but it is thinking this morning and appears better than last week's evaluation. He has been tolerating the dressing changes without complication. 07/12/18 on evaluation today patient's foot ulcer actually appears to be showing some progress but again is not completely healed at this point. He is gonna require some sharp debridement today unfortunately. 07/18/18 on evaluation today patient appears to be doing about the same in regard to the plantar foot wound. Fortunately there's no signs of active infection. Unfortunately he hasn't made quite as much progress as I'd like to have seen. I do believe that he is doing better than obviously when he started but we still have some work to do. I think a total contact cast may be something to consider if he does not make a lot of significant improvement over the next week. With that being said the one issue is gonna be that he is done with a wound on his right foot which is going to cause some issues with driving if he has to have a cast the need to have somebody drive him. He's not sure that even has this capability. 07/26/18 on evaluation today patient appears to be doing better in regard to his plantar foot ulcer. He does have a little bit of a blood blister I believe that collagen is actually drying out and then getting stuck to the wound causing [place] to collect fluid underneath ugly this may be counterproductive. He states that he felt like he was actually doing somewhat better when he was utilizing the  alginate as opposed to the collagen. 08/02/18-Patient returns at 1 week for evaluation of right plantar foot wound. He was just at the podiatry office where they removed some of the surrounding callus adjacent  to the wound. The wound appears to be doing well and we are using silver cell at this time. 08/09/18 on evaluation today patient appears to be doing really about the same in regard to his foot ulcer. He's been tolerating the dressing changes without complication with that being said I really do not see any significant improvements and again I previously discussed with him that I do believe we need to get a total contact cast on his foot in order to get this to heal so that he doesn't have any further complications. He states that if things are not doing better by next week he's in agreement this plan. With that being said he will have have someone drive him as again this is his right foot. He also has spoken with Dr. Amalia Hailey regarding surgery to correct the deformity of his foot which is causing the issue currently. Subsequently Dr. Amalia Hailey is going to consider reconstructive surgery but has to wait until the ulcer heals another good reason to get this healed as quickly as possible. 08/15/2018 on evaluation today patient actually appears to be doing better in regard to his plantar foot ulcer. We did give him another PEG assist offloading shoe last week and he seems to have done very well with this. I feel that this may have been the issue with why he was not healing as rapidly as he had in the past. With that being said he is not completely healed at this point but it is much closer than has been in quite some time. Fortunately there is no signs of active infection at this time. Patient History Information obtained from Patient. Family History Cancer - Siblings, Diabetes - Father, Heart Disease - Mother, Hypertension - Mother, No family history of Hereditary Spherocytosis, Kidney Disease, Lung  Disease, Seizures, Stroke, Thyroid Problems, Tuberculosis. Social History Former smoker, Marital Status - Single, Alcohol Use - Never, Drug Use - No History, Caffeine Use - Moderate. Medical History Eyes WENDELIN, BRADT ([id number]) Denies history of Cataracts, Glaucoma, Optic Neuritis Ear/Nose/Mouth/Throat Denies history of Chronic sinus problems/congestion, Middle ear problems Hematologic/Lymphatic Denies history of Anemia, Hemophilia, Human Immunodeficiency Virus, Lymphedema, Sickle Cell Disease Respiratory Denies history of Aspiration, Asthma, Chronic Obstructive Pulmonary Disease (COPD), Pneumothorax, Sleep Apnea, Tuberculosis Cardiovascular Patient has history of Arrhythmia - a fib, Hypertension Denies history of Angina, Congestive Heart Failure, Coronary Artery Disease, Deep Vein Thrombosis, Hypotension, Myocardial Infarction, Peripheral Arterial Disease, Peripheral Venous Disease, Phlebitis, Vasculitis Gastrointestinal Denies history of Cirrhosis , Colitis, Crohn s, Hepatitis A, Hepatitis B, Hepatitis C Endocrine Patient has history of Type II Diabetes Denies history of Type I Diabetes Genitourinary Denies history of End Stage Renal Disease Immunological Denies history of Lupus Erythematosus, Raynaud s, Scleroderma Integumentary (Skin) Denies history of History of Burn, History of pressure wounds Musculoskeletal Patient has history of Osteoarthritis Denies history of Gout, Rheumatoid Arthritis, Osteomyelitis Neurologic Denies history of Dementia, Neuropathy, Quadriplegia, Paraplegia, Seizure Disorder Oncologic Denies history of Received Chemotherapy, Received Radiation Medical And Surgical History Notes Cardiovascular High Cholesterol Review of Systems (ROS) Constitutional Symptoms (General Health) Denies complaints or symptoms of Fatigue, Fever, Chills, Marked Weight Change. Respiratory Denies complaints or symptoms of Chronic or frequent coughs, Shortness of  Breath. Cardiovascular Denies complaints or symptoms of Chest pain, LE edema. Psychiatric Denies complaints or symptoms of Anxiety, Claustrophobia. Objective Constitutional Well-nourished and well-hydrated in no acute distress. Vitals Time Taken: 9:32 AM, Height: 74 in, Weight: 233 lbs, BMI: 29.9, Temperature: 98.2 F, Pulse: 118 bpm, Respiratory Tibbits, Darwyn  J. ([id number]) Rate: 16 breaths/min, Blood Pressure: 125/86 mmHg. Respiratory normal breathing without difficulty. clear to auscultation bilaterally. Cardiovascular regular rate and rhythm with normal S1, S2. Psychiatric this patient is able to make decisions and demonstrates good insight into disease process. Alert and Oriented x 3. pleasant and cooperative. General Notes: Patient's wound bed currently showed signs of some callus as well as necrotic tissue on the surface of the wound including slough which I was able to debride down to good subcutaneous tissue he tolerated this today without complication post debridement wound bed appears to be doing much better. Integumentary (Hair, Skin) Wound #4 status is Open. Original cause of wound was Pressure Injury. The wound is located on the [place]. The wound measures 0.1cm length x 0.1cm width x 0.1cm depth; 0.008cm^2 area and 0.001cm^3 volume. The wound is limited to skin breakdown. There is no tunneling or undermining noted. There is a none present amount of drainage noted. The wound margin is indistinct and nonvisible. There is no granulation within the wound bed. There is no necrotic tissue within the wound bed. Assessment Active Problems ICD-10 Type 2 diabetes mellitus with foot ulcer Non-pressure chronic ulcer of other part of right foot with fat layer exposed Essential (primary) hypertension Chronic atrial fibrillation, unspecified Procedures Wound #4 Pre-procedure diagnosis of Wound #4 is a Diabetic Wound/Ulcer of the Lower Extremity located on the  Right,Plantar Foot .Severity of Tissue Pre Debridement is: Fat layer exposed. There was a Excisional Skin/Subcutaneous Tissue Debridement with a total area of 0.01 sq cm performed by STONE III, HOYT E., PA-C. With the following instrument(s): Curette to remove Viable and Non-Viable tissue/material. Material removed includes Callus, Subcutaneous Tissue, and Slough after achieving pain control using Lidocaine. No specimens were taken. A time out was conducted at 09:50, prior to the start of the procedure. A Minimum amount of bleeding was controlled with Pressure. The procedure was tolerated well with a pain level of 0 throughout and a pain level of 0 following the procedure. Post Debridement Measurements: 0.1cm length x 0.1cm width x 0.1cm depth; 0.001cm^3 volume. Character of Wound/Ulcer Post Debridement is improved. Severity of Tissue Post Debridement is: Fat layer exposed. Post procedure Diagnosis Wound #4: Same as Pre-Procedure Jeanpaul, Judea J. ([id number]) Plan Wound Cleansing: Wound #4 Right,Plantar Foot: Clean wound with Normal Saline. Dial antibacterial soap, wash wounds, rinse and pat dry prior to dressing wounds May Shower, gently pat wound dry prior to applying new dressing. Anesthetic (add to Medication List): Wound #4 Right,Plantar Foot: Topical Lidocaine 4% cream applied to wound bed prior to debridement (In Clinic Only). Primary Wound Dressing: Wound #4 Right,Plantar Foot: Silver Alginate Secondary Dressing: Wound #4 Right,Plantar Foot: [place] Dressing Change Frequency: Wound #4 Right,Plantar Foot: Change dressing every other day. Follow-up Appointments: Wound #4 Right,Plantar Foot: Return Appointment in 1 week. Edema Control: Wound #4 Right,Plantar Foot: Elevate legs to the level of the heart and pump ankles as often as possible Off-Loading: Wound #4 Right,Plantar Foot: Open toe surgical shoe with peg assist. - right Additional Orders / Instructions: Wound #4  Right,Plantar Foot: Increase protein intake. 1. I am going to suggest that we continue with a silver alginate since this seems to be beneficial for him and he is always responded well. We will continue as such for the next week. 2. I am going to suggest that he continue to wear the PEG assist offloading shoe which again I do believe has been beneficial for him and I think we will continue to  be so. 3. With regard to the total contact cast will get a hold off on that at this time since he is done very well with the offloading shoe and of itself. Therefore we will not initiate casting today. We will see the for reevaluation in 1 week's time. If anything changes or worsens in the meantime we will contact the office and let me know. Otherwise my hope is that he will be showing signs of good improvement come next week possibly even completely healed based on what I saw from last week to this week. Electronic Signature(s) Signed: 08/15/2018 10:06:51 AM By: Worthy Keeler PA-C Entered By: Worthy Keeler on 08/15/2018 10:06:50 [name], [name] ([id number]) -------------------------------------------------------------------------------- ROS/PFSH Details Patient Name: VIVAAN, HELSETH Date of Service: 08/15/2018 9:30 AM Medical Record Number: [id number] Patient Account Number: [id number] Date of Birth/Sex: 06-Dec-1950 (68 y.o. M) Treating RN: Harold Barban Primary Care Provider: Benita Stabile Other Clinician: Referring Provider: Benita Stabile Treating Provider/Extender: STONE III, HOYT Weeks in Treatment: 7 Information Obtained From Patient Constitutional Symptoms (General Health) Complaints and Symptoms: Negative for: Fatigue; Fever; Chills; Marked Weight Change Respiratory Complaints and Symptoms: Negative for: Chronic or frequent coughs; Shortness of Breath Medical History: Negative for: Aspiration; Asthma; Chronic Obstructive Pulmonary Disease (COPD); Pneumothorax; Sleep  Apnea; Tuberculosis Cardiovascular Complaints and Symptoms: Negative for: Chest pain; LE edema Medical History: Positive for: Arrhythmia - a fib; Hypertension Negative for: Angina; Congestive Heart Failure; Coronary Artery Disease; Deep Vein Thrombosis; Hypotension; Myocardial Infarction; Peripheral Arterial Disease; Peripheral Venous Disease; Phlebitis; Vasculitis Past Medical History Notes: High Cholesterol Psychiatric Complaints and Symptoms: Negative for: Anxiety; Claustrophobia Eyes Medical History: Negative for: Cataracts; Glaucoma; Optic Neuritis Ear/Nose/Mouth/Throat Medical History: Negative for: Chronic sinus problems/congestion; Middle ear problems Hematologic/Lymphatic Medical History: Negative for: Anemia; Hemophilia; Human Immunodeficiency Virus; Lymphedema; Sickle Cell Disease Gastrointestinal PERSHING, SKIDMORE. ([id number]) Medical History: Negative for: Cirrhosis ; Colitis; Crohnos; Hepatitis A; Hepatitis B; Hepatitis C Endocrine Medical History: Positive for: Type II Diabetes Negative for: Type I Diabetes Time with diabetes: 3 years Treated with: Oral agents Blood sugar tested every day: No Genitourinary Medical History: Negative for: End Stage Renal Disease Immunological Medical History: Negative for: Lupus Erythematosus; Raynaudos; Scleroderma Integumentary (Skin) Medical History: Negative for: History of Burn; History of pressure wounds Musculoskeletal Medical History: Positive for: Osteoarthritis Negative for: Gout; Rheumatoid Arthritis; Osteomyelitis Neurologic Medical History: Negative for: Dementia; Neuropathy; Quadriplegia; Paraplegia; Seizure Disorder Oncologic Medical History: Negative for: Received Chemotherapy; Received Radiation Immunizations Pneumococcal Vaccine: Received Pneumococcal Vaccination: Yes Immunization Notes: up to date Implantable Devices No devices added Family and Social History Cancer: Yes - Siblings; Diabetes:  Yes - Father; Heart Disease: Yes - Mother; Hereditary Spherocytosis: No; Hypertension: Yes - Mother; Kidney Disease: No; Lung Disease: No; Seizures: No; Stroke: No; Thyroid Problems: No; Tuberculosis: No; Former smoker; Marital Status - Single; Alcohol Use: Never; Drug Use: No History; Caffeine Use: Moderate; Financial Concerns: No; Food, Clothing or Shelter Needs: No; Support System Lacking: No; Transportation Concerns: No LOPAKA, KARGE ([id number]) Physician Affirmation I have reviewed and agree with the above information. Electronic Signature(s) Signed: 08/16/2018 4:21:20 PM By: Harold Barban Signed: 08/17/2018 9:47:59 AM By: Worthy Keeler PA-C Entered By: Worthy Keeler on 08/15/2018 10:05:04 Chaves, [name] ([id number]) -------------------------------------------------------------------------------- SuperBill Details Patient Name: TAIMUR, FIER. Date of Service: 08/15/2018 Medical Record Number: [id number] Patient Account Number: [id number] Date of Birth/Sex: [birth year] (68 y.o. M) Treating RN: Harold Barban Primary Care Provider: Benita Stabile Other Clinician: Referring Provider: Benita Stabile Treating Provider/Extender:  STONE III, HOYT Weeks in Treatment: 7 Diagnosis Coding ICD-10 Codes Code Description E11.621 Type 2 diabetes mellitus with foot ulcer L97.512 Non-pressure chronic ulcer of other part of right foot with fat layer exposed I10 Essential (primary) hypertension I48.20 Chronic atrial fibrillation, unspecified Facility Procedures CPT4 Code: 65537482 Description: 70786 - DEB SUBQ TISSUE 20 SQ CM/< ICD-10 Diagnosis Description L97.512 Non-pressure chronic ulcer of other part of right foot with fa Modifier: t layer exposed Quantity: 1 Physician Procedures CPT4 Code: 7544920 Description: 11042 - WC PHYS SUBQ TISS 20 SQ CM ICD-10 Diagnosis Description L97.512 Non-pressure chronic ulcer of other part of right foot with fa Modifier: t layer exposed Quantity:  1 Electronic Signature(s) Signed: 08/15/2018 10:07:16 AM By: Worthy Keeler PA-C Entered By: Worthy Keeler on 08/15/2018 10:07:16

## 2018-08-16 NOTE — Progress Notes (Signed)
ZOLLIE, ELLERY ([id number]) Visit Report for 08/15/2018 Arrival Information Details Patient Name: [name], [name]. Date of Service: 08/15/2018 9:30 AM Medical Record Number: [id number] Patient Account Number: [id number] Date of Birth/Sex: 1950/04/08 (68 y.o. M) Treating RN: Harold Barban Primary Care Tonjia Parillo: Benita Stabile Other Clinician: Referring Isair Inabinet: Benita Stabile Treating Canon Gola/Extender: Melburn Hake, HOYT Weeks in Treatment: 7 Visit Information History Since Last Visit Added or deleted any medications: No Patient Arrived: Ambulatory Any new allergies or adverse reactions: No Arrival Time: 09:31 Had a fall or experienced change in No Accompanied By: girlfriend activities of daily living that may affect Transfer Assistance: None risk of falls: Patient Identification Verified: Yes Signs or symptoms of abuse/neglect since last visito No Secondary Verification Process Completed: Yes Hospitalized since last visit: No Implantable device outside of the clinic excluding No cellular tissue based products placed in the center since last visit: Has Dressing in Place as Prescribed: Yes Pain Present Now: No Electronic Signature(s) Signed: 08/15/2018 11:52:04 AM By: Lorine Bears RCP, RRT, CHT Entered By: Lorine Bears on 08/15/2018 09:32:03 Arata, [name] ([id number]) -------------------------------------------------------------------------------- Encounter Discharge Information Details Patient Name: [name], [name]. Date of Service: 08/15/2018 9:30 AM Medical Record Number: [id number] Patient Account Number: [id number] Date of Birth/Sex: Jun 15, 1950 (68 y.o. M) Treating RN: Harold Barban Primary Care Hanzel Pizzo: Benita Stabile Other Clinician: Referring Netanya Yazdani: Benita Stabile Treating Joushua Dugar/Extender: Melburn Hake, HOYT Weeks in Treatment: 7 Encounter Discharge Information Items Post Procedure Vitals Discharge Condition: Stable Temperature (F):  98.2 Ambulatory Status: Ambulatory Pulse (bpm): 118 Discharge Destination: Home Respiratory Rate (breaths/min): 16 Transportation: Private Auto Blood Pressure (mmHg): 125/86 Accompanied By: wife Schedule Follow-up Appointment: Yes Clinical Summary of Care: Electronic Signature(s) Signed: 08/16/2018 4:21:20 PM By: Harold Barban Entered By: Harold Barban on 08/15/2018 09:55:39 [name], [name] ([id number]) -------------------------------------------------------------------------------- Lower Extremity Assessment Details Patient Name: [name]. Date of Service: 08/15/2018 9:30 AM Medical Record Number: [id number] Patient Account Number: [id number] Date of Birth/Sex: [birth year] (68 y.o. M) Treating RN: Montey Hora Primary Care Unika Nazareno: Benita Stabile Other Clinician: Referring Rashana Andrew: Benita Stabile Treating Eagan Shifflett/Extender: STONE III, HOYT Weeks in Treatment: 7 Edema Assessment Assessed: [Left: No] [Right: No] Edema: [Left: N] [Right: o] Vascular Assessment Pulses: Dorsalis Pedis Palpable: [Right:Yes] Electronic Signature(s) Signed: 08/15/2018 4:17:28 PM By: Montey Hora Entered By: Montey Hora on 08/15/2018 09:36:12 [name], [name] ([id number]) -------------------------------------------------------------------------------- Multi Wound Chart Details Patient Name: [name]. Date of Service: 08/15/2018 9:30 AM Medical Record Number: [id number] Patient Account Number: [id number] Date of Birth/Sex: 02/02/1950 (68 y.o. M) Treating RN: Harold Barban Primary Care Tenita Cue: Benita Stabile Other Clinician: Referring Tadan Shill: Benita Stabile Treating Greely Atiyeh/Extender: STONE III, HOYT Weeks in Treatment: 7 Vital Signs Height(in): 74 Pulse(bpm): 118 Weight(lbs): 233 Blood Pressure(mmHg): 125/86 Body Mass Index(BMI): 30 Temperature(F): 98.2 Respiratory Rate 16 (breaths/min): Photos: [N/A:N/A] Wound Location: Right Foot - Plantar N/A N/A Wounding Event:  Pressure Injury N/A N/A Primary Etiology: Diabetic Wound/Ulcer of the N/A N/A Lower Extremity Secondary Etiology: Pressure Ulcer N/A N/A Comorbid History: Arrhythmia, Hypertension, N/A N/A Type II Diabetes, Osteoarthritis Date Acquired: 06/13/2018 N/A N/A Weeks of Treatment: 7 N/A N/A Wound Status: Open N/A N/A Measurements L x W x D 0.1x0.1x0.1 N/A N/A (cm) Area (cm) : 0.008 N/A N/A Volume (cm) : 0.001 N/A N/A % Reduction in Area: 99.80% N/A N/A % Reduction in Volume: 100.00% N/A N/A Classification: Grade 2 N/A N/A Exudate Amount: None Present N/A N/A Wound Margin: Indistinct, nonvisible N/A N/A Granulation Amount: None Present (0%) N/A N/A Necrotic Amount: None  Present (0%) N/A N/A Exposed Structures: Fascia: No N/A N/A Fat Layer (Subcutaneous Tissue) Exposed: No Tendon: No Muscle: No Joint: No [name], [name] ([id number]) Bone: No Limited to Skin Breakdown Epithelialization: Large (67-100%) N/A N/A Treatment Notes Electronic Signature(s) Signed: 08/16/2018 4:21:20 PM By: Harold Barban Entered By: Harold Barban on 08/15/2018 09:49:23 [name], [name] ([id number]) -------------------------------------------------------------------------------- Multi-Disciplinary Care Plan Details Patient Name: [name]. Date of Service: 08/15/2018 9:30 AM Medical Record Number: [id number] Patient Account Number: [id number] Date of Birth/Sex: Jun 11, 1950 (68 y.o. M) Treating RN: Harold Barban Primary Care Kierstynn Babich: Benita Stabile Other Clinician: Referring Melonee Gerstel: Benita Stabile Treating Jahnya Trindade/Extender: Melburn Hake, HOYT Weeks in Treatment: 7 Active Inactive Necrotic Tissue Nursing Diagnoses: Impaired tissue integrity related to necrotic/devitalized tissue Goals: Necrotic/devitalized tissue will be minimized in the wound bed Date Initiated: 06/21/2018 Target Resolution Date: 06/21/2018 Goal Status: Active Patient/caregiver will verbalize understanding of reason and process  for debridement of necrotic tissue Date Initiated: 06/21/2018 Target Resolution Date: 06/21/2018 Goal Status: Active Interventions: Assess patient pain level pre-, during and post procedure and prior to discharge Treatment Activities: Apply topical anesthetic as ordered : 06/21/2018 Notes: Orientation to the Wound Care Program Nursing Diagnoses: Knowledge deficit related to the wound healing center program Goals: Patient/caregiver will verbalize understanding of the [place] [facility] Program Date Initiated: 06/21/2018 Target Resolution Date: 06/21/2018 Goal Status: Active Interventions: Provide education on orientation to the wound center Notes: Pressure Nursing Diagnoses: Knowledge deficit related to management of pressures ulcers Goals: THADEUS, GANDOLFI ([id number]) Patient will remain free from development of additional pressure ulcers Date Initiated: 06/21/2018 Target Resolution Date: 06/29/2018 Goal Status: Active Interventions: Assess: immobility, friction, shearing, incontinence upon admission and as needed Provide education on pressure ulcers Notes: Wound/Skin Impairment Nursing Diagnoses: Impaired tissue integrity Goals: Ulcer/skin breakdown will have a volume reduction of 30% by week 4 Date Initiated: 06/21/2018 Target Resolution Date: 07/21/2018 Goal Status: Active Interventions: Assess patient/caregiver ability to perform ulcer/skin care regimen upon admission and as needed Assess ulceration(s) every visit Treatment Activities: Referred to DME Damen Windsor for dressing supplies : 06/21/2018 Skin care regimen initiated : 06/21/2018 Topical wound management initiated : 06/21/2018 Notes: Electronic Signature(s) Signed: 08/16/2018 4:21:20 PM By: Harold Barban Entered By: Harold Barban on 08/15/2018 09:49:04 [name], [name] ([id number]) -------------------------------------------------------------------------------- Pain Assessment Details Patient Name: [name]. Date  of Service: 08/15/2018 9:30 AM Medical Record Number: [id number] Patient Account Number: [id number] Date of Birth/Sex: 1950/04/08 (68 y.o. M) Treating RN: Harold Barban Primary Care Rien Marland: Benita Stabile Other Clinician: Referring Satvik Parco: Benita Stabile Treating Arli Bree/Extender: Melburn Hake, HOYT Weeks in Treatment: 7 Active Problems Location of Pain Severity and Description of Pain Patient Has Paino No Site Locations Pain Management and Medication Current Pain Management: Electronic Signature(s) Signed: 08/15/2018 11:52:04 AM By: Lorine Bears RCP, RRT, CHT Signed: 08/16/2018 4:21:20 PM By: Harold Barban Entered By: Lorine Bears on 08/15/2018 09:32:11 [name], [name] ([id number]) -------------------------------------------------------------------------------- Patient/Caregiver Education Details Patient Name: [name], [name]. Date of Service: 08/15/2018 9:30 AM Medical Record Number: [id number] Patient Account Number: [id number] Date of Birth/Gender: 03/27/1950 (68 y.o. M) Treating RN: Harold Barban Primary Care Physician: Benita Stabile Other Clinician: Referring Physician: Benita Stabile Treating Physician/Extender: Sharalyn Ink in Treatment: 7 Education Assessment Education Provided To: Patient Education Topics Provided Wound/Skin Impairment: Handouts: Caring for Your Ulcer Methods: Demonstration, Explain/Verbal Responses: State content correctly Electronic Signature(s) Signed: 08/16/2018 4:21:20 PM By: Harold Barban Entered By: Harold Barban on 08/15/2018 09:50:19 [name], [name] ([id number]) -------------------------------------------------------------------------------- Wound Assessment Details Patient  Name: [name], [name] Date of Service: 08/15/2018 9:30 AM Medical Record Number: [id number] Patient Account Number: [id number] Date of Birth/Sex: 11-01-1950 (68 y.o. M) Treating RN: Montey Hora Primary Care Siddalee Vanderheiden: Benita Stabile  Other Clinician: Referring Adine Heimann: Benita Stabile Treating Juan Olthoff/Extender: STONE III, HOYT Weeks in Treatment: 7 Wound Status Wound Number: 4 Primary Etiology: Diabetic Wound/Ulcer of the Lower Extremity Wound Location: Right Foot - Plantar Secondary Pressure Ulcer Wounding Event: Pressure Injury Etiology: Date Acquired: 06/13/2018 Wound Status: Open Weeks Of Treatment: 7 Comorbid Arrhythmia, Hypertension, Type II Diabetes, Clustered Wound: No History: Osteoarthritis Photos Wound Measurements Length: (cm) 0.1 % Reduction Width: (cm) 0.1 % Reduction Depth: (cm) 0.1 Epithelializ Area: (cm) 0.008 Tunneling: Volume: (cm) 0.001 Undermining in Area: 99.8% in Volume: 100% ation: Large (67-100%) No : No Wound Description Classification: Grade 2 Foul Odor A Wound Margin: Indistinct, nonvisible Slough/Fibr Exudate Amount: None Present fter Cleansing: No ino No Wound Bed Granulation Amount: None Present (0%) Exposed Structure Necrotic Amount: None Present (0%) Fascia Exposed: No Fat Layer (Subcutaneous Tissue) Exposed: No Tendon Exposed: No Muscle Exposed: No Joint Exposed: No Bone Exposed: No Limited to Skin Breakdown Treatment Notes Wound #4 (Right, Plantar Foot) [name], [name] J. ([id number]) Notes silvercell, telfa [place] dressing Electronic Signature(s) Signed: 08/15/2018 4:17:28 PM By: Montey Hora Entered By: Montey Hora on 08/15/2018 09:36:54 Hodgens, [name] ([id number]) -------------------------------------------------------------------------------- Vitals Details Patient Name: [name]. Date of Service: 08/15/2018 9:30 AM Medical Record Number: [id number] Patient Account Number: [id number] Date of Birth/Sex: 12-31-1950 (68 y.o. M) Treating RN: Harold Barban Primary Care Lurlene Ronda: Benita Stabile Other Clinician: Referring Antavious Spanos: Benita Stabile Treating Nyajah Hyson/Extender: STONE III, HOYT Weeks in Treatment: 7 Vital Signs Time Taken:  09:32 Temperature (F): 98.2 Height (in): 74 Pulse (bpm): 118 Weight (lbs): 233 Respiratory Rate (breaths/min): 16 Body Mass Index (BMI): 29.9 Blood Pressure (mmHg): 125/86 Reference Range: 80 - 120 mg / dl Electronic Signature(s) Signed: 08/15/2018 11:52:04 AM By: Lorine Bears RCP, RRT, CHT Entered By: Lorine Bears on 08/15/2018 09:34:31

## 2018-08-22 ENCOUNTER — Encounter: Payer: PPO | Attending: Physician Assistant | Admitting: Physician Assistant

## 2018-08-22 ENCOUNTER — Other Ambulatory Visit: Payer: Self-pay

## 2018-08-22 DIAGNOSIS — E11621 Type 2 diabetes mellitus with foot ulcer: Secondary | ICD-10-CM | POA: Diagnosis not present

## 2018-08-22 DIAGNOSIS — L97512 Non-pressure chronic ulcer of other part of right foot with fat layer exposed: Secondary | ICD-10-CM | POA: Insufficient documentation

## 2018-08-22 DIAGNOSIS — Z87891 Personal history of nicotine dependence: Secondary | ICD-10-CM | POA: Insufficient documentation

## 2018-08-22 DIAGNOSIS — L89899 Pressure ulcer of other site, unspecified stage: Secondary | ICD-10-CM | POA: Diagnosis not present

## 2018-08-22 DIAGNOSIS — I482 Chronic atrial fibrillation, unspecified: Secondary | ICD-10-CM | POA: Insufficient documentation

## 2018-08-22 DIAGNOSIS — Z794 Long term (current) use of insulin: Secondary | ICD-10-CM | POA: Diagnosis not present

## 2018-08-22 DIAGNOSIS — I10 Essential (primary) hypertension: Secondary | ICD-10-CM | POA: Diagnosis not present

## 2018-08-22 NOTE — Progress Notes (Addendum)
[name], [name] ([id number]) Visit Report for 08/22/2018 Arrival Information Details Patient Name: [name], [name]. Date of Service: 08/22/2018 10:30 AM Medical Record Number: [id number] Patient Account Number: [id number] Date of Birth/Sex: 06/20/1950 (68 y.o. M) Treating RN: [name] Primary Care [name]: [name] Other Clinician: Referring [name]: [name] Treating [name]/Extender: [name], [name] Weeks in Treatment: 8 Visit Information History Since Last Visit Added or deleted any medications: No Patient Arrived: Ambulatory Any new allergies or adverse reactions: No Arrival Time: 10:29 Had a fall or experienced change in No Accompanied By: self activities of daily living that may affect Transfer Assistance: None risk of falls: Signs or symptoms of abuse/neglect since last visito No Hospitalized since last visit: No Has Dressing in Place as Prescribed: Yes Pain Present Now: No Electronic Signature(s) Signed: 08/22/2018 1:41:30 PM By: [name] Entered By: [name] on 08/22/2018 10:30:12 [name], [name] ([id number]) -------------------------------------------------------------------------------- Clinic Level of Care Assessment Details Patient Name: [name]. Date of Service: 08/22/2018 10:30 AM Medical Record Number: [id number] Patient Account Number: [id number] Date of Birth/Sex: 1950/12/14 (68 y.o. M) Treating RN: [name] Primary Care [name]: [name] Other Clinician: Referring Keyuna Cuthrell: [name] Treating [name]/Extender: [name], [name] Weeks in Treatment: 8 Clinic Level of Care Assessment Items TOOL 4 Quantity Score []  - Use when only an EandM is performed on FOLLOW-UP visit 0 ASSESSMENTS - Nursing Assessment / Reassessment X - Reassessment of Co-morbidities (includes updates in patient status) 1 10 X- 1 5 Reassessment of Adherence to Treatment Plan ASSESSMENTS - Wound and Skin Assessment / Reassessment X - Simple Wound  Assessment / Reassessment - one wound 1 5 []  - 0 Complex Wound Assessment / Reassessment - multiple wounds []  - 0 Dermatologic / Skin Assessment (not related to wound area) ASSESSMENTS - Focused Assessment []  - Circumferential Edema Measurements - multi extremities 0 []  - 0 Nutritional Assessment / Counseling / Intervention []  - 0 Lower Extremity Assessment (monofilament, tuning fork, pulses) []  - 0 Peripheral Arterial Disease Assessment (using hand held doppler) ASSESSMENTS - Ostomy and/or Continence Assessment and Care []  - Incontinence Assessment and Management 0 []  - 0 Ostomy Care Assessment and Management (repouching, etc.) PROCESS - Coordination of Care X - Simple Patient / Family Education for ongoing care 1 15 []  - 0 Complex (extensive) Patient / Family Education for ongoing care []  - 0 Staff obtains Programmer, systems, Records, Test Results / Process Orders []  - 0 Staff telephones HHA, Nursing Homes / Clarify orders / etc []  - 0 Routine Transfer to another Facility (non-emergent condition) []  - 0 Routine Hospital Admission (non-emergent condition) []  - 0 New Admissions / Biomedical engineer / Ordering NPWT, Apligraf, etc. []  - 0 Emergency Hospital Admission (emergent condition) X- 1 10 Simple Discharge Coordination [name], [name]. ([id number]) []  - 0 Complex (extensive) Discharge Coordination PROCESS - Special Needs []  - Pediatric / Minor Patient Management 0 []  - 0 Isolation Patient Management []  - 0 Hearing / Language / Visual special needs []  - 0 Assessment of Community assistance (transportation, D/C planning, etc.) []  - 0 Additional assistance / Altered mentation []  - 0 Support Surface(s) Assessment (bed, cushion, seat, etc.) INTERVENTIONS - Wound Cleansing / Measurement X - Simple Wound Cleansing - one wound 1 5 []  - 0 Complex Wound Cleansing - multiple wounds X- 1 5 Wound Imaging (photographs - any number of wounds) []  - 0 Wound Tracing (instead of  photographs) X- 1 5 Simple Wound Measurement - one wound []  - 0 Complex Wound  Measurement - multiple wounds INTERVENTIONS - Wound Dressings X - Small Wound Dressing one or multiple wounds 1 10 []  - 0 Medium Wound Dressing one or multiple wounds []  - 0 Large Wound Dressing one or multiple wounds []  - 0 Application of Medications - topical []  - 0 Application of Medications - injection INTERVENTIONS - Miscellaneous []  - External ear exam 0 []  - 0 Specimen Collection (cultures, biopsies, blood, body fluids, etc.) []  - 0 Specimen(s) / Culture(s) sent or taken to Lab for analysis []  - 0 Patient Transfer (multiple staff / Civil Service fast streamer / Similar devices) []  - 0 Simple Staple / Suture removal (25 or less) []  - 0 Complex Staple / Suture removal (26 or more) []  - 0 Hypo / Hyperglycemic Management (close monitor of Blood Glucose) []  - 0 Ankle / Brachial Index (ABI) - do not check if billed separately X- 1 5 Vital Signs [name], [name] ([id number]) Has the patient been seen at the hospital within the last three years: Yes Total Score: 75 Level Of Care: New/Established - Level 2 Electronic Signature(s) Signed: 08/22/2018 4:11:30 PM By: [name] Entered By: [name] on 08/22/2018 11:01:37 [name], [name] ([id number]) -------------------------------------------------------------------------------- Encounter Discharge Information Details Patient Name: [name]. Date of Service: 08/22/2018 10:30 AM Medical Record Number: [id number] Patient Account Number: [id number] Date of Birth/Sex: [birth year] (68 y.o. M) Treating RN: [name] Primary Care [name]: [name] Other Clinician: Referring [name]: [name] Treating [name]/Extender: [name], [name] Weeks in Treatment: 8 Encounter Discharge Information Items Discharge Condition: Stable Ambulatory Status: Ambulatory Discharge Destination: Home Transportation: Private Auto Accompanied By: self Schedule  Follow-up Appointment: Yes Clinical Summary of Care: Electronic Signature(s) Signed: 08/22/2018 11:08:12 AM By: [name] Entered By: [name] on 08/22/2018 11:08:12 [name], [name] ([id number]) -------------------------------------------------------------------------------- Lower Extremity Assessment Details Patient Name: [name]. Date of Service: 08/22/2018 10:30 AM Medical Record Number: [id number] Patient Account Number: [id number] Date of Birth/Sex: [birth year] (68 y.o. M) Treating RN: [name] Primary Care Shantal Roan: [name] Other Clinician: Referring Arn Mcomber: [name] Treating Lasaro Primm/Extender: STONE III, [name] Weeks in Treatment: 8 Edema Assessment Assessed: [Left: No] [Right: No] Edema: [Left: N] [Right: o] Vascular Assessment Pulses: Dorsalis Pedis Palpable: [Right:Yes] Electronic Signature(s) Signed: 08/22/2018 1:41:30 PM By: [name] Entered By: [name] on 08/22/2018 10:33:38 [name], [name] ([id number]) -------------------------------------------------------------------------------- Multi Wound Chart Details Patient Name: [name]. Date of Service: 08/22/2018 10:30 AM Medical Record Number: [id number] Patient Account Number: [id number] Date of Birth/Sex: [birth year] (68 y.o. M) Treating RN: [name] Primary Care Bari Handshoe: [name] Other Clinician: Referring Tanaia Hawkey: [name] Treating Aylen Rambert/Extender: STONE III, [name] Weeks in Treatment: 8 Vital Signs Height(in): 74 Pulse(bpm): 126 Weight(lbs): 233 Blood Pressure(mmHg): 120/94 Body Mass Index(BMI): 30 Temperature(F): 97.6 Respiratory Rate 16 (breaths/min): Photos: [N/A:N/A] Wound Location: Right Foot - Plantar N/A N/A Wounding Event: Pressure Injury N/A N/A Primary Etiology: Diabetic Wound/Ulcer of the N/A N/A Lower Extremity Secondary Etiology: Pressure Ulcer N/A N/A Comorbid History: Arrhythmia, Hypertension, N/A N/A Type II  Diabetes, Osteoarthritis Date Acquired: 06/13/2018 N/A N/A Weeks of Treatment: 8 N/A N/A Wound Status: Open N/A N/A Measurements L x W x D 0.1x0.1x0.1 N/A N/A (cm) Area (cm) : 0.008 N/A N/A Volume (cm) : 0.001 N/A N/A % Reduction in Area: 99.80% N/A N/A % Reduction in Volume: 100.00% N/A N/A Classification: Grade 2 N/A N/A Exudate Amount: None Present N/A N/A Wound Margin: Indistinct, nonvisible N/A N/A Granulation Amount: None Present (0%) N/A N/A Necrotic Amount: None Present (0%)  N/A N/A Exposed Structures: Fascia: No N/A N/A Fat Layer (Subcutaneous Tissue) Exposed: No Tendon: No Muscle: No Joint: No [name], [name] ([id number]) Bone: No Limited to Skin Breakdown Epithelialization: Large (67-100%) N/A N/A Treatment Notes Electronic Signature(s) Signed: 08/22/2018 4:11:30 PM By: [name] Entered By: [name] on 08/22/2018 10:56:21 [name], [name] ([id number]) -------------------------------------------------------------------------------- Multi-Disciplinary Care Plan Details Patient Name: [name]. Date of Service: 08/22/2018 10:30 AM Medical Record Number: [id number] Patient Account Number: [id number] Date of Birth/Sex: 07-Oct-1950 (68 y.o. M) Treating RN: [name] Primary Care Breylin Dom: [name] Other Clinician: Referring Bellamia Ferch: [name] Treating Enos Muhl/Extender: [name], [name] Weeks in Treatment: 8 Active Inactive Electronic Signature(s) Signed: 08/24/2018 8:19:59 AM By: Gretta Cool, BSN, RN, CWS, Kim RN, BSN Signed: 08/24/2018 3:45:57 PM By: [name] Previous Signature: 08/22/2018 4:11:30 PM Version By: [name] Entered By: Gretta Cool BSN, RN, CWS, Kim on 08/24/2018 08:19:58 [name], [name] ([id number]) -------------------------------------------------------------------------------- Pain Assessment Details Patient Name: [name], [name]. Date of Service: 08/22/2018 10:30 AM Medical Record Number: [id number] Patient Account  Number: [id number] Date of Birth/Sex: 12-29-1950 (68 y.o. M) Treating RN: [name] Primary Care Jahnessa Vanduyn: [name] Other Clinician: Referring Dawne Casali: [name] Treating Tracker Mance/Extender: [name], [name] Weeks in Treatment: 8 Active Problems Location of Pain Severity and Description of Pain Patient Has Paino No Site Locations Pain Management and Medication Current Pain Management: Electronic Signature(s) Signed: 08/22/2018 1:41:30 PM By: [name] Entered By: [name] on 08/22/2018 10:30:18 [name], [name] ([id number]) -------------------------------------------------------------------------------- Patient/Caregiver Education Details Patient Name: [name] Date of Service: 08/22/2018 10:30 AM Medical Record Number: [id number] Patient Account Number: [id number] Date of Birth/Gender: 08/15/1950 (68 y.o. M) Treating RN: [name] Primary Care Physician: [name] Other Clinician: Referring Physician: Benita [name] Treating Physician/Extender: Sharalyn Ink in Treatment: 8 Education Assessment Education Provided To: Patient Education Topics Provided Pressure: Handouts: Pressure Ulcers: Care and Offloading Methods: Demonstration, Explain/Verbal Responses: State content correctly Wound/Skin Impairment: Handouts: Caring for Your Ulcer Methods: Demonstration, Explain/Verbal Responses: State content correctly Electronic Signature(s) Signed: 08/22/2018 4:11:30 PM By: [name] Entered By: [name] on 08/22/2018 10:56:47 [name], [name] ([id number]) -------------------------------------------------------------------------------- Wound Assessment Details Patient Name: [name]. Date of Service: 08/22/2018 10:30 AM Medical Record Number: [id number] Patient Account Number: [id number] Date of Birth/Sex: [birth year] (68 y.o. M) Treating RN: [name] Primary Care Brandolyn Shortridge: [name] Other Clinician: Referring Benen Weida: [name] Treating Kristin Lamagna/Extender: STONE III, [name] Weeks in Treatment: 8 Wound Status Wound Number: 4 Primary Etiology: Diabetic Wound/Ulcer of the Lower Extremity Wound Location: Right, Plantar Foot Secondary Pressure Ulcer Wounding Event: Pressure Injury Etiology: Date Acquired: 06/13/2018 Wound Status: Healed - Epithelialized Weeks Of Treatment: 8 Comorbid Arrhythmia, Hypertension, Type II Diabetes, Clustered Wound: No History: Osteoarthritis Photos Wound Measurements Length: (cm) 0 % Reduc Width: (cm) 0 % Reduc Depth: (cm) 0 Epithel Area: (cm) 0 Tunnel Volume: (cm) 0 Underm tion in Area: 100% tion in Volume: 100% ialization: Large (67-100%) ing: No ining: No Wound Description Classification: Grade 2 Foul O Wound Margin: Indistinct, nonvisible Slough Exudate Amount: None Present dor After Cleansing: No /Fibrino No Wound Bed Granulation Amount: None Present (0%) Exposed Structure Necrotic Amount: None Present (0%) Fascia Exposed: No Fat Layer (Subcutaneous Tissue) Exposed: No Tendon Exposed: No Muscle Exposed: No Joint Exposed: No Bone Exposed: No Limited to Skin Breakdown Electronic Signature(s) Signed: 08/22/2018 4:11:30 PM By: Stefani Dama ([id number]) Entered By: [name] on 08/22/2018 10:59:11 Pridmore, [name] ([id number]) -------------------------------------------------------------------------------- Vitals Details Patient  Name: Boissonneault, Teshaun J. Date of Service: 08/22/2018 10:30 AM Medical Record Number: [id number] Patient Account Number: [id number] Date of Birth/Sex: 1950-07-31 (68 y.o. M) Treating RN: [name] Primary Care Serene Kopf: [name] Other Clinician: Referring Aadhira Heffernan: TATE, Sharlet Salina Treating Romualdo Prosise/Extender: STONE III, [name] Weeks in Treatment: 8 Vital Signs Time Taken: 10:30 Temperature (F): 97.6 Height (in): 74 Pulse (bpm): 126 Weight (lbs): 233 Respiratory Rate (breaths/min): 16 Body Mass Index  (BMI): 29.9 Blood Pressure (mmHg): 120/94 Reference Range: 80 - 120 mg / dl Electronic Signature(s) Signed: 08/22/2018 1:41:30 PM By: [name] Entered By: [name] on 08/22/2018 10:30:46

## 2018-08-22 NOTE — Progress Notes (Addendum)
[name] ([id number]) Visit Report for 08/22/2018 Chief Complaint Document Details Patient Name: [name], [name]. Date of Service: 08/22/2018 10:30 AM Medical Record Number: [id number] Patient Account Number: [id number] Date of Birth/Sex: [birth year] (68 y.o. M) Treating RN: Harold Barban Primary Care Provider: Benita Stabile Other Clinician: Referring Provider: Benita Stabile Treating Provider/Extender: Melburn Hake, HOYT Weeks in Treatment: 8 Information Obtained from: Patient Chief Complaint Right plantar foot ulcer Electronic Signature(s) Signed: 08/22/2018 10:45:28 AM By: Worthy Keeler PA-C Entered By: Worthy Keeler on 08/22/2018 10:45:28 [name] ([id number]) -------------------------------------------------------------------------------- HPI Details Patient Name: [name]. Date of Service: 08/22/2018 10:30 AM Medical Record Number: [id number] Patient Account Number: [id number] Date of Birth/Sex: [birth year] (68 y.o. M) Treating RN: Harold Barban Primary Care Provider: Benita Stabile Other Clinician: Referring Provider: Benita Stabile Treating Provider/Extender: Melburn Hake, HOYT Weeks in Treatment: 8 History of Present Illness Associated Signs and Symptoms: Patient has a history of diabetes mellitus type II, hypertension, and atrial fibrillation. He also has a hammertoe deformity of the bilateral feet which seems to be causing pressure in the ball of his foot. HPI Description: 03/09/17 on evaluation today patient presents for his initial evaluation concerning an ulcer on the plantar aspect of his right foot which has been open he tells me for about three weeks. He has been seen at [facility] where they did perform debridement it appears according to a note on 02/22/17 where unfortunately it appears that his callous area began to break down into an ulcer after modifications have been added to his insulin. This had happened previously as well. However I do not have details  of the severity of debridement at that point although it does not sound as if you the debridement was too significant based on what the patient is telling me that he still had a lot of callous following debridement. With that being said they were going to look into altering his insoles to try to prevent further breakdown in the future. Nonetheless he has had foul odor discharge coming from the ulcer which is noted all the way back to that visit [facility]. Patient states that this was concerning him more than anything else. He does have diabetes although he described this as "borderline diabetes" he is on metoprolol however along with lisinopril. Patient is not having any issues with pain in regard to his right plantar foot. 03/16/17 on evaluation today patient appears to be doing much better in regard to his plantar foot ulcer. He actually tells me that he loves the peg assist offloading shoe and that he hasn't had any pain in the ulcer area from the callous since I worked on it last week. Overall he is extremely happy with how things have progressed. Likewise the wound bed has no slough noted there is no evidence of infection and it looks excellent. I did receive the results of his hemoglobin A1c which showed a value of 5.9 which was elevated but actually rather well. Subsequently I did also receive the x-ray of his foot which showed diffuse degenerative change but no underlying acute bony abnormality. 03/23/17 on evaluation today patient appears to be doing better in regard to his right plantar foot ulcer. He continues to show signs of improvement there's definitely not as much drainage at this point. He has been tolerating the dressing changes without complication he is not happy with the peg assist offloading shoe but at the same time I do believe that it  is making good progress as far as offloading is concerned. I still believe he may need to talk to a surgeon about surgically  correcting a hammertoe in order to avoid additional pressure to the site especially since he Artie has diabetic shoes and they do not seem to have prevented callous buildup in ulcer formation. 03/30/17 on evaluation today patient appears to be doing better in regard to his right plantar foot ulcer. Continues to show signs of improvement which is good news. Unfortunately we were unable to get the appointment with the orthopedic, Dr. Doran Durand, whom I recommended for him due to patient having a balance at the practice. With that being said patient's foot ulcer does seem to be doing much better on evaluation today he still has some depth to the wound but overall we are seeing improvement and epithelialization week by week. Hopefully this is something that will close shortly. 04/05/17 on evaluation today patient unfortunately has what appears to be an area of the plantar surface of his ulcer where he had fluid collection where the callous grew over top of the wound bed. Unfortunately there was some pus like material noted during cleansing that we did sent for culture today. Hopefully this is not truly an infection is or does not appear to be any evidence of erythema surrounding and maybe this is just simply a small setback with a fluid collection that has caused this issue. Nonetheless we will see what that shows when we get the culture back. I am also going to have it completely antibiotic which I previously placed him on anyway which will hopefully prevent any true infection from setting in. 04/12/17 on evaluation today patient's ulcer on the plantar aspect of his right foot actually appears to be better then during last weeks evaluation. In general he has been tolerating the dressing changes in utilizing the offloading shoe which does seem to be beneficial for him. With that being said he still seems to get some pressure to the area just not nearly as much as he previously had noted. Again fortunately  there is no significant pain. [name] ([id number]) 04/19/17 on evaluation today patient appears to be doing excellent in regard to his right foot ulcer. He has been tolerating the dressing changes without complication. There really has not been any drainage over the past few days he tells me. With that being said he seems to be doing excellent in my opinion at this point he does have some callous buildup. 04/26/17 On evaluation today patient's wound appears to be completely healed which is great news. He has been tolerating the dressing changes without complication and I do think he has done very well in regard to the healing process and offloading he has listed everything that that we instructed him to do. Obviously this appears to have paid off and he has progressed very nicely. Readmission: 07/05/17 on evaluation today patient is seen for fault evaluation and our clinic concerning the same issue that I have previously treated him for ending back in April 2019. This is a callous region with subsequently an ulcer underneath this on the plantar aspect of his right second metatarsal region. Fortunately he's not having any significant discomfort although his girlfriend told him that she noted an area of bruising at the site underneath the callous and wanted him to come get this checked out. He was very please with our care previous and therefore was more than happy to come let us check this for him.  Upon inspection initially he did have significant callous overlying the area in question. It was not easily identifiable as far as any ulcer was concerned. With that being said he with this significant callous did require some sharp debridement and following debridement there did appear to be a small ulcer underlying. Fortunately patient in general shows no signs of infection at this point. He again has neuropathy and therefore does not have any ongoing discomfort or pain at this point. 07/12/17 on  evaluation today patient actually appears to be doing very well in regard to his plantar foot ulcer. In fact this appears to be completely healed and there's no residual opening at this time. Overall I'm very pleased in this regard. Patient also is extremely pleased. Readmission: 02/08/18 patient presents today for follow-up evaluation he has previously been seen here in our office for the same issue. Fortunately there is not been any evidence of infection since I last saw him he does note that he is been going to see his local podiatrist for callous pairing. During that time they actually noted that he had an open wound and subsequently referred him back ties for evaluation of this wound. He has done very well in the past with good wound care often healing in just a few weeks. No fevers, chills, nausea, or vomiting noted at this time. 02/15/18 on evaluation today patient actually appears to be doing very well in regard to his plantar foot ulcer. He is been tolerating the dressing changes without complication. There does appear to be some eschar of the line the wound surface. Fortunately I am happy with how things are progressing from the standpoint of the wound progress from last week to this week. 02/22/18 on evaluation today patient appears to be doing very well in regard to his plantar foot ulcer. He did have callous buildup around the area that required some sharp debridement today. He tolerated the debridement without complication. Post debridement the wound bed appears to be doing much better there's just a very tiny opening still remaining. 03/01/18 on evaluation today patient appears to be doing excellent in regard to his plantar foot ulcer. In fact after I debrided away what appeared to be a dried blood clot on the surface and actually appear there was just a very small opening remaining in the central portion of the wound. This is literally a pinpoint region. Overall he has done well and I'm  very pleased in this regard. I think that likely one more week will see this area completely healed. 03/08/18 on evaluation today patient appears to actually be doing very well in regard to his plantar foot ulcer. He has been tolerating the dressing changes without complication. Fortunately this appears to be completely healed at this point which is excellent news. Overall I'm extremely happy with how things have progressed. 06/21/18 on evaluation today patient presents for evaluation in our clinic concerning issues that he's having with you seeing the area of his right foot that is previously experienced issues with. Fortunately there does not appear to be any evidence of infection at this time which is good news. With that being said he tells me that he has noted the callous buildup of the past several months but it was only in the past roughly week that he has noted drainage from the callous. He's not having any significant discomfort other than the fact that he states walking on the area with the extensive callous seems to be an issue. 06/28/18 on evaluation  today patient actually appears to be doing very well in regard to his plantar foot ulcer. He's been tolerating the dressing changes without complication. There is some callous buildup though not nearly as much as noted last [name], [name]. ([id number]) week. Subsequently there is some need for sharp debridement today. 07/05/18 on evaluation today patient appears to be doing much better in regard to his plantar foot ulcer. This is not completely healed yet but it is thinking this morning and appears better than last week's evaluation. He has been tolerating the dressing changes without complication. 07/12/18 on evaluation today patient's foot ulcer actually appears to be showing some progress but again is not completely healed at this point. He is gonna require some sharp debridement today unfortunately. 07/18/18 on evaluation today patient appears  to be doing about the same in regard to the plantar foot wound. Fortunately there's no signs of active infection. Unfortunately he hasn't made quite as much progress as I'd like to have seen. I do believe that he is doing better than obviously when he started but we still have some work to do. I think a total contact cast may be something to consider if he does not make a lot of significant improvement over the next week. With that being said the one issue is gonna be that he is done with a wound on his right foot which is going to cause some issues with driving if he has to have a cast the need to have somebody drive him. He's not sure that even has this capability. 07/26/18 on evaluation today patient appears to be doing better in regard to his plantar foot ulcer. He does have a little bit of a blood blister I believe that collagen is actually drying out and then getting stuck to the wound causing [place] to collect fluid underneath ugly this may be counterproductive. He states that he felt like he was actually doing somewhat better when he was utilizing the alginate as opposed to the collagen. 08/02/18-Patient returns at 1 week for evaluation of right plantar foot wound. He was just at the podiatry office where they removed some of the surrounding callus adjacent to the wound. The wound appears to be doing well and we are using silver cell at this time. 08/09/18 on evaluation today patient appears to be doing really about the same in regard to his foot ulcer. He's been tolerating the dressing changes without complication with that being said I really do not see any significant improvements and again I previously discussed with him that I do believe we need to get a total contact cast on his foot in order to get this to heal so that he doesn't have any further complications. He states that if things are not doing better by next week he's in agreement this plan. With that being said he will have have  someone drive him as again this is his right foot. He also has spoken with Dr. Amalia Hailey regarding surgery to correct the deformity of his foot which is causing the issue currently. Subsequently Dr. Amalia Hailey is going to consider reconstructive surgery but has to wait until the ulcer heals another good reason to get this healed as quickly as possible. 08/15/2018 on evaluation today patient actually appears to be doing better in regard to his plantar foot ulcer. We did give him another PEG assist offloading shoe last week and he seems to have done very well with this. I feel that this may have been the  issue with why he was not healing as rapidly as he had in the past. With that being said he is not completely healed at this point but it is much closer than has been in quite some time. Fortunately there is no signs of active infection at this time. 08/22/2018 on evaluation today patient's foot ulcer actually appears to be doing excellent in fact he appears to be completely healed based on what I am seeing at this time. There is no signs of active infection which is excellent news. No fevers, chills, nausea, vomiting, or diarrhea. Electronic Signature(s) Signed: 08/22/2018 6:04:35 PM By: Worthy Keeler PA-C Entered By: Worthy Keeler on 08/22/2018 18:04:35 [place], [name] ([id number]) -------------------------------------------------------------------------------- Physical Exam Details Patient Name: [name], [name]. Date of Service: 08/22/2018 10:30 AM Medical Record Number: [id number] Patient Account Number: [id number] Date of Birth/Sex: 1950-07-22 (68 y.o. M) Treating RN: Harold Barban Primary Care Provider: Benita Stabile Other Clinician: Referring Provider: TATE, Sharlet Salina Treating Provider/Extender: STONE III, HOYT Weeks in Treatment: 8 Constitutional Well-nourished and well-hydrated in no acute distress. Respiratory normal breathing without difficulty. Psychiatric this patient is able to make  decisions and demonstrates good insight into disease process. Alert and Oriented x 3. pleasant and cooperative. Notes Patient's wound bed I did check thoroughly to ensure that there was no evidence of opening still remaining. He does have some discoloration right at the point where the wound was but there is nothing actually open, draining, or prone to breaking down at this point that I can find. Overall he seems to be doing quite well. Electronic Signature(s) Signed: 08/22/2018 6:05:10 PM By: Worthy Keeler PA-C Entered By: Worthy Keeler on 08/22/2018 18:05:10 [place], [name] ([id number]) -------------------------------------------------------------------------------- Physician Orders Details Patient Name: [name], [name] Date of Service: 08/22/2018 10:30 AM Medical Record Number: [id number] Patient Account Number: [id number] Date of Birth/Sex: 10-Apr-1950 (68 y.o. M) Treating RN: Harold Barban Primary Care Provider: Benita Stabile Other Clinician: Referring Provider: Benita Stabile Treating Provider/Extender: Melburn Hake, HOYT Weeks in Treatment: 8 Verbal / Phone Orders: No Diagnosis Coding ICD-10 Coding Code Description E11.621 Type 2 diabetes mellitus with foot ulcer L97.512 Non-pressure chronic ulcer of other part of right foot with fat layer exposed I10 Essential (primary) hypertension I48.20 Chronic atrial fibrillation, unspecified Discharge From [facility] o Discharge from [place] - Please wear corn callous pads over wound daily and continue to wear surgical shoe until your visit to Dr. Amalia Hailey Electronic Signature(s) Signed: 08/22/2018 4:11:30 PM By: Harold Barban Signed: 08/22/2018 6:32:30 PM By: Worthy Keeler PA-C Entered By: Harold Barban on 08/22/2018 11:00:42 Olejnik, [name] ([id number]) -------------------------------------------------------------------------------- Problem List Details Patient Name: [name]. Date of Service: 08/22/2018 10:30  AM Medical Record Number: [id number] Patient Account Number: [id number] Date of Birth/Sex: November 18, 1950 (68 y.o. M) Treating RN: Harold Barban Primary Care Provider: Benita Stabile Other Clinician: Referring Provider: Benita Stabile Treating Provider/Extender: Melburn Hake, HOYT Weeks in Treatment: 8 Active Problems ICD-10 Evaluated Encounter Code Description Active Date Today Diagnosis E11.621 Type 2 diabetes mellitus with foot ulcer 06/21/2018 No Yes L97.512 Non-pressure chronic ulcer of other part of right foot with fat 06/21/2018 No Yes layer exposed I10 Essential (primary) hypertension 06/21/2018 No Yes I48.20 Chronic atrial fibrillation, unspecified 06/21/2018 No Yes Inactive Problems Resolved Problems Electronic Signature(s) Signed: 08/22/2018 10:45:21 AM By: Worthy Keeler PA-C Entered By: Worthy Keeler on 08/22/2018 10:45:21 [place], [name] ([id number]) -------------------------------------------------------------------------------- Progress Note Details Patient Name: [name]. Date of Service: 08/22/2018 10:30  AM Medical Record Number: [id number] Patient Account Number: [id number] Date of Birth/Sex: 1950-05-08 (68 y.o. M) Treating RN: Harold Barban Primary Care Provider: Benita Stabile Other Clinician: Referring Provider: Benita Stabile Treating Provider/Extender: Melburn Hake, HOYT Weeks in Treatment: 8 Subjective Chief Complaint Information obtained from Patient Right plantar foot ulcer History of Present Illness (HPI) The following HPI elements were documented for the patient's wound: Associated Signs and Symptoms: Patient has a history of diabetes mellitus type II, hypertension, and atrial fibrillation. He also has a hammertoe deformity of the bilateral feet which seems to be causing pressure in the ball of his foot. 03/09/17 on evaluation today patient presents for his initial evaluation concerning an ulcer on the plantar aspect of his right foot which has been open he tells me  for about three weeks. He has been seen at [facility] where they did perform debridement it appears according to a note on 02/22/17 where unfortunately it appears that his callous area began to break down into an ulcer after modifications have been added to his insulin. This had happened previously as well. However I do not have details of the severity of debridement at that point although it does not sound as if you the debridement was too significant based on what the patient is telling me that he still had a lot of callous following debridement. With that being said they were going to look into altering his insoles to try to prevent further breakdown in the future. Nonetheless he has had foul odor discharge coming from the ulcer which is noted all the way back to that visit on [facility]. Patient states that this was concerning him more than anything else. He does have diabetes although he described this as "borderline diabetes" he is on metoprolol however along with lisinopril. Patient is not having any issues with pain in regard to his right plantar foot. 03/16/17 on evaluation today patient appears to be doing much better in regard to his plantar foot ulcer. He actually tells me that he loves the peg assist offloading shoe and that he hasn't had any pain in the ulcer area from the callous since I worked on it last week. Overall he is extremely happy with how things have progressed. Likewise the wound bed has no slough noted there is no evidence of infection and it looks excellent. I did receive the results of his hemoglobin A1c which showed a value of 5.9 which was elevated but actually rather well. Subsequently I did also receive the x-ray of his foot which showed diffuse degenerative change but no underlying acute bony abnormality. 03/23/17 on evaluation today patient appears to be doing better in regard to his right plantar foot ulcer. He continues to show signs of  improvement there's definitely not as much drainage at this point. He has been tolerating the dressing changes without complication he is not happy with the peg assist offloading shoe but at the same time I do believe that it is making good progress as far as offloading is concerned. I still believe he may need to talk to a surgeon about surgically correcting a hammertoe in order to avoid additional pressure to the site especially since he Artie has diabetic shoes and they do not seem to have prevented callous buildup in ulcer formation. 03/30/17 on evaluation today patient appears to be doing better in regard to his right plantar foot ulcer. Continues to show signs of improvement which is good news. Unfortunately we were unable  to get the appointment with the orthopedic, Dr. Doran Durand, whom I recommended for him due to patient having a balance at the practice. With that being said patient's foot ulcer does seem to be doing much better on evaluation today he still has some depth to the wound but overall we are seeing improvement and epithelialization week by week. Hopefully this is something that will close shortly. 04/05/17 on evaluation today patient unfortunately has what appears to be an area of the plantar surface of his ulcer where he had fluid collection where the callous grew over top of the wound bed. Unfortunately there was some pus like material noted during cleansing that we did sent for culture today. Hopefully this is not truly an infection is or does not appear to be any [name], [name]. ([id number]) evidence of erythema surrounding and maybe this is just simply a small setback with a fluid collection that has caused this issue. Nonetheless we will see what that shows when we get the culture back. I am also going to have it completely antibiotic which I previously placed him on anyway which will hopefully prevent any true infection from setting in. 04/12/17 on evaluation today patient's  ulcer on the plantar aspect of his right foot actually appears to be better then during last weeks evaluation. In general he has been tolerating the dressing changes in utilizing the offloading shoe which does seem to be beneficial for him. With that being said he still seems to get some pressure to the area just not nearly as much as he previously had noted. Again fortunately there is no significant pain. 04/19/17 on evaluation today patient appears to be doing excellent in regard to his right foot ulcer. He has been tolerating the dressing changes without complication. There really has not been any drainage over the past few days he tells me. With that being said he seems to be doing excellent in my opinion at this point he does have some callous buildup. 04/26/17 On evaluation today patient's wound appears to be completely healed which is great news. He has been tolerating the dressing changes without complication and I do think he has done very well in regard to the healing process and offloading he has listed everything that that we instructed him to do. Obviously this appears to have paid off and he has progressed very nicely. Readmission: 07/05/17 on evaluation today patient is seen for fault evaluation and our clinic concerning the same issue that I have previously treated him for ending back in April 2019. This is a callous region with subsequently an ulcer underneath this on the plantar aspect of his right second metatarsal region. Fortunately he's not having any significant discomfort although his girlfriend told him that she noted an area of bruising at the site underneath the callous and wanted him to come get this checked out. He was very please with our care previous and therefore was more than happy to come let us check this for him. Upon inspection initially he did have significant callous overlying the area in question. It was not easily identifiable as far as any ulcer was concerned.  With that being said he with this significant callous did require some sharp debridement and following debridement there did appear to be a small ulcer underlying. Fortunately patient in general shows no signs of infection at this point. He again has neuropathy and therefore does not have any ongoing discomfort or pain at this point. 07/12/17 on evaluation today patient actually appears  to be doing very well in regard to his plantar foot ulcer. In fact this appears to be completely healed and there's no residual opening at this time. Overall I'm very pleased in this regard. Patient also is extremely pleased. Readmission: 02/08/18 patient presents today for follow-up evaluation he has previously been seen here in our office for the same issue. Fortunately there is not been any evidence of infection since I last saw him he does note that he is been going to see his local podiatrist for callous pairing. During that time they actually noted that he had an open wound and subsequently referred him back ties for evaluation of this wound. He has done very well in the past with good wound care often healing in just a few weeks. No fevers, chills, nausea, or vomiting noted at this time. 02/15/18 on evaluation today patient actually appears to be doing very well in regard to his plantar foot ulcer. He is been tolerating the dressing changes without complication. There does appear to be some eschar of the line the wound surface. Fortunately I am happy with how things are progressing from the standpoint of the wound progress from last week to this week. 02/22/18 on evaluation today patient appears to be doing very well in regard to his plantar foot ulcer. He did have callous buildup around the area that required some sharp debridement today. He tolerated the debridement without complication. Post debridement the wound bed appears to be doing much better there's just a very tiny opening still remaining. 03/01/18 on  evaluation today patient appears to be doing excellent in regard to his plantar foot ulcer. In fact after I debrided away what appeared to be a dried blood clot on the surface and actually appear there was just a very small opening remaining in the central portion of the wound. This is literally a pinpoint region. Overall he has done well and I'm very pleased in this regard. I think that likely one more week will see this area completely healed. 03/08/18 on evaluation today patient appears to actually be doing very well in regard to his plantar foot ulcer. He has been tolerating the dressing changes without complication. Fortunately this appears to be completely healed at this point which is excellent news. Overall I'm extremely happy with how things have progressed. [name], [name] ([id number]) 06/21/18 on evaluation today patient presents for evaluation in our clinic concerning issues that he's having with you seeing the area of his right foot that is previously experienced issues with. Fortunately there does not appear to be any evidence of infection at this time which is good news. With that being said he tells me that he has noted the callous buildup of the past several months but it was only in the past roughly week that he has noted drainage from the callous. He's not having any significant discomfort other than the fact that he states walking on the area with the extensive callous seems to be an issue. 06/28/18 on evaluation today patient actually appears to be doing very well in regard to his plantar foot ulcer. He's been tolerating the dressing changes without complication. There is some callous buildup though not nearly as much as noted last week. Subsequently there is some need for sharp debridement today. 07/05/18 on evaluation today patient appears to be doing much better in regard to his plantar foot ulcer. This is not completely healed yet but it is thinking this morning and appears better  than last week's  evaluation. He has been tolerating the dressing changes without complication. 07/12/18 on evaluation today patient's foot ulcer actually appears to be showing some progress but again is not completely healed at this point. He is gonna require some sharp debridement today unfortunately. 07/18/18 on evaluation today patient appears to be doing about the same in regard to the plantar foot wound. Fortunately there's no signs of active infection. Unfortunately he hasn't made quite as much progress as I'd like to have seen. I do believe that he is doing better than obviously when he started but we still have some work to do. I think a total contact cast may be something to consider if he does not make a lot of significant improvement over the next week. With that being said the one issue is gonna be that he is done with a wound on his right foot which is going to cause some issues with driving if he has to have a cast the need to have somebody drive him. He's not sure that even has this capability. 07/26/18 on evaluation today patient appears to be doing better in regard to his plantar foot ulcer. He does have a little bit of a blood blister I believe that collagen is actually drying out and then getting stuck to the wound causing [place] to collect fluid underneath ugly this may be counterproductive. He states that he felt like he was actually doing somewhat better when he was utilizing the alginate as opposed to the collagen. 08/02/18-Patient returns at 1 week for evaluation of right plantar foot wound. He was just at the podiatry office where they removed some of the surrounding callus adjacent to the wound. The wound appears to be doing well and we are using silver cell at this time. 08/09/18 on evaluation today patient appears to be doing really about the same in regard to his foot ulcer. He's been tolerating the dressing changes without complication with that being said I really do not see  any significant improvements and again I previously discussed with him that I do believe we need to get a total contact cast on his foot in order to get this to heal so that he doesn't have any further complications. He states that if things are not doing better by next week he's in agreement this plan. With that being said he will have have someone drive him as again this is his right foot. He also has spoken with Dr. Amalia Hailey regarding surgery to correct the deformity of his foot which is causing the issue currently. Subsequently Dr. Amalia Hailey is going to consider reconstructive surgery but has to wait until the ulcer heals another good reason to get this healed as quickly as possible. 08/15/2018 on evaluation today patient actually appears to be doing better in regard to his plantar foot ulcer. We did give him another PEG assist offloading shoe last week and he seems to have done very well with this. I feel that this may have been the issue with why he was not healing as rapidly as he had in the past. With that being said he is not completely healed at this point but it is much closer than has been in quite some time. Fortunately there is no signs of active infection at this time. 08/22/2018 on evaluation today patient's foot ulcer actually appears to be doing excellent in fact he appears to be completely healed based on what I am seeing at this time. There is no signs of active infection which  is excellent news. No fevers, chills, nausea, vomiting, or diarrhea. Patient History Information obtained from Patient. Family History Cancer - Siblings, Diabetes - Father, Heart Disease - Mother, Hypertension - Mother, No family history of Hereditary Spherocytosis, Kidney Disease, Lung Disease, Seizures, Stroke, Thyroid Problems, Tuberculosis. 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 Medical  History Eyes Denies history of Cataracts, Glaucoma, Optic Neuritis Ear/Nose/Mouth/Throat Denies history of Chronic sinus problems/congestion, Middle ear problems Hematologic/Lymphatic Denies history of Anemia, Hemophilia, Human Immunodeficiency Virus, Lymphedema, Sickle Cell Disease Respiratory Denies history of Aspiration, Asthma, Chronic Obstructive Pulmonary Disease (COPD), Pneumothorax, Sleep Apnea, Tuberculosis Cardiovascular Patient has history of Arrhythmia - a fib, Hypertension Denies history of Angina, Congestive Heart Failure, Coronary Artery Disease, Deep Vein Thrombosis, Hypotension, Myocardial Infarction, Peripheral Arterial Disease, Peripheral Venous Disease, Phlebitis, Vasculitis Gastrointestinal Denies history of Cirrhosis , Colitis, Crohn s, Hepatitis A, Hepatitis B, Hepatitis C Endocrine Patient has history of Type II Diabetes Denies history of Type I Diabetes Genitourinary Denies history of End Stage Renal Disease Immunological Denies history of Lupus Erythematosus, Raynaud s, Scleroderma Integumentary (Skin) Denies history of History of Burn, History of pressure wounds Musculoskeletal Patient has history of Osteoarthritis Denies history of Gout, Rheumatoid Arthritis, Osteomyelitis Neurologic Denies history of Dementia, Neuropathy, Quadriplegia, Paraplegia, Seizure Disorder Oncologic Denies history of Received Chemotherapy, Received Radiation Medical And Surgical History Notes Cardiovascular High Cholesterol Review of Systems (ROS) Constitutional Symptoms (General Health) Denies complaints or symptoms of Fatigue, Fever, Chills, Marked Weight Change. Respiratory Denies complaints or symptoms of Chronic or frequent coughs, Shortness of Breath. Cardiovascular Denies complaints or symptoms of Chest pain, LE edema. Psychiatric Denies complaints or symptoms of Anxiety, Claustrophobia. Objective [name], [name]. ([id number]) Constitutional Well-nourished and  well-hydrated in no acute distress. Vitals Time Taken: 10:30 AM, Height: 74 in, Weight: 233 lbs, BMI: 29.9, Temperature: 97.6 F, Pulse: 126 bpm, Respiratory Rate: 16 breaths/min, Blood Pressure: 120/94 mmHg. Respiratory normal breathing without difficulty. Psychiatric this patient is able to make decisions and demonstrates good insight into disease process. Alert and Oriented x 3. pleasant and cooperative. General Notes: Patient's wound bed I did check thoroughly to ensure that there was no evidence of opening still remaining. He does have some discoloration right at the point where the wound was but there is nothing actually open, draining, or prone to breaking down at this point that I can find. Overall he seems to be doing quite well. Integumentary (Hair, Skin) Wound #4 status is Healed - Epithelialized. Original cause of wound was Pressure Injury. The wound is located on the [place]. The wound measures 0cm length x 0cm width x 0cm depth; 0cm^2 area and 0cm^3 volume. The wound is limited to skin breakdown. There is no tunneling or undermining noted. There is a none present amount of drainage noted. The wound margin is indistinct and nonvisible. There is no granulation within the wound bed. There is no necrotic tissue within the wound bed. Assessment Active Problems ICD-10 Type 2 diabetes mellitus with foot ulcer Non-pressure chronic ulcer of other part of right foot with fat layer exposed Essential (primary) hypertension Chronic atrial fibrillation, unspecified Plan Discharge From [facility] Services: Discharge from [place] - Please wear corn callous pads over wound daily and continue to wear surgical shoe until your visit to Dr. Amalia Hailey 1. My suggestion since the patient is healed is good to be that we discontinue wound care services as of  today. 2. I did suggest that he continue to wear the offloading shoe which is a peg assist offloading shoe for the next 2-4  weeks. 3. He is also scheduled to see Dr. Amalia Hailey to discuss reconstructive surgery for his foot that is in the next couple weeks as well. We will see what Dr. Amalia Hailey can do for him. [name], [name] ([id number]) If anything changes or worsens in the meantime he will contact the office and let us know otherwise we will see him back on an as-needed basis at this point. Electronic Signature(s) Signed: 08/22/2018 6:05:54 PM By: Worthy Keeler PA-C Entered By: Worthy Keeler on 08/22/2018 18:05:53 Zou, [name] ([id number]) -------------------------------------------------------------------------------- ROS/PFSH Details Patient Name: [name] Date of Service: 08/22/2018 10:30 AM Medical Record Number: [id number] Patient Account Number: [id number] Date of Birth/Sex: 1950/11/24 (68 y.o. M) Treating RN: Harold Barban Primary Care Provider: Benita Stabile Other Clinician: Referring Provider: Benita Stabile Treating Provider/Extender: STONE III, HOYT Weeks in Treatment: 8 Information Obtained From Patient Constitutional Symptoms (General Health) Complaints and Symptoms: Negative for: Fatigue; Fever; Chills; Marked Weight Change Respiratory Complaints and Symptoms: Negative for: Chronic or frequent coughs; Shortness of Breath Medical History: Negative for: Aspiration; Asthma; Chronic Obstructive Pulmonary Disease (COPD); Pneumothorax; Sleep Apnea; Tuberculosis Cardiovascular Complaints and Symptoms: Negative for: Chest pain; LE edema Medical History: Positive for: Arrhythmia - a fib; Hypertension Negative for: Angina; Congestive Heart Failure; Coronary Artery Disease; Deep Vein Thrombosis; Hypotension; Myocardial Infarction; Peripheral Arterial Disease; Peripheral Venous Disease; Phlebitis; Vasculitis Past Medical History Notes: High Cholesterol Psychiatric Complaints and Symptoms: Negative for: Anxiety; Claustrophobia Eyes Medical History: Negative for: Cataracts; Glaucoma; Optic  Neuritis Ear/Nose/Mouth/Throat Medical History: Negative for: Chronic sinus problems/congestion; Middle ear problems Hematologic/Lymphatic Medical History: Negative for: Anemia; Hemophilia; Human Immunodeficiency Virus; Lymphedema; Sickle Cell Disease Gastrointestinal [name], [name]. ([id number]) Medical History: Negative for: Cirrhosis ; Colitis; Crohnos; Hepatitis A; Hepatitis B; Hepatitis C Endocrine Medical History: Positive for: Type II Diabetes Negative for: Type I Diabetes Time with diabetes: 3 years Treated with: Oral agents Blood sugar tested every day: No Genitourinary Medical History: Negative for: End Stage Renal Disease Immunological Medical History: Negative for: Lupus Erythematosus; Raynaudos; Scleroderma Integumentary (Skin) Medical History: Negative for: History of Burn; History of pressure wounds Musculoskeletal Medical History: Positive for: Osteoarthritis Negative for: Gout; Rheumatoid Arthritis; Osteomyelitis Neurologic Medical History: Negative for: Dementia; Neuropathy; Quadriplegia; Paraplegia; Seizure Disorder Oncologic Medical History: Negative for: Received Chemotherapy; Received Radiation Immunizations Pneumococcal Vaccine: Received Pneumococcal Vaccination: Yes Immunization Notes: up to date Implantable Devices No devices added Family and Social History Cancer: Yes - Siblings; Diabetes: Yes - Father; Heart Disease: Yes - Mother; Hereditary Spherocytosis: No; Hypertension: Yes - Mother; Kidney Disease: No; Lung Disease: No; Seizures: No; Stroke: No; Thyroid Problems: No; Tuberculosis: No; Former smoker; Marital Status - Single; Alcohol Use: Never; Drug Use: No History; Caffeine Use: Moderate; Financial Concerns: No; Food, Clothing or Shelter Needs: No; Support System Lacking: No; Transportation Concerns: No [name], [name] ([id number]) Physician Affirmation I have reviewed and agree with the above information. Electronic  Signature(s) Signed: 08/22/2018 6:32:30 PM By: Worthy Keeler PA-C Signed: 08/23/2018 3:07:27 PM By: Harold Barban Entered By: Worthy Keeler on 08/22/2018 18:04:57 [name], [name] ([id number]) -------------------------------------------------------------------------------- SuperBill Details Patient Name: HATTIE, AGUINALDO. Date of Service: 08/22/2018 Medical Record Number: [id number] Patient Account Number: [id number] Date of Birth/Sex: 1950-12-31 (68 y.o. M) Treating RN: Harold Barban Primary Care Provider: Benita Stabile Other Clinician: Referring Provider: Benita Stabile Treating Provider/Extender: Melburn Hake, HOYT  Weeks in Treatment: 8 Diagnosis Coding ICD-10 Codes Code Description E11.621 Type 2 diabetes mellitus with foot ulcer L97.512 Non-pressure chronic ulcer of other part of right foot with fat layer exposed I10 Essential (primary) hypertension I48.20 Chronic atrial fibrillation, unspecified Facility Procedures CPT4 Code: 61848592 Description: [phone] - WOUND CARE VISIT-LEV 2 EST PT Modifier: Quantity: 1 Physician Procedures CPT4 Code: 3200379 Description: 44461 - WC PHYS LEVEL 3 - EST PT ICD-10 Diagnosis Description E11.621 Type 2 diabetes mellitus with foot ulcer L97.512 Non-pressure chronic ulcer of other part of right foot with fa I10 Essential (primary) hypertension I48.20 Chronic atrial  fibrillation, unspecified Modifier: t layer exposed Quantity: 1 Electronic Signature(s) Signed: 08/22/2018 6:06:11 PM By: Worthy Keeler PA-C Entered By: Worthy Keeler on 08/22/2018 18:06:11

## 2018-08-30 ENCOUNTER — Ambulatory Visit: Payer: PPO | Admitting: Podiatry

## 2018-08-30 ENCOUNTER — Encounter: Payer: Self-pay | Admitting: Podiatry

## 2018-08-30 ENCOUNTER — Other Ambulatory Visit: Payer: Self-pay

## 2018-08-30 VITALS — Temp 98.1°F

## 2018-08-30 DIAGNOSIS — E0843 Diabetes mellitus due to underlying condition with diabetic autonomic (poly)neuropathy: Secondary | ICD-10-CM | POA: Diagnosis not present

## 2018-08-30 DIAGNOSIS — M2041 Other hammer toe(s) (acquired), right foot: Secondary | ICD-10-CM

## 2018-08-30 DIAGNOSIS — L97512 Non-pressure chronic ulcer of other part of right foot with fat layer exposed: Secondary | ICD-10-CM

## 2018-08-30 NOTE — Patient Instructions (Signed)
Pre-Operative Instructions  Congratulations, you have decided to take an important step towards improving your quality of life.  You can be assured that the doctors and staff at [facility] will be with you every step of the way.  Here are some important things you should know:  1. Plan to be at the surgery center/hospital at least 1 (one) hour prior to your scheduled time, unless otherwise directed by the surgical center/hospital staff.  You must have a responsible adult accompany you, remain during the surgery and drive you home.  Make sure you have directions to the surgical center/hospital to ensure you arrive on time. 2. If you are having surgery at [facility] or  hospitals, you will need a copy of your medical history and physical form from your family physician within one month prior to the date of surgery. We will give you a form for your primary physician to complete.  3. We make every effort to accommodate the date you request for surgery.  However, there are times where surgery dates or times have to be moved.  We will contact you as soon as possible if a change in schedule is required.   4. No aspirin/ibuprofen for one week before surgery.  If you are on aspirin, any non-steroidal anti-inflammatory medications (Mobic, Aleve, Ibuprofen) should not be taken seven (7) days prior to your surgery.  You make take Tylenol for pain prior to surgery.  5. Medications - If you are taking daily heart and blood pressure medications, seizure, reflux, allergy, asthma, anxiety, pain or diabetes medications, make sure you notify the surgery center/hospital before the day of surgery so they can tell you which medications you should take or avoid the day of surgery. 6. No food or drink after midnight the night before surgery unless directed otherwise by surgical center/hospital staff. 7. No alcoholic beverages 24-hours prior to surgery.  No smoking 24-hours prior or 24-hours after  surgery. 8. Wear loose pants or shorts. They should be loose enough to fit over bandages, boots, and casts. 9. Don't wear slip-on shoes. Sneakers are preferred. 10. Bring your boot with you to the surgery center/hospital.  Also bring crutches or a walker if your physician has prescribed it for you.  If you do not have this equipment, it will be provided for you after surgery. 11. If you have not been contacted by the surgery center/hospital by the day before your surgery, call to confirm the date and time of your surgery. 12. Leave-time from work may vary depending on the type of surgery you have.  Appropriate arrangements should be made prior to surgery with your employer. 13. Prescriptions will be provided immediately following surgery by your doctor.  Fill these as soon as possible after surgery and take the medication as directed. Pain medications will not be refilled on weekends and must be approved by the doctor. 14. Remove nail polish on the operative foot and avoid getting pedicures prior to surgery. 15. Wash the night before surgery.  The night before surgery wash the foot and leg well with water and the antibacterial soap provided. Be sure to pay special attention to beneath the toenails and in between the toes.  Wash for at least three (3) minutes. Rinse thoroughly with water and dry well with a towel.  Perform this wash unless told not to do so by your physician.  Enclosed: 1 Ice pack (please put in freezer the night before surgery)   1 Hibiclens skin cleaner     Pre-op instructions  If you have any questions regarding the instructions, please do not hesitate to call our office.  [place]: [address], [place], [place] [address] -- [phone]  [place]: [address] [place], [place] [address] -- [phone]  [place]: [address]  [place], [place] [address] -- [phone]  [place]: [address], [address], [place], [place] [address] -- [phone]  Website:  [email or web address] 

## 2018-08-31 ENCOUNTER — Telehealth: Payer: Self-pay | Admitting: *Deleted

## 2018-08-31 NOTE — Progress Notes (Signed)
   HPI: 67 year old male presenting today for follow up evaluation of an ulceration of the right sub-second MPJ. He states he is doing well and the wound is healing appropriately. He has been seeing [facility] and using the post op shoe as directed. There are no modifying factors noted. Patient is here for further evaluation and treatment.   Past Medical History:  Diagnosis Date  . AF (atrial fibrillation) ([place])   . Cardiomyopathy ([place])   . CHF (congestive heart failure) ([place])   . Diabetes mellitus without complication ([place])   . Hypertension   . Serum lipids high       Objective: Physical Exam General: The patient is alert and oriented x3 in no acute distress.  Dermatology: Wound #1 noted to the right sub-second MPJ measuring 0.5 x 0.5 x 0.1 cm.   To the above-noted ulceration, there is no eschar. There is a moderate amount of slough, fibrin and necrotic tissue. Granulation tissue and wound base is red. There is no malodor. There is a minimal amount of serosanginous drainage noted. Periwound integrity is intact.  Skin is cool, dry and supple bilateral lower extremities.   Vascular: Palpable pedal pulses bilaterally. No edema or erythema noted. Capillary refill within normal limits.  Neurological: Epicritic and protective threshold grossly intact bilaterally.   Musculoskeletal Exam: All pedal and ankle joints range of motion within normal limits bilateral. Muscle strength 5/5 in all groups bilateral. Hammertoe contracture deformity noted to the 2nd digit of the right foot.  Assessment: 1. 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 Patient evaluated. 2. Medically necessary excisional debridement including subcutaneous tissue was performed using a tissue nipper and a chisel blade. Excisional debridement of all the necrotic nonviable tissue down to healthy bleeding viable  tissue was performed with post-debridement measurements same as pre-. 3. The wound was cleansed and dry sterile dressing applied. 4. Today we discussed the conservative versus surgical management of the presenting pathology. The patient opts for surgical management. All possible complications and details of the procedure were explained. All patient questions were answered. No guarantees were expressed or implied. 5. Authorization for surgery was initiated today. Surgery will consist of 2nd metatarsal head resection right.  6. Continue using post op shoe.  7. Return to clinic in 2 weeks for routine debridement.     Edrick Kins, DPM [facility]  Dr. Edrick Kins, DPM    [address], [place] [address]                Office [phone]  Fax [phone]

## 2018-08-31 NOTE — Telephone Encounter (Signed)
I'm returning your call.  You want to schedule your surgery?  "Yes, he said it would be about four weeks out."  His next available date is October 13, 2018.  "Okay, put me down for then.  What time will it be?"  I can't give you a time. Someone from the surgical center will call you a day or two days prior to your surgery date and will give you your arrival time.

## 2018-09-13 ENCOUNTER — Ambulatory Visit: Payer: PPO | Admitting: Podiatry

## 2018-09-13 ENCOUNTER — Encounter: Payer: Self-pay | Admitting: Podiatry

## 2018-09-13 ENCOUNTER — Other Ambulatory Visit: Payer: Self-pay

## 2018-09-13 VITALS — Temp 97.1°F

## 2018-09-13 DIAGNOSIS — E0843 Diabetes mellitus due to underlying condition with diabetic autonomic (poly)neuropathy: Secondary | ICD-10-CM | POA: Diagnosis not present

## 2018-09-13 DIAGNOSIS — L97512 Non-pressure chronic ulcer of other part of right foot with fat layer exposed: Secondary | ICD-10-CM | POA: Diagnosis not present

## 2018-09-13 DIAGNOSIS — M2041 Other hammer toe(s) (acquired), right foot: Secondary | ICD-10-CM

## 2018-09-13 NOTE — Patient Instructions (Signed)
Pre-Operative Instructions  Congratulations, you have decided to take an important step towards improving your quality of life.  You can be assured that the doctors and staff at [facility] will be with you every step of the way.  Here are some important things you should know:  1. Plan to be at the surgery center/hospital at least 1 (one) hour prior to your scheduled time, unless otherwise directed by the surgical center/hospital staff.  You must have a responsible adult accompany you, remain during the surgery and drive you home.  Make sure you have directions to the surgical center/hospital to ensure you arrive on time. 2. If you are having surgery at [facility] or [facility] hospitals, you will need a copy of your medical history and physical form from your family physician within one month prior to the date of surgery. We will give you a form for your primary physician to complete.  3. We make every effort to accommodate the date you request for surgery.  However, there are times where surgery dates or times have to be moved.  We will contact you as soon as possible if a change in schedule is required.   4. No aspirin/ibuprofen for one week before surgery.  If you are on aspirin, any non-steroidal anti-inflammatory medications (Mobic, Aleve, Ibuprofen) should not be taken seven (7) days prior to your surgery.  You make take Tylenol for pain prior to surgery.  5. Medications - If you are taking daily heart and blood pressure medications, seizure, reflux, allergy, asthma, anxiety, pain or diabetes medications, make sure you notify the surgery center/hospital before the day of surgery so they can tell you which medications you should take or avoid the day of surgery. 6. No food or drink after midnight the night before surgery unless directed otherwise by surgical center/hospital staff. 7. No alcoholic beverages 24-hours prior to surgery.  No smoking 24-hours prior or 24-hours after  surgery. 8. Wear loose pants or shorts. They should be loose enough to fit over bandages, boots, and casts. 9. Don't wear slip-on shoes. Sneakers are preferred. 10. Bring your boot with you to the surgery center/hospital.  Also bring crutches or a walker if your physician has prescribed it for you.  If you do not have this equipment, it will be provided for you after surgery. 11. If you have not been contacted by the surgery center/hospital by the day before your surgery, call to confirm the date and time of your surgery. 12. Leave-time from work may vary depending on the type of surgery you have.  Appropriate arrangements should be made prior to surgery with your employer. 13. Prescriptions will be provided immediately following surgery by your doctor.  Fill these as soon as possible after surgery and take the medication as directed. Pain medications will not be refilled on weekends and must be approved by the doctor. 14. Remove nail polish on the operative foot and avoid getting pedicures prior to surgery. 15. Wash the night before surgery.  The night before surgery wash the foot and leg well with water and the antibacterial soap provided. Be sure to pay special attention to beneath the toenails and in between the toes.  Wash for at least three (3) minutes. Rinse thoroughly with water and dry well with a towel.  Perform this wash unless told not to do so by your physician.  Enclosed: 1 Ice pack (please put in freezer the night before surgery)   1 Hibiclens skin cleaner     Pre-op instructions  If you have any questions regarding the instructions, please do not hesitate to call our office.  [place]: [address], [place], [place] [address] -- [phone]  [place]: [address] , [place] [address] -- [phone]  [place]: [address]  [place], [place] [address] -- [phone]  [place]: [address], [address], [place], [place] [address] -- [phone]  Website:  [email or web address] 

## 2018-09-15 NOTE — Progress Notes (Signed)
   HPI: 68 year old male presenting today for follow up evaluation of an ulceration of the right sub-second MPJ. He states he is doing well and the wound is healing appropriately. He has been using the [place] as directed. There are no worsening factors noted. Patient is here for further evaluation and treatment.   Past Medical History:  Diagnosis Date  . AF (atrial fibrillation) ([place])   . Cardiomyopathy ([place])   . CHF (congestive heart failure) ([place])   . Diabetes mellitus without complication ([place])   . Hypertension   . Serum lipids high       Objective: Physical Exam General: The patient is alert and oriented x3 in no acute distress.  Dermatology: Wound #1 noted to the right sub-second MPJ measuring 1.0 x 1.0 x 0.2 cm.   To the above-noted ulceration, there is no eschar. There is a moderate amount of slough, fibrin and necrotic tissue. Granulation tissue and wound base is red. There is no malodor. There is a minimal amount of serosanginous drainage noted. Periwound integrity is intact.  Skin is cool, dry and supple bilateral lower extremities.   Vascular: Palpable pedal pulses bilaterally. No edema or erythema noted. Capillary refill within normal limits.  Neurological: Epicritic and protective threshold grossly intact bilaterally.   Musculoskeletal Exam: All pedal and ankle joints range of motion within normal limits bilateral. Muscle strength 5/5 in all groups bilateral. Hammertoe contracture deformity noted to the 2nd digit of the right foot.  Assessment: 1. 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 Patient evaluated. 2. Medically necessary excisional debridement including subcutaneous tissue was performed using a tissue nipper and a chisel blade. Excisional debridement of all the necrotic nonviable tissue down to healthy bleeding viable tissue was performed with  post-debridement measurements same as pre-. 3. The wound was cleansed and dry sterile dressing applied. 4. Surgery scheduled for 10/13/2018.  5. Continue using Silvercell daily with a dry sterile dressing.  6. Return to clinic in 2 weeks for routine debridement.      Edrick Kins, DPM [facility]  Dr. Edrick Kins, DPM    [address], [place] [address]                Office [phone]  Fax [phone]

## 2018-09-27 ENCOUNTER — Other Ambulatory Visit: Payer: Self-pay

## 2018-09-27 ENCOUNTER — Encounter: Payer: Self-pay | Admitting: Podiatry

## 2018-09-27 ENCOUNTER — Ambulatory Visit: Payer: PPO | Admitting: Podiatry

## 2018-09-27 DIAGNOSIS — L97512 Non-pressure chronic ulcer of other part of right foot with fat layer exposed: Secondary | ICD-10-CM

## 2018-09-27 DIAGNOSIS — E0843 Diabetes mellitus due to underlying condition with diabetic autonomic (poly)neuropathy: Secondary | ICD-10-CM

## 2018-09-27 DIAGNOSIS — M2041 Other hammer toe(s) (acquired), right foot: Secondary | ICD-10-CM

## 2018-09-29 NOTE — Progress Notes (Signed)
   HPI: 68 year old male presenting today for follow up evaluation of an ulceration of the right sub-second MPJ as well as a hammertoe. He states he is doing well and the wound is healing appropriately. He has been using the [place] as directed. There are no worsening factors noted and he denies any significant changes. He is scheduled for surgery on 10/13/2018. Patient is here for further evaluation and treatment.   Past Medical History:  Diagnosis Date  . AF (atrial fibrillation) ([place])   . Cardiomyopathy ([place])   . CHF (congestive heart failure) ([place])   . Diabetes mellitus without complication ([place])   . Hypertension   . Serum lipids high       Objective: Physical Exam General: The patient is alert and oriented x3 in no acute distress.  Dermatology: Wound #1 noted to the right sub-second MPJ measuring 1.0 x 1.0 x 0.2 cm.   To the above-noted ulceration, there is no eschar. There is a moderate amount of slough, fibrin and necrotic tissue. Granulation tissue and wound base is red. There is no malodor. There is a minimal amount of serosanginous drainage noted. Periwound integrity is intact.  Skin is cool, dry and supple bilateral lower extremities.   Vascular: Palpable pedal pulses bilaterally. No edema or erythema noted. Capillary refill within normal limits.  Neurological: Epicritic and protective threshold grossly intact bilaterally.   Musculoskeletal Exam: All pedal and ankle joints range of motion within normal limits bilateral. Muscle strength 5/5 in all groups bilateral. Hammertoe contracture deformity noted to the 2nd digit of the right foot.  Assessment: 1. 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 Patient evaluated. 2. Medically necessary excisional debridement including subcutaneous tissue was performed using a tissue nipper and a chisel blade. Excisional debridement of  all the necrotic nonviable tissue down to healthy bleeding viable tissue was performed with post-debridement measurements same as pre-. 3. The wound was cleansed and dry sterile dressing applied. 4. Surgery scheduled for 10/13/2018.  5. Continue using Silvercell daily with a dry sterile dressing.  6. Return to clinic one week post op.      Edrick Kins, DPM [facility]  Dr. Edrick Kins, DPM    [address], [place] [address]                Office [phone]  Fax [phone]

## 2018-10-07 ENCOUNTER — Telehealth: Payer: Self-pay | Admitting: Podiatry

## 2018-10-07 NOTE — Telephone Encounter (Signed)
I am returning your call.  How can I help you?  "I've already taken care of it.  The lady from the surgery center called me and answered my questions.  I was worried that I wasn't scheduled but she took down all the information that she needed.  Thank you for calling me back."

## 2018-10-07 NOTE — Telephone Encounter (Signed)
-----   Message from Clarene Reamer sent at 10/07/2018  3:58 PM EDT ----- Regarding: Surgery Information needed Ms. [name],  Please call patient in regards to surgery. They have questions.

## 2018-10-10 ENCOUNTER — Telehealth: Payer: Self-pay | Admitting: *Deleted

## 2018-10-10 NOTE — Telephone Encounter (Signed)
DOS 10/13/2018 OSTECTOMY COMPLETE METATARSAL OSTEOTOMY 2ND RT - 28112  HTA: Eligibility Date - 01/19/2018  The surgery was authorized.  The authorization # is [id number].  It is valid from 10/13/2018 to 01/11/2019.

## 2018-10-13 ENCOUNTER — Encounter: Payer: Self-pay | Admitting: Podiatry

## 2018-10-13 ENCOUNTER — Other Ambulatory Visit: Payer: Self-pay | Admitting: Podiatry

## 2018-10-13 DIAGNOSIS — M2041 Other hammer toe(s) (acquired), right foot: Secondary | ICD-10-CM | POA: Diagnosis not present

## 2018-10-13 DIAGNOSIS — L97512 Non-pressure chronic ulcer of other part of right foot with fat layer exposed: Secondary | ICD-10-CM | POA: Diagnosis not present

## 2018-10-13 DIAGNOSIS — I10 Essential (primary) hypertension: Secondary | ICD-10-CM | POA: Diagnosis not present

## 2018-10-13 MED ORDER — OXYCODONE-ACETAMINOPHEN 5-325 MG PO TABS: 1.0000 | ORAL_TABLET | Freq: Four times a day (QID) | ORAL | 0 refills | Status: DC | PRN

## 2018-10-13 NOTE — Progress Notes (Signed)
.  postop

## 2018-10-21 ENCOUNTER — Ambulatory Visit (INDEPENDENT_AMBULATORY_CARE_PROVIDER_SITE_OTHER): Payer: Self-pay | Admitting: Podiatry

## 2018-10-21 ENCOUNTER — Other Ambulatory Visit: Payer: Self-pay

## 2018-10-21 ENCOUNTER — Ambulatory Visit (INDEPENDENT_AMBULATORY_CARE_PROVIDER_SITE_OTHER): Payer: PPO

## 2018-10-21 DIAGNOSIS — M2041 Other hammer toe(s) (acquired), right foot: Secondary | ICD-10-CM

## 2018-10-21 DIAGNOSIS — Z9889 Other specified postprocedural states: Secondary | ICD-10-CM

## 2018-10-21 MED ORDER — DOXYCYCLINE HYCLATE 100 MG PO TABS: 100.0000 mg | ORAL_TABLET | Freq: Two times a day (BID) | ORAL | 0 refills | Status: DC

## 2018-10-24 ENCOUNTER — Ambulatory Visit: Payer: PPO | Admitting: Podiatry

## 2018-10-26 NOTE — Progress Notes (Signed)
   Subjective:  Patient presents today status post right 2nd metatarsal head resection. DOS: 10/13/2018. He states he is doing well. He has been using the post op shoe as directed. There are no modifying factors noted. Patient is here for further evaluation and treatment.    Past Medical History:  Diagnosis Date  . AF (atrial fibrillation) ([name])   . Cardiomyopathy ([name])   . CHF (congestive heart failure) ([name])   . Diabetes mellitus without complication ([name])   . Hypertension   . Serum lipids high       Objective/Physical Exam Neurovascular status intact.  Skin incisions appear to be well coapted with sutures and staples intact. No sign of infectious process noted. No dehiscence. No active bleeding noted. Moderate edema noted to the surgical extremity.  Radiographic Exam:  Orthopedic hardware and osteotomies sites appear to be stable with routine healing.  Assessment: 1. s/p right 2nd metatarsal head resection. DOS: 10/13/2018   Plan of Care:  1. Patient was evaluated. X-rays reviewed 2. Dressing changed. Keep clean, dry and intact for one week.  3. Continue weightbearing in post op shoe.  4. Prescription for Doxycycline 100 mg #20 provided to patient.  5. Return to clinic in one week.    Edrick Kins, DPM [facility]  Dr. Edrick Kins, [address]                                        [name], [name] [address]                Office [phone]  Fax [phone]

## 2018-10-28 ENCOUNTER — Other Ambulatory Visit: Payer: PPO

## 2018-10-28 ENCOUNTER — Ambulatory Visit (INDEPENDENT_AMBULATORY_CARE_PROVIDER_SITE_OTHER): Payer: PPO | Admitting: Podiatry

## 2018-10-28 ENCOUNTER — Encounter: Payer: Self-pay | Admitting: Podiatry

## 2018-10-28 ENCOUNTER — Other Ambulatory Visit: Payer: Self-pay

## 2018-10-28 DIAGNOSIS — Z9889 Other specified postprocedural states: Secondary | ICD-10-CM

## 2018-10-28 DIAGNOSIS — M2041 Other hammer toe(s) (acquired), right foot: Secondary | ICD-10-CM

## 2018-10-31 NOTE — Progress Notes (Signed)
   Subjective:  Patient presents today status post right 2nd metatarsal head resection. DOS: 10/13/2018. He reports some erythema and edema to the dorsal right foot. He has been taking the Doxycycline as directed. He denies worsening factors. Patient is here for further evaluation and treatment.    Past Medical History:  Diagnosis Date  . AF (atrial fibrillation) ([place])   . Cardiomyopathy ([place])   . CHF (congestive heart failure) ([place])   . Diabetes mellitus without complication ([place])   . Hypertension   . Serum lipids high       Objective/Physical Exam Neurovascular status intact.  Skin incisions appear to be well coapted with sutures and staples intact. No sign of infectious process noted. No dehiscence. No active bleeding noted. Moderate edema noted to the surgical extremity.   Assessment: 1. s/p right 2nd metatarsal head resection. DOS: 10/13/2018   Plan of Care:  1. Patient was evaluated.  2. Dressing changed.  3. Continue weightbearing in post op shoe.  4. Finish taking oral Doxycycline as prescribed.  5. Return to clinic in one week for staple removal.     Edrick Kins, DPM [facility]  Dr. Edrick Kins, [address]                                        [place], [place] [address]                Office [phone]  Fax [phone]

## 2018-11-04 ENCOUNTER — Ambulatory Visit (INDEPENDENT_AMBULATORY_CARE_PROVIDER_SITE_OTHER): Payer: Self-pay | Admitting: Podiatry

## 2018-11-04 ENCOUNTER — Ambulatory Visit: Payer: PPO

## 2018-11-04 ENCOUNTER — Other Ambulatory Visit: Payer: Self-pay

## 2018-11-04 ENCOUNTER — Encounter: Payer: Self-pay | Admitting: Podiatry

## 2018-11-04 VITALS — Temp 97.8°F

## 2018-11-04 DIAGNOSIS — Z9889 Other specified postprocedural states: Secondary | ICD-10-CM

## 2018-11-04 DIAGNOSIS — M2041 Other hammer toe(s) (acquired), right foot: Secondary | ICD-10-CM

## 2018-11-07 NOTE — Progress Notes (Signed)
   Subjective:  Patient presents today status post right 2nd metatarsal head resection. DOS: 10/13/2018. He reports redness and swelling of the foot. He has been using the post op shoe as directed. He denies any modifying factors. Patient is here for further evaluation and treatment.    Past Medical History:  Diagnosis Date  . AF (atrial fibrillation) ([place])   . Cardiomyopathy ([place])   . CHF (congestive heart failure) ([place])   . Diabetes mellitus without complication ([place])   . Hypertension   . Serum lipids high       Objective/Physical Exam Neurovascular status intact.  Skin incisions appear to be well coapted with sutures and staples intact. No sign of infectious process noted. No dehiscence. No active bleeding noted. Moderate edema noted to the surgical extremity.   Assessment: 1. s/p right 2nd metatarsal head resection. DOS: 10/13/2018   Plan of Care:  1. Patient was evaluated.  2. Staples removed.  3. Recommended using iodine ointment to incision site daily.  4. Continue using post op shoe.  5. Ace wrap dispensed.  6. Return to clinic in 2 weeks.   Wants to get in a deer stand October 31st.      Edrick Kins, DPM [facility]  Dr. Edrick Kins, [address]                                        [place], [place] [address]                Office [phone]  Fax [phone]

## 2018-11-14 DIAGNOSIS — E038 Other specified hypothyroidism: Secondary | ICD-10-CM | POA: Diagnosis not present

## 2018-11-14 DIAGNOSIS — R634 Abnormal weight loss: Secondary | ICD-10-CM | POA: Diagnosis not present

## 2018-11-14 DIAGNOSIS — E119 Type 2 diabetes mellitus without complications: Secondary | ICD-10-CM | POA: Diagnosis not present

## 2018-11-14 DIAGNOSIS — R251 Tremor, unspecified: Secondary | ICD-10-CM | POA: Diagnosis not present

## 2018-11-18 ENCOUNTER — Other Ambulatory Visit: Payer: Self-pay

## 2018-11-18 ENCOUNTER — Ambulatory Visit (INDEPENDENT_AMBULATORY_CARE_PROVIDER_SITE_OTHER): Payer: PPO

## 2018-11-18 ENCOUNTER — Ambulatory Visit (INDEPENDENT_AMBULATORY_CARE_PROVIDER_SITE_OTHER): Payer: Self-pay | Admitting: Podiatry

## 2018-11-18 DIAGNOSIS — I519 Heart disease, unspecified: Secondary | ICD-10-CM | POA: Diagnosis not present

## 2018-11-18 DIAGNOSIS — I340 Nonrheumatic mitral (valve) insufficiency: Secondary | ICD-10-CM | POA: Diagnosis not present

## 2018-11-18 DIAGNOSIS — I361 Nonrheumatic tricuspid (valve) insufficiency: Secondary | ICD-10-CM | POA: Diagnosis not present

## 2018-11-18 DIAGNOSIS — E039 Hypothyroidism, unspecified: Secondary | ICD-10-CM | POA: Diagnosis not present

## 2018-11-18 DIAGNOSIS — M2041 Other hammer toe(s) (acquired), right foot: Secondary | ICD-10-CM | POA: Diagnosis not present

## 2018-11-18 DIAGNOSIS — I5022 Chronic systolic (congestive) heart failure: Secondary | ICD-10-CM | POA: Diagnosis not present

## 2018-11-18 DIAGNOSIS — R251 Tremor, unspecified: Secondary | ICD-10-CM | POA: Diagnosis not present

## 2018-11-18 DIAGNOSIS — Z9889 Other specified postprocedural states: Secondary | ICD-10-CM

## 2018-11-18 DIAGNOSIS — I428 Other cardiomyopathies: Secondary | ICD-10-CM | POA: Diagnosis not present

## 2018-11-18 DIAGNOSIS — I480 Paroxysmal atrial fibrillation: Secondary | ICD-10-CM | POA: Diagnosis not present

## 2018-11-18 DIAGNOSIS — R634 Abnormal weight loss: Secondary | ICD-10-CM | POA: Diagnosis not present

## 2018-11-18 DIAGNOSIS — E119 Type 2 diabetes mellitus without complications: Secondary | ICD-10-CM | POA: Diagnosis not present

## 2018-11-18 MED ORDER — DOXYCYCLINE HYCLATE 100 MG PO TABS: 100.0000 mg | ORAL_TABLET | Freq: Two times a day (BID) | ORAL | 0 refills | Status: DC

## 2018-11-22 NOTE — Progress Notes (Signed)
   Subjective:  Patient presents today status post right 2nd metatarsal head resection. DOS: 10/13/2018. He states he is doing well. He reports some redness on the dorsal foot. He has been applying iodine ointment and using the post op shoe as directed. Patient is here for further evaluation and treatment.    Past Medical History:  Diagnosis Date  . AF (atrial fibrillation) ([place])   . Cardiomyopathy ([place])   . CHF (congestive heart failure) ([place])   . Diabetes mellitus without complication ([place])   . Hypertension   . Serum lipids high       Objective/Physical Exam Neurovascular status intact.  Skin incisions appear to be well coapted. Small amount of purulence noted from the incision site. No dehiscence. No active bleeding noted. Moderate edema noted to the surgical extremity.  Radiographic Exam:  Osteotomies sites appear to be stable with routine healing.   Assessment: 1. s/p right 2nd metatarsal head resection. DOS: 10/13/2018   Plan of Care:  1. Patient was evaluated. X-Rays reviewed.  2. Dressing changed.  3. Prescription for Doxycycline provided to patient.  4. Continue using post op shoe.  5. Return to clinic in 2 weeks.    Wants to get in a deer stand October 31st.      Edrick Kins, DPM [facility]  Dr. Edrick Kins, [address] [place]                                        [place], [place] [address]                Office [phone]  Fax [phone]

## 2018-11-24 DIAGNOSIS — M310 Hypersensitivity angiitis: Secondary | ICD-10-CM | POA: Diagnosis not present

## 2018-12-08 DIAGNOSIS — M310 Hypersensitivity angiitis: Secondary | ICD-10-CM | POA: Diagnosis not present

## 2018-12-09 ENCOUNTER — Ambulatory Visit (INDEPENDENT_AMBULATORY_CARE_PROVIDER_SITE_OTHER): Payer: PPO

## 2018-12-09 ENCOUNTER — Ambulatory Visit (INDEPENDENT_AMBULATORY_CARE_PROVIDER_SITE_OTHER): Payer: PPO | Admitting: Podiatry

## 2018-12-09 ENCOUNTER — Other Ambulatory Visit: Payer: Self-pay

## 2018-12-09 ENCOUNTER — Encounter: Payer: PPO | Admitting: Podiatry

## 2018-12-09 DIAGNOSIS — M2041 Other hammer toe(s) (acquired), right foot: Secondary | ICD-10-CM | POA: Diagnosis not present

## 2018-12-09 DIAGNOSIS — Z9889 Other specified postprocedural states: Secondary | ICD-10-CM

## 2018-12-18 NOTE — Progress Notes (Signed)
   Subjective:  Patient presents today status post right 2nd metatarsal head resection. DOS: 10/13/2018. He states he is doing well. He denies any pain or modifying factors. He has been using the post op shoe as directed and has completed his Doxycycline. Patient is here for further evaluation and treatment.    Past Medical History:  Diagnosis Date  . AF (atrial fibrillation) ([place])   . Cardiomyopathy ([place])   . CHF (congestive heart failure) ([place])   . Diabetes mellitus without complication ([place])   . Hypertension   . Serum lipids high       Objective/Physical Exam Neurovascular status intact. Skin incisions appear to be well coapted. No sign of infectious process noted. No dehiscence. No active bleeding noted. Moderate edema noted to the surgical extremity.  Radiographic Exam:  Osteotomies sites appear to be stable with routine healing.  Assessment: 1. s/p right 2nd metatarsal head resection. DOS: 10/13/2018   Plan of Care:  1. Patient was evaluated. X-Rays reviewed.  2. Recommended good shoe gear.  3. May resume full activity with no restrictions.  4. Return to clinic as needed.     Edrick Kins, DPM [facility]  Dr. Edrick Kins, [address]                                        [place], [place] [address]                Office [phone]  Fax [phone]

## 2018-12-20 DIAGNOSIS — K469 Unspecified abdominal hernia without obstruction or gangrene: Secondary | ICD-10-CM | POA: Diagnosis not present

## 2018-12-20 DIAGNOSIS — R634 Abnormal weight loss: Secondary | ICD-10-CM | POA: Diagnosis not present

## 2018-12-20 DIAGNOSIS — L959 Vasculitis limited to the skin, unspecified: Secondary | ICD-10-CM | POA: Diagnosis not present

## 2018-12-20 DIAGNOSIS — D649 Anemia, unspecified: Secondary | ICD-10-CM | POA: Diagnosis not present

## 2018-12-29 DIAGNOSIS — R2242 Localized swelling, mass and lump, left lower limb: Secondary | ICD-10-CM | POA: Diagnosis not present

## 2018-12-29 DIAGNOSIS — Z1159 Encounter for screening for other viral diseases: Secondary | ICD-10-CM | POA: Diagnosis not present

## 2018-12-29 DIAGNOSIS — I776 Arteritis, unspecified: Secondary | ICD-10-CM | POA: Diagnosis not present

## 2019-01-06 ENCOUNTER — Inpatient Hospital Stay: Payer: PPO | Attending: Oncology | Admitting: Oncology

## 2019-01-06 ENCOUNTER — Inpatient Hospital Stay: Payer: PPO

## 2019-01-06 ENCOUNTER — Other Ambulatory Visit: Payer: Self-pay

## 2019-01-06 VITALS — BP 126/73 | HR 115 | Temp 97.0°F | Resp 18 | Wt 247.2 lb

## 2019-01-06 DIAGNOSIS — Z7984 Long term (current) use of oral hypoglycemic drugs: Secondary | ICD-10-CM | POA: Insufficient documentation

## 2019-01-06 DIAGNOSIS — Z79899 Other long term (current) drug therapy: Secondary | ICD-10-CM | POA: Insufficient documentation

## 2019-01-06 DIAGNOSIS — Z87891 Personal history of nicotine dependence: Secondary | ICD-10-CM | POA: Diagnosis not present

## 2019-01-06 DIAGNOSIS — D649 Anemia, unspecified: Secondary | ICD-10-CM | POA: Diagnosis not present

## 2019-01-06 DIAGNOSIS — Z7982 Long term (current) use of aspirin: Secondary | ICD-10-CM | POA: Insufficient documentation

## 2019-01-06 DIAGNOSIS — D472 Monoclonal gammopathy: Secondary | ICD-10-CM | POA: Insufficient documentation

## 2019-01-06 DIAGNOSIS — N289 Disorder of kidney and ureter, unspecified: Secondary | ICD-10-CM | POA: Insufficient documentation

## 2019-01-06 DIAGNOSIS — I4891 Unspecified atrial fibrillation: Secondary | ICD-10-CM | POA: Diagnosis not present

## 2019-01-06 DIAGNOSIS — E119 Type 2 diabetes mellitus without complications: Secondary | ICD-10-CM | POA: Diagnosis not present

## 2019-01-06 DIAGNOSIS — I10 Essential (primary) hypertension: Secondary | ICD-10-CM | POA: Insufficient documentation

## 2019-01-06 DIAGNOSIS — D696 Thrombocytopenia, unspecified: Secondary | ICD-10-CM | POA: Insufficient documentation

## 2019-01-06 LAB — BASIC METABOLIC PANEL
Anion gap: 12 (ref 5–15)
BUN: 21 mg/dL (ref 8–23)
CO2: 21 mmol/L — ABNORMAL LOW (ref 22–32)
Calcium: 9 mg/dL (ref 8.9–10.3)
Chloride: 103 mmol/L (ref 98–111)
Creatinine, Ser: 1.36 mg/dL — ABNORMAL HIGH (ref 0.61–1.24)
GFR calc Af Amer: >60 mL/min (ref >60)
GFR calc non Af Amer: 53 mL/min — ABNORMAL LOW (ref >60)
Glucose, Bld: 102 mg/dL — ABNORMAL HIGH (ref 70–99)
Potassium: 3.8 mmol/L (ref 3.5–5.1)
Sodium: 136 mmol/L (ref 135–145)

## 2019-01-06 LAB — CBC WITH DIFFERENTIAL/PLATELET
Abs Immature Granulocytes: 0.04 10*3/uL (ref 0.00–0.07)
Basophils Absolute: 0 10*3/uL (ref 0.0–0.1)
Basophils Relative: 0 %
Eosinophils Absolute: 0 10*3/uL (ref 0.0–0.5)
Eosinophils Relative: 0 %
HCT: 39.2 % (ref 39.0–52.0)
Hemoglobin: 12 g/dL — ABNORMAL LOW (ref 13.0–17.0)
Immature Granulocytes: 1 %
Lymphocytes Relative: 12 %
Lymphs Abs: 0.9 10*3/uL (ref 0.7–4.0)
MCH: 33.7 pg (ref 26.0–34.0)
MCHC: 30.6 g/dL (ref 30.0–36.0)
MCV: 110.1 fL — ABNORMAL HIGH (ref 80.0–100.0)
Monocytes Absolute: 0.5 10*3/uL (ref 0.1–1.0)
Monocytes Relative: 6 %
Neutro Abs: 6 10*3/uL (ref 1.7–7.7)
Neutrophils Relative %: 81 %
Platelets: 146 10*3/uL — ABNORMAL LOW (ref 150–400)
RBC: 3.56 MIL/uL — ABNORMAL LOW (ref 4.22–5.81)
RDW: 17.2 % — ABNORMAL HIGH (ref 11.5–15.5)
WBC: 7.5 10*3/uL (ref 4.0–10.5)
nRBC: 0 % (ref 0.0–0.2)

## 2019-01-06 NOTE — Progress Notes (Signed)
Pt here for new patient evaluation, reports that he was referred for "a rash on his legs." Also reports not sleeping well, poor appetite, some weight loss. Has intermittent tremor and "nervousness."

## 2019-01-06 NOTE — Progress Notes (Signed)
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  INTERVAL HISTORY: Patient is a 68 year old male with a rash of unknown etiology on his left lower extremity.  Subsequent work-up included SPEP which revealed an M spike of 2.5.  He also was found to have significantly elevated kappa light chains.  Patient has multiple medical complaints, but all appear to be chronic in nature.  He has no neurologic complaints.  He denies any recent fevers or illnesses.  He has a fair appetite and admits to some weight loss.  He denies any chest pain, shortness of breath, cough, or hemoptysis.  He denies any nausea, vomiting, constipation, or diarrhea.  He has no urinary complaints, but reports intermittent testicular swelling.  Patient offers no further specific complaints today.  REVIEW OF SYSTEMS:   Review of Systems  Constitutional: Positive for malaise/fatigue and weight loss. Negative for fever.  Respiratory: Negative.  Negative for cough, hemoptysis and shortness of breath.   Cardiovascular: Negative.  Negative for chest pain and leg swelling.  Gastrointestinal: Negative.  Negative for abdominal pain.  Genitourinary: Negative.  Negative for dysuria.  Musculoskeletal: Negative.  Negative for back pain.  Skin: Positive for rash.  Neurological: Positive for weakness. Negative for dizziness, focal weakness and headaches.  Psychiatric/Behavioral: Negative.  The patient is not nervous/anxious.     As per HPI. Otherwise, a complete review of systems is negative.  PAST MEDICAL HISTORY: Past Medical History:  Diagnosis Date  . AF (atrial fibrillation) ([place])   . Cardiomyopathy ([place])   . CHF (congestive heart failure) ([place])   . Diabetes mellitus without complication ([place])     . Hypertension   . Serum lipids high     PAST SURGICAL HISTORY: Past Surgical History:  Procedure Laterality Date  . COLONOSCOPY    . COLONOSCOPY WITH PROPOFOL N/A 07/26/2015   Procedure: COLONOSCOPY WITH PROPOFOL;  Surgeon: Manya Silvas, MD;  Location: [facility];  Service: Endoscopy;  Laterality: N/A;    FAMILY HISTORY: No family history on file.  ADVANCED DIRECTIVES (Y/N):  N  HEALTH MAINTENANCE: Social History   Tobacco Use  . Smoking status: Former Research scientist (life sciences)  . Smokeless tobacco: Current User  Substance Use Topics  . Alcohol use: No  . Drug use: No     Colonoscopy:  PAP:  Bone density:  Lipid panel:  Allergies  Allergen Reactions  . Penicillins Hives    Current Outpatient Medications  Medication Sig Dispense Refill  . allopurinol (ZYLOPRIM) 300 MG tablet Take 300 mg by mouth daily.    [name] aspirin EC 81 MG tablet Take by mouth.    [name] atorvastatin (LIPITOR) 10 MG tablet Take 10 mg by mouth daily.    . clobetasol ointment (TEMOVATE) 0.05 % APPLY TOPICALLY TWO (2) TIMES A DAY. TO RASH ON R LOWER LEG    . colchicine 0.6 MG tablet Take 0.6 mg by mouth 2 (two) times daily.    . digoxin (LANOXIN) 0.125 MG tablet Take by mouth daily.    . furosemide (LASIX) 20 MG tablet Take 20 mg by mouth.    . levothyroxine (SYNTHROID, LEVOTHROID) 100 MCG tablet Take 100 mcg by mouth every morning.  4  . lisinopril (PRINIVIL,ZESTRIL) 5 MG tablet Take 5 mg by mouth daily.    . metFORMIN (GLUCOPHAGE)  500 MG tablet Take by mouth.    . metoprolol succinate (TOPROL-XL) 25 MG 24 hr tablet     . potassium chloride (K-DUR,KLOR-CON) 10 MEQ tablet Take 10 mEq by mouth daily.     [name] doxycycline (VIBRA-TABS) 100 MG tablet Take 1 tablet (100 mg total) by mouth 2 (two) times daily. 20 tablet 0  . ketoconazole (NIZORAL) 2 % cream     . terbinafine (LAMISIL) 250 MG tablet      No current facility-administered medications for this visit.    OBJECTIVE: Vitals:   01/06/19 1511  BP: 126/73   Pulse: (!) 115  Resp: 18  Temp: (!) 97 F (36.1 C)  SpO2: 92%     Body mass index is 31.74 kg/m.    ECOG FS:1 - Symptomatic but completely ambulatory  General: Well-developed, well-nourished, no acute distress. Eyes: Pink conjunctiva, anicteric sclera. HEENT: Normocephalic, moist mucous membranes. Lungs: No audible wheezing or coughing. Heart: Regular rate and rhythm. Abdomen: Soft, nontender, no obvious distention. Musculoskeletal: No edema, cyanosis, or clubbing. Neuro: Alert, answering all questions appropriately. Cranial nerves grossly intact. Skin: Erythematous rash noted on left lower extremity. Psych: Normal affect.  LAB RESULTS:  Lab Results  Component Value Date   NA 136 01/13/2013   K 4.6 01/13/2013   CL 102 01/13/2013   CO2 31 01/13/2013   GLUCOSE 129 (H) 01/13/2013   BUN 14 01/13/2013   CREATININE 1.32 (H) 01/13/2013   CALCIUM 8.8 01/13/2013   PROT 8.9 (H) 02/22/2012   ALBUMIN 4.2 02/22/2012   AST 32 02/22/2012   ALT 25 02/22/2012   ALKPHOS 91 02/22/2012   BILITOT 0.5 02/22/2012   GFRNONAA 57 (L) 01/13/2013   GFRAA >60 01/13/2013    Lab Results  Component Value Date   WBC 5.9 07/26/2015   NEUTROABS 2.9 01/13/2013   HGB 15.0 07/26/2015   HCT 43.6 07/26/2015   MCV 97.5 07/26/2015   PLT 106 (L) 07/26/2015     STUDIES: DG Foot Complete Right  Result Date: 12/09/2018 Please see detailed radiograph report in office note.   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  PLAN:    1.  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  He has mild renal insufficiency and a decreased platelet count, but no other evidence of endorgan damage.  His alkaline phosphatase levels are moderately elevated at 275.  Repeat SPEP, immunoglobulins, and kappa/lambda light chains are pending at time of dictation.  Will also get a metastatic bone survey for  completeness.  Patient will return to clinic in 2 weeks for further evaluation and discussion of his results. 2.  Thrombocytopenia: Chronic and unchanged.  Patient noted to have a decreased platelet count since at least July 2017. 3.  Hyperbilirubinemia: Patient's most recent laboratory work at an outside facility revealed a bilirubin of 1.8.  Unclear etiology.  Continue to monitor.  I spent a total of 60 minutes face-to-face with the patient and reviewing chart data of which greater than 50% of the visit was spent in counseling and coordination of care as detailed above.   Patient expressed understanding and was in agreement with this plan. He also understands that He can call clinic at any time with any questions, concerns, or complaints.   Cancer Staging No matching staging information was found for the patient.  Lloyd Huger, MD   01/06/2019 3:52 PM

## 2019-01-07 LAB — IGG, IGA, IGM
IgA: 624 mg/dL — ABNORMAL HIGH (ref 61–437)
IgG (Immunoglobin G), Serum: 4083 mg/dL — ABNORMAL HIGH (ref 603–1613)
IgM (Immunoglobulin M), Srm: 84 mg/dL (ref 20–172)

## 2019-01-09 ENCOUNTER — Other Ambulatory Visit: Payer: Self-pay

## 2019-01-09 ENCOUNTER — Ambulatory Visit
Admission: RE | Admit: 2019-01-09 | Discharge: 2019-01-09 | Disposition: A | Discharge status: Home / Self Care | Payer: PPO | Source: Ambulatory Visit | Attending: Oncology | Admitting: Oncology

## 2019-01-09 DIAGNOSIS — D472 Monoclonal gammopathy: Secondary | ICD-10-CM

## 2019-01-09 LAB — PROTEIN ELECTROPHORESIS, SERUM
A/G Ratio: 0.6 — ABNORMAL LOW (ref 0.7–1.7)
Albumin ELP: 3.3 g/dL (ref 2.9–4.4)
Alpha-1-Globulin: 0.3 g/dL (ref 0.0–0.4)
Alpha-2-Globulin: 0.6 g/dL (ref 0.4–1.0)
Beta Globulin: 1.2 g/dL (ref 0.7–1.3)
Gamma Globulin: 3.1 g/dL — ABNORMAL HIGH (ref 0.4–1.8)
Globulin, Total: 5.3 g/dL — ABNORMAL HIGH (ref 2.2–3.9)
M-Spike, %: 2.6 g/dL — ABNORMAL HIGH
Total Protein ELP: 8.6 g/dL — ABNORMAL HIGH (ref 6.0–8.5)

## 2019-01-09 LAB — KAPPA/LAMBDA LIGHT CHAINS
Kappa free light chain: 947.8 mg/L — ABNORMAL HIGH (ref 3.3–19.4)
Kappa, lambda light chain ratio: 21.84 — ABNORMAL HIGH (ref 0.26–1.65)
Lambda free light chains: 43.4 mg/L — ABNORMAL HIGH (ref 5.7–26.3)

## 2019-01-12 NOTE — Progress Notes (Signed)
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  INTERVAL HISTORY: Patient returns to clinic today for further evaluation and discussion of his laboratory and imaging results.  He has no neurologic complaints.  He denies any recent fevers or illnesses.  He has a poor appetite and complains of continued weight loss.  He denies any chest pain, shortness of breath, cough, or hemoptysis.  He denies any nausea, vomiting, constipation, or diarrhea.  He has no melena or hematochezia.  He has no urinary complaints.  Patient offers no further specific complaints today.    REVIEW OF SYSTEMS:   Review of Systems  Constitutional: Positive for malaise/fatigue and weight loss. Negative for fever.  Respiratory: Negative.  Negative for cough, hemoptysis and shortness of breath.   Cardiovascular: Negative.  Negative for chest pain and leg swelling.  Gastrointestinal: Negative.  Negative for abdominal pain.  Genitourinary: Negative.  Negative for dysuria.  Musculoskeletal: Negative.  Negative for back pain.  Skin: Negative.  Negative for rash.  Neurological: Positive for weakness. Negative for dizziness, focal weakness and headaches.  Psychiatric/Behavioral: Negative.  The patient is not nervous/anxious.     As per HPI. Otherwise, a complete review of systems is negative.  PAST MEDICAL HISTORY: Past Medical History:  Diagnosis Date  . AF (atrial fibrillation) ([place])   . Cardiomyopathy ([place])   . CHF (congestive heart failure) ([place])   . Diabetes mellitus without complication ([place])   . Hypertension   . Serum lipids high     PAST SURGICAL HISTORY: Past Surgical History:  Procedure Laterality Date  . COLONOSCOPY    . COLONOSCOPY WITH PROPOFOL N/A 07/26/2015   Procedure: COLONOSCOPY WITH PROPOFOL;  Surgeon: Manya Silvas, MD;  Location: [facility];  Service: Endoscopy;  Laterality: N/A;    FAMILY HISTORY: No family history on file.  ADVANCED DIRECTIVES (Y/N):  N  HEALTH MAINTENANCE: Social History   Tobacco Use  . Smoking status: Former Research scientist (life sciences)  . Smokeless tobacco: Current User  Substance Use Topics  . Alcohol use: No  . Drug use: No     Colonoscopy:  PAP:  Bone density:  Lipid panel:  Allergies  Allergen Reactions  . Penicillins Hives    Current Outpatient Medications  Medication Sig Dispense Refill  . allopurinol (ZYLOPRIM) 300 MG tablet Take 300 mg by mouth daily.    [name] aspirin EC 81 MG tablet Take by mouth.    [name] atorvastatin (LIPITOR) 10 MG tablet Take 10 mg by mouth daily.    . clobetasol ointment (TEMOVATE) 0.05 % APPLY TOPICALLY TWO (2) TIMES A DAY. TO RASH ON R LOWER LEG    . colchicine 0.6 MG tablet Take 0.6 mg by mouth 2 (two) times daily.    . digoxin (LANOXIN) 0.125 MG tablet Take by mouth daily.    . furosemide (LASIX) 20 MG tablet Take 20 mg by mouth.    [name] ketoconazole (NIZORAL) 2 % cream     . levothyroxine (SYNTHROID, LEVOTHROID) 100 MCG tablet Take 100 mcg by mouth every morning.  4  . lisinopril (PRINIVIL,ZESTRIL) 5 MG tablet Take 5 mg by mouth daily.    . metFORMIN (GLUCOPHAGE) 500 MG tablet Take by mouth.    . metoprolol succinate (TOPROL-XL) 25 MG 24 hr tablet     . potassium  chloride (K-DUR,KLOR-CON) 10 MEQ tablet Take 10 mEq by mouth daily.     [name] terbinafine (LAMISIL) 250 MG tablet     . doxycycline (VIBRA-TABS) 100 MG tablet Take 1 tablet (100 mg total) by mouth 2 (two) times daily. 20 tablet 0   No current facility-administered medications for this visit.    OBJECTIVE: Vitals:   01/19/19 1040  BP: 136/90  Pulse: (!) 139  Resp: 18  Temp: 97.6 F (36.4 C)  SpO2: 98%     Body mass index is 31.94 kg/m.    ECOG FS:1 - Symptomatic but completely ambulatory  General: Well-developed,  well-nourished, no acute distress. Eyes: Pink conjunctiva, anicteric sclera. HEENT: Normocephalic, moist mucous membranes. Lungs: No audible wheezing or coughing. Heart: Regular rate and rhythm. Abdomen: Soft, nontender, no obvious distention. Musculoskeletal: No edema, cyanosis, or clubbing. Neuro: Alert, answering all questions appropriately. Cranial nerves grossly intact. Skin: No rashes or petechiae noted. Psych: Normal affect.  LAB RESULTS:  Lab Results  Component Value Date   NA 136 01/06/2019   K 3.8 01/06/2019   CL 103 01/06/2019   CO2 21 (L) 01/06/2019   GLUCOSE 102 (H) 01/06/2019   BUN 21 01/06/2019   CREATININE 1.36 (H) 01/06/2019   CALCIUM 9.0 01/06/2019   PROT 8.9 (H) 02/22/2012   ALBUMIN 4.2 02/22/2012   AST 32 02/22/2012   ALT 25 02/22/2012   ALKPHOS 91 02/22/2012   BILITOT 0.5 02/22/2012   GFRNONAA 53 (L) 01/06/2019   GFRAA >60 01/06/2019    Lab Results  Component Value Date   WBC 7.5 01/06/2019   NEUTROABS 6.0 01/06/2019   HGB 12.0 (L) 01/06/2019   HCT 39.2 01/06/2019   MCV 110.1 (H) 01/06/2019   PLT 146 (L) 01/06/2019     STUDIES: DG Bone Survey Met  Result Date: 01/09/2019 CLINICAL DATA:  68 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 Query multiple myeloma. EXAM: METASTATIC BONE SURVEY COMPARISON:  Chest radiographs 01/12/2013. FINDINGS: Mildly lower lung volumes. Cardiomegaly appears increased since 2014. Other mediastinal contours are within normal limits. No acute pulmonary opacity. Bone mineralization is within normal limits for age at the skull, cervical spine, both shoulders, bilateral arms and wrists, thoracic spine (mild chronic levoconvex upper thoracic scoliosis), lumbar spine (transitional lumbosacral anatomy), pelvis, bilateral hips and legs. No overt spinal compression fracture. Bilateral rib bone mineralization appears within normal limits. Superimposed degenerative changes at the bilateral knees and ankles.  Nonspecific bilateral lower extremity soft tissue swelling. Occasional bilateral upper and lower extremity calcified peripheral vascular disease. Aortoiliac calcified atherosclerosis. Negative visible bowel gas pattern. IMPRESSION: 1. No lytic or destructive bone lesion identified to suggest multiple myeloma. 2. Cardiomegaly. Aortic Atherosclerosis (ICD10-I70.0). Mild calcified peripheral vascular disease. Electronically Signed   By: Genevie Ann M.D.   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  PLAN:    1.  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  His kappa free light chains are also elevated at 947.8 with a kappa/lambda light chain ratio of 21.84.  Metastatic bone survey on January 09, 2019 did not reveal any lytic or destructive lesions suggestive of multiple myeloma. He has mild renal insufficiency and a decreased platelet count, but no other evidence of endorgan damage.  His alkaline phosphatase levels are moderately elevated at 275.  I recommended bone marrow biopsy to further determine whether patient has MGUS, smoldering  myeloma or overt myeloma.  Patient states he wishes to delay this procedure until he has his colonoscopy in the next several weeks.  Have planned follow-up in 3 months with laboratory work and further evaluation, patient will call clinic if his colonoscopy happen sooner and wishes to schedule bone marrow biopsy. 2.  Thrombocytopenia: Chronic and unchanged.  Patient noted to have a decreased platelet count since at least July 2017. 3.  Hyperbilirubinemia: Patient's most recent laboratory work at an outside facility revealed a bilirubin of 1.8.  Unclear etiology.  Continue to monitor. 4.  Anemia: Mild.  Patient's hemoglobin is 12.0. 5.  Renal insufficiency: Patient's most recent creatinine of 1.36 appears to be is approximately his  baseline.  Patient expressed understanding and was in agreement with this plan. He also understands that He can call clinic at any time with any questions, concerns, or complaints.   Cancer Staging No matching staging information was found for the patient.  Lloyd Huger, MD   01/20/2019 9:13 AM

## 2019-01-18 NOTE — Progress Notes (Signed)
Patient prescreened for appointment. Patient has no concerns or questions.  

## 2019-01-19 ENCOUNTER — Other Ambulatory Visit: Payer: Self-pay

## 2019-01-19 ENCOUNTER — Inpatient Hospital Stay (HOSPITAL_BASED_OUTPATIENT_CLINIC_OR_DEPARTMENT_OTHER): Payer: PPO | Admitting: Oncology

## 2019-01-19 VITALS — BP 136/90 | HR 139 | Temp 97.6°F | Resp 18 | Wt 248.8 lb

## 2019-01-19 DIAGNOSIS — D472 Monoclonal gammopathy: Secondary | ICD-10-CM | POA: Diagnosis not present

## 2019-01-19 DIAGNOSIS — R251 Tremor, unspecified: Secondary | ICD-10-CM | POA: Diagnosis not present

## 2019-01-23 DIAGNOSIS — E119 Type 2 diabetes mellitus without complications: Secondary | ICD-10-CM | POA: Diagnosis not present

## 2019-01-23 DIAGNOSIS — E785 Hyperlipidemia, unspecified: Secondary | ICD-10-CM | POA: Diagnosis not present

## 2019-01-23 DIAGNOSIS — E039 Hypothyroidism, unspecified: Secondary | ICD-10-CM | POA: Diagnosis not present

## 2019-01-26 DIAGNOSIS — M310 Hypersensitivity angiitis: Secondary | ICD-10-CM | POA: Diagnosis not present

## 2019-01-26 DIAGNOSIS — I776 Arteritis, unspecified: Secondary | ICD-10-CM | POA: Diagnosis not present

## 2019-01-31 ENCOUNTER — Telehealth: Payer: PPO | Admitting: Oncology

## 2019-01-31 DIAGNOSIS — E119 Type 2 diabetes mellitus without complications: Secondary | ICD-10-CM | POA: Diagnosis not present

## 2019-01-31 DIAGNOSIS — I4891 Unspecified atrial fibrillation: Secondary | ICD-10-CM | POA: Diagnosis not present

## 2019-01-31 DIAGNOSIS — E038 Other specified hypothyroidism: Secondary | ICD-10-CM | POA: Diagnosis not present

## 2019-01-31 DIAGNOSIS — E785 Hyperlipidemia, unspecified: Secondary | ICD-10-CM | POA: Diagnosis not present

## 2019-02-02 DIAGNOSIS — R1013 Epigastric pain: Secondary | ICD-10-CM | POA: Diagnosis not present

## 2019-02-02 DIAGNOSIS — R1310 Dysphagia, unspecified: Secondary | ICD-10-CM | POA: Diagnosis not present

## 2019-02-02 DIAGNOSIS — Z86010 Personal history of colonic polyps: Secondary | ICD-10-CM | POA: Diagnosis not present

## 2019-02-02 DIAGNOSIS — I428 Other cardiomyopathies: Secondary | ICD-10-CM | POA: Diagnosis not present

## 2019-02-02 DIAGNOSIS — I5022 Chronic systolic (congestive) heart failure: Secondary | ICD-10-CM | POA: Diagnosis not present

## 2019-02-02 DIAGNOSIS — D472 Monoclonal gammopathy: Secondary | ICD-10-CM | POA: Diagnosis not present

## 2019-02-02 DIAGNOSIS — I480 Paroxysmal atrial fibrillation: Secondary | ICD-10-CM | POA: Diagnosis not present

## 2019-02-02 DIAGNOSIS — Z862 Personal history of diseases of the blood and blood-forming organs and certain disorders involving the immune mechanism: Secondary | ICD-10-CM | POA: Diagnosis not present

## 2019-02-07 DIAGNOSIS — I5022 Chronic systolic (congestive) heart failure: Secondary | ICD-10-CM | POA: Diagnosis not present

## 2019-02-07 DIAGNOSIS — R600 Localized edema: Secondary | ICD-10-CM | POA: Insufficient documentation

## 2019-02-07 DIAGNOSIS — I428 Other cardiomyopathies: Secondary | ICD-10-CM | POA: Diagnosis not present

## 2019-02-07 DIAGNOSIS — I480 Paroxysmal atrial fibrillation: Secondary | ICD-10-CM | POA: Diagnosis not present

## 2019-02-07 DIAGNOSIS — I361 Nonrheumatic tricuspid (valve) insufficiency: Secondary | ICD-10-CM | POA: Diagnosis not present

## 2019-02-07 DIAGNOSIS — I519 Heart disease, unspecified: Secondary | ICD-10-CM | POA: Diagnosis not present

## 2019-02-07 DIAGNOSIS — I340 Nonrheumatic mitral (valve) insufficiency: Secondary | ICD-10-CM | POA: Diagnosis not present

## 2019-02-10 DIAGNOSIS — L959 Vasculitis limited to the skin, unspecified: Secondary | ICD-10-CM | POA: Diagnosis not present

## 2019-02-10 DIAGNOSIS — R600 Localized edema: Secondary | ICD-10-CM | POA: Diagnosis not present

## 2019-02-10 DIAGNOSIS — I351 Nonrheumatic aortic (valve) insufficiency: Secondary | ICD-10-CM | POA: Diagnosis not present

## 2019-02-10 DIAGNOSIS — I5021 Acute systolic (congestive) heart failure: Secondary | ICD-10-CM | POA: Diagnosis not present

## 2019-02-10 DIAGNOSIS — I509 Heart failure, unspecified: Secondary | ICD-10-CM | POA: Diagnosis not present

## 2019-02-10 DIAGNOSIS — Z7984 Long term (current) use of oral hypoglycemic drugs: Secondary | ICD-10-CM | POA: Diagnosis not present

## 2019-02-10 DIAGNOSIS — E785 Hyperlipidemia, unspecified: Secondary | ICD-10-CM | POA: Diagnosis not present

## 2019-02-10 DIAGNOSIS — C9000 Multiple myeloma not having achieved remission: Secondary | ICD-10-CM | POA: Diagnosis not present

## 2019-02-10 DIAGNOSIS — Z20822 Contact with and (suspected) exposure to covid-19: Secondary | ICD-10-CM | POA: Diagnosis not present

## 2019-02-10 DIAGNOSIS — K219 Gastro-esophageal reflux disease without esophagitis: Secondary | ICD-10-CM | POA: Diagnosis not present

## 2019-02-10 DIAGNOSIS — Z9889 Other specified postprocedural states: Secondary | ICD-10-CM | POA: Diagnosis not present

## 2019-02-10 DIAGNOSIS — N179 Acute kidney failure, unspecified: Secondary | ICD-10-CM | POA: Diagnosis not present

## 2019-02-10 DIAGNOSIS — D472 Monoclonal gammopathy: Secondary | ICD-10-CM | POA: Diagnosis not present

## 2019-02-10 DIAGNOSIS — Z7982 Long term (current) use of aspirin: Secondary | ICD-10-CM | POA: Diagnosis not present

## 2019-02-10 DIAGNOSIS — I50813 Acute on chronic right heart failure: Secondary | ICD-10-CM | POA: Diagnosis not present

## 2019-02-10 DIAGNOSIS — K7460 Unspecified cirrhosis of liver: Secondary | ICD-10-CM | POA: Diagnosis not present

## 2019-02-10 DIAGNOSIS — I10 Essential (primary) hypertension: Secondary | ICD-10-CM | POA: Diagnosis not present

## 2019-02-10 DIAGNOSIS — I110 Hypertensive heart disease with heart failure: Secondary | ICD-10-CM | POA: Diagnosis not present

## 2019-02-10 DIAGNOSIS — I776 Arteritis, unspecified: Secondary | ICD-10-CM | POA: Diagnosis not present

## 2019-02-10 DIAGNOSIS — I5020 Unspecified systolic (congestive) heart failure: Secondary | ICD-10-CM | POA: Diagnosis not present

## 2019-02-10 DIAGNOSIS — E8770 Fluid overload, unspecified: Secondary | ICD-10-CM | POA: Diagnosis not present

## 2019-02-10 DIAGNOSIS — N182 Chronic kidney disease, stage 2 (mild): Secondary | ICD-10-CM | POA: Diagnosis not present

## 2019-02-10 DIAGNOSIS — R4182 Altered mental status, unspecified: Secondary | ICD-10-CM | POA: Diagnosis not present

## 2019-02-10 DIAGNOSIS — R188 Other ascites: Secondary | ICD-10-CM | POA: Diagnosis not present

## 2019-02-10 DIAGNOSIS — M7989 Other specified soft tissue disorders: Secondary | ICD-10-CM | POA: Diagnosis not present

## 2019-02-10 DIAGNOSIS — I130 Hypertensive heart and chronic kidney disease with heart failure and stage 1 through stage 4 chronic kidney disease, or unspecified chronic kidney disease: Secondary | ICD-10-CM | POA: Diagnosis not present

## 2019-02-10 DIAGNOSIS — Z87891 Personal history of nicotine dependence: Secondary | ICD-10-CM | POA: Diagnosis not present

## 2019-02-10 DIAGNOSIS — I426 Alcoholic cardiomyopathy: Secondary | ICD-10-CM | POA: Diagnosis not present

## 2019-02-10 DIAGNOSIS — R946 Abnormal results of thyroid function studies: Secondary | ICD-10-CM | POA: Diagnosis not present

## 2019-02-10 DIAGNOSIS — R160 Hepatomegaly, not elsewhere classified: Secondary | ICD-10-CM | POA: Diagnosis not present

## 2019-02-10 DIAGNOSIS — I5082 Biventricular heart failure: Secondary | ICD-10-CM | POA: Diagnosis not present

## 2019-02-10 DIAGNOSIS — K760 Fatty (change of) liver, not elsewhere classified: Secondary | ICD-10-CM | POA: Diagnosis not present

## 2019-02-10 DIAGNOSIS — N189 Chronic kidney disease, unspecified: Secondary | ICD-10-CM | POA: Diagnosis not present

## 2019-02-10 DIAGNOSIS — I428 Other cardiomyopathies: Secondary | ICD-10-CM | POA: Diagnosis not present

## 2019-02-10 DIAGNOSIS — I272 Pulmonary hypertension, unspecified: Secondary | ICD-10-CM | POA: Diagnosis not present

## 2019-02-10 DIAGNOSIS — N281 Cyst of kidney, acquired: Secondary | ICD-10-CM | POA: Diagnosis not present

## 2019-02-10 DIAGNOSIS — I129 Hypertensive chronic kidney disease with stage 1 through stage 4 chronic kidney disease, or unspecified chronic kidney disease: Secondary | ICD-10-CM | POA: Diagnosis not present

## 2019-02-10 DIAGNOSIS — R9439 Abnormal result of other cardiovascular function study: Secondary | ICD-10-CM | POA: Diagnosis not present

## 2019-02-10 DIAGNOSIS — R0989 Other specified symptoms and signs involving the circulatory and respiratory systems: Secondary | ICD-10-CM | POA: Diagnosis not present

## 2019-02-10 DIAGNOSIS — I4891 Unspecified atrial fibrillation: Secondary | ICD-10-CM | POA: Diagnosis not present

## 2019-02-10 DIAGNOSIS — R7303 Prediabetes: Secondary | ICD-10-CM | POA: Diagnosis not present

## 2019-02-10 DIAGNOSIS — K7689 Other specified diseases of liver: Secondary | ICD-10-CM | POA: Diagnosis not present

## 2019-02-10 DIAGNOSIS — I482 Chronic atrial fibrillation, unspecified: Secondary | ICD-10-CM | POA: Diagnosis not present

## 2019-02-10 DIAGNOSIS — L958 Other vasculitis limited to the skin: Secondary | ICD-10-CM | POA: Diagnosis not present

## 2019-02-10 DIAGNOSIS — D696 Thrombocytopenia, unspecified: Secondary | ICD-10-CM | POA: Diagnosis not present

## 2019-02-10 DIAGNOSIS — I5023 Acute on chronic systolic (congestive) heart failure: Secondary | ICD-10-CM | POA: Diagnosis not present

## 2019-02-10 DIAGNOSIS — N186 End stage renal disease: Secondary | ICD-10-CM | POA: Diagnosis not present

## 2019-02-10 DIAGNOSIS — E039 Hypothyroidism, unspecified: Secondary | ICD-10-CM | POA: Diagnosis not present

## 2019-02-10 DIAGNOSIS — R7989 Other specified abnormal findings of blood chemistry: Secondary | ICD-10-CM | POA: Diagnosis not present

## 2019-02-10 DIAGNOSIS — S81802A Unspecified open wound, left lower leg, initial encounter: Secondary | ICD-10-CM | POA: Diagnosis not present

## 2019-02-10 DIAGNOSIS — L539 Erythematous condition, unspecified: Secondary | ICD-10-CM | POA: Diagnosis not present

## 2019-02-10 DIAGNOSIS — R251 Tremor, unspecified: Secondary | ICD-10-CM | POA: Diagnosis not present

## 2019-02-10 DIAGNOSIS — I2510 Atherosclerotic heart disease of native coronary artery without angina pectoris: Secondary | ICD-10-CM | POA: Diagnosis not present

## 2019-02-11 DIAGNOSIS — R1310 Dysphagia, unspecified: Secondary | ICD-10-CM | POA: Insufficient documentation

## 2019-02-11 DIAGNOSIS — D472 Monoclonal gammopathy: Secondary | ICD-10-CM | POA: Insufficient documentation

## 2019-02-11 DIAGNOSIS — R600 Localized edema: Secondary | ICD-10-CM | POA: Diagnosis not present

## 2019-02-11 DIAGNOSIS — M310 Hypersensitivity angiitis: Secondary | ICD-10-CM | POA: Insufficient documentation

## 2019-02-11 DIAGNOSIS — D126 Benign neoplasm of colon, unspecified: Secondary | ICD-10-CM | POA: Insufficient documentation

## 2019-02-11 DIAGNOSIS — R1319 Other dysphagia: Secondary | ICD-10-CM | POA: Insufficient documentation

## 2019-02-11 DIAGNOSIS — I509 Heart failure, unspecified: Secondary | ICD-10-CM | POA: Diagnosis not present

## 2019-02-11 DIAGNOSIS — R7989 Other specified abnormal findings of blood chemistry: Secondary | ICD-10-CM | POA: Insufficient documentation

## 2019-02-11 DIAGNOSIS — R4182 Altered mental status, unspecified: Secondary | ICD-10-CM | POA: Insufficient documentation

## 2019-02-11 DIAGNOSIS — C9000 Multiple myeloma not having achieved remission: Secondary | ICD-10-CM | POA: Insufficient documentation

## 2019-02-11 DIAGNOSIS — R251 Tremor, unspecified: Secondary | ICD-10-CM | POA: Insufficient documentation

## 2019-02-11 DIAGNOSIS — E8770 Fluid overload, unspecified: Secondary | ICD-10-CM | POA: Insufficient documentation

## 2019-02-13 ENCOUNTER — Telehealth: Payer: Self-pay | Admitting: *Deleted

## 2019-02-13 DIAGNOSIS — K7010 Alcoholic hepatitis without ascites: Secondary | ICD-10-CM | POA: Insufficient documentation

## 2019-02-13 MED ORDER — ASPIRIN 81 MG PO CHEW
81.00 | CHEWABLE_TABLET | ORAL | Status: DC
Start: 2019-02-14 — End: 2019-02-13

## 2019-02-13 MED ORDER — HEPARIN SODIUM (PORCINE) 5000 UNIT/ML IJ SOLN
5000.00 | INTRAMUSCULAR | Status: DC
Start: 2019-02-13 — End: 2019-02-13

## 2019-02-13 MED ORDER — LEVOTHYROXINE SODIUM 100 MCG PO TABS
100.00 | ORAL_TABLET | ORAL | Status: DC
Start: 2019-02-19 — End: 2019-02-13

## 2019-02-13 MED ORDER — MAGNESIUM OXIDE 400 MG PO TABS
800.00 | ORAL_TABLET | ORAL | Status: DC
Start: 2019-02-19 — End: 2019-02-13

## 2019-02-13 MED ORDER — FUROSEMIDE 10 MG/ML IJ SOLN
40.00 | INTRAMUSCULAR | Status: DC
Start: 2019-02-14 — End: 2019-02-13

## 2019-02-13 MED ORDER — ATORVASTATIN CALCIUM 10 MG PO TABS
10.00 | ORAL_TABLET | ORAL | Status: DC
Start: 2019-02-19 — End: 2019-02-13

## 2019-02-13 MED ORDER — COLCHICINE 0.6 MG PO TABS
0.30 | ORAL_TABLET | ORAL | Status: DC
Start: 2019-02-19 — End: 2019-02-13

## 2019-02-13 MED ORDER — METOPROLOL SUCCINATE ER 25 MG PO TB24
12.50 | ORAL_TABLET | ORAL | Status: DC
Start: 2019-02-14 — End: 2019-02-13

## 2019-02-13 MED ORDER — ALLOPURINOL 100 MG PO TABS
50.00 | ORAL_TABLET | ORAL | Status: DC
Start: 2019-02-19 — End: 2019-02-13

## 2019-02-13 MED ORDER — PANTOPRAZOLE SODIUM 20 MG PO TBEC
20.00 | DELAYED_RELEASE_TABLET | ORAL | Status: DC
Start: 2019-02-19 — End: 2019-02-13

## 2019-02-13 MED ORDER — DIGOXIN 125 MCG PO TABS
125.00 | ORAL_TABLET | ORAL | Status: DC
Start: 2019-02-14 — End: 2019-02-13

## 2019-02-13 NOTE — Telephone Encounter (Signed)
Attempted to call son with physician resposne, voice mail is full and I could not leave a message

## 2019-02-13 NOTE — Telephone Encounter (Signed)
I cannot order a BMBx at [facility].  If the [facility] docs would like to order it while he's there, I would have no problem with that.

## 2019-02-13 NOTE — Telephone Encounter (Signed)
Patient son called reporting that patient is in the hospital at [facility] currently and that he is scheduled for a BMBX in April at [facility]. He is requesting that an order be called to [facility] to get it done while he is inpatient there and that they are in agreement in [facility] to do this. Please advise/ call order to [facility] if in agreement

## 2019-02-14 NOTE — Telephone Encounter (Signed)
Left message on [facility] voice mail with response from [name]

## 2019-02-17 ENCOUNTER — Telehealth: Payer: Self-pay | Admitting: *Deleted

## 2019-02-17 MED ORDER — SACUBITRIL-VALSARTAN 24-26 MG PO TABS
ORAL_TABLET | ORAL | Status: DC
Start: 2019-02-18 — End: 2019-02-17

## 2019-02-17 MED ORDER — MELATONIN 3 MG PO TABS
3.00 | ORAL_TABLET | ORAL | Status: DC
Start: ? — End: 2019-02-17

## 2019-02-17 MED ORDER — APIXABAN 5 MG PO TABS
5.00 | ORAL_TABLET | ORAL | Status: DC
Start: 2019-02-18 — End: 2019-02-17

## 2019-02-17 MED ORDER — ONDANSETRON HCL 4 MG/2ML IJ SOLN
4.00 | INTRAMUSCULAR | Status: DC
Start: ? — End: 2019-02-17

## 2019-02-17 MED ORDER — ACETAMINOPHEN 325 MG PO TABS
650.00 | ORAL_TABLET | ORAL | Status: DC
Start: ? — End: 2019-02-17

## 2019-02-17 MED ORDER — METOPROLOL SUCCINATE ER 25 MG PO TB24
25.00 | ORAL_TABLET | ORAL | Status: DC
Start: 2019-02-19 — End: 2019-02-17

## 2019-02-17 NOTE — Telephone Encounter (Signed)
[name] Fellow called to report that patient had BM biopsy done at [facility] and the results are available in Care Everywhere. 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 Patient remains in the hospital and he was unable to get a discharge date commitment from Cardiology as to when he will be discharged. His follow up with Dr [name] is not until April  Hematopathology OrderResulted: 02/16/2019 11:23 AM [place] Component Name Value Ref Range  Final Diagnosis Bone marrow, right iliac, aspiration and biopsy -  Slightly hypercellular bone marrow (60%) involved by plasma cell neoplasm (26% plasma cells by manual aspirate differential count, 20% and 5-10% by CD138 immunohistochemical analysis of clot and core, respectively, and monotypic Kappa by in-situ hybridization) -  Routine cytogenetic and potential plasma cell proliferative disorders FISH panel studies are pending.  This electronic signature is attestation that the pathologist personally reviewed the submitted material(s) and the final diagnosis reflects that evaluation.   Clinical History The patient is a 69 year old male who presents in consultation for evaluation of monoclonal gammopathy.    Gross Description  Received are left iliac aspirate and left biopsy measuring 0.2 cm x 1.0 cm. Cytogenetic and potential plasma cell proliferative disorders FISH panel studies are requested. Flow cytometric analysis is performed. Myeloma prognostic risk signature study is requested through referral testing.   A. BMAL.Aspirate is submitted in one block(s).   B. BMBL. Biopsy is submitted in one block(s).   C. Peripheral Blood   Microscopic Description Microscopic examination substantiates the above diagnosis.  Peripheral Blood: Platelets:  Decreased Erythroid:  Macrocytic anemia; moderate anisocytosis, artifactual changes Leukocytes:  Leukopenia with lymphopenia  Bone Marrow  Aspirate: Cellularity:  Cellular marrow particles Megakaryocytes:  Adequate in number with occasional small, hypolobated forms Erythropoiesis:  Generally orderly maturation with occasional dysplastic forms Granulopoiesis:  Orderly maturation Other:   plasmacytosis  M:E ratio: 1.7:1 Differential: 500 cells counted/hl  1% blasts 3% promyelocytes 4% myelocytes 30% maturing granulocytes 24% erythroid 8% lymphocytes 2% monocytes 2% eosinophils <1% basophils 26% plasma cells (ranging from 13% to 42%)  Bone Marrow Touch Prep:  Confirmatory  Bone Marrow Clot and/or Biopsy:   The bone marrow clot and biopsy sections are confirmatory of the aspirate impression and differential with trilineage hematopoiesis and increased scattered plasma cells.  Cellularity:  Clot: 50% Biopsy: 60%  Special Stains: CD56, kappa, and lambda are performed by immunohistochemistry in addition to flow cytometry for histopathologic and immunophenotypic correlation.  Clot and biopsy sections are stained. CD138: CD138 stains approximately 20% of the marrow cells in the clot section and 5-10% in the biopsy section.  Biopsy sections are stained. Kappa/Lambda (ISH): Kappa/Lambda (ISH) show monotypic Kappa staining. CD56: CD56 stains the neoplastic plasma cells.   Resident Physician: None Assigned   EMBEDDED IMAGES    Disclaimer Unless otherwise specified, specimens are preserved using 10% neutral buffered formalin. For cases in which immunohistochemical and/or in-situ hybridization stains are performed, the following statement applies: Appropriate controls for each stain (positive controls with or without negative controls) have been evaluated and stain as expected. These stains have not been separately validated for use on decalcified specimens and should be interpreted with caution in that setting. Some of the reagents used for these stains may be classified as analyte specific reagents (ASR). Tests using ASRs  were developed, and their performance characteristics were determined, by the Anatomic Pathology Department ([facility]). They have not been cleared or approved by  the [place] Food and Drug Administration (FDA). The FDA does not require these tests to go through premarket FDA review. These tests are used for clinical purposes. They should not be regarded as investigational or for research. This laboratory is certified under the Clinical Laboratory Improvement Amendments (CLIA) as qualified to perform high complexity clinical laboratory testing.   Flow Cytometry Summary Flow Cytometric Immunophenotyping Results:  Bone marrow aspirate Heme WBC  5,800 cells/uL   Flow Differential (% of Total) Viability:   N/A Total of Markers Charged 15 Markers-1 Charged  14  Gated Population:  5% Lymphocytes  Description of Gated Cells (% of Gated)   B-Cell Markers:  CD19   6% CD20   9% Kappa   4% Lambda  2%  T-Cell Markers:  CD2   85% CD3   69% CD3/CD4  40% Total CD4  40% CD5   71% CD7   79% CD3/CD8  25% Total CD8  40%  Miscellaneous:  CD10    <1% CD38    55% CD45    100% Bright CD57    31% CD3-/CD16+56  21% Total CD16+56  31%    Flow Cytometry Interpretation: Flow cytometric analysis of the bone marrow aspirate reveals a cellular, but somewhat hemodilute, specimen (5800 cells/uL).  A population of 5% cells is gated on the lymphocyte region and is composed of 69% T-cells (CD4: CD8 ratio is 1.6:1) without an aberrant immunophenotype, 6% polytypic B-cells, and 21% NK-cells.  Flow cytometric analysis fails to reveal a monotypic B-cell or overtly aberrant T-cell population.  There is a small population of CD45 negative, CD38 bright plasma cell population that is CD19 negative and dim CD20 positive and appears to aberrantly express CD56.   This test, utilizing analyte-specific reagents (ASR) was developed and its performance characteristics determined by the  [facility]. It has not been cleared or approved by the U.S. Food and Drug Administration (FDA). The FDA has determined that such clearance or approval is not necessary. This test is used for clinical purposes. It should not be regarded as investigational or for research.   Specimen Collected on  AP Specimen - Structure of peripheral vein (body structure), Bone Marrow Left - Biopsy, Structure of peripheral vein (body structure) 02/14/2019 9:30 AM

## 2019-02-17 NOTE — Telephone Encounter (Signed)
[place], thanks.  Lets move his appointment up to sometime in February, 1-2 weeks after he is d/c'ed from [facility].

## 2019-02-17 NOTE — Telephone Encounter (Signed)
Will some notify me once patient is Discharged? I have no way of knowing when he is released.

## 2019-02-17 NOTE — Telephone Encounter (Signed)
Schedule message sent. 

## 2019-02-20 MED ORDER — FUROSEMIDE 40 MG PO TABS
40.00 | ORAL_TABLET | ORAL | Status: DC
Start: 2019-02-19 — End: 2019-02-20

## 2019-02-21 ENCOUNTER — Telehealth: Payer: Self-pay | Admitting: *Deleted

## 2019-02-21 NOTE — Telephone Encounter (Signed)
If we can squeeze him in this week that's fine, if not [facility] can see him.

## 2019-02-21 NOTE — Telephone Encounter (Signed)
Message from [facility] that patient was recently released from [facility] and has a knot on his leg, could be fluid. they would like for him to be seen. wanted you to check on this first. Waunita Schooner, friend. call [phone]

## 2019-02-22 ENCOUNTER — Inpatient Hospital Stay: Payer: PPO | Attending: Oncology | Admitting: Oncology

## 2019-02-22 ENCOUNTER — Other Ambulatory Visit: Payer: Self-pay

## 2019-02-22 VITALS — BP 96/52 | HR 56 | Temp 97.4°F | Resp 18

## 2019-02-22 DIAGNOSIS — Z7901 Long term (current) use of anticoagulants: Secondary | ICD-10-CM | POA: Insufficient documentation

## 2019-02-22 DIAGNOSIS — I509 Heart failure, unspecified: Secondary | ICD-10-CM | POA: Diagnosis not present

## 2019-02-22 DIAGNOSIS — C9000 Multiple myeloma not having achieved remission: Secondary | ICD-10-CM | POA: Insufficient documentation

## 2019-02-22 DIAGNOSIS — D649 Anemia, unspecified: Secondary | ICD-10-CM | POA: Diagnosis not present

## 2019-02-22 DIAGNOSIS — I110 Hypertensive heart disease with heart failure: Secondary | ICD-10-CM | POA: Insufficient documentation

## 2019-02-22 DIAGNOSIS — I4891 Unspecified atrial fibrillation: Secondary | ICD-10-CM | POA: Insufficient documentation

## 2019-02-22 DIAGNOSIS — Z79899 Other long term (current) drug therapy: Secondary | ICD-10-CM | POA: Insufficient documentation

## 2019-02-22 DIAGNOSIS — L03115 Cellulitis of right lower limb: Secondary | ICD-10-CM | POA: Insufficient documentation

## 2019-02-22 DIAGNOSIS — E119 Type 2 diabetes mellitus without complications: Secondary | ICD-10-CM | POA: Insufficient documentation

## 2019-02-22 DIAGNOSIS — N289 Disorder of kidney and ureter, unspecified: Secondary | ICD-10-CM | POA: Insufficient documentation

## 2019-02-22 DIAGNOSIS — Z87891 Personal history of nicotine dependence: Secondary | ICD-10-CM | POA: Insufficient documentation

## 2019-02-22 DIAGNOSIS — D472 Monoclonal gammopathy: Secondary | ICD-10-CM

## 2019-02-22 MED ORDER — SULFAMETHOXAZOLE-TRIMETHOPRIM 800-160 MG PO TABS: 1.0000 | ORAL_TABLET | Freq: Two times a day (BID) | ORAL | 0 refills | Status: DC

## 2019-02-22 NOTE — Progress Notes (Signed)
Symptom Management Consult note [facility]  [phone] [phone]  Patient Care Team: Albina Billet, MD as PCP - General (Internal Medicine)   Name of the patient: [name]  [id number]  1951/01/18   Date of visit: 02/22/2019   Diagnosis- MGUS  Chief complaint/ Reason for visit- Right leg pain  Heme/Onc history:  Oncology History   No history exists.   Interval history-patient presents to symptom management for complaints of right leg pain.  He was recently discharged from [facility] for fluid overload (02/10/19-02/18/19), BNP 19,000 and chest x-ray positive for Pulmonary edema. Hospital stay included IV Lasix with > 30 lbs of fluid removed, heart cath revealing non obstructive CAD and moderate pulmonary HTN and a TEE to convert from afib to NSR.   He was started on [place] and eliquis. Bilateral venous duplex negative for dvt.  He was worked up for leukoctclastic vasculitis and found to have an M spike of 2.5.  Metastatic bone survey negative for lesions.  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  Patient was recently referred to Dr. Grayland Ormond for a rash of unknown etiology of his left lower extremity.  Subsequent work-up included SPEP which revealed an M spike of 2.5.  He was also found to have significantly elevated kappa light chains.  He had mild renal insufficiency and decreased platelet count but no other evidence of endorgan damage.  Bone survey ordered to complete work-up.  Today, patient states he developed a painful "spot" on his right leg approximately 4 days ago.  He noticed it after he had been discharged from [facility] on Saturday evening.  His son states the area has enlarged daily although the patient states he thought it appeared smaller today.  He denies any injury to the area.  He denies any fevers.  Notes pain only when applying pressure.  Patient states,  girlfriend noticed it appeared redder and hot to touch and wanted him evaluated. No drainage. No previous history or cellulitis. Has no applied anything to the site.    ECOG FS:2 - Symptomatic, <50% confined to bed  Review of systems- Review of Systems  Constitutional: Positive for malaise/fatigue. Negative for chills, fever and weight loss.  HENT: Negative for congestion, ear pain and tinnitus.   Eyes: Negative.  Negative for blurred vision and double vision.  Respiratory: Negative.  Negative for cough, sputum production and shortness of breath.   Cardiovascular: Positive for leg swelling. Negative for chest pain and palpitations.  Gastrointestinal: Negative.  Negative for abdominal pain, constipation, diarrhea, nausea and vomiting.  Genitourinary: Negative for dysuria, frequency and urgency.  Musculoskeletal: Negative for back pain and falls.  Skin: Positive for rash.  Neurological: Negative.  Negative for weakness and headaches.  Endo/Heme/Allergies: Negative.  Does not bruise/bleed easily.  Psychiatric/Behavioral: Negative.  Negative for depression. The patient is not nervous/anxious and does not have insomnia.      Current treatment- Work-up at this time for MGUS-Smoldering Myeloma  Allergies  Allergen Reactions  . Penicillins Hives     Past Medical History:  Diagnosis Date  . AF (atrial fibrillation) ([place])   . Cardiomyopathy ([place])   . CHF (congestive heart failure) ([place])   . Diabetes mellitus without complication ([place])   . Hypertension   . Serum lipids high      Past Surgical History:  Procedure Laterality Date  . COLONOSCOPY    . COLONOSCOPY WITH PROPOFOL  N/A 07/26/2015   Procedure: COLONOSCOPY WITH PROPOFOL;  Surgeon: Manya Silvas, MD;  Location: [facility] ENDOSCOPY;  Service: Endoscopy;  Laterality: N/A;    Social History   Socioeconomic History  . Marital status: Widowed    Spouse name: Not on file  . Number of children: Not on file  . Years of education: Not  on file  . Highest education level: Not on file  Occupational History  . Not on file  Tobacco Use  . Smoking status: Former Research scientist (life sciences)  . Smokeless tobacco: Current User  Substance and Sexual Activity  . Alcohol use: No  . Drug use: No  . Sexual activity: Not on file  Other Topics Concern  . Not on file  Social History Narrative  . Not on file   Social Determinants of Health   Financial Resource Strain:   . Difficulty of Paying Living Expenses: Not on file  Food Insecurity:   . Worried About Charity fundraiser in the Last Year: Not on file  . Ran Out of Food in the Last Year: Not on file  Transportation Needs:   . Lack of Transportation (Medical): Not on file  . Lack of Transportation (Non-Medical): Not on file  Physical Activity:   . Days of Exercise per Week: Not on file  . Minutes of Exercise per Session: Not on file  Stress:   . Feeling of Stress : Not on file  Social Connections:   . Frequency of Communication with Friends and Family: Not on file  . Frequency of Social Gatherings with Friends and Family: Not on file  . Attends Religious Services: Not on file  . Active Member of Clubs or Organizations: Not on file  . Attends Archivist Meetings: Not on file  . Marital Status: Not on file  Intimate Partner Violence:   . Fear of Current or Ex-Partner: Not on file  . Emotionally Abused: Not on file  . Physically Abused: Not on file  . Sexually Abused: Not on file    No family history on file.   Current Outpatient Medications:  .  apixaban (ELIQUIS) 5 MG TABS tablet, Take by mouth 2 (two) times daily. , Disp: , Rfl:  .  colchicine 0.6 MG tablet, Take 0.3 mg by mouth daily. , Disp: , Rfl:  .  furosemide (LASIX) 40 MG tablet, Take by mouth daily. , Disp: , Rfl:  .  magnesium oxide (MAG-OX) 400 MG tablet, Take by mouth 2 (two) times daily. , Disp: , Rfl:  .  pantoprazole (PROTONIX) 20 MG tablet, Take 20 mg by mouth daily. , Disp: , Rfl:  .   sacubitril-valsartan (ENTRESTO) 24-26 MG, Take 1 tablet by mouth 2 (two) times daily. , Disp: , Rfl:  .  allopurinol (ZYLOPRIM) 300 MG tablet, Take 300 mg by mouth daily., Disp: , Rfl:  .  atorvastatin (LIPITOR) 10 MG tablet, Take 10 mg by mouth daily., Disp: , Rfl:  .  clobetasol cream (TEMOVATE) 1.02 %, Apply 1 application topically 2 (two) times daily., Disp: , Rfl:  .  levothyroxine (SYNTHROID, LEVOTHROID) 100 MCG tablet, Take 100 mcg by mouth every morning., Disp: , Rfl: 4 .  metoprolol succinate (TOPROL-XL) 25 MG 24 hr tablet, 25 mg daily. , Disp: , Rfl:  .  potassium chloride (K-DUR,KLOR-CON) 10 MEQ tablet, Take 10 mEq by mouth daily. , Disp: , Rfl:  .  sulfamethoxazole-trimethoprim (BACTRIM DS) 800-160 MG tablet, Take 1 tablet by mouth 2 (two) times daily., Disp:  14 tablet, Rfl: 0  Physical exam:  Vitals:   02/22/19 1413  BP: (!) 96/52  Pulse: (!) 56  Resp: 18  Temp: (!) 97.4 F (36.3 C)  TempSrc: Tympanic   Physical Exam Constitutional:      Appearance: Normal appearance.  HENT:     Head: Normocephalic and atraumatic.  Eyes:     Pupils: Pupils are equal, round, and reactive to light.  Cardiovascular:     Rate and Rhythm: Normal rate and regular rhythm.     Heart sounds: Normal heart sounds. No murmur.  Pulmonary:     Effort: Pulmonary effort is normal.     Breath sounds: Normal breath sounds. No wheezing.  Abdominal:     General: Bowel sounds are normal. There is no distension.     Palpations: Abdomen is soft.     Tenderness: There is no abdominal tenderness.  Musculoskeletal:        General: Normal range of motion.     Cervical back: Normal range of motion.  Skin:    General: Skin is warm and dry.     Findings: Erythema and rash present. Rash is scaling.       Neurological:     Mental Status: He is alert and oriented to person, place, and time.  Psychiatric:        Judgment: Judgment normal.    Media Information     CMP Latest Ref Rng & Units 01/06/2019    Glucose 70 - 99 mg/dL 102(H)  BUN 8 - 23 mg/dL 21  Creatinine 0.61 - 1.24 mg/dL 1.36(H)  Sodium 135 - 145 mmol/L 136  Potassium 3.5 - 5.1 mmol/L 3.8  Chloride 98 - 111 mmol/L 103  CO2 22 - 32 mmol/L 21(L)  Calcium 8.9 - 10.3 mg/dL 9.0  Total Protein 6.4 - 8.2 g/dL -  Total Bilirubin 0.2 - 1.0 mg/dL -  Alkaline Phos 50 - 136 Unit/L -  AST 15 - 37 Unit/L -  ALT 12 - 78 U/L -   CBC Latest Ref Rng & Units 01/06/2019  WBC 4.0 - 10.5 K/uL 7.5  Hemoglobin 13.0 - 17.0 g/dL 12.0(L)  Hematocrit 39.0 - 52.0 % 39.2  Platelets 150 - 400 K/uL 146(L)    No images are attached to the encounter.  No results found.  Assessment and plan- Patient is a 69 y.o. male who presents to symptom management for complaints of cellulitis that developed approximately 4 days ago.  MGUS/smoldering myeloma: Recent diagnosis.  He was referred to Dr. Grayland Ormond from his dermatologist for a concerning rash thought to be possibly leukoclassic vasculitis.  Work-up included an elevated M spike.  He was scheduled to have a bone marrow biopsy and bone survey but was admitted to [facility] for heart failure.  While admitted, a bone marrow was performed revealing 5 to 30% plasma cells consistent with smoldering myeloma.  He is scheduled to be seen by Dr. Grayland Ormond on 02/24/2019 to discuss bone marrow biopsy, lab results and possible treatment.   Cellulitis of right lower extremity: Unclear etiology but likely related to poor venous and arterial blood flow.  On assessment there is skin erythema, edema and warmth.  Lesion is raised above the surrounding skin with clear demarcation of involved and uninvolved tissue.  No fluctuant head.  He denies any pain unless he applies pressure.  He denies a fever.  In the presence of underlying comorbidities such as possible smoldering myeloma would recommend treatment with antibiotics.  Plan: Rx Bactrim 800-160  mg twice daily x7 days. Close surveillance. Patient to call if cellulitis becomes worse or  extends outside of marked lines drawn at our visit today or if he develops a fever or pain.  Disposition: RTC as scheduled on 02/24/2019 for follow-up with Dr. Grayland Ormond.   Visit Diagnosis 1. Smoldering myeloma ([place])   2. Cellulitis of right lower extremity     Patient expressed understanding and was in agreement with this plan. He also understands that He can call clinic at any time with any questions, concerns, or complaints.   Greater than 50% was spent in counseling and coordination of care with this patient including but not limited to discussion of the relevant topics above (See A&P) including, but not limited to diagnosis and management of acute and chronic medical conditions.   Thank you for allowing me to participate in the care of this very pleasant patient.    Jacquelin Hawking, NP [place] at [facility] Cell - [phone] Pager- [phone] 02/27/2019 2:08 PM

## 2019-02-23 ENCOUNTER — Other Ambulatory Visit: Payer: Self-pay

## 2019-02-23 ENCOUNTER — Encounter: Payer: Self-pay | Admitting: Oncology

## 2019-02-23 DIAGNOSIS — I420 Dilated cardiomyopathy: Secondary | ICD-10-CM | POA: Diagnosis not present

## 2019-02-23 NOTE — Progress Notes (Signed)
Patient recently discharged from [facility] unable to do med rec. Patient instructed to bring medication list with him to visit tomorrow. Care team notified.

## 2019-02-24 ENCOUNTER — Other Ambulatory Visit: Payer: Self-pay

## 2019-02-24 ENCOUNTER — Inpatient Hospital Stay (HOSPITAL_BASED_OUTPATIENT_CLINIC_OR_DEPARTMENT_OTHER): Payer: PPO | Admitting: Oncology

## 2019-02-24 VITALS — BP 112/71 | HR 81 | Temp 96.1°F | Resp 17 | Wt 215.5 lb

## 2019-02-24 DIAGNOSIS — C9000 Multiple myeloma not having achieved remission: Secondary | ICD-10-CM | POA: Diagnosis not present

## 2019-02-24 DIAGNOSIS — D472 Monoclonal gammopathy: Secondary | ICD-10-CM

## 2019-02-24 NOTE — Progress Notes (Signed)
[place]  Telephone:([phone] Fax:([phone]  ID: [name] OB: 13-Jul-1950  MR#: [id number]  YHO#:[id number]  Patient Care Team: Albina Billet, MD as PCP - General (Internal Medicine)  CHIEF COMPLAINT: Smoldering myeloma.  INTERVAL HISTORY: Patient returns to clinic today for further evaluation and discussion of his bone marrow biopsy results.  He was admitted to [facility] several weeks ago for cardiac issues, but was able to get his bone marrow biopsy while inpatient there.  He has a cellulitis on his right lower extremity that was evaluated in symptom management clinic earlier this week.  He continues to have chronic weakness and fatigue.  He has no neurologic complaints.  He denies any fevers.  He has a fair appetite.  He denies any chest pain, shortness of breath, cough, or hemoptysis.  He denies any nausea, vomiting, constipation, or diarrhea.  He has no melena or hematochezia.  He has no urinary complaints.  Patient offers no further specific complaints today.  REVIEW OF SYSTEMS:   Review of Systems  Constitutional: Positive for malaise/fatigue and weight loss. Negative for fever.  Respiratory: Negative.  Negative for cough, hemoptysis and shortness of breath.   Cardiovascular: Positive for leg swelling. Negative for chest pain.  Gastrointestinal: Negative.  Negative for abdominal pain.  Genitourinary: Negative.  Negative for dysuria.  Musculoskeletal: Negative.  Negative for back pain.  Skin: Negative.  Negative for rash.  Neurological: Positive for weakness. Negative for dizziness, focal weakness and headaches.  Psychiatric/Behavioral: Negative.  The patient is not nervous/anxious.     As per HPI. Otherwise, a complete review of systems is negative.  PAST MEDICAL HISTORY: Past Medical History:  Diagnosis Date  . AF (atrial fibrillation) ([place])   . Cardiomyopathy ([place])   . CHF (congestive heart failure) ([place])   . Diabetes mellitus without  complication ([place])   . Hypertension   . Serum lipids high     PAST SURGICAL HISTORY: Past Surgical History:  Procedure Laterality Date  . COLONOSCOPY    . COLONOSCOPY WITH PROPOFOL N/A 07/26/2015   Procedure: COLONOSCOPY WITH PROPOFOL;  Surgeon: Manya Silvas, MD;  Location: [facility] ENDOSCOPY;  Service: Endoscopy;  Laterality: N/A;    FAMILY HISTORY: History reviewed. No pertinent family history.  ADVANCED DIRECTIVES (Y/N):  N  HEALTH MAINTENANCE: Social History   Tobacco Use  . Smoking status: Former Research scientist (life sciences)  . Smokeless tobacco: Current User  Substance Use Topics  . Alcohol use: No  . Drug use: No     Colonoscopy:  PAP:  Bone density:  Lipid panel:  Allergies  Allergen Reactions  . Penicillins Hives    Current Outpatient Medications  Medication Sig Dispense Refill  . allopurinol (ZYLOPRIM) 300 MG tablet Take 300 mg by mouth daily.    [name] apixaban (ELIQUIS) 5 MG TABS tablet Take by mouth 2 (two) times daily.     [name] atorvastatin (LIPITOR) 10 MG tablet Take 10 mg by mouth daily.    . clobetasol cream (TEMOVATE) 2.06 % Apply 1 application topically 2 (two) times daily.    . colchicine 0.6 MG tablet Take 0.3 mg by mouth daily.     . furosemide (LASIX) 40 MG tablet Take by mouth daily.     [name] levothyroxine (SYNTHROID, LEVOTHROID) 100 MCG tablet Take 100 mcg by mouth every morning.  4  . magnesium oxide (MAG-OX) 400 MG tablet Take by mouth 2 (two) times daily.     . metoprolol succinate (TOPROL-XL) 25 MG 24 hr tablet  25 mg daily.     . pantoprazole (PROTONIX) 20 MG tablet Take 20 mg by mouth daily.     . potassium chloride (K-DUR,KLOR-CON) 10 MEQ tablet Take 10 mEq by mouth daily.     . sacubitril-valsartan (ENTRESTO) 24-26 MG Take 1 tablet by mouth 2 (two) times daily.     [name] sulfamethoxazole-trimethoprim (BACTRIM DS) 800-160 MG tablet Take 1 tablet by mouth 2 (two) times daily. 14 tablet 0   No current facility-administered medications for this visit.     OBJECTIVE: Vitals:   02/24/19 0929  BP: 112/71  Pulse: 81  Resp: 17  Temp: (!) 96.1 F (35.6 C)  SpO2: 100%     Body mass index is 27.67 kg/m.    ECOG FS:2 - Symptomatic, <50% confined to bed  General: Well-developed, well-nourished, no acute distress. Eyes: Pink conjunctiva, anicteric sclera. HEENT: Normocephalic, moist mucous membranes. Lungs: No audible wheezing or coughing. Heart: Regular rate and rhythm. Abdomen: Soft, nontender, no obvious distention. Musculoskeletal: Right lower extremity with cellulitis proximally unchanged from 2 days ago. Neuro: Alert, answering all questions appropriately. Cranial nerves grossly intact. Skin: No rashes or petechiae noted. Psych: Normal affect.   LAB RESULTS:  Lab Results  Component Value Date   NA 136 01/06/2019   K 3.8 01/06/2019   CL 103 01/06/2019   CO2 21 (L) 01/06/2019   GLUCOSE 102 (H) 01/06/2019   BUN 21 01/06/2019   CREATININE 1.36 (H) 01/06/2019   CALCIUM 9.0 01/06/2019   PROT 8.9 (H) 02/22/2012   ALBUMIN 4.2 02/22/2012   AST 32 02/22/2012   ALT 25 02/22/2012   ALKPHOS 91 02/22/2012   BILITOT 0.5 02/22/2012   GFRNONAA 53 (L) 01/06/2019   GFRAA >60 01/06/2019    Lab Results  Component Value Date   WBC 7.5 01/06/2019   NEUTROABS 6.0 01/06/2019   HGB 12.0 (L) 01/06/2019   HCT 39.2 01/06/2019   MCV 110.1 (H) 01/06/2019   PLT 146 (L) 01/06/2019   Lab Results  Component Value Date   TOTALPROTELP 8.6 (H) 01/06/2019   ALBUMINELP 3.3 01/06/2019   A1GS 0.3 01/06/2019   A2GS 0.6 01/06/2019   BETS 1.2 01/06/2019   GAMS 3.1 (H) 01/06/2019   MSPIKE 2.6 (H) 01/06/2019   SPEI Comment 01/06/2019      STUDIES: No results found.  ASSESSMENT: Smoldering myeloma  PLAN:    1.  Smoldering myeloma: Bone marrow biopsy completed at [facility] on February 14, 2019 revealed approximately 26% plasma cells in aspirate.  FISH and cytogenetics are pending.  Metastatic bone survey on January 09, 2019 did not reveal any  suspicious bony lesions.  His most recent hemoglobin was decreased at 11.7 with a mild thrombocytopenia of 106.  His most recent M spike of 2.6 with a significantly elevated IgG component of 4083.  His kappa free light chains are also elevated at 947.8 with a kappa/lambda light chain ratio of 21.84.  Can consider PET scan in the near future to look for occult lesions otherwise, no intervention is needed at this time.  Patient expressed understanding that he likely will require treatment in the near future.  Return to clinic in 3 months with repeat laboratory work and further evaluation. 2.  Thrombocytopenia: Chronic and unchanged.  Patient noted to have a decreased platelet count since at least July 2017. 3.  Hyperbilirubinemia: Patient's most recent laboratory work at an outside facility revealed a bilirubin of 1.8.  Unclear etiology.  Continue to monitor. 4.  Anemia: Chronic and  unchanged.  Patient's most recent hemoglobin was 11.7. 5.  Renal insufficiency: Patient's most recent creatinine is 1.52 which is slightly above his baseline.  Monitor. 6.  Cellulitis: Continue Bactrim as prescribed. 7.  Cardiac disease: Continue follow-up and treatment per cardiology.    Patient expressed understanding and was in agreement with this plan. He also understands that He can call clinic at any time with any questions, concerns, or complaints.   Cancer Staging No matching staging information was found for the patient.  Lloyd Huger, MD   02/24/2019 11:15 AM

## 2019-02-27 ENCOUNTER — Other Ambulatory Visit: Payer: Self-pay | Admitting: Oncology

## 2019-02-27 ENCOUNTER — Inpatient Hospital Stay: Payer: PPO | Admitting: Oncology

## 2019-02-27 MED ORDER — CEPHALEXIN 500 MG PO CAPS: 500.0000 mg | ORAL_CAPSULE | Freq: Four times a day (QID) | ORAL | 0 refills | Status: DC

## 2019-02-27 NOTE — Progress Notes (Signed)
BT:DHRCBU-LA Call  Cellulitis  Spoke to son Jaylyn Booher to see if there was any improvement of Mr. Whistler right leg.  Son states that area still appears red and hot to touch.  Area still is within lines that were drawn on first assessment but it has not improved any.  Patient states there is still pain when touching or applying pressure to leg.  He has had 4 days of antibiotic therapy with Bactrim.  I will add Keflex 500 mg tablets 4 times daily x10 days in addition to already prescribed Bactrim.  Patient's chart reveals he has a allergy to penicillins.  Reviewed with patient and son and they are unclear of when this allergy occurred and may have been in his childhood.  Reviewed side effects of prescription medication and they are okay to try.  We will touch base later in the week to see if symptoms are improving.  Faythe Casa, NP 02/27/2019 2:19 PM

## 2019-02-28 DIAGNOSIS — L0390 Cellulitis, unspecified: Secondary | ICD-10-CM | POA: Diagnosis not present

## 2019-02-28 DIAGNOSIS — M25461 Effusion, right knee: Secondary | ICD-10-CM | POA: Diagnosis not present

## 2019-02-28 DIAGNOSIS — I5020 Unspecified systolic (congestive) heart failure: Secondary | ICD-10-CM | POA: Diagnosis not present

## 2019-02-28 DIAGNOSIS — Z880 Allergy status to penicillin: Secondary | ICD-10-CM | POA: Diagnosis not present

## 2019-02-28 DIAGNOSIS — M109 Gout, unspecified: Secondary | ICD-10-CM | POA: Diagnosis not present

## 2019-02-28 DIAGNOSIS — M7989 Other specified soft tissue disorders: Secondary | ICD-10-CM | POA: Diagnosis not present

## 2019-02-28 DIAGNOSIS — M25561 Pain in right knee: Secondary | ICD-10-CM | POA: Diagnosis not present

## 2019-02-28 DIAGNOSIS — I5022 Chronic systolic (congestive) heart failure: Secondary | ICD-10-CM | POA: Diagnosis not present

## 2019-02-28 DIAGNOSIS — N189 Chronic kidney disease, unspecified: Secondary | ICD-10-CM | POA: Diagnosis not present

## 2019-02-28 DIAGNOSIS — E039 Hypothyroidism, unspecified: Secondary | ICD-10-CM | POA: Diagnosis not present

## 2019-02-28 DIAGNOSIS — L02415 Cutaneous abscess of right lower limb: Secondary | ICD-10-CM | POA: Diagnosis not present

## 2019-02-28 DIAGNOSIS — I130 Hypertensive heart and chronic kidney disease with heart failure and stage 1 through stage 4 chronic kidney disease, or unspecified chronic kidney disease: Secondary | ICD-10-CM | POA: Diagnosis not present

## 2019-02-28 DIAGNOSIS — K219 Gastro-esophageal reflux disease without esophagitis: Secondary | ICD-10-CM | POA: Diagnosis not present

## 2019-02-28 DIAGNOSIS — I4891 Unspecified atrial fibrillation: Secondary | ICD-10-CM | POA: Diagnosis not present

## 2019-02-28 DIAGNOSIS — Z7901 Long term (current) use of anticoagulants: Secondary | ICD-10-CM | POA: Diagnosis not present

## 2019-03-02 DIAGNOSIS — S80219A Abrasion, unspecified knee, initial encounter: Secondary | ICD-10-CM | POA: Diagnosis not present

## 2019-03-03 DIAGNOSIS — M25569 Pain in unspecified knee: Secondary | ICD-10-CM | POA: Diagnosis not present

## 2019-03-03 DIAGNOSIS — I10 Essential (primary) hypertension: Secondary | ICD-10-CM | POA: Diagnosis not present

## 2019-03-03 DIAGNOSIS — I480 Paroxysmal atrial fibrillation: Secondary | ICD-10-CM | POA: Diagnosis not present

## 2019-03-03 DIAGNOSIS — I272 Pulmonary hypertension, unspecified: Secondary | ICD-10-CM | POA: Diagnosis not present

## 2019-03-03 DIAGNOSIS — Z7984 Long term (current) use of oral hypoglycemic drugs: Secondary | ICD-10-CM | POA: Diagnosis not present

## 2019-03-03 DIAGNOSIS — Z950 Presence of cardiac pacemaker: Secondary | ICD-10-CM | POA: Diagnosis not present

## 2019-03-03 DIAGNOSIS — I5020 Unspecified systolic (congestive) heart failure: Secondary | ICD-10-CM | POA: Diagnosis not present

## 2019-03-03 DIAGNOSIS — R7303 Prediabetes: Secondary | ICD-10-CM | POA: Diagnosis not present

## 2019-03-03 DIAGNOSIS — Z7901 Long term (current) use of anticoagulants: Secondary | ICD-10-CM | POA: Diagnosis not present

## 2019-03-03 DIAGNOSIS — I4819 Other persistent atrial fibrillation: Secondary | ICD-10-CM | POA: Diagnosis not present

## 2019-03-03 DIAGNOSIS — R7989 Other specified abnormal findings of blood chemistry: Secondary | ICD-10-CM | POA: Diagnosis not present

## 2019-03-03 DIAGNOSIS — R001 Bradycardia, unspecified: Secondary | ICD-10-CM | POA: Diagnosis not present

## 2019-03-03 DIAGNOSIS — I2510 Atherosclerotic heart disease of native coronary artery without angina pectoris: Secondary | ICD-10-CM | POA: Diagnosis not present

## 2019-03-03 DIAGNOSIS — Z880 Allergy status to penicillin: Secondary | ICD-10-CM | POA: Diagnosis not present

## 2019-03-03 DIAGNOSIS — N189 Chronic kidney disease, unspecified: Secondary | ICD-10-CM | POA: Diagnosis not present

## 2019-03-03 DIAGNOSIS — K219 Gastro-esophageal reflux disease without esophagitis: Secondary | ICD-10-CM | POA: Diagnosis not present

## 2019-03-03 DIAGNOSIS — I130 Hypertensive heart and chronic kidney disease with heart failure and stage 1 through stage 4 chronic kidney disease, or unspecified chronic kidney disease: Secondary | ICD-10-CM | POA: Diagnosis not present

## 2019-03-03 DIAGNOSIS — N179 Acute kidney failure, unspecified: Secondary | ICD-10-CM | POA: Diagnosis not present

## 2019-03-03 DIAGNOSIS — Z86010 Personal history of colonic polyps: Secondary | ICD-10-CM | POA: Diagnosis not present

## 2019-03-03 DIAGNOSIS — E039 Hypothyroidism, unspecified: Secondary | ICD-10-CM | POA: Diagnosis not present

## 2019-03-06 ENCOUNTER — Other Ambulatory Visit: Payer: Self-pay | Admitting: Podiatry

## 2019-03-13 NOTE — Telephone Encounter (Signed)
Rx for Doxycycline Hyclate 100mg  sent to [facility] in [place]

## 2019-03-21 DIAGNOSIS — G250 Essential tremor: Secondary | ICD-10-CM | POA: Diagnosis not present

## 2019-03-21 DIAGNOSIS — R42 Dizziness and giddiness: Secondary | ICD-10-CM | POA: Diagnosis not present

## 2019-03-23 DIAGNOSIS — I420 Dilated cardiomyopathy: Secondary | ICD-10-CM | POA: Diagnosis not present

## 2019-03-28 ENCOUNTER — Other Ambulatory Visit: Payer: Self-pay

## 2019-03-28 ENCOUNTER — Encounter: Payer: Self-pay | Admitting: Cardiovascular Disease

## 2019-03-28 ENCOUNTER — Ambulatory Visit (INDEPENDENT_AMBULATORY_CARE_PROVIDER_SITE_OTHER): Payer: PPO | Admitting: Cardiovascular Disease

## 2019-03-28 VITALS — BP 116/62 | HR 72 | Ht 74.0 in | Wt 227.0 lb

## 2019-03-28 DIAGNOSIS — I25118 Atherosclerotic heart disease of native coronary artery with other forms of angina pectoris: Secondary | ICD-10-CM | POA: Diagnosis not present

## 2019-03-28 DIAGNOSIS — I5022 Chronic systolic (congestive) heart failure: Secondary | ICD-10-CM | POA: Diagnosis not present

## 2019-03-28 DIAGNOSIS — I428 Other cardiomyopathies: Secondary | ICD-10-CM

## 2019-03-28 DIAGNOSIS — I4729 Other ventricular tachycardia: Secondary | ICD-10-CM

## 2019-03-28 DIAGNOSIS — I4819 Other persistent atrial fibrillation: Secondary | ICD-10-CM

## 2019-03-28 DIAGNOSIS — I472 Ventricular tachycardia: Secondary | ICD-10-CM | POA: Diagnosis not present

## 2019-03-28 DIAGNOSIS — R42 Dizziness and giddiness: Secondary | ICD-10-CM | POA: Insufficient documentation

## 2019-03-28 DIAGNOSIS — G250 Essential tremor: Secondary | ICD-10-CM | POA: Insufficient documentation

## 2019-03-28 MED ORDER — FUROSEMIDE 40 MG PO TABS: 40.0000 mg | ORAL_TABLET | Freq: Two times a day (BID) | ORAL | 3 refills | Status: DC

## 2019-03-28 NOTE — Progress Notes (Signed)
Cardiology Office Note  Date:  03/28/2019   ID:  [name], [name] 03-28-1950, MRN [id number]  PCP:  Albina Billet, MD   Chief Complaint  Patient presents with  . OTHER    Est care Hx CHF/AFIB. Meds reviewed verbally with pt.    HPI:  [name] with past medical history of Nonischemic cardiomyopathy Chronic systolic CHF CKD Cirrhosis, NASH Atrial fibrillation, s/p DCCV 02/16/19 LHC 02/15/19 demonstrated 60% ostial D2 stenosis. He presents to establish care in the [place] office for his nonischemic cardiomyopathy They are presenting to the [place] office to establish care as they have difficulty driving to [place] reviewed from [facility] Admission January 2021 for decompensated heart failure, acute on chronic systolic CHF Echocardiogram confirm ejection fraction 25 to 30% moderate mitral valve regurgitation reduced RV function and severe TR  Left heart catheterization with nonobstructive disease as detailed above  Transition from lisinopril to [facility], Had a cardioversion for atrial fibrillation normal sinus rhythm restored Notes indicating prior longstanding atrial fibrillation Maintained on Eliquis Digoxin discontinued Following cardioversion initially with junctional bradycardia, slow return of sinus node function Baseline weight appears 213 pounds, discharged on Lasix 40 daily Discharged with a LifeVest given nonsustained VT during admission, was referred to EP  Has life vest in place today  Weight was 280 two years ago, poor appetite Weight 250 summer of 2020 Was not eating for long period  Weight 217 at home today D/c from hospital 213 Weight in our office today 227 pounds, reports this could be from his vest, shoes He does have some lower extremity edema there is not closely monitoring this  Rare dizziness, possible orthostasis, not very bothersome Tolerating entresto/metoprolol Does not check blood pressure at home  Lab  work reviewed HCT 34 in feb 2021 HCT 43 in 02/13/2019 CR 1.84,  Total bili chronically elevated, "fatty liver dz" 1.8  EKG personally reviewed by myself on todays visit Shows normal sinus rhythm rate 73 bpm, PVCs  Daughter who presents with him today reports the plan is as below: Has MRI scheduled of the heart next week "then decide about pacer/ICD" Also "discuss afib ablation  PMH:   has a past medical history of AF (atrial fibrillation) ([place]), Cardiomyopathy ([place]), CHF (congestive heart failure) ([place]), Diabetes mellitus without complication ([place]), Fatty liver, H/O ETOH abuse, adenomatous polyp of colon, thrombocytopenia, Hypertension, Serum lipids high, and Thyroid disease.  PSH:    Past Surgical History:  Procedure Laterality Date  . CARDIAC CATHETERIZATION     no stents  . COLONOSCOPY    . COLONOSCOPY WITH PROPOFOL N/A 07/26/2015   Procedure: COLONOSCOPY WITH PROPOFOL;  Surgeon: Manya Silvas, MD;  Location[facility] ENDOSCOPY;  Service: Endoscopy;  Laterality: N/A;  . HAMMER TOE SURGERY      Current Outpatient Medications  Medication Sig Dispense Refill  . allopurinol (ZYLOPRIM) 300 MG tablet Take 300 mg by mouth daily.    [name] apixaban (ELIQUIS) 5 MG TABS tablet Take by mouth 2 (two) times daily.     [name] atorvastatin (LIPITOR) 10 MG tablet Take 10 mg by mouth daily.    . clobetasol cream (TEMOVATE) 4.56 % Apply 1 application topically 2 (two) times daily.    . colchicine 0.6 MG tablet Take 0.3 mg by mouth daily.     . furosemide (LASIX) 40 MG tablet Take 1 tablet (40 mg total) by mouth 2 (two) times daily. Take one tablet (40 mg) once daily with another pill  at 2 PM for extra swelling three days a week. 180 tablet 3  . levothyroxine (SYNTHROID, LEVOTHROID) 100 MCG tablet Take 100 mcg by mouth every morning.  4  . magnesium oxide (MAG-OX) 400 MG tablet Take 2 tablets by mouth 2 (two) times daily.     . metoprolol succinate (TOPROL-XL) 25 MG 24 hr tablet 25 mg daily.     .  pantoprazole (PROTONIX) 20 MG tablet Take 20 mg by mouth daily.     . sacubitril-valsartan (ENTRESTO) 24-26 MG Take 1 tablet by mouth 2 (two) times daily.      No current facility-administered medications for this visit.     Allergies:   Penicillins   Social History:  The patient  reports that he has quit smoking. His smokeless tobacco use includes snuff. He reports that he does not drink alcohol or use drugs.   Family History:   family history includes Heart Problems in his father, mother, and sister.    Review of Systems: Review of Systems  Constitutional: Positive for malaise/fatigue.  HENT: Negative.   Respiratory: Positive for shortness of breath.   Cardiovascular: Positive for leg swelling.  Gastrointestinal: Negative.   Musculoskeletal: Negative.   Neurological: Negative.   Psychiatric/Behavioral: Negative.   All other systems reviewed and are negative.   PHYSICAL EXAM: VS:  BP 116/62 (BP Location: Right Arm, Patient Position: Sitting, Cuff Size: Normal)   Pulse 72   Ht 6\' 2"  (1.88 m)   Wt 227 lb (103 kg)   SpO2 99%   BMI 29.15 kg/m  , BMI Body mass index is 29.15 kg/m. GEN: Well nourished, well developed, in no acute distress HEENT: normal Neck: no JVD, carotid bruits, or masses Cardiac: RRR; no murmurs, rubs, or gallops,trace to 1+ edema around ankles R>L Respiratory:  clear to auscultation bilaterally, normal work of breathing GI: soft, nontender, nondistended, + BS MS: no deformity or atrophy Skin: warm and dry, no rash Neuro:  Strength and sensation are intact Psych: euthymic mood, full affect   Recent Labs: 01/06/2019: BUN 21; Creatinine, Ser 1.36; Hemoglobin 12.0; Platelets 146; Potassium 3.8; Sodium 136    Lipid Panel No results found for: CHOL, HDL, LDLCALC, TRIG    Wt Readings from Last 3 Encounters:  03/28/19 227 lb (103 kg)  02/24/19 215 lb 8 oz (97.8 kg)  01/19/19 248 lb 12.8 oz (112.9 kg)     ASSESSMENT AND PLAN:  Problem List Items  Addressed This Visit      Cardiology Problems   Atrial fibrillation (HCC)   Relevant Medications   furosemide (LASIX) 40 MG tablet   Other Relevant Orders   EKG 12-Lead    Other Visit Diagnoses    Nonischemic cardiomyopathy ([place])    -  Primary   Relevant Medications   furosemide (LASIX) 40 MG tablet   NSVT (nonsustained ventricular tachycardia) (HCC)       Relevant Medications   furosemide (LASIX) 40 MG tablet   Other Relevant Orders   EKG 17-HXTA   Chronic systolic CHF (congestive heart failure) (HCC)       Relevant Medications   furosemide (LASIX) 40 MG tablet     Nonischemic cardiomyopathy Notes reviewed from [facility], etiology unclear,  Tolerating Entresto, metoprolol Given vague complaints of orthostasis, will hold off on adding spironolactone at this time Notes indicate low blood pressure on visit February 22, 5695 systolic pressure 96 -Weight gain is of concern if accurate, 227 on our office visit today, more mild weight gain at  home -Recommended he add Lasix 40 mg at 2 PM to 3 times per week in addition to his 86 in the morning -MRI planned at [facility] next week which will help guide management  Atrial fibrillation, persistent Appears to be holding normal sinus rhythm Notes indicating had junctional bradycardia following cardioversion possibly suggesting sick sinus syndrome May not be a good candidate for antiarrhythmics, likely high risk of recurrent arrhythmia -Tolerating anticoagulation -Daughter reports they have had discussions concerning ablation -Would likely benefit from meeting with EP following MRI  Chronic systolic CHF Plan as above, recommend he moderate his fluid intake, reports in the past he has had very high water intake. -Weight likely trending upwards, suggested additional 40 mg after lunch for her ankle swelling which she has on today's visit, would likely benefit from extra 2 to 3 days/week -Suggested if home weight goes over 220 pounds at the call our  office   Disposition:   F/U  2 months  Extensive records reviewed from [facility], hospitalization records reviewed Long discussion with daughter concerning plan  Total encounter time more than 60 minutes  Greater than 50% was spent in counseling and coordination of care with the patient    Signed, Esmond Plants, M.D., Ph.D. [place], [place]

## 2019-03-28 NOTE — Patient Instructions (Addendum)
Medication Instructions:  Please take extra lasix 40 mg at 2 pm three days a week  If you need a refill on your cardiac medications before your next appointment, please call your pharmacy.    Lab work: No new labs needed   If you have labs (blood work) drawn today and your tests are completely normal, you will receive your results only by: [name] MyChart Message (if you have MyChart) OR . A paper copy in the mail If you have any lab test that is abnormal or we need to change your treatment, we will call you to review the results.   Testing/Procedures: No new testing needed   Follow-Up: At [facility], you and your health needs are our priority.  As part of our continuing mission to provide you with exceptional heart care, we have created designated Provider Care Teams.  These Care Teams include your primary Cardiologist (physician) and Advanced Practice Providers (APPs -  Physician Assistants and Nurse Practitioners) who all work together to provide you with the care you need, when you need it.  . You will need a follow up appointment in 2 months   . Providers on your designated Care Team:   . Murray Hodgkins, NP . Christell Faith, PA-C . Marrianne Mood, PA-C  Any Other Special Instructions Will Be Listed Below (If Applicable).  COVID-19 Vaccine Information can be found at: ShippingScam.co.uk For questions related to vaccine distribution or appointments, please email vaccine@[email or web address] [email or web address] or call [phone].

## 2019-04-03 DIAGNOSIS — R7989 Other specified abnormal findings of blood chemistry: Secondary | ICD-10-CM | POA: Diagnosis not present

## 2019-04-03 DIAGNOSIS — I5020 Unspecified systolic (congestive) heart failure: Secondary | ICD-10-CM | POA: Diagnosis not present

## 2019-04-04 ENCOUNTER — Encounter: Payer: Self-pay | Admitting: *Deleted

## 2019-04-04 ENCOUNTER — Inpatient Hospital Stay
Admission: EM | Admit: 2019-04-04 | Discharge: 2019-04-07 | DRG: 442 | Disposition: A | Discharge status: Home Health | Payer: PPO | Attending: Internal Medicine | Admitting: Internal Medicine

## 2019-04-04 ENCOUNTER — Emergency Department: Payer: PPO

## 2019-04-04 ENCOUNTER — Other Ambulatory Visit: Payer: Self-pay

## 2019-04-04 DIAGNOSIS — I4510 Unspecified right bundle-branch block: Secondary | ICD-10-CM | POA: Diagnosis present

## 2019-04-04 DIAGNOSIS — N179 Acute kidney failure, unspecified: Secondary | ICD-10-CM

## 2019-04-04 DIAGNOSIS — L03116 Cellulitis of left lower limb: Secondary | ICD-10-CM | POA: Diagnosis not present

## 2019-04-04 DIAGNOSIS — E1122 Type 2 diabetes mellitus with diabetic chronic kidney disease: Secondary | ICD-10-CM | POA: Diagnosis present

## 2019-04-04 DIAGNOSIS — C9000 Multiple myeloma not having achieved remission: Secondary | ICD-10-CM | POA: Diagnosis present

## 2019-04-04 DIAGNOSIS — I5023 Acute on chronic systolic (congestive) heart failure: Secondary | ICD-10-CM | POA: Diagnosis not present

## 2019-04-04 DIAGNOSIS — F17290 Nicotine dependence, other tobacco product, uncomplicated: Secondary | ICD-10-CM | POA: Diagnosis not present

## 2019-04-04 DIAGNOSIS — L0390 Cellulitis, unspecified: Secondary | ICD-10-CM | POA: Diagnosis not present

## 2019-04-04 DIAGNOSIS — I130 Hypertensive heart and chronic kidney disease with heart failure and stage 1 through stage 4 chronic kidney disease, or unspecified chronic kidney disease: Secondary | ICD-10-CM | POA: Diagnosis not present

## 2019-04-04 DIAGNOSIS — E039 Hypothyroidism, unspecified: Secondary | ICD-10-CM | POA: Diagnosis present

## 2019-04-04 DIAGNOSIS — I482 Chronic atrial fibrillation, unspecified: Secondary | ICD-10-CM | POA: Diagnosis not present

## 2019-04-04 DIAGNOSIS — Z20822 Contact with and (suspected) exposure to covid-19: Secondary | ICD-10-CM | POA: Diagnosis present

## 2019-04-04 DIAGNOSIS — K7682 Hepatic encephalopathy: Secondary | ICD-10-CM | POA: Diagnosis present

## 2019-04-04 DIAGNOSIS — Z7901 Long term (current) use of anticoagulants: Secondary | ICD-10-CM

## 2019-04-04 DIAGNOSIS — K7290 Hepatic failure, unspecified without coma: Principal | ICD-10-CM | POA: Diagnosis present

## 2019-04-04 DIAGNOSIS — L539 Erythematous condition, unspecified: Secondary | ICD-10-CM | POA: Diagnosis not present

## 2019-04-04 DIAGNOSIS — N189 Chronic kidney disease, unspecified: Secondary | ICD-10-CM | POA: Diagnosis not present

## 2019-04-04 DIAGNOSIS — M109 Gout, unspecified: Secondary | ICD-10-CM | POA: Diagnosis not present

## 2019-04-04 DIAGNOSIS — Z880 Allergy status to penicillin: Secondary | ICD-10-CM | POA: Diagnosis not present

## 2019-04-04 DIAGNOSIS — D849 Immunodeficiency, unspecified: Secondary | ICD-10-CM | POA: Diagnosis not present

## 2019-04-04 DIAGNOSIS — N1831 Chronic kidney disease, stage 3a: Secondary | ICD-10-CM | POA: Diagnosis not present

## 2019-04-04 DIAGNOSIS — Z8249 Family history of ischemic heart disease and other diseases of the circulatory system: Secondary | ICD-10-CM

## 2019-04-04 DIAGNOSIS — I5022 Chronic systolic (congestive) heart failure: Secondary | ICD-10-CM | POA: Diagnosis not present

## 2019-04-04 DIAGNOSIS — K760 Fatty (change of) liver, not elsewhere classified: Secondary | ICD-10-CM | POA: Diagnosis not present

## 2019-04-04 DIAGNOSIS — R509 Fever, unspecified: Secondary | ICD-10-CM | POA: Diagnosis not present

## 2019-04-04 LAB — APTT: aPTT: 38 seconds — ABNORMAL HIGH (ref 24–36)

## 2019-04-04 LAB — COMPREHENSIVE METABOLIC PANEL
ALT: 22 U/L (ref 0–44)
AST: 34 U/L (ref 15–41)
Albumin: 3.1 g/dL — ABNORMAL LOW (ref 3.5–5.0)
Alkaline Phosphatase: 234 U/L — ABNORMAL HIGH (ref 38–126)
Anion gap: 9 (ref 5–15)
BUN: 28 mg/dL — ABNORMAL HIGH (ref 8–23)
CO2: 26 mmol/L (ref 22–32)
Calcium: 8.9 mg/dL (ref 8.9–10.3)
Chloride: 104 mmol/L (ref 98–111)
Creatinine, Ser: 1.51 mg/dL — ABNORMAL HIGH (ref 0.61–1.24)
GFR calc Af Amer: 54 mL/min — ABNORMAL LOW (ref >60)
GFR calc non Af Amer: 46 mL/min — ABNORMAL LOW (ref >60)
Glucose, Bld: 148 mg/dL — ABNORMAL HIGH (ref 70–99)
Potassium: 4.1 mmol/L (ref 3.5–5.1)
Sodium: 139 mmol/L (ref 135–145)
Total Bilirubin: 1.5 mg/dL — ABNORMAL HIGH (ref 0.3–1.2)
Total Protein: 8.7 g/dL — ABNORMAL HIGH (ref 6.5–8.1)

## 2019-04-04 LAB — CBC
HCT: 29.5 % — ABNORMAL LOW (ref 39.0–52.0)
Hemoglobin: 9.2 g/dL — ABNORMAL LOW (ref 13.0–17.0)
MCH: 35.1 pg — ABNORMAL HIGH (ref 26.0–34.0)
MCHC: 31.2 g/dL (ref 30.0–36.0)
MCV: 112.6 fL — ABNORMAL HIGH (ref 80.0–100.0)
Platelets: 108 10*3/uL — ABNORMAL LOW (ref 150–400)
RBC: 2.62 MIL/uL — ABNORMAL LOW (ref 4.22–5.81)
RDW: 15.9 % — ABNORMAL HIGH (ref 11.5–15.5)
WBC: 7.1 10*3/uL (ref 4.0–10.5)
nRBC: 0.3 % — ABNORMAL HIGH (ref 0.0–0.2)

## 2019-04-04 LAB — TROPONIN I (HIGH SENSITIVITY): Troponin I (High Sensitivity): 29 ng/L — ABNORMAL HIGH (ref <18)

## 2019-04-04 LAB — PROTIME-INR
INR: 1.4 — ABNORMAL HIGH (ref 0.8–1.2)
Prothrombin Time: 17 seconds — ABNORMAL HIGH (ref 11.4–15.2)

## 2019-04-04 LAB — LACTIC ACID, PLASMA: Lactic Acid, Venous: 1.8 mmol/L (ref 0.5–1.9)

## 2019-04-04 MED ORDER — LEVOFLOXACIN IN D5W 750 MG/150ML IV SOLN
750.0000 mg | Freq: Once | INTRAVENOUS | Status: AC
  Administered 2019-04-04: 750 mg via INTRAVENOUS
  Filled 2019-04-04: qty 150

## 2019-04-04 MED ORDER — VANCOMYCIN HCL IN DEXTROSE 1-5 GM/200ML-% IV SOLN
1000.0000 mg | Freq: Once | INTRAVENOUS | Status: AC
  Administered 2019-04-05: 1000 mg via INTRAVENOUS
  Filled 2019-04-04: qty 200

## 2019-04-04 MED ORDER — SODIUM CHLORIDE 0.9 % IV BOLUS: 1000.0000 mL | Freq: Once | INTRAVENOUS | Status: DC

## 2019-04-04 NOTE — ED Triage Notes (Signed)
Pt to triage via w/c with no distress noted, mask in place; life vest in place; Pt reports fever today with no accomp symptoms; denies c/o

## 2019-04-04 NOTE — ED Notes (Signed)
Asked EDP if he wanted the bolus due to patient's HF status

## 2019-04-04 NOTE — ED Notes (Signed)
Patient arrives to room, reports that his LifeVest battery is low. No screen visible, girlfriend does not have an extra battery. Patient hooked up to Zoll pads for safety

## 2019-04-04 NOTE — ED Provider Notes (Signed)
[facility] Emergency Department Provider Note  Time seen: 10:55 PM  I have reviewed the triage vital signs and the nursing notes.   HISTORY  Chief Complaint Fever   HPI [name] is a 69 y.o. male with a past medical history of atrial fibrillation, cardiomyopathy, severe CHF, wears a LifeVest, diabetes, remote history of alcohol abuse, hypertension, presents to the emergency department for confusion, found to be febrile to 101.  According to family patient was recently treated at [facility] for heart failure found to have cellulitis in his left lower extremity.  Here patient denies any cough or shortness of breath, nausea vomiting or diarrhea, dysuria.  Patient does continue to have a swollen erythematous and warm left lower extremity.  Patient is afebrile currently but per report 101.5 at home earlier tonight.  Past Medical History:  Diagnosis Date  . AF (atrial fibrillation) ([place])   . Cardiomyopathy ([place])   . CHF (congestive heart failure) ([place])   . Diabetes mellitus without complication ([place])   . Fatty liver   . H/O ETOH abuse   . Hx of adenomatous polyp of colon   . Hx of thrombocytopenia   . Hypertension   . Serum lipids high   . Thyroid disease     Patient Active Problem List   Diagnosis Date Noted  . Alcoholic steatohepatitis 77/93/9030  . Adenomatous polyp of colon 02/11/2019  . AMS (altered mental status) 02/11/2019  . Elevated serum creatinine 02/11/2019  . Esophageal dysphagia 02/11/2019  . 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 Tremor 02/11/2019  . Volume overload 02/11/2019  . Pedal edema 02/07/2019  . Pain due to onychomycosis of toenails of both feet 07/25/2018  . Nonrheumatic mitral valve regurgitation 04/26/2018  . Nonrheumatic tricuspid valve regurgitation 04/26/2018  . Cardiomyopathy, nonischemic ([place]) 11/15/2015  . History of adenomatous polyp of colon 06/03/2015  . History of  thrombocytopenia 06/03/2015  . Atrial fibrillation ([place]) 04/28/2013  . HFrEF (heart failure with reduced ejection fraction) ([place]) 04/28/2013  . 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 Left ventricular dysfunction 04/28/2013    Past Surgical History:  Procedure Laterality Date  . CARDIAC CATHETERIZATION     no stents  . COLONOSCOPY    . COLONOSCOPY WITH PROPOFOL N/A 07/26/2015   Procedure: COLONOSCOPY WITH PROPOFOL;  Surgeon: Manya Silvas, MD;  Location[facility] ENDOSCOPY;  Service: Endoscopy;  Laterality: N/A;  . HAMMER TOE SURGERY      Prior to Admission medications   Medication Sig Start Date End Date Taking? Authorizing Provider  allopurinol (ZYLOPRIM) 300 MG tablet Take 300 mg by mouth daily.    [provider]  apixaban (ELIQUIS) 5 MG TABS tablet Take by mouth 2 (two) times daily.  02/18/19   [provider]  atorvastatin (LIPITOR) 10 MG tablet Take 10 mg by mouth daily.    [provider]  clobetasol cream (TEMOVATE) 0.92 % Apply 1 application topically 2 (two) times daily.    [provider]  colchicine 0.6 MG tablet Take 0.3 mg by mouth daily.  02/18/19   [provider]  furosemide (LASIX) 40 MG tablet Take 1 tablet (40 mg total) by mouth 2 (two) times daily. Take one tablet (40 mg) once daily with another pill at 2 PM for extra swelling three days a week. 03/28/19 05/16/19  Minna Merritts, MD  levothyroxine (SYNTHROID, LEVOTHROID) 100 MCG tablet Take 100 mcg by mouth every morning. 10/29/17   [provider]  magnesium oxide (MAG-OX) 400 MG tablet Take 2 tablets by mouth 2 (two) times daily.  02/18/19 02/18/20  [provider]  metoprolol succinate (TOPROL-XL) 25 MG 24 hr tablet 25 mg daily.  02/10/16   [provider]  pantoprazole (PROTONIX) 20 MG tablet Take 20 mg by mouth daily.  02/02/19   [provider]  sacubitril-valsartan (ENTRESTO) 24-26 MG Take 1 tablet by mouth 2 (two) times daily.  02/18/19    [provider]    Allergies  Allergen Reactions  . Penicillins Hives    Family History  Problem Relation Age of Onset  . Heart Problems Mother   . Heart Problems Father   . Heart Problems Sister     Social History Social History   Tobacco Use  . Smoking status: Former Research scientist (life sciences)  . Smokeless tobacco: Current User    Types: Snuff  Substance Use Topics  . Alcohol use: No  . Drug use: No    Review of Systems Constitutional: Positive for fever Cardiovascular: Negative for chest pain. Respiratory: Negative for shortness of breath.  Negative for cough. Gastrointestinal: Negative for abdominal pain, vomiting and diarrhea. Genitourinary: Negative for urinary compaints Musculoskeletal: Negative for musculoskeletal complaints Skin: Redness of left lower extremity Neurological: Negative for headache All other ROS negative  ____________________________________________   PHYSICAL EXAM:  VITAL SIGNS: ED Triage Vitals  Enc Vitals Group     BP 04/04/19 2151 (!) 136/59     Pulse Rate 04/04/19 2151 (!) 101     Resp 04/04/19 2151 (!) 22     Temp 04/04/19 2151 99.1 F (37.3 C)     Temp Source 04/04/19 2151 Oral     SpO2 04/04/19 2151 99 %     Weight 04/04/19 2155 207 lb (93.9 kg)     Height 04/04/19 2155 6\' 2"  (1.88 m)     Head Circumference --      Peak Flow --      Pain Score 04/04/19 2154 0     Pain Loc --      Pain Edu? --      Excl. in [place]? --     Constitutional: Alert, mild confusion.  No acute distress. Eyes: Normal exam ENT      Head: Normocephalic and atraumatic.      Mouth/Throat: Mucous membranes are moist. Cardiovascular: Normal rate, regular rhythm.  Respiratory: Normal respiratory effort without tachypnea nor retractions. Breath sounds are clear.  No obvious wheeze rales or rhonchi. Gastrointestinal: Soft and nontender. No distention.  Musculoskeletal: Patient has peripheral edema in the bilateral lower extremities 2+ in the right 3+ in the left.   Patient's left lower extremity below the knee is quite erythematous and warm. Neurologic:  Normal speech and language. No gross focal neurologic deficits  Skin:  Skin is warm, erythematous in the left lower extremity. Psychiatric: Mood and affect are normal.  ____________________________________________   RADIOLOGY  Mild pulmonary edema  Ultrasound negative for DVT.  Shows multiple nodules within the subdermal fat.  ____________________________________________   INITIAL IMPRESSION / ASSESSMENT AND PLAN / ED COURSE  Pertinent labs & imaging results that were available during my care of the patient were reviewed by me and considered in my medical decision making (see chart for details).   Patient presents to the emergency department for reported fever as well as mild confusion and weakness at home.  Family reports 1-1.5 fever earlier today.  Patient denies any abdominal pain cough shortness of breath or dysuria.  Patient does have an erythematous and warm left lower extremity that is consistent with cellulitis.  Given the reported fever at home we will check labs, cultures, start broad-spectrum antibiotics for presumed left lower extremity cellulitis.  Family strongly wishes for the patient to be admitted locally not transferred. Given the patient's significant CHF history, with LifeVest and lower extremity edema as well as a chest x-ray consistent mild pulmonary edema we will not be dosing significant IV fluids at this time due to risk of worsening pulmonary edema.  Given the left lower extremity erythema and warmth we will obtain ultrasound imaging to rule out DVT.  Patient's labs show an elevated ammonia of 82.  Otherwise largely nonrevealing normal white count lactate of 1.8.  We will dose lactulose for likely hepatic encephalopathy.  Patient's left lower extremity appears cellulitic, ultrasound negative for DVT.   [name] was evaluated in Emergency Department on 04/04/2019 for  the symptoms described in the history of present illness. He was evaluated in the context of the global COVID-19 pandemic, which necessitated consideration that the patient might be at risk for infection with the SARS-CoV-2 virus that causes COVID-19. Institutional protocols and algorithms that pertain to the evaluation of patients at risk for COVID-19 are in a state of rapid change based on information released by regulatory bodies including the CDC and federal and state organizations. These policies and algorithms were followed during the patient's care in the ED.  FINAL CLINICAL IMPRESSION(S) / ED DIAGNOSES  Cellulitis Hepatic encephalopathy   Harvest Dark, MD 04/05/19 [phone]

## 2019-04-05 ENCOUNTER — Other Ambulatory Visit: Payer: Self-pay

## 2019-04-05 ENCOUNTER — Encounter: Payer: Self-pay | Admitting: Family Medicine

## 2019-04-05 DIAGNOSIS — Z880 Allergy status to penicillin: Secondary | ICD-10-CM | POA: Diagnosis not present

## 2019-04-05 DIAGNOSIS — Z7901 Long term (current) use of anticoagulants: Secondary | ICD-10-CM | POA: Diagnosis not present

## 2019-04-05 DIAGNOSIS — N189 Chronic kidney disease, unspecified: Secondary | ICD-10-CM

## 2019-04-05 DIAGNOSIS — C9000 Multiple myeloma not having achieved remission: Secondary | ICD-10-CM | POA: Diagnosis present

## 2019-04-05 DIAGNOSIS — E039 Hypothyroidism, unspecified: Secondary | ICD-10-CM

## 2019-04-05 DIAGNOSIS — I5023 Acute on chronic systolic (congestive) heart failure: Secondary | ICD-10-CM

## 2019-04-05 DIAGNOSIS — K7290 Hepatic failure, unspecified without coma: Principal | ICD-10-CM

## 2019-04-05 DIAGNOSIS — N1831 Chronic kidney disease, stage 3a: Secondary | ICD-10-CM | POA: Diagnosis present

## 2019-04-05 DIAGNOSIS — M109 Gout, unspecified: Secondary | ICD-10-CM

## 2019-04-05 DIAGNOSIS — E1122 Type 2 diabetes mellitus with diabetic chronic kidney disease: Secondary | ICD-10-CM | POA: Diagnosis present

## 2019-04-05 DIAGNOSIS — I5022 Chronic systolic (congestive) heart failure: Secondary | ICD-10-CM | POA: Diagnosis present

## 2019-04-05 DIAGNOSIS — L03116 Cellulitis of left lower limb: Secondary | ICD-10-CM | POA: Diagnosis present

## 2019-04-05 DIAGNOSIS — L0390 Cellulitis, unspecified: Secondary | ICD-10-CM | POA: Diagnosis not present

## 2019-04-05 DIAGNOSIS — Z20822 Contact with and (suspected) exposure to covid-19: Secondary | ICD-10-CM | POA: Diagnosis present

## 2019-04-05 DIAGNOSIS — I482 Chronic atrial fibrillation, unspecified: Secondary | ICD-10-CM

## 2019-04-05 DIAGNOSIS — K760 Fatty (change of) liver, not elsewhere classified: Secondary | ICD-10-CM | POA: Diagnosis present

## 2019-04-05 DIAGNOSIS — I130 Hypertensive heart and chronic kidney disease with heart failure and stage 1 through stage 4 chronic kidney disease, or unspecified chronic kidney disease: Secondary | ICD-10-CM | POA: Diagnosis present

## 2019-04-05 DIAGNOSIS — Z8249 Family history of ischemic heart disease and other diseases of the circulatory system: Secondary | ICD-10-CM | POA: Diagnosis not present

## 2019-04-05 DIAGNOSIS — N179 Acute kidney failure, unspecified: Secondary | ICD-10-CM

## 2019-04-05 DIAGNOSIS — I4510 Unspecified right bundle-branch block: Secondary | ICD-10-CM | POA: Diagnosis present

## 2019-04-05 DIAGNOSIS — F17290 Nicotine dependence, other tobacco product, uncomplicated: Secondary | ICD-10-CM | POA: Diagnosis present

## 2019-04-05 DIAGNOSIS — K7682 Hepatic encephalopathy: Secondary | ICD-10-CM | POA: Diagnosis present

## 2019-04-05 LAB — COMPREHENSIVE METABOLIC PANEL
ALT: 19 U/L (ref 0–44)
AST: 31 U/L (ref 15–41)
Albumin: 2.7 g/dL — ABNORMAL LOW (ref 3.5–5.0)
Alkaline Phosphatase: 206 U/L — ABNORMAL HIGH (ref 38–126)
Anion gap: 5 (ref 5–15)
BUN: 26 mg/dL — ABNORMAL HIGH (ref 8–23)
CO2: 28 mmol/L (ref 22–32)
Calcium: 8.4 mg/dL — ABNORMAL LOW (ref 8.9–10.3)
Chloride: 106 mmol/L (ref 98–111)
Creatinine, Ser: 1.34 mg/dL — ABNORMAL HIGH (ref 0.61–1.24)
GFR calc Af Amer: >60 mL/min (ref >60)
GFR calc non Af Amer: 54 mL/min — ABNORMAL LOW (ref >60)
Glucose, Bld: 104 mg/dL — ABNORMAL HIGH (ref 70–99)
Potassium: 3.8 mmol/L (ref 3.5–5.1)
Sodium: 139 mmol/L (ref 135–145)
Total Bilirubin: 1.7 mg/dL — ABNORMAL HIGH (ref 0.3–1.2)
Total Protein: 7.6 g/dL (ref 6.5–8.1)

## 2019-04-05 LAB — CBC
HCT: 25.6 % — ABNORMAL LOW (ref 39.0–52.0)
Hemoglobin: 7.9 g/dL — ABNORMAL LOW (ref 13.0–17.0)
MCH: 34.8 pg — ABNORMAL HIGH (ref 26.0–34.0)
MCHC: 30.9 g/dL (ref 30.0–36.0)
MCV: 112.8 fL — ABNORMAL HIGH (ref 80.0–100.0)
Platelets: 89 10*3/uL — ABNORMAL LOW (ref 150–400)
RBC: 2.27 MIL/uL — ABNORMAL LOW (ref 4.22–5.81)
RDW: 16.1 % — ABNORMAL HIGH (ref 11.5–15.5)
WBC: 5.6 10*3/uL (ref 4.0–10.5)
nRBC: 0 % (ref 0.0–0.2)

## 2019-04-05 LAB — URINALYSIS, COMPLETE (UACMP) WITH MICROSCOPIC
Bilirubin Urine: NEGATIVE
Glucose, UA: NEGATIVE mg/dL
Hgb urine dipstick: NEGATIVE
Ketones, ur: NEGATIVE mg/dL
Leukocytes,Ua: NEGATIVE
Nitrite: NEGATIVE
Protein, ur: 30 mg/dL — AB
Specific Gravity, Urine: 1.019 (ref 1.005–1.030)
pH: 6 (ref 5.0–8.0)

## 2019-04-05 LAB — RESPIRATORY PANEL BY RT PCR (FLU A&B, COVID)
Influenza A by PCR: NEGATIVE
Influenza B by PCR: NEGATIVE
SARS Coronavirus 2 by RT PCR: NEGATIVE

## 2019-04-05 LAB — LACTIC ACID, PLASMA: Lactic Acid, Venous: 1.2 mmol/L (ref 0.5–1.9)

## 2019-04-05 LAB — AMMONIA
Ammonia: 82 umol/L — ABNORMAL HIGH (ref 9–35)
Ammonia: 62 umol/L — ABNORMAL HIGH (ref 9–35)

## 2019-04-05 LAB — TSH: TSH: 2.342 u[IU]/mL (ref 0.350–4.500)

## 2019-04-05 LAB — HIV ANTIBODY (ROUTINE TESTING W REFLEX): HIV Screen 4th Generation wRfx: NONREACTIVE

## 2019-04-05 LAB — TROPONIN I (HIGH SENSITIVITY)
Troponin I (High Sensitivity): 40 ng/L — ABNORMAL HIGH (ref <18)
Troponin I (High Sensitivity): 48 ng/L — ABNORMAL HIGH (ref <18)
Troponin I (High Sensitivity): 47 ng/L — ABNORMAL HIGH (ref <18)

## 2019-04-05 LAB — MRSA PCR SCREENING: MRSA by PCR: POSITIVE — AB

## 2019-04-05 MED ORDER — METOPROLOL SUCCINATE ER 50 MG PO TB24
25.0000 mg | ORAL_TABLET | Freq: Every day | ORAL | Status: DC
  Administered 2019-04-05 – 2019-04-07 (×3): 25 mg via ORAL
  Filled 2019-04-05 (×3): qty 1

## 2019-04-05 MED ORDER — SACUBITRIL-VALSARTAN 24-26 MG PO TABS: 1.0000 | ORAL_TABLET | Freq: Two times a day (BID) | ORAL | Status: DC

## 2019-04-05 MED ORDER — ALLOPURINOL 300 MG PO TABS
300.0000 mg | ORAL_TABLET | Freq: Every day | ORAL | Status: DC
  Administered 2019-04-05 – 2019-04-07 (×3): 300 mg via ORAL
  Filled 2019-04-05 (×3): qty 1

## 2019-04-05 MED ORDER — LACTULOSE 10 GM/15ML PO SOLN
30.0000 g | Freq: Once | ORAL | Status: AC
  Administered 2019-04-05: 30 g via ORAL
  Filled 2019-04-05: qty 60

## 2019-04-05 MED ORDER — APIXABAN 5 MG PO TABS
5.0000 mg | ORAL_TABLET | Freq: Two times a day (BID) | ORAL | Status: DC
  Administered 2019-04-05 – 2019-04-07 (×5): 5 mg via ORAL
  Filled 2019-04-05 (×5): qty 1

## 2019-04-05 MED ORDER — FUROSEMIDE 10 MG/ML IJ SOLN
40.0000 mg | Freq: Two times a day (BID) | INTRAMUSCULAR | Status: DC
  Administered 2019-04-05 – 2019-04-06 (×3): 40 mg via INTRAVENOUS
  Filled 2019-04-05 (×3): qty 4

## 2019-04-05 MED ORDER — CHLORHEXIDINE GLUCONATE CLOTH 2 % EX PADS
6.0000 | MEDICATED_PAD | Freq: Every day | CUTANEOUS | Status: DC
  Administered 2019-04-06 – 2019-04-07 (×2): 6 via TOPICAL

## 2019-04-05 MED ORDER — MAGNESIUM OXIDE 400 (241.3 MG) MG PO TABS
800.0000 mg | ORAL_TABLET | Freq: Two times a day (BID) | ORAL | Status: DC
  Administered 2019-04-05 – 2019-04-07 (×5): 800 mg via ORAL
  Filled 2019-04-05 (×5): qty 2

## 2019-04-05 MED ORDER — MUPIROCIN 2 % EX OINT
1.0000 "application " | TOPICAL_OINTMENT | Freq: Two times a day (BID) | CUTANEOUS | Status: DC
  Administered 2019-04-05 – 2019-04-07 (×3): 1 via NASAL
  Filled 2019-04-05: qty 22

## 2019-04-05 MED ORDER — PANTOPRAZOLE SODIUM 20 MG PO TBEC
20.0000 mg | DELAYED_RELEASE_TABLET | Freq: Every day | ORAL | Status: DC
  Administered 2019-04-05 – 2019-04-07 (×3): 20 mg via ORAL
  Filled 2019-04-05 (×3): qty 1

## 2019-04-05 MED ORDER — LEVOTHYROXINE SODIUM 100 MCG PO TABS
100.0000 ug | ORAL_TABLET | Freq: Every morning | ORAL | Status: DC
  Administered 2019-04-06 – 2019-04-07 (×2): 100 ug via ORAL
  Filled 2019-04-05: qty 1
  Filled 2019-04-05: qty 2
  Filled 2019-04-05: qty 1

## 2019-04-05 MED ORDER — ATORVASTATIN CALCIUM 20 MG PO TABS
10.0000 mg | ORAL_TABLET | Freq: Every day | ORAL | Status: DC
  Administered 2019-04-05 – 2019-04-07 (×3): 10 mg via ORAL
  Filled 2019-04-05 (×4): qty 1

## 2019-04-05 MED ORDER — VANCOMYCIN HCL IN DEXTROSE 1-5 GM/200ML-% IV SOLN
1000.0000 mg | Freq: Once | INTRAVENOUS | Status: AC
  Administered 2019-04-05: 1000 mg via INTRAVENOUS
  Filled 2019-04-05: qty 200

## 2019-04-05 MED ORDER — FUROSEMIDE 40 MG PO TABS: 40.0000 mg | ORAL_TABLET | Freq: Two times a day (BID) | ORAL | Status: DC

## 2019-04-05 MED ORDER — SODIUM CHLORIDE 0.9 % IV SOLN
1.0000 g | INTRAVENOUS | Status: DC
  Administered 2019-04-05: 10:00:00 1 g via INTRAVENOUS
  Filled 2019-04-05: qty 1
  Filled 2019-04-05: qty 10

## 2019-04-05 MED ORDER — LACTULOSE 10 GM/15ML PO SOLN
30.0000 g | Freq: Three times a day (TID) | ORAL | Status: DC
  Administered 2019-04-05 – 2019-04-07 (×7): 30 g via ORAL
  Filled 2019-04-05 (×7): qty 60

## 2019-04-05 MED ORDER — COLCHICINE 0.6 MG PO TABS
0.3000 mg | ORAL_TABLET | Freq: Every day | ORAL | Status: DC
  Administered 2019-04-05 – 2019-04-07 (×3): 0.3 mg via ORAL
  Filled 2019-04-05: qty 0.5
  Filled 2019-04-05: qty 1
  Filled 2019-04-05: qty 0.5
  Filled 2019-04-05 (×2): qty 1
  Filled 2019-04-05: qty 0.5

## 2019-04-05 MED ORDER — SODIUM CHLORIDE 0.9 % IV SOLN: INTRAVENOUS | Status: DC

## 2019-04-05 NOTE — ED Notes (Signed)
Patient provided with meal tray, assisted to reposition in order to eat. NAD, girlfriend Vickii Chafe remains at bedside. No issues with LifeVest

## 2019-04-05 NOTE — TOC Initial Note (Signed)
Transition of Care [facility]) - Initial/Assessment Note    Patient Details  Name: [name] MRN: [id number] Date of Birth: 1950-06-01  Transition of Care [facility] CM/SW Contact:    Su Hilt, RN Phone Number: 04/05/2019, 2:10 PM  Clinical Narrative:                 Met with the patient and the girlfriend at the bedside, the patient lives with her and she will be assisting him, he also has a son that helps, he has a RW, cane and BSC at home and doe snot need additional DME, He already had [place] set up with [facility] and she will be coming April 2nd He stated that he does not have additional needs  Expected Discharge Plan: [place] Barriers to Discharge: Continued Medical Work up   Patient Goals and CMS Choice Patient states their goals for this hospitalization and ongoing recovery are:: go home      Expected Discharge Plan and Services Expected Discharge Plan: [place]   Discharge Planning Services: CM Consult   Living arrangements for the past 2 months: Single Family Home                 DME Arranged: N/A         HH Arranged: NA          Prior Living Arrangements/Services Living arrangements for the past 2 months: Single Family Home Lives with:: Significant Other Patient language and need for interpreter reviewed:: Yes Do you feel safe going back to the place where you live?: Yes      Need for Family Participation in Patient Care: No (Comment) Care giver support system in place?: Yes (comment) Current home services: DME, Home PT(rolling walker, cane, bed side commode) Criminal Activity/Legal Involvement Pertinent to Current Situation/Hospitalization: No - Comment as needed  Activities of Daily Living Home Assistive Devices/Equipment: Walker (specify type), Dentures (specify type), Cane (specify quad or straight) ADL Screening (condition at time of admission) Patient's cognitive ability adequate to safely complete daily  activities?: Yes Is the patient deaf or have difficulty hearing?: No Does the patient have difficulty seeing, even when wearing glasses/contacts?: No Does the patient have difficulty concentrating, remembering, or making decisions?: Yes Patient able to express need for assistance with ADLs?: Yes Does the patient have difficulty dressing or bathing?: Yes Independently performs ADLs?: Yes (appropriate for developmental age) Does the patient have difficulty walking or climbing stairs?: Yes Weakness of Legs: Both Weakness of Arms/Hands: Both  Permission Sought/Granted   Permission granted to share information with : Yes, Verbal Permission Granted              Emotional Assessment Appearance:: Appears stated age Attitude/Demeanor/Rapport: Engaged Affect (typically observed): Appropriate Orientation: : Oriented to Self, Oriented to Place, Oriented to  Time, Oriented to Situation Alcohol / Substance Use: Not Applicable Psych Involvement: No (comment)  Admission diagnosis:  Hepatic encephalopathy ([place]) [K72.90] Cellulitis, unspecified cellulitis site [L03.90] Patient Active Problem List   Diagnosis Date Noted  . Hepatic encephalopathy ([place]) 04/05/2019  . Alcoholic steatohepatitis 75/10/2583  . Adenomatous polyp of colon 02/11/2019  . AMS (altered mental status) 02/11/2019  . Elevated serum creatinine 02/11/2019  . Esophageal dysphagia 02/11/2019  . 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 Tremor 02/11/2019  . Volume overload 02/11/2019  . Pedal edema 02/07/2019  . Pain due to onychomycosis of toenails of both feet 07/25/2018  .  Nonrheumatic mitral valve regurgitation 04/26/2018  . Nonrheumatic tricuspid valve regurgitation 04/26/2018  . Cardiomyopathy, nonischemic ([place]) 11/15/2015  . History of adenomatous polyp of colon 06/03/2015  . History of thrombocytopenia 06/03/2015  . Atrial fibrillation ([place]) 04/28/2013  . HFrEF (heart  failure with reduced ejection fraction) ([place]) 04/28/2013  . 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 Left ventricular dysfunction 04/28/2013   PCP:  Albina Billet, MD Pharmacy:   [facility], [address] [place] [address] Phone: [phone] Fax: [phone]  [id number] [place] [place] [id number]: [phone]: [phone]    Social Determinants of Health (SDOH) Interventions    Readmission Risk Interventions No flowsheet data found.

## 2019-04-05 NOTE — H&P (Addendum)
[facility] NAME: [name]    MR#:  [id number]  DATE OF BIRTH:  1950/09/27  DATE OF ADMISSION:  04/04/2019  PRIMARY CARE PHYSICIAN: Albina Billet, MD   REQUESTING/REFERRING PHYSICIAN: Harvest Dark, MD CHIEF COMPLAINT:   Chief Complaint  Patient presents with  . Fever    HISTORY OF PRESENT ILLNESS:  [name]  is a 69 y.o. male with a known history of atrial fibrillation, CHF, hypertension, type 2 diabetes mellitus and fatty liver, who presents to the emergency room with acute onset of altered mental status with altered mental status with confusion which has been intermittent over the last several weeks but got significantly worse since yesterday with associated fever with a T-max of 102.  Is been having mild lower extremity edema with more swelling on the left leg with erythema.  No chest pain or cough or wheezing or hemoptysis.[name]  He has been having mild dyspnea and orthopnea without paroxysmal nocturnal dyspnea.  Upon presentation to the emergency room, heart rate was 110 respiratory 22, temperature 99.1 with a blood pressure 136/59 and pulse oximetry of 99 percent on room air.  Labs revealed elevated BUN/creatinine of 28/1.5 compared to 21/1.36 on 01/06/2019.  High-sensitivity troponin I was 29.  Total bili was 1.5 and alk phos was 231 with albumin of 3.1 and total protein of 8.7.  INR is 1.4 with a PT of 17 and PTT 38.  Lactic acid was 1.8.  Urinalysis revealed 30 protein and was otherwise unremarkable. EKG showed atrial fibrillation with ventricular response of 100, right bundle branch block and PVCs.  Portable chest x-ray showed findings suggestive of at least mild pulmonary edema with cardiomegaly similar to slightly increased from prior chest x-ray in 2014.  The patient was given lactulose 30 g as well as IV vancomycin and IV Levaquin.  He would be admitted to a medically monitored bed for further evaluation and and management. PAST  MEDICAL HISTORY:   Past Medical History:  Diagnosis Date  . AF (atrial fibrillation) ([place])   . Cardiomyopathy ([place])   . CHF (congestive heart failure) ([place])   . Diabetes mellitus without complication ([place])   . Fatty liver   . H/O ETOH abuse   . Hx of adenomatous polyp of colon   . Hx of thrombocytopenia   . Hypertension   . Serum lipids high   . Thyroid disease     PAST SURGICAL HISTORY:   Past Surgical History:  Procedure Laterality Date  . CARDIAC CATHETERIZATION     no stents  . COLONOSCOPY    . COLONOSCOPY WITH PROPOFOL N/A 07/26/2015   Procedure: COLONOSCOPY WITH PROPOFOL;  Surgeon: Manya Silvas, MD;  Location: [facility]COPY;  Service: Endoscopy;  Laterality: N/A;  . HAMMER TOE SURGERY      SOCIAL HISTORY:   Social History   Tobacco Use  . Smoking status: Former Research scientist (life sciences)  . Smokeless tobacco: Current User    Types: Snuff  Substance Use Topics  . Alcohol use: No    FAMILY HISTORY:   Family History  Problem Relation Age of Onset  . Heart Problems Mother   . Heart Problems Father   . Heart Problems Sister     DRUG ALLERGIES:   Allergies  Allergen Reactions  . Penicillins Hives    REVIEW OF SYSTEMS:   ROS As per history of present illness. All pertinent systems were reviewed above. Constitutional,  HEENT, cardiovascular, respiratory, GI, GU, musculoskeletal,  neuro, psychiatric, endocrine,  integumentary and hematologic systems were reviewed and are otherwise  negative/unremarkable except for positive findings mentioned above in the HPI.   MEDICATIONS AT HOME:   Prior to Admission medications   Medication Sig Start Date End Date Taking? Authorizing Provider  allopurinol (ZYLOPRIM) 300 MG tablet Take 300 mg by mouth daily.    [provider]  apixaban (ELIQUIS) 5 MG TABS tablet Take by mouth 2 (two) times daily.  02/18/19   [provider]  atorvastatin (LIPITOR) 10 MG tablet Take 10 mg by mouth daily.    [provider]  clobetasol cream (TEMOVATE) 8.33 % Apply 1 application topically 2 (two) times daily.    [provider]  colchicine 0.6 MG tablet Take 0.3 mg by mouth daily.  02/18/19   [provider]  furosemide (LASIX) 40 MG tablet Take 1 tablet (40 mg total) by mouth 2 (two) times daily. Take one tablet (40 mg) once daily with another pill at 2 PM for extra swelling three days a week. 03/28/19 05/16/19  Minna Merritts, MD  levothyroxine (SYNTHROID, LEVOTHROID) 100 MCG tablet Take 100 mcg by mouth every morning. 10/29/17   [provider]  magnesium oxide (MAG-OX) 400 MG tablet Take 2 tablets by mouth 2 (two) times daily.  02/18/19 02/18/20  [provider]  metoprolol succinate (TOPROL-XL) 25 MG 24 hr tablet 25 mg daily.  02/10/16   [provider]  pantoprazole (PROTONIX) 20 MG tablet Take 20 mg by mouth daily.  02/02/19   [provider]  sacubitril-valsartan (ENTRESTO) 24-26 MG Take 1 tablet by mouth 2 (two) times daily.  02/18/19   [provider]      VITAL SIGNS:  Blood pressure 116/64, pulse 97, temperature 98.6 F (37 C), temperature source Oral, resp. rate 20, height 6' 2"  (1.88 m), weight 93.9 kg, SpO2 97 %.  PHYSICAL EXAMINATION:  Physical Exam  GENERAL:  69 y.o.-year-old patient lying in the bed with no acute distress.  EYES: Pupils equal, round, reactive to light and accommodation. No scleral icterus. Extraocular muscles intact.  HEENT: Head atraumatic, normocephalic. Oropharynx and nasopharynx clear.  NECK:  Supple, no jugular venous distention. No thyroid enlargement, no tenderness.  LUNGS: Slight diminished bibasal breath sounds with minimal bibasal rales. CARDIOVASCULAR: Regular rate and rhythm, S1, S2 normal. No murmurs, rubs, or gallops.  ABDOMEN: Soft, distended, nontender with positive shifting dullness. Bowel sounds present. No organomegaly or mass.  EXTREMITIES: Bilateral lower extremity pitting edema more on the left  than the right, with no cyanosis, or clubbing.  NEUROLOGIC: Cranial nerves II through XII are intact. Muscle strength 5/5 in all extremities. Sensation intact. Gait not checked.  PSYCHIATRIC: The patient is alert and oriented x 3.  Normal affect and good eye contact. SKIN: Left leg swelling with erythema without significant tenderness. LABORATORY PANEL:   CBC Recent Labs  Lab 04/04/19 2222  WBC 7.1  HGB 9.2*  HCT 29.5*  PLT 108*   ------------------------------------------------------------------------------------------------------------------  Chemistries  Recent Labs  Lab 04/04/19 2222  NA 139  K 4.1  CL 104  CO2 26  GLUCOSE 148*  BUN 28*  CREATININE 1.51*  CALCIUM 8.9  AST 34  ALT 22  ALKPHOS 234*  BILITOT 1.5*   ------------------------------------------------------------------------------------------------------------------  Cardiac Enzymes No results for input(s): TROPONINI in the last 168 hours. ------------------------------------------------------------------------------------------------------------------  RADIOLOGY:  US Venous Img Lower Unilateral Left  Result Date: 04/05/2019 CLINICAL DATA:  Redness and warmth of the left leg, elevated  serum creatinine and odontoid DA here trending figure out of with scarred on this EXAM: LEFT LOWER EXTREMITY VENOUS DOPPLER ULTRASOUND TECHNIQUE: Gray-scale sonography with compression, as well as color and duplex ultrasound, were performed to evaluate the deep venous system(s) from the level of the common femoral vein through the popliteal and proximal calf veins. COMPARISON:  None. FINDINGS: VENOUS Normal compressibility of the common femoral, superficial femoral, and popliteal veins, as well as the visualized calf veins. Visualized portions of profunda femoral vein and great saphenous vein unremarkable. No filling defects to suggest DVT on grayscale or color Doppler imaging. Doppler waveforms show normal direction of venous  flow, normal respiratory phasicity and response to augmentation. Limited views of the contralateral common femoral vein are unremarkable. OTHER There are multiple ovoid hypoechoic lesions in the subdermal fat in the region of indicated biopsy approximately 4 months prior. These do not conform to the typical appearance of lymph nodes given and almost anechoic center with a peripheral rim of intermediate attenuation. These nodules are avascular and do not appear to reflect a superficial vein. While there is surrounding edema more diffuse soft tissue edema is noted elsewhere in the length as well. Limitations: none IMPRESSION: No femoropopliteal DVT nor evidence of DVT within the visualized calf veins. If clinical symptoms are inconsistent or if there are persistent or worsening symptoms, further imaging (possibly involving the iliac veins) may be warranted. Several small hypoechoic nodules within the subdermal fat in a region of patient's indicated area of erythema and near the site of recent biopsy. Overall appearance of these nodules is nonspecific. Finding could reflect nodes benign skin lesion such as dermal inclusion cysts or less likely a pilomatrixoma. However more insidious lesions are not fully excluded. Recommend first correlating with patient's biopsy results of this localized area. Could then consider short-term interval follow-up ultrasound to assess for change versus contrast-enhanced MRI on an outpatient basis. Electronically Signed   By: Lovena Le M.D.   On: 04/05/2019 00:25   DG Chest Portable 1 View  Result Date: 04/04/2019 CLINICAL DATA:  Dizziness, reported fever, no accompany symptoms, life vest battery is low EXAM: PORTABLE CHEST 1 VIEW COMPARISON:  Radiograph 12/23/2012 FINDINGS: Diffusely increased hazy interstitial opacities with cephalized, indistinct vascularity and some fissural thickening suggesting at least mild pulmonary edema with cardiomegaly similar to slightly increased from  prior though possibly accentuated by the portable technique. The aorta is calcified. The remaining cardiomediastinal contours are unremarkable. No visible pneumothorax or effusion although portions of the left costophrenic sulcus are collimated from view. No acute osseous or soft tissue abnormality. Degenerative changes are present in the imaged spine and shoulders. IMPRESSION: Findings suggestive of at least mild pulmonary edema with cardiomegaly similar to slightly increased from prior. Electronically Signed   By: Lovena Le M.D.   On: 04/04/2019 22:43      IMPRESSION AND PLAN:   1.  Hepatic encephalopathy with history of liver cell failure. -The patient will be admitted to medical monitored bed. -We will continue him on p.o. lactulose 30 g 3 times daily. -We will follow daily ammonia level. -We will monitor neuro checks every 4 hours.  2.  Left lower extremity moderate nonpurulent  cellulitis. -The patient will be placed on IV Rocephin. -Warm compresses will be applied.  3.  Mild acute kidney injury on top of stage IIIa chronic kidney disease, likely secondary to mild acute on chronic systolic CHF with EF of 30 to 40% on a recent cardiac MRI on 04/03/2019 and  outpatient echo showing EF of 20 to 25% in January of this year by [facility] -The patient will diuresis with IV Lasix and Toprol-XL while holding off Entresto -We will follow BMP.  4.  Gout. -We will continue allopurinol and as needed colchicine.  5.  Chronic atrial fibrillation, on Eliquis. -We will continue Toprol-XL and Eliquis.  6.  Hypothyroidism. -We will continue Synthroid and check TSH.  7.  DVT prophylaxis. -We will continue Eliquis.  8.  GI prophylaxis. -We will continue PPI therapy given current anticoagulation.  All the records are reviewed and case discussed with ED provider. The plan of care was discussed in details with the patient (and family). I answered all questions. The patient agreed to proceed with the  above mentioned plan. Further management will depend upon hospital course.   CODE STATUS: Full code  TOTAL TIME TAKING CARE OF THIS PATIENT: 55 minutes.    Christel Mormon M.D on 04/05/2019 at 1:20 AM  [facility]   From 7 PM-7 AM, contact night-coverage [email or web address]  CC: Primary care physician; Albina Billet, MD   Note: This dictation was prepared with Dragon dictation along with smaller phrase technology. Any transcriptional errors that result from this process are unintentional.

## 2019-04-05 NOTE — Progress Notes (Signed)
CODE SEPSIS - PHARMACY COMMUNICATION  **Broad Spectrum Antibiotics should be administered within 1 hour of Sepsis diagnosis**  Time Code Sepsis Called/Page Received: 2251  Antibiotics Ordered: Levaquin, Vancomycin  Time of 1st antibiotic administration: 2354, Levaquin  Additional action taken by pharmacy: n/a  If necessary, Name of Provider/Nurse Contacted: n/a    Ena Dawley ,PharmD Clinical Pharmacist  04/05/2019  12:05 AM

## 2019-04-05 NOTE — ED Notes (Signed)
ED TO INPATIENT HANDOFF REPORT  ED Nurse Name and Phone #: Bascom Levels Name/Age/Gender [name] 69 y.o. male Room/Bed: ED14A/ED14A  Code Status   Code Status: Full Code  Home/SNF/Other Home Patient oriented to: self, place, time and situation Is this baseline? Yes   Triage Complete: Triage complete  Chief Complaint Hepatic encephalopathy ([place]) [K72.90]  Triage Note Pt to triage via w/c with no distress noted, mask in place; life vest in place; Pt reports fever today with no accomp symptoms; denies c/o    Allergies Allergies  Allergen Reactions  . Penicillins Hives    Level of Care/Admitting Diagnosis ED Disposition    ED Disposition Condition [facility] Area: [place] [[id number]]  Level of Care: Med-Surg [16]  Covid Evaluation: Asymptomatic Screening Protocol (No Symptoms)  Diagnosis: Hepatic encephalopathy ([place]) [572.2.ICD-9-CM]  Admitting Physician: Christel Mormon [[id number]]  Attending Physician: Christel Mormon [[id number]]  Estimated length of stay: past midnight tomorrow  Certification:: I certify this patient will need inpatient services for at least 2 midnights       B Medical/Surgery History Past Medical History:  Diagnosis Date  . AF (atrial fibrillation) ([place])   . Cardiomyopathy ([place])   . CHF (congestive heart failure) ([place])   . Diabetes mellitus without complication ([place])   . Fatty liver   . H/O ETOH abuse   . Hx of adenomatous polyp of colon   . Hx of thrombocytopenia   . Hypertension   . Serum lipids high   . Thyroid disease    Past Surgical History:  Procedure Laterality Date  . CARDIAC CATHETERIZATION     no stents  . COLONOSCOPY    . COLONOSCOPY WITH PROPOFOL N/A 07/26/2015   Procedure: COLONOSCOPY WITH PROPOFOL;  Surgeon: Manya Silvas, MD;  Location: [facility] ENDOSCOPY;  Service: Endoscopy;  Laterality: N/A;  . HAMMER TOE SURGERY       A IV Location/Drains/Wounds Patient Lines/Drains/Airways Status    Active Line/Drains/Airways    Name:   Placement date:   Placement time:   Site:   Days:   Peripheral IV 04/04/19 Right Forearm   04/04/19    2242    Forearm   1   Peripheral IV 04/05/19 Left Hand   04/05/19    0004    Hand   less than 1          Intake/Output Last 24 hours No intake or output data in the 24 hours ending 04/05/19 0620  Labs/Imaging Results for orders placed or performed during the hospital encounter of 04/04/19 (from the past 48 hour(s))  CBC     Status: Abnormal   Collection Time: 04/04/19 10:22 PM  Result Value Ref Range   WBC 7.1 4.0 - 10.5 K/uL   RBC 2.62 (L) 4.22 - 5.81 MIL/uL   Hemoglobin 9.2 (L) 13.0 - 17.0 g/dL   HCT 29.5 (L) 39.0 - 52.0 %   MCV 112.6 (H) 80.0 - 100.0 fL   MCH 35.1 (H) 26.0 - 34.0 pg   MCHC 31.2 30.0 - 36.0 g/dL   RDW 15.9 (H) 11.5 - 15.5 %   Platelets 108 (L) 150 - 400 K/uL    Comment: Immature Platelet Fraction may be clinically indicated, consider ordering this additional test [id number]    nRBC 0.3 (H) 0.0 - 0.2 %    Comment: Performed at [facility], [address] [place], [place] [address]  Comprehensive metabolic panel  Status: Abnormal   Collection Time: 04/04/19 10:22 PM  Result Value Ref Range   Sodium 139 135 - 145 mmol/L   Potassium 4.1 3.5 - 5.1 mmol/L   Chloride 104 98 - 111 mmol/L   CO2 26 22 - 32 mmol/L   Glucose, Bld 148 (H) 70 - 99 mg/dL    Comment: Glucose reference range applies only to samples taken after fasting for at least 8 hours.   BUN 28 (H) 8 - 23 mg/dL   Creatinine, Ser 1.51 (H) 0.61 - 1.24 mg/dL   Calcium 8.9 8.9 - 10.3 mg/dL   Total Protein 8.7 (H) 6.5 - 8.1 g/dL   Albumin 3.1 (L) 3.5 - 5.0 g/dL   AST 34 15 - 41 U/L   ALT 22 0 - 44 U/L   Alkaline Phosphatase 234 (H) 38 - 126 U/L   Total Bilirubin 1.5 (H) 0.3 - 1.2 mg/dL   GFR calc non Af Amer 46 (L) >60 mL/min   GFR calc Af Amer 54 (L) >60 mL/min   Anion gap 9 5 - 15    Comment: Performed at [facility], [place], [place] [address]  Troponin I (High Sensitivity)     Status: Abnormal   Collection Time: 04/04/19 10:22 PM  Result Value Ref Range   Troponin I (High Sensitivity) 29 (H) <18 ng/L    Comment: (NOTE) Elevated high sensitivity troponin I (hsTnI) values and significant  changes across serial measurements may suggest ACS but many other  chronic and acute conditions are known to elevate hsTnI results.  Refer to the "Links" section for chest pain algorithms and additional  guidance. Performed at [facility], [address] [place], [place] [address]   Urinalysis, Complete w Microscopic     Status: Abnormal   Collection Time: 04/04/19 10:22 PM  Result Value Ref Range   Color, Urine AMBER (A) YELLOW    Comment: BIOCHEMICALS MAY BE AFFECTED BY COLOR   APPearance CLEAR (A) CLEAR   Specific Gravity, Urine 1.019 1.005 - 1.030   pH 6.0 5.0 - 8.0   Glucose, UA NEGATIVE NEGATIVE mg/dL   Hgb urine dipstick NEGATIVE NEGATIVE   Bilirubin Urine NEGATIVE NEGATIVE   Ketones, ur NEGATIVE NEGATIVE mg/dL   Protein, ur 30 (A) NEGATIVE mg/dL   Nitrite NEGATIVE NEGATIVE   Leukocytes,Ua NEGATIVE NEGATIVE   RBC / HPF 0-5 0 - 5 RBC/hpf   WBC, UA 0-5 0 - 5 WBC/hpf   Bacteria, UA RARE (A) NONE SEEN   Squamous Epithelial / LPF 0-5 0 - 5   Mucus PRESENT    Hyaline Casts, UA PRESENT     Comment: Performed at [facility], [address] [place], [place] [address]  APTT     Status: Abnormal   Collection Time: 04/04/19 10:22 PM  Result Value Ref Range   aPTT 38 (H) 24 - 36 seconds    Comment:        IF BASELINE aPTT IS ELEVATED, SUGGEST PATIENT RISK ASSESSMENT BE USED TO DETERMINE APPROPRIATE ANTICOAGULANT THERAPY. Performed at [facility], [address] [place], [place] [address]   Protime-INR     Status: Abnormal   Collection Time: 04/04/19 10:22 PM  Result Value Ref Range   Prothrombin Time 17.0 (H) 11.4 - 15.2 seconds   INR 1.4 (H) 0.8 - 1.2     Comment: (NOTE) INR goal varies based on device and disease states. Performed at [facility], [address] [place], [place] [address]   Lactic acid, plasma     Status: None   Collection Time: 04/04/19 10:23 PM  Result Value Ref Range   Lactic Acid, Venous 1.8 0.5 - 1.9 mmol/L    Comment: Performed at [facility], [address] [place], [place] [address]  Ammonia     Status: Abnormal   Collection Time: 04/04/19 11:25 PM  Result Value Ref Range   Ammonia 82 (H) 9 - 35 umol/L    Comment: Performed at [facility], [address], [place], [place] [address]  Respiratory Panel by RT PCR (Flu A&B, Covid) -     Status: None   Collection Time: 04/05/19 12:31 AM  Result Value Ref Range   SARS Coronavirus 2 by RT PCR NEGATIVE NEGATIVE    Comment: (NOTE) SARS-CoV-2 target nucleic acids are NOT DETECTED. The SARS-CoV-2 RNA is generally detectable in upper respiratoy specimens during the acute phase of infection. The lowest concentration of SARS-CoV-2 viral copies this assay can detect is 131 copies/mL. A negative result does not preclude SARS-Cov-2 infection and should not be used as the sole basis for treatment or other patient management decisions. A negative result may occur with  improper specimen collection/handling, submission of specimen other than nasopharyngeal swab, presence of viral mutation(s) within the areas targeted by this assay, and inadequate number of viral copies (<131 copies/mL). A negative result must be combined with clinical observations, patient history, and epidemiological information. The expected result is Negative. Fact Sheet for Patients:  PinkCheek.be Fact Sheet for Healthcare Providers:  GravelBags.it This test is not yet ap proved or cleared by the [place] FDA and  has been authorized for detection and/or diagnosis of SARS-CoV-2 by FDA under an Emergency  Use Authorization (EUA). This EUA will remain  in effect (meaning this test can be used) for the duration of the COVID-19 declaration under Section 564(b)(1) of the Act, 21 U.S.C. section 360bbb-3(b)(1), unless the authorization is terminated or revoked sooner.    Influenza A by PCR NEGATIVE NEGATIVE   Influenza B by PCR NEGATIVE NEGATIVE    Comment: (NOTE) The Xpert Xpress SARS-CoV-2/FLU/RSV assay is intended as an aid in  the diagnosis of influenza from Nasopharyngeal swab specimens and  should not be used as a sole basis for treatment. Nasal washings and  aspirates are unacceptable for Xpert Xpress SARS-CoV-2/FLU/RSV  testing. Fact Sheet for Patients: PinkCheek.be Fact Sheet for Healthcare Providers: GravelBags.it This test is not yet approved or cleared by the [place] FDA and  has been authorized for detection and/or diagnosis of SARS-CoV-2 by  FDA under an Emergency Use Authorization (EUA). This EUA will remain  in effect (meaning this test can be used) for the duration of the  Covid-19 declaration under Section 564(b)(1) of the Act, 21  U.S.C. section 360bbb-3(b)(1), unless the authorization is  terminated or revoked. Performed at [facility], [address] [place], [place] [address]   Lactic acid, plasma     Status: None   Collection Time: 04/05/19  1:17 AM  Result Value Ref Range   Lactic Acid, Venous 1.2 0.5 - 1.9 mmol/L    Comment: Performed at [facility], [place], [place] [address]  Troponin I (High Sensitivity)     Status: Abnormal   Collection Time: 04/05/19  1:17 AM  Result Value Ref Range   Troponin I (High Sensitivity) 40 (H) <18 ng/L    Comment: (NOTE) Elevated high sensitivity troponin I (  hsTnI) values and significant  changes across serial measurements may suggest ACS but many other  chronic and acute conditions are known to elevate hsTnI results.  Refer  to the "Links" section for chest pain algorithms and additional  guidance. Performed at [facility], [address] [place], [place] [address]   Comprehensive metabolic panel     Status: Abnormal   Collection Time: 04/05/19  4:23 AM  Result Value Ref Range   Sodium 139 135 - 145 mmol/L   Potassium 3.8 3.5 - 5.1 mmol/L   Chloride 106 98 - 111 mmol/L   CO2 28 22 - 32 mmol/L   Glucose, Bld 104 (H) 70 - 99 mg/dL    Comment: Glucose reference range applies only to samples taken after fasting for at least 8 hours.   BUN 26 (H) 8 - 23 mg/dL   Creatinine, Ser 1.34 (H) 0.61 - 1.24 mg/dL   Calcium 8.4 (L) 8.9 - 10.3 mg/dL   Total Protein 7.6 6.5 - 8.1 g/dL   Albumin 2.7 (L) 3.5 - 5.0 g/dL   AST 31 15 - 41 U/L   ALT 19 0 - 44 U/L   Alkaline Phosphatase 206 (H) 38 - 126 U/L   Total Bilirubin 1.7 (H) 0.3 - 1.2 mg/dL   GFR calc non Af Amer 54 (L) >60 mL/min   GFR calc Af Amer >60 >60 mL/min   Anion gap 5 5 - 15    Comment: Performed at [facility], [address] [place], [place] [address]  CBC     Status: Abnormal   Collection Time: 04/05/19  4:23 AM  Result Value Ref Range   WBC 5.6 4.0 - 10.5 K/uL   RBC 2.27 (L) 4.22 - 5.81 MIL/uL   Hemoglobin 7.9 (L) 13.0 - 17.0 g/dL   HCT 25.6 (L) 39.0 - 52.0 %   MCV 112.8 (H) 80.0 - 100.0 fL   MCH 34.8 (H) 26.0 - 34.0 pg   MCHC 30.9 30.0 - 36.0 g/dL   RDW 16.1 (H) 11.5 - 15.5 %   Platelets 89 (L) 150 - 400 K/uL    Comment: PLATELET COUNT CONFIRMED BY SMEAR Immature Platelet Fraction may be clinically indicated, consider ordering this additional test [id number]    nRBC 0.0 0.0 - 0.2 %    Comment: Performed at [facility], [address] [place], [place] [address]  Ammonia     Status: Abnormal   Collection Time: 04/05/19  4:23 AM  Result Value Ref Range   Ammonia 62 (H) 9 - 35 umol/L    Comment: Performed at [facility], [address] [place], [place] [address]  TSH     Status: None    Collection Time: 04/05/19  4:23 AM  Result Value Ref Range   TSH 2.342 0.350 - 4.500 uIU/mL    Comment: Performed by a 3rd Generation assay with a functional sensitivity of <=0.01 uIU/mL. Performed at [facility], [address] [place], [place] [address]    US Venous Img Lower Unilateral Left  Result Date: 04/05/2019 CLINICAL DATA:  Redness and warmth of the left leg, elevated serum creatinine and odontoid DA here trending figure out of with scarred on this EXAM: LEFT LOWER EXTREMITY VENOUS DOPPLER ULTRASOUND TECHNIQUE: Gray-scale sonography with compression, as well as color and duplex ultrasound, were performed to evaluate the deep venous system(s) from the level of the common femoral vein through the popliteal and proximal calf veins. COMPARISON:  None. FINDINGS:  VENOUS Normal compressibility of the common femoral, superficial femoral, and popliteal veins, as well as the visualized calf veins. Visualized portions of profunda femoral vein and great saphenous vein unremarkable. No filling defects to suggest DVT on grayscale or color Doppler imaging. Doppler waveforms show normal direction of venous flow, normal respiratory phasicity and response to augmentation. Limited views of the contralateral common femoral vein are unremarkable. OTHER There are multiple ovoid hypoechoic lesions in the subdermal fat in the region of indicated biopsy approximately 4 months prior. These do not conform to the typical appearance of lymph nodes given and almost anechoic center with a peripheral rim of intermediate attenuation. These nodules are avascular and do not appear to reflect a superficial vein. While there is surrounding edema more diffuse soft tissue edema is noted elsewhere in the length as well. Limitations: none IMPRESSION: No femoropopliteal DVT nor evidence of DVT within the visualized calf veins. If clinical symptoms are inconsistent or if there are persistent or worsening symptoms, further  imaging (possibly involving the iliac veins) may be warranted. Several small hypoechoic nodules within the subdermal fat in a region of patient's indicated area of erythema and near the site of recent biopsy. Overall appearance of these nodules is nonspecific. Finding could reflect nodes benign skin lesion such as dermal inclusion cysts or less likely a pilomatrixoma. However more insidious lesions are not fully excluded. Recommend first correlating with patient's biopsy results of this localized area. Could then consider short-term interval follow-up ultrasound to assess for change versus contrast-enhanced MRI on an outpatient basis. Electronically Signed   By: Lovena Le M.D.   On: 04/05/2019 00:25   DG Chest Portable 1 View  Result Date: 04/04/2019 CLINICAL DATA:  Dizziness, reported fever, no accompany symptoms, life vest battery is low EXAM: PORTABLE CHEST 1 VIEW COMPARISON:  Radiograph 12/23/2012 FINDINGS: Diffusely increased hazy interstitial opacities with cephalized, indistinct vascularity and some fissural thickening suggesting at least mild pulmonary edema with cardiomegaly similar to slightly increased from prior though possibly accentuated by the portable technique. The aorta is calcified. The remaining cardiomediastinal contours are unremarkable. No visible pneumothorax or effusion although portions of the left costophrenic sulcus are collimated from view. No acute osseous or soft tissue abnormality. Degenerative changes are present in the imaged spine and shoulders. IMPRESSION: Findings suggestive of at least mild pulmonary edema with cardiomegaly similar to slightly increased from prior. Electronically Signed   By: Lovena Le M.D.   On: 04/04/2019 22:43    Pending Labs Unresulted Labs (From admission, onward)    Start     Ordered   04/05/19 0500  Ammonia  Daily,   STAT     04/05/19 0120   04/05/19 0115  HIV Antibody (routine testing w rflx)  (HIV Antibody (Routine testing w reflex)  panel)  Once,   STAT     04/05/19 0118   04/04/19 2250  Blood Culture (routine x 2)  BLOOD CULTURE X 2,   STAT     04/04/19 2251   04/04/19 2250  Urine culture  ONCE - STAT,   STAT     04/04/19 2251          Vitals/Pain Today's Vitals   04/05/19 0400 04/05/19 0430 04/05/19 0500 04/05/19 0600  BP: 123/62 121/69 113/63 118/66  Pulse: 88 80 86 85  Resp: 19 (!) 23 19 19   Temp:      TempSrc:      SpO2: 96% 99% 97% 96%  Weight:  Height:      PainSc:        Isolation Precautions No active isolations  Medications Medications  allopurinol (ZYLOPRIM) tablet 300 mg (has no administration in time range)  colchicine tablet 0.3 mg (has no administration in time range)  atorvastatin (LIPITOR) tablet 10 mg (has no administration in time range)  metoprolol succinate (TOPROL-XL) 24 hr tablet 25 mg (has no administration in time range)  levothyroxine (SYNTHROID) tablet 100 mcg (has no administration in time range)  magnesium oxide (MAG-OX) tablet 800 mg (has no administration in time range)  pantoprazole (PROTONIX) EC tablet 20 mg (has no administration in time range)  apixaban (ELIQUIS) tablet 5 mg (has no administration in time range)  cefTRIAXone (ROCEPHIN) 1 g in sodium chloride 0.9 % 100 mL IVPB (has no administration in time range)  0.9 %  sodium chloride infusion ( Intravenous New Bag/Given 04/05/19 0227)  lactulose (CHRONULAC) 10 GM/15ML solution 30 g (has no administration in time range)  furosemide (LASIX) injection 40 mg (40 mg Intravenous Given 04/05/19 0533)  vancomycin (VANCOCIN) IVPB 1000 mg/200 mL premix (0 mg Intravenous Stopped 04/05/19 0115)  levofloxacin (LEVAQUIN) IVPB 750 mg (0 mg Intravenous Stopped 04/05/19 0128)  vancomycin (VANCOCIN) IVPB 1000 mg/200 mL premix (0 mg Intravenous Stopped 04/05/19 0216)  lactulose (CHRONULAC) 10 GM/15ML solution 30 g (30 g Oral Given 04/05/19 0115)    Mobility non-ambulatory Moderate fall risk   Focused Assessments Cardiac  Assessment Handoff:    Lab Results  Component Value Date   CKTOTAL 67 01/12/2013   CKMB 0.9 01/12/2013   TROPONINI 0.04 01/12/2013   No results found for: DDIMER Does the Patient currently have chest pain? No     R Recommendations: See Admitting Provider Note  Report given to:   Additional Notes: Life vest

## 2019-04-05 NOTE — Progress Notes (Signed)
PHARMACY -  BRIEF ANTIBIOTIC NOTE   Pharmacy has received consult(s) for Levaquin and Vancomycin from an ED provider.  The patient's profile has been reviewed for ht/wt/allergies/indication/available labs.    One time order(s) placed for Levaquin 750mg  and Vancomycin 2gm (1gm + 1gm)  Further antibiotics/pharmacy consults should be ordered by admitting physician if indicated.                       Thank you, Hart Robinsons A 04/05/2019  12:05 AM

## 2019-04-05 NOTE — ED Notes (Signed)
Heat pack applied to patient's L leg and this RN elevated the extremity

## 2019-04-05 NOTE — ED Notes (Signed)
Patient asleep, no complaints at present. Call bell within reach. NAD, vital signs stable. 

## 2019-04-05 NOTE — ED Notes (Signed)
Admitting provider at bedside.

## 2019-04-05 NOTE — Progress Notes (Addendum)
PROGRESS NOTE    [name]  XNT:[id number] DOB: January 05, 1951 DOA: 04/04/2019 PCP: Albina Billet, MD    Brief Narrative:  [name]  is a 69 y.o. male with a known history of atrial fibrillation, CHF, hypertension, type 2 diabetes mellitus and fatty liver, who presents to the emergency room with acute onset of altered mental status with altered mental status with confusion which has been intermittent over the last several weeks but got significantly worse since yesterday with associated fever with a T-max of 102.  Is been having mild lower extremity edema with more swelling on the left leg with erythema.  No chest pain or cough or wheezing or hemoptysis.[name]  He has been having mild dyspnea and orthopnea without paroxysmal nocturnal dyspnea.EKG showed atrial fibrillation with ventricular response of 100, right bundle branch block and PVCs.  Portable chest x-ray showed findings suggestive of at least mild pulmonary edema with cardiomegaly similar to slightly increased from prior chest x-ray in 2014. The patient was given lactulose 30 g as well as IV vancomycin and IV Levaquin.  He would be admitted to a medically monitored bed for further evaluation and and management.   Consultants:  none  Procedures:   Antimicrobials:   Iv rocephin and vanco    Subjective: Wife at bedside, reports pt's LE redness and swelling improvement. MS improved  Objective: Vitals:   04/05/19 0500 04/05/19 0600 04/05/19 0704 04/05/19 0735  BP: 113/63 118/66 122/72 114/69  Pulse: 86 85 89 93  Resp: 19 19 16 18   Temp:   98.2 F (36.8 C) 98.8 F (37.1 C)  TempSrc:   Oral Oral  SpO2: 97% 96% 98% 98%  Weight:      Height:        Intake/Output Summary (Last 24 hours) at 04/05/2019 1406 Last data filed at 04/05/2019 1404 Gross per 24 hour  Intake 794.51 ml  Output 700 ml  Net 94.51 ml   Filed Weights   04/04/19 2155  Weight: 93.9 kg    Examination:  General exam: Appears calm and comfortable    Respiratory system: Clear to auscultation. Respiratory effort normal. Cardiovascular system: S1 & S2 heard, RRR. No JVD, murmurs, rubs, gallops or clicks.  Gastrointestinal system: Abdomen is nondistended, soft and nontender.  Normal bowel sounds heard. Central nervous system: Alert and oriented x3. Grossly intact Extremities: LLE erythema  With swelling. RLE with mild swelling Skin: warm dry Psychiatry: Judgement and insight appear normal. Mood & affect appropriate.     Data Reviewed: I have personally reviewed following labs and imaging studies  CBC: Recent Labs  Lab 04/04/19 2222 04/05/19 0423  WBC 7.1 5.6  HGB 9.2* 7.9*  HCT 29.5* 25.6*  MCV 112.6* 112.8*  PLT 108* 89*   Basic Metabolic Panel: Recent Labs  Lab 04/04/19 2222 04/05/19 0423  NA 139 139  K 4.1 3.8  CL 104 106  CO2 26 28  GLUCOSE 148* 104*  BUN 28* 26*  CREATININE 1.51* 1.34*  CALCIUM 8.9 8.4*   GFR: Estimated Creatinine Clearance: 60.5 mL/min (A) (by C-G formula based on SCr of 1.34 mg/dL (H)). Liver Function Tests: Recent Labs  Lab 04/04/19 2222 04/05/19 0423  AST 34 31  ALT 22 19  ALKPHOS 234* 206*  BILITOT 1.5* 1.7*  PROT 8.7* 7.6  ALBUMIN 3.1* 2.7*   No results for input(s): LIPASE, AMYLASE in the last 168 hours. Recent Labs  Lab 04/04/19 2325 04/05/19 0423  AMMONIA 82* 62*   Coagulation Profile:  Recent Labs  Lab 04/04/19 2222  INR 1.4*   Cardiac Enzymes: No results for input(s): CKTOTAL, CKMB, CKMBINDEX, TROPONINI in the last 168 hours. BNP (last 3 results) No results for input(s): PROBNP in the last 8760 hours. HbA1C: No results for input(s): HGBA1C in the last 72 hours. CBG: No results for input(s): GLUCAP in the last 168 hours. Lipid Profile: No results for input(s): CHOL, HDL, LDLCALC, TRIG, CHOLHDL, LDLDIRECT in the last 72 hours. Thyroid Function Tests: Recent Labs    04/05/19 0423  TSH 2.342   Anemia Panel: No results for input(s): VITAMINB12, FOLATE,  FERRITIN, TIBC, IRON, RETICCTPCT in the last 72 hours. Sepsis Labs: Recent Labs  Lab 04/04/19 2223 04/05/19 0117  LATICACIDVEN 1.8 1.2    Recent Results (from the past 240 hour(s))  Blood Culture (routine x 2)     Status: None (Preliminary result)   Collection Time: 04/04/19 11:25 PM   Specimen: BLOOD  Result Value Ref Range Status   Specimen Description   Final    BLOOD BLOOD RIGHT FOREARM Performed at [facility] [address], [place], [place] [address]    Special Requests   Final    BOTTLES DRAWN AEROBIC AND ANAEROBIC Blood Culture adequate volume Performed at [facility] [address] [place], [place] [address]    Culture   Final    NO GROWTH < 12 HOURS Performed at [facility], [address] [place], [place] [address]    Report Status PENDING  Incomplete  Blood Culture (routine x 2)     Status: None (Preliminary result)   Collection Time: 04/04/19 11:25 PM   Specimen: BLOOD  Result Value Ref Range Status   Specimen Description   Final    BLOOD LEFT ANTECUBITAL Performed at [facility] [address], [place], [place] [address]    Special Requests   Final    BOTTLES DRAWN AEROBIC AND ANAEROBIC Blood Culture results may not be optimal due to an inadequate volume of blood received in culture bottles Performed at [facility] [address] [place], [place] [address]    Culture   Final    NO GROWTH < 12 HOURS Performed at [facility], [address] [place], [place] [address]    Report Status PENDING  Incomplete  Respiratory Panel by RT PCR (Flu A&B, Covid) -     Status: None   Collection Time: 04/05/19 12:31 AM  Result Value Ref Range Status   SARS Coronavirus 2 by RT PCR NEGATIVE NEGATIVE Final    Comment: (NOTE) SARS-CoV-2 target nucleic acids are NOT DETECTED. The SARS-CoV-2 RNA is generally detectable in upper respiratoy specimens during the acute  phase of infection. The lowest concentration of SARS-CoV-2 viral copies this assay can detect is 131 copies/mL. A negative result does not preclude SARS-Cov-2 infection and should not be used as the sole basis for treatment or other patient management decisions. A negative result may occur with  improper specimen collection/handling, submission of specimen other than nasopharyngeal swab, presence of viral mutation(s) within the areas targeted by this assay, and inadequate number of viral copies (<131 copies/mL). A negative result must be combined with clinical observations, patient history, and epidemiological information. The expected result is Negative. Fact Sheet for Patients:  PinkCheek.be Fact Sheet for Healthcare Providers:  GravelBags.it This test is not yet ap proved or cleared by the [place] FDA and  has been authorized for detection and/or diagnosis of  SARS-CoV-2 by FDA under an Emergency Use Authorization (EUA). This EUA will remain  in effect (meaning this test can be used) for the duration of the COVID-19 declaration under Section 564(b)(1) of the Act, 21 U.S.C. section 360bbb-3(b)(1), unless the authorization is terminated or revoked sooner.    Influenza A by PCR NEGATIVE NEGATIVE Final   Influenza B by PCR NEGATIVE NEGATIVE Final    Comment: (NOTE) The Xpert Xpress SARS-CoV-2/FLU/RSV assay is intended as an aid in  the diagnosis of influenza from Nasopharyngeal swab specimens and  should not be used as a sole basis for treatment. Nasal washings and  aspirates are unacceptable for Xpert Xpress SARS-CoV-2/FLU/RSV  testing. Fact Sheet for Patients: PinkCheek.be Fact Sheet for Healthcare Providers: GravelBags.it This test is not yet approved or cleared by the [place] FDA and  has been authorized for detection and/or diagnosis of SARS-CoV-2 by    FDA under an Emergency Use Authorization (EUA). This EUA will remain  in effect (meaning this test can be used) for the duration of the  Covid-19 declaration under Section 564(b)(1) of the Act, 21  U.S.C. section 360bbb-3(b)(1), unless the authorization is  terminated or revoked. Performed at [facility], [address], [place], [place] [address]          Radiology Studies: US Venous Img Lower Unilateral Left  Result Date: 04/05/2019 CLINICAL DATA:  Redness and warmth of the left leg, elevated serum creatinine and odontoid DA here trending figure out of with scarred on this EXAM: LEFT LOWER EXTREMITY VENOUS DOPPLER ULTRASOUND TECHNIQUE: Gray-scale sonography with compression, as well as color and duplex ultrasound, were performed to evaluate the deep venous system(s) from the level of the common femoral vein through the popliteal and proximal calf veins. COMPARISON:  None. FINDINGS: VENOUS Normal compressibility of the common femoral, superficial femoral, and popliteal veins, as well as the visualized calf veins. Visualized portions of profunda femoral vein and great saphenous vein unremarkable. No filling defects to suggest DVT on grayscale or color Doppler imaging. Doppler waveforms show normal direction of venous flow, normal respiratory phasicity and response to augmentation. Limited views of the contralateral common femoral vein are unremarkable. OTHER There are multiple ovoid hypoechoic lesions in the subdermal fat in the region of indicated biopsy approximately 4 months prior. These do not conform to the typical appearance of lymph nodes given and almost anechoic center with a peripheral rim of intermediate attenuation. These nodules are avascular and do not appear to reflect a superficial vein. While there is surrounding edema more diffuse soft tissue edema is noted elsewhere in the length as well. Limitations: none IMPRESSION: No femoropopliteal DVT nor evidence of DVT within  the visualized calf veins. If clinical symptoms are inconsistent or if there are persistent or worsening symptoms, further imaging (possibly involving the iliac veins) may be warranted. Several small hypoechoic nodules within the subdermal fat in a region of patient's indicated area of erythema and near the site of recent biopsy. Overall appearance of these nodules is nonspecific. Finding could reflect nodes benign skin lesion such as dermal inclusion cysts or less likely a pilomatrixoma. However more insidious lesions are not fully excluded. Recommend first correlating with patient's biopsy results of this localized area. Could then consider short-term interval follow-up ultrasound to assess for change versus contrast-enhanced MRI on an outpatient basis. Electronically Signed   By: Lovena Le M.D.   On: 04/05/2019 00:25   DG Chest Portable 1 View  Result Date: 04/04/2019 CLINICAL DATA:  Dizziness, reported fever, no accompany symptoms, life vest battery is low EXAM: PORTABLE CHEST 1 VIEW COMPARISON:  Radiograph 12/23/2012 FINDINGS: Diffusely increased hazy interstitial opacities with cephalized, indistinct vascularity and some fissural thickening suggesting at least mild pulmonary edema with cardiomegaly similar to slightly increased from prior though possibly accentuated by the portable technique. The aorta is calcified. The remaining cardiomediastinal contours are unremarkable. No visible pneumothorax or effusion although portions of the left costophrenic sulcus are collimated from view. No acute osseous or soft tissue abnormality. Degenerative changes are present in the imaged spine and shoulders. IMPRESSION: Findings suggestive of at least mild pulmonary edema with cardiomegaly similar to slightly increased from prior. Electronically Signed   By: Lovena Le M.D.   On: 04/04/2019 22:43        Scheduled Meds: . allopurinol  300 mg Oral Daily  . apixaban  5 mg Oral BID  . atorvastatin  10 mg Oral  Daily  . colchicine  0.3 mg Oral Daily  . furosemide  40 mg Intravenous Q12H  . lactulose  30 g Oral TID  . levothyroxine  100 mcg Oral q morning - 10a  . magnesium oxide  800 mg Oral BID  . metoprolol succinate  25 mg Oral Daily  . pantoprazole  20 mg Oral Daily   Continuous Infusions: . sodium chloride 75 mL/hr at 04/05/19 0831  . cefTRIAXone (ROCEPHIN)  IV 1 g (04/05/19 0569)    Assessment & Plan:   Active Problems:   Hepatic encephalopathy ([place])   1.  Hepatic encephalopathy with history of liver cell failure. -Mental status improving -We will continue him on p.o. lactulose 30 g 3 times daily. Ammonium level decreasing -We will monitor neuro checks every 4 hours.  2.  Left lower extremity moderate nonpurulent  cellulitis. -The patient will be placed on IV Rocephin. -Warm compresses will be applied.  3.  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 -The patient will diuresis with IV Lasix and Toprol-XL while holding off Entresto -d/c ivf  4.  Gout. -We will continue allopurinol and as needed colchicine.  5.  Chronic atrial fibrillation, on Eliquis. -We will continue Toprol-XL and Eliquis.  6.  Hypothyroidism. -We will continue Synthroid and check TSH.   DVT prophylaxis: Eliquis Code Status: Full Family Communication: None at bedside Disposition Plan: We will DC home when medically stable.  Currently admitted for hepatic encephalopathy, left lower extremity cellulitis requiring IV antibiotics, and volume overload requiring IV Lasix Barriers to discharge: Still needs IV antibiotics and IV Lasix for the above medical issues     LOS: 0 days   Time spent: 45 minutes with more than 50% COC    Nolberto Hanlon, MD [facility] Pager [phone]  [phone]  If 7PM-7AM, please contact night-coverage [email or web address] Password [facility] 04/05/2019, 2:06 PM

## 2019-04-06 DIAGNOSIS — L0390 Cellulitis, unspecified: Secondary | ICD-10-CM

## 2019-04-06 LAB — URINE CULTURE: Culture: NO GROWTH

## 2019-04-06 LAB — BASIC METABOLIC PANEL
Anion gap: 8 (ref 5–15)
BUN: 27 mg/dL — ABNORMAL HIGH (ref 8–23)
CO2: 26 mmol/L (ref 22–32)
Calcium: 8.9 mg/dL (ref 8.9–10.3)
Chloride: 106 mmol/L (ref 98–111)
Creatinine, Ser: 1.62 mg/dL — ABNORMAL HIGH (ref 0.61–1.24)
GFR calc Af Amer: 49 mL/min — ABNORMAL LOW (ref >60)
GFR calc non Af Amer: 43 mL/min — ABNORMAL LOW (ref >60)
Glucose, Bld: 90 mg/dL (ref 70–99)
Potassium: 3.8 mmol/L (ref 3.5–5.1)
Sodium: 140 mmol/L (ref 135–145)

## 2019-04-06 LAB — AMMONIA: Ammonia: 38 umol/L — ABNORMAL HIGH (ref 9–35)

## 2019-04-06 MED ORDER — CEFAZOLIN SODIUM-DEXTROSE 2-4 GM/100ML-% IV SOLN
2.0000 g | Freq: Three times a day (TID) | INTRAVENOUS | Status: DC
  Administered 2019-04-06 – 2019-04-07 (×3): 2 g via INTRAVENOUS
  Filled 2019-04-06 (×5): qty 100

## 2019-04-06 NOTE — Consult Note (Signed)
Pharmacy Antibiotic Note  [name] is a 69 y.o. male admitted on 04/04/2019 with cellulitis.    Pharmacy has been consulted for Cefazolin dosing.  Plan: Will start cefazolin 2g q8h   Height: 6\' 2"  (188 cm) Weight: 207 lb (93.9 kg) IBW/kg (Calculated) : 82.2  Temp (24hrs), Avg:98.4 F (36.9 C), Min:98.1 F (36.7 C), Max:98.8 F (37.1 C)  Recent Labs  Lab 04/04/19 2222 04/04/19 2223 04/05/19 0117 04/05/19 0423 04/06/19 0314  WBC 7.1  --   --  5.6  --   CREATININE 1.51*  --   --  1.34* 1.62*  LATICACIDVEN  --  1.8 1.2  --   --     Estimated Creatinine Clearance: 50 mL/min (A) (by C-G formula based on SCr of 1.62 mg/dL (H)).    Allergies  Allergen Reactions  . Penicillins Hives    Antimicrobials this admission: Levofloxacin 3/16 x 1 Vancomycin 3/17 x 1 Ceftriaxone 3/17 x 1 Cefazolin 3/18 >>  Dose adjustments this admission: None  Microbiology results: 3/16 BCx: NG x 1 day 3/16 UCx: NG final  3/17 MRSA PCR: +  Thank you for allowing pharmacy to be a part of this patient's care.  Lu Duffel, PharmD, BCPS Clinical Pharmacist 04/06/2019 12:06 PM

## 2019-04-06 NOTE — Plan of Care (Signed)
  Problem: Health Behavior/Discharge Planning: Goal: Ability to manage health-related needs will improve Outcome: Progressing   Problem: Clinical Measurements: Goal: Ability to maintain clinical measurements within normal limits will improve Outcome: Progressing Goal: Will remain free from infection Outcome: Progressing Goal: Diagnostic test results will improve Outcome: Progressing Goal: Respiratory complications will improve Outcome: Progressing Goal: Cardiovascular complication will be avoided Outcome: Progressing   Problem: Activity: Goal: Risk for activity intolerance will decrease Outcome: Progressing   Problem: Elimination: Goal: Will not experience complications related to bowel motility Outcome: Progressing   Problem: Pain Managment: Goal: General experience of comfort will improve Outcome: Progressing   Problem: Skin Integrity: Goal: Risk for impaired skin integrity will decrease Outcome: Progressing

## 2019-04-06 NOTE — Progress Notes (Signed)
PROGRESS NOTE    [name]  KGY:[id number] DOB: [birth year] DOA: 04/04/2019 PCP: Albina Billet, MD    Brief Narrative:  [name]  is a 69 y.o. male with a known history of atrial fibrillation, CHF, hypertension, type 2 diabetes mellitus and fatty liver, who presents to the emergency room with acute onset of altered mental status with altered mental status with confusion which has been intermittent over the last several weeks but got significantly worse since yesterday with associated fever with a T-max of 102.  Is been having mild lower extremity edema with more swelling on the left leg with erythema.  No chest pain or cough or wheezing or hemoptysis.[name]  He has been having mild dyspnea and orthopnea without paroxysmal nocturnal dyspnea.EKG showed atrial fibrillation with ventricular response of 100, right bundle branch block and PVCs.  Portable chest x-ray showed findings suggestive of at least mild pulmonary edema with cardiomegaly similar to slightly increased from prior chest x-ray in 2014. The patient was given lactulose 30 g as well as IV vancomycin and IV Levaquin.  He would be admitted to a medically monitored bed for further evaluation and and management.   Consultants:  none  Procedures:   Antimicrobials:   Iv rocephin and vanco    Subjective: Wife at bedside, reports pt's LE redness and swelling improvement. MS improved  Objective: Vitals:   04/05/19 0735 04/05/19 1559 04/05/19 2328 04/06/19 0748  BP: 114/69 117/67 125/66 129/70  Pulse: 93 93 95 98  Resp: 18 18 17    Temp: 98.8 F (37.1 C) 98.3 F (36.8 C) 98.8 F (37.1 C) 98.1 F (36.7 C)  TempSrc: Oral Oral Oral   SpO2: 98% 97% 96% 93%  Weight:      Height:        Intake/Output Summary (Last 24 hours) at 04/06/2019 1254 Last data filed at 04/06/2019 1038 Gross per 24 hour  Intake 1267.59 ml  Output 650 ml  Net 617.59 ml   Filed Weights   04/04/19 2155  Weight: 93.9 kg    Examination:  General  exam: Appears calm and comfortable  Respiratory system: Clear to auscultation. Respiratory effort normal. Cardiovascular system: S1 & S2 heard, RRR. No JVD, murmurs, rubs, gallops or clicks.  Gastrointestinal system: Abdomen is nondistended, soft and nontender.  Normal bowel sounds heard. Central nervous system: Alert and oriented x3. Grossly intact Extremities: LLE erythema  With swelling. RLE with mild swelling Skin: warm dry Psychiatry: Judgement and insight appear normal. Mood & affect appropriate.     Data Reviewed: I have personally reviewed following labs and imaging studies  CBC: Recent Labs  Lab 04/04/19 2222 04/05/19 0423  WBC 7.1 5.6  HGB 9.2* 7.9*  HCT 29.5* 25.6*  MCV 112.6* 112.8*  PLT 108* 89*   Basic Metabolic Panel: Recent Labs  Lab 04/04/19 2222 04/05/19 0423 04/06/19 0314  NA 139 139 140  K 4.1 3.8 3.8  CL 104 106 106  CO2 26 28 26   GLUCOSE 148* 104* 90  BUN 28* 26* 27*  CREATININE 1.51* 1.34* 1.62*  CALCIUM 8.9 8.4* 8.9   GFR: Estimated Creatinine Clearance: 50 mL/min (A) (by C-G formula based on SCr of 1.62 mg/dL (H)). Liver Function Tests: Recent Labs  Lab 04/04/19 2222 04/05/19 0423  AST 34 31  ALT 22 19  ALKPHOS 234* 206*  BILITOT 1.5* 1.7*  PROT 8.7* 7.6  ALBUMIN 3.1* 2.7*   No results for input(s): LIPASE, AMYLASE in the last 168 hours. Recent  Labs  Lab 04/04/19 2325 04/05/19 0423 04/06/19 0314  AMMONIA 82* 62* 38*   Coagulation Profile: Recent Labs  Lab 04/04/19 2222  INR 1.4*   Cardiac Enzymes: No results for input(s): CKTOTAL, CKMB, CKMBINDEX, TROPONINI in the last 168 hours. BNP (last 3 results) No results for input(s): PROBNP in the last 8760 hours. HbA1C: No results for input(s): HGBA1C in the last 72 hours. CBG: No results for input(s): GLUCAP in the last 168 hours. Lipid Profile: No results for input(s): CHOL, HDL, LDLCALC, TRIG, CHOLHDL, LDLDIRECT in the last 72 hours. Thyroid Function Tests: Recent Labs      04/05/19 0423  TSH 2.342   Anemia Panel: No results for input(s): VITAMINB12, FOLATE, FERRITIN, TIBC, IRON, RETICCTPCT in the last 72 hours. Sepsis Labs: Recent Labs  Lab 04/04/19 2223 04/05/19 0117  LATICACIDVEN 1.8 1.2    Recent Results (from the past 240 hour(s))  Urine culture     Status: None   Collection Time: 04/04/19 10:22 PM   Specimen: In/Out Cath Urine  Result Value Ref Range Status   Specimen Description   Final    IN/OUT CATH URINE Performed at [facility], [address], [place], [place] [address]    Special Requests   Final    NONE Performed at [facility], [address], [place], [place] [address]    Culture   Final    NO GROWTH Performed at [facility], [place] [address] [place], [place] [address]    Report Status 04/06/2019 FINAL  Final  Blood Culture (routine x 2)     Status: None (Preliminary result)   Collection Time: 04/04/19 11:25 PM   Specimen: BLOOD  Result Value Ref Range Status   Specimen Description   Final    BLOOD BLOOD RIGHT FOREARM Performed at [facility] [address] [place], [place] [address]    Special Requests   Final    BOTTLES DRAWN AEROBIC AND ANAEROBIC Blood Culture adequate volume Performed at [facility] [address], [place], [place] [address]    Culture   Final    NO GROWTH 1 DAY Performed at [facility], [address] [place], [place] [address]    Report Status PENDING  Incomplete  Blood Culture (routine x 2)     Status: None (Preliminary result)   Collection Time: 04/04/19 11:25 PM   Specimen: BLOOD  Result Value Ref Range Status   Specimen Description   Final    BLOOD LEFT ANTECUBITAL Performed at [facility] [address] [place], [place] [address]    Special Requests   Final    BOTTLES DRAWN AEROBIC AND ANAEROBIC Blood Culture results may not be optimal due to an inadequate volume of blood  received in culture bottles Performed at [facility] [address], [place],  [address]    Culture   Final    NO GROWTH 1 DAY Performed at [facility], [address] [place],  [address]    Report Status PENDING  Incomplete  Respiratory Panel by RT PCR (Flu A&B, Covid) -     Status: None   Collection Time: 04/05/19 12:31 AM  Result Value Ref Range Status   SARS Coronavirus 2 by RT PCR NEGATIVE NEGATIVE Final    Comment: (NOTE) SARS-CoV-2 target nucleic acids are NOT DETECTED. The SARS-CoV-2 RNA is generally detectable in upper respiratoy specimens during the acute phase of infection. The lowest  concentration of SARS-CoV-2 viral copies this assay can detect is 131 copies/mL. A negative result does not preclude SARS-Cov-2 infection and should not be used as the sole basis for treatment or other patient management decisions. A negative result may occur with  improper specimen collection/handling, submission of specimen other than nasopharyngeal swab, presence of viral mutation(s) within the areas targeted by this assay, and inadequate number of viral copies (<131 copies/mL). A negative result must be combined with clinical observations, patient history, and epidemiological information. The expected result is Negative. Fact Sheet for Patients:  PinkCheek.be Fact Sheet for Healthcare Providers:  GravelBags.it This test is not yet ap proved or cleared by the [place] FDA and  has been authorized for detection and/or diagnosis of SARS-CoV-2 by FDA under an Emergency Use Authorization (EUA). This EUA will remain  in effect (meaning this test can be used) for the duration of the COVID-19 declaration under Section 564(b)(1) of the Act, 21 U.S.C. section 360bbb-3(b)(1), unless the authorization is terminated or revoked sooner.    Influenza A by PCR NEGATIVE NEGATIVE Final    Influenza B by PCR NEGATIVE NEGATIVE Final    Comment: (NOTE) The Xpert Xpress SARS-CoV-2/FLU/RSV assay is intended as an aid in  the diagnosis of influenza from Nasopharyngeal swab specimens and  should not be used as a sole basis for treatment. Nasal washings and  aspirates are unacceptable for Xpert Xpress SARS-CoV-2/FLU/RSV  testing. Fact Sheet for Patients: PinkCheek.be Fact Sheet for Healthcare Providers: GravelBags.it This test is not yet approved or cleared by the [place] FDA and  has been authorized for detection and/or diagnosis of SARS-CoV-2 by  FDA under an Emergency Use Authorization (EUA). This EUA will remain  in effect (meaning this test can be used) for the duration of the  Covid-19 declaration under Section 564(b)(1) of the Act, 21  U.S.C. section 360bbb-3(b)(1), unless the authorization is  terminated or revoked. Performed at [facility], [address] [place], [place] [address]   MRSA PCR Screening     Status: Abnormal   Collection Time: 04/05/19  2:57 PM   Specimen: Nasal Mucosa; Nasopharyngeal  Result Value Ref Range Status   MRSA by PCR POSITIVE (A) NEGATIVE Final    Comment:        The GeneXpert MRSA Assay (FDA approved for NASAL specimens only), is one component of a comprehensive MRSA colonization surveillance program. It is not intended to diagnose MRSA infection nor to guide or monitor treatment for MRSA infections. RESULT CALLED TO, READ BACK BY AND VERIFIED WITH: Elizabeth Palau RN AT [phone] ON 04/05/19 SNG Performed at [facility], [address] [place], [place] [address]          Radiology Studies: US Venous Img Lower Unilateral Left  Result Date: 04/05/2019 CLINICAL DATA:  Redness and warmth of the left leg, elevated serum creatinine and odontoid DA here trending figure out of with scarred on this EXAM: LEFT LOWER EXTREMITY VENOUS DOPPLER ULTRASOUND  TECHNIQUE: Gray-scale sonography with compression, as well as color and duplex ultrasound, were performed to evaluate the deep venous system(s) from the level of the common femoral vein through the popliteal and proximal calf veins. COMPARISON:  None. FINDINGS: VENOUS Normal compressibility of the common femoral, superficial femoral, and popliteal veins, as well as the visualized calf veins. Visualized portions of profunda femoral vein and great saphenous vein unremarkable. No filling defects to suggest DVT on grayscale or color Doppler imaging. Doppler waveforms show normal direction of venous  flow, normal respiratory phasicity and response to augmentation. Limited views of the contralateral common femoral vein are unremarkable. OTHER There are multiple ovoid hypoechoic lesions in the subdermal fat in the region of indicated biopsy approximately 4 months prior. These do not conform to the typical appearance of lymph nodes given and almost anechoic center with a peripheral rim of intermediate attenuation. These nodules are avascular and do not appear to reflect a superficial vein. While there is surrounding edema more diffuse soft tissue edema is noted elsewhere in the length as well. Limitations: none IMPRESSION: No femoropopliteal DVT nor evidence of DVT within the visualized calf veins. If clinical symptoms are inconsistent or if there are persistent or worsening symptoms, further imaging (possibly involving the iliac veins) may be warranted. Several small hypoechoic nodules within the subdermal fat in a region of patient's indicated area of erythema and near the site of recent biopsy. Overall appearance of these nodules is nonspecific. Finding could reflect nodes benign skin lesion such as dermal inclusion cysts or less likely a pilomatrixoma. However more insidious lesions are not fully excluded. Recommend first correlating with patient's biopsy results of this localized area. Could then consider short-term  interval follow-up ultrasound to assess for change versus contrast-enhanced MRI on an outpatient basis. Electronically Signed   By: Lovena Le M.D.   On: 04/05/2019 00:25   DG Chest Portable 1 View  Result Date: 04/04/2019 CLINICAL DATA:  Dizziness, reported fever, no accompany symptoms, life vest battery is low EXAM: PORTABLE CHEST 1 VIEW COMPARISON:  Radiograph 12/23/2012 FINDINGS: Diffusely increased hazy interstitial opacities with cephalized, indistinct vascularity and some fissural thickening suggesting at least mild pulmonary edema with cardiomegaly similar to slightly increased from prior though possibly accentuated by the portable technique. The aorta is calcified. The remaining cardiomediastinal contours are unremarkable. No visible pneumothorax or effusion although portions of the left costophrenic sulcus are collimated from view. No acute osseous or soft tissue abnormality. Degenerative changes are present in the imaged spine and shoulders. IMPRESSION: Findings suggestive of at least mild pulmonary edema with cardiomegaly similar to slightly increased from prior. Electronically Signed   By: Lovena Le M.D.   On: 04/04/2019 22:43        Scheduled Meds: . allopurinol  300 mg Oral Daily  . apixaban  5 mg Oral BID  . atorvastatin  10 mg Oral Daily  . Chlorhexidine Gluconate Cloth  6 each Topical Q0600  . colchicine  0.3 mg Oral Daily  . lactulose  30 g Oral TID  . levothyroxine  100 mcg Oral q morning - 10a  . magnesium oxide  800 mg Oral BID  . metoprolol succinate  25 mg Oral Daily  . mupirocin ointment  1 application Nasal BID  . pantoprazole  20 mg Oral Daily   Continuous Infusions: .  ceFAZolin (ANCEF) IV      Assessment & Plan:   Active Problems:   Hepatic encephalopathy ([place])   1.  Hepatic encephalopathy with history of liver cell failure. -Mental status improving -We will continue him on p.o. lactulose 30 g 3 times daily. Ammonium level decreasing   2.   Left lower extremity moderate nonpurulent  cellulitis. -ID consulted. Ceftriaxone switched to cefazolin. Legs elevated. Once stable for discharge, ID recommends Keflex 500 mg 4 times daily for total of 10 days of antibiotics Will need to follow-up with oncology soon after discharge for the lower extremity lesions that could possibly be plasmacytomas -Follow-up with ID in 2 to 3 weeks  3.  acute kidney injury on top of stage IIIa chronic kidney disease, likely secondary to mild acute on chronic systolic CHF with EF of 30 to 40% on a recent cardiac MRI on 04/03/2019 and outpatient echo showing EF of 20 to 25% in January of this year by [facility]. -The patient was receiving diuresis with IV Lasix D/c iv lasix holding off Entresto Monitor   4.  Gout. -We will continue allopurinol and as needed colchicine.  5.  Chronic atrial fibrillation, on Eliquis. -We will continue Toprol-XL and Eliquis.  6.  Hypothyroidism. -We will continue Synthroid    DVT prophylaxis: Eliquis Code Status: Full Family Communication: None at bedside Disposition Plan: We will DC home when medically stable.  Currently admitted for hepatic encephalopathy, left lower extremity cellulitis requiring IV antibiotics, and volume overload requiring IV Lasix. Now with A/CKI. Barriers to discharge:needs iv abx. Needs renal function monitoring as it has increased. Not ready to be switched to po abx since not much LE cellulitis improvement   LOS: 1 day   Time spent: 45 minutes with more than 50% COC    Nolberto Hanlon, MD [facility] Pager [phone]  If 7PM-7AM, please contact night-coverage [email or web address] Password [facility] 04/06/2019, 12:54 PM Patient ID: [name], male   DOB: 09-12-1950, 69 y.o.   MRN: [id number]

## 2019-04-06 NOTE — Consult Note (Signed)
Infectious Disease     Reason for Consult LE cellulitis   Referring Physician: Dr Kurtis Bushman Date of Admission:  04/04/2019   Active Problems:   Hepatic encephalopathy ([place])   HPI: [name] is a 69 y.o. male with a hx of smoldering myeloma, atrial fibrillation, CHF, hypertension, type 2 diabetes mellitus and fatty liver, admitted 3/17 with acute onset of altered mental status with altered mental status intermittent over the last several weeks but got significantly worse  with associated fever with a T-max of 102. Also noted increasing LE edema and redness. On admit white blood count was 7.1 and no fevers.  He had borderline elevated troponins.  HIV was negative.  Lower extremity Doppler of the left was negative for DVT but did show some hypoechoic lesions.  He was started on initially levofloxacin and Vanco and then received a dose of ceftriaxone.  He has remained afebrile.  Past Medical History:  Diagnosis Date  . AF (atrial fibrillation) ([place])   . Cardiomyopathy ([name])   . CHF (congestive heart failure) ([place])   . Diabetes mellitus without complication ([place])   . Fatty liver   . H/O ETOH abuse   . Hx of adenomatous polyp of colon   . Hx of thrombocytopenia   . Hypertension   . Serum lipids high   . Thyroid disease    Past Surgical History:  Procedure Laterality Date  . CARDIAC CATHETERIZATION     no stents  . COLONOSCOPY    . COLONOSCOPY WITH PROPOFOL N/A 07/26/2015   Procedure: COLONOSCOPY WITH PROPOFOL;  Surgeon: Manya Silvas, MD;  Location: [facility];  Service: Endoscopy;  Laterality: N/A;  . HAMMER TOE SURGERY     Social History   Tobacco Use  . Smoking status: Former Research scientist (life sciences)  . Smokeless tobacco: Current User    Types: Snuff  Substance Use Topics  . Alcohol use: No  . Drug use: No   Family History  Problem Relation Age of Onset  . Heart Problems Mother   . Heart Problems Father   . Heart Problems Sister     Allergies:  Allergies  Allergen Reactions  .  Penicillins Hives    Current antibiotics: Antibiotics Given (last 72 hours)    Date/Time Action Medication Dose Rate   04/04/19 2354 New Bag/Given   levofloxacin (LEVAQUIN) IVPB 750 mg 750 mg 100 mL/hr   04/05/19 0009 New Bag/Given   vancomycin (VANCOCIN) IVPB 1000 mg/200 mL premix 1,000 mg 200 mL/hr   04/05/19 0114 New Bag/Given   vancomycin (VANCOCIN) IVPB 1000 mg/200 mL premix 1,000 mg 200 mL/hr   04/05/19 4034 New Bag/Given   cefTRIAXone (ROCEPHIN) 1 g in sodium chloride 0.9 % 100 mL IVPB 1 g 200 mL/hr      MEDICATIONS: . allopurinol  300 mg Oral Daily  . apixaban  5 mg Oral BID  . atorvastatin  10 mg Oral Daily  . Chlorhexidine Gluconate Cloth  6 each Topical Q0600  . colchicine  0.3 mg Oral Daily  . lactulose  30 g Oral TID  . levothyroxine  100 mcg Oral q morning - 10a  . magnesium oxide  800 mg Oral BID  . metoprolol succinate  25 mg Oral Daily  . mupirocin ointment  1 application Nasal BID  . pantoprazole  20 mg Oral Daily    Review of Systems - 11 systems reviewed and negative per HPI   OBJECTIVE: Temp:  [98.1 F (36.7 C)-98.8 F (37.1 C)] 98.1 F (36.7  C) (03/18 0748) Pulse Rate:  [93-98] 98 (03/18 0748) Resp:  [17-18] 17 (03/17 2328) BP: (117-129)/(66-70) 129/70 (03/18 0748) SpO2:  [93 %-97 %] 93 % (03/18 0748) Physical Exam  Constitutional: He is oriented to person, place, and time. He appears well-developed and well-nourished. No distress. obese HENT: anicteric Mouth/Throat: Oropharynx is clear and moist. No oropharyngeal exudate.  Cardiovascular: Normal rate, regular rhythm and normal heart sounds. Exam reveals no gallop and no friction rub.  No murmur heard.  Pulmonary/Chest: Effort normal and breath sounds normal. No respiratory distress. He has no wheezes.  Abdominal: Soft. Bowel sounds are normal. He exhibits no distension. There is no tenderness.  Lymphadenopathy: He has no cervical adenopathy.  Neurological: He is alert and oriented to person,  place, and time.  Ext 2+ RLE edema, trace LLE  Skin: erythema over L leg with edema. Healed prior skin bxp site. No open wounds or purulence Psychiatric: He has a normal mood and affect. His behavior is normal.     LABS: Results for orders placed or performed during the hospital encounter of 04/04/19 (from the past 48 hour(s))  CBC     Status: Abnormal   Collection Time: 04/04/19 10:22 PM  Result Value Ref Range   WBC 7.1 4.0 - 10.5 K/uL   RBC 2.62 (L) 4.22 - 5.81 MIL/uL   Hemoglobin 9.2 (L) 13.0 - 17.0 g/dL   HCT 29.5 (L) 39.0 - 52.0 %   MCV 112.6 (H) 80.0 - 100.0 fL   MCH 35.1 (H) 26.0 - 34.0 pg   MCHC 31.2 30.0 - 36.0 g/dL   RDW 15.9 (H) 11.5 - 15.5 %   Platelets 108 (L) 150 - 400 K/uL    Comment: Immature Platelet Fraction may be clinically indicated, consider ordering this additional test [id number]    nRBC 0.3 (H) 0.0 - 0.2 %    Comment: Performed at [facility], [address] [place], [place] [address]  Comprehensive metabolic panel     Status: Abnormal   Collection Time: 04/04/19 10:22 PM  Result Value Ref Range   Sodium 139 135 - 145 mmol/L   Potassium 4.1 3.5 - 5.1 mmol/L   Chloride 104 98 - 111 mmol/L   CO2 26 22 - 32 mmol/L   Glucose, Bld 148 (H) 70 - 99 mg/dL    Comment: Glucose reference range applies only to samples taken after fasting for at least 8 hours.   BUN 28 (H) 8 - 23 mg/dL   Creatinine, Ser 1.51 (H) 0.61 - 1.24 mg/dL   Calcium 8.9 8.9 - 10.3 mg/dL   Total Protein 8.7 (H) 6.5 - 8.1 g/dL   Albumin 3.1 (L) 3.5 - 5.0 g/dL   AST 34 15 - 41 U/L   ALT 22 0 - 44 U/L   Alkaline Phosphatase 234 (H) 38 - 126 U/L   Total Bilirubin 1.5 (H) 0.3 - 1.2 mg/dL   GFR calc non Af Amer 46 (L) >60 mL/min   GFR calc Af Amer 54 (L) >60 mL/min   Anion gap 9 5 - 15    Comment: Performed at [facility], [place], [place] [address]  Troponin I (High Sensitivity)     Status: Abnormal   Collection Time: 04/04/19 10:22 PM  Result  Value Ref Range   Troponin I (High Sensitivity) 29 (H) <18 ng/L    Comment: (NOTE) Elevated high sensitivity troponin I (hsTnI) values and significant  changes across serial measurements may suggest ACS  but many other  chronic and acute conditions are known to elevate hsTnI results.  Refer to the "Links" section for chest pain algorithms and additional  guidance. Performed at [facility], [address] [place], [place] [address]   Urinalysis, Complete w Microscopic     Status: Abnormal   Collection Time: 04/04/19 10:22 PM  Result Value Ref Range   Color, Urine AMBER (A) YELLOW    Comment: BIOCHEMICALS MAY BE AFFECTED BY COLOR   APPearance CLEAR (A) CLEAR   Specific Gravity, Urine 1.019 1.005 - 1.030   pH 6.0 5.0 - 8.0   Glucose, UA NEGATIVE NEGATIVE mg/dL   Hgb urine dipstick NEGATIVE NEGATIVE   Bilirubin Urine NEGATIVE NEGATIVE   Ketones, ur NEGATIVE NEGATIVE mg/dL   Protein, ur 30 (A) NEGATIVE mg/dL   Nitrite NEGATIVE NEGATIVE   Leukocytes,Ua NEGATIVE NEGATIVE   RBC / HPF 0-5 0 - 5 RBC/hpf   WBC, UA 0-5 0 - 5 WBC/hpf   Bacteria, UA RARE (A) NONE SEEN   Squamous Epithelial / LPF 0-5 0 - 5   Mucus PRESENT    Hyaline Casts, UA PRESENT     Comment: Performed at [facility], [address] [place], [place] [address]  APTT     Status: Abnormal   Collection Time: 04/04/19 10:22 PM  Result Value Ref Range   aPTT 38 (H) 24 - 36 seconds    Comment:        IF BASELINE aPTT IS ELEVATED, SUGGEST PATIENT RISK ASSESSMENT BE USED TO DETERMINE APPROPRIATE ANTICOAGULANT THERAPY. Performed at [facility], [address] [place], [place] [address]   Protime-INR     Status: Abnormal   Collection Time: 04/04/19 10:22 PM  Result Value Ref Range   Prothrombin Time 17.0 (H) 11.4 - 15.2 seconds   INR 1.4 (H) 0.8 - 1.2    Comment: (NOTE) INR goal varies based on device and disease states. Performed at [facility], [address],  [place], [place] [address]   Urine culture     Status: None   Collection Time: 04/04/19 10:22 PM   Specimen: In/Out Cath Urine  Result Value Ref Range   Specimen Description      IN/OUT CATH URINE Performed at [facility], [address] [place], [place] [address]    Special Requests      NONE Performed at [facility], [address] [place], [place] [address]    Culture      NO GROWTH Performed at [facility], [place] [address] [place], [place] [address]    Report Status 04/06/2019 FINAL   Lactic acid, plasma     Status: None   Collection Time: 04/04/19 10:23 PM  Result Value Ref Range   Lactic Acid, Venous 1.8 0.5 - 1.9 mmol/L    Comment: Performed at [facility], [address] [place], [place] [address]  Blood Culture (routine x 2)     Status: None (Preliminary result)   Collection Time: 04/04/19 11:25 PM   Specimen: BLOOD  Result Value Ref Range   Specimen Description      BLOOD BLOOD RIGHT FOREARM Performed at [place] [address], [place], [place] [address]    Special Requests      BOTTLES DRAWN AEROBIC AND ANAEROBIC Blood Culture adequate volume Performed at [place] [address] [place], [place] [address]    Culture      NO  GROWTH 1 DAY Performed at [facility], [address] [place], [place] [address]    Report Status PENDING   Blood Culture (routine x 2)     Status: None (Preliminary result)   Collection Time: 04/04/19 11:25 PM   Specimen: BLOOD  Result Value Ref Range   Specimen Description      BLOOD LEFT ANTECUBITAL Performed at [place] [address], [place], [place] [address]    Special Requests      BOTTLES DRAWN AEROBIC AND ANAEROBIC Blood Culture results may not be optimal due to an inadequate volume of blood received in culture bottles Performed at [facility], [place] [address], [place], [place]  [address]    Culture      NO GROWTH 1 DAY Performed at [facility], [address] [place], [place] [address]    Report Status PENDING   Ammonia     Status: Abnormal   Collection Time: 04/04/19 11:25 PM  Result Value Ref Range   Ammonia 82 (H) 9 - 35 umol/L    Comment: Performed at [facility], [address], [place], [place] [address]  Respiratory Panel by RT PCR (Flu A&B, Covid) -     Status: None   Collection Time: 04/05/19 12:31 AM  Result Value Ref Range   SARS Coronavirus 2 by RT PCR NEGATIVE NEGATIVE    Comment: (NOTE) SARS-CoV-2 target nucleic acids are NOT DETECTED. The SARS-CoV-2 RNA is generally detectable in upper respiratoy specimens during the acute phase of infection. The lowest concentration of SARS-CoV-2 viral copies this assay can detect is 131 copies/mL. A negative result does not preclude SARS-Cov-2 infection and should not be used as the sole basis for treatment or other patient management decisions. A negative result may occur with  improper specimen collection/handling, submission of specimen other than nasopharyngeal swab, presence of viral mutation(s) within the areas targeted by this assay, and inadequate number of viral copies (<131 copies/mL). A negative result must be combined with clinical observations, patient history, and epidemiological information. The expected result is Negative. Fact Sheet for Patients:  PinkCheek.be Fact Sheet for Healthcare Providers:  GravelBags.it This test is not yet ap proved or cleared by the [place] FDA and  has been authorized for detection and/or diagnosis of SARS-CoV-2 by FDA under an Emergency Use Authorization (EUA). This EUA will remain  in effect (meaning this test can be used) for the duration of the COVID-19 declaration under Section 564(b)(1) of the Act, 21 U.S.C. section 360bbb-3(b)(1), unless the authorization is terminated  or revoked sooner.    Influenza A by PCR NEGATIVE NEGATIVE   Influenza B by PCR NEGATIVE NEGATIVE    Comment: (NOTE) The Xpert Xpress SARS-CoV-2/FLU/RSV assay is intended as an aid in  the diagnosis of influenza from Nasopharyngeal swab specimens and  should not be used as a sole basis for treatment. Nasal washings and  aspirates are unacceptable for Xpert Xpress SARS-CoV-2/FLU/RSV  testing. Fact Sheet for Patients: PinkCheek.be Fact Sheet for Healthcare Providers: GravelBags.it This test is not yet approved or cleared by the [place] FDA and  has been authorized for detection and/or diagnosis of SARS-CoV-2 by  FDA under an Emergency Use Authorization (EUA). This EUA will remain  in effect (meaning this test can be used) for the duration of the  Covid-19 declaration under Section 564(b)(1) of the Act, 21  U.S.C. section 360bbb-3(b)(1), unless the authorization is  terminated or revoked. Performed at [facility], [place], [place] [address]   Lactic acid, plasma     Status: None   Collection Time: 04/05/19  1:17 AM  Result Value Ref Range   Lactic Acid, Venous 1.2 0.5 - 1.9 mmol/L    Comment: Performed at [facility], [place], [place] [address]  Troponin I (High Sensitivity)     Status: Abnormal   Collection Time: 04/05/19  1:17 AM  Result Value Ref Range   Troponin I (High Sensitivity) 40 (H) <18 ng/L    Comment: (NOTE) Elevated high sensitivity troponin I (hsTnI) values and significant  changes across serial measurements may suggest ACS but many other  chronic and acute conditions are known to elevate hsTnI results.  Refer to the "Links" section for chest pain algorithms and additional  guidance. Performed at [facility], [address] [place], [place] [address]   Comprehensive metabolic panel     Status: Abnormal   Collection Time: 04/05/19   4:23 AM  Result Value Ref Range   Sodium 139 135 - 145 mmol/L   Potassium 3.8 3.5 - 5.1 mmol/L   Chloride 106 98 - 111 mmol/L   CO2 28 22 - 32 mmol/L   Glucose, Bld 104 (H) 70 - 99 mg/dL    Comment: Glucose reference range applies only to samples taken after fasting for at least 8 hours.   BUN 26 (H) 8 - 23 mg/dL   Creatinine, Ser 1.34 (H) 0.61 - 1.24 mg/dL   Calcium 8.4 (L) 8.9 - 10.3 mg/dL   Total Protein 7.6 6.5 - 8.1 g/dL   Albumin 2.7 (L) 3.5 - 5.0 g/dL   AST 31 15 - 41 U/L   ALT 19 0 - 44 U/L   Alkaline Phosphatase 206 (H) 38 - 126 U/L   Total Bilirubin 1.7 (H) 0.3 - 1.2 mg/dL   GFR calc non Af Amer 54 (L) >60 mL/min   GFR calc Af Amer >60 >60 mL/min   Anion gap 5 5 - 15    Comment: Performed at [facility], [address] [place], [place] [address]  CBC     Status: Abnormal   Collection Time: 04/05/19  4:23 AM  Result Value Ref Range   WBC 5.6 4.0 - 10.5 K/uL   RBC 2.27 (L) 4.22 - 5.81 MIL/uL   Hemoglobin 7.9 (L) 13.0 - 17.0 g/dL   HCT 25.6 (L) 39.0 - 52.0 %   MCV 112.8 (H) 80.0 - 100.0 fL   MCH 34.8 (H) 26.0 - 34.0 pg   MCHC 30.9 30.0 - 36.0 g/dL   RDW 16.1 (H) 11.5 - 15.5 %   Platelets 89 (L) 150 - 400 K/uL    Comment: PLATELET COUNT CONFIRMED BY SMEAR Immature Platelet Fraction may be clinically indicated, consider ordering this additional test [id number]    nRBC 0.0 0.0 - 0.2 %    Comment: Performed at [facility], [address] [place], [place] [address]  Ammonia     Status: Abnormal   Collection Time: 04/05/19  4:23 AM  Result Value Ref Range   Ammonia 62 (H) 9 - 35 umol/L    Comment: Performed at [facility], [address] [place], [place] [address]  TSH     Status: None   Collection Time: 04/05/19  4:23 AM  Result Value Ref Range   TSH 2.342 0.350 - 4.500 uIU/mL    Comment: Performed by a 3rd Generation assay with a  functional sensitivity of <=0.01 uIU/mL. Performed at [facility], [address]  [place], [place] [address]   HIV Antibody (routine testing w rflx)     Status: None   Collection Time: 04/05/19  4:26 AM  Result Value Ref Range   HIV Screen 4th Generation wRfx NON REACTIVE NON REACTIVE    Comment: Performed at [place] [address] [place], [place] [address]  Troponin I (High Sensitivity)     Status: Abnormal   Collection Time: 04/05/19  2:22 PM  Result Value Ref Range   Troponin I (High Sensitivity) 48 (H) <18 ng/L    Comment: (NOTE) Elevated high sensitivity troponin I (hsTnI) values and significant  changes across serial measurements may suggest ACS but many other  chronic and acute conditions are known to elevate hsTnI results.  Refer to the "Links" section for chest pain algorithms and additional  guidance. Performed at [facility], [address] [place], [place] [address]   MRSA PCR Screening     Status: Abnormal   Collection Time: 04/05/19  2:57 PM   Specimen: Nasal Mucosa; Nasopharyngeal  Result Value Ref Range   MRSA by PCR POSITIVE (A) NEGATIVE    Comment:        The GeneXpert MRSA Assay (FDA approved for NASAL specimens only), is one component of a comprehensive MRSA colonization surveillance program. It is not intended to diagnose MRSA infection nor to guide or monitor treatment for MRSA infections. RESULT CALLED TO, READ BACK BY AND VERIFIED WITH: Elizabeth Palau RN AT [phone] ON 04/05/19 SNG Performed at [facility], [place], [place] [address]   Troponin I (High Sensitivity)     Status: Abnormal   Collection Time: 04/05/19  4:12 PM  Result Value Ref Range   Troponin I (High Sensitivity) 47 (H) <18 ng/L    Comment: (NOTE) Elevated high sensitivity troponin I (hsTnI) values and significant  changes across serial measurements may suggest ACS but many other  chronic and acute conditions are known to elevate hsTnI results.  Refer to the "Links" section for chest pain algorithms and additional   guidance. Performed at [facility], [address] [place], [place] [address]   Ammonia     Status: Abnormal   Collection Time: 04/06/19  3:14 AM  Result Value Ref Range   Ammonia 38 (H) 9 - 35 umol/L    Comment: Performed at [facility], [address] [place], [place] [address]  Basic metabolic panel     Status: Abnormal   Collection Time: 04/06/19  3:14 AM  Result Value Ref Range   Sodium 140 135 - 145 mmol/L   Potassium 3.8 3.5 - 5.1 mmol/L   Chloride 106 98 - 111 mmol/L   CO2 26 22 - 32 mmol/L   Glucose, Bld 90 70 - 99 mg/dL    Comment: Glucose reference range applies only to samples taken after fasting for at least 8 hours.   BUN 27 (H) 8 - 23 mg/dL   Creatinine, Ser 1.62 (H) 0.61 - 1.24 mg/dL   Calcium 8.9 8.9 - 10.3 mg/dL   GFR calc non Af Amer 43 (L) >60 mL/min   GFR calc Af Amer 49 (L) >60 mL/min   Anion gap 8 5 - 15    Comment: Performed at [facility], [address] [place], [place] [address]   No components found for: ESR, C REACTIVE PROTEIN MICRO: Recent Results (from the past  720 hour(s))  Urine culture     Status: None   Collection Time: 04/04/19 10:22 PM   Specimen: In/Out Cath Urine  Result Value Ref Range Status   Specimen Description   Final    IN/OUT CATH URINE Performed at [facility], [address] [place], [place] [address]    Special Requests   Final    NONE Performed at [facility], [address] [place], [place] [address]    Culture   Final    NO GROWTH Performed at [facility], [place] [address] [place], [place] [address]    Report Status 04/06/2019 FINAL  Final  Blood Culture (routine x 2)     Status: None (Preliminary result)   Collection Time: 04/04/19 11:25 PM   Specimen: BLOOD  Result Value Ref Range Status   Specimen Description   Final    BLOOD BLOOD RIGHT FOREARM Performed at [place] [address] [place], [place] [address]     Special Requests   Final    BOTTLES DRAWN AEROBIC AND ANAEROBIC Blood Culture adequate volume Performed at [place] [address] [place], [place] [address]    Culture   Final    NO GROWTH 1 DAY Performed at [facility], [address] [place], [place] [address]    Report Status PENDING  Incomplete  Blood Culture (routine x 2)     Status: None (Preliminary result)   Collection Time: 04/04/19 11:25 PM   Specimen: BLOOD  Result Value Ref Range Status   Specimen Description   Final    BLOOD LEFT ANTECUBITAL Performed at [place] [address] [place], [place] [address]    Special Requests   Final    BOTTLES DRAWN AEROBIC AND ANAEROBIC Blood Culture results may not be optimal due to an inadequate volume of blood received in culture bottles Performed at [facility] [address] [place], [place] [address]    Culture   Final    NO GROWTH 1 DAY Performed at [facility], [address] [place],  [address]    Report Status PENDING  Incomplete  Respiratory Panel by RT PCR (Flu A&B, Covid) -     Status: None   Collection Time: 04/05/19 12:31 AM  Result Value Ref Range Status   SARS Coronavirus 2 by RT PCR NEGATIVE NEGATIVE Final    Comment: (NOTE) SARS-CoV-2 target nucleic acids are NOT DETECTED. The SARS-CoV-2 RNA is generally detectable in upper respiratoy specimens during the acute phase of infection. The lowest concentration of SARS-CoV-2 viral copies this assay can detect is 131 copies/mL. A negative result does not preclude SARS-Cov-2 infection and should not be used as the sole basis for treatment or other patient management decisions. A negative result may occur with  improper specimen collection/handling, submission of specimen other than nasopharyngeal swab, presence of viral mutation(s) within the areas targeted by this assay, and inadequate number of viral copies (<131  copies/mL). A negative result must be combined with clinical observations, patient history, and epidemiological information. The expected result is Negative. Fact Sheet for Patients:  PinkCheek.be Fact Sheet for Healthcare Providers:  GravelBags.it This test is not yet ap proved or cleared by the [place] FDA and  has been authorized for detection and/or diagnosis of SARS-CoV-2 by FDA under an Emergency Use Authorization (EUA). This EUA will remain  in effect (meaning this test can be used) for the  duration of the COVID-19 declaration under Section 564(b)(1) of the Act, 21 U.S.C. section 360bbb-3(b)(1), unless the authorization is terminated or revoked sooner.    Influenza A by PCR NEGATIVE NEGATIVE Final   Influenza B by PCR NEGATIVE NEGATIVE Final    Comment: (NOTE) The Xpert Xpress SARS-CoV-2/FLU/RSV assay is intended as an aid in  the diagnosis of influenza from Nasopharyngeal swab specimens and  should not be used as a sole basis for treatment. Nasal washings and  aspirates are unacceptable for Xpert Xpress SARS-CoV-2/FLU/RSV  testing. Fact Sheet for Patients: PinkCheek.be Fact Sheet for Healthcare Providers: GravelBags.it This test is not yet approved or cleared by the [place] FDA and  has been authorized for detection and/or diagnosis of SARS-CoV-2 by  FDA under an Emergency Use Authorization (EUA). This EUA will remain  in effect (meaning this test can be used) for the duration of the  Covid-19 declaration under Section 564(b)(1) of the Act, 21  U.S.C. section 360bbb-3(b)(1), unless the authorization is  terminated or revoked. Performed at [facility], [address] [place], [place] [address]   MRSA PCR Screening     Status: Abnormal   Collection Time: 04/05/19  2:57 PM   Specimen: Nasal Mucosa; Nasopharyngeal  Result Value Ref Range  Status   MRSA by PCR POSITIVE (A) NEGATIVE Final    Comment:        The GeneXpert MRSA Assay (FDA approved for NASAL specimens only), is one component of a comprehensive MRSA colonization surveillance program. It is not intended to diagnose MRSA infection nor to guide or monitor treatment for MRSA infections. RESULT CALLED TO, READ BACK BY AND VERIFIED WITH: Elizabeth Palau RN AT [phone] ON 04/05/19 SNG Performed at [facility], [address] [place], [place] [address]     IMAGING: US Venous Img Lower Unilateral Left  Result Date: 04/05/2019 CLINICAL DATA:  Redness and warmth of the left leg, elevated serum creatinine and odontoid DA here trending figure out of with scarred on this EXAM: LEFT LOWER EXTREMITY VENOUS DOPPLER ULTRASOUND TECHNIQUE: Gray-scale sonography with compression, as well as color and duplex ultrasound, were performed to evaluate the deep venous system(s) from the level of the common femoral vein through the popliteal and proximal calf veins. COMPARISON:  None. FINDINGS: VENOUS Normal compressibility of the common femoral, superficial femoral, and popliteal veins, as well as the visualized calf veins. Visualized portions of profunda femoral vein and great saphenous vein unremarkable. No filling defects to suggest DVT on grayscale or color Doppler imaging. Doppler waveforms show normal direction of venous flow, normal respiratory phasicity and response to augmentation. Limited views of the contralateral common femoral vein are unremarkable. OTHER There are multiple ovoid hypoechoic lesions in the subdermal fat in the region of indicated biopsy approximately 4 months prior. These do not conform to the typical appearance of lymph nodes given and almost anechoic center with a peripheral rim of intermediate attenuation. These nodules are avascular and do not appear to reflect a superficial vein. While there is surrounding edema more diffuse soft tissue edema is noted  elsewhere in the length as well. Limitations: none IMPRESSION: No femoropopliteal DVT nor evidence of DVT within the visualized calf veins. If clinical symptoms are inconsistent or if there are persistent or worsening symptoms, further imaging (possibly involving the iliac veins) may be warranted. Several small hypoechoic nodules within the subdermal fat in a region of patient's indicated area of erythema and near the site of recent biopsy. Overall appearance of  these nodules is nonspecific. Finding could reflect nodes benign skin lesion such as dermal inclusion cysts or less likely a pilomatrixoma. However more insidious lesions are not fully excluded. Recommend first correlating with patient's biopsy results of this localized area. Could then consider short-term interval follow-up ultrasound to assess for change versus contrast-enhanced MRI on an outpatient basis. Electronically Signed   By: Lovena Le M.D.   On: 04/05/2019 00:25   DG Chest Portable 1 View  Result Date: 04/04/2019 CLINICAL DATA:  Dizziness, reported fever, no accompany symptoms, life vest battery is low EXAM: PORTABLE CHEST 1 VIEW COMPARISON:  Radiograph 12/23/2012 FINDINGS: Diffusely increased hazy interstitial opacities with cephalized, indistinct vascularity and some fissural thickening suggesting at least mild pulmonary edema with cardiomegaly similar to slightly increased from prior though possibly accentuated by the portable technique. The aorta is calcified. The remaining cardiomediastinal contours are unremarkable. No visible pneumothorax or effusion although portions of the left costophrenic sulcus are collimated from view. No acute osseous or soft tissue abnormality. Degenerative changes are present in the imaged spine and shoulders. IMPRESSION: Findings suggestive of at least mild pulmonary edema with cardiomegaly similar to slightly increased from prior. Electronically Signed   By: Lovena Le M.D.   On: 04/04/2019 22:43     Assessment:   [name] is a 69 y.o. 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  He was admitted with some confusion likely due to hepatic encephalopathy.  He was also found to have left lower extremity cellulitis.  There are no open wounds.  His MRSA PCR is positive but this does not really affect her lower extremity cellulitis as this is a nonpurulent cellulitis. I am concerned about the lesions seen on ultrasound of the left lower extremity.  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  I have messaged Dr. Grayland Ormond his oncologist about these. Recommendations Can change to cefazolin.  He is listed as penicillin allergic but is tolerating ceftriaxone. I advised the patient to elevate the leg on 5-6 pillows.  I prepped it up for him.  This should markedly help with the edema and redness. We will continue to treat and work on his hepatic encephalopathy and diuresis. Once stable for discharge I would suggest Keflex 500 mg 4 times a day for total of 10 days of antibiotics. He will need to follow-up with oncology soon after discharge for these lesions in his leg. I can see in follow-up in 2 to 3 weeks. Thank you very much for allowing me to participate in the care of this patient. Please call with questions.   Cheral Marker. Ola Spurr, MD

## 2019-04-07 LAB — BASIC METABOLIC PANEL
Anion gap: 5 (ref 5–15)
BUN: 29 mg/dL — ABNORMAL HIGH (ref 8–23)
CO2: 28 mmol/L (ref 22–32)
Calcium: 8.7 mg/dL — ABNORMAL LOW (ref 8.9–10.3)
Chloride: 105 mmol/L (ref 98–111)
Creatinine, Ser: 1.62 mg/dL — ABNORMAL HIGH (ref 0.61–1.24)
GFR calc Af Amer: 49 mL/min — ABNORMAL LOW (ref >60)
GFR calc non Af Amer: 43 mL/min — ABNORMAL LOW (ref >60)
Glucose, Bld: 95 mg/dL (ref 70–99)
Potassium: 3.4 mmol/L — ABNORMAL LOW (ref 3.5–5.1)
Sodium: 138 mmol/L (ref 135–145)

## 2019-04-07 LAB — AMMONIA: Ammonia: 29 umol/L (ref 9–35)

## 2019-04-07 MED ORDER — MUPIROCIN 2 % EX OINT: 1.0000 "application " | TOPICAL_OINTMENT | Freq: Two times a day (BID) | CUTANEOUS | 0 refills | Status: DC

## 2019-04-07 MED ORDER — POTASSIUM CHLORIDE CRYS ER 20 MEQ PO TBCR
40.0000 meq | EXTENDED_RELEASE_TABLET | Freq: Once | ORAL | Status: AC
  Administered 2019-04-07: 09:00:00 40 meq via ORAL
  Filled 2019-04-07: qty 2

## 2019-04-07 MED ORDER — CEPHALEXIN 500 MG PO CAPS: 500.0000 mg | ORAL_CAPSULE | Freq: Four times a day (QID) | ORAL | 0 refills | Status: AC

## 2019-04-07 MED ORDER — LACTULOSE 10 GM/15ML PO SOLN: 30.0000 g | Freq: Two times a day (BID) | ORAL | 0 refills | Status: AC

## 2019-04-07 MED ORDER — BACID PO TABS: 2.0000 | ORAL_TABLET | Freq: Three times a day (TID) | ORAL | 0 refills | Status: DC

## 2019-04-07 MED ORDER — BACID PO TABS
2.0000 | ORAL_TABLET | Freq: Three times a day (TID) | ORAL | Status: DC
  Filled 2019-04-07 (×2): qty 2

## 2019-04-07 MED ORDER — CEPHALEXIN 500 MG PO CAPS
500.0000 mg | ORAL_CAPSULE | Freq: Four times a day (QID) | ORAL | Status: DC
  Administered 2019-04-07: 15:00:00 500 mg via ORAL
  Filled 2019-04-07: qty 1

## 2019-04-07 MED ORDER — POTASSIUM CHLORIDE CRYS ER 20 MEQ PO TBCR: 40.0000 meq | EXTENDED_RELEASE_TABLET | Freq: Once | ORAL | Status: DC

## 2019-04-07 NOTE — TOC Transition Note (Signed)
Transition of Care [facility]) - CM/SW Discharge Note   Patient Details  Name: [name] MRN: [id number] Date of Birth: 1950-10-25  Transition of Care [facility]) CM/SW Contact:  Su Hilt, RN Phone Number: 04/07/2019, 2:36 PM   Clinical Narrative:     The patient will DC today with [facility] for [facility] services, he has DME at home and does not need additional, he has transportation and can afford his meds, he is up to date with PCP, no additional needs  Final next level of care: Home w Home Health Services Barriers to Discharge: Barriers Resolved   Patient Goals and CMS Choice Patient states their goals for this hospitalization and ongoing recovery are:: go home      Discharge Placement                       Discharge Plan and Services   Discharge Planning Services: CM Consult            DME Arranged: N/A         HH Arranged: NA          Social Determinants of Health (SDOH) Interventions     Readmission Risk Interventions No flowsheet data found.

## 2019-04-07 NOTE — Consult Note (Signed)
[facility] Nurse Consult Note:  Patient receiving care in [facility]. Reason for Consult: Unna boot to LLE Wound type: Per the note from Dr. Judye Bos, Infectious Diseases, there are no open wound, there is cellulitis, and concern for possible plasmacytomas. Pressure Injury POA: Yes/No/NA Measurement: Wound bed: Drainage (amount, consistency, odor)  Periwound: Dressing procedure/placement/frequency: I have communicated with Dr. Ola Spurr there is not a [facility] nurse in the building at this time.  However, I recommend Xeroform gauzes Kellie Simmering [phone]), kerlex, and a 4 inch ACE wrap to the LLE.  That way the patient can see his leg every day, can examine it, and know if any necrosis is developing.  I am uncertain if Dr. Ola Spurr wishes me to change the orders, or how to proceed.  "Ok thanks" was the SecureChat response to my contact and suggestion. Thank you for the consult.  Discussed consult with the primary RN, Korie.  [place] nurse will not follow at this time.  Please re-consult the [facility] team if needed.  Val Riles, RN, MSN, CWOCN, CNS-BC, pager [phone]

## 2019-04-07 NOTE — Discharge Summary (Signed)
[name] HWT:[id number] DOB: 09-Mar-1950 DOA: 04/04/2019  PCP: Albina Billet, MD  Admit date: 04/04/2019 Discharge date: 04/07/2019  Admitted From: Home Disposition: Home  Recommendations for Outpatient Follow-up:  1. Follow up with PCP in 1 week 2. Please obtain BMP/CBC in one week 3. Follow-up with Dr. Ola Spurr in 1 week 4. Follow-up with his oncologist as scheduled 5. Follow-up with cardiology next week-discussed with his wife who is at bedside, and she agrees with the plans and verbalizes an understanding  Home Health: Will need resumption of care/home health   Discharge Condition:Stable CODE STATUS: Full Diet recommendation: Heart Healthy Brief/Interim Summary: [name]  is a 69 y.o. male with a known history of atrial fibrillation, CHF, hypertension, type 2 diabetes mellitus and fatty liver, who presents to the emergency room with acute onset of altered mental status with altered mental status with confusion which has been intermittent over the last several weeks but got significantly worse.Upon presentation to the emergency room, heart rate was 110 respiratory 22, temperature 99.1 with a blood pressure 136/59 and pulse oximetry of 99 percent on room air.  Labs revealed elevated BUN/creatinine of 28/1.5 compared to 21/1.36 on 01/06/2019.  High-sensitivity troponin I was 29.  Total bili was 1.5 and alk phos was 231 with albumin of 3.1 and total protein of 8.7.  INR is 1.4 with a PT of 17 and PTT 38.  Lactic acid was 1.8.  Urinalysis revealed 30 protein and was otherwise unremarkable. EKG showed atrial fibrillation with ventricular response of 100, right bundle branch block and PVCs.  Portable chest x-ray showed findings suggestive of at least mild pulmonary edema with cardiomegaly similar to slightly increased from prior chest x-ray in 2014.  Patient was given lactulose, and IV antibiotics for his left lower extremity cellulitis.  Infectious disease was consulted.  MRSA was  positive positive.  His encephalopathy improved.  Due to his low blood pressure and acute on chronic kidney injury his Entresto and Lasix were held.  Infectious disease recommended discharging patient on Keflex to complete a 10-day course.  He was instructed to follow-up with his cardiologist since his Lasix and Entresto were held.    Discharge Diagnoses:  Active Problems:   Hepatic encephalopathy (HCC)   Cellulitis   Hypothyroidism   Acute renal failure superimposed on chronic kidney disease [facility])    Discharge Instructions  Discharge Instructions    Call MD for:  difficulty breathing, headache or visual disturbances   Complete by: As directed    Call MD for:  temperature >100.4   Complete by: As directed    Diet - low sodium heart healthy   Complete by: As directed    Discharge instructions   Complete by: As directed    Follow-up with infectious disease Dr. Ola Spurr Follow-up with PCP in 1 week Follow-up with your cardiologist to discuss your medications in 1 week   Increase activity slowly   Complete by: As directed      Allergies as of 04/07/2019      Reactions   Penicillins Hives      Medication List    STOP taking these medications   allopurinol 100 MG tablet Commonly known as: ZYLOPRIM   colchicine 0.6 MG tablet   furosemide 40 MG tablet Commonly known as: LASIX   sacubitril-valsartan 24-26 MG Commonly known as: ENTRESTO     TAKE these medications   apixaban 5 MG Tabs tablet Commonly known as: ELIQUIS Take by mouth 2 (two) times daily.  atorvastatin 10 MG tablet Commonly known as: LIPITOR Take 10 mg by mouth daily.   cephALEXin 500 MG capsule Commonly known as: KEFLEX Take 1 capsule (500 mg total) by mouth every 6 (six) hours for 9 days.   clobetasol cream 0.05 % Commonly known as: TEMOVATE Apply 1 application topically 2 (two) times daily.   gabapentin 100 MG capsule Commonly known as: NEURONTIN Take 100 mg by mouth 4 (four) times  daily.   lactobacillus acidophilus Tabs tablet Take 2 tablets by mouth 3 (three) times daily.   lactulose 10 GM/15ML solution Commonly known as: CHRONULAC Take 45 mLs (30 g total) by mouth 2 (two) times daily.   levothyroxine 100 MCG tablet Commonly known as: SYNTHROID Take 100 mcg by mouth every morning.   magnesium oxide 400 MG tablet Commonly known as: MAG-OX Take 2 tablets by mouth 2 (two) times daily.   metoprolol succinate 25 MG 24 hr tablet Commonly known as: TOPROL-XL 25 mg daily.   mupirocin ointment 2 % Commonly known as: BACTROBAN Place 1 application into the nose 2 (two) times daily.   pantoprazole 20 MG tablet Commonly known as: PROTONIX Take 20 mg by mouth daily.      Follow-up Information    Albina Billet, MD On 04/17/2019.   Specialty: Internal Medicine Why: @ 11:00 am Contact information: [address]   [place] [address] [phone]        Leonel Ramsay, MD In 2 weeks.   Specialty: Infectious Diseases Why: Per Office; They will call Patient Directly Contact information: [address] [place] [address] [phone]          Allergies  Allergen Reactions  . Penicillins Hives    Consultations:  ID   Procedures/Studies: US Venous Img Lower Unilateral Left  Result Date: 04/05/2019 CLINICAL DATA:  Redness and warmth of the left leg, elevated serum creatinine and odontoid DA here trending figure out of with scarred on this EXAM: LEFT LOWER EXTREMITY VENOUS DOPPLER ULTRASOUND TECHNIQUE: Gray-scale sonography with compression, as well as color and duplex ultrasound, were performed to evaluate the deep venous system(s) from the level of the common femoral vein through the popliteal and proximal calf veins. COMPARISON:  None. FINDINGS: VENOUS Normal compressibility of the common femoral, superficial femoral, and popliteal veins, as well as the visualized calf veins. Visualized portions of profunda femoral vein and  great saphenous vein unremarkable. No filling defects to suggest DVT on grayscale or color Doppler imaging. Doppler waveforms show normal direction of venous flow, normal respiratory phasicity and response to augmentation. Limited views of the contralateral common femoral vein are unremarkable. OTHER There are multiple ovoid hypoechoic lesions in the subdermal fat in the region of indicated biopsy approximately 4 months prior. These do not conform to the typical appearance of lymph nodes given and almost anechoic center with a peripheral rim of intermediate attenuation. These nodules are avascular and do not appear to reflect a superficial vein. While there is surrounding edema more diffuse soft tissue edema is noted elsewhere in the length as well. Limitations: none IMPRESSION: No femoropopliteal DVT nor evidence of DVT within the visualized calf veins. If clinical symptoms are inconsistent or if there are persistent or worsening symptoms, further imaging (possibly involving the iliac veins) may be warranted. Several small hypoechoic nodules within the subdermal fat in a region of patient's indicated area of erythema and near the site of recent biopsy. Overall appearance of these nodules is nonspecific. Finding could reflect  nodes benign skin lesion such as dermal inclusion cysts or less likely a pilomatrixoma. However more insidious lesions are not fully excluded. Recommend first correlating with patient's biopsy results of this localized area. Could then consider short-term interval follow-up ultrasound to assess for change versus contrast-enhanced MRI on an outpatient basis. Electronically Signed   By: Lovena Le M.D.   On: 04/05/2019 00:25   DG Chest Portable 1 View  Result Date: 04/04/2019 CLINICAL DATA:  Dizziness, reported fever, no accompany symptoms, life vest battery is low EXAM: PORTABLE CHEST 1 VIEW COMPARISON:  Radiograph 12/23/2012 FINDINGS: Diffusely increased hazy interstitial opacities with  cephalized, indistinct vascularity and some fissural thickening suggesting at least mild pulmonary edema with cardiomegaly similar to slightly increased from prior though possibly accentuated by the portable technique. The aorta is calcified. The remaining cardiomediastinal contours are unremarkable. No visible pneumothorax or effusion although portions of the left costophrenic sulcus are collimated from view. No acute osseous or soft tissue abnormality. Degenerative changes are present in the imaged spine and shoulders. IMPRESSION: Findings suggestive of at least mild pulmonary edema with cardiomegaly similar to slightly increased from prior. Electronically Signed   By: Lovena Le M.D.   On: 04/04/2019 22:43       Subjective: Feeling better.  Reports less lower extremity edema.  Wife at bedside.  Who is agreeable.  Discharge Exam: Vitals:   04/07/19 0752 04/07/19 1406  BP: (!) 116/56 102/73  Pulse: 89 84  Resp: 18 17  Temp: 98.5 F (36.9 C) 98.1 F (36.7 C)  SpO2: 98% 100%   Vitals:   04/06/19 0748 04/06/19 2329 04/07/19 0752 04/07/19 1406  BP: 129/70 119/75 (!) 116/56 102/73  Pulse: 98 64 89 84  Resp:  18 18 17   Temp: 98.1 F (36.7 C) 98.1 F (36.7 C) 98.5 F (36.9 C) 98.1 F (36.7 C)  TempSrc:   Oral Oral  SpO2: 93% 98% 98% 100%  Weight:      Height:        General: Pt is alert, awake x3, not in acute distress Cardiovascular: RRR, S1/S2 +, no rubs, no gallops Respiratory: CTA bilaterally, no wheezing, no rhonchi Abdominal: Soft, NT, ND, bowel sounds + Extremities: Positive bilateral lower extremity edema with chronic skin changes and chronic dependent erythema    The results of significant diagnostics from this hospitalization (including imaging, microbiology, ancillary and laboratory) are listed below for reference.     Microbiology: Recent Results (from the past 240 hour(s))  Urine culture     Status: None   Collection Time: 04/04/19 10:22 PM   Specimen:  In/Out Cath Urine  Result Value Ref Range Status   Specimen Description   Final    IN/OUT CATH URINE Performed at [facility], [address] [place], [place] [address]    Special Requests   Final    NONE Performed at [facility], [address], [place], [place] [address]    Culture   Final    NO GROWTH Performed at [facility], [place] [address], [place], [place] [address]    Report Status 04/06/2019 FINAL  Final  Blood Culture (routine x 2)     Status: None (Preliminary result)   Collection Time: 04/04/19 11:25 PM   Specimen: BLOOD  Result Value Ref Range Status   Specimen Description   Final    BLOOD BLOOD RIGHT FOREARM Performed at [facility] [address], [place], [place] [address]    Special  Requests   Final    BOTTLES DRAWN AEROBIC AND ANAEROBIC Blood Culture adequate volume Performed at [facility] [address], [place], [place] [address]    Culture   Final    NO GROWTH 2 DAYS Performed at [facility], [address] [place], [place] [address]    Report Status PENDING  Incomplete  Blood Culture (routine x 2)     Status: None (Preliminary result)   Collection Time: 04/04/19 11:25 PM   Specimen: BLOOD  Result Value Ref Range Status   Specimen Description   Final    BLOOD LEFT ANTECUBITAL Performed at [facility] [address] [place], [place] [address]    Special Requests   Final    BOTTLES DRAWN AEROBIC AND ANAEROBIC Blood Culture results may not be optimal due to an inadequate volume of blood received in culture bottles Performed at [facility] [address] [place], [place] [address]    Culture   Final    NO GROWTH 2 DAYS Performed at [facility], [address] [place], [place] [address]    Report Status PENDING  Incomplete  Respiratory Panel by RT PCR (Flu A&B, Covid) -     Status: None   Collection Time: 04/05/19  12:31 AM  Result Value Ref Range Status   SARS Coronavirus 2 by RT PCR NEGATIVE NEGATIVE Final    Comment: (NOTE) SARS-CoV-2 target nucleic acids are NOT DETECTED. The SARS-CoV-2 RNA is generally detectable in upper respiratoy specimens during the acute phase of infection. The lowest concentration of SARS-CoV-2 viral copies this assay can detect is 131 copies/mL. A negative result does not preclude SARS-Cov-2 infection and should not be used as the sole basis for treatment or other patient management decisions. A negative result may occur with  improper specimen collection/handling, submission of specimen other than nasopharyngeal swab, presence of viral mutation(s) within the areas targeted by this assay, and inadequate number of viral copies (<131 copies/mL). A negative result must be combined with clinical observations, patient history, and epidemiological information. The expected result is Negative. Fact Sheet for Patients:  PinkCheek.be Fact Sheet for Healthcare Providers:  GravelBags.it This test is not yet ap proved or cleared by the [place] FDA and  has been authorized for detection and/or diagnosis of SARS-CoV-2 by FDA under an Emergency Use Authorization (EUA). This EUA will remain  in effect (meaning this test can be used) for the duration of the COVID-19 declaration under Section 564(b)(1) of the Act, 21 U.S.C. section 360bbb-3(b)(1), unless the authorization is terminated or revoked sooner.    Influenza A by PCR NEGATIVE NEGATIVE Final   Influenza B by PCR NEGATIVE NEGATIVE Final    Comment: (NOTE) The Xpert Xpress SARS-CoV-2/FLU/RSV assay is intended as an aid in  the diagnosis of influenza from Nasopharyngeal swab specimens and  should not be used as a sole basis for treatment. Nasal washings and  aspirates are unacceptable for Xpert Xpress SARS-CoV-2/FLU/RSV  testing. Fact Sheet for  Patients: PinkCheek.be Fact Sheet for Healthcare Providers: GravelBags.it This test is not yet approved or cleared by the [place] FDA and  has been authorized for detection and/or diagnosis of SARS-CoV-2 by  FDA under an Emergency Use Authorization (EUA). This EUA will remain  in effect (meaning this test can be used) for the duration of the  Covid-19 declaration under Section 564(b)(1) of the Act, 21  U.S.C. section 360bbb-3(b)(1), unless the authorization is  terminated or  revoked. Performed at [facility], [address] [place], [place] [address]   MRSA PCR Screening     Status: Abnormal   Collection Time: 04/05/19  2:57 PM   Specimen: Nasal Mucosa; Nasopharyngeal  Result Value Ref Range Status   MRSA by PCR POSITIVE (A) NEGATIVE Final    Comment:        The GeneXpert MRSA Assay (FDA approved for NASAL specimens only), is one component of a comprehensive MRSA colonization surveillance program. It is not intended to diagnose MRSA infection nor to guide or monitor treatment for MRSA infections. RESULT CALLED TO, READ BACK BY AND VERIFIED WITH: Elizabeth Palau RN AT [phone] ON 04/05/19 SNG Performed at [facility], [address] [place], [place] [address]      Labs: BNP (last 3 results) No results for input(s): BNP in the last 8760 hours. Basic Metabolic Panel: Recent Labs  Lab 04/04/19 2222 04/05/19 0423 04/06/19 0314 04/07/19 0333  NA 139 139 140 138  K 4.1 3.8 3.8 3.4*  CL 104 106 106 105  CO2 26 28 26 28   GLUCOSE 148* 104* 90 95  BUN 28* 26* 27* 29*  CREATININE 1.51* 1.34* 1.62* 1.62*  CALCIUM 8.9 8.4* 8.9 8.7*   Liver Function Tests: Recent Labs  Lab 04/04/19 2222 04/05/19 0423  AST 34 31  ALT 22 19  ALKPHOS 234* 206*  BILITOT 1.5* 1.7*  PROT 8.7* 7.6  ALBUMIN 3.1* 2.7*   No results for input(s): LIPASE, AMYLASE in the last 168 hours. Recent Labs  Lab  04/04/19 2325 04/05/19 0423 04/06/19 0314 04/07/19 0333  AMMONIA 82* 62* 38* 29   CBC: Recent Labs  Lab 04/04/19 2222 04/05/19 0423  WBC 7.1 5.6  HGB 9.2* 7.9*  HCT 29.5* 25.6*  MCV 112.6* 112.8*  PLT 108* 89*   Cardiac Enzymes: No results for input(s): CKTOTAL, CKMB, CKMBINDEX, TROPONINI in the last 168 hours. BNP: Invalid input(s): POCBNP CBG: No results for input(s): GLUCAP in the last 168 hours. D-Dimer No results for input(s): DDIMER in the last 72 hours. Hgb A1c No results for input(s): HGBA1C in the last 72 hours. Lipid Profile No results for input(s): CHOL, HDL, LDLCALC, TRIG, CHOLHDL, LDLDIRECT in the last 72 hours. Thyroid function studies Recent Labs    04/05/19 0423  TSH 2.342   Anemia work up No results for input(s): VITAMINB12, FOLATE, FERRITIN, TIBC, IRON, RETICCTPCT in the last 72 hours. Urinalysis    Component Value Date/Time   COLORURINE AMBER (A) 04/04/2019 2222   APPEARANCEUR CLEAR (A) 04/04/2019 2222   APPEARANCEUR CLOUDY 02/22/2012 1144   LABSPEC 1.019 04/04/2019 2222   LABSPEC 1.004 02/22/2012 1144   PHURINE 6.0 04/04/2019 2222   GLUCOSEU NEGATIVE 04/04/2019 2222   GLUCOSEU Negative 02/22/2012 1144   HGBUR NEGATIVE 04/04/2019 2222   BILIRUBINUR NEGATIVE 04/04/2019 2222   BILIRUBINUR Negative 02/22/2012 [place] 04/04/2019 2222   PROTEINUR 30 (A) 04/04/2019 2222   NITRITE NEGATIVE 04/04/2019 2222   LEUKOCYTESUR NEGATIVE 04/04/2019 2222   LEUKOCYTESUR Negative 02/22/2012 1144   Sepsis Labs Invalid input(s): PROCALCITONIN,  WBC,  LACTICIDVEN Microbiology Recent Results (from the past 240 hour(s))  Urine culture     Status: None   Collection Time: 04/04/19 10:22 PM   Specimen: In/Out Cath Urine  Result Value Ref Range Status   Specimen Description   Final    IN/OUT CATH URINE Performed at [facility], [address], [place], [place] [address]    Special  Requests   Final    NONE Performed at  [facility], [address] [place], [place] [address]    Culture   Final    NO GROWTH Performed at [facility], [place] [address] [place], [place] [address]    Report Status 04/06/2019 FINAL  Final  Blood Culture (routine x 2)     Status: None (Preliminary result)   Collection Time: 04/04/19 11:25 PM   Specimen: BLOOD  Result Value Ref Range Status   Specimen Description   Final    BLOOD BLOOD RIGHT FOREARM Performed at [facility] [address] [place], [place] [address]    Special Requests   Final    BOTTLES DRAWN AEROBIC AND ANAEROBIC Blood Culture adequate volume Performed at [facility] [address], [place], [place] [address]    Culture   Final    NO GROWTH 2 DAYS Performed at [facility], [address] [place], [place] [address]    Report Status PENDING  Incomplete  Blood Culture (routine x 2)     Status: None (Preliminary result)   Collection Time: 04/04/19 11:25 PM   Specimen: BLOOD  Result Value Ref Range Status   Specimen Description   Final    BLOOD LEFT ANTECUBITAL Performed at [facility] [address] [place], [place] [address]    Special Requests   Final    BOTTLES DRAWN AEROBIC AND ANAEROBIC Blood Culture results may not be optimal due to an inadequate volume of blood received in culture bottles Performed at [facility] [address], [place],  [address]    Culture   Final    NO GROWTH 2 DAYS Performed at [facility], [address] [place],  [address]    Report Status PENDING  Incomplete  Respiratory Panel by RT PCR (Flu A&B, Covid) -     Status: None   Collection Time: 04/05/19 12:31 AM  Result Value Ref Range Status   SARS Coronavirus 2 by RT PCR NEGATIVE NEGATIVE Final    Comment: (NOTE) SARS-CoV-2 target nucleic acids are NOT DETECTED. The SARS-CoV-2 RNA is generally detectable in upper  respiratoy specimens during the acute phase of infection. The lowest concentration of SARS-CoV-2 viral copies this assay can detect is 131 copies/mL. A negative result does not preclude SARS-Cov-2 infection and should not be used as the sole basis for treatment or other patient management decisions. A negative result may occur with  improper specimen collection/handling, submission of specimen other than nasopharyngeal swab, presence of viral mutation(s) within the areas targeted by this assay, and inadequate number of viral copies (<131 copies/mL). A negative result must be combined with clinical observations, patient history, and epidemiological information. The expected result is Negative. Fact Sheet for Patients:  PinkCheek.be Fact Sheet for Healthcare Providers:  GravelBags.it This test is not yet ap proved or cleared by the [place] FDA and  has been authorized for detection and/or diagnosis of SARS-CoV-2 by FDA under an Emergency Use Authorization (EUA). This EUA will remain  in effect (meaning this test can be used) for the duration of the COVID-19 declaration under Section 564(b)(1) of the Act, 21 U.S.C. section 360bbb-3(b)(1), unless the authorization is terminated or revoked sooner.    Influenza A by PCR NEGATIVE NEGATIVE Final   Influenza B by PCR NEGATIVE NEGATIVE Final    Comment: (NOTE) The Xpert Xpress SARS-CoV-2/FLU/RSV assay is intended as an aid in  the diagnosis of influenza from Nasopharyngeal swab specimens and  should not be used as a sole basis for treatment. Nasal washings and  aspirates are unacceptable for Xpert Xpress SARS-CoV-2/FLU/RSV  testing. Fact Sheet for Patients: PinkCheek.be Fact Sheet for Healthcare Providers: GravelBags.it This test is not yet approved or cleared by the [place] FDA and  has been authorized for  detection and/or diagnosis of SARS-CoV-2 by  FDA under an Emergency Use Authorization (EUA). This EUA will remain  in effect (meaning this test can be used) for the duration of the  Covid-19 declaration under Section 564(b)(1) of the Act, 21  U.S.C. section 360bbb-3(b)(1), unless the authorization is  terminated or revoked. Performed at [facility], [address] [place], [place] [address]   MRSA PCR Screening     Status: Abnormal   Collection Time: 04/05/19  2:57 PM   Specimen: Nasal Mucosa; Nasopharyngeal  Result Value Ref Range Status   MRSA by PCR POSITIVE (A) NEGATIVE Final    Comment:        The GeneXpert MRSA Assay (FDA approved for NASAL specimens only), is one component of a comprehensive MRSA colonization surveillance program. It is not intended to diagnose MRSA infection nor to guide or monitor treatment for MRSA infections. RESULT CALLED TO, READ BACK BY AND VERIFIED WITH: Elizabeth Palau RN AT [phone] ON 04/05/19 [facility] Performed at [facility], [address] [place], [place] [address]      Time coordinating discharge: Over 30 minutes  SIGNED:   Nolberto Hanlon, MD  [facility] 04/07/2019, 4:10 PM Pager   If 7PM-7AM, please contact night-coverage [email or web address] Password [email or web address]

## 2019-04-07 NOTE — Progress Notes (Signed)
[place] INFECTIOUS DISEASE PROGRESS NOTE Date of Admission:  04/04/2019     ID: [name] is a 69 y.o. male with LLE cellutlis Active Problems:   Hepatic encephalopathy ([place])   Cellulitis   Hypothyroidism   Acute renal failure superimposed on chronic kidney disease (HCC)   Subjective: No fevers, leg improving with elevation last night.   ROS  Eleven systems are reviewed and negative except per hpi  Medications:  Antibiotics Given (last 72 hours)    Date/Time Action Medication Dose Rate   04/04/19 2354 New Bag/Given   levofloxacin (LEVAQUIN) IVPB 750 mg 750 mg 100 mL/hr   04/05/19 0009 New Bag/Given   vancomycin (VANCOCIN) IVPB 1000 mg/200 mL premix 1,000 mg 200 mL/hr   04/05/19 0114 New Bag/Given   vancomycin (VANCOCIN) IVPB 1000 mg/200 mL premix 1,000 mg 200 mL/hr   04/05/19 3474 New Bag/Given   cefTRIAXone (ROCEPHIN) 1 g in sodium chloride 0.9 % 100 mL IVPB 1 g 200 mL/hr   04/06/19 1522 New Bag/Given   ceFAZolin (ANCEF) IVPB 2g/100 mL premix 2 g 200 mL/hr   04/06/19 2216 New Bag/Given   ceFAZolin (ANCEF) IVPB 2g/100 mL premix 2 g 200 mL/hr   04/07/19 0546 New Bag/Given   ceFAZolin (ANCEF) IVPB 2g/100 mL premix 2 g 200 mL/hr     . allopurinol  300 mg Oral Daily  . apixaban  5 mg Oral BID  . atorvastatin  10 mg Oral Daily  . cephALEXin  500 mg Oral Q6H  . Chlorhexidine Gluconate Cloth  6 each Topical Q0600  . colchicine  0.3 mg Oral Daily  . lactobacillus acidophilus  2 tablet Oral TID  . lactulose  30 g Oral TID  . levothyroxine  100 mcg Oral q morning - 10a  . magnesium oxide  800 mg Oral BID  . metoprolol succinate  25 mg Oral Daily  . mupirocin ointment  1 application Nasal BID  . pantoprazole  20 mg Oral Daily  . potassium chloride  40 mEq Oral Once    Objective: Vital signs in last 24 hours: Temp:  [98.1 F (36.7 C)-98.5 F (36.9 C)] 98.5 F (36.9 C) (03/19 0752) Pulse Rate:  [64-89] 89 (03/19 0752) Resp:  [18] 18 (03/19 0752) BP:  (116-119)/(56-75) 116/56 (03/19 0752) SpO2:  [98 %] 98 % (03/19 0752) Constitutional: He is oriented to person, place, and time. He appears well-developed and well-nourished. No distress. obese HENT: anicteric Mouth/Throat: Oropharynx is clear and moist. No oropharyngeal exudate.  Cardiovascular: Normal rate, regular rhythm and normal heart sounds. Exam reveals no gallop and no friction rub.  No murmur heard.  Pulmonary/Chest: Effort normal and breath sounds normal. No respiratory distress. He has no wheezes.  Abdominal: Soft. Bowel sounds are normal. He exhibits no distension. There is no tenderness.  Lymphadenopathy: He has no cervical adenopathy.  Neurological: He is alert and oriented to person, place, and time.  Ext 2+ RLE edema, trace LLE  Skin: erythema over L leg with edema. Healed prior skin bxp site. No open wounds or purulence Psychiatric: He has a normal mood and affect. His behavior is normal.   Lab Results Recent Labs    04/04/19 2222 04/04/19 2222 04/05/19 0423 04/05/19 0423 04/06/19 0314 04/07/19 0333  WBC 7.1  --  5.6  --   --   --   HGB 9.2*  --  7.9*  --   --   --   HCT 29.5*  --  25.6*  --   --   --  NA 139   < > 139   < > 140 138  K 4.1   < > 3.8   < > 3.8 3.4*  CL 104   < > 106   < > 106 105  CO2 26   < > 28   < > 26 28  BUN 28*   < > 26*   < > 27* 29*  CREATININE 1.51*   < > 1.34*   < > 1.62* 1.62*   < > = values in this interval not displayed.    Microbiology: Results for orders placed or performed during the hospital encounter of 04/04/19  Urine culture     Status: None   Collection Time: 04/04/19 10:22 PM   Specimen: In/Out Cath Urine  Result Value Ref Range Status   Specimen Description   Final    IN/OUT CATH URINE Performed at [facility], [address], [place], [place] [address]    Special Requests   Final    NONE Performed at [facility], [address], [place], [place] [address]    Culture   Final    NO  GROWTH Performed at [facility], [place] [address] [place], [place] [address]    Report Status 04/06/2019 FINAL  Final  Blood Culture (routine x 2)     Status: None (Preliminary result)   Collection Time: 04/04/19 11:25 PM   Specimen: BLOOD  Result Value Ref Range Status   Specimen Description   Final    BLOOD BLOOD RIGHT FOREARM Performed at [facility] [address], [place], [place] [address]    Special Requests   Final    BOTTLES DRAWN AEROBIC AND ANAEROBIC Blood Culture adequate volume Performed at [facility] [address] [place], [place] [address]    Culture   Final    NO GROWTH 2 DAYS Performed at [facility], [address] [place], [place] [address]    Report Status PENDING  Incomplete  Blood Culture (routine x 2)     Status: None (Preliminary result)   Collection Time: 04/04/19 11:25 PM   Specimen: BLOOD  Result Value Ref Range Status   Specimen Description   Final    BLOOD LEFT ANTECUBITAL Performed at [facility] [address] [place], [place] [address]    Special Requests   Final    BOTTLES DRAWN AEROBIC AND ANAEROBIC Blood Culture results may not be optimal due to an inadequate volume of blood received in culture bottles Performed at [facility] [address] [place], [place] [address]    Culture   Final    NO GROWTH 2 DAYS Performed at [facility], [address] [place], [place] [address]    Report Status PENDING  Incomplete  Respiratory Panel by RT PCR (Flu A&B, Covid) -     Status: None   Collection Time: 04/05/19 12:31 AM  Result Value Ref Range Status   SARS Coronavirus 2 by RT PCR NEGATIVE NEGATIVE Final    Comment: (NOTE) SARS-CoV-2 target nucleic acids are NOT DETECTED. The SARS-CoV-2 RNA is generally detectable in upper respiratoy specimens during the acute phase of infection. The lowest concentration of SARS-CoV-2 viral copies  this assay can detect is 131 copies/mL. A negative result does not preclude SARS-Cov-2 infection and should not be used as the sole basis for treatment or other patient management decisions. A negative result may occur with  improper  specimen collection/handling, submission of specimen other than nasopharyngeal swab, presence of viral mutation(s) within the areas targeted by this assay, and inadequate number of viral copies (<131 copies/mL). A negative result must be combined with clinical observations, patient history, and epidemiological information. The expected result is Negative. Fact Sheet for Patients:  PinkCheek.be Fact Sheet for Healthcare Providers:  GravelBags.it This test is not yet ap proved or cleared by the [place] FDA and  has been authorized for detection and/or diagnosis of SARS-CoV-2 by FDA under an Emergency Use Authorization (EUA). This EUA will remain  in effect (meaning this test can be used) for the duration of the COVID-19 declaration under Section 564(b)(1) of the Act, 21 U.S.C. section 360bbb-3(b)(1), unless the authorization is terminated or revoked sooner.    Influenza A by PCR NEGATIVE NEGATIVE Final   Influenza B by PCR NEGATIVE NEGATIVE Final    Comment: (NOTE) The Xpert Xpress SARS-CoV-2/FLU/RSV assay is intended as an aid in  the diagnosis of influenza from Nasopharyngeal swab specimens and  should not be used as a sole basis for treatment. Nasal washings and  aspirates are unacceptable for Xpert Xpress SARS-CoV-2/FLU/RSV  testing. Fact Sheet for Patients: PinkCheek.be Fact Sheet for Healthcare Providers: GravelBags.it This test is not yet approved or cleared by the [place] FDA and  has been authorized for detection and/or diagnosis of SARS-CoV-2 by  FDA under an Emergency Use Authorization (EUA). This EUA will remain  in  effect (meaning this test can be used) for the duration of the  Covid-19 declaration under Section 564(b)(1) of the Act, 21  U.S.C. section 360bbb-3(b)(1), unless the authorization is  terminated or revoked. Performed at [facility], [address] [place], [place] [address]   MRSA PCR Screening     Status: Abnormal   Collection Time: 04/05/19  2:57 PM   Specimen: Nasal Mucosa; Nasopharyngeal  Result Value Ref Range Status   MRSA by PCR POSITIVE (A) NEGATIVE Final    Comment:        The GeneXpert MRSA Assay (FDA approved for NASAL specimens only), is one component of a comprehensive MRSA colonization surveillance program. It is not intended to diagnose MRSA infection nor to guide or monitor treatment for MRSA infections. RESULT CALLED TO, READ BACK BY AND VERIFIED WITH: Elizabeth Palau RN AT [phone] ON 04/05/19 [facility] Performed at [facility], [address], [place], [place] [address]     Studies/Results: No results found.  Assessment/Plan: [name] is a 69 y.o. 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  He was admitted with some confusion likely due to hepatic encephalopathy.  He was also found to have left lower extremity cellulitis.  There are no open wounds.  His MRSA PCR is positive but this does not really affect her lower extremity cellulitis as this is a nonpurulent cellulitis. I am concerned about the lesions seen on ultrasound of the left lower extremity.  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  I have messaged Dr. Grayland Ormond his oncologist about these.  3/19- no fevers, legs less red.  Recommendations Can dc on keflex 500 QID for 10 days FU me in clinic next week. Can try to place LLE unnawrap prior to DC and I can change in clinic next week. Discussed with patient and his wife regarding elevation and compression. He will need to follow-up with oncology soon after discharge for these  lesions in his leg.   I can  see in follow-up in 1 week Thank you very much for the consult. Will follow with you.  Leonel Ramsay   04/07/2019, 1:16 PM

## 2019-04-07 NOTE — Progress Notes (Signed)
Pt ready for discharge home today per MD. Patient assessment unchanged from this morning. Applied dressing to LLE per wound care recommendations. Reviewed discharge instructions and prescriptions with pt and his significant other; all questions answered and pt verbalized understanding. PIV removed, VSS. Pt assisted to car via NT.

## 2019-04-07 NOTE — Consult Note (Signed)
Pharmacy Antibiotic Note  [name] is a 69 y.o. male admitted on 04/04/2019 with cellulitis.    Pharmacy has been consulted for Cefazolin dosing.  Plan: continue cefazolin 2g IV q8h    Height: 6\' 2"  (188 cm) Weight: 207 lb (93.9 kg) IBW/kg (Calculated) : 82.2  Temp (24hrs), Avg:98.3 F (36.8 C), Min:98.1 F (36.7 C), Max:98.5 F (36.9 C)  Recent Labs  Lab 04/04/19 2222 04/04/19 2223 04/05/19 0117 04/05/19 0423 04/06/19 0314 04/07/19 0333  WBC 7.1  --   --  5.6  --   --   CREATININE 1.51*  --   --  1.34* 1.62* 1.62*  LATICACIDVEN  --  1.8 1.2  --   --   --     Estimated Creatinine Clearance: 50 mL/min (A) (by C-G formula based on SCr of 1.62 mg/dL (H)).    Allergies  Allergen Reactions  . Penicillins Hives    Antimicrobials this admission: Levofloxacin 3/16 x 1 Vancomycin 3/17 x 1 Ceftriaxone 3/17 x 1 Cefazolin 3/18 >>  Dose adjustments this admission: None  Microbiology results: 3/16 BCx: NG x 1 day 3/16 UCx: NG final  3/17 MRSA PCR: +  Thank you for allowing pharmacy to be a part of this patient's care.  Noralee Space, PharmD, BCPS Clinical Pharmacist 04/07/2019 1:21 PM

## 2019-04-07 NOTE — Care Management Important Message (Signed)
Important Message  Patient Details  Name: [name] MRN: [id number] Date of Birth: 04-27-1950   Medicare Important Message Given:  N/A - LOS <3 / Initial given by admissions     Juliann Pulse A Glenna Brunkow 04/07/2019, 7:41 AM

## 2019-04-10 ENCOUNTER — Other Ambulatory Visit: Payer: Self-pay

## 2019-04-10 ENCOUNTER — Encounter: Payer: Self-pay | Admitting: Physician Assistant

## 2019-04-10 ENCOUNTER — Ambulatory Visit (INDEPENDENT_AMBULATORY_CARE_PROVIDER_SITE_OTHER): Payer: PPO | Admitting: Physician Assistant

## 2019-04-10 VITALS — BP 110/57 | HR 72 | Ht 74.0 in | Wt 235.0 lb

## 2019-04-10 DIAGNOSIS — E039 Hypothyroidism, unspecified: Secondary | ICD-10-CM

## 2019-04-10 DIAGNOSIS — I428 Other cardiomyopathies: Secondary | ICD-10-CM | POA: Diagnosis not present

## 2019-04-10 DIAGNOSIS — I480 Paroxysmal atrial fibrillation: Secondary | ICD-10-CM

## 2019-04-10 DIAGNOSIS — I10 Essential (primary) hypertension: Secondary | ICD-10-CM | POA: Diagnosis not present

## 2019-04-10 DIAGNOSIS — I361 Nonrheumatic tricuspid (valve) insufficiency: Secondary | ICD-10-CM

## 2019-04-10 DIAGNOSIS — E785 Hyperlipidemia, unspecified: Secondary | ICD-10-CM

## 2019-04-10 DIAGNOSIS — I25118 Atherosclerotic heart disease of native coronary artery with other forms of angina pectoris: Secondary | ICD-10-CM | POA: Diagnosis not present

## 2019-04-10 DIAGNOSIS — I5022 Chronic systolic (congestive) heart failure: Secondary | ICD-10-CM

## 2019-04-10 DIAGNOSIS — I472 Ventricular tachycardia: Secondary | ICD-10-CM

## 2019-04-10 DIAGNOSIS — Z79899 Other long term (current) drug therapy: Secondary | ICD-10-CM

## 2019-04-10 DIAGNOSIS — I4729 Other ventricular tachycardia: Secondary | ICD-10-CM

## 2019-04-10 DIAGNOSIS — I5020 Unspecified systolic (congestive) heart failure: Secondary | ICD-10-CM | POA: Diagnosis not present

## 2019-04-10 LAB — CULTURE, BLOOD (ROUTINE X 2)
Culture: NO GROWTH
Culture: NO GROWTH
Special Requests: ADEQUATE

## 2019-04-10 NOTE — Patient Instructions (Addendum)
Medication Instructions:  - Your physician recommends that you continue on your current medications as directed. Please refer to the Current Medication list given to you today.  - We will refill your magnesium pending the results of your lab work from today  *If you need a refill on your cardiac medications before your next appointment, please call your pharmacy*   Lab Work: - Your physician recommends that you have lab work today: BMP/ Magnesium/ CBC  If you have labs (blood work) drawn today and your tests are completely normal, you will receive your results only by: [name] MyChart Message (if you have MyChart) OR . A paper copy in the mail If you have any lab test that is abnormal or we need to change your treatment, we will call you to review the results.   Testing/Procedures: - none ordered   Follow-Up: At [facility], you and your health needs are our priority.  As part of our continuing mission to provide you with exceptional heart care, we have created designated Provider Care Teams.  These Care Teams include your primary Cardiologist (physician) and Advanced Practice Providers (APPs -  Physician Assistants and Nurse Practitioners) who all work together to provide you with the care you need, when you need it.  We recommend signing up for the patient portal called "MyChart".  Sign up information is provided on this After Visit Summary.  MyChart is used to connect with patients for Virtual Visits (Telemedicine).  Patients are able to view lab/test results, encounter notes, upcoming appointments, etc.  Non-urgent messages can be sent to your provider as well.   To learn more about what you can do with MyChart, go to [email or web address].    Your next appointment:   2 week(s)  The format for your next appointment:   In Person  Provider:    You may see Ida Rogue, MD or Marrianne Mood, PA    Other Instructions - if you decide to proceed with seeing an  electrophysiologist in our group, Dr. Virl Axe is the provider for that

## 2019-04-10 NOTE — Progress Notes (Signed)
Afib   Office Visit    Patient Name: [name] Date of Encounter: 04/10/2019  Primary Care Provider:  Albina Billet, MD Primary Cardiologist:  Ida Rogue, MD  Chief Complaint    Chief Complaint  Patient presents with  . OTHER    Discuss medication changes no complaints today. Meds reviewed verbally with pt.   69 year old male with history of NICM, CAD s/p LHC 02/15/2019, chronic HFrEF, Afib s/p DCCV 02/16/2019, hypothyroidism, CKD, cirrhosis/NASH, GERD, pre-DM2, MGUS, h/o colonic adenomas , h/o esophageal dysphagia, past history of alcohol and tobacco use, leukocytoclastic vasculitis, and who presents today for 2 month follow-up.  Past Medical History    Past Medical History:  Diagnosis Date  . AF (atrial fibrillation) ([place])   . Cardiomyopathy ([place])   . CHF (congestive heart failure) ([place])   . Diabetes mellitus without complication ([place])   . Fatty liver   . H/O ETOH abuse   . Hx of adenomatous polyp of colon   . Hx of thrombocytopenia   . Hypertension   . Serum lipids high   . Thyroid disease    Past Surgical History:  Procedure Laterality Date  . CARDIAC CATHETERIZATION     no stents  . COLONOSCOPY    . COLONOSCOPY WITH PROPOFOL N/A 07/26/2015   Procedure: COLONOSCOPY WITH PROPOFOL;  Surgeon: Manya Silvas, MD;  Location: [facility] ENDOSCOPY;  Service: Endoscopy;  Laterality: N/A;  . HAMMER TOE SURGERY      Allergies  Allergies  Allergen Reactions  . Penicillins Hives    History of Present Illness    [name] is a 69 y.o. male with PMH as above.  He has recently been followed by [facility], and [facility] health cardiology with all records reviewed by myself prior to today's appointment.  He established with the [place] office 03/28/2019 with Dr. Rockey Situ.    He was last seen by [facility] 02/07/2019 for follow-up of 2 weeks of lower extremity edema. He had been instructed to increase furosemide to 40 mg daily with minimal increase in urine output and  recent 18 pound weight gain since his last office visit.  It was noted at that time that his 05/05/2018 echo showed LVEF 35 to 40% with global hypocontractility, severe biatrial enlargement, moderately enlarged right ventricle, mild aortic and pulmonic insufficiency, moderate mitral and tricuspid regurgitation, and moderate pulmonary hypertension.  He was on ASA but not OAC at that time and has since started on eliquis. His SBP was mildly elevated on lisinopril, which has since been transitioned to [place] per [facility].   He was admitted to [facility] 01/2019 with b/l LEE, tremor, and AMS and found to have elevated Cr and volume overload with decompensated heart failure. Echo showed worsening EF 25 to 30% and moderate mitral valve regurgitation with reduced RV function and severe TR.  It was noted that, prior to admission, he was on lisinopril, metoprolol, digoxin, and furosemide 54m with metolazone and with good adherence. He was IV diuresed and -13L and near 30lbs during his hospital stay. Given he had never had a prior ischemic evaluation, he underwent PET CT with multiple reversible defects. He underwent LHC 1/21 that demonstrated nonobstructive dz and pulmonary HTN. It was noted that the etiology for CM remained unclear, as LHC did not identify enough CAD to account for his reducedLV function.  He was started on Entresto and continued on metoprolol. Given his history of MGUS, amyloid was noted to remain a consideration, though it  was overall favored to be less likely.  He underwent cardiac MRI (reviewed in EMR today with patient) without evidence of infiltrative cardiomyopathy. Both ICD and ablation were discussed during his admission. Given his recent cath, it was suspected that his reduced EF was likely 2/2 previous heavy alcohol use and Afib / NSVT/ tachycardia mediated CM. He thus underwent 02/16/19 cardioversion with restoration of NSR. Digoxin was stopped 2/2 bradycardia / hypotension, as following cardioversion, he  initially had junctional bradycardia with slow return of sinus node function. He was started on Eliquis at discharge. Discharge weight was 213 lbs, and he was discharged on lasix 50m daily with instructions for PM dosing for weight gain. He was discharged with a Life-vest given NSVT during admission [facility]. It was noted spironolactone should be considered as an outpatient and pending renal function.   Per review of medical records, he has since undergone cardiac MRI that has been negative for infiltrative cardiomyopathy.  In addition, he has been set up for 05/2019 ablation.     [facility] had vague complaints of orthostasis. He had very high intake with weight trending upwards and recommendation to take an additional 447mof lasix after lunch for ankle edema. He was instructed to call the office if home weight went above 220lbs. [facility], AOCKD, and left lower extremity cellulitis. Due to low BP with AKI, his Entresto and Lasix were held. ID recommended discharging on Keflex for his cellulitis.   Today, he presents with his wife with request for restart of Entresto and Lasix that were discontinued at his most recent admission.  He denies any chest pain but does report racing heart rate at times.  He also notes continued orthostatic/dizziness; however, no recent syncope or falls.  He does note unstable gait.  Relatively sedentary, only ambulating to the restroom and kitchen.  He denies any orthopnea but does note abdominal distention and bilateral lower extremity edema.  No PND or early satiety.  No palpitations.  He denies any signs or symptoms consistent with bleeding and does report compliance with Eliquis.  Today, he asked for refill of his magnesium oxide 40081mwhich she reportedly takes 4 times daily.  [place] [facility] [place] [facility] in [place] with recommendation to choose a specific cardiology group moving forward for ease of management. He reports today that his home weight is 319lbs and that his weight today is elevated 2/2 the lifevest battery pack, shoes, and clothes. He reports drinking ~3  Of 16 oz bottles of water a day.   Home Medications    Prior to Admission medications   Medication Sig Start Date End Date Taking? Authorizing Provider  apixaban (ELIQUIS) 5 MG TABS tablet Take by mouth 2 (two) times daily.  02/18/19   [provider]  atorvastatin (LIPITOR) 10 MG tablet Take 10 mg by mouth daily.    [provider]  cephALEXin (KEFLEX) 500 MG capsule Take 1 capsule (500 mg total) by mouth every 6 (six) hours for 9 days. 04/07/19 04/16/19  AmeNolberto HanlonD  clobetasol cream (TEMOVATE) 0.09.83Apply 1 application topically 2 (two) times daily.    [provider]  gabapentin (NEURONTIN) 100 MG capsule Take 100 mg by mouth 4 (four) times daily.    [provider]  lactobacillus acidophilus (  BACID) TABS tablet Take 2 tablets by mouth 3 (three) times daily. 04/07/19   Nolberto Hanlon, MD  lactulose (CHRONULAC) 10 GM/15ML solution Take 45 mLs (30 g total) by mouth 2 (two) times daily. 04/07/19 05/07/19  Nolberto Hanlon, MD  levothyroxine (SYNTHROID, LEVOTHROID) 100 MCG tablet Take 100 mcg by mouth every morning. 10/29/17   [provider]  magnesium oxide (MAG-OX) 400 MG tablet Take 2 tablets by mouth 2 (two) times daily.  02/18/19 02/18/20  [provider]  metoprolol succinate (TOPROL-XL) 25 MG 24 hr tablet 25 mg daily.  02/10/16   [provider]  mupirocin ointment (BACTROBAN) 2 % Place 1 application into the nose 2 (two) times daily. 04/07/19   Nolberto Hanlon, MD  pantoprazole (PROTONIX) 20 MG tablet Take 20 mg by mouth daily.  02/02/19   [provider]    Review of Systems    He denies chest pain, palpitations, dyspnea, pnd, orthopnea,  n, v, syncope, weight gain, or early satiety.  He reports intermittent racing heart rate, gait instability, continued orthostatic hypotension/dizziness, bilateral lower extremity edema, abdominal distention, and fatigue.   all other systems reviewed and are otherwise negative except as noted above.  Physical Exam    VS:  BP (!) 110/57 (BP Location: Right Arm, Patient Position: Sitting, Cuff Size: Normal)   Pulse 72   Ht 6' 2"  (1.88 m)   Wt 235 lb (106.6 kg)   SpO2 97%   BMI 30.17 kg/m  , BMI Body mass index is 30.17 kg/m. GEN: Frail and elderly male, in no acute distress. HEENT: normal. Neck: Supple, no carotid bruits.  Cardiac: RRR with frequent extrasystole appreciated, 1/6 diastolic murmur, No rubs, or gallops. No clubbing, cyanosis, bilateral moderate lower extremity edema with L leg erythema 2/2 infection and LLE edema  >RLEE.  Radials/DP/PT 2+ and equal bilaterally. Legs not tender to palpation. LLE warmth noted with recent US duplex negative as above / below. Respiratory:  Respirations regular and unlabored, clear to auscultation bilaterally. GI: Soft, nontender, nondistended, BS + x 4. MS: no deformity or atrophy. Skin: warm and dry, no rash. Neuro:  Strength and sensation are intact. Psych: Normal affect.  Accessory Clinical Findings    ECG personally reviewed by me today - NSR, 79bpm, known RBBB, frequent PACs / PVCs (rhythm strip taken) with compensatory pauses, nonspecific ST/T abnormalities - no acute changes from previous.  VITALS Reviewed today   Temp Readings from Last 3 Encounters:  04/07/19 98.1 F (36.7 C) (Oral)  02/24/19 (!) 96.1 F (35.6 C) (Tympanic)  02/22/19 (!) 97.4 F (36.3 C) (Tympanic)   BP Readings from Last 3 Encounters:  04/10/19 (!) 110/57  04/07/19 102/73  03/28/19 116/62   Pulse Readings from Last 3 Encounters:  04/10/19 72  04/07/19 84  03/28/19 72    Wt Readings from Last 3 Encounters:  04/10/19 235 lb (106.6 kg)  04/04/19 207 lb  (93.9 kg)  03/28/19 227 lb (103 kg)     LABS  reviewed today   [facility] Labs present? Yes/No: Yes but [facility] most recent  Lab Results  Component Value Date   WBC 5.6 04/05/2019   HGB 7.9 (L) 04/05/2019   HCT 25.6 (L) 04/05/2019   MCV 112.8 (H) 04/05/2019   PLT 89 (L) 04/05/2019   Lab Results  Component Value Date   CREATININE 1.62 (H) 04/07/2019   BUN 29 (H) 04/07/2019   NA 138 04/07/2019   K 3.4 (L) 04/07/2019   CL 105 04/07/2019  CO2 28 04/07/2019   Lab Results  Component Value Date   ALT 19 04/05/2019   AST 31 04/05/2019   ALKPHOS 206 (H) 04/05/2019   BILITOT 1.7 (H) 04/05/2019   No results found for: CHOL, HDL, LDLCALC, LDLDIRECT, TRIG, CHOLHDL  Lab Results  Component Value Date   HGBA1C 5.9 (H) 03/09/2017   Lab Results  Component Value Date   TSH 2.342 04/05/2019     STUDIES/PROCEDURES reviewed today   Cardiac MRI 04/03/19 IMPRESSION: No CMR evidence of infiltrative cardiomyopathy. No myocardial scarring or LGE. Hypokinetic left ventricle with visually estimated ejection fraction between 30-40%.  Cath / PCI: LHC/RHC 02/15/19 1. Non-flow-limiting coronary artery disease including a 60% ostial D2 stenosis. 2. Mildly elevated left ventricular filling pressures (LVEDP = 13 mm Hg). 3. Elevated right heart filling pressures; RA 11, RV 65/8, PA 61/26 (37), PCW 12, PVR 3.8 4. Moderate pulmonary hypertension. 5. Preserved cardiac output and index; FCO/CI 6.5/2.9, TDCO/CI 5.3/2.4  EP Procedures and Devices:  Successful DCCV 02/16/19  TEE 02/15/18 1. There is a no thrombus seen in the left atrium. 2. There is a no thrombus seen in the right atrium. 3. There is moderate posteriorly directed mitral regurgitation. 4. There is mild aortic regurgitation.  TTE 02/10/18 1. The left ventricle is mildly dilated in size with normal wall thickness. 2. The left ventricular systolic function is severely decreased, LVEF is visually estimated at 25-30%. 3. The mitral  valve leaflets are mildly thickened with normal leaflet mobility. 4. There is moderate mitral valve regurgitation. 5. The aortic valve is trileaflet with mildly thickened leaflets with normal excursion. 6. There is mild aortic regurgitation. 7. The left atrium is moderately dilated in size. 8. The right ventricle is severely dilated in size, with reduced systolic function. 9. There is severe tricuspid regurgitation. 10. There is severe pulmonary hypertension, estimated pulmonary artery systolic pressure is 77 mmHg. 11. The right atrium is severely dilated in size. 12. IVC size and inspiratory change suggest elevated right atrial pressure. (10-20 mmHg).  PET Stress 02/13/18 - Abnormal myocardial perfusion study - There is a small in size, mild in severity, nearly completely reversible defect involving the apical and apical anterior segments. This is consistent with scar with peri-infarct ischemia. - There is a small in size, mild in severity, partially reversible defect involving the basal inferolateral and basal inferolateral segments. This is consistent with scar and liminal peri-infarct ischemia. Cannot rule out artifact. - During stress: Global systolic function is severely reduced. The ejection fraction calculated at 26%. The left ventricle is dilated. Right ventricular chamber size is severely dilated. Right ventricular systolic function is reduced. - 3 vessel coronary calcifications are noted - Small bilateral pleural effusions and large volume ascites are noted - Incidentally noted is a 3 mm nodule in the right upper lobe (series 6, image 19). Comparison with outside films is recommended if available. If not available, then by Fleischner criteria consider a dedicated chest CT in 12 months if cancer risk factors ARE present.   Assessment & Plan    Chronic systolic congestive heart failure, NICM Moderate pulmonary HTN Recent [facility] 01/2019 admission with HFrEF exacerbation (EF  25-30%) --Continues to note bilateral LEE and DOE. He is relatively sedentary with multiple comorbid conditions. Recent 01/2019 [facility] admission for volume overload (see above CV studies). He underwent IV diuresis with improved volume status on exam today.  EF reduced from previous and 25-30%. LHC without significant obstructive dz. Suspicion noted during this admission for reduced EF  2/2 tachycardia induced CM. As below, he has a history of previous heavy alcohol use as well. Due to NSVT and pAfib s/p recent DCCV with bradycardia, referral placed to [facility] EP for evaluation for ICD. He was discharged with a Lifevest. Most recent 03/2019 [facility] admission with lasix and Entresto discontinued due to [place] with low BP. Patient weight 235lbs today; however, he was reportedly weighed with his lifevest pack. Discharge 3/19 weight of 207lbs / 93.9kg. He is relatively euvolemic on exam today with only mild DOE and no SOB at rest reported. Noted bilateral moderate LEE on exam with consideration of comorbid conditions including recent cellulitis of the left leg on Keflex. Suspect sx of SOB and LEE multifactorial in the setting of cellulitis / infection, liver dz, recent smoldering myeloma dx, AKI, anemia, low output HFrEF, pulmonary HTN, anemia, and deconditioning. LLE duplex [facility] and negative for DVT.  --Restart / escalation of GDMT complicated by ongoing hypotension and recent AKI with no repeat labs since admission and comorbid conditions, as well as current Keflex, which is renally metabolized. Given low BP today + AKI (and still on Keflex), defer restart of Entresto / Lasix for now. Recheck a BMET/Mg with reported baseline Cr 1.3-1.5.  Given risk of volume overload with consideration of his echo and studies above, recommend close home monitoring of daily weights/ BP / sx with further recommendations following today's labs. Advised to bring BP log and cuff into next visit and call with any s/sx of worsening  volume status. Agree with ID recommendations for compression stockings and leg elevation to reduce edema. Refill of magnesium oxide deferred until recheck of Mg. Continue current Toprol 27m for now given h/o NSVT and pAFib with frequent extrasystole appreciated today on exam. Given low output HF and soft BP with pulmonary HTN, may need to consider reduced dose BB if ongoing hypotension and to prevent volume overload. Consider repeat echo in 6 months to monitor EF and moderate MR/ mild AR as reported above. Consider ReDS vest at RTC.  Nonobstructive CAD by recent LHC --No current CP. Recent [facility] echo EF 25-30%, [facility] LHC (see above CV studies) without evidence of obstructive CAD, and [facility] cardiac MRI without evidence of infiltrative CM or amyloidosis. 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 He denies any recent alcohol use, as also considered is history of heavy alcohol use. Due to reduced EF, he was started on [facility] with lasix and recommendation for spironolactone after discharge if renal function allowed. Unfortunately, both Entresto and lasix were discontinued 3/17 due to hypotension and AKI with AMS and hepatic encephalopathy, as well as LLE cellulitis and start on renally excreted Keflex regimen for cellulitis.  --Continue Toprol XL 28mfor now. He is currently on low dose Lipitor/statin, and we should closely monitor his LFTs with this medication and given his known steatohepatitis. Most recent LFTs stable; therefore, reasonable to continue this medication at this time. He is on Eliquis in place of ASA with recommendation for repeat CBC as below given most recent Hgb. Recommend monitor weights and blood pressure closely at home.  Paroxysmal atrial fibrillation s/p recent 1//2021 DCCV [facility] --History of paroxysmal Afib and recently noted  NSVT. Denies current / recent sx of racing HR and palpitations. S/p recent ablation for Afib as detailed in HPI. NSR today with frequent ectopy today. Recent  TSH wnl. Updated electrolytes pending today's labs.  --CHA2DS2VASc score of ~ 5 (CHF, HTN, agex1, vascular, likely DM2). Continue Eliquis for now pending repeat CBC given recent Hgb 7.9. No reduced dose OAC at this time, given he does not meet age or weight criteria for reduced dosing. No s/sx of bleeding reported today. Further recommendations pending CBC.  --Continue metoprolol for rate control and with goal HR below 110 or as his low BP with low EF allows. Digoxin discontinued during previous admission given elevated Cr and amiodarone not ideal 2/2 liver dz. Follows with [facility] EP and scheduled for ablation with [facility] EP in 05/2019 and under current evaluation for ICD with Lifevest in place. Long discussion today regarding consolidation of cardiology groups / cardiology care. He prefers to think on it at this time. Continue Lifevest as below per [facility] EP.   AOCKD --As above, cardiorenal etiology of worsening renal function during 01/2019 admission with Cr improved with diuresis. However, given current infection on Keflex (renally dosed) with known reduced EF and low BP, defer restart diuresis at this time. Recheck BMET. As in HPI, digoxin discontinued at 01/2019 [facility] admission in the setting of worsening renal function. Recommendations regarding medications and K/Mg supplementation pending BMET and Mg today. He does not yet qualify for reduced dose Eliquis and will continue on current dose pending repeat CBC. Consider establishing with nephrology with ongoing reduced renal function.   Hypokalemia --Per review of most recent labs. As above, recheck BMET/Mg. Further recommendations pending lab results if needed.   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 As noted in recent 3/19 oncology note, concern for  plasmacytomas noted by ID on review of LLE ultrasound. Consider as contributing to edema. Defer follow-up to hematology / oncology.   Anemia, etiology unclear --Most recent Hgb 7.9. Repeat CBC as on [place]. Consider as contributing to LEE and DOE. Consider comorbid conditions as etiology of anemia, including anemia of chronic dz and multiple myeloma. No report of s/sx consistent with bleeding per patient today. Continue PPI as renal function allows and to prevent GIB. Continue to follow closely with hematology and oncology.    Liver Cirrhosis, Hepatic encephalopathy --Recent admission 03/2019 due to AMS with hepatic encephalopathy. Continue lactulose. Follow-up per IM/GI/oncology. Close monitoring of liver function on low dose statin medication. Most recent ALT/AST stable on 04/05/19 labs. Ongoing alcohol cessation recommended.   Hypothyroidism --Continue Synthroid. 04/05/19 labs with TSH 2.342 and wnl.   Untreated DM2 --A1C 5.9 in 2019. Repeat A1C per PCP. Not on any medications for DM2 / glucose control. Recommend tight glycemic control for control to prevent worsening renal and cardiovascular dz.  Past alcohol and tobacco use --Ongoing cessation advised.   Consideration of ICD for primary prevention --Lifevest in place today. Per patient, the vest has discharged once since [facility] discharge. Recommend follow-up with EP as scheduled.   Medication changes: If need, changes to be made pending labs. Labs ordered: BMET, CBC, Mg. Studies / Imaging ordered: None. Future considerations: Recommendation to choose primary cardiology group for general cardiology and EP, as well as HF management. Follow-up with EP for consideration of ICD with Lifevest per [facility] discharge.  Consider repeat echo to reassess EF in 3-6 months to reassess EF and valvular function, as well as pulmonary pressures. Consider ReDS at RTC. Disposition: RTC 2 weeks or sooner if needed pending labs and sx.  Total time spent with patient  today 55 minutes. This includes reviewing records, evaluating the patient, and  coordinating care. Face-to-face time >50%.    Arvil Chaco, PA-C 04/10/2019

## 2019-04-11 ENCOUNTER — Ambulatory Visit: Payer: PPO | Admitting: Physician Assistant

## 2019-04-11 LAB — CBC WITH DIFFERENTIAL/PLATELET
Basophils Absolute: 0 10*3/uL (ref 0.0–0.2)
Basos: 0 %
EOS (ABSOLUTE): 0.1 10*3/uL (ref 0.0–0.4)
Eos: 3 %
Hematocrit: 27.2 % — ABNORMAL LOW (ref 37.5–51.0)
Hemoglobin: 8.9 g/dL — ABNORMAL LOW (ref 13.0–17.7)
Immature Grans (Abs): 0 10*3/uL (ref 0.0–0.1)
Immature Granulocytes: 1 %
Lymphocytes Absolute: 0.7 10*3/uL (ref 0.7–3.1)
Lymphs: 19 %
MCH: 35.3 pg — ABNORMAL HIGH (ref 26.6–33.0)
MCHC: 32.7 g/dL (ref 31.5–35.7)
MCV: 108 fL — ABNORMAL HIGH (ref 79–97)
Monocytes Absolute: 0.4 10*3/uL (ref 0.1–0.9)
Monocytes: 11 %
Neutrophils Absolute: 2.2 10*3/uL (ref 1.4–7.0)
Neutrophils: 66 %
Platelets: 118 10*3/uL — ABNORMAL LOW (ref 150–450)
RBC: 2.52 x10E6/uL — CL (ref 4.14–5.80)
RDW: 13.6 % (ref 11.6–15.4)
WBC: 3.4 10*3/uL (ref 3.4–10.8)

## 2019-04-11 LAB — BASIC METABOLIC PANEL
BUN/Creatinine Ratio: 15 (ref 10–24)
BUN: 19 mg/dL (ref 8–27)
CO2: 23 mmol/L (ref 20–29)
Calcium: 8.6 mg/dL (ref 8.6–10.2)
Chloride: 104 mmol/L (ref 96–106)
Creatinine, Ser: 1.25 mg/dL (ref 0.76–1.27)
GFR calc Af Amer: 67 mL/min/{1.73_m2} (ref >59)
GFR calc non Af Amer: 58 mL/min/{1.73_m2} — ABNORMAL LOW (ref >59)
Glucose: 91 mg/dL (ref 65–99)
Potassium: 4.7 mmol/L (ref 3.5–5.2)
Sodium: 139 mmol/L (ref 134–144)

## 2019-04-11 LAB — MAGNESIUM: Magnesium: 2.4 mg/dL — ABNORMAL HIGH (ref 1.6–2.3)

## 2019-04-12 ENCOUNTER — Telehealth: Payer: Self-pay

## 2019-04-12 NOTE — Telephone Encounter (Signed)
DPR on file. Spoke with the patients son Shanon Brow. Rosana Hoes made aware of lab results and Marrianne Mood, PA recommendations with verbalized understanding.

## 2019-04-12 NOTE — Telephone Encounter (Signed)
-----   Message from Arvil Chaco, [place] sent at 04/11/2019  6:06 PM EDT ----- Please let him know that, at this time, we will continue him on Eliquis 5mg  BID as recheck of blood counts not consistent with acute blood loss anemia. Please ensure that he calls the office if any melena or bright red blood in the toilet, as well as if coughing up any concerning amounts of blood.   Results forwarded to Dr. Grayland Ormond to keep oncology in the loop.   Will also include GI on this result note as FYI.

## 2019-04-14 DIAGNOSIS — L03116 Cellulitis of left lower limb: Secondary | ICD-10-CM | POA: Diagnosis not present

## 2019-04-14 DIAGNOSIS — I5022 Chronic systolic (congestive) heart failure: Secondary | ICD-10-CM | POA: Diagnosis not present

## 2019-04-14 DIAGNOSIS — C9000 Multiple myeloma not having achieved remission: Secondary | ICD-10-CM | POA: Diagnosis not present

## 2019-04-19 ENCOUNTER — Inpatient Hospital Stay: Payer: PPO | Attending: Oncology

## 2019-04-19 ENCOUNTER — Other Ambulatory Visit: Payer: Self-pay

## 2019-04-19 DIAGNOSIS — D649 Anemia, unspecified: Secondary | ICD-10-CM | POA: Diagnosis not present

## 2019-04-19 DIAGNOSIS — C9000 Multiple myeloma not having achieved remission: Secondary | ICD-10-CM | POA: Diagnosis not present

## 2019-04-19 DIAGNOSIS — D472 Monoclonal gammopathy: Secondary | ICD-10-CM

## 2019-04-19 DIAGNOSIS — I509 Heart failure, unspecified: Secondary | ICD-10-CM | POA: Diagnosis not present

## 2019-04-19 LAB — BASIC METABOLIC PANEL
Anion gap: 6 (ref 5–15)
BUN: 19 mg/dL (ref 8–23)
CO2: 23 mmol/L (ref 22–32)
Calcium: 8.6 mg/dL — ABNORMAL LOW (ref 8.9–10.3)
Chloride: 109 mmol/L (ref 98–111)
Creatinine, Ser: 1.38 mg/dL — ABNORMAL HIGH (ref 0.61–1.24)
GFR calc Af Amer: >60 mL/min (ref >60)
GFR calc non Af Amer: 52 mL/min — ABNORMAL LOW (ref >60)
Glucose, Bld: 94 mg/dL (ref 70–99)
Potassium: 4.6 mmol/L (ref 3.5–5.1)
Sodium: 138 mmol/L (ref 135–145)

## 2019-04-19 LAB — CBC
HCT: 29.7 % — ABNORMAL LOW (ref 39.0–52.0)
Hemoglobin: 9.3 g/dL — ABNORMAL LOW (ref 13.0–17.0)
MCH: 35.2 pg — ABNORMAL HIGH (ref 26.0–34.0)
MCHC: 31.3 g/dL (ref 30.0–36.0)
MCV: 112.5 fL — ABNORMAL HIGH (ref 80.0–100.0)
Platelets: 108 10*3/uL — ABNORMAL LOW (ref 150–400)
RBC: 2.64 MIL/uL — ABNORMAL LOW (ref 4.22–5.81)
RDW: 15.9 % — ABNORMAL HIGH (ref 11.5–15.5)
WBC: 4.7 10*3/uL (ref 4.0–10.5)
nRBC: 0 % (ref 0.0–0.2)

## 2019-04-20 DIAGNOSIS — R42 Dizziness and giddiness: Secondary | ICD-10-CM | POA: Diagnosis not present

## 2019-04-20 DIAGNOSIS — G250 Essential tremor: Secondary | ICD-10-CM | POA: Diagnosis not present

## 2019-04-20 LAB — PROTEIN ELECTROPHORESIS, SERUM
A/G Ratio: 0.6 — ABNORMAL LOW (ref 0.7–1.7)
Albumin ELP: 3.3 g/dL (ref 2.9–4.4)
Alpha-1-Globulin: 0.3 g/dL (ref 0.0–0.4)
Alpha-2-Globulin: 0.6 g/dL (ref 0.4–1.0)
Beta Globulin: 1.1 g/dL (ref 0.7–1.3)
Gamma Globulin: 3.3 g/dL — ABNORMAL HIGH (ref 0.4–1.8)
Globulin, Total: 5.2 g/dL — ABNORMAL HIGH (ref 2.2–3.9)
M-Spike, %: 2.5 g/dL — ABNORMAL HIGH
Total Protein ELP: 8.5 g/dL (ref 6.0–8.5)

## 2019-04-20 LAB — IGG, IGA, IGM
IgA: 483 mg/dL — ABNORMAL HIGH (ref 61–437)
IgG (Immunoglobin G), Serum: 3999 mg/dL — ABNORMAL HIGH (ref 603–1613)
IgM (Immunoglobulin M), Srm: 90 mg/dL (ref 20–172)

## 2019-04-20 LAB — KAPPA/LAMBDA LIGHT CHAINS
Kappa free light chain: 864.1 mg/L — ABNORMAL HIGH (ref 3.3–19.4)
Kappa, lambda light chain ratio: 18.74 — ABNORMAL HIGH (ref 0.26–1.65)
Lambda free light chains: 46.1 mg/L — ABNORMAL HIGH (ref 5.7–26.3)

## 2019-04-20 NOTE — Progress Notes (Signed)
[place]  [phone] [phone]  ID: JOVAUN LEVENE OB: 1950-10-07  MR#: [id number]  AOZ#:[id number]  Patient Care Team: Albina Billet, MD as PCP - General (Internal Medicine) Minna Merritts, MD as PCP - Cardiology (Cardiology) Lloyd Huger, MD as Consulting Physician (Oncology)  CHIEF COMPLAINT: Smoldering myeloma.  INTERVAL HISTORY: Patient returns to clinic today for further evaluation and discussion of his laboratory results.  He continues to have significant cardiac issues and is actively being evaluated by cardiology.  He continues to have chronic weakness and fatigue.  He has no neurologic complaints.  He denies any fevers.  He has a fair appetite.  He denies any chest pain, shortness of breath, cough, or hemoptysis.  He denies any nausea, vomiting, constipation, or diarrhea.  He has no melena or hematochezia.  He has no urinary complaints.  Patient offers no further specific complaints today.  REVIEW OF SYSTEMS:   Review of Systems  Constitutional: Positive for malaise/fatigue. Negative for fever.  Respiratory: Positive for shortness of breath. Negative for cough and hemoptysis.   Cardiovascular: Positive for leg swelling. Negative for chest pain.  Gastrointestinal: Negative.  Negative for abdominal pain.  Genitourinary: Negative.  Negative for dysuria.  Musculoskeletal: Negative.  Negative for back pain.  Skin: Negative.  Negative for rash.  Neurological: Positive for weakness. Negative for dizziness, focal weakness and headaches.  Psychiatric/Behavioral: Negative.  The patient is not nervous/anxious.     As per HPI. Otherwise, a complete review of systems is negative.  PAST MEDICAL HISTORY: Past Medical History:  Diagnosis Date  . AF (atrial fibrillation) ([place])   . Cardiomyopathy ([place])   . CHF (congestive heart failure) ([place])   . Diabetes mellitus without complication ([place])   . Fatty liver   . H/O ETOH abuse   . Hx  of adenomatous polyp of colon   . Hx of thrombocytopenia   . Hypertension   . Serum lipids high   . Thyroid disease     PAST SURGICAL HISTORY: Past Surgical History:  Procedure Laterality Date  . CARDIAC CATHETERIZATION     no stents  . COLONOSCOPY    . COLONOSCOPY WITH PROPOFOL N/A 07/26/2015   Procedure: COLONOSCOPY WITH PROPOFOL;  Surgeon: Manya Silvas, MD;  Location: [facility] ENDOSCOPY;  Service: Endoscopy;  Laterality: N/A;  . HAMMER TOE SURGERY      FAMILY HISTORY: Family History  Problem Relation Age of Onset  . Heart Problems Mother   . Heart Problems Father   . Heart Problems Sister     ADVANCED DIRECTIVES (Y/N):  N  HEALTH MAINTENANCE: Social History   Tobacco Use  . Smoking status: Former Research scientist (life sciences)  . Smokeless tobacco: Current User    Types: Snuff  Substance Use Topics  . Alcohol use: No  . Drug use: No     Colonoscopy:  PAP:  Bone density:  Lipid panel:  Allergies  Allergen Reactions  . Penicillins Hives    Current Outpatient Medications  Medication Sig Dispense Refill  . apixaban (ELIQUIS) 5 MG TABS tablet Take by mouth 2 (two) times daily.     [name] atorvastatin (LIPITOR) 10 MG tablet Take 10 mg by mouth daily.    . cephALEXin (KEFLEX) 500 MG capsule Take 500 mg by mouth 2 (two) times daily. Taking 2 times daily    . gabapentin (NEURONTIN) 100 MG capsule Take 100 mg by mouth 4 (four) times daily.    [name] lactobacillus acidophilus (BACID) TABS tablet Take  2 tablets by mouth 3 (three) times daily. 30 tablet 0  . lactulose (CHRONULAC) 10 GM/15ML solution Take 45 mLs (30 g total) by mouth 2 (two) times daily. 2700 mL 0  . levothyroxine (SYNTHROID, LEVOTHROID) 100 MCG tablet Take 100 mcg by mouth every morning.  4  . metoprolol succinate (TOPROL-XL) 25 MG 24 hr tablet 25 mg daily.     . pantoprazole (PROTONIX) 20 MG tablet Take 20 mg by mouth daily.     . potassium chloride SA (KLOR-CON) 20 MEQ tablet Take 1 tablet (20 mEq total) by mouth as directed. Take 1  tablet (20 mEq) twice a day for 5 days then go to 1 tablet (20 mEq) once daily 180 tablet 3  . sacubitril-valsartan (ENTRESTO) 24-26 MG Take 1 tablet by mouth 2 (two) times daily. 60 tablet 11  . torsemide (DEMADEX) 20 MG tablet Take 1 tablet (20 mg total) by mouth as directed. Take 2 tablets (40 mg) twice a day for 5 days then go to 1 tablet (20 mg) twice a day 180 tablet 3  . clobetasol cream (TEMOVATE) 7.61 % Apply 1 application topically 2 (two) times daily.     No current facility-administered medications for this visit.    OBJECTIVE: Vitals:   04/26/19 1052  BP: (!) 91/52  Pulse: 70  Resp: 18  Temp: (!) 94.5 F (34.7 C)  SpO2: 100%     Body mass index is 28.46 kg/m.    ECOG FS:2 - Symptomatic, <50% confined to bed  General: Well-developed, well-nourished, no acute distress.  Sitting in a wheelchair. Eyes: Pink conjunctiva, anicteric sclera. HEENT: Normocephalic, moist mucous membranes. Lungs: No audible wheezing or coughing. Heart: Regular rate and rhythm. Abdomen: Soft, nontender, no obvious distention. Musculoskeletal: 2-3+ lower extremity edema. Neuro: Alert, answering all questions appropriately. Cranial nerves grossly intact. Skin: No rashes or petechiae noted. Psych: Normal affect.   LAB RESULTS:  Lab Results  Component Value Date   NA 138 04/19/2019   K 4.6 04/19/2019   CL 109 04/19/2019   CO2 23 04/19/2019   GLUCOSE 94 04/19/2019   BUN 19 04/19/2019   CREATININE 1.38 (H) 04/19/2019   CALCIUM 8.6 (L) 04/19/2019   PROT 7.6 04/05/2019   ALBUMIN 2.7 (L) 04/05/2019   AST 31 04/05/2019   ALT 19 04/05/2019   ALKPHOS 206 (H) 04/05/2019   BILITOT 1.7 (H) 04/05/2019   GFRNONAA 52 (L) 04/19/2019   GFRAA >60 04/19/2019    Lab Results  Component Value Date   WBC 4.7 04/19/2019   NEUTROABS 2.2 04/10/2019   HGB 9.3 (L) 04/19/2019   HCT 29.7 (L) 04/19/2019   MCV 112.5 (H) 04/19/2019   PLT 108 (L) 04/19/2019   Lab Results  Component Value Date    TOTALPROTELP 8.5 04/19/2019   ALBUMINELP 3.3 04/19/2019   A1GS 0.3 04/19/2019   A2GS 0.6 04/19/2019   BETS 1.1 04/19/2019   GAMS 3.3 (H) 04/19/2019   MSPIKE 2.5 (H) 04/19/2019   SPEI Comment 04/19/2019      STUDIES: US Venous Img Lower Unilateral Left  Result Date: 04/05/2019 CLINICAL DATA:  Redness and warmth of the left leg, elevated serum creatinine and odontoid DA here trending figure out of with scarred on this EXAM: LEFT LOWER EXTREMITY VENOUS DOPPLER ULTRASOUND TECHNIQUE: Gray-scale sonography with compression, as well as color and duplex ultrasound, were performed to evaluate the deep venous system(s) from the level of the common femoral vein through the popliteal and proximal calf veins. COMPARISON:  None.  FINDINGS: VENOUS Normal compressibility of the common femoral, superficial femoral, and popliteal veins, as well as the visualized calf veins. Visualized portions of profunda femoral vein and great saphenous vein unremarkable. No filling defects to suggest DVT on grayscale or color Doppler imaging. Doppler waveforms show normal direction of venous flow, normal respiratory phasicity and response to augmentation. Limited views of the contralateral common femoral vein are unremarkable. OTHER There are multiple ovoid hypoechoic lesions in the subdermal fat in the region of indicated biopsy approximately 4 months prior. These do not conform to the typical appearance of lymph nodes given and almost anechoic center with a peripheral rim of intermediate attenuation. These nodules are avascular and do not appear to reflect a superficial vein. While there is surrounding edema more diffuse soft tissue edema is noted elsewhere in the length as well. Limitations: none IMPRESSION: No femoropopliteal DVT nor evidence of DVT within the visualized calf veins. If clinical symptoms are inconsistent or if there are persistent or worsening symptoms, further imaging (possibly involving the iliac veins) may be  warranted. Several small hypoechoic nodules within the subdermal fat in a region of patient's indicated area of erythema and near the site of recent biopsy. Overall appearance of these nodules is nonspecific. Finding could reflect nodes benign skin lesion such as dermal inclusion cysts or less likely a pilomatrixoma. However more insidious lesions are not fully excluded. Recommend first correlating with patient's biopsy results of this localized area. Could then consider short-term interval follow-up ultrasound to assess for change versus contrast-enhanced MRI on an outpatient basis. Electronically Signed   By: Lovena Le M.D.   On: 04/05/2019 00:25   DG Chest Portable 1 View  Result Date: 04/04/2019 CLINICAL DATA:  Dizziness, reported fever, no accompany symptoms, life vest battery is low EXAM: PORTABLE CHEST 1 VIEW COMPARISON:  Radiograph 12/23/2012 FINDINGS: Diffusely increased hazy interstitial opacities with cephalized, indistinct vascularity and some fissural thickening suggesting at least mild pulmonary edema with cardiomegaly similar to slightly increased from prior though possibly accentuated by the portable technique. The aorta is calcified. The remaining cardiomediastinal contours are unremarkable. No visible pneumothorax or effusion although portions of the left costophrenic sulcus are collimated from view. No acute osseous or soft tissue abnormality. Degenerative changes are present in the imaged spine and shoulders. IMPRESSION: Findings suggestive of at least mild pulmonary edema with cardiomegaly similar to slightly increased from prior. Electronically Signed   By: Lovena Le M.D.   On: 04/04/2019 22:43    ASSESSMENT: Smoldering myeloma  PLAN:    1.  Smoldering myeloma: Bone marrow biopsy completed at [facility] on February 14, 2019 revealed approximately 26% plasma cells in aspirate.  FISH and cytogenetics are pending.  Metastatic bone survey on January 09, 2019 did not reveal any suspicious  bony lesions. His most recent M spike and IgG component are elevated, but essentially unchanged at 2.5 and 3999 respectively.  His kappa free light chains have trended down slightly to 864.1.  He continues to have thrombocytopenia with a platelet count of 108 and anemia with a hemoglobin of 9.3.  Can consider PET scan in the near future to look for occult lesions otherwise, no intervention is needed at this time.  Patient expressed understanding that he likely will require treatment in the near future, but would not initiate treatment with his active cardiac issues.  Return to clinic in 3 months with repeat laboratory work and further evaluation. 2.  Thrombocytopenia: Platelets have trended down slightly to 108.  Patient noted  to have a decreased platelet count since at least July 2017. 3.  Hyperbilirubinemia: Patient's most recent laboratory work at an outside facility revealed a bilirubin of 1.8.  Unclear etiology.  Continue to monitor. 4.  Anemia: Hemoglobin is trended down significantly to 9.3, monitor. 5.  Renal insufficiency: Chronic and unchanged.  Patient's creatinine is 1.38. 6.  Cellulitis: Continue antibiotics as prescribed.   7.  Cardiac disease: Continue follow-up and treatment per cardiology.    Patient expressed understanding and was in agreement with this plan. He also understands that He can call clinic at any time with any questions, concerns, or complaints.   Cancer Staging No matching staging information was found for the patient.  Lloyd Huger, MD   04/26/2019 3:17 PM

## 2019-04-22 NOTE — Progress Notes (Signed)
Cardiology Office Note  Date:  04/24/2019   ID:  [name], [name] [birth year], MRN [id number]  PCP:  Albina Billet, MD   Chief Complaint  Patient presents with  . office visit    Pt's wife has a list of concerns (Neurologist suggested to take Gabapentin 2 in the morning and 2 in the evening, instead 4 times daily). Meds verbally reviewed w/ pt.    HPI:  [name] is a 69 year old gentleman with past medical history of Nonischemic cardiomyopathy Chronic systolic CHF CKD Cirrhosis, NASH, etoh Atrial fibrillation, s/p DCCV 02/16/19 LHC 02/15/19 demonstrated 60% ostial D2 stenosis. He presents  for his nonischemic cardiomyopathy  Seen by myself, 03/28/19, at that time we continued Lasix 40 daily with 40 in the afternoon 3 days a week given continued lower extremity edema  In the hospital 04/04/19, with fever 101 erythematous and warm left lower extremity that is consistent with cellulitis.  treated with keflex elevated ammonia of 82, likely hepatic encephalopathy.    History of EtOH, NASH At d/c , diuretics were held, [place] held (presumably for elevated creatinine)  Seen by PA in our office 04/10/19, labs done, creatinine improved Was not restarted on diuretic Since then has had massive fluid retention, abdominal distention, leg edema, up 20 pounds on today's visit, very uncomfortable  Cardiac MRI done at [facility] No CMR evidence of infiltrative cardiomyopathy. No myocardial scarring or LGE. Hypokinetic left ventricle with visually estimated ejection fraction between 30-40%.  Still wearing the vest Has appt with Dr. Caryl Comes next week  Has had dramatic weight gain as detailed above Weight on my visit 227  March 28 2019, felt to be close to a euvolemic state at that time Weight today 244 Likely 20 pounds over dry weight Has not been on Lasix or Entresto for several weeks  EKG personally reviewed by myself on todays visit Normal sinus rhythm rate 90 bpm PACs, PVCs  Other past  medical history reviewed Prior admission at [facility] January 2021 for decompensated heart failure, acute on chronic systolic CHF Echocardiogram confirm ejection fraction 25 to 30% moderate mitral valve regurgitation reduced RV function and severe TR  Left heart catheterization with nonobstructive disease  Transition to [facility], Had a cardioversion for atrial fibrillation normal sinus rhythm restored Notes indicating prior longstanding atrial fibrillation Maintained on Eliquis Digoxin discontinued Following cardioversion initially with junctional bradycardia, slow return of sinus node function  Baseline weight appears 213 pounds, discharged on Lasix 40 daily Discharged with a LifeVest given nonsustained VT during admission, was referred to EP  On last clinic visit March 28, 2019, had  life vest in place   Weight was 280 two years ago, poor appetite Weight 250 summer of 2020 Was not eating for long period  Weight 217 at home on last clinic visit D/c from hospital 213 Weight in our office last clinic visit 227 pounds, had LifeVest and shoes on He did have some lower extremity edema  Total bili chronically elevated, "fatty liver dz" 1.8  PMH:   has a past medical history of AF (atrial fibrillation) ([place]), Cardiomyopathy ([place]), CHF (congestive heart failure) ([place]), Diabetes mellitus without complication ([place]), Fatty liver, H/O ETOH abuse, adenomatous polyp of colon, thrombocytopenia, Hypertension, Serum lipids high, and Thyroid disease.  PSH:    Past Surgical History:  Procedure Laterality Date  . CARDIAC CATHETERIZATION     no stents  . COLONOSCOPY    . COLONOSCOPY WITH PROPOFOL N/A 07/26/2015   Procedure: COLONOSCOPY WITH PROPOFOL;  Surgeon: Manya Silvas, MD;  Location: [facility] ENDOSCOPY;  Service: Endoscopy;  Laterality: N/A;  . HAMMER TOE SURGERY      Current Outpatient Medications  Medication Sig Dispense Refill  . apixaban (ELIQUIS) 5 MG TABS tablet Take by mouth 2 (two) times  daily.     [name] atorvastatin (LIPITOR) 10 MG tablet Take 10 mg by mouth daily.    . cephALEXin (KEFLEX) 500 MG capsule Take 500 mg by mouth 2 (two) times daily. Taking 2 times daily    . clobetasol cream (TEMOVATE) 5.64 % Apply 1 application topically 2 (two) times daily.    [name] gabapentin (NEURONTIN) 100 MG capsule Take 100 mg by mouth 4 (four) times daily.    [name] lactobacillus acidophilus (BACID) TABS tablet Take 2 tablets by mouth 3 (three) times daily. 30 tablet 0  . lactulose (CHRONULAC) 10 GM/15ML solution Take 45 mLs (30 g total) by mouth 2 (two) times daily. 2700 mL 0  . levothyroxine (SYNTHROID, LEVOTHROID) 100 MCG tablet Take 100 mcg by mouth every morning.  4  . metoprolol succinate (TOPROL-XL) 25 MG 24 hr tablet 25 mg daily.     . pantoprazole (PROTONIX) 20 MG tablet Take 20 mg by mouth daily.     . potassium chloride SA (KLOR-CON) 20 MEQ tablet Take 1 tablet (20 mEq total) by mouth as directed. Take 1 tablet (20 mEq) twice a day for 5 days then go to 1 tablet (20 mEq) once daily 180 tablet 3  . sacubitril-valsartan (ENTRESTO) 24-26 MG Take 1 tablet by mouth 2 (two) times daily. 60 tablet 11  . torsemide (DEMADEX) 20 MG tablet Take 1 tablet (20 mg total) by mouth as directed. Take 2 tablets (40 mg) twice a day for 5 days then go to 1 tablet (20 mg) twice a day 180 tablet 3   No current facility-administered medications for this visit.    Allergies:   Penicillins   Social History:  The patient  reports that he has quit smoking. His smokeless tobacco use includes snuff. He reports that he does not drink alcohol or use drugs.   Family History:   family history includes Heart Problems in his father, mother, and sister.    Review of Systems: Review of Systems  Constitutional: Positive for malaise/fatigue.  HENT: Negative.   Respiratory: Positive for shortness of breath.   Cardiovascular: Positive for leg swelling.       Massive abdominal distention  Gastrointestinal: Negative.    Musculoskeletal: Negative.   Neurological: Negative.   Psychiatric/Behavioral: Negative.   All other systems reviewed and are negative.   PHYSICAL EXAM: VS:  BP 128/66 (BP Location: Left Arm, Patient Position: Sitting, Cuff Size: Normal)   Pulse 90   Ht 6' 2"  (1.88 m)   Wt 244 lb (110.7 kg)   SpO2 98%   BMI 31.33 kg/m  , BMI Body mass index is 31.33 kg/m. GEN: Well nourished, well developed, in no acute distress HEENT: normal Neck:  JVD 10+, carotid bruits, or masses Cardiac: RRR; no murmurs, rubs, or gallops,  2+ edema to below the knees Respiratory: Dullness at the bases, scattered Rales GI: Distended, tight MS: no deformity or atrophy Skin: warm and dry, no rash Neuro:  Strength and sensation are intact Psych: euthymic mood, full affect   Recent Labs: 04/05/2019: ALT 19; TSH 2.342 04/10/2019: Magnesium 2.4 04/19/2019: BUN 19; Creatinine, Ser 1.38; Hemoglobin 9.3; Platelets 108; Potassium 4.6; Sodium 138    Lipid Panel No results found for:  CHOL, HDL, LDLCALC, TRIG    Wt Readings from Last 3 Encounters:  04/24/19 244 lb (110.7 kg)  04/10/19 235 lb (106.6 kg)  04/04/19 207 lb (93.9 kg)     ASSESSMENT AND PLAN:  Problem List Items Addressed This Visit      Cardiology Problems   Atrial fibrillation (HCC)   Relevant Medications   sacubitril-valsartan (ENTRESTO) 24-26 MG   torsemide (DEMADEX) 20 MG tablet   Other Relevant Orders   EKG 12-Lead   Comp Met (CMET)    Other Visit Diagnoses    Nonischemic cardiomyopathy ([place])    -  Primary   Relevant Medications   sacubitril-valsartan (ENTRESTO) 24-26 MG   torsemide (DEMADEX) 20 MG tablet   Other Relevant Orders   Comp Met (CMET)   Chronic systolic CHF (congestive heart failure) (HCC)       Relevant Medications   sacubitril-valsartan (ENTRESTO) 24-26 MG   torsemide (DEMADEX) 20 MG tablet   Hyperlipidemia LDL goal <70       Relevant Medications   sacubitril-valsartan (ENTRESTO) 24-26 MG   torsemide  (DEMADEX) 20 MG tablet   Coronary artery disease of native artery of native heart with stable angina pectoris (HCC)       Relevant Medications   sacubitril-valsartan (ENTRESTO) 24-26 MG   torsemide (DEMADEX) 20 MG tablet     Nonischemic cardiomyopathy etiology unclear, recent MRI reviewed with him, no infiltrative disease, ejection fraction up to 30 to 40%  metoprolol Massive fluid retention since his last clinic visit after diuretics and Entresto held following discharge from the hospital for cellulitis --- We will start torsemide 40 twice daily for 5 days potassium 20 twice daily  then torsemide 20 twice daily potassium 20 daily -Restart Entresto CMP in 1 week time Continue to wear the vest until he sees Dr. Caryl Comes, EP  Atrial fibrillation, persistent Appears to be maintaining normal sinus rhythm, frequent ectopy On Eliquis, metoprolol  Chronic systolic CHF Weight 494 pounds, Was 224 pounds on his last clinic visit with me Goal weight at home 217 pounds Plan as above   Disposition:   F/U  1 months   Total encounter time more than 45 minutes  Greater than 50% was spent in counseling and coordination of care with the patient    Signed, Esmond Plants, M.D., Ph.D. [place], [place]

## 2019-04-23 DIAGNOSIS — I420 Dilated cardiomyopathy: Secondary | ICD-10-CM | POA: Diagnosis not present

## 2019-04-24 ENCOUNTER — Ambulatory Visit (INDEPENDENT_AMBULATORY_CARE_PROVIDER_SITE_OTHER): Payer: PPO | Admitting: Cardiovascular Disease

## 2019-04-24 ENCOUNTER — Encounter: Payer: Self-pay | Admitting: Cardiovascular Disease

## 2019-04-24 ENCOUNTER — Other Ambulatory Visit: Payer: Self-pay

## 2019-04-24 VITALS — BP 128/66 | HR 90 | Ht 74.0 in | Wt 244.0 lb

## 2019-04-24 DIAGNOSIS — E039 Hypothyroidism, unspecified: Secondary | ICD-10-CM

## 2019-04-24 DIAGNOSIS — L0390 Cellulitis, unspecified: Secondary | ICD-10-CM

## 2019-04-24 DIAGNOSIS — I480 Paroxysmal atrial fibrillation: Secondary | ICD-10-CM | POA: Diagnosis not present

## 2019-04-24 DIAGNOSIS — I5022 Chronic systolic (congestive) heart failure: Secondary | ICD-10-CM | POA: Diagnosis not present

## 2019-04-24 DIAGNOSIS — I428 Other cardiomyopathies: Secondary | ICD-10-CM

## 2019-04-24 DIAGNOSIS — I25118 Atherosclerotic heart disease of native coronary artery with other forms of angina pectoris: Secondary | ICD-10-CM

## 2019-04-24 DIAGNOSIS — N179 Acute kidney failure, unspecified: Secondary | ICD-10-CM

## 2019-04-24 DIAGNOSIS — E785 Hyperlipidemia, unspecified: Secondary | ICD-10-CM

## 2019-04-24 MED ORDER — ENTRESTO 24-26 MG PO TABS: 1.0000 | ORAL_TABLET | Freq: Two times a day (BID) | ORAL | 11 refills | Status: DC

## 2019-04-24 MED ORDER — POTASSIUM CHLORIDE CRYS ER 20 MEQ PO TBCR: 20.0000 meq | EXTENDED_RELEASE_TABLET | ORAL | 3 refills | Status: DC

## 2019-04-24 MED ORDER — TORSEMIDE 20 MG PO TABS: 20.0000 mg | ORAL_TABLET | ORAL | 3 refills | Status: DC

## 2019-04-24 NOTE — Patient Instructions (Addendum)
Medication Instructions:  Your physician has recommended you make the following change in your medication:   1. RESTART Entresto 24/26 mg twice a day 2. START Torsemide 20 mg take 2 tablets (40 mg) twice a day for 5 days THEN 3. THEN Take Torsemide 20 mg twice a day 3. START Potassium 20 mEq twice a day for 5 days THEN 4. THEN take Potassium 20 mEq once daily  Goal weight at home 220 Current weight 244  CMP on appt with Dr. Caryl Comes   If you need a refill on your cardiac medications before your next appointment, please call your pharmacy.    Lab work: CMP needed when he comes in to see Dr. Caryl Comes   If you have labs (blood work) drawn today and your tests are completely normal, you will receive your results only by: [name] MyChart Message (if you have MyChart) OR . A paper copy in the mail If you have any lab test that is abnormal or we need to change your treatment, we will call you to review the results.   Testing/Procedures: No new testing needed   Follow-Up: At [facility], you and your health needs are our priority.  As part of our continuing mission to provide you with exceptional heart care, we have created designated Provider Care Teams.  These Care Teams include your primary Cardiologist (physician) and Advanced Practice Providers (APPs -  Physician Assistants and Nurse Practitioners) who all work together to provide you with the care you need, when you need it.  . You will need a follow up appointment in 1 month with Alyssamae Klinck   . Providers on your designated Care Team:   . Murray Hodgkins, NP . Christell Faith, PA-C . Marrianne Mood, PA-C  Any Other Special Instructions Will Be Listed Below (If Applicable).  For educational health videos Log in to : [email or web address] Or : [email or web address], password : [email or web address]

## 2019-04-26 ENCOUNTER — Inpatient Hospital Stay: Payer: PPO | Attending: Oncology | Admitting: Oncology

## 2019-04-26 ENCOUNTER — Other Ambulatory Visit: Payer: Self-pay

## 2019-04-26 ENCOUNTER — Encounter: Payer: Self-pay | Admitting: Oncology

## 2019-04-26 VITALS — BP 91/52 | HR 70 | Temp 94.5°F | Resp 18 | Wt 221.7 lb

## 2019-04-26 DIAGNOSIS — I509 Heart failure, unspecified: Secondary | ICD-10-CM | POA: Insufficient documentation

## 2019-04-26 DIAGNOSIS — I110 Hypertensive heart disease with heart failure: Secondary | ICD-10-CM | POA: Diagnosis not present

## 2019-04-26 DIAGNOSIS — D649 Anemia, unspecified: Secondary | ICD-10-CM | POA: Diagnosis not present

## 2019-04-26 DIAGNOSIS — Z7901 Long term (current) use of anticoagulants: Secondary | ICD-10-CM | POA: Diagnosis not present

## 2019-04-26 DIAGNOSIS — Z79899 Other long term (current) drug therapy: Secondary | ICD-10-CM | POA: Diagnosis not present

## 2019-04-26 DIAGNOSIS — E079 Disorder of thyroid, unspecified: Secondary | ICD-10-CM | POA: Insufficient documentation

## 2019-04-26 DIAGNOSIS — C9000 Multiple myeloma not having achieved remission: Secondary | ICD-10-CM | POA: Diagnosis not present

## 2019-04-26 DIAGNOSIS — D472 Monoclonal gammopathy: Secondary | ICD-10-CM

## 2019-04-26 DIAGNOSIS — E119 Type 2 diabetes mellitus without complications: Secondary | ICD-10-CM | POA: Insufficient documentation

## 2019-04-26 DIAGNOSIS — I4891 Unspecified atrial fibrillation: Secondary | ICD-10-CM | POA: Diagnosis not present

## 2019-04-26 DIAGNOSIS — Z87891 Personal history of nicotine dependence: Secondary | ICD-10-CM | POA: Diagnosis not present

## 2019-04-26 DIAGNOSIS — D696 Thrombocytopenia, unspecified: Secondary | ICD-10-CM | POA: Diagnosis not present

## 2019-04-26 DIAGNOSIS — N289 Disorder of kidney and ureter, unspecified: Secondary | ICD-10-CM | POA: Insufficient documentation

## 2019-04-26 NOTE — Progress Notes (Signed)
Pt retaining fluid in legs, but wife reports that it's better than it has been. Currently has a life vest on, may need defibrillator or pacemaker; meets with surgeon on Monday. No complaints of pain.

## 2019-05-01 NOTE — Progress Notes (Signed)
ELECTROPHYSIOLOGY CONSULT NOTE  Patient ID: [name], MRN: [id number], DOB/AGE: 69-Sep-1952 69 y.o. Admit date: (Not on file) Date of Consult: 05/02/2019  Primary Physician: [name] Billet, MD Primary Cardiologist [name]     RYNE [name] is a 69 y.o. male who is being seen today for the evaluation of ICD at the request of [name].    HPI [name] is a 69 y.o. male referred for opinions regarding an ICD and atrial fib   Presented 1/21 [facility] [place] with acute on chronic systolic heart failure; found to be in atrial fibrillation to which, in conjunction with remote alcohol, the cardiomyopathy was ascribed.  Also noted to have nonsustained ventricular tachycardia.  Underwent TEE cardioversion complicated by hypotension and bradycardia prompting discontinuation of digoxin.  Medications were changed from an ARB--Entresto.  Discharged with a [place].  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  Lower extremity cellulitis  Febrile; ECG read as atrial fibrillation but upon my review I feel like it sinus rhythm with frequent PACs (see below)..  Hepatic encephalopathy.  Hypotensive and Entresto discontinued as well as diuretic.  Interval significant weight gain. Diuretics resumed by Dr. Deidre Ala  l  DATE TEST EF   4/20 (scanned)   Echo   30 %   1/21 (scanned)   Echo  25-30% Severe TR  1/21 (scanned)   LHC    % D2 60%  3/21 (scanned)   cMRI 30-40%  LGE neg    Date Cr K Hgb  1/21 1.52 4.23 11.7         Thromboembolic risk factors ( age -50, CHF-1) for a CHADSVASc Score of 2  Orthostatic lightheadedness; no syncope   Past Medical History:  Diagnosis Date  . AF (atrial fibrillation) ([place])   . Cardiomyopathy ([place])   . CHF (congestive heart failure) ([place])   .  Diabetes mellitus without complication ([place])   . Fatty liver   . H/O ETOH abuse   . Hx of adenomatous polyp of colon   . Hx of thrombocytopenia   . Hypertension   . Serum lipids high   . Thyroid disease       Surgical History:  Past Surgical History:  Procedure Laterality Date  . CARDIAC CATHETERIZATION     no stents  . COLONOSCOPY    . COLONOSCOPY WITH PROPOFOL N/A 07/26/2015   Procedure: COLONOSCOPY WITH PROPOFOL;  Surgeon: Manya Silvas, MD;  Location: [facility] ENDOSCOPY;  Service: Endoscopy;  Laterality: N/A;  . HAMMER TOE SURGERY       Home Meds: Current Meds  Medication Sig  . apixaban (ELIQUIS) 5 MG TABS tablet Take by mouth 2 (two) times daily.   [name] atorvastatin (LIPITOR) 10 MG tablet Take 10 mg by mouth daily.  . cephALEXin (KEFLEX) 500 MG capsule Take 500 mg by mouth 2 (two) times daily. Taking 2 times daily  . clobetasol cream (TEMOVATE) 3.32 % Apply 1 application topically 2 (two) times daily.  [name] gabapentin (NEURONTIN) 100 MG capsule Take 100 mg by mouth 4 (four) times daily.  [name] lactobacillus acidophilus (BACID) TABS tablet Take 2 tablets by mouth 3 (three) times daily.  [name] lactulose (CHRONULAC) 10 GM/15ML solution Take 45 mLs (30 g total) by mouth 2 (two) times daily.  [name] levothyroxine (SYNTHROID, LEVOTHROID)  100 MCG tablet Take 100 mcg by mouth every morning.  . metoprolol succinate (TOPROL-XL) 25 MG 24 hr tablet 25 mg daily.   . pantoprazole (PROTONIX) 20 MG tablet Take 20 mg by mouth daily.   . potassium chloride SA (KLOR-CON) 20 MEQ tablet Take 1 tablet (20 mEq total) by mouth as directed. Take 1 tablet (20 mEq) twice a day for 5 days then go to 1 tablet (20 mEq) once daily  . sacubitril-valsartan (ENTRESTO) 24-26 MG Take 1 tablet by mouth 2 (two) times daily.  [name] torsemide (DEMADEX) 20 MG tablet Take 1 tablet (20 mg total) by mouth as directed. Take 2 tablets (40 mg) twice a day for 5 days then go to 1 tablet (20 mg) twice a day    Allergies:  Allergies  Allergen  Reactions  . Penicillins Hives    Social History   Socioeconomic History  . Marital status: Widowed    Spouse name: Not on file  . Number of children: Not on file  . Years of education: Not on file  . Highest education level: Not on file  Occupational History  . Not on file  Tobacco Use  . Smoking status: Former Research scientist (life sciences)  . Smokeless tobacco: Current User    Types: Snuff  Substance and Sexual Activity  . Alcohol use: No  . Drug use: No  . Sexual activity: Not on file  Other Topics Concern  . Not on file  Social History Narrative  . Not on file   Social Determinants of Health   Financial Resource Strain:   . Difficulty of Paying Living Expenses:   Food Insecurity:   . Worried About Charity fundraiser in the Last Year:   . Arboriculturist in the Last Year:   Transportation Needs:   . Film/video editor (Medical):   [name] Lack of Transportation (Non-Medical):   Physical Activity:   . Days of Exercise per Week:   . Minutes of Exercise per Session:   Stress:   . Feeling of Stress :   Social Connections:   . Frequency of Communication with Friends and Family:   . Frequency of Social Gatherings with Friends and Family:   . Attends Religious Services:   . Active Member of Clubs or Organizations:   . Attends Archivist Meetings:   [name] Marital Status:   Intimate Partner Violence:   . Fear of Current or Ex-Partner:   . Emotionally Abused:   [name] Physically Abused:   . Sexually Abused:      Family History  Problem Relation Age of Onset  . Heart Problems Mother   . Heart Problems Father   . Heart Problems Sister      ROS:  Please see the history of present illness.     All other systems reviewed and negative.    Physical Exam  Blood pressure (!) 98/54, pulse 80, height 6' 2"  (1.88 m), weight 204 lb (92.5 kg). General: Well developed, well nourished male in no acute distress. Head: Normocephalic, atraumatic, sclera non-icteric, no xanthomas, nares are  without discharge. EENT: normal  Lymph Nodes:  none Neck: Negative for carotid bruits. JVD not elevated. Back:without scoliosis kyphosis Lungs: Clear bilaterally to auscultation without wheezes, rales, or rhonchi. Breathing is unlabored. Heart: RRR with S1 S2. No * murmur . No rubs, or gallops appreciated. Abdomen: Soft, non-tender, non-distended with normoactive bowel sounds. No hepatomegaly. No rebound/guarding. No obvious abdominal masses. Msk:  Strength and tone appear normal  for age. Extremities: No clubbing or cyanosis. tr edema. Lower extremities are wrapped distal pedal pulses are 2+ and equal bilaterally. Skin: Warm and Dry Neuro: Alert and oriented X 3. CN III-XII intact Grossly normal sensory and motor function . Psych:  Responds to questions appropriately with a normal affect.      Labs: Cardiac Enzymes No results for input(s): CKTOTAL, CKMB, TROPONINI in the last 72 hours. CBC Lab Results  Component Value Date   WBC 4.7 04/19/2019   HGB 9.3 (L) 04/19/2019   HCT 29.7 (L) 04/19/2019   MCV 112.5 (H) 04/19/2019   PLT 108 (L) 04/19/2019   PROTIME: No results for input(s): LABPROT, INR in the last 72 hours. Chemistry No results for input(s): NA, K, CL, CO2, BUN, CREATININE, CALCIUM, PROT, BILITOT, ALKPHOS, ALT, AST, GLUCOSE in the last 168 hours.  Invalid input(s): LABALBU Lipids No results found for: CHOL, HDL, LDLCALC, TRIG BNP No results found for: PROBNP Thyroid Function Tests: No results for input(s): TSH, T4TOTAL, T3FREE, THYROIDAB in the last 72 hours.  Invalid input(s): FREET3 Miscellaneous No results found for: DDIMER  Radiology/Studies:  US Venous Img Lower Unilateral Left  Result Date: 04/05/2019 CLINICAL DATA:  Redness and warmth of the left leg, elevated serum creatinine and odontoid DA here trending figure out of with scarred on this EXAM: LEFT LOWER EXTREMITY VENOUS DOPPLER ULTRASOUND TECHNIQUE: Gray-scale sonography with compression, as well as  color and duplex ultrasound, were performed to evaluate the deep venous system(s) from the level of the common femoral vein through the popliteal and proximal calf veins. COMPARISON:  None. FINDINGS: VENOUS Normal compressibility of the common femoral, superficial femoral, and popliteal veins, as well as the visualized calf veins. Visualized portions of profunda femoral vein and great saphenous vein unremarkable. No filling defects to suggest DVT on grayscale or color Doppler imaging. Doppler waveforms show normal direction of venous flow, normal respiratory phasicity and response to augmentation. Limited views of the contralateral common femoral vein are unremarkable. OTHER There are multiple ovoid hypoechoic lesions in the subdermal fat in the region of indicated biopsy approximately 4 months prior. These do not conform to the typical appearance of lymph nodes given and almost anechoic center with a peripheral rim of intermediate attenuation. These nodules are avascular and do not appear to reflect a superficial vein. While there is surrounding edema more diffuse soft tissue edema is noted elsewhere in the length as well. Limitations: none IMPRESSION: No femoropopliteal DVT nor evidence of DVT within the visualized calf veins. If clinical symptoms are inconsistent or if there are persistent or worsening symptoms, further imaging (possibly involving the iliac veins) may be warranted. Several small hypoechoic nodules within the subdermal fat in a region of patient's indicated area of erythema and near the site of recent biopsy. Overall appearance of these nodules is nonspecific. Finding could reflect nodes benign skin lesion such as dermal inclusion cysts or less likely a pilomatrixoma. However more insidious lesions are not fully excluded. Recommend first correlating with patient's biopsy results of this localized area. Could then consider short-term interval follow-up ultrasound to assess for change versus  contrast-enhanced MRI on an outpatient basis. Electronically Signed   By: Lovena Le M.D.   On: 04/05/2019 00:25   DG Chest Portable 1 View  Result Date: 04/04/2019 CLINICAL DATA:  Dizziness, reported fever, no accompany symptoms, life vest battery is low EXAM: PORTABLE CHEST 1 VIEW COMPARISON:  Radiograph 12/23/2012 FINDINGS: Diffusely increased hazy interstitial opacities with cephalized, indistinct vascularity and some  fissural thickening suggesting at least mild pulmonary edema with cardiomegaly similar to slightly increased from prior though possibly accentuated by the portable technique. The aorta is calcified. The remaining cardiomediastinal contours are unremarkable. No visible pneumothorax or effusion although portions of the left costophrenic sulcus are collimated from view. No acute osseous or soft tissue abnormality. Degenerative changes are present in the imaged spine and shoulders. IMPRESSION: Findings suggestive of at least mild pulmonary edema with cardiomegaly similar to slightly increased from prior. Electronically Signed   By: Lovena Le M.D.   On: 04/04/2019 22:43    EKG: Sinus at 80 with frequent and complex atrial and ventricular ectopy.   PVCs with a right bundle morphology as well as a left bundle morphology   Assessment and Plan:  Nonischemic cardiomyopathy  Congestive heart failure-chronic-systolic class    Atrial fibrillation-persistent  Hepatitis question cirrhosis  Smoldering myeloma  Renal insufficiency grade 3  Ventricular tachycardia-nonsustained  Hypotension  Orthostatic lightheadedness    The patient story is really very complicated. He presented 1/21 with congestive heart failure in the setting of atrial fibrillation for which he underwent TEE guided cardioversion. He has not had atrial fibrillation since then despite the ECG recording from 3/21. He is scheduled for catheter ablation at [facility] 5/21. Given the burden of atrial ectopy and his  metabolic limitations related to his kidney and liver disease, it is a not unreasonable undertaking although I would wonder whether the atrial fibrillation was not secondary to the heart failure and allowing it to declare itself again would not be unreasonable. However it is also reasonable thing to undertake catheter ablation as a primary intervention  As relates to the issue of an ICD, I would not be inclined to undertake permanent ICD implantation. His MRI scan is read as 30 40%, hence his ejection fraction estimate is right on the borderline for even consideration. Given his age, and the [place] trial would be less inclined. Given his comorbidities with his myeloma and his liver issues would be increasingly disinclined. Nonsustained ventricular tachycardia does not portend an increased risk of arrhythmic death and nonischemic cardiomyopathy patients, although it is associate with her overall risk and sudden death.  In this regard, I would also favor not using his LifeVest.  I have told him that these opinions are distinct from what we have given him to [facility]. And he will have to decide which approach he would choose to pursue.  As relates to his cardiomyopathy, I wonder whether he is better off on an ARB and spironolactone as opposed to Entresto and no spironolactone. The incremental benefit of the spironolactone in my mind is greater than the incremental benefit of the sacubitril. Will defer this to Dr. Deidre Ala. I would also be inclined towards digoxin; however, if he is going to have catheter ablation I am not sure is worth the logistical challenges in getting it started and getting a monitor given his remote domicile.  We also spoke about the potential benefits of compressive wear i.e. thigh sleeves and abdominal binders given his orthostatic lightheadedness.   Virl Axe

## 2019-05-02 ENCOUNTER — Other Ambulatory Visit
Admission: RE | Admit: 2019-05-02 | Discharge: 2019-05-02 | Disposition: A | Discharge status: Home / Self Care | Payer: PPO | Source: Ambulatory Visit | Attending: Cardiovascular Disease | Admitting: Cardiovascular Disease

## 2019-05-02 ENCOUNTER — Other Ambulatory Visit: Payer: PPO

## 2019-05-02 ENCOUNTER — Other Ambulatory Visit: Payer: Self-pay

## 2019-05-02 ENCOUNTER — Ambulatory Visit (INDEPENDENT_AMBULATORY_CARE_PROVIDER_SITE_OTHER): Payer: PPO | Admitting: Internal Medicine

## 2019-05-02 ENCOUNTER — Encounter: Payer: Self-pay | Admitting: Internal Medicine

## 2019-05-02 VITALS — BP 98/54 | HR 80 | Ht 74.0 in | Wt 204.0 lb

## 2019-05-02 DIAGNOSIS — I428 Other cardiomyopathies: Secondary | ICD-10-CM | POA: Diagnosis not present

## 2019-05-02 DIAGNOSIS — I480 Paroxysmal atrial fibrillation: Secondary | ICD-10-CM

## 2019-05-02 DIAGNOSIS — I5022 Chronic systolic (congestive) heart failure: Secondary | ICD-10-CM

## 2019-05-02 DIAGNOSIS — C9000 Multiple myeloma not having achieved remission: Secondary | ICD-10-CM | POA: Diagnosis not present

## 2019-05-02 DIAGNOSIS — L0291 Cutaneous abscess, unspecified: Secondary | ICD-10-CM | POA: Diagnosis not present

## 2019-05-02 DIAGNOSIS — L03116 Cellulitis of left lower limb: Secondary | ICD-10-CM | POA: Diagnosis not present

## 2019-05-02 LAB — COMPREHENSIVE METABOLIC PANEL
ALT: 18 U/L (ref 0–44)
AST: 36 U/L (ref 15–41)
Albumin: 3.2 g/dL — ABNORMAL LOW (ref 3.5–5.0)
Alkaline Phosphatase: 167 U/L — ABNORMAL HIGH (ref 38–126)
Anion gap: 8 (ref 5–15)
BUN: 39 mg/dL — ABNORMAL HIGH (ref 8–23)
CO2: 30 mmol/L (ref 22–32)
Calcium: 9.1 mg/dL (ref 8.9–10.3)
Chloride: 101 mmol/L (ref 98–111)
Creatinine, Ser: 1.91 mg/dL — ABNORMAL HIGH (ref 0.61–1.24)
GFR calc Af Amer: 41 mL/min — ABNORMAL LOW (ref >60)
GFR calc non Af Amer: 35 mL/min — ABNORMAL LOW (ref >60)
Glucose, Bld: 128 mg/dL — ABNORMAL HIGH (ref 70–99)
Potassium: 4 mmol/L (ref 3.5–5.1)
Sodium: 139 mmol/L (ref 135–145)
Total Bilirubin: 1.1 mg/dL (ref 0.3–1.2)
Total Protein: 9.3 g/dL — ABNORMAL HIGH (ref 6.5–8.1)

## 2019-05-02 NOTE — Patient Instructions (Signed)
Medication Instructions:  - Your physician recommends that you continue on your current medications as directed. Please refer to the Current Medication list given to you today.  *If you need a refill on your cardiac medications before your next appointment, please call your pharmacy*   Lab Work: - CMET per Dr. Rockey Situ  If you have labs (blood work) drawn today and your tests are completely normal, you will receive your results only by: [name] MyChart Message (if you have MyChart) OR . A paper copy in the mail If you have any lab test that is abnormal or we need to change your treatment, we will call you to review the results.   Testing/Procedures: - none ordered   Follow-Up: At [facility], you and your health needs are our priority.  As part of our continuing mission to provide you with exceptional heart care, we have created designated Provider Care Teams.  These Care Teams include your primary Cardiologist (physician) and Advanced Practice Providers (APPs -  Physician Assistants and Nurse Practitioners) who all work together to provide you with the care you need, when you need it.  We recommend signing up for the patient portal called "MyChart".  Sign up information is provided on this After Visit Summary.  MyChart is used to connect with patients for Virtual Visits (Telemedicine).  Patients are able to view lab/test results, encounter notes, upcoming appointments, etc.  Non-urgent messages can be sent to your provider as well.   To learn more about what you can do with MyChart, go to [email or web address].    Your next appointment:   As needed   The format for your next appointment:   n/a  Provider:   Virl Axe, MD   Other Instructions You are ok to stop wearing your LifeVest- you should have a return kit at home to send this back in.

## 2019-05-05 DIAGNOSIS — G250 Essential tremor: Secondary | ICD-10-CM | POA: Diagnosis not present

## 2019-05-05 DIAGNOSIS — R42 Dizziness and giddiness: Secondary | ICD-10-CM | POA: Diagnosis not present

## 2019-05-08 ENCOUNTER — Other Ambulatory Visit
Admission: RE | Admit: 2019-05-08 | Discharge: 2019-05-08 | Disposition: A | Discharge status: Home / Self Care | Payer: PPO | Source: Ambulatory Visit | Attending: Internal Medicine | Admitting: Internal Medicine

## 2019-05-08 ENCOUNTER — Telehealth: Payer: Self-pay | Admitting: Internal Medicine

## 2019-05-08 ENCOUNTER — Other Ambulatory Visit: Admission: RE | Admit: 2019-05-08 | Payer: PPO | Source: Ambulatory Visit

## 2019-05-08 DIAGNOSIS — I5022 Chronic systolic (congestive) heart failure: Secondary | ICD-10-CM | POA: Insufficient documentation

## 2019-05-08 LAB — BASIC METABOLIC PANEL
Anion gap: 11 (ref 5–15)
BUN: 51 mg/dL — ABNORMAL HIGH (ref 8–23)
CO2: 27 mmol/L (ref 22–32)
Calcium: 9.8 mg/dL (ref 8.9–10.3)
Chloride: 102 mmol/L (ref 98–111)
Creatinine, Ser: 1.85 mg/dL — ABNORMAL HIGH (ref 0.61–1.24)
GFR calc Af Amer: 42 mL/min — ABNORMAL LOW (ref >60)
GFR calc non Af Amer: 36 mL/min — ABNORMAL LOW (ref >60)
Glucose, Bld: 93 mg/dL (ref 70–99)
Potassium: 4.7 mmol/L (ref 3.5–5.1)
Sodium: 140 mmol/L (ref 135–145)

## 2019-05-08 MED ORDER — TORSEMIDE 20 MG PO TABS: 20.0000 mg | ORAL_TABLET | Freq: Every day | ORAL | 3 refills | Status: DC

## 2019-05-08 NOTE — Telephone Encounter (Signed)
Pt c/o BP issue: STAT if pt c/o blurred vision, one-sided weakness or slurred speech  1. What are your last 5 BP readings?  This morning 115/58 Yesterday  98/57 Saturday 107/58, 99/53  2. Are you having any other symptoms (ex. Dizziness, headache, blurred vision, passed out)?  Weight is down to 190  3. What is your BP issue? Low and losing weight.

## 2019-05-08 NOTE — Telephone Encounter (Signed)
Pt reports low blood pressure and intermittent dizziness for 1 week. Weight loss, now 190 lbs. Was 204 lbs at last OV.  This morning 115/58 Yesterday  98/57 Saturday 107/58, 99/53.  HR 50-60s. No SOB, no swelling, no chest pain.   Has ablation for a fib 05/25/19.  BP at last ov with Dr. Caryl Comes 98/54, HR 80.   Pt reports that he has been eating and drinking well. Is taking lactulose and is having frequent BMs. No complaints of fry mouth.

## 2019-05-08 NOTE — Telephone Encounter (Signed)
Incoming staff message received from Dr. Caryl Comes,  "[name] lets have him hold his demadex for two days and then resume at once a day thanks steve"  Call to patient to make him aware of POC from Dr. Caryl Comes.   Rx updated per request.   Pt agreeable to POC.   Advised pt to call for any further questions or concerns.

## 2019-05-09 ENCOUNTER — Telehealth: Payer: Self-pay | Admitting: *Deleted

## 2019-05-09 DIAGNOSIS — I480 Paroxysmal atrial fibrillation: Secondary | ICD-10-CM

## 2019-05-09 NOTE — Telephone Encounter (Signed)
Left voicemail message to call back in regards to patient and recommendations.

## 2019-05-09 NOTE — Telephone Encounter (Signed)
-----   Message from Minna Merritts, MD sent at 05/07/2019 12:32 PM EDT ----- Renal function higher Looks prerenal/intravascularly dry Likely from higher dose torsemide for 5 days Would suggest  he hold torsemide for 3 days then resume torsemide 20 BID with potassium daily Recheck BMP in 2 -3 weeks

## 2019-05-09 NOTE — Telephone Encounter (Signed)
Spoke with patient and reviewed results and recommendations. He wanted to write all of this down and after talking with him he then asked me was that the diarrhea medication. So then I spelled out the medication and instructions for him to write down and then extended offer to call his wife with this information as well. He was agreeable stating that she would be able to make sure he takes it correctly. He did verbalized understanding of our conversation with no other questions but did want me to call his wife. Will give her a call shortly to review this same information.

## 2019-05-10 ENCOUNTER — Encounter: Admission: RE | Payer: Self-pay | Source: Home / Self Care

## 2019-05-10 ENCOUNTER — Ambulatory Visit: Admission: RE | Admit: 2019-05-10 | Payer: PPO | Source: Home / Self Care | Admitting: Internal Medicine

## 2019-05-10 SURGERY — COLONOSCOPY WITH PROPOFOL
Anesthesia: General

## 2019-05-10 NOTE — Telephone Encounter (Signed)
Returned call to [employer]. It appears that two physicians were working on the same call. Instructions were called to patient on 4/19 per Dr. Caryl Comes. Pt had previously been taking torsemide 1 tablet (20 mg total) twice daily. Per labs kidneys were dry. Klein instructed patient take torsemide 1 tablet (20 mg total) once daily thereafter and pt was made aware of instructions.   Peggy calling today to confirm rx changes, per RN yesterday, Dr. Rockey Situ asked patient, "Would suggest  he hold torsemide for 3 days then resume torsemide 20 BID with potassium daily".  I told patient that since we are intending to decrease torsemide dose, we will continue POC to decrease torsemide to 1 tablet (20 mg total) once daily. Continue plan to have repeat labs in 2 weeks.   Advised pt to call for any further questions or concerns.

## 2019-05-10 NOTE — Telephone Encounter (Signed)
Patient gf  calling to discuss recent lab testing results   Please call

## 2019-05-12 DIAGNOSIS — L0291 Cutaneous abscess, unspecified: Secondary | ICD-10-CM | POA: Diagnosis not present

## 2019-05-12 DIAGNOSIS — L03116 Cellulitis of left lower limb: Secondary | ICD-10-CM | POA: Diagnosis not present

## 2019-05-12 DIAGNOSIS — C9000 Multiple myeloma not having achieved remission: Secondary | ICD-10-CM | POA: Diagnosis not present

## 2019-05-18 DIAGNOSIS — R229 Localized swelling, mass and lump, unspecified: Secondary | ICD-10-CM | POA: Diagnosis not present

## 2019-05-18 DIAGNOSIS — B9562 Methicillin resistant Staphylococcus aureus infection as the cause of diseases classified elsewhere: Secondary | ICD-10-CM | POA: Diagnosis not present

## 2019-05-18 DIAGNOSIS — L0889 Other specified local infections of the skin and subcutaneous tissue: Secondary | ICD-10-CM | POA: Diagnosis not present

## 2019-05-19 DIAGNOSIS — I272 Pulmonary hypertension, unspecified: Secondary | ICD-10-CM | POA: Diagnosis not present

## 2019-05-19 DIAGNOSIS — I480 Paroxysmal atrial fibrillation: Secondary | ICD-10-CM | POA: Diagnosis not present

## 2019-05-19 DIAGNOSIS — N189 Chronic kidney disease, unspecified: Secondary | ICD-10-CM | POA: Diagnosis not present

## 2019-05-19 DIAGNOSIS — I5020 Unspecified systolic (congestive) heart failure: Secondary | ICD-10-CM | POA: Diagnosis not present

## 2019-05-19 DIAGNOSIS — E039 Hypothyroidism, unspecified: Secondary | ICD-10-CM | POA: Diagnosis not present

## 2019-05-19 DIAGNOSIS — I5022 Chronic systolic (congestive) heart failure: Secondary | ICD-10-CM | POA: Diagnosis not present

## 2019-05-19 DIAGNOSIS — I130 Hypertensive heart and chronic kidney disease with heart failure and stage 1 through stage 4 chronic kidney disease, or unspecified chronic kidney disease: Secondary | ICD-10-CM | POA: Diagnosis not present

## 2019-05-19 DIAGNOSIS — I340 Nonrheumatic mitral (valve) insufficiency: Secondary | ICD-10-CM | POA: Diagnosis not present

## 2019-05-19 DIAGNOSIS — N179 Acute kidney failure, unspecified: Secondary | ICD-10-CM | POA: Diagnosis not present

## 2019-05-19 DIAGNOSIS — K219 Gastro-esophageal reflux disease without esophagitis: Secondary | ICD-10-CM | POA: Diagnosis not present

## 2019-05-19 DIAGNOSIS — Z800 Family history of malignant neoplasm of digestive organs: Secondary | ICD-10-CM | POA: Diagnosis not present

## 2019-05-19 DIAGNOSIS — I361 Nonrheumatic tricuspid (valve) insufficiency: Secondary | ICD-10-CM | POA: Diagnosis not present

## 2019-05-19 DIAGNOSIS — I10 Essential (primary) hypertension: Secondary | ICD-10-CM | POA: Diagnosis not present

## 2019-05-19 DIAGNOSIS — I2510 Atherosclerotic heart disease of native coronary artery without angina pectoris: Secondary | ICD-10-CM | POA: Diagnosis not present

## 2019-05-19 DIAGNOSIS — Z7901 Long term (current) use of anticoagulants: Secondary | ICD-10-CM | POA: Diagnosis not present

## 2019-05-19 DIAGNOSIS — Z880 Allergy status to penicillin: Secondary | ICD-10-CM | POA: Diagnosis not present

## 2019-05-19 DIAGNOSIS — Z950 Presence of cardiac pacemaker: Secondary | ICD-10-CM | POA: Diagnosis not present

## 2019-05-23 ENCOUNTER — Other Ambulatory Visit
Admission: RE | Admit: 2019-05-23 | Discharge: 2019-05-23 | Disposition: A | Discharge status: Home / Self Care | Payer: PPO | Source: Ambulatory Visit | Attending: Cardiovascular Disease | Admitting: Cardiovascular Disease

## 2019-05-23 DIAGNOSIS — Z20822 Contact with and (suspected) exposure to covid-19: Secondary | ICD-10-CM | POA: Diagnosis not present

## 2019-05-23 DIAGNOSIS — I480 Paroxysmal atrial fibrillation: Secondary | ICD-10-CM | POA: Diagnosis not present

## 2019-05-23 DIAGNOSIS — Z01812 Encounter for preprocedural laboratory examination: Secondary | ICD-10-CM | POA: Diagnosis not present

## 2019-05-23 LAB — BASIC METABOLIC PANEL
Anion gap: 6 (ref 5–15)
BUN: 54 mg/dL — ABNORMAL HIGH (ref 8–23)
CO2: 25 mmol/L (ref 22–32)
Calcium: 9.3 mg/dL (ref 8.9–10.3)
Chloride: 106 mmol/L (ref 98–111)
Creatinine, Ser: 1.9 mg/dL — ABNORMAL HIGH (ref 0.61–1.24)
GFR calc Af Amer: 41 mL/min — ABNORMAL LOW (ref >60)
GFR calc non Af Amer: 35 mL/min — ABNORMAL LOW (ref >60)
Glucose, Bld: 126 mg/dL — ABNORMAL HIGH (ref 70–99)
Potassium: 4.6 mmol/L (ref 3.5–5.1)
Sodium: 137 mmol/L (ref 135–145)

## 2019-05-23 NOTE — Progress Notes (Signed)
Cardiology Office Note  Date:  05/24/2019   ID:  Rector, [place] 05-Feb-1950, MRN [id number]  PCP:  Albina Billet, MD   Chief Complaint  Patient presents with  . office visit    Pt has no concerns. Meds verbally reviewed w/ pt.    HPI:  Mr. Mizael Sagar is a 69 year old gentleman with past medical history of Nonischemic cardiomyopathy Chronic systolic CHF CKD Cirrhosis, NASH, etoh Atrial fibrillation, s/p DCCV 02/16/19 LHC 02/15/19 demonstrated 60% ostial D2 stenosis.  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  treated with keflex elevated ammonia of 82, likely hepatic encephalopathy.     History of EtOH, NASH At d/c , diuretics were held, [place] held (presumably for elevated creatinine)  Seen by PA in our office 04/10/19, labs done, creatinine improved Was not restarted on diuretic had massive fluid retention, abdominal distention, leg edema, up 20 pounds Torsemide restarted 40 twice daily 5 days then down to 20 twice daily Entresto restarted  Cardiac MRI done at [facility] No CMR evidence of infiltrative cardiomyopathy. No myocardial scarring or LGE. Hypokinetic left ventricle with visually estimated ejection fraction between 30-40%.  05/02/2019: seen by Dr. Caryl Comes EP: Recommendation to proceed with A. fib ablation CR 1.38 to  1.9, BUN 39 Torsemide dosing decreased down to 20 daily  Seen by wound clinic, infection left calf area bx taken by derm, infectious disease Started on clindamycin 300 QID yesterday  Labs 05/08/19: CR 1.85, BUN 51 05/23/19  CR 1.9, BUN 54 On torsemide 20 daily, continues on Entresto Drinks a lot of fluids, on lactulose  (frequent BMS 10 bms a day)  Scheduled for A. fib ablation tomorrow at [facility] With TEE  Weight has dropped 10 pounds over the past several weeks  EKG personally reviewed by myself on todays visit Normal sinus rhythm rate 59 bpm nonspecific ST abnormality  Other past medical history reviewed Prior admission at [facility] January 2021 for decompensated heart failure, acute on chronic systolic CHF Echocardiogram confirm ejection fraction 25 to 30% moderate mitral valve regurgitation reduced RV function and severe TR  Left heart catheterization with nonobstructive disease  Transition to [facility], Had a cardioversion for atrial fibrillation normal sinus rhythm restored Notes indicating prior longstanding atrial fibrillation Maintained on Eliquis Digoxin discontinued Following cardioversion initially with junctional bradycardia, slow return of sinus node function  Baseline weight appears 213 pounds, discharged on Lasix 40 daily Discharged with a LifeVest given nonsustained VT during admission, was referred to EP  On last clinic visit March 28, 2019, had  life vest in place   Weight was 280 two years ago, poor appetite Weight 250 summer of 2020 Was not eating for long period  Weight 217 at home on last clinic visit D/c from hospital 213 Weight in our office last clinic visit 227 pounds, had LifeVest and shoes on He did have some lower extremity edema  Total bili chronically elevated, "fatty liver dz" 1.8  PMH:   has a past medical history of AF (atrial fibrillation) ([place]), Cardiomyopathy ([place]), CHF (congestive heart failure) ([place]), Diabetes mellitus without complication ([place]), Fatty liver, H/O ETOH abuse, adenomatous polyp of colon, thrombocytopenia, Hypertension,  Serum lipids high, and Thyroid disease.  PSH:    Past Surgical History:  Procedure Laterality Date  . CARDIAC CATHETERIZATION     no stents  . COLONOSCOPY    . COLONOSCOPY WITH PROPOFOL N/A 07/26/2015   Procedure: COLONOSCOPY  WITH PROPOFOL;  Surgeon: Manya Silvas, MD;  Location: [facility] ENDOSCOPY;  Service: Endoscopy;  Laterality: N/A;  . HAMMER TOE SURGERY      Current Outpatient Medications  Medication Sig Dispense Refill  . apixaban (ELIQUIS) 5 MG TABS tablet Take by mouth 2 (two) times daily.     [name] atorvastatin (LIPITOR) 10 MG tablet Take 10 mg by mouth daily.    . clindamycin (CLEOCIN) 300 MG capsule Take 300 mg by mouth every 6 (six) hours.    . clobetasol cream (TEMOVATE) 9.32 % Apply 1 application topically 2 (two) times daily.    [name] gabapentin (NEURONTIN) 100 MG capsule Take 100 mg by mouth 4 (four) times daily.    [name] lactobacillus acidophilus (BACID) TABS tablet Take 2 tablets by mouth 3 (three) times daily. 30 tablet 0  . lactulose (CHRONULAC) 10 GM/15ML solution     . levothyroxine (SYNTHROID, LEVOTHROID) 100 MCG tablet Take 100 mcg by mouth every morning.  4  . metoprolol succinate (TOPROL-XL) 25 MG 24 hr tablet 25 mg daily.     . pantoprazole (PROTONIX) 20 MG tablet Take 20 mg by mouth daily.     . potassium chloride SA (KLOR-CON) 20 MEQ tablet Take 1 tablet (20 mEq total) by mouth as directed. Take 1 tablet (20 mEq) twice a day for 5 days then go to 1 tablet (20 mEq) once daily 180 tablet 3  . sacubitril-valsartan (ENTRESTO) 24-26 MG Take 1 tablet by mouth 2 (two) times daily. 60 tablet 11  . torsemide (DEMADEX) 20 MG tablet Take 1 tablet (20 mg total) by mouth daily. 90 tablet 3   No current facility-administered medications for this visit.    Allergies:   Penicillins   Social History:  The patient  reports that he has quit smoking. His smokeless tobacco use includes snuff. He reports that he does not drink alcohol or use drugs.   Family History:   family history includes Heart Problems in his father, mother, and sister.    Review of Systems: Review of Systems  Constitutional: Negative.   HENT: Negative.   Respiratory: Negative.   Cardiovascular: Negative.   Gastrointestinal: Negative.    Musculoskeletal: Negative.        Left calf pain  Neurological: Negative.   Psychiatric/Behavioral: Negative.   All other systems reviewed and are negative.   PHYSICAL EXAM: VS:  BP 114/60 (BP Location: Left Arm, Patient Position: Sitting, Cuff Size: Normal)   Pulse (!) 59   Ht 6' 2" (1.88 m)   Wt 194 lb 8 oz (88.2 kg)   SpO2 98%   BMI 24.97 kg/m  , BMI Body mass index is 24.97 kg/m. Constitutional:  oriented to person, place, and time. No distress.  HENT:  Head: Grossly normal Eyes:  no discharge. No scleral icterus.  Neck: No JVD, no carotid bruits  Cardiovascular: Regular rate and rhythm, no murmurs appreciated Pulmonary/Chest: Clear to auscultation bilaterally, no wheezes or rails Abdominal: Soft.  no distension.  no tenderness.  Musculoskeletal: Normal range of motion Left lower extremity in a wrap below the knee Neurological:  normal muscle tone. Coordination normal. No atrophy Skin: Skin warm and dry Psychiatric: normal affect, pleasant   Recent Labs: 04/05/2019: TSH 2.342  04/10/2019: Magnesium 2.4 04/19/2019: Hemoglobin 9.3; Platelets 108 05/02/2019: ALT 18 05/23/2019: BUN 54; Creatinine, Ser 1.90; Potassium 4.6; Sodium 137    Lipid Panel No results found for: CHOL, HDL, LDLCALC, TRIG    Wt Readings from Last 3 Encounters:  05/24/19 194 lb 8 oz (88.2 kg)  05/02/19 204 lb (92.5 kg)  04/26/19 221 lb 11.2 oz (100.6 kg)     ASSESSMENT AND PLAN:  Problem List Items Addressed This Visit    None    Visit Diagnoses    Paroxysmal atrial fibrillation (HCC)    -  Primary   Relevant Orders   EKG 65-KCLE   Chronic systolic CHF (congestive heart failure) (HCC)       Nonischemic cardiomyopathy (HCC)       Hyperlipidemia LDL goal <70       Coronary artery disease of native artery of native heart with stable angina pectoris (HCC)       NSVT (nonsustained ventricular tachycardia) (HCC)         Nonischemic cardiomyopathy  MRI no infiltrative disease, ejection  fraction up to 30 to 40%  metoprolol Massive weight loss over the past 2 months from diuresis -Given climbing creatinine we will cut back torsemide to 4 days a week with potassium Continue Entresto for now, renal function stable but elevated -He does have tremendous frequency of bowel movements on his lactulose given history of encephalopathy likely resulting in fluid losses -No other medication changes made  Atrial fibrillation, persistent Appears to be maintaining normal sinus rhythm,  On Eliquis, metoprolol Was seen by EP, recommendation for A. fib ablation at [facility] tomorrow with TEE, procedure discussed --- Would hope his left lower extremity heals with clindamycin over the next 10 days to avoid irrigation debridement and need to hold his Eliquis  Chronic systolic CHF Appears euvolemic if not dry, will cut back on torsemide as above Leg swelling down, ascites improved, baseline weight likely somewhere in the 190s  Long discussion with him concerning TEE and ablation, long discussion concerning his left leg wound, antibiotics Disposition:   F/U  1 months   Total encounter time more than 45 minutes  Greater than 50% was spent in counseling and coordination of care with the patient    Signed, Esmond Plants, M.D., Ph.D. [place], [place]

## 2019-05-24 ENCOUNTER — Ambulatory Visit (INDEPENDENT_AMBULATORY_CARE_PROVIDER_SITE_OTHER): Payer: PPO | Admitting: Cardiovascular Disease

## 2019-05-24 ENCOUNTER — Encounter: Payer: Self-pay | Admitting: Cardiovascular Disease

## 2019-05-24 ENCOUNTER — Other Ambulatory Visit: Payer: Self-pay

## 2019-05-24 VITALS — BP 114/60 | HR 59 | Ht 74.0 in | Wt 194.5 lb

## 2019-05-24 DIAGNOSIS — I5022 Chronic systolic (congestive) heart failure: Secondary | ICD-10-CM

## 2019-05-24 DIAGNOSIS — I25118 Atherosclerotic heart disease of native coronary artery with other forms of angina pectoris: Secondary | ICD-10-CM

## 2019-05-24 DIAGNOSIS — I428 Other cardiomyopathies: Secondary | ICD-10-CM | POA: Diagnosis not present

## 2019-05-24 DIAGNOSIS — E785 Hyperlipidemia, unspecified: Secondary | ICD-10-CM

## 2019-05-24 DIAGNOSIS — I480 Paroxysmal atrial fibrillation: Secondary | ICD-10-CM

## 2019-05-24 DIAGNOSIS — I472 Ventricular tachycardia: Secondary | ICD-10-CM | POA: Diagnosis not present

## 2019-05-24 DIAGNOSIS — I4729 Other ventricular tachycardia: Secondary | ICD-10-CM

## 2019-05-24 MED ORDER — POTASSIUM CHLORIDE CRYS ER 20 MEQ PO TBCR: 20.0000 meq | EXTENDED_RELEASE_TABLET | ORAL | 3 refills | Status: DC

## 2019-05-24 MED ORDER — TORSEMIDE 20 MG PO TABS: 20.0000 mg | ORAL_TABLET | ORAL | 3 refills | Status: DC

## 2019-05-24 NOTE — Patient Instructions (Addendum)
Medication Instructions:  Hold torsemide and potassium Mon/Wed/Friday  Because of so many bowel movements  If you need a refill on your cardiac medications before your next appointment, please call your pharmacy.    Lab work: No new labs needed   If you have labs (blood work) drawn today and your tests are completely normal, you will receive your results only by: [name] MyChart Message (if you have MyChart) OR . A paper copy in the mail If you have any lab test that is abnormal or we need to change your treatment, we will call you to review the results.   Testing/Procedures: No new testing needed   Follow-Up: At [facility], you and your health needs are our priority.  As part of our continuing mission to provide you with exceptional heart care, we have created designated Provider Care Teams.  These Care Teams include your primary Cardiologist (physician) and Advanced Practice Providers (APPs -  Physician Assistants and Nurse Practitioners) who all work together to provide you with the care you need, when you need it.  . You will need a follow up appointment in 1 month   . Providers on your designated Care Team:   . Murray Hodgkins, NP . Christell Faith, PA-C . Marrianne Mood, PA-C  Any Other Special Instructions Will Be Listed Below (If Applicable).  For educational health videos Log in to : [email or web address] Or : [email or web address], password : [email or web address]

## 2019-05-25 DIAGNOSIS — Z79899 Other long term (current) drug therapy: Secondary | ICD-10-CM | POA: Diagnosis not present

## 2019-05-25 DIAGNOSIS — R0601 Orthopnea: Secondary | ICD-10-CM | POA: Diagnosis not present

## 2019-05-25 DIAGNOSIS — Z7901 Long term (current) use of anticoagulants: Secondary | ICD-10-CM | POA: Diagnosis not present

## 2019-05-25 DIAGNOSIS — I509 Heart failure, unspecified: Secondary | ICD-10-CM | POA: Diagnosis not present

## 2019-05-25 DIAGNOSIS — Z862 Personal history of diseases of the blood and blood-forming organs and certain disorders involving the immune mechanism: Secondary | ICD-10-CM | POA: Diagnosis not present

## 2019-05-25 DIAGNOSIS — Z86010 Personal history of colonic polyps: Secondary | ICD-10-CM | POA: Diagnosis not present

## 2019-05-25 DIAGNOSIS — I776 Arteritis, unspecified: Secondary | ICD-10-CM | POA: Diagnosis not present

## 2019-05-25 DIAGNOSIS — D472 Monoclonal gammopathy: Secondary | ICD-10-CM | POA: Diagnosis not present

## 2019-05-25 DIAGNOSIS — Z8579 Personal history of other malignant neoplasms of lymphoid, hematopoietic and related tissues: Secondary | ICD-10-CM | POA: Diagnosis not present

## 2019-05-25 DIAGNOSIS — R0602 Shortness of breath: Secondary | ICD-10-CM | POA: Diagnosis not present

## 2019-05-25 DIAGNOSIS — I2510 Atherosclerotic heart disease of native coronary artery without angina pectoris: Secondary | ICD-10-CM | POA: Diagnosis not present

## 2019-05-25 DIAGNOSIS — I4891 Unspecified atrial fibrillation: Secondary | ICD-10-CM | POA: Diagnosis not present

## 2019-05-25 DIAGNOSIS — N189 Chronic kidney disease, unspecified: Secondary | ICD-10-CM | POA: Diagnosis not present

## 2019-05-25 DIAGNOSIS — I130 Hypertensive heart and chronic kidney disease with heart failure and stage 1 through stage 4 chronic kidney disease, or unspecified chronic kidney disease: Secondary | ICD-10-CM | POA: Diagnosis not present

## 2019-05-25 DIAGNOSIS — I4819 Other persistent atrial fibrillation: Secondary | ICD-10-CM | POA: Diagnosis not present

## 2019-05-25 DIAGNOSIS — K7460 Unspecified cirrhosis of liver: Secondary | ICD-10-CM | POA: Diagnosis not present

## 2019-05-26 DIAGNOSIS — Z9889 Other specified postprocedural states: Secondary | ICD-10-CM | POA: Diagnosis not present

## 2019-05-26 DIAGNOSIS — I272 Pulmonary hypertension, unspecified: Secondary | ICD-10-CM | POA: Diagnosis not present

## 2019-05-26 DIAGNOSIS — I340 Nonrheumatic mitral (valve) insufficiency: Secondary | ICD-10-CM | POA: Diagnosis not present

## 2019-05-26 DIAGNOSIS — L0390 Cellulitis, unspecified: Secondary | ICD-10-CM | POA: Diagnosis not present

## 2019-05-26 DIAGNOSIS — K7030 Alcoholic cirrhosis of liver without ascites: Secondary | ICD-10-CM | POA: Diagnosis not present

## 2019-05-26 DIAGNOSIS — I952 Hypotension due to drugs: Secondary | ICD-10-CM | POA: Diagnosis not present

## 2019-05-26 DIAGNOSIS — I4891 Unspecified atrial fibrillation: Secondary | ICD-10-CM | POA: Diagnosis not present

## 2019-05-26 DIAGNOSIS — B9562 Methicillin resistant Staphylococcus aureus infection as the cause of diseases classified elsewhere: Secondary | ICD-10-CM | POA: Diagnosis not present

## 2019-05-26 DIAGNOSIS — I361 Nonrheumatic tricuspid (valve) insufficiency: Secondary | ICD-10-CM | POA: Diagnosis not present

## 2019-05-26 DIAGNOSIS — I5020 Unspecified systolic (congestive) heart failure: Secondary | ICD-10-CM | POA: Diagnosis not present

## 2019-05-26 DIAGNOSIS — N189 Chronic kidney disease, unspecified: Secondary | ICD-10-CM | POA: Diagnosis not present

## 2019-05-26 DIAGNOSIS — I959 Hypotension, unspecified: Secondary | ICD-10-CM | POA: Diagnosis not present

## 2019-05-27 DIAGNOSIS — N189 Chronic kidney disease, unspecified: Secondary | ICD-10-CM | POA: Diagnosis not present

## 2019-05-27 DIAGNOSIS — E785 Hyperlipidemia, unspecified: Secondary | ICD-10-CM | POA: Diagnosis not present

## 2019-05-27 DIAGNOSIS — Z9889 Other specified postprocedural states: Secondary | ICD-10-CM | POA: Diagnosis not present

## 2019-05-27 DIAGNOSIS — I959 Hypotension, unspecified: Secondary | ICD-10-CM | POA: Diagnosis not present

## 2019-05-27 DIAGNOSIS — K7030 Alcoholic cirrhosis of liver without ascites: Secondary | ICD-10-CM | POA: Diagnosis not present

## 2019-05-27 DIAGNOSIS — L03116 Cellulitis of left lower limb: Secondary | ICD-10-CM | POA: Diagnosis not present

## 2019-05-27 DIAGNOSIS — I4891 Unspecified atrial fibrillation: Secondary | ICD-10-CM | POA: Diagnosis not present

## 2019-05-27 DIAGNOSIS — I5020 Unspecified systolic (congestive) heart failure: Secondary | ICD-10-CM | POA: Diagnosis not present

## 2019-05-27 DIAGNOSIS — B9562 Methicillin resistant Staphylococcus aureus infection as the cause of diseases classified elsewhere: Secondary | ICD-10-CM | POA: Diagnosis not present

## 2019-06-02 DIAGNOSIS — I351 Nonrheumatic aortic (valve) insufficiency: Secondary | ICD-10-CM | POA: Diagnosis not present

## 2019-06-02 DIAGNOSIS — R9431 Abnormal electrocardiogram [ECG] [EKG]: Secondary | ICD-10-CM | POA: Diagnosis not present

## 2019-06-02 DIAGNOSIS — K7460 Unspecified cirrhosis of liver: Secondary | ICD-10-CM | POA: Diagnosis not present

## 2019-06-02 DIAGNOSIS — E785 Hyperlipidemia, unspecified: Secondary | ICD-10-CM | POA: Diagnosis not present

## 2019-06-02 DIAGNOSIS — I4819 Other persistent atrial fibrillation: Secondary | ICD-10-CM | POA: Diagnosis not present

## 2019-06-02 DIAGNOSIS — I129 Hypertensive chronic kidney disease with stage 1 through stage 4 chronic kidney disease, or unspecified chronic kidney disease: Secondary | ICD-10-CM | POA: Diagnosis not present

## 2019-06-02 DIAGNOSIS — I361 Nonrheumatic tricuspid (valve) insufficiency: Secondary | ICD-10-CM | POA: Diagnosis not present

## 2019-06-02 DIAGNOSIS — I480 Paroxysmal atrial fibrillation: Secondary | ICD-10-CM | POA: Diagnosis not present

## 2019-06-02 DIAGNOSIS — I517 Cardiomegaly: Secondary | ICD-10-CM | POA: Diagnosis not present

## 2019-06-02 DIAGNOSIS — E7220 Disorder of urea cycle metabolism, unspecified: Secondary | ICD-10-CM | POA: Diagnosis not present

## 2019-06-02 DIAGNOSIS — Z9889 Other specified postprocedural states: Secondary | ICD-10-CM | POA: Diagnosis not present

## 2019-06-02 DIAGNOSIS — Z7901 Long term (current) use of anticoagulants: Secondary | ICD-10-CM | POA: Diagnosis not present

## 2019-06-02 DIAGNOSIS — E039 Hypothyroidism, unspecified: Secondary | ICD-10-CM | POA: Diagnosis not present

## 2019-06-02 DIAGNOSIS — N189 Chronic kidney disease, unspecified: Secondary | ICD-10-CM | POA: Diagnosis not present

## 2019-06-02 DIAGNOSIS — Z79899 Other long term (current) drug therapy: Secondary | ICD-10-CM | POA: Diagnosis not present

## 2019-06-02 DIAGNOSIS — I4510 Unspecified right bundle-branch block: Secondary | ICD-10-CM | POA: Diagnosis not present

## 2019-06-02 DIAGNOSIS — Z880 Allergy status to penicillin: Secondary | ICD-10-CM | POA: Diagnosis not present

## 2019-06-02 DIAGNOSIS — E119 Type 2 diabetes mellitus without complications: Secondary | ICD-10-CM | POA: Diagnosis not present

## 2019-06-02 DIAGNOSIS — I272 Pulmonary hypertension, unspecified: Secondary | ICD-10-CM | POA: Diagnosis not present

## 2019-06-02 DIAGNOSIS — I5020 Unspecified systolic (congestive) heart failure: Secondary | ICD-10-CM | POA: Diagnosis not present

## 2019-06-02 DIAGNOSIS — K219 Gastro-esophageal reflux disease without esophagitis: Secondary | ICD-10-CM | POA: Diagnosis not present

## 2019-06-02 DIAGNOSIS — K5900 Constipation, unspecified: Secondary | ICD-10-CM | POA: Diagnosis not present

## 2019-06-02 DIAGNOSIS — I340 Nonrheumatic mitral (valve) insufficiency: Secondary | ICD-10-CM | POA: Diagnosis not present

## 2019-06-05 ENCOUNTER — Ambulatory Visit: Payer: PPO | Admitting: Cardiovascular Disease

## 2019-06-06 ENCOUNTER — Encounter: Payer: Self-pay | Admitting: Family

## 2019-06-06 ENCOUNTER — Other Ambulatory Visit: Payer: Self-pay

## 2019-06-06 ENCOUNTER — Ambulatory Visit (INDEPENDENT_AMBULATORY_CARE_PROVIDER_SITE_OTHER): Payer: PPO | Admitting: Family

## 2019-06-06 VITALS — BP 120/70 | HR 69 | Ht 74.0 in | Wt 209.5 lb

## 2019-06-06 DIAGNOSIS — Z7901 Long term (current) use of anticoagulants: Secondary | ICD-10-CM

## 2019-06-06 DIAGNOSIS — I428 Other cardiomyopathies: Secondary | ICD-10-CM | POA: Diagnosis not present

## 2019-06-06 DIAGNOSIS — I4819 Other persistent atrial fibrillation: Secondary | ICD-10-CM

## 2019-06-06 DIAGNOSIS — I5022 Chronic systolic (congestive) heart failure: Secondary | ICD-10-CM

## 2019-06-06 MED ORDER — ENTRESTO 24-26 MG PO TABS: 1.0000 | ORAL_TABLET | Freq: Every day | ORAL | 1 refills | Status: DC

## 2019-06-06 NOTE — Progress Notes (Signed)
Office Visit    Patient Name: [name] Date of Encounter: 06/06/2019  Primary Care Provider:  Albina Billet, MD Primary Cardiologist:  Ida Rogue, MD Electrophysiologist:  None   Chief Complaint    [name] is a 69 y.o. male with a hx of paroxysmal atrial fibrillation s/p ablation 05/25/2019 on Eliquis, chronic systolic heart failure, nonischemic cardiomyopathy, cirrhosis, NASH, CKD, hypothyroidism, HLD presents today for follow up after atrial fibrillation ablation.    Past Medical History    Past Medical History:  Diagnosis Date  . AF (atrial fibrillation) ([place])   . Cardiomyopathy ([place])   . CHF (congestive heart failure) ([place])   . Diabetes mellitus without complication ([place])   . Fatty liver   . H/O ETOH abuse   . Hx of adenomatous polyp of colon   . Hx of thrombocytopenia   . Hypertension   . Serum lipids high   . Thyroid disease    Past Surgical History:  Procedure Laterality Date  . CARDIAC CATHETERIZATION     no stents  . COLONOSCOPY    . COLONOSCOPY WITH PROPOFOL N/A 07/26/2015   Procedure: COLONOSCOPY WITH PROPOFOL;  Surgeon: Manya Silvas, MD;  Location: [facility] ENDOSCOPY;  Service: Endoscopy;  Laterality: N/A;  . HAMMER TOE SURGERY      Allergies  Allergies  Allergen Reactions  . Penicillins Hives    History of Present Illness    [name] is a 68 y.o. male with a hx of  paroxysmal atrial fibrillation s/p ablation 05/25/2019 on Eliquis, chronic systolic heart failure, nonischemic cardiomyopathy, cirrhosis, NASH, CKD, hypothyroidism, HLD.  He was last seen by Dr. Rockey Situ 05/24/2019.  Underwent atrial fibrillation ablation at [facility] on 05/25/2019.  Per care everywhere documentation metoprolol was held due to previous hypotension but resumed on discharge.  His Entresto was stopped due to hypotension he was recommended to resume according to his outpatient physician.  Seen in follow-up 06/02/2019 by EP at [facility].  He was recommended for lifelong  anticoagulation with Eliquis 5 mg twice daily.  His Carafate was stopped due to causing constipation which was concerning the setting of liver dysfunction and intermittent elevated ammonia levels.  He was referred to GI.  He was recommended to continue Protonix.  Present today with his wife.  We discussed why Delene Loll was discontinued due to recent hypotension.  Reports blood pressures at home have routinely been 1 teens over 70s.  Denies lightheadedness, dizziness, near-syncope, syncope.  Reports feeling overall well since his atrial fibrillation ablation feeling he has more energy.  Reports no shortness of breath and only dyspnea with more than usual activity.  No chest pain, pressure, tightness.  EKGs/Labs/Other Studies Reviewed:   The following studies were reviewed today:    05/26/2019 TTE  LV moderately dilated in size with normal wall thickness.  LV systolic function severely decreased, LVEF visually estimated at 35%.  Mitral valve leaflets are mildly thickened with normal leaflet mobility  There is mild to moderate mitral valve regurgitation.  The left atrium is severely dilated in size.  The right ventricle is severely dilated in size, with moderately reduced systolic function.  There is severe tricuspid regurgitation.  There is mild-moderate pulmonary hypertension, estimated pulmonary artery systolic pressure is 41 mmHg.  The right atrium is severely dilated in size.  IVC size and inspiratory change suggest elevated right atrial pressure. (10-20 mmHg).   02/11/2019 TTE 1. The left ventricle is mildly dilated in size with normal wall thickness.  2. LV systolic function severely decreased, LVEF estimated 25-30% 3. Mitral valve leaflets are mildly thickened with normal leaflet mobility. 4. There is moderate mitral valve regurgitation. 5. Aortic valve is trileaflet with mildly thickened leaflets, normal excursion 6. There is mild aortic regurgitation. 7. The left atrium is  moderately dilated in size. 8. RV is severely dilated in size, with reduced systolic function. 9. There is severe tricuspid regurgitation. 10. There is severe pulmonary hypertension, estimated pulmonary artery systolic pressure is 77 mmHg. 11. The right atrium is severely dilated in size. 12. IVC size and inspiratory change suggest elevated right atrial pressure. (10-20 mmHg).  **  Cardiac CT/MRI/Nuclear Tests:  02/14/2019 PET Stress - Abnormal myocardial perfusion study - There is a small in size, mild in severity, nearly completely reversible defect involving the apical and apical anterior segments. This is consistent with scar with peri-infarct ischemia. - There is a small in size, mild in severity, partially reversible defect involving the basal inferolateral and basal inferolateral segments. This is consistent with scar and liminal peri-infarct ischemia. Cannot rule out artifact. - During stress: Global systolic function is severely reduced. The ejection fraction calculated at 26%. The left ventricle is dilated. Right ventricular chamber size is severely dilated. Right ventricular systolic function is reduced. - 3 vessel coronary calcifications are noted - Small bilateral pleural effusions and large volume ascites are noted - Incidentally noted is a 3 mm nodule in the right upper lobe (series 6, image 19). Comparison with outside films is recommended if available. If not available, then by Fleischner criteria consider a dedicated chest CT in 12 months if cancer risk factors ARE present.  EKG:  EKG is ordered today.  The ekg ordered today demonstrates NSR 69 bpm with nonspecific IVCD and nonspecific ST/T wave changes. Stable compared to previous.   Recent Labs: 04/05/2019: TSH 2.342 04/10/2019: Magnesium 2.4 04/19/2019: Hemoglobin 9.3; Platelets 108 05/02/2019: ALT 18 05/23/2019: BUN 54; Creatinine, Ser 1.90; Potassium 4.6; Sodium 137  Recent Lipid Panel No results found for: CHOL, TRIG,  HDL, CHOLHDL, VLDL, LDLCALC, LDLDIRECT  Home Medications   Current Meds  Medication Sig  . apixaban (ELIQUIS) 5 MG TABS tablet Take by mouth 2 (two) times daily.   [name] atorvastatin (LIPITOR) 10 MG tablet Take 10 mg by mouth daily.  . clindamycin (CLEOCIN) 300 MG capsule Take 300 mg by mouth every 6 (six) hours.  . clobetasol cream (TEMOVATE) 9.32 % Apply 1 application topically 2 (two) times daily.  [name] gabapentin (NEURONTIN) 100 MG capsule Take 300 mg by mouth 3 (three) times daily.   [name] lactobacillus acidophilus (BACID) TABS tablet Take 2 tablets by mouth 3 (three) times daily.  [name] lactulose (CHRONULAC) 10 GM/15ML solution   . levothyroxine (SYNTHROID, LEVOTHROID) 100 MCG tablet Take 100 mcg by mouth every morning.  . metoprolol succinate (TOPROL-XL) 25 MG 24 hr tablet 25 mg daily.   . pantoprazole (PROTONIX) 20 MG tablet Take 40 mg by mouth 2 (two) times daily.   . potassium chloride SA (KLOR-CON) 20 MEQ tablet Take 1 tablet (20 mEq total) by mouth as directed. Take 1 tablet (20 mEq) once daily. Hold on Monday/Wednesdy/Friday  . sucralfate (CARAFATE) 1 g tablet Take 1 g by mouth 4 (four) times daily -  with meals and at bedtime.  . torsemide (DEMADEX) 20 MG tablet Take 1 tablet (20 mg total) by mouth as directed. Hold on Monday/Wednesdy/Friday      Review of Systems       Review of Systems  Constitution: Negative for chills, fever and malaise/fatigue.  Cardiovascular: Positive for dyspnea on exertion. Negative for chest pain, irregular heartbeat, leg swelling, near-syncope, orthopnea, palpitations and syncope.  Respiratory: Negative for cough, shortness of breath and wheezing.   Gastrointestinal: Negative for melena, nausea and vomiting.  Genitourinary: Negative for hematuria.  Neurological: Negative for dizziness, light-headedness and weakness.   All other systems reviewed and are otherwise negative except as noted above.  Physical Exam    VS:  BP 120/70 (BP Location: Left Arm,  Patient Position: Sitting, Cuff Size: Normal)   Pulse 69   Ht 6\' 2"  (1.88 m)   Wt 209 lb 8 oz (95 kg)   SpO2 96%   BMI 26.90 kg/m  , BMI Body mass index is 26.9 kg/m. GEN: Well nourished, well developed, in no acute distress. HEENT: normal. Neck: Supple, no JVD, carotid bruits, or masses. Cardiac: RRR, no murmurs, rubs, or gallops. No clubbing, cyanosis, edema.  Radials/DP/PT 2+ and equal bilaterally.  Respiratory:  Respirations regular and unlabored, clear to auscultation bilaterally. GI: Soft, nontender, nondistended, BS + x 4. MS: No deformity or atrophy. Skin: Warm and dry, no rash. Neuro:  Strength and sensation are intact. Psych: Normal affect.  Assessment & Plan    1. Chronic systolic heart failure/nonischemic cardiomyopathy -euvolemic and well compensated on exam today.  Entresto recently held in the setting of hypotension.  Blood pressures have been 1 teens over 70s at home.  No lightheadedness, dizziness, near syncope.  GDMT presently includes Toprol 25 mg daily, torsemide 20 mg 3 times per week.  Resume Entresto 24-26 mg at bedtime.  We will monitor blood pressure carefully for 2 weeks and have repeat BMP in 2 weeks.  At that time we will assess adding back twice daily. 2. Persistent atrial fibrillation -s/p recent ablation.  Follows with EP at [facility]. 3. Chronic anticoagulation -secondary to persistent atrial fibrillation.  Denies bleeding complications.  Continue Eliquis 5 mg twice daily. 4. Cirrhosis/NASH -upcoming appoint with GI.  Disposition: Follow up in 2 month(s) with Dr. Rockey Situ or APP.    Loel Dubonnet, NP 06/06/2019, 9:54 PM

## 2019-06-06 NOTE — Patient Instructions (Addendum)
Medication Instructions:  Your physician has recommended you make the following change in your medication:   START Entresto 24/26mg  one tablet daily at bedtime  You may stop Carafate (Sucralfate) if needed for constipation. Your electrophysiologist at [facility] said this was okay as well.   *If you need a refill on your cardiac medications before your next appointment, please call your pharmacy*  Lab Work: Your physician recommends that you return for lab work in: 2 weeks Go to the [facility] in 2 weeks for BMET. You do not need to be fasting. You do not need an appointment.   If you have labs (blood work) drawn today and your tests are completely normal, you will receive your results only by: [name] MyChart Message (if you have MyChart) OR . A paper copy in the mail If you have any lab test that is abnormal or we need to change your treatment, we will call you to review the results.  Testing/Procedures: Your EKG today shows normal sinus rhythm.   Follow-Up: At [facility], you and your health needs are our priority.  As part of our continuing mission to provide you with exceptional heart care, we have created designated Provider Care Teams.  These Care Teams include your primary Cardiologist (physician) and Advanced Practice Providers (APPs -  Physician Assistants and Nurse Practitioners) who all work together to provide you with the care you need, when you need it.  We recommend signing up for the patient portal called "MyChart".  Sign up information is provided on this After Visit Summary.  MyChart is used to connect with patients for Virtual Visits (Telemedicine).  Patients are able to view lab/test results, encounter notes, upcoming appointments, etc.  Non-urgent messages can be sent to your provider as well.   To learn more about what you can do with MyChart, go to [email or web address].    Your next appointment:   2 month(s)  The format for your next appointment:   In  Person  Provider:   You may see Ida Rogue, MD or one of the following Advanced Practice Providers on your designated Care Team:    Murray Hodgkins, NP  Christell Faith, PA-C  Marrianne Mood, PA-C  Laurann Montana, NP  Other Instructions  We will check your blood work in 2 weeks.   Keep checking your blood pressure at least once per day.   We will call you in 2 weeks to check how your blood pressure is on the Entresto once per day. At that time we will discuss if we want to increase to twice daily.

## 2019-06-09 DIAGNOSIS — D649 Anemia, unspecified: Secondary | ICD-10-CM | POA: Diagnosis not present

## 2019-06-09 DIAGNOSIS — E038 Other specified hypothyroidism: Secondary | ICD-10-CM | POA: Diagnosis not present

## 2019-06-09 DIAGNOSIS — E785 Hyperlipidemia, unspecified: Secondary | ICD-10-CM | POA: Diagnosis not present

## 2019-06-09 DIAGNOSIS — E119 Type 2 diabetes mellitus without complications: Secondary | ICD-10-CM | POA: Diagnosis not present

## 2019-06-12 DIAGNOSIS — I5022 Chronic systolic (congestive) heart failure: Secondary | ICD-10-CM | POA: Diagnosis not present

## 2019-06-12 DIAGNOSIS — I428 Other cardiomyopathies: Secondary | ICD-10-CM | POA: Diagnosis not present

## 2019-06-12 DIAGNOSIS — Z86010 Personal history of colonic polyps: Secondary | ICD-10-CM | POA: Diagnosis not present

## 2019-06-12 DIAGNOSIS — C9000 Multiple myeloma not having achieved remission: Secondary | ICD-10-CM | POA: Diagnosis not present

## 2019-06-12 DIAGNOSIS — K7030 Alcoholic cirrhosis of liver without ascites: Secondary | ICD-10-CM | POA: Diagnosis not present

## 2019-06-12 DIAGNOSIS — D696 Thrombocytopenia, unspecified: Secondary | ICD-10-CM | POA: Diagnosis not present

## 2019-06-12 DIAGNOSIS — Z8719 Personal history of other diseases of the digestive system: Secondary | ICD-10-CM | POA: Diagnosis not present

## 2019-06-12 DIAGNOSIS — I480 Paroxysmal atrial fibrillation: Secondary | ICD-10-CM | POA: Diagnosis not present

## 2019-06-12 DIAGNOSIS — R1013 Epigastric pain: Secondary | ICD-10-CM | POA: Diagnosis not present

## 2019-06-12 DIAGNOSIS — R1310 Dysphagia, unspecified: Secondary | ICD-10-CM | POA: Diagnosis not present

## 2019-06-14 ENCOUNTER — Telehealth: Payer: Self-pay | Admitting: Cardiovascular Disease

## 2019-06-14 NOTE — Telephone Encounter (Signed)
Called and s/w [facility] she will cancel procedure. We will need to call/fax ok for her to schedule in the future.   Pt has appt 08/07/2019@4 :00 PM w/Dr Rockey Situ. Per Denyse Amass, That is fine. We will have to call him closer to his surgery to confirm anticoagulation status.

## 2019-06-14 NOTE — Telephone Encounter (Signed)
Primary Cardiologist:Timothy Rockey Situ, MD  Chart reviewed as part of pre-operative protocol coverage. Because of [name]'s past medical history and time since last visit, he/she will require a follow-up visit in order to better assess preoperative cardiovascular risk.  Pt will need to complete 3 months of uninterrupted anticoagulation after his ablation which was on 05/25/19. Unless colonoscopy is urgent, procedure date will need to be moved out to earliest procedure date of August 2. At that time, would be able to hold Eliquis for 2 days prior to procedure.  Pre-op covering staff: - Please schedule appointment and call patient to inform them. - Please contact requesting surgeon's office via preferred method (i.e, phone, fax) to inform them of need for appointment prior to surgery.  If applicable, this message will also be routed to pharmacy pool and/or primary cardiologist for input on holding anticoagulant/antiplatelet agent as requested below so that this information is available at time of patient's appointment.   Deberah Pelton, NP  06/14/2019, 3:51 PM

## 2019-06-14 NOTE — Telephone Encounter (Signed)
   [facility] Pre-operative Risk Assessment    HEARTCARE STAFF: - Please ensure there is not already an duplicate clearance open for this procedure. - Under Visit Info/Reason for Call, type in Other and utilize the format Clearance MM/DD/YY or Clearance TBD. Do not use dashes or single digits. - If request is for dental extraction, please clarify the # of teeth to be extracted.  Request for surgical clearance:  1. What type of surgery is being performed? Colonoscopy & EGD   2. When is this surgery scheduled? 08/14/19  3. What type of clearance is required (medical clearance vs. Pharmacy clearance to hold med vs. Both)? both  4. Are there any medications that need to be held prior to surgery and how long? Eliquis 5 MG instructions  5. Practice name and name of physician performing surgery? [facility] - Dr Madolyn Frieze  6. What is the office phone number? [phone]   7.   What is the office fax number? [phone]  8.   Anesthesia type (None, local, MAC, general) ? Monitored    [name] 06/14/2019, 2:30 PM  _________________________________________________________________   (provider comments below)

## 2019-06-14 NOTE — Telephone Encounter (Signed)
Patient with diagnosis of afib on Eliquis for anticoagulation.    Procedure: colonoscopy and EGD Date of procedure: 08/14/19  CHADS2-VASc score of 5 (age, CHF, HTN, DM, CAD). Pt underwent ablation on 05/25/19.  CrCl 3mL/min Platelet count 108K  Pt will need to complete 3 months of uninterrupted anticoagulation after his ablation which was on 05/25/19. Unless colonoscopy is urgent, procedure date will need to be moved out to earliest procedure date of August 2. At that time, would be able to hold Eliquis for 2 days prior to procedure.

## 2019-06-16 DIAGNOSIS — R42 Dizziness and giddiness: Secondary | ICD-10-CM | POA: Diagnosis not present

## 2019-06-16 DIAGNOSIS — G250 Essential tremor: Secondary | ICD-10-CM | POA: Diagnosis not present

## 2019-06-16 NOTE — Telephone Encounter (Signed)
I will forward the clearance notes to MD Dr. Rockey Situ for appt 08/07/19 reflecting the procedure seems to have been cancelled, see previous notes on clearance. I will also send notes to Dr. Alice Reichert. I will remove from the pre op call back pool.

## 2019-06-20 ENCOUNTER — Other Ambulatory Visit: Payer: Self-pay | Admitting: Gastroenterology

## 2019-06-20 ENCOUNTER — Other Ambulatory Visit
Admission: RE | Admit: 2019-06-20 | Discharge: 2019-06-20 | Disposition: A | Discharge status: Home / Self Care | Payer: PPO | Source: Ambulatory Visit | Attending: Family | Admitting: Family

## 2019-06-20 DIAGNOSIS — K7030 Alcoholic cirrhosis of liver without ascites: Secondary | ICD-10-CM | POA: Diagnosis not present

## 2019-06-20 DIAGNOSIS — I4819 Other persistent atrial fibrillation: Secondary | ICD-10-CM

## 2019-06-20 DIAGNOSIS — I5022 Chronic systolic (congestive) heart failure: Secondary | ICD-10-CM

## 2019-06-20 DIAGNOSIS — I428 Other cardiomyopathies: Secondary | ICD-10-CM

## 2019-06-21 ENCOUNTER — Encounter: Payer: Self-pay | Admitting: Family

## 2019-06-23 ENCOUNTER — Ambulatory Visit: Payer: PPO | Admitting: Family

## 2019-06-26 ENCOUNTER — Telehealth: Payer: Self-pay | Admitting: Cardiovascular Disease

## 2019-06-26 NOTE — Telephone Encounter (Signed)
Patient recently had blood work done at [facility] on 6/1 and wanted to make office aware to check results.

## 2019-06-27 NOTE — Telephone Encounter (Signed)
Spoke with patients girlfriend per release form and reviewed that labs are viewable using our charting system. Let her know that I would make provider aware there are new results updated in Epic and she verbalized understanding with no further questions at this time.

## 2019-06-30 ENCOUNTER — Other Ambulatory Visit: Payer: Self-pay

## 2019-06-30 ENCOUNTER — Ambulatory Visit
Admission: RE | Admit: 2019-06-30 | Discharge: 2019-06-30 | Disposition: A | Discharge status: Home / Self Care | Payer: PPO | Source: Ambulatory Visit | Attending: Gastroenterology | Admitting: Gastroenterology

## 2019-06-30 ENCOUNTER — Other Ambulatory Visit: Payer: Self-pay | Admitting: Gastroenterology

## 2019-06-30 DIAGNOSIS — K7030 Alcoholic cirrhosis of liver without ascites: Secondary | ICD-10-CM

## 2019-06-30 DIAGNOSIS — K7460 Unspecified cirrhosis of liver: Secondary | ICD-10-CM | POA: Diagnosis not present

## 2019-06-30 NOTE — Telephone Encounter (Signed)
Much of the lab work reviewed I would comment on the renal function which is improving previous creatinine 1.9 now down to 1.5 which looks great CBC reviewed blood count slowly trending back upwards, still mildly anemic No medication changes to make

## 2019-07-03 NOTE — Telephone Encounter (Signed)
Spoke with patient and reviewed providers information. He verbalized understanding and confirmed upcoming appointment.

## 2019-07-14 ENCOUNTER — Ambulatory Visit (INDEPENDENT_AMBULATORY_CARE_PROVIDER_SITE_OTHER): Payer: PPO | Admitting: Nurse Practitioner

## 2019-07-14 ENCOUNTER — Other Ambulatory Visit: Payer: Self-pay

## 2019-07-14 ENCOUNTER — Encounter: Payer: Self-pay | Admitting: Nurse Practitioner

## 2019-07-14 VITALS — BP 108/54 | HR 95 | Ht 74.0 in | Wt 213.0 lb

## 2019-07-14 DIAGNOSIS — I4819 Other persistent atrial fibrillation: Secondary | ICD-10-CM | POA: Diagnosis not present

## 2019-07-14 DIAGNOSIS — I10 Essential (primary) hypertension: Secondary | ICD-10-CM | POA: Diagnosis not present

## 2019-07-14 DIAGNOSIS — I428 Other cardiomyopathies: Secondary | ICD-10-CM

## 2019-07-14 DIAGNOSIS — I5022 Chronic systolic (congestive) heart failure: Secondary | ICD-10-CM | POA: Diagnosis not present

## 2019-07-14 MED ORDER — METOPROLOL SUCCINATE ER 25 MG PO TB24: 37.5000 mg | ORAL_TABLET | Freq: Every day | ORAL | 11 refills | Status: DC

## 2019-07-14 NOTE — Patient Instructions (Signed)
Medication Instructions:  1- INCREASE Toprol Take 1.5 tablets (37.5 mg total) by mouth daily *If you need a refill on your cardiac medications before your next appointment, please call your pharmacy*   Lab Work: None ordered If you have labs (blood work) drawn today and your tests are completely normal, you will receive your results only by: [name] MyChart Message (if you have MyChart) OR . A paper copy in the mail If you have any lab test that is abnormal or we need to change your treatment, we will call you to review the results.   Testing/Procedures: None ordered   Follow-Up: At [facility], you and your health needs are our priority.  As part of our continuing mission to provide you with exceptional heart care, we have created designated Provider Care Teams.  These Care Teams include your primary Cardiologist (physician) and Advanced Practice Providers (APPs -  Physician Assistants and Nurse Practitioners) who all work together to provide you with the care you need, when you need it.  We recommend signing up for the patient portal called "MyChart".  Sign up information is provided on this After Visit Summary.  MyChart is used to connect with patients for Virtual Visits (Telemedicine).  Patients are able to view lab/test results, encounter notes, upcoming appointments, etc.  Non-urgent messages can be sent to your provider as well.   To learn more about what you can do with MyChart, go to [email or web address].    Your next appointment:   As scheduled   Other Instructions Call [facility] for further instruction regarding a fib and treatment.

## 2019-07-14 NOTE — Progress Notes (Signed)
Office Visit    Patient Name: [name] Date of Encounter: 07/14/2019  Primary Care Provider:  Albina Billet, MD Primary Cardiologist:  [name] Rogue, MD  Chief Complaint    69 year old male with a history of persistent atrial fibrillation status post catheter ablation, HFrEF, nonischemic cardiomyopathy, cirrhosis, alcohol abuse, stage III chronic kidney disease, hypothyroidism, diabetes, nonobstructive CAD, hypertension, hyperlipidemia, and macrocytic anemia, who presents for follow-up related to elevated heart rates.  Past Medical History    Past Medical History:  Diagnosis Date  . (HFpEF) heart failure with preserved ejection fraction ([name])    a. 03/2019 cMRI: EF 30-40%, glob HK; b. 05/2019 Echo [name]): EF 35%.  . CKD (chronic kidney disease), stage III   . Diabetes mellitus without complication ([place])   . Fatty liver   . H/O ETOH abuse   . Hx of adenomatous polyp of colon   . Hx of thrombocytopenia   . Hypertension   . Macrocytic anemia   . Mixed hyperlipidemia   . NICM (nonischemic cardiomyopathy) ([place])    a. 03/2019 cMRI: EF 30-40%, glob HK. No LGE. No evidence of infiltrative cardiomyopathy; b. 05/2019 Echo [name]): EF 35%, mild to mod MR, sev dil LA, sev dil RV w/ mod reduced RV fxn. Sev TR. Mild-mod PAH (PASP 22mmHg), sev dil RA.  [name] Nonobstructive CAD (coronary artery disease)    a. 02/2019 Cath [name]): LM nl, LAD 66m, D1 small, D2 60ost, D3 small, LCX nl, OM2 25, RCA 66m, 25d.  [name] Persistent atrial fibrillation ([name])    a. CHA2DS2VASc = 5-->eliquis; b. 05/2019 s/p RFCA/PVI [name]).  . Valvular heart disease    a. 05/2019 Echo [name]): mild to mod MR, sev TR.   Past Surgical History:  Procedure Laterality Date  . CARDIAC CATHETERIZATION     no stents  . COLONOSCOPY    . COLONOSCOPY WITH PROPOFOL N/A 07/26/2015   Procedure: COLONOSCOPY WITH PROPOFOL;  Surgeon: [name] Silvas, MD;  Location: [name];  Service: Endoscopy;  Laterality: N/A;  . HAMMER TOE [name]       Allergies  Allergies  Allergen Reactions  . Penicillins Hives    History of Present Illness    69 year old male with above complex past medical history including persistent atrial fibrillation, HFrEF, nonischemic cardiomyopathy, cirrhosis, alcohol abuse, stage III chronic kidney disease, hypothyroidism, diabetes, nonobstructive CAD, hypertension, hyperlipidemia, and macrocytic anemia.  He has undergone extensive work-up for cardiomyopathy at [name] with diagnostic catheterization in February of this year showing minimal nonobstructive CAD.  Cardiac MRI in March of this year revealed an EF of 30 to 40% with global hypokinesis.  There was no LGE or evidence of infiltrative cardiomyopathy.  In May, he underwent A. fib ablation.  Post procedure, he was bradycardic and hypotensive requiring reduction in beta-blocker dosing and discontinuation of Entresto.  He was last seen in cardiology clinic on May 18, at which time he was feeling well.  Entresto 24-26 mg was added to his regimen, to be taken at nighttime with a plan to add a morning dose if he could tolerate.  Since his last visit, [name] has continued to feel well.  He denies any chest pain or dyspnea.  His chronic, mild lower extremity edema which has been stable.  He notes a good appetite without any worsening of abdominal girth.  He weighs himself daily and does note occasional pound or 2 weight gain during the week but seems to get better after taking his torsemide, which  he is currently taking on Sunday, Tuesday, Thursday, and Saturday.  His wife checks his heart rate and blood pressure daily and on June 22, she noticed that his heart rate had risen to 116 bpm that morning.  Since then, he has had a few more readings in the low 100s in the mornings with 80s in the afternoons.  He has been completely asymptomatic.  Out of concern for elevated heart rates, they arrange for appointment today.  He is in atrial flutter with variable block.  Home  Medications    Prior to Admission medications   Medication Sig Start Date End Date Taking? Authorizing Provider  apixaban (ELIQUIS) 5 MG TABS tablet Take by mouth 2 (two) times daily.  02/18/19  Yes [provider]  atorvastatin (LIPITOR) 10 MG tablet Take 10 mg by mouth daily.   Yes [provider]  clindamycin (CLEOCIN) 300 MG capsule Take 300 mg by mouth every 6 (six) hours. 05/22/19  Yes [provider]  clobetasol cream (TEMOVATE) 6.22 % Apply 1 application topically 2 (two) times daily.   Yes [provider]  gabapentin (NEURONTIN) 100 MG capsule Take 300 mg by mouth 3 (three) times daily.    Yes [provider]  lactobacillus acidophilus (BACID) TABS tablet Take 2 tablets by mouth 3 (three) times daily. 04/07/19  Yes [name] Hanlon, MD  lactulose ([name]) 10 GM/15ML solution  05/03/19  Yes [provider]  levothyroxine (SYNTHROID, LEVOTHROID) 100 MCG tablet Take 100 mcg by mouth every morning. 10/29/17  Yes [provider]  metoprolol succinate (TOPROL-XL) 25 MG 24 hr tablet 25 mg daily.  02/10/16  Yes [provider]  pantoprazole (PROTONIX) 20 MG tablet Take 40 mg by mouth 2 (two) times daily.  02/02/19  Yes [provider]  potassium chloride SA (KLOR-CON) 20 MEQ tablet Take 1 tablet (20 mEq total) by mouth as directed. Take 1 tablet (20 mEq) once daily. Hold on Monday/Wednesdy/Friday 05/24/19  Yes Gollan, [name] November, MD  sacubitril-valsartan (ENTRESTO) 24-26 MG Take 1 tablet by mouth at bedtime. 06/06/19  Yes [name] Dubonnet, NP  sucralfate (CARAFATE) 1 g tablet Take 1 g by mouth 4 (four) times daily -  with meals and at bedtime.   Yes [provider]  torsemide (DEMADEX) 20 MG tablet Take 1 tablet (20 mg total) by mouth as directed. Hold on Monday/Wednesdy/Friday 05/24/19 08/22/19 Yes [name] Merritts, MD    Review of Systems    He has chronic, mild lower extremity swelling which has been stable.  He  denies chest pain, dyspnea, palpitations, PND, orthopnea, dizziness, syncope, or early satiety.  All other systems reviewed and are otherwise negative except as noted above.  Physical Exam    VS:  BP (!) 108/54 (BP Location: Left Arm, Patient Position: Sitting, Cuff Size: Normal)   Pulse 95   Ht 6\' 2"  (1.88 m)   Wt 213 lb (96.6 kg)   SpO2 95%   BMI 27.35 kg/m  , BMI Body mass index is 27.35 kg/m. GEN: Well nourished, well developed, in no acute distress. HEENT: normal. Neck: Supple, JVP approximately 10 cm, no carotid bruits, or masses. Cardiac: Irregularly irregular, no murmurs, rubs, or gallops. No clubbing, cyanosis, trace bilateral ankle edema.  Radials/PT 1+ and equal bilaterally.  Respiratory:  Respirations regular and unlabored, clear to auscultation bilaterally. GI: Soft, nontender, nondistended, BS + x 4. MS: no deformity or atrophy. Skin: warm and dry, no rash. Neuro:  Strength and sensation are intact.  Psych: Normal affect.  Accessory Clinical Findings    ECG personally reviewed by me today -atrial flutter with variable block, 95, incomplete right bundle branch block, inferior infarct, anterolateral ST depression   Lab Results  Component Value Date   WBC 4.7 04/19/2019   HGB 9.3 (L) 04/19/2019   HCT 29.7 (L) 04/19/2019   MCV 112.5 (H) 04/19/2019   PLT 108 (L) 04/19/2019   Lab Results  Component Value Date   CREATININE 1.90 (H) 05/23/2019   BUN 54 (H) 05/23/2019   NA 137 05/23/2019   K 4.6 05/23/2019   CL 106 05/23/2019   CO2 25 05/23/2019   Lab Results  Component Value Date   ALT 18 05/02/2019   AST 36 05/02/2019   ALKPHOS 167 (H) 05/02/2019   BILITOT 1.1 05/02/2019    Lab Results  Component Value Date   HGBA1C 5.9 (H) 03/09/2017    Assessment & Plan    1.  Persistent atrial fibrillation/atrial flutter with variable block: Patient with history of atrial arrhythmias status post catheter ablation at [name] in May.  Post procedure, he was maintaining  sinus rhythm but his wife has noticed periodic elevations in heart rates since this past Tuesday.  Rates have sometimes been in the low 100s in the mornings but are typically in the 80s in the afternoon.  He is completely asymptomatic.  ECG today suggests atrial flutter with variable block at a rate of 95 bpm.  I am going to increase his Toprol-XL to 37.5 mg daily and he plans to follow-up with [name] for consideration of cardioversion plus minus antiarrhythmic therapy versus touchup catheter ablation.  Follow-up basic metabolic panel today.  2.  HFrEF/nonischemic cardiomyopathy: Patient has been doing well from a symptomatic standpoint at home.  At his last visit, low-dose Entresto was added at night only given soft blood pressures.  Pressure is 108/54 today.  As I am titrating metoprolol in the setting of above, I will hold off on adding a morning dose of Entresto though we will need to consider this in the future.  He is relatively euvolemic on examination and feeling well.  He is currently taking torsemide 20 mg on Tuesdays, Thursdays, Saturdays, and Sundays.  In the setting of recurrence of A. fib/flutter, I have advised that he continue to follow his weight closely and contact us for any gains of 2 pounds over 24 hours or 5 pounds in the course of the week as we would likely need to adjust his torsemide further.  He is not currently on an MRA in the setting of soft blood pressures.  3.  Essential hypertension: Pressure run soft beta-blocker and Entresto therapy.  4.  Hyperlipidemia: On statin therapy.  5.  Stage III chronic kidney disease: Recent labs performed at [name] on June 1 with improvement in creatinine to 1.5.  Follow-up today.  6.  Hepatic cirrhosis: Followed by GI.  7.  Disposition: Patient to contact his EP providers at [name] and arrange follow-up related to recurrence of atrial arrhythmias.  He has follow-up with Dr. Rockey Situ on July 19.  Murray Hodgkins, NP 07/14/2019, 12:46 PM

## 2019-07-21 DIAGNOSIS — I5022 Chronic systolic (congestive) heart failure: Secondary | ICD-10-CM | POA: Diagnosis not present

## 2019-07-21 DIAGNOSIS — Z7901 Long term (current) use of anticoagulants: Secondary | ICD-10-CM | POA: Diagnosis not present

## 2019-07-21 DIAGNOSIS — R7303 Prediabetes: Secondary | ICD-10-CM | POA: Diagnosis not present

## 2019-07-21 DIAGNOSIS — I4819 Other persistent atrial fibrillation: Secondary | ICD-10-CM | POA: Diagnosis not present

## 2019-07-21 DIAGNOSIS — Z880 Allergy status to penicillin: Secondary | ICD-10-CM | POA: Diagnosis not present

## 2019-07-21 DIAGNOSIS — I130 Hypertensive heart and chronic kidney disease with heart failure and stage 1 through stage 4 chronic kidney disease, or unspecified chronic kidney disease: Secondary | ICD-10-CM | POA: Diagnosis not present

## 2019-07-21 DIAGNOSIS — E039 Hypothyroidism, unspecified: Secondary | ICD-10-CM | POA: Diagnosis not present

## 2019-07-21 DIAGNOSIS — K219 Gastro-esophageal reflux disease without esophagitis: Secondary | ICD-10-CM | POA: Diagnosis not present

## 2019-07-21 DIAGNOSIS — N189 Chronic kidney disease, unspecified: Secondary | ICD-10-CM | POA: Diagnosis not present

## 2019-07-21 DIAGNOSIS — I272 Pulmonary hypertension, unspecified: Secondary | ICD-10-CM | POA: Diagnosis not present

## 2019-07-21 DIAGNOSIS — Z86010 Personal history of colonic polyps: Secondary | ICD-10-CM | POA: Diagnosis not present

## 2019-07-21 DIAGNOSIS — Z79899 Other long term (current) drug therapy: Secondary | ICD-10-CM | POA: Diagnosis not present

## 2019-08-02 ENCOUNTER — Inpatient Hospital Stay: Payer: PPO | Attending: Oncology

## 2019-08-02 ENCOUNTER — Other Ambulatory Visit: Payer: Self-pay

## 2019-08-02 DIAGNOSIS — I5032 Chronic diastolic (congestive) heart failure: Secondary | ICD-10-CM | POA: Diagnosis not present

## 2019-08-02 DIAGNOSIS — D649 Anemia, unspecified: Secondary | ICD-10-CM | POA: Insufficient documentation

## 2019-08-02 DIAGNOSIS — I110 Hypertensive heart disease with heart failure: Secondary | ICD-10-CM | POA: Diagnosis not present

## 2019-08-02 DIAGNOSIS — E782 Mixed hyperlipidemia: Secondary | ICD-10-CM | POA: Insufficient documentation

## 2019-08-02 DIAGNOSIS — Z7901 Long term (current) use of anticoagulants: Secondary | ICD-10-CM | POA: Insufficient documentation

## 2019-08-02 DIAGNOSIS — D472 Monoclonal gammopathy: Secondary | ICD-10-CM

## 2019-08-02 DIAGNOSIS — N183 Chronic kidney disease, stage 3 unspecified: Secondary | ICD-10-CM | POA: Insufficient documentation

## 2019-08-02 DIAGNOSIS — E119 Type 2 diabetes mellitus without complications: Secondary | ICD-10-CM | POA: Insufficient documentation

## 2019-08-02 DIAGNOSIS — Z79899 Other long term (current) drug therapy: Secondary | ICD-10-CM | POA: Insufficient documentation

## 2019-08-02 DIAGNOSIS — Z87891 Personal history of nicotine dependence: Secondary | ICD-10-CM | POA: Insufficient documentation

## 2019-08-02 DIAGNOSIS — C9000 Multiple myeloma not having achieved remission: Secondary | ICD-10-CM | POA: Diagnosis not present

## 2019-08-02 LAB — CBC WITH DIFFERENTIAL/PLATELET
Abs Immature Granulocytes: 0.02 10*3/uL (ref 0.00–0.07)
Basophils Absolute: 0 10*3/uL (ref 0.0–0.1)
Basophils Relative: 0 %
Eosinophils Absolute: 0.1 10*3/uL (ref 0.0–0.5)
Eosinophils Relative: 2 %
HCT: 35.5 % — ABNORMAL LOW (ref 39.0–52.0)
Hemoglobin: 11.5 g/dL — ABNORMAL LOW (ref 13.0–17.0)
Immature Granulocytes: 1 %
Lymphocytes Relative: 25 %
Lymphs Abs: 0.9 10*3/uL (ref 0.7–4.0)
MCH: 32.9 pg (ref 26.0–34.0)
MCHC: 32.4 g/dL (ref 30.0–36.0)
MCV: 101.4 fL — ABNORMAL HIGH (ref 80.0–100.0)
Monocytes Absolute: 0.4 10*3/uL (ref 0.1–1.0)
Monocytes Relative: 11 %
Neutro Abs: 2.2 10*3/uL (ref 1.7–7.7)
Neutrophils Relative %: 61 %
Platelets: 82 10*3/uL — ABNORMAL LOW (ref 150–400)
RBC: 3.5 MIL/uL — ABNORMAL LOW (ref 4.22–5.81)
RDW: 14.6 % (ref 11.5–15.5)
WBC: 3.6 10*3/uL — ABNORMAL LOW (ref 4.0–10.5)
nRBC: 0 % (ref 0.0–0.2)

## 2019-08-02 LAB — BASIC METABOLIC PANEL
Anion gap: 8 (ref 5–15)
BUN: 33 mg/dL — ABNORMAL HIGH (ref 8–23)
CO2: 25 mmol/L (ref 22–32)
Calcium: 9.3 mg/dL (ref 8.9–10.3)
Chloride: 105 mmol/L (ref 98–111)
Creatinine, Ser: 1.69 mg/dL — ABNORMAL HIGH (ref 0.61–1.24)
GFR calc Af Amer: 47 mL/min — ABNORMAL LOW (ref >60)
GFR calc non Af Amer: 41 mL/min — ABNORMAL LOW (ref >60)
Glucose, Bld: 163 mg/dL — ABNORMAL HIGH (ref 70–99)
Potassium: 4.1 mmol/L (ref 3.5–5.1)
Sodium: 138 mmol/L (ref 135–145)

## 2019-08-03 LAB — KAPPA/LAMBDA LIGHT CHAINS
Kappa free light chain: 948.2 mg/L — ABNORMAL HIGH (ref 3.3–19.4)
Kappa, lambda light chain ratio: 27.33 — ABNORMAL HIGH (ref 0.26–1.65)
Lambda free light chains: 34.7 mg/L — ABNORMAL HIGH (ref 5.7–26.3)

## 2019-08-03 LAB — IGG, IGA, IGM
IgA: 416 mg/dL (ref 61–437)
IgG (Immunoglobin G), Serum: 3589 mg/dL — ABNORMAL HIGH (ref 603–1613)
IgM (Immunoglobulin M), Srm: 73 mg/dL (ref 20–172)

## 2019-08-04 LAB — PROTEIN ELECTROPHORESIS, SERUM
A/G Ratio: 0.8 (ref 0.7–1.7)
Albumin ELP: 3.9 g/dL (ref 2.9–4.4)
Alpha-1-Globulin: 0.3 g/dL (ref 0.0–0.4)
Alpha-2-Globulin: 0.6 g/dL (ref 0.4–1.0)
Beta Globulin: 1.2 g/dL (ref 0.7–1.3)
Gamma Globulin: 3.2 g/dL — ABNORMAL HIGH (ref 0.4–1.8)
Globulin, Total: 5.2 g/dL — ABNORMAL HIGH (ref 2.2–3.9)
M-Spike, %: 2.6 g/dL — ABNORMAL HIGH
Total Protein ELP: 9.1 g/dL — ABNORMAL HIGH (ref 6.0–8.5)

## 2019-08-05 NOTE — Progress Notes (Signed)
[place]  [phone]) [phone] [phone]) [phone]  ID: [name] OB: Nov 08, 1950  MR#: [id number]  SWF#:[id number]  Patient Care Team: Albina Billet, MD as PCP - General (Internal Medicine) Minna Merritts, MD as PCP - Cardiology (Cardiology) Lloyd Huger, MD as Consulting Physician (Oncology)  CHIEF COMPLAINT: Smoldering myeloma.  INTERVAL HISTORY: Patient returns to clinic today for routine 77-monthevaluation and discussion of his laboratory results. He continues to have significant cardiac issues which are improving, but has elected for medical management only at this time. He has no neurologic complaints. He denies any recent fevers or illnesses. He has a fair appetite.  He denies any chest pain, shortness of breath, cough, or hemoptysis.  He denies any nausea, vomiting, constipation, or diarrhea.  He has no melena or hematochezia.  He has no urinary complaints. His peripheral edema has nearly resolved. Patient offers no further specific complaints today.   REVIEW OF SYSTEMS:   Review of Systems  Constitutional: Positive for malaise/fatigue. Negative for fever.  Respiratory: Negative.  Negative for cough, hemoptysis and shortness of breath.   Cardiovascular: Negative.  Negative for chest pain and leg swelling.  Gastrointestinal: Negative.  Negative for abdominal pain.  Genitourinary: Negative.  Negative for dysuria.  Musculoskeletal: Negative.  Negative for back pain.  Skin: Negative.  Negative for rash.  Neurological: Positive for weakness. Negative for dizziness, focal weakness and headaches.  Psychiatric/Behavioral: Negative.  The patient is not nervous/anxious.     As per HPI. Otherwise, a complete review of systems is negative.  PAST MEDICAL HISTORY: Past Medical History:  Diagnosis Date  . (HFpEF) heart failure with preserved ejection fraction ([place]    a. 03/2019 cMRI: EF 30-40%, glob HK; b. 05/2019 Echo ([facility]: EF 35%.  . CKD (chronic  kidney disease), stage III   . Diabetes mellitus without complication ([place]   . Fatty liver   . H/O ETOH abuse   . Hx of adenomatous polyp of colon   . Hx of thrombocytopenia   . Hypertension   . Macrocytic anemia   . Mixed hyperlipidemia   . NICM (nonischemic cardiomyopathy) ([place]    a. 03/2019 cMRI: EF 30-40%, glob HK. No LGE. No evidence of infiltrative cardiomyopathy; b. 05/2019 Echo ([facility]: EF 35%, mild to mod MR, sev dil LA, sev dil RV w/ mod reduced RV fxn. Sev TR. Mild-mod PAH (PASP 453mg), sev dil RA.  . Marland Kitchenonobstructive CAD (coronary artery disease)    a. [facility] LAD 4063m1 small, D2 60ost, D3 small, LCX nl, OM2 25, RCA 67m66md.  . PeMarland Kitchensistent atrial fibrillation (HCC)[place] a. CHA2DS2VASc = 5-->eliquis; b. [facility] Valvular heart disease    [facility]. [facility], sev TR.    PAST SURGICAL HISTORY: Past Surgical History:  Procedure Laterality Date  . CARDIAC CATHETERIZATION     no stents  . COLONOSCOPY    . COLONOSCOPY WITH PROPOFOL N/A 07/26/2015   Procedure: COLONOSCOPY WITH PROPOFOL;  Surgeon: RobeManya Silvas;  [facility];  Service: Endoscopy;  Laterality: N/A;  . HAMMER TOE SURGERY      FAMILY HISTORY: Family History  Problem Relation Age of Onset  . Heart Problems Mother   . Heart Problems Father   . Heart Problems Sister     ADVANCED DIRECTIVES (Y/N):  N  HEALTH MAINTENANCE: Social History   Tobacco Use  . Smoking status: Former [employer] scientist (life sciences)  Smokeless tobacco: Current User    Types: Snuff  Vaping Use  . Vaping Use: Never used  Substance Use Topics  . Alcohol use: No  . Drug use: No     Colonoscopy:  PAP:  Bone density:  Lipid panel:  Allergies  Allergen Reactions  . Penicillins Hives    Current Outpatient Medications  Medication Sig Dispense Refill  . apixaban (ELIQUIS) 5 MG TABS tablet Take by mouth 2 (two) times daily.     [name] atorvastatin (LIPITOR) 10 MG tablet Take 10 mg  by mouth daily.    . clobetasol cream (TEMOVATE) 1.10 % Apply 1 application topically 2 (two) times daily.    . colchicine 0.6 MG tablet Take by mouth.    . gabapentin (NEURONTIN) 300 MG capsule Take 300 mg by mouth 3 (three) times daily.     [name] lactobacillus acidophilus (BACID) TABS tablet Take 2 tablets by mouth 3 (three) times daily. 30 tablet 0  . lactulose (CHRONULAC) 10 GM/15ML solution     . levothyroxine (SYNTHROID, LEVOTHROID) 100 MCG tablet Take 100 mcg by mouth every morning.  4  . metoprolol succinate (TOPROL-XL) 25 MG 24 hr tablet Take 1.5 tablets (37.5 mg total) by mouth daily. 45 tablet 11  . pantoprazole (PROTONIX) 20 MG tablet Take 40 mg by mouth 2 (two) times daily.     . potassium chloride SA (KLOR-CON) 20 MEQ tablet Take 1 tablet (20 mEq total) by mouth as directed. Take 1 tablet (20 mEq) once daily. Hold on Monday/Wednesdy/Friday 180 tablet 3  . sacubitril-valsartan (ENTRESTO) 24-26 MG Take 1 tablet by mouth at bedtime. 30 tablet 1  . sucralfate (CARAFATE) 1 g tablet Take 1 g by mouth 4 (four) times daily -  with meals and at bedtime.    . torsemide (DEMADEX) 20 MG tablet Take 1 tablet (20 mg total) by mouth as directed. Hold on Monday/Wednesdy/Friday 90 tablet 3   No current facility-administered medications for this visit.    OBJECTIVE: Vitals:   08/08/19 1051  BP: 113/69  Pulse: 79  Resp: 18  Temp: (!) 97.5 F (36.4 C)     Body mass index is 27.54 kg/m.    ECOG FS:1 - Symptomatic but completely ambulatory  General: Well-developed, well-nourished, no acute distress. Eyes: Pink conjunctiva, anicteric sclera. HEENT: Normocephalic, moist mucous membranes. Lungs: No audible wheezing or coughing. Heart: Regular rate and rhythm. Abdomen: Soft, nontender, no obvious distention. Musculoskeletal: No edema, cyanosis, or clubbing. Neuro: Alert, answering all questions appropriately. Cranial nerves grossly intact. Skin: No rashes or petechiae noted. Psych: Normal  affect.   LAB RESULTS:  Lab Results  Component Value Date   NA 138 08/02/2019   K 4.1 08/02/2019   CL 105 08/02/2019   CO2 25 08/02/2019   GLUCOSE 163 (H) 08/02/2019   BUN 33 (H) 08/02/2019   CREATININE 1.69 (H) 08/02/2019   CALCIUM 9.3 08/02/2019   PROT 9.3 (H) 05/02/2019   ALBUMIN 3.2 (L) 05/02/2019   AST 36 05/02/2019   ALT 18 05/02/2019   ALKPHOS 167 (H) 05/02/2019   BILITOT 1.1 05/02/2019   GFRNONAA 41 (L) 08/02/2019   GFRAA 47 (L) 08/02/2019    Lab Results  Component Value Date   WBC 3.6 (L) 08/02/2019   NEUTROABS 2.2 08/02/2019   HGB 11.5 (L) 08/02/2019   HCT 35.5 (L) 08/02/2019   MCV 101.4 (H) 08/02/2019   PLT 82 (L) 08/02/2019   Lab Results  Component Value Date   TOTALPROTELP 9.1 (H) 08/02/2019  ALBUMINELP 3.9 08/02/2019   A1GS 0.3 08/02/2019   A2GS 0.6 08/02/2019   BETS 1.2 08/02/2019   GAMS 3.2 (H) 08/02/2019   MSPIKE 2.6 (H) 08/02/2019   SPEI Comment 08/02/2019      STUDIES: No results found.  ASSESSMENT: Smoldering myeloma  PLAN:    1.  Smoldering myeloma: Bone marrow biopsy completed at [facility] on February 14, 2019 revealed approximately 26% plasma cells in aspirate.  FISH and cytogenetics are unknown at this time. Metastatic bone survey on January 09, 2019 did not reveal any suspicious bony lesions. His most recent M spike and IgG component are elevated, but essentially unchanged at 2.6 and 3589 respectively. Kappa free light chains are also essentially stable at 948.2. His platelet count has trended down to 82, but his hemoglobin has improved to 11.5. Can consider PET scan in the near future to look for occult lesions otherwise, no intervention is needed at this time. No treatment is needed at this time, but patient expressed understanding that he may require intervention in the future. Return to clinic in 4 months with repeat laboratory work and further evaluation. 2.  Thrombocytopenia: But continued to trend down and are 82. Patient noted to  have a decreased platelet count since at least July 2017. Continue to hold treatment as above. 3.  Hyperbilirubinemia: Patient's most recent laboratory work at an outside facility revealed a bilirubin of 1.8.  Unclear etiology.  Continue to monitor. 4.  Anemia: Improved, monitor. 5.  Renal insufficiency: Creatinine slightly worse at 1.69. Monitor. 6.  Cellulitis: Resolved. 7.  Cardiac disease: Patient has elected for medical management only. Continue follow-up and treatment per cardiology.    Patient expressed understanding and was in agreement with this plan. He also understands that He can call clinic at any time with any questions, concerns, or complaints.   Cancer Staging No matching staging information was found for the patient.  Lloyd Huger, MD   08/10/2019 9:16 AM

## 2019-08-06 NOTE — Progress Notes (Signed)
Cardiology Office Note  Date:  08/07/2019   ID:  [name], [place] 07-Jun-1950, MRN [id number]  PCP:  Albina Billet, MD   Chief Complaint  Patient presents with  . Other    Pre-Op Medication Clearance; Medication list verbally reviewed with patient    HPI:  Mr. Makye [name] is a 69 year old gentleman with past medical history of Nonischemic cardiomyopathy Chronic systolic CHF CKD Cirrhosis, NASH, etoh Atrial fibrillation, s/p DCCV 02/16/19 LHC 02/15/19 demonstrated 60% ostial D2 stenosis.  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 ,treated with keflex elevated ammonia of 82, likely hepatic encephalopathy.     History of EtOH, NASH At d/c , diuretics were held, [place] held (presumably for elevated creatinine)   in our office 04/10/19, labs done, creatinine improved Was not restarted on diuretic  Developed massive fluid retention, abdominal distention, leg edema, up 20 pounds Torsemide restarted 40 twice daily 5 days then down to 20 twice daily Entresto restarted   Cardiac MRI  no infiltrative disease, ejection fraction up to 30 to 40%  05/02/2019: seen by Dr. Caryl Comes EP: Recommendation to proceed with A. fib ablation CR 1.38 to  1.9, BUN 39 Torsemide dosing decreased down to 20 daily  Seen by wound clinic, infection left calf area bx taken by derm, infectious disease Started on clindamycin 300 QID   afib ablation at [facility] with TEE 05/2019  Seen 05/24/2019: by myself in clinic Massive weight loss over 2 months from diuresis cut back torsemide to 4 days a week with potassium  Entresto continued tremendous frequency of bowel movements on his  lactulose  maintaining normal sinus rhythm  Lab work reviewed Labs 08/02/2019: CR 1.69, BUN 33 HCT 35  Seen in clinic 07/14/2019: elevated heart rate at home, rate 116 bpm ekg concerning for atrial fib flutter Metoprolol increased up to 37.5 daily Suggested to f/u with EP at [facility]  Notes reviewed from [place] about repeat ablation versus cardioversion Was back in atrial fibrillation on that visit  On further discussion today, he does not want any more procedures, does not want cardioversion Reports that he feels well, denies any significant shortness of breath on exertion, no leg edema Feels well on torsemide 4 days a week  Reports his weight has been stable  EKG personally reviewed by myself on todays visit Shows atrial fibrillation rate 78 bpm PVCs suspect right bundle branch block  Other past medical history reviewed Prior admission at [facility] January 2021 for decompensated heart failure, acute on chronic systolic CHF Echocardiogram confirm ejection fraction 25 to 30% moderate mitral valve regurgitation reduced RV function and severe TR  Left heart catheterization with nonobstructive disease  Transition to [facility], Had a cardioversion for atrial fibrillation normal sinus rhythm restored Notes indicating prior longstanding atrial fibrillation Maintained on Eliquis Digoxin discontinued Following cardioversion initially with junctional bradycardia, slow return of sinus node function  Baseline weight appears 213 pounds, discharged on Lasix 40 daily Discharged with a LifeVest given nonsustained VT during admission, was referred to EP  On last clinic visit March 28, 2019, had  life vest in place    PMH:   has a  past medical history of (HFpEF) heart failure with preserved ejection fraction ([place]), CKD (chronic kidney disease), stage III, Diabetes mellitus without complication ([place]), Fatty liver, H/O ETOH abuse, adenomatous polyp of colon, thrombocytopenia, Hypertension,  Macrocytic anemia, Mixed hyperlipidemia, NICM (nonischemic cardiomyopathy) ([place]), Nonobstructive CAD (coronary artery disease), Persistent atrial fibrillation ([place]), and Valvular heart disease.  PSH:    Past Surgical History:  Procedure Laterality Date  . CARDIAC CATHETERIZATION     no stents  . COLONOSCOPY    . COLONOSCOPY WITH PROPOFOL N/A 07/26/2015   Procedure: COLONOSCOPY WITH PROPOFOL;  Surgeon: Manya Silvas, MD;  Location: [facility] ENDOSCOPY;  Service: Endoscopy;  Laterality: N/A;  . HAMMER TOE SURGERY      Current Outpatient Medications  Medication Sig Dispense Refill  . apixaban (ELIQUIS) 5 MG TABS tablet Take by mouth 2 (two) times daily.     [name] atorvastatin (LIPITOR) 10 MG tablet Take 10 mg by mouth daily.    . clobetasol cream (TEMOVATE) 9.14 % Apply 1 application topically 2 (two) times daily.    . colchicine 0.6 MG tablet Take by mouth.    . gabapentin (NEURONTIN) 300 MG capsule Take 300 mg by mouth 3 (three) times daily.     [name] lactobacillus acidophilus (BACID) TABS tablet Take 2 tablets by mouth 3 (three) times daily. 30 tablet 0  . lactulose (CHRONULAC) 10 GM/15ML solution     . levothyroxine (SYNTHROID, LEVOTHROID) 100 MCG tablet Take 100 mcg by mouth every morning.  4  . metoprolol succinate (TOPROL-XL) 25 MG 24 hr tablet Take 1.5 tablets (37.5 mg total) by mouth daily. 45 tablet 11  . pantoprazole (PROTONIX) 20 MG tablet Take 40 mg by mouth 2 (two) times daily.     . potassium chloride SA (KLOR-CON) 20 MEQ tablet Take 1 tablet (20 mEq total) by mouth as directed. Take 1 tablet (20 mEq) once daily. Hold on Monday/Wednesdy/Friday 180 tablet 3  . sacubitril-valsartan (ENTRESTO) 24-26 MG Take 1 tablet by mouth at bedtime. 30 tablet 1  . sucralfate (CARAFATE) 1 g tablet Take 1 g by mouth 4 (four) times daily -  with meals and at bedtime.    . torsemide (DEMADEX) 20 MG tablet Take 1 tablet (20 mg total) by mouth as directed. Hold on Monday/Wednesdy/Friday 90 tablet 3   No  current facility-administered medications for this visit.    Allergies:   Penicillins   Social History:  The patient  reports that he has quit smoking. His smokeless tobacco use includes snuff. He reports that he does not drink alcohol and does not use drugs.   Family History:   family history includes Heart Problems in his father, mother, and sister.   Review of Systems: Review of Systems  Constitutional: Negative.   HENT: Negative.   Respiratory: Negative.   Cardiovascular: Negative.   Gastrointestinal: Negative.   Musculoskeletal: Negative.   Neurological: Negative.   Psychiatric/Behavioral: Negative.   All other systems reviewed and are negative.   PHYSICAL EXAM: VS:  BP 100/64 (BP Location: Left Arm, Patient Position: Sitting, Cuff Size: Normal)   Pulse 78   Ht '6\' 2"'$  (1.88 m)   Wt 213 lb 12.8 oz (97 kg)   SpO2 98%   BMI 27.45 kg/m  , BMI Body mass index is 27.45 kg/m. Constitutional:  oriented to person, place, and time. No distress.  HENT:  Head: Grossly normal Eyes:  no discharge. No scleral icterus.  Neck: No JVD, no carotid bruits  Cardiovascular: Regular rate and rhythm,  no murmurs appreciated Pulmonary/Chest: Clear to auscultation bilaterally, no wheezes or rails Abdominal: Soft.  no distension.  no tenderness.  Musculoskeletal: Normal range of motion Left lower extremity in a wrap below the knee Neurological:  normal muscle tone. Coordination normal. No atrophy Skin: Skin warm and dry Psychiatric: normal affect, pleasant   Recent Labs: 04/05/2019: TSH 2.342 04/10/2019: Magnesium 2.4 05/02/2019: ALT 18 08/02/2019: BUN 33; Creatinine, Ser 1.69; Hemoglobin 11.5; Platelets 82; Potassium 4.1; Sodium 138    Lipid Panel No results found for: CHOL, HDL, LDLCALC, TRIG    Wt Readings from Last 3 Encounters:  08/07/19 213 lb 12.8 oz (97 kg)  07/14/19 213 lb (96.6 kg)  06/06/19 209 lb 8 oz (95 kg)     ASSESSMENT AND PLAN:  Problem List Items Addressed This  Visit      Cardiology Problems   Persistent atrial fibrillation (HCC)   Essential hypertension    Other Visit Diagnoses    Chronic systolic CHF (congestive heart failure) (HCC)    -  Primary   NICM (nonischemic cardiomyopathy) (HCC)       Hyperlipidemia LDL goal <70       Coronary artery disease of native artery of native heart with stable angina pectoris (HCC)       Relevant Medications   gabapentin (NEURONTIN) 300 MG capsule   NSVT (nonsustained ventricular tachycardia) (HCC)         Nonischemic cardiomyopathy  MRI no infiltrative disease, ejection fraction up to 30 to 40% Recommended he continue his metoprolol, Entresto, torsemide Blood pressure low limiting addition of other medications or increasing doses Reports he feels well with no complaints Weight stable  Atrial fibrillation, persistent Recent ablation, Did not hold, converted back to atrial fibrillation Seen by [facility] EP, Discussed options with him as detailed by the note from EP at [facility] In follow-up today he has decided against proceeding with cardioversion, or repeat ablation Reports he feels well with no complaints, prefers medical management at this time  Chronic systolic CHF Taking torsemide 4 days/week Weight stable, No medication changes made as above   Total encounter time more than 25 minutes  Greater than 50% was spent in counseling and coordination of care with the patient    Signed, Esmond Plants, M.D., Ph.D. [place], [place]

## 2019-08-07 ENCOUNTER — Ambulatory Visit (INDEPENDENT_AMBULATORY_CARE_PROVIDER_SITE_OTHER): Payer: PPO | Admitting: Cardiovascular Disease

## 2019-08-07 ENCOUNTER — Other Ambulatory Visit: Payer: Self-pay

## 2019-08-07 ENCOUNTER — Encounter: Payer: Self-pay | Admitting: Cardiovascular Disease

## 2019-08-07 VITALS — BP 100/64 | HR 78 | Ht 74.0 in | Wt 213.8 lb

## 2019-08-07 DIAGNOSIS — I4819 Other persistent atrial fibrillation: Secondary | ICD-10-CM | POA: Diagnosis not present

## 2019-08-07 DIAGNOSIS — I5022 Chronic systolic (congestive) heart failure: Secondary | ICD-10-CM | POA: Diagnosis not present

## 2019-08-07 DIAGNOSIS — E785 Hyperlipidemia, unspecified: Secondary | ICD-10-CM | POA: Diagnosis not present

## 2019-08-07 DIAGNOSIS — I4729 Other ventricular tachycardia: Secondary | ICD-10-CM

## 2019-08-07 DIAGNOSIS — I10 Essential (primary) hypertension: Secondary | ICD-10-CM

## 2019-08-07 DIAGNOSIS — I25118 Atherosclerotic heart disease of native coronary artery with other forms of angina pectoris: Secondary | ICD-10-CM | POA: Diagnosis not present

## 2019-08-07 DIAGNOSIS — I428 Other cardiomyopathies: Secondary | ICD-10-CM

## 2019-08-07 DIAGNOSIS — I472 Ventricular tachycardia: Secondary | ICD-10-CM

## 2019-08-07 NOTE — Patient Instructions (Addendum)
Please monitor heart rate  If it runs I the 40s all the time, call the office   Medication Instructions:  No changes  If you need a refill on your cardiac medications before your next appointment, please call your pharmacy.    Lab work: No new labs needed   If you have labs (blood work) drawn today and your tests are completely normal, you will receive your results only by: [name] MyChart Message (if you have MyChart) OR . A paper copy in the mail If you have any lab test that is abnormal or we need to change your treatment, we will call you to review the results.   Testing/Procedures: No new testing needed   Follow-Up: At [facility], you and your health needs are our priority.  As part of our continuing mission to provide you with exceptional heart care, we have created designated Provider Care Teams.  These Care Teams include your primary Cardiologist (physician) and Advanced Practice Providers (APPs -  Physician Assistants and Nurse Practitioners) who all work together to provide you with the care you need, when you need it.  . You will need a follow up appointment in 3 months   . Providers on your designated Care Team:   . Murray Hodgkins, NP . Christell Faith, PA-C . Marrianne Mood, PA-C  Any Other Special Instructions Will Be Listed Below (If Applicable).  For educational health videos Log in to : [email or web address] Or : [email or web address], password : [email or web address]

## 2019-08-08 ENCOUNTER — Inpatient Hospital Stay (HOSPITAL_BASED_OUTPATIENT_CLINIC_OR_DEPARTMENT_OTHER): Payer: PPO | Admitting: Oncology

## 2019-08-08 ENCOUNTER — Encounter: Payer: Self-pay | Admitting: Oncology

## 2019-08-08 VITALS — BP 113/69 | HR 79 | Temp 97.5°F | Resp 18 | Wt 214.5 lb

## 2019-08-08 DIAGNOSIS — C9000 Multiple myeloma not having achieved remission: Secondary | ICD-10-CM

## 2019-08-08 DIAGNOSIS — D472 Monoclonal gammopathy: Secondary | ICD-10-CM

## 2019-08-08 NOTE — Progress Notes (Signed)
Pt in for follow up, denies any concerns today. 

## 2019-08-08 NOTE — Addendum Note (Signed)
Addended by: Othelia Pulling C on: 08/08/2019 11:01 AM   Modules accepted: Orders

## 2019-08-09 ENCOUNTER — Ambulatory Visit: Payer: PPO | Admitting: Oncology

## 2019-08-21 DIAGNOSIS — C9000 Multiple myeloma not having achieved remission: Secondary | ICD-10-CM | POA: Diagnosis not present

## 2019-08-21 DIAGNOSIS — D696 Thrombocytopenia, unspecified: Secondary | ICD-10-CM | POA: Diagnosis not present

## 2019-08-21 DIAGNOSIS — Z86010 Personal history of colonic polyps: Secondary | ICD-10-CM | POA: Diagnosis not present

## 2019-08-21 DIAGNOSIS — I428 Other cardiomyopathies: Secondary | ICD-10-CM | POA: Diagnosis not present

## 2019-08-21 DIAGNOSIS — R1310 Dysphagia, unspecified: Secondary | ICD-10-CM | POA: Diagnosis not present

## 2019-08-21 DIAGNOSIS — I5022 Chronic systolic (congestive) heart failure: Secondary | ICD-10-CM | POA: Diagnosis not present

## 2019-08-21 DIAGNOSIS — R1013 Epigastric pain: Secondary | ICD-10-CM | POA: Diagnosis not present

## 2019-08-21 DIAGNOSIS — K7030 Alcoholic cirrhosis of liver without ascites: Secondary | ICD-10-CM | POA: Diagnosis not present

## 2019-08-21 DIAGNOSIS — I480 Paroxysmal atrial fibrillation: Secondary | ICD-10-CM | POA: Diagnosis not present

## 2019-08-27 ENCOUNTER — Other Ambulatory Visit: Payer: Self-pay | Admitting: Family

## 2019-08-27 DIAGNOSIS — I428 Other cardiomyopathies: Secondary | ICD-10-CM

## 2019-08-27 DIAGNOSIS — I5022 Chronic systolic (congestive) heart failure: Secondary | ICD-10-CM

## 2019-09-22 DIAGNOSIS — E119 Type 2 diabetes mellitus without complications: Secondary | ICD-10-CM | POA: Diagnosis not present

## 2019-09-22 DIAGNOSIS — E785 Hyperlipidemia, unspecified: Secondary | ICD-10-CM | POA: Diagnosis not present

## 2019-09-22 DIAGNOSIS — D649 Anemia, unspecified: Secondary | ICD-10-CM | POA: Diagnosis not present

## 2019-09-22 DIAGNOSIS — M1000 Idiopathic gout, unspecified site: Secondary | ICD-10-CM | POA: Diagnosis not present

## 2019-09-22 DIAGNOSIS — E039 Hypothyroidism, unspecified: Secondary | ICD-10-CM | POA: Diagnosis not present

## 2019-09-28 ENCOUNTER — Telehealth: Payer: Self-pay | Admitting: Cardiovascular Disease

## 2019-09-28 NOTE — Telephone Encounter (Signed)
   Primary Cardiologist: Ida Rogue, MD  Chart reviewed and patient contacted today by phone as part of pre-operative protocol coverage. Given past medical history and time since last visit, based on ACC/AHA guidelines, VAHE PIENTA would be at acceptable risk for the planned procedure without further cardiovascular testing.   Based on prior recommendations made in May 2021- OK to hold Eliquis 48 hours pre op and resume as soon as safe post op.  I discussed this with the patient and Dr Ricky Stabs nurse.   The patient was advised that if he develops new symptoms prior to surgery to contact our office to arrange for a follow-up visit, and he verbalized understanding.  I will route this recommendation to the requesting party via Epic fax function and remove from pre-op pool.  Please call with questions.  Kerin Ransom, PA-C 09/28/2019, 3:02 PM

## 2019-09-28 NOTE — Telephone Encounter (Signed)
   [facility] Pre-operative Risk Assessment    HEARTCARE STAFF: - Please ensure there is not already an duplicate clearance open for this procedure. - Under Visit Info/Reason for Call, type in Other and utilize the format Clearance MM/DD/YY or Clearance TBD. Do not use dashes or single digits. - If request is for dental extraction, please clarify the # of teeth to be extracted.  Request for surgical clearance:  1. What type of surgery is being performed? Colonoscopy and EGD  2. When is this surgery scheduled? 10/03/19  3. What type of clearance is required (medical clearance vs. Pharmacy clearance to hold med vs. Both)? pharm  4. Are there any medications that need to be held prior to surgery and how long? Advise of eliquis 5 mg hold 5 days prior  5. Practice name and name of physician performing surgery? [facility] by Dr. Madolyn Frieze  6. What is the office phone number? [phone]   7.   What is the office fax number? [phone]  8.   Anesthesia type (None, local, MAC, general) ? local   Marykay Lex 09/28/2019, 12:06 PM  _________________________________________________________________   (provider comments below)

## 2019-09-29 ENCOUNTER — Other Ambulatory Visit
Admission: RE | Admit: 2019-09-29 | Discharge: 2019-09-29 | Disposition: A | Discharge status: Home / Self Care | Payer: PPO | Source: Ambulatory Visit | Attending: Gastroenterology | Admitting: Gastroenterology

## 2019-09-29 ENCOUNTER — Other Ambulatory Visit: Payer: Self-pay

## 2019-09-29 DIAGNOSIS — Z20822 Contact with and (suspected) exposure to covid-19: Secondary | ICD-10-CM | POA: Diagnosis not present

## 2019-09-29 DIAGNOSIS — M1000 Idiopathic gout, unspecified site: Secondary | ICD-10-CM | POA: Diagnosis not present

## 2019-09-29 DIAGNOSIS — Z01812 Encounter for preprocedural laboratory examination: Secondary | ICD-10-CM | POA: Diagnosis not present

## 2019-09-29 DIAGNOSIS — E119 Type 2 diabetes mellitus without complications: Secondary | ICD-10-CM | POA: Diagnosis not present

## 2019-09-29 DIAGNOSIS — E785 Hyperlipidemia, unspecified: Secondary | ICD-10-CM | POA: Diagnosis not present

## 2019-09-29 DIAGNOSIS — E038 Other specified hypothyroidism: Secondary | ICD-10-CM | POA: Diagnosis not present

## 2019-09-30 LAB — SARS CORONAVIRUS 2 (TAT 6-24 HRS): SARS Coronavirus 2: NEGATIVE

## 2019-10-02 ENCOUNTER — Encounter: Payer: Self-pay | Admitting: *Deleted

## 2019-10-03 ENCOUNTER — Other Ambulatory Visit: Payer: Self-pay | Admitting: Gastroenterology

## 2019-10-03 ENCOUNTER — Encounter: Payer: Self-pay | Admitting: Anesthesiology

## 2019-10-03 ENCOUNTER — Other Ambulatory Visit: Payer: Self-pay

## 2019-10-03 ENCOUNTER — Ambulatory Visit: Payer: PPO | Admitting: Anesthesiology

## 2019-10-03 ENCOUNTER — Ambulatory Visit
Admission: RE | Admit: 2019-10-03 | Discharge: 2019-10-03 | Disposition: A | Discharge status: Home / Self Care | Payer: PPO | Attending: Gastroenterology | Admitting: Gastroenterology

## 2019-10-03 ENCOUNTER — Encounter
Admission: RE | Disposition: A | Discharge status: Home / Self Care | Payer: Self-pay | Source: Home / Self Care | Attending: Gastroenterology

## 2019-10-03 DIAGNOSIS — Z87891 Personal history of nicotine dependence: Secondary | ICD-10-CM | POA: Diagnosis not present

## 2019-10-03 DIAGNOSIS — Z1211 Encounter for screening for malignant neoplasm of colon: Secondary | ICD-10-CM | POA: Insufficient documentation

## 2019-10-03 DIAGNOSIS — Z7901 Long term (current) use of anticoagulants: Secondary | ICD-10-CM | POA: Diagnosis not present

## 2019-10-03 DIAGNOSIS — Z86010 Personal history of colonic polyps: Secondary | ICD-10-CM | POA: Diagnosis not present

## 2019-10-03 DIAGNOSIS — R1013 Epigastric pain: Secondary | ICD-10-CM | POA: Diagnosis present

## 2019-10-03 DIAGNOSIS — E039 Hypothyroidism, unspecified: Secondary | ICD-10-CM | POA: Insufficient documentation

## 2019-10-03 DIAGNOSIS — Z880 Allergy status to penicillin: Secondary | ICD-10-CM | POA: Insufficient documentation

## 2019-10-03 DIAGNOSIS — I509 Heart failure, unspecified: Secondary | ICD-10-CM | POA: Diagnosis not present

## 2019-10-03 DIAGNOSIS — E1122 Type 2 diabetes mellitus with diabetic chronic kidney disease: Secondary | ICD-10-CM | POA: Diagnosis not present

## 2019-10-03 DIAGNOSIS — D123 Benign neoplasm of transverse colon: Secondary | ICD-10-CM | POA: Diagnosis not present

## 2019-10-03 DIAGNOSIS — K317 Polyp of stomach and duodenum: Secondary | ICD-10-CM | POA: Insufficient documentation

## 2019-10-03 DIAGNOSIS — K5790 Diverticulosis of intestine, part unspecified, without perforation or abscess without bleeding: Secondary | ICD-10-CM | POA: Diagnosis not present

## 2019-10-03 DIAGNOSIS — K760 Fatty (change of) liver, not elsewhere classified: Secondary | ICD-10-CM | POA: Insufficient documentation

## 2019-10-03 DIAGNOSIS — I428 Other cardiomyopathies: Secondary | ICD-10-CM | POA: Insufficient documentation

## 2019-10-03 DIAGNOSIS — I864 Gastric varices: Secondary | ICD-10-CM | POA: Insufficient documentation

## 2019-10-03 DIAGNOSIS — Z79899 Other long term (current) drug therapy: Secondary | ICD-10-CM | POA: Diagnosis not present

## 2019-10-03 DIAGNOSIS — K219 Gastro-esophageal reflux disease without esophagitis: Secondary | ICD-10-CM | POA: Diagnosis not present

## 2019-10-03 DIAGNOSIS — R1310 Dysphagia, unspecified: Secondary | ICD-10-CM | POA: Diagnosis not present

## 2019-10-03 DIAGNOSIS — I130 Hypertensive heart and chronic kidney disease with heart failure and stage 1 through stage 4 chronic kidney disease, or unspecified chronic kidney disease: Secondary | ICD-10-CM | POA: Diagnosis not present

## 2019-10-03 DIAGNOSIS — I4891 Unspecified atrial fibrillation: Secondary | ICD-10-CM | POA: Insufficient documentation

## 2019-10-03 DIAGNOSIS — K2980 Duodenitis without bleeding: Secondary | ICD-10-CM | POA: Diagnosis not present

## 2019-10-03 DIAGNOSIS — E782 Mixed hyperlipidemia: Secondary | ICD-10-CM | POA: Diagnosis not present

## 2019-10-03 DIAGNOSIS — D126 Benign neoplasm of colon, unspecified: Secondary | ICD-10-CM | POA: Diagnosis not present

## 2019-10-03 DIAGNOSIS — D122 Benign neoplasm of ascending colon: Secondary | ICD-10-CM | POA: Insufficient documentation

## 2019-10-03 DIAGNOSIS — I2510 Atherosclerotic heart disease of native coronary artery without angina pectoris: Secondary | ICD-10-CM | POA: Insufficient documentation

## 2019-10-03 DIAGNOSIS — K7290 Hepatic failure, unspecified without coma: Secondary | ICD-10-CM | POA: Insufficient documentation

## 2019-10-03 DIAGNOSIS — K640 First degree hemorrhoids: Secondary | ICD-10-CM | POA: Diagnosis not present

## 2019-10-03 DIAGNOSIS — N183 Chronic kidney disease, stage 3 unspecified: Secondary | ICD-10-CM | POA: Insufficient documentation

## 2019-10-03 DIAGNOSIS — K2100 Gastro-esophageal reflux disease with esophagitis, without bleeding: Secondary | ICD-10-CM | POA: Insufficient documentation

## 2019-10-03 DIAGNOSIS — K228 Other specified diseases of esophagus: Secondary | ICD-10-CM | POA: Diagnosis not present

## 2019-10-03 DIAGNOSIS — I5032 Chronic diastolic (congestive) heart failure: Secondary | ICD-10-CM | POA: Diagnosis not present

## 2019-10-03 HISTORY — DX: Cardiac arrhythmia, unspecified: I49.9

## 2019-10-03 HISTORY — DX: Hypothyroidism, unspecified: E03.9

## 2019-10-03 HISTORY — DX: Hepatic encephalopathy: K76.82

## 2019-10-03 HISTORY — DX: Coagulation defect, unspecified: D68.9

## 2019-10-03 HISTORY — PX: COLONOSCOPY WITH PROPOFOL: SHX5780

## 2019-10-03 HISTORY — PX: ESOPHAGOGASTRODUODENOSCOPY (EGD) WITH PROPOFOL: SHX5813

## 2019-10-03 SURGERY — COLONOSCOPY WITH PROPOFOL
Anesthesia: General

## 2019-10-03 MED ORDER — PROPOFOL 500 MG/50ML IV EMUL
INTRAVENOUS | Status: AC
  Filled 2019-10-03: qty 50

## 2019-10-03 MED ORDER — PROPOFOL 500 MG/50ML IV EMUL
INTRAVENOUS | Status: DC | PRN
  Administered 2019-10-03: 120 ug/kg/min via INTRAVENOUS

## 2019-10-03 MED ORDER — PHENYLEPHRINE HCL (PRESSORS) 10 MG/ML IV SOLN
INTRAVENOUS | Status: AC
  Filled 2019-10-03: qty 1

## 2019-10-03 MED ORDER — LIDOCAINE HCL (CARDIAC) PF 100 MG/5ML IV SOSY
PREFILLED_SYRINGE | INTRAVENOUS | Status: DC | PRN
  Administered 2019-10-03: 50 mg via INTRAVENOUS

## 2019-10-03 MED ORDER — LIDOCAINE HCL (PF) 2 % IJ SOLN
INTRAMUSCULAR | Status: AC
  Filled 2019-10-03: qty 5

## 2019-10-03 MED ORDER — SODIUM CHLORIDE 0.9 % IV SOLN: INTRAVENOUS | Status: DC

## 2019-10-03 MED ORDER — PROPOFOL 10 MG/ML IV BOLUS
INTRAVENOUS | Status: AC
  Filled 2019-10-03: qty 20

## 2019-10-03 MED ORDER — PHENYLEPHRINE HCL (PRESSORS) 10 MG/ML IV SOLN
INTRAVENOUS | Status: DC | PRN
  Administered 2019-10-03 (×3): 50 ug via INTRAVENOUS

## 2019-10-03 NOTE — Interval H&P Note (Signed)
History and Physical Interval Note:  10/03/2019 9:28 AM  [name]  has presented today for surgery, with the diagnosis of [place].  The various methods of treatment have been discussed with the patient and family. After consideration of risks, benefits and other options for treatment, the patient has consented to  Procedure(s): COLONOSCOPY WITH PROPOFOL (N/A) ESOPHAGOGASTRODUODENOSCOPY (EGD) WITH PROPOFOL (N/A) as a surgical intervention.  The patient's history has been reviewed, patient examined, no change in status, stable for surgery.  I have reviewed the patient's chart and labs.  Questions were answered to the patient's satisfaction.     Lesly Rubenstein  Ok to proceed with EGD/Colonoscopy

## 2019-10-03 NOTE — Interval H&P Note (Signed)
History and Physical Interval Note:  10/03/2019 9:28 AM  [name]  has presented today for surgery, with the diagnosis of [place].  The various methods of treatment have been discussed with the patient and family. After consideration of risks, benefits and other options for treatment, the patient has consented to  Procedure(s): COLONOSCOPY WITH PROPOFOL (N/A) ESOPHAGOGASTRODUODENOSCOPY (EGD) WITH PROPOFOL (N/A) as a surgical intervention.  The patient's history has been reviewed, patient examined, no change in status, stable for surgery.  I have reviewed the patient's chart and labs.  Questions were answered to the patient's satisfaction.     Lesly Rubenstein  Ok to proceed with colonoscopy

## 2019-10-03 NOTE — H&P (Signed)
Outpatient short stay form Pre-procedure 10/03/2019 9:24 AM Raylene Miyamoto MD, MPH  Primary Physician:  Dr. Hall Busing  Reason for visit:  Variceal Screening, History of colon polyps  History of present illness:   69 y/o gentleman with history of colon polyps and dysphagia that resolved with PPI. Also with imaging and lab findings concerning for cirrhosis presumed to be alcoholic. Last took eliquis on Friday. No abdominal surgeries.   No current facility-administered medications for this encounter.  Medications Prior to Admission  Medication Sig Dispense Refill Last Dose  . atorvastatin (LIPITOR) 10 MG tablet Take 10 mg by mouth daily.   10/03/2019 at Unknown time  . levothyroxine (SYNTHROID, LEVOTHROID) 100 MCG tablet Take 100 mcg by mouth every morning.  4 10/03/2019 at Unknown time  . metoprolol succinate (TOPROL-XL) 25 MG 24 hr tablet Take 1.5 tablets (37.5 mg total) by mouth daily. 45 tablet 11 10/03/2019 at Unknown time  . pantoprazole (PROTONIX) 20 MG tablet Take 40 mg by mouth 2 (two) times daily.    10/03/2019 at Unknown time  . potassium chloride SA (KLOR-CON) 20 MEQ tablet Take 1 tablet (20 mEq total) by mouth as directed. Take 1 tablet (20 mEq) once daily. Hold on Monday/Wednesdy/Friday 180 tablet 3 10/02/2019 at Unknown time  . apixaban (ELIQUIS) 5 MG TABS tablet Take by mouth 2 (two) times daily.    09/29/2019  . clobetasol cream (TEMOVATE) 9.21 % Apply 1 application topically 2 (two) times daily.     . colchicine 0.6 MG tablet Take by mouth.     . ENTRESTO 24-26 MG TAKE 1 TABLET BY MOUTH EVERYDAY AT BEDTIME 30 tablet 1   . gabapentin (NEURONTIN) 300 MG capsule Take 300 mg by mouth 3 (three) times daily.      [name] lactobacillus acidophilus (BACID) TABS tablet Take 2 tablets by mouth 3 (three) times daily. 30 tablet 0   . lactulose (CHRONULAC) 10 GM/15ML solution      . sucralfate (CARAFATE) 1 g tablet Take 1 g by mouth 4 (four) times daily -  with meals and at bedtime.     . torsemide  (DEMADEX) 20 MG tablet Take 1 tablet (20 mg total) by mouth as directed. Hold on Monday/Wednesdy/Friday 90 tablet 3      Allergies  Allergen Reactions  . Penicillins Hives     Past Medical History:  Diagnosis Date  . (HFpEF) heart failure with preserved ejection fraction ([place])    a. 03/2019 cMRI: EF 30-40%, glob HK; b. 05/2019 Echo [facility]): EF 35%.  . CHF (congestive heart failure) ([place])   . CKD (chronic kidney disease), stage III   . 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 Diabetes mellitus without complication ([place])   . Dysrhythmia    A. Fib  . Fatty liver   . H/O ETOH abuse   . Hepatic encephalopathy ([place])    history  . Hx of adenomatous polyp of colon   . Hx of thrombocytopenia   . Hypertension   . Hypothyroidism   . Macrocytic anemia   . Mixed hyperlipidemia   . NICM (nonischemic cardiomyopathy) ([place])    a. 03/2019 cMRI: EF 30-40%, glob HK. No LGE. No evidence of infiltrative cardiomyopathy; b. 05/2019 Echo [facility]): EF 35%, mild to mod MR, sev dil LA, sev dil RV w/ mod reduced RV fxn. Sev TR. Mild-mod PAH (PASP 51mmHg), sev dil RA.  [name] Nonobstructive CAD (coronary artery disease)    a. 02/2019 Cath [facility]): LM nl, LAD 33m, D1 small, D2 60ost,  D3 small, LCX nl, OM2 25, RCA 63m, 25d.  [name] Persistent atrial fibrillation ([place])    a. CHA2DS2VASc = 5-->eliquis; b. 05/2019 s/p RFCA/PVI [facility]).  . Valvular heart disease    a. 05/2019 Echo [facility]): mild to mod MR, sev TR.    Review of systems:  Otherwise negative.    Physical Exam  Gen: Alert, oriented. Appears stated age.  HEENT: [place]/AT. PERRLA. Lungs: No respiratory distress Abd: soft, benign, no masses Ext: No edema.     Planned procedures: Proceed with EGD/colonoscopy. The patient understands the nature of the planned procedure, indications, risks, alternatives and potential complications including but not limited to bleeding, infection, perforation, damage to internal organs and possible oversedation/side effects from anesthesia. The patient  agrees and gives consent to proceed.  Please refer to procedure notes for findings, recommendations and patient disposition/instructions.     Raylene Miyamoto MD, MPH Gastroenterology 10/03/2019  9:24 AM

## 2019-10-03 NOTE — Anesthesia Postprocedure Evaluation (Signed)
Anesthesia Post Note  Patient: [name]  Procedure(s) Performed: COLONOSCOPY WITH PROPOFOL (N/A ) ESOPHAGOGASTRODUODENOSCOPY (EGD) WITH PROPOFOL (N/A )  Patient location during evaluation: PACU Anesthesia Type: General Level of consciousness: awake and alert Pain management: pain level controlled Vital Signs Assessment: post-procedure vital signs reviewed and stable Respiratory status: spontaneous breathing, nonlabored ventilation and respiratory function stable Cardiovascular status: blood pressure returned to baseline and stable Postop Assessment: no apparent nausea or vomiting Anesthetic complications: no   No complications documented.   Last Vitals:  Vitals:   10/03/19 1045 10/03/19 1055  BP: 108/70 112/70  Pulse: 82 (!) 126  Resp: 16 15  Temp:    SpO2: 97% 100%    Last Pain:  Vitals:   10/03/19 1055  TempSrc:   PainSc: 0-No pain                 Brett Canales Rachit Grim

## 2019-10-03 NOTE — Op Note (Signed)
[facility] Gastroenterology Patient Name: Martyn Timme Procedure Date: 10/03/2019 9:23 AM MRN: [id number] Account #: [id number] Date of Birth: 1951/01/08 Admit Type: Outpatient Age: 69 Room: [facility] ENDO ROOM 3 Gender: Male Note Status: Finalized Procedure:             Colonoscopy Indications:           High risk colon cancer surveillance: Personal history                         of adenoma (10 mm or greater in size) Providers:             Andrey Farmer MD, MD Referring MD:          Minna Merritts, MD (Referring MD) Medicines:             Monitored Anesthesia Care Complications:         No immediate complications. Estimated blood loss:                         Minimal. Procedure:             Pre-Anesthesia Assessment:                        - Prior to the procedure, a History and Physical was                         performed, and patient medications and allergies were                         reviewed. The patient is competent. The risks and                         benefits of the procedure and the sedation options and                         risks were discussed with the patient. All questions                         were answered and informed consent was obtained.                         Patient identification and proposed procedure were                         verified by the physician, the nurse, the anesthetist                         and the technician in the endoscopy suite. Mental                         Status Examination: alert and oriented. Airway                         Examination: normal oropharyngeal airway and neck                         mobility. Respiratory Examination: clear to  auscultation. CV Examination: normal. Prophylactic                         Antibiotics: The patient does not require prophylactic                         antibiotics. Prior Anticoagulants: The patient has                         taken Eliquis  (apixaban), last dose was 4 days prior                         to procedure. ASA Grade Assessment: III - A patient                         with severe systemic disease. After reviewing the                         risks and benefits, the patient was deemed in                         satisfactory condition to undergo the procedure. The                         anesthesia plan was to use monitored anesthesia care                         (MAC). Immediately prior to administration of                         medications, the patient was re-assessed for adequacy                         to receive sedatives. The heart rate, respiratory                         rate, oxygen saturations, blood pressure, adequacy of                         pulmonary ventilation, and response to care were                         monitored throughout the procedure. The physical                         status of the patient was re-assessed after the                         procedure.                        After obtaining informed consent, the colonoscope was                         passed under direct vision. Throughout the procedure,                         the patient's blood pressure, pulse, and oxygen  saturations were monitored continuously. The                         Colonoscope was introduced through the anus and                         advanced to the the cecum, identified by appendiceal                         orifice and ileocecal valve. The colonoscopy was                         performed without difficulty. The patient tolerated                         the procedure well. The quality of the bowel                         preparation was excellent. Findings:      The perianal and digital rectal examinations were normal.      A 3 mm polyp was found in the ascending colon. The polyp was sessile.       The polyp was removed with a cold snare. Resection and retrieval were       complete.  Estimated blood loss was minimal.      A 7 mm polyp was found in the distal transverse colon. The polyp was       sessile. The polyp was removed with a cold snare. Resection and       retrieval were complete. Estimated blood loss was minimal.      A tattoo was seen in the sigmoid colon. The tattoo site appeared normal.       Biopsies were taken with a cold forceps for histology. Estimated blood       loss was minimal.      Non-bleeding internal hemorrhoids were found during retroflexion. The       hemorrhoids were Grade I (internal hemorrhoids that do not prolapse).       Prominent rectal veins also present.      The exam was otherwise without abnormality on direct and retroflexion       views. Impression:            - One 3 mm polyp in the ascending colon, removed with                         a cold snare. Resected and retrieved.                        - One 7 mm polyp in the distal transverse colon,                         removed with a cold snare. Resected and retrieved.                        - A tattoo was seen in the sigmoid colon. The tattoo                         site appeared normal. Biopsied.                        -  Non-bleeding internal hemorrhoids. Prominent rectal                         veins.                        - The examination was otherwise normal on direct and                         retroflexion views. Recommendation:        - Discharge patient to home.                        - Resume previous diet.                        - Resume Eliquis (apixaban) at prior dose tomorrow.                        - Await pathology results.                        - Repeat colonoscopy for surveillance based on                         pathology results.                        - Return to referring physician as previously                         scheduled. Procedure Code(s):     --- Professional ---                        [phone], Colonoscopy, flexible; with removal of                          tumor(s), polyp(s), or other lesion(s) by snare                         technique                        45380, 52, Colonoscopy, flexible; with biopsy, single                         or multiple Diagnosis Code(s):     --- Professional ---                        Z86.010, Personal history of colonic polyps                        K63.5, Polyp of colon                        K64.0, First degree hemorrhoids CPT copyright 2019 American Medical Association. All rights reserved. The codes documented in this report are preliminary and upon coder review may  be revised to meet current compliance requirements. Andrey Farmer, MD Andrey Farmer MD, MD 10/03/2019 10:45:33 AM Number of Addenda: 0 Note Initiated On: 10/03/2019 9:23 AM Scope Withdrawal Time: 0 hours 15 minutes 10 seconds  Total Procedure Duration: 0 hours 24 minutes 45 seconds  Estimated Blood Loss:  Estimated blood loss was minimal.      [facility]

## 2019-10-03 NOTE — Anesthesia Procedure Notes (Signed)
Performed by: Vaughan Sine Pre-anesthesia Checklist: Patient identified, Emergency Drugs available, Suction available and Patient being monitored Patient Re-evaluated:Patient Re-evaluated prior to induction Oxygen Delivery Method: Simple face mask Preoxygenation: Pre-oxygenation with 100% oxygen Induction Type: IV induction Airway Equipment and Method: Bite block Placement Confirmation: positive ETCO2 and CO2 detector

## 2019-10-03 NOTE — Anesthesia Preprocedure Evaluation (Addendum)
Anesthesia Evaluation  Patient identified by MRN, date of birth, ID band Patient awake    Reviewed: Allergy & Precautions, H&P , NPO status , Patient's Chart, lab work & pertinent test results  History of Anesthesia Complications Negative for: history of anesthetic complications  Airway Mallampati: III  TM Distance: >3 FB     Dental  (+) Upper Dentures, Lower Dentures   Pulmonary neg sleep apnea, neg COPD, former smoker,     + decreased breath sounds      Cardiovascular hypertension, (-) angina+ CAD and +CHF  (-) Past MI and (-) Cardiac Stents + dysrhythmias Atrial Fibrillation  Rhythm:regular Rate:Normal  05/27/19: 1. The left ventricle is moderately dilated in size with normal wall  thickness.  2. The left ventricular systolic function is severely decreased, LVEF is  visually estimated at 35%.  3. The mitral valve leaflets are mildly thickened with normal leaflet  mobility.  4. There ismild to moderate mitral valve regurgitation.  5. The left atrium is severely dilated in size.  6. The right ventricle is severely dilated in size, with moderately reduced  systolic function.  7. There is severe tricuspid regurgitation.  8. Thereis mild-moderate pulmonary hypertension, estimated pulmonary artery  systolic pressure is 41 mmHg.  9. The right atrium is severely dilated in size.  10. IVC size and inspiratory change suggest elevated right atrial pressure.  (10-20 mmHg).     Neuro/Psych negative neurological ROS  negative psych ROS   GI/Hepatic negative GI ROS, (+) Cirrhosis     substance abuse  alcohol use, Alcoholic steatohepatitis   Endo/Other  diabetesHypothyroidism   Renal/GU Renal disease (CKD)  negative genitourinary   Musculoskeletal   Abdominal   Peds  Hematology negative hematology ROS (+) Blood dyscrasia, anemia , thrombocytopenia   Anesthesia Other Findings Past Medical  History: No date: (HFpEF) heart failure with preserved ejection fraction ([place])     Comment:  a. 03/2019 cMRI: EF 30-40%, glob HK; b. 05/2019 Echo               [facility]): EF 35%. No date: CHF (congestive heart failure) (HCC) No date: CKD (chronic kidney disease), stage III No date: Coumadin resistance (H[facility]) No date: Diabetes mellitus without complication ([place]) No date: Dysrhythmia     Comment:  A. Fib No date: Fatty liver No date: H/O ETOH abuse No date: Hepatic encephalopathy (HCC)     Comment:  history No date: Hx of adenomatous polyp of colon No date: Hx of thrombocytopenia No date: Hypertension No date: Hypothyroidism No date: Macrocytic anemia No date: Mixed hyperlipidemia No date: NICM (nonischemic cardiomyopathy) ([place])     Comment:  a. 03/2019 cMRI: EF 30-40%, glob HK. No LGE. No evidence               of infiltrative cardiomyopathy; b. 05/2019 Echo [facility]): EF               35%, mild to mod MR, sev dil LA, sev dil RV w/ mod               reduced RV fxn. Sev TR. Mild-mod PAH (PASP 21mmHg), sev               dil RA. No date: Nonobstructive CAD (coronary artery disease)     Comment:  a. 02/2019 Cath [facility]): LM nl, LAD 47m, D1 small, D2               60ost, D3 small, LCX nl, OM2 25, RCA  79m, 25d. No date: Persistent atrial fibrillation ([place])     Comment:  a. CHA2DS2VASc = 5-->eliquis; b. 05/2019 s/p RFCA/PVI               [facility]). No date: Valvular heart disease     Comment:  a. 05/2019 Echo [facility]): mild to mod MR, sev TR.  Past Surgical History: No date: CARDIAC CATHETERIZATION     Comment:  no stents No date: COLONOSCOPY 07/26/2015: COLONOSCOPY WITH PROPOFOL; N/A     Comment:  Procedure: COLONOSCOPY WITH PROPOFOL;  Surgeon: Manya Silvas, MD;  Location: [facility] ENDOSCOPY;  Service:               Endoscopy;  Laterality: N/A; No date: HAMMER TOE SURGERY     Reproductive/Obstetrics negative OB ROS                            Anesthesia  Physical Anesthesia Plan  ASA: IV  Anesthesia Plan: General   Post-op Pain Management:    Induction:   PONV Risk Score and Plan: Propofol infusion and TIVA  Airway Management Planned: Simple Face Mask  Additional Equipment:   Intra-op Plan:   Post-operative Plan:   Informed Consent: I have reviewed the patients History and Physical, chart, labs and discussed the procedure including the risks, benefits and alternatives for the proposed anesthesia with the patient or authorized representative who has indicated his/her understanding and acceptance.     Dental Advisory Given  Plan Discussed with: Anesthesiologist, CRNA and Surgeon  Anesthesia Plan Comments:         Anesthesia Quick Evaluation

## 2019-10-03 NOTE — Op Note (Signed)
[facility] Gastroenterology Patient Name: [name] Procedure Date: 10/03/2019 9:23 AM MRN: [id number] Account #: [id number] Date of Birth: 09-24-1950 Admit Type: Outpatient Age: 69 Room: [facility] ENDO ROOM 3 Gender: Male Note Status: Finalized Procedure:             Upper GI endoscopy Indications:           Dyspepsia, Dysphagia, Esophageal varices Providers:             Andrey Farmer MD, MD Referring MD:          Minna Merritts, MD (Referring MD) Medicines:             Monitored Anesthesia Care Complications:         No immediate complications. Estimated blood loss:                         Minimal. Procedure:             Pre-Anesthesia Assessment:                        - Prior to the procedure, a History and Physical was                         performed, and patient medications and allergies were                         reviewed. The patient is competent. The risks and                         benefits of the procedure and the sedation options and                         risks were discussed with the patient. All questions                         were answered and informed consent was obtained.                         Patient identification and proposed procedure were                         verified by the physician, the nurse, the anesthetist                         and the technician in the endoscopy suite. Mental                         Status Examination: alert and oriented. Airway                         Examination: normal oropharyngeal airway and neck                         mobility. Respiratory Examination: clear to                         auscultation. CV Examination: normal. Prophylactic  Antibiotics: The patient does not require prophylactic                         antibiotics. Prior Anticoagulants: The patient has                         taken Eliquis (apixaban), last dose was 4 days prior                         to  procedure. ASA Grade Assessment: III - A patient                         with severe systemic disease. After reviewing the                         risks and benefits, the patient was deemed in                         satisfactory condition to undergo the procedure. The                         anesthesia plan was to use monitored anesthesia care                         (MAC). Immediately prior to administration of                         medications, the patient was re-assessed for adequacy                         to receive sedatives. The heart rate, respiratory                         rate, oxygen saturations, blood pressure, adequacy of                         pulmonary ventilation, and response to care were                         monitored throughout the procedure. The physical                         status of the patient was re-assessed after the                         procedure.                        After obtaining informed consent, the endoscope was                         passed under direct vision. Throughout the procedure,                         the patient's blood pressure, pulse, and oxygen                         saturations were monitored continuously. The Endoscope  was introduced through the mouth, and advanced to the                         second part of duodenum. The upper GI endoscopy was                         accomplished without difficulty. The patient tolerated                         the procedure well. Findings:      One tongue of salmon-colored mucosa was present. The maximum       longitudinal extent of these esophageal mucosal changes was 1 cm in       length. Biopsies were taken with a cold forceps for histology. Estimated       blood loss was minimal.      There is no endoscopic evidence of varices in the entire esophagus.      Type 1 isolated gastric varices (IGV1, varices located in the fundus)       with no bleeding were found  in the gastric fundus.      A single less than 1 mm sessile polyp was found in the duodenal bulb.       The polyp was removed with a cold biopsy forceps. Resection and       retrieval were complete. Estimated blood loss was minimal. Impression:            - Salmon-colored mucosa suspicious for short-segment                         Barrett's esophagus. Biopsied.                        - Type 1 isolated gastric varices (IGV1, varices                         located in the fundus), without bleeding.                        - A single duodenal polyp. Resected and retrieved. Recommendation:        - Discharge patient to home.                        - Resume previous diet.                        - Continue present medications.                        - Await pathology results.                        - Perform CT scan (computed tomography) of the abdomen                         with contrast at appointment to be scheduled for                         evaluation of splenic vein thrombosis. Procedure Code(s):     --- Professional ---  15830, Esophagogastroduodenoscopy, flexible,                         transoral; with biopsy, single or multiple Diagnosis Code(s):     --- Professional ---                        K22.8, Other specified diseases of esophagus                        I86.4, Gastric varices                        K31.7, Polyp of stomach and duodenum                        R10.13, Epigastric pain                        R13.10, Dysphagia, unspecified                        I85.00, Esophageal varices without bleeding CPT copyright 2019 American Medical Association. All rights reserved. The codes documented in this report are preliminary and upon coder review may  be revised to meet current compliance requirements. Andrey Farmer, MD Andrey Farmer MD, MD 10/03/2019 10:38:39 AM Number of Addenda: 0 Note Initiated On: 10/03/2019 9:23 AM Estimated Blood Loss:   Estimated blood loss was minimal.      [facility]

## 2019-10-03 NOTE — Transfer of Care (Signed)
Immediate Anesthesia Transfer of Care Note  Patient: [name]  Procedure(s) Performed: COLONOSCOPY WITH PROPOFOL (N/A ) ESOPHAGOGASTRODUODENOSCOPY (EGD) WITH PROPOFOL (N/A )  Patient Location: PACU  Anesthesia Type:General  Level of Consciousness: awake and sedated  Airway & Oxygen Therapy: Patient Spontanous Breathing and Patient connected to nasal cannula oxygen  Post-op Assessment: Report given to RN and Post -op Vital signs reviewed and stable  Post vital signs: Reviewed and stable  Last Vitals:  Vitals Value Taken Time  BP    Temp    Pulse    Resp    SpO2      Last Pain:  Vitals:   10/03/19 0934  TempSrc: Temporal  PainSc: 0-No pain         Complications: No complications documented.

## 2019-10-04 LAB — SURGICAL PATHOLOGY

## 2019-10-16 ENCOUNTER — Ambulatory Visit
Admission: RE | Admit: 2019-10-16 | Discharge: 2019-10-16 | Disposition: A | Discharge status: Home / Self Care | Payer: PPO | Source: Ambulatory Visit | Attending: Gastroenterology | Admitting: Gastroenterology

## 2019-10-16 ENCOUNTER — Ambulatory Visit: Payer: PPO | Admitting: Dermatology

## 2019-10-16 ENCOUNTER — Encounter: Payer: Self-pay | Admitting: Dermatology

## 2019-10-16 ENCOUNTER — Other Ambulatory Visit: Payer: Self-pay

## 2019-10-16 DIAGNOSIS — B351 Tinea unguium: Secondary | ICD-10-CM

## 2019-10-16 DIAGNOSIS — K3189 Other diseases of stomach and duodenum: Secondary | ICD-10-CM | POA: Diagnosis not present

## 2019-10-16 DIAGNOSIS — I872 Venous insufficiency (chronic) (peripheral): Secondary | ICD-10-CM

## 2019-10-16 DIAGNOSIS — Z8614 Personal history of Methicillin resistant Staphylococcus aureus infection: Secondary | ICD-10-CM | POA: Diagnosis not present

## 2019-10-16 DIAGNOSIS — C9000 Multiple myeloma not having achieved remission: Secondary | ICD-10-CM

## 2019-10-16 DIAGNOSIS — I700 Atherosclerosis of aorta: Secondary | ICD-10-CM | POA: Diagnosis not present

## 2019-10-16 DIAGNOSIS — I864 Gastric varices: Secondary | ICD-10-CM

## 2019-10-16 DIAGNOSIS — R161 Splenomegaly, not elsewhere classified: Secondary | ICD-10-CM | POA: Diagnosis not present

## 2019-10-16 DIAGNOSIS — K4020 Bilateral inguinal hernia, without obstruction or gangrene, not specified as recurrent: Secondary | ICD-10-CM | POA: Diagnosis not present

## 2019-10-16 DIAGNOSIS — D7389 Other diseases of spleen: Secondary | ICD-10-CM | POA: Diagnosis not present

## 2019-10-16 LAB — POCT I-STAT CREATININE: Creatinine, Ser: 1.5 mg/dL — ABNORMAL HIGH (ref 0.61–1.24)

## 2019-10-16 MED ORDER — KETOCONAZOLE 2 % EX CREA: 1.0000 "application " | TOPICAL_CREAM | Freq: Every day | CUTANEOUS | 3 refills | Status: DC

## 2019-10-16 MED ORDER — IOHEXOL 300 MG/ML  SOLN
100.0000 mL | Freq: Once | INTRAMUSCULAR | Status: AC | PRN
  Administered 2019-10-16: 80 mL via INTRAVENOUS

## 2019-10-16 MED ORDER — MUPIROCIN 2 % EX OINT: 1.0000 "application " | TOPICAL_OINTMENT | Freq: Every day | CUTANEOUS | 1 refills | Status: AC

## 2019-10-16 MED ORDER — DOXYCYCLINE HYCLATE 100 MG PO TABS: 100.0000 mg | ORAL_TABLET | Freq: Two times a day (BID) | ORAL | 2 refills | Status: AC

## 2019-10-16 NOTE — Progress Notes (Signed)
   New Patient Visit  Subjective  [name] is a 69 y.o. male who presents for the following: Rash (bilateral lower legs - has had biopsies with [facility] on [place] and lab work. He then saw Dr. Ola Spurr in [place] at [facility] and he did labs. Both drs told patient they did not know what was going on. He was recently diagnosed with Smoldering Myeloma and sees Dr. Grayland Ormond. He also has a history of MRSA.).  The following portions of the chart were reviewed this encounter and updated as appropriate:  Tobacco  Allergies  Meds  Problems  Med Hx  Surg Hx  Fam Hx     Review of Systems:  No other skin or systemic complaints except as noted in HPI or Assessment and Plan.  Objective  Well appearing patient in no apparent distress; mood and affect are within normal limits.  A focused examination was performed including bilateral lower legs and feet. Relevant physical exam findings are noted in the Assessment and Plan.  Objective  Bilateral feet and nails: Scaliness of feet and severe toenail dystrophy.  Objective  Bilateral lower legs: Severe stasis changes with varicosities.  Images                 Assessment & Plan    "Smoldering Myeloma" Continue follow up appointments with Dr. Grayland Ormond. His myeloma is complicating and exacerbating his other conditions below. Complicated patient. It was difficult to decipher all going on today, but I feel the below is a map and summary to the problems for which he presented today.  History of MRSA - culture proven on legs in past No evidence of infection today. Mupirocin ointment to any scrapes or sores prn. Doxycycline 100 mg 1 po bid x 7 days with food -  if any evidence of infection; pus or non-healing sores.  Tinea unguium and pedis Bilateral feet and nails ketoconazole (NIZORAL) 2 % cream - Bilateral feet and nails Discussed oral treatment, but pt has cirrhosis of liver, so will not treat orally.  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  Chart review shows: Biopsy of legs from [facility] dermatology April and May 2021 showed mixed dermal inflammation with suppuration and necrosis.  A tissue fungal culture performed by [facility]  dermatology showed no acid fast bacilli. Tissue cultures showed no deep fungal infection. + culture for MRSA I believe this above MRSA infection was causing cellulitis of the legs superimposed on his severe stasis dermatitis.  Will refer patient to [facility] for evaluation.  mupirocin ointment (BACTROBAN) 2 % - Bilateral lower legs  doxycycline (VIBRA-TABS) 100 MG tablet - Bilateral lower legs  Other Related Procedures Ambulatory referral to Vascular Surgery  Return in about 4 months (around 02/15/2020).   I, Ashok Cordia, CMA, am acting as scribe for Sarina Ser, MD .  Documentation: I have reviewed the above documentation for accuracy and completeness, and I agree with the above.  Sarina Ser, MD

## 2019-10-26 ENCOUNTER — Ambulatory Visit (INDEPENDENT_AMBULATORY_CARE_PROVIDER_SITE_OTHER): Payer: PPO | Admitting: Vascular Surgery

## 2019-10-26 ENCOUNTER — Other Ambulatory Visit: Payer: Self-pay

## 2019-10-26 ENCOUNTER — Encounter (INDEPENDENT_AMBULATORY_CARE_PROVIDER_SITE_OTHER): Payer: Self-pay | Admitting: Vascular Surgery

## 2019-10-26 VITALS — BP 110/72 | HR 92 | Ht 74.0 in | Wt 201.0 lb

## 2019-10-26 DIAGNOSIS — I739 Peripheral vascular disease, unspecified: Secondary | ICD-10-CM | POA: Insufficient documentation

## 2019-10-26 DIAGNOSIS — I10 Essential (primary) hypertension: Secondary | ICD-10-CM | POA: Diagnosis not present

## 2019-10-26 DIAGNOSIS — E785 Hyperlipidemia, unspecified: Secondary | ICD-10-CM | POA: Insufficient documentation

## 2019-10-26 DIAGNOSIS — I872 Venous insufficiency (chronic) (peripheral): Secondary | ICD-10-CM | POA: Diagnosis not present

## 2019-10-26 DIAGNOSIS — E782 Mixed hyperlipidemia: Secondary | ICD-10-CM

## 2019-10-26 DIAGNOSIS — I890 Lymphedema, not elsewhere classified: Secondary | ICD-10-CM | POA: Insufficient documentation

## 2019-10-26 DIAGNOSIS — I4819 Other persistent atrial fibrillation: Secondary | ICD-10-CM | POA: Diagnosis not present

## 2019-10-26 NOTE — Progress Notes (Signed)
MRN : [id number]  [name] is a 69 y.o. (1950/04/01) male who presents with chief complaint of No chief complaint on file. [name]  History of Present Illness:   Patient is seen for evaluation of leg pain. The patient first noticed the pain and discoloration remotely. The discoloration is associated with pain and discoloration. The pain and discoloration worsens with prolonged dependency and improves with elevation. The pain is unrelated to activity.  The patient notes that in the morning the legs are significantly improved but they steadily worsened throughout the course of the day. The patient also notes a steady worsening of the discoloration in the ankle and shin area.   The patient denies claudication symptoms.  The patient denies symptoms consistent with rest pain.  The patient denies and extensive history of DJD and LS spine disease.  The patient has no had any past angiography, interventions or vascular surgery.  Elevation makes the leg symptoms better, dependency makes them much worse. There is no history of ulcerations. The patient denies any recent changes in medications.  The patient has not been wearing graduated compression.  The patient denies a history of DVT or PE. There is no prior history of phlebitis. There is no history of primary lymphedema.  No history of malignancies. No history of trauma or groin or pelvic surgery. There is no history of radiation treatment to the groin or pelvis  The patient denies amaurosis fugax or recent TIA symptoms. There are no recent neurological changes noted. The patient denies recent episodes of angina or shortness of breath  No outpatient medications have been marked as taking for the 10/26/19 encounter (Appointment) with Delana Meyer, Dolores Lory, MD.    Past Medical History:  Diagnosis Date  . (HFpEF) heart failure with preserved ejection fraction ([place])    a. 03/2019 cMRI: EF 30-40%, glob HK; b. 05/2019 Echo [facility]): EF 35%.  . CHF  (congestive heart failure) ([place])   . CKD (chronic kidney disease), stage III   . Coumadin resistance ([place])   . Diabetes mellitus without complication ([place])   . Dysrhythmia    A. Fib  . Fatty liver   . H/O ETOH abuse   . Hepatic encephalopathy ([place])    history  . Hx of adenomatous polyp of colon   . Hx of thrombocytopenia   . Hypertension   . Hypothyroidism   . Macrocytic anemia   . Mixed hyperlipidemia   . NICM (nonischemic cardiomyopathy) ([place])    a. 03/2019 cMRI: EF 30-40%, glob HK. No LGE. No evidence of infiltrative cardiomyopathy; b. 05/2019 Echo [facility]): EF 35%, mild to mod MR, sev dil LA, sev dil RV w/ mod reduced RV fxn. Sev TR. Mild-mod PAH (PASP 67mmHg), sev dil RA.  [name] Nonobstructive CAD (coronary artery disease)    a. 02/2019 Cath [facility]): LM nl, LAD 18m, D1 small, D2 60ost, D3 small, LCX nl, OM2 25, RCA 64m, 25d.  [name] Persistent atrial fibrillation ([place])    a. CHA2DS2VASc = 5-->eliquis; b. 05/2019 s/p RFCA/PVI [facility]).  . Valvular heart disease    a. 05/2019 Echo [facility]): mild to mod MR, sev TR.    Past Surgical History:  Procedure Laterality Date  . CARDIAC CATHETERIZATION     no stents  . COLONOSCOPY    . COLONOSCOPY WITH PROPOFOL N/A 07/26/2015   Procedure: COLONOSCOPY WITH PROPOFOL;  Surgeon: Manya Silvas, MD;  Location: [facility] ENDOSCOPY;  Service: Endoscopy;  Laterality: N/A;  . COLONOSCOPY WITH PROPOFOL N/A 10/03/2019  Procedure: COLONOSCOPY WITH PROPOFOL;  Surgeon: Lesly Rubenstein, MD;  Location: [facility] ENDOSCOPY;  Service: Endoscopy;  Laterality: N/A;  . ESOPHAGOGASTRODUODENOSCOPY (EGD) WITH PROPOFOL N/A 10/03/2019   Procedure: ESOPHAGOGASTRODUODENOSCOPY (EGD) WITH PROPOFOL;  Surgeon: Lesly Rubenstein, MD;  Location: [facility] ENDOSCOPY;  Service: Endoscopy;  Laterality: N/A;  . HAMMER TOE SURGERY      Social History Social History   Tobacco Use  . Smoking status: Former Research scientist (life sciences)  . Smokeless tobacco: Current User    Types: Snuff  Vaping Use  . Vaping Use: Never used   Substance Use Topics  . Alcohol use: No  . Drug use: No    Family History Family History  Problem Relation Age of Onset  . Heart Problems Mother   . Heart Problems Father   . Heart Problems Sister     Allergies  Allergen Reactions  . Penicillins Hives     REVIEW OF SYSTEMS (Negative unless checked)  Constitutional: [] Weight loss  [] Fever  [] Chills Cardiac: [] Chest pain   [] Chest pressure   [] Palpitations   [] Shortness of breath when laying flat   [] Shortness of breath with exertion. Vascular:  [] Pain in legs with walking   [x] Pain in legs at rest  [] History of DVT   [] Phlebitis   [] Swelling in legs   [] Varicose veins   [] Non-healing ulcers Pulmonary:   [] Uses home oxygen   [] Productive cough   [] Hemoptysis   [] Wheeze  [] COPD   [] Asthma Neurologic:  [] Dizziness   [] Seizures   [] History of stroke   [] History of TIA  [] Aphasia   [] Vissual changes   [] Weakness or numbness in arm   [] Weakness or numbness in leg Musculoskeletal:   [] Joint swelling   [x] Joint pain   [] Low back pain Hematologic:  [] Easy bruising  [] Easy bleeding   [] Hypercoagulable state   [] Anemic Gastrointestinal:  [] Diarrhea   [] Vomiting  [] Gastroesophageal reflux/heartburn   [] Difficulty swallowing. Genitourinary:  [] Chronic kidney disease   [] Difficult urination  [] Frequent urination   [] Blood in urine Skin:  [] Rashes   [] Ulcers  Psychological:  [] History of anxiety   []  History of major depression.  Physical Examination  There were no vitals filed for this visit. There is no height or weight on file to calculate BMI. Gen: WD/WN, NAD Head: [place]/AT, No temporalis wasting.  Ear/Nose/Throat: Hearing grossly intact, nares w/o erythema or drainage Eyes: PER, EOMI, sclera nonicteric.  Neck: Supple, no large masses.   Pulmonary:  Good air movement, no audible wheezing bilaterally, no use of accessory muscles.  Cardiac: RRR, no JVD Vascular: scattered varicosities present bilaterally.  Mild venous stasis changes to  the legs bilaterally.  Trace soft pitting edema. Vessel Right Left  Radial Palpable Palpable  PT Not Palpable Not Palpable  DP Not Palpable Not Palpable  Gastrointestinal: Non-distended. No guarding/no peritoneal signs.  Musculoskeletal: M/S 5/5 throughout.  No deformity or atrophy.  Neurologic: CN 2-12 intact. Symmetrical.  Speech is fluent. Motor exam as listed above. Psychiatric: Judgment intact, Mood & affect appropriate for pt's clinical situation. Dermatologic: Severe venous rashes no ulcers noted.  No changes consistent with cellulitis.  CBC Lab Results  Component Value Date   WBC 3.6 (L) 08/02/2019   HGB 11.5 (L) 08/02/2019   HCT 35.5 (L) 08/02/2019   MCV 101.4 (H) 08/02/2019   PLT 82 (L) 08/02/2019    BMET    Component Value Date/Time   NA 138 08/02/2019 1050   NA 139 04/10/2019 0945   NA 136 01/13/2013 0405   K  4.1 08/02/2019 1050   K 4.6 01/13/2013 0405   CL 105 08/02/2019 1050   CL 102 01/13/2013 0405   CO2 25 08/02/2019 1050   CO2 31 01/13/2013 0405   GLUCOSE 163 (H) 08/02/2019 1050   GLUCOSE 129 (H) 01/13/2013 0405   BUN 33 (H) 08/02/2019 1050   BUN 19 04/10/2019 0945   BUN 14 01/13/2013 0405   CREATININE 1.50 (H) 10/16/2019 1145   CREATININE 1.32 (H) 01/13/2013 0405   CALCIUM 9.3 08/02/2019 1050   CALCIUM 8.8 01/13/2013 0405   GFRNONAA 41 (L) 08/02/2019 1050   GFRNONAA 57 (L) 01/13/2013 0405   GFRAA 47 (L) 08/02/2019 1050   GFRAA >60 01/13/2013 0405   CrCl cannot be calculated (Unknown ideal weight.).  COAG Lab Results  Component Value Date   INR 1.4 (H) 04/04/2019   INR 1.1 02/22/2012    Radiology CT ABDOMEN PELVIS W CONTRAST  Result Date: 10/16/2019 CLINICAL DATA:  Follow-up after colonoscopy. EXAM: CT ABDOMEN AND PELVIS WITH CONTRAST TECHNIQUE: Multidetector CT imaging of the abdomen and pelvis was performed using the standard protocol following bolus administration of intravenous contrast. CONTRAST:  59mL OMNIPAQUE IOHEXOL 300 MG/ML  SOLN  COMPARISON:  None. FINDINGS: Lower chest: There is moderate severity cardiomegaly. Hepatobiliary: No focal liver abnormality is seen. No gallstones, gallbladder wall thickening, or biliary dilatation. Pancreas: Unremarkable. No pancreatic ductal dilatation or surrounding inflammatory changes. Spleen: There is mild to moderate severity splenomegaly. A curvilinear area of calcification is seen along the lateral aspect of the spleen. Adrenals/Urinary Tract: Adrenal glands are unremarkable. Kidneys are normal, without renal calculi, focal lesion, or hydronephrosis. Bladder is unremarkable. Stomach/Bowel: Mild gastric wall thickening is seen along the lateral aspect of the gastric fundus (axial CT images 23 through 32, CT series number 2). Appendix appears normal. No evidence of bowel wall thickening, distention, or inflammatory changes. Vascular/Lymphatic: There is marked severity calcification and atherosclerosis of the abdominal aorta and bilateral common iliac arteries, without evidence of aneurysmal dilatation. No enlarged abdominal or pelvic lymph nodes. Reproductive: Prostate is unremarkable. Other: There is a 2.8 cm x 2.5 cm fat containing right inguinal hernia. A 5.9 cm x 3.6 cm fat containing left inguinal hernia is seen. This extends inferiorly into the scrotal region. Musculoskeletal: Multilevel degenerative changes seen throughout the lumbar spine. IMPRESSION: 1. Mild gastric wall thickening along the lateral aspect of the gastric fundus. Correlation with follow-up abdomen pelvis CT is recommended to exclude the presence of an underlying neoplastic process. 2. Mild to moderate severity splenomegaly. 3. Bilateral fat containing inguinal hernias. 4. Aortic atherosclerosis. Aortic Atherosclerosis (ICD10-I70.0). Electronically Signed   By: Virgina Norfolk M.D.   On: 10/16/2019 20:09      Assessment/Plan 1. Chronic venous insufficiency I have had a long discussion with the patient regarding swelling and  why it  causes symptoms.  Patient will begin wearing graduated compression stockings class 1 (20-30 mmHg) on a daily basis a prescription was given. The patient will  beginning wearing the stockings first thing in the morning and removing them in the evening. The patient is instructed specifically not to sleep in the stockings.   In addition, behavioral modification will be initiated.  This will include frequent elevation, use of over the counter pain medications and exercise such as walking.  I have reviewed systemic causes for chronic edema such as liver, kidney and cardiac etiologies.  The patient denies problems with these organ systems.    Patient should undergo duplex ultrasound of the venous system  to ensure that DVT or reflux is not present.  The patient will follow-up with me after the ultrasound.   - VAS [place] LOWER EXTREMITY VENOUS (DVT); Future  2. PAD (peripheral artery disease) (HCC)  Recommend:  The patient has evidence of atherosclerosis of the lower extremities with claudication.  The patient does not voice lifestyle limiting changes at this point in time.  Noninvasive studies do not suggest clinically significant change.  No invasive studies, angiography or surgery at this time The patient should continue walking and begin a more formal exercise program.  The patient should continue antiplatelet therapy and aggressive treatment of the lipid abnormalities  No changes in the patient's medications at this time  The patient should continue wearing graduated compression socks 10-15 mmHg strength to control the mild edema.   - VAS [place] ABI WITH/WO TBI; Future  3. Persistent atrial fibrillation (HCC) Continue antiarrhythmia medications as already ordered, these medications have been reviewed and there are no changes at this time.  Continue anticoagulation as ordered by Cardiology Service   4. Essential hypertension Continue antihypertensive medications as already ordered,  these medications have been reviewed and there are no changes at this time.   5. Mixed hyperlipidemia Continue statin as ordered and reviewed, no changes at this time    Hortencia Pilar, MD  10/26/2019 1:02 PM

## 2019-10-28 ENCOUNTER — Other Ambulatory Visit: Payer: Self-pay | Admitting: Family

## 2019-10-28 DIAGNOSIS — I428 Other cardiomyopathies: Secondary | ICD-10-CM

## 2019-10-28 DIAGNOSIS — I5022 Chronic systolic (congestive) heart failure: Secondary | ICD-10-CM

## 2019-11-03 ENCOUNTER — Other Ambulatory Visit: Payer: Self-pay

## 2019-11-03 ENCOUNTER — Ambulatory Visit (INDEPENDENT_AMBULATORY_CARE_PROVIDER_SITE_OTHER): Payer: PPO

## 2019-11-03 ENCOUNTER — Ambulatory Visit (INDEPENDENT_AMBULATORY_CARE_PROVIDER_SITE_OTHER): Payer: PPO | Admitting: Nurse Practitioner

## 2019-11-03 VITALS — BP 125/69 | HR 86 | Ht 74.0 in | Wt 208.0 lb

## 2019-11-03 DIAGNOSIS — E782 Mixed hyperlipidemia: Secondary | ICD-10-CM | POA: Diagnosis not present

## 2019-11-03 DIAGNOSIS — I739 Peripheral vascular disease, unspecified: Secondary | ICD-10-CM | POA: Diagnosis not present

## 2019-11-03 DIAGNOSIS — I872 Venous insufficiency (chronic) (peripheral): Secondary | ICD-10-CM | POA: Diagnosis not present

## 2019-11-03 DIAGNOSIS — I10 Essential (primary) hypertension: Secondary | ICD-10-CM

## 2019-11-03 DIAGNOSIS — I890 Lymphedema, not elsewhere classified: Secondary | ICD-10-CM | POA: Diagnosis not present

## 2019-11-09 ENCOUNTER — Encounter (INDEPENDENT_AMBULATORY_CARE_PROVIDER_SITE_OTHER): Payer: Self-pay | Admitting: Nurse Practitioner

## 2019-11-09 NOTE — Progress Notes (Signed)
Subjective:    Patient ID: [name], male    DOB: May 08, 1950, 69 y.o.   MRN: [id number] Chief Complaint  Patient presents with  . Follow-up    pt conv. LE ven reflux     [name] is a 69 year old male that returns today for noninvasive studies.  The patient is referred by his primary care physician for concern for dark lower extremities.  The left lower extremity is more concerning than the right.  The patient notes that there was darkness started on house lower extremities that started without precipitating event.  Patient does have some notable varicosities on his bilateral lower extremities.  However he denies any pain or tenderness over these varicosities.  He denies any fever, chills, nausea, vomiting or diarrhea.  He denies any claudication-like symptoms or rest pain.  He denies any amaurosis fugax or TIA-like symptoms.  Today noninvasive studies show noncompressible ABIs bilaterally however there are triphasic tibial artery waveforms that are strong with strong toe waveforms bilaterally.  A bilateral lower extremity venous reflux study shows no evidence of DVT or superficial venous stenosis in the bilateral lower extremities.  The right lower extremity has reflux within the common femoral vein extending to the popliteal vein.  There is also reflux in the great saphenous vein at the saphenofemoral junction.  The left lower extremity has evidence of reflux in the left common femoral vein as well as reflux in the great saphenous vein at the saphenofemoral junction extending to the mid thigh.   Review of Systems  Cardiovascular: Positive for leg swelling.  Skin: Positive for color change.  All other systems reviewed and are negative.      Objective:   Physical Exam Cardiovascular:     Rate and Rhythm: Normal rate and regular rhythm.     Pulses: Normal pulses.  Pulmonary:     Effort: Pulmonary effort is normal.  Musculoskeletal:        General: Normal range of motion.    Skin:    General: Skin is warm and dry.     Comments: Stasis dermatitis  Neurological:     Mental Status: He is oriented to person, place, and time.  Psychiatric:        Mood and Affect: Mood normal.        Behavior: Behavior normal.        Thought Content: Thought content normal.        Judgment: Judgment normal.     BP 125/69   Pulse 86   Ht 6\' 2"  (1.88 m)   Wt 208 lb (94.3 kg)   BMI 26.71 kg/m   Past Medical History:  Diagnosis Date  . (HFpEF) heart failure with preserved ejection fraction ([place])    a. 03/2019 cMRI: EF 30-40%, glob HK; b. 05/2019 Echo [facility]): EF 35%.  . CHF (congestive heart failure) ([place])   . CKD (chronic kidney disease), stage III ([place])   . Coumadin resistance ([place])   . Diabetes mellitus without complication ([place])   . Dysrhythmia    A. Fib  . Fatty liver   . H/O ETOH abuse   . Hepatic encephalopathy ([place])    history  . Hx of adenomatous polyp of colon   . Hx of thrombocytopenia   . Hypertension   . Hypothyroidism   . Macrocytic anemia   . Mixed hyperlipidemia   . NICM (nonischemic cardiomyopathy) ([place])    a. 03/2019 cMRI: EF 30-40%, glob HK. No LGE. No evidence of infiltrative  cardiomyopathy; b. 05/2019 Echo [facility]): EF 35%, mild to mod MR, sev dil LA, sev dil RV w/ mod reduced RV fxn. Sev TR. Mild-mod PAH (PASP 72mmHg), sev dil RA.  [name] Nonobstructive CAD (coronary artery disease)    a. 02/2019 Cath [facility]): LM nl, LAD 33m, D1 small, D2 60ost, D3 small, LCX nl, OM2 25, RCA 69m, 25d.  [name] Persistent atrial fibrillation ([place])    a. CHA2DS2VASc = 5-->eliquis; b. 05/2019 s/p RFCA/PVI [facility]).  . Valvular heart disease    a. 05/2019 Echo [facility]): mild to mod MR, sev TR.    Social History   Socioeconomic History  . Marital status: Widowed    Spouse name: Not on file  . Number of children: Not on file  . Years of education: Not on file  . Highest education level: Not on file  Occupational History  . Not on file  Tobacco Use  . Smoking status: Former Research scientist (life sciences)   . Smokeless tobacco: Current User    Types: Snuff  Vaping Use  . Vaping Use: Never used  Substance and Sexual Activity  . Alcohol use: No  . Drug use: No  . Sexual activity: Not on file  Other Topics Concern  . Not on file  Social History Narrative  . Not on file   Social Determinants of Health   Financial Resource Strain:   . Difficulty of Paying Living Expenses: Not on file  Food Insecurity:   . Worried About Charity fundraiser in the Last Year: Not on file  . Ran Out of Food in the Last Year: Not on file  Transportation Needs:   . Lack of Transportation (Medical): Not on file  . Lack of Transportation (Non-Medical): Not on file  Physical Activity:   . Days of Exercise per Week: Not on file  . Minutes of Exercise per Session: Not on file  Stress:   . Feeling of Stress : Not on file  Social Connections:   . Frequency of Communication with Friends and Family: Not on file  . Frequency of Social Gatherings with Friends and Family: Not on file  . Attends Religious Services: Not on file  . Active Member of Clubs or Organizations: Not on file  . Attends Archivist Meetings: Not on file  . Marital Status: Not on file  Intimate Partner Violence:   . Fear of Current or Ex-Partner: Not on file  . Emotionally Abused: Not on file  . Physically Abused: Not on file  . Sexually Abused: Not on file    Past Surgical History:  Procedure Laterality Date  . CARDIAC CATHETERIZATION     no stents  . COLONOSCOPY    . COLONOSCOPY WITH PROPOFOL N/A 07/26/2015   Procedure: COLONOSCOPY WITH PROPOFOL;  Surgeon: Manya Silvas, MD;  Location[facility] ENDOSCOPY;  Service: Endoscopy;  Laterality: N/A;  . COLONOSCOPY WITH PROPOFOL N/A 10/03/2019   Procedure: COLONOSCOPY WITH PROPOFOL;  Surgeon: Lesly Rubenstein, MD;  Location: [facility] ENDOSCOPY;  Service: Endoscopy;  Laterality: N/A;  . ESOPHAGOGASTRODUODENOSCOPY (EGD) WITH PROPOFOL N/A 10/03/2019   Procedure: ESOPHAGOGASTRODUODENOSCOPY  (EGD) WITH PROPOFOL;  Surgeon: Lesly Rubenstein, MD;  Location: [facility] ENDOSCOPY;  Service: Endoscopy;  Laterality: N/A;  . HAMMER TOE SURGERY      Family History  Problem Relation Age of Onset  . Heart Problems Mother   . Heart Problems Father   . Heart Problems Sister     Allergies  Allergen Reactions  . Penicillins Hives  Assessment & Plan:   1. Lymphedema No surgery or intervention at this point in time.    I have reviewed my discussion with the patient regarding venous insufficiency and secondary lymph edema and why it  causes symptoms. I have discussed with the patient the chronic skin changes that accompany these problems and the long term sequela such as ulceration and infection.  Patient will continue wearing graduated compression stockings class 1 (20-30 mmHg) on a daily basis a prescription was given to the patient to keep this updated. The patient will  put the stockings on first thing in the morning and removing them in the evening. The patient is instructed specifically not to sleep in the stockings.  In addition, behavioral modification including elevation during the day will be continued.  Diet and salt restriction was also discussed.  Previous duplex ultrasound of the lower extremities shows normal deep venous system, superficial reflux was not present.   Following the review of the ultrasound the patient will follow up in 6 months to reassess the degree of swelling and the control that graduated compression is offering.   The patient can be assessed for a Lymph Pump at that time.  However, at this time the patient states they are satisfied with the control compression and elevation is yielding.    2. Chronic venous insufficiency No surgery or intervention at this point in time.    I have had a long discussion with the patient regarding venous insufficiency and why it  causes symptoms. I have discussed with the patient the chronic skin changes that accompany  venous insufficiency and the long term sequela such as infection and ulceration.  Patient will begin wearing graduated compression stockings class 1 (20-30 mmHg) or compression wraps on a daily basis a prescription was given. The patient will put the stockings on first thing in the morning and removing them in the evening. The patient is instructed specifically not to sleep in the stockings.    In addition, behavioral modification including several periods of elevation of the lower extremities during the day will be continued. I have demonstrated that proper elevation is a position with the ankles at heart level.  The patient is instructed to begin routine exercise, especially walking on a daily basis    3. Mixed hyperlipidemia Continue statin as ordered and reviewed, no changes at this time   4. Essential hypertension Continue antihypertensive medications as already ordered, these medications have been reviewed and there are no changes at this time.    Current Outpatient Medications on File Prior to Visit  Medication Sig Dispense Refill  . apixaban (ELIQUIS) 5 MG TABS tablet Take by mouth 2 (two) times daily.     [name] atorvastatin (LIPITOR) 10 MG tablet Take 10 mg by mouth daily.    . clobetasol cream (TEMOVATE) 3.15 % Apply 1 application topically 2 (two) times daily.    . colchicine 0.6 MG tablet Take by mouth.    . ENTRESTO 24-26 MG TAKE 1 TABLET BY MOUTH EVERYDAY AT BEDTIME 30 tablet 0  . gabapentin (NEURONTIN) 300 MG capsule Take 300 mg by mouth 3 (three) times daily.     [name] ketoconazole (NIZORAL) 2 % cream Apply 1 application topically at bedtime. 60 g 3  . lactobacillus acidophilus (BACID) TABS tablet Take 2 tablets by mouth 3 (three) times daily. 30 tablet 0  . lactulose (CHRONULAC) 10 GM/15ML solution     . levothyroxine (SYNTHROID, LEVOTHROID) 100 MCG tablet Take 100 mcg by mouth  every morning.  4  . metoprolol succinate (TOPROL-XL) 25 MG 24 hr tablet Take 1.5 tablets (37.5 mg  total) by mouth daily. 45 tablet 11  . mupirocin ointment (BACTROBAN) 2 % Apply 1 application topically daily. To any open sores or crusted areas of legs 22 g 1  . pantoprazole (PROTONIX) 20 MG tablet Take 40 mg by mouth 2 (two) times daily.     . potassium chloride SA (KLOR-CON) 20 MEQ tablet Take 1 tablet (20 mEq total) by mouth as directed. Take 1 tablet (20 mEq) once daily. Hold on Monday/Wednesdy/Friday 180 tablet 3  . sucralfate (CARAFATE) 1 g tablet Take 1 g by mouth 4 (four) times daily -  with meals and at bedtime.    . torsemide (DEMADEX) 20 MG tablet Take 1 tablet (20 mg total) by mouth as directed. Hold on Monday/Wednesdy/Friday 90 tablet 3   No current facility-administered medications on file prior to visit.    There are no Patient Instructions on file for this visit. No follow-ups on file.   Kris Hartmann, NP

## 2019-11-13 ENCOUNTER — Encounter: Payer: Self-pay | Admitting: Cardiovascular Disease

## 2019-11-13 ENCOUNTER — Other Ambulatory Visit: Payer: Self-pay

## 2019-11-13 ENCOUNTER — Ambulatory Visit: Payer: PPO | Admitting: Cardiovascular Disease

## 2019-11-13 VITALS — BP 98/56 | HR 66 | Ht 74.0 in | Wt 206.0 lb

## 2019-11-13 DIAGNOSIS — I4819 Other persistent atrial fibrillation: Secondary | ICD-10-CM

## 2019-11-13 DIAGNOSIS — I5020 Unspecified systolic (congestive) heart failure: Secondary | ICD-10-CM

## 2019-11-13 MED ORDER — METOPROLOL SUCCINATE ER 25 MG PO TB24: 12.5000 mg | ORAL_TABLET | Freq: Two times a day (BID) | ORAL | 3 refills | Status: AC

## 2019-11-13 NOTE — Progress Notes (Signed)
Cardiology Office Note  Date:  11/13/2019   ID:  [name], [name] 09-27-1950, MRN [id number]  PCP:  Albina Billet, MD   Chief Complaint  Patient presents with  . Other    3 month follow up. Patient c/o BP staying low.  Meds reviewed verbally with patient.     HPI:  [name] with past medical history of Nonischemic cardiomyopathy Chronic systolic CHF CKD Cirrhosis, NASH, etoh Atrial fibrillation, s/p DCCV 02/16/19 afib ablation at [facility] with TEE 05/2019 LHC 02/15/19 demonstrated 60% ostial D2 stenosis.  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" He presents  for his nonischemic cardiomyopathy  Much of his work-up has been done at [facility]  Some days good,  Other days wobbly, Not sure if from pressure  Reports he stopped drinking  Checks pressure at home, wife does Also confirms with manual Often systolic pressure running in the 90s She is concerned he is overmedicated  Reports he is taking torsemide most days of the week Has been euvolemic he feels  EKG personally reviewed by myself on todays visit Shows atrial fibrillation ventricular rate 66 bpm no significant ST-T wave changes  Other past medical history reviewed In the hospital 04/04/19, with fever 101  left lower extremity  cellulitis. ,treated with keflex elevated ammonia of 82, likely hepatic encephalopathy.     History of EtOH, NASH At d/c , diuretics were held, [place] held (presumably for elevated creatinine)   in our office 04/10/19, labs done, creatinine improved Was not restarted on diuretic  Developed massive fluid retention, abdominal distention, leg edema, up 20 pounds Torsemide restarted 40 twice daily 5 days then down to 20 twice daily Entresto restarted   Cardiac MRI  no infiltrative disease, ejection fraction up to 30 to 40%  05/02/2019: seen by Dr. Caryl Comes  EP: Recommendation to proceed with A. fib ablation CR 1.38 to  1.9, BUN 39 Torsemide dosing decreased down to 20 daily   PMH:   has a past medical history of (HFpEF) heart failure with preserved ejection fraction ([place]), CHF (congestive heart failure) ([place]), CKD (chronic kidney disease), stage III ([place]), Coumadin resistance ([place]), Diabetes mellitus without complication ([place]), Dysrhythmia, Fatty liver, H/O ETOH abuse, Hepatic encephalopathy ([place]), adenomatous polyp of colon, thrombocytopenia, Hypertension, Hypothyroidism, Macrocytic anemia, Mixed hyperlipidemia, NICM (nonischemic cardiomyopathy) ([place]), Nonobstructive CAD (coronary artery disease), Persistent atrial fibrillation ([place]), and Valvular heart disease.  PSH:    Past Surgical History:  Procedure Laterality Date  . CARDIAC CATHETERIZATION     no stents  . COLONOSCOPY    . COLONOSCOPY WITH PROPOFOL N/A 07/26/2015   Procedure: COLONOSCOPY WITH PROPOFOL;  Surgeon: Manya Silvas, MD;  Location: [facility] ENDOSCOPY;  Service: Endoscopy;  Laterality: N/A;  . COLONOSCOPY WITH PROPOFOL N/A 10/03/2019   Procedure: COLONOSCOPY WITH PROPOFOL;  Surgeon: Lesly Rubenstein, MD;  Location: [facility] ENDOSCOPY;  Service: Endoscopy;  Laterality: N/A;  . ESOPHAGOGASTRODUODENOSCOPY (EGD) WITH PROPOFOL N/A 10/03/2019   Procedure: ESOPHAGOGASTRODUODENOSCOPY (EGD) WITH PROPOFOL;  Surgeon: Lesly Rubenstein, MD;  Location: [facility] ENDOSCOPY;  Service: Endoscopy;  Laterality: N/A;  . HAMMER TOE SURGERY      Current Outpatient Medications  Medication Sig Dispense Refill  . apixaban (ELIQUIS) 5 MG TABS tablet Take by mouth 2 (two) times daily.     [name] atorvastatin (LIPITOR) 10 MG tablet Take 10 mg by mouth daily.    . clobetasol cream (TEMOVATE) 0.05 %  Apply 1 application topically 2 (two) times daily.    . colchicine 0.6 MG tablet Take 0.6 mg by mouth 2 (two) times daily.     [name] ENTRESTO 24-26 MG TAKE 1 TABLET BY MOUTH EVERYDAY AT BEDTIME 30 tablet 0  . gabapentin  (NEURONTIN) 300 MG capsule Take 300 mg by mouth 3 (three) times daily.     [name] ketoconazole (NIZORAL) 2 % cream Apply 1 application topically at bedtime. 60 g 3  . lactobacillus acidophilus (BACID) TABS tablet Take 2 tablets by mouth 3 (three) times daily. 30 tablet 0  . lactulose (CHRONULAC) 10 GM/15ML solution     . levothyroxine (SYNTHROID, LEVOTHROID) 100 MCG tablet Take 100 mcg by mouth every morning.  4  . metoprolol succinate (TOPROL-XL) 25 MG 24 hr tablet Take 1.5 tablets (37.5 mg total) by mouth daily. 45 tablet 11  . mupirocin ointment (BACTROBAN) 2 % Apply 1 application topically daily. To any open sores or crusted areas of legs 22 g 1  . pantoprazole (PROTONIX) 20 MG tablet Take 40 mg by mouth daily.     . potassium chloride SA (KLOR-CON) 20 MEQ tablet Take 1 tablet (20 mEq total) by mouth as directed. Take 1 tablet (20 mEq) once daily. Hold on Monday/Wednesdy/Friday 180 tablet 3  . sucralfate (CARAFATE) 1 g tablet Take 1 g by mouth 4 (four) times daily -  with meals and at bedtime.    . torsemide (DEMADEX) 20 MG tablet Take 1 tablet (20 mg total) by mouth as directed. Hold on Monday/Wednesdy/Friday 90 tablet 3   No current facility-administered medications for this visit.    Allergies:   Penicillins   Social History:  The patient  reports that he has quit smoking. His smokeless tobacco use includes snuff. He reports that he does not drink alcohol and does not use drugs.   Family History:   family history includes Heart Problems in his father, mother, and sister.   Review of Systems: Review of Systems  Constitutional: Negative.   HENT: Negative.   Respiratory: Negative.   Cardiovascular: Negative.   Gastrointestinal: Negative.   Musculoskeletal: Negative.   Neurological: Negative.   Psychiatric/Behavioral: Negative.   All other systems reviewed and are negative.   PHYSICAL EXAM: VS:  BP (!) 98/56 (BP Location: Right Arm, Patient Position: Sitting, Cuff Size: Normal)    Pulse 66   Ht _0  (1.88 m)   Wt 206 lb (93.4 kg)   BMI 26.45 kg/m  , BMI Body mass index is 26.45 kg/m. Constitutional:  oriented to person, place, and time. No distress.  HENT:  Head: Grossly normal Eyes:  no discharge. No scleral icterus.  Neck: No JVD, no carotid bruits  Cardiovascular: Irregularly irregular no murmurs appreciated Pulmonary/Chest: Clear to auscultation bilaterally, no wheezes or rails Abdominal: Soft.  no distension.  no tenderness.  Musculoskeletal: Normal range of motion Left lower extremity in a wrap below the knee Neurological:  normal muscle tone. Coordination normal. No atrophy Skin: Skin warm and dry Psychiatric: normal affect, pleasant   Recent Labs: 04/05/2019: TSH 2.342 04/10/2019: Magnesium 2.4 05/02/2019: ALT 18 08/02/2019: BUN 33; Hemoglobin 11.5; Platelets 82; Potassium 4.1; Sodium 138 10/16/2019: Creatinine, Ser 1.50    Lipid Panel No results found for: CHOL, HDL, LDLCALC, TRIG    Wt Readings from Last 3 Encounters:  11/13/19 206 lb (93.4 kg)  11/03/19 208 lb (94.3 kg)  10/26/19 201 lb (91.2 kg)     ASSESSMENT AND PLAN:  Problem List Items  Addressed This Visit    None     Nonischemic cardiomyopathy Reports he stopped alcohol Compliant with his Entresto metoprolol succinate and torsemide Bothered by hypotension, orthostasis symptoms Recommend he decrease metoprolol down to 12.5 mg twice daily down from 37.5 mg daily  MRI no infiltrative disease, ejection fraction up to 30 to 40% He is declining repeat echocardiogram  Atrial fibrillation, persistent Prior ablation, Did not hold, converted back to atrial fibrillation Seen by [facility] EP, Reports he is asymptomatic from his atrial fibrillation  Chronic systolic CHF Taking torsemide 4 days/week Weight stable, overall feels well, creatinine improved at 1.5   Total encounter time more than 25 minutes  Greater than 50% was spent in counseling and coordination of care with the  patient    Signed, Esmond Plants, M.D., Ph.D. [place], [place]

## 2019-11-13 NOTE — Patient Instructions (Addendum)
Medication Instructions:  Please decrease the metoprolol down to 12.5 mg twice a day  If you need a refill on your cardiac medications before your next appointment, please call your pharmacy.    Lab work: No new labs needed   If you have labs (blood work) drawn today and your tests are completely normal, you will receive your results only by: [name] MyChart Message (if you have MyChart) OR . A paper copy in the mail If you have any lab test that is abnormal or we need to change your treatment, we will call you to review the results.   Testing/Procedures: No new testing needed   Follow-Up: At [facility], you and your health needs are our priority.  As part of our continuing mission to provide you with exceptional heart care, we have created designated Provider Care Teams.  These Care Teams include your primary Cardiologist (physician) and Advanced Practice Providers (APPs -  Physician Assistants and Nurse Practitioners) who all work together to provide you with the care you need, when you need it.  . You will need a follow up appointment in 6 months  . Providers on your designated Care Team:   . Murray Hodgkins, NP . Christell Faith, PA-C . Marrianne Mood, PA-C  Any Other Special Instructions Will Be Listed Below (If Applicable).  COVID-19 Vaccine Information can be found at: ShippingScam.co.uk For questions related to vaccine distribution or appointments, please email vaccine@ .com or call [phone].

## 2019-11-20 ENCOUNTER — Telehealth: Payer: Self-pay | Admitting: Internal Medicine

## 2019-11-20 ENCOUNTER — Emergency Department: Payer: PPO

## 2019-11-20 ENCOUNTER — Emergency Department
Admission: EM | Admit: 2019-11-20 | Discharge: 2019-11-20 | Disposition: A | Discharge status: Home / Self Care | Payer: PPO | Attending: Emergency Medicine | Admitting: Emergency Medicine

## 2019-11-20 ENCOUNTER — Other Ambulatory Visit: Payer: Self-pay

## 2019-11-20 DIAGNOSIS — J811 Chronic pulmonary edema: Secondary | ICD-10-CM | POA: Diagnosis not present

## 2019-11-20 DIAGNOSIS — I130 Hypertensive heart and chronic kidney disease with heart failure and stage 1 through stage 4 chronic kidney disease, or unspecified chronic kidney disease: Secondary | ICD-10-CM | POA: Diagnosis not present

## 2019-11-20 DIAGNOSIS — E039 Hypothyroidism, unspecified: Secondary | ICD-10-CM | POA: Diagnosis not present

## 2019-11-20 DIAGNOSIS — R42 Dizziness and giddiness: Secondary | ICD-10-CM | POA: Diagnosis not present

## 2019-11-20 DIAGNOSIS — Z87891 Personal history of nicotine dependence: Secondary | ICD-10-CM | POA: Diagnosis not present

## 2019-11-20 DIAGNOSIS — Z7901 Long term (current) use of anticoagulants: Secondary | ICD-10-CM | POA: Insufficient documentation

## 2019-11-20 DIAGNOSIS — Z79899 Other long term (current) drug therapy: Secondary | ICD-10-CM | POA: Diagnosis not present

## 2019-11-20 DIAGNOSIS — G319 Degenerative disease of nervous system, unspecified: Secondary | ICD-10-CM | POA: Diagnosis not present

## 2019-11-20 DIAGNOSIS — I509 Heart failure, unspecified: Secondary | ICD-10-CM | POA: Insufficient documentation

## 2019-11-20 DIAGNOSIS — I517 Cardiomegaly: Secondary | ICD-10-CM | POA: Diagnosis not present

## 2019-11-20 DIAGNOSIS — G9389 Other specified disorders of brain: Secondary | ICD-10-CM | POA: Diagnosis not present

## 2019-11-20 DIAGNOSIS — E119 Type 2 diabetes mellitus without complications: Secondary | ICD-10-CM | POA: Insufficient documentation

## 2019-11-20 DIAGNOSIS — R569 Unspecified convulsions: Secondary | ICD-10-CM | POA: Diagnosis not present

## 2019-11-20 DIAGNOSIS — R55 Syncope and collapse: Secondary | ICD-10-CM | POA: Insufficient documentation

## 2019-11-20 DIAGNOSIS — N183 Chronic kidney disease, stage 3 unspecified: Secondary | ICD-10-CM | POA: Diagnosis not present

## 2019-11-20 DIAGNOSIS — I6529 Occlusion and stenosis of unspecified carotid artery: Secondary | ICD-10-CM | POA: Diagnosis not present

## 2019-11-20 DIAGNOSIS — I672 Cerebral atherosclerosis: Secondary | ICD-10-CM | POA: Diagnosis not present

## 2019-11-20 LAB — HEPATIC FUNCTION PANEL
ALT: 13 U/L (ref 0–44)
AST: 18 U/L (ref 15–41)
Albumin: 3.7 g/dL (ref 3.5–5.0)
Alkaline Phosphatase: 127 U/L — ABNORMAL HIGH (ref 38–126)
Bilirubin, Direct: 0.1 mg/dL (ref 0.0–0.2)
Indirect Bilirubin: 0.8 mg/dL (ref 0.3–0.9)
Total Bilirubin: 0.9 mg/dL (ref 0.3–1.2)
Total Protein: 9.1 g/dL — ABNORMAL HIGH (ref 6.5–8.1)

## 2019-11-20 LAB — CBC
HCT: 37.6 % — ABNORMAL LOW (ref 39.0–52.0)
Hemoglobin: 12.1 g/dL — ABNORMAL LOW (ref 13.0–17.0)
MCH: 31.3 pg (ref 26.0–34.0)
MCHC: 32.2 g/dL (ref 30.0–36.0)
MCV: 97.2 fL (ref 80.0–100.0)
Platelets: 146 10*3/uL — ABNORMAL LOW (ref 150–400)
RBC: 3.87 MIL/uL — ABNORMAL LOW (ref 4.22–5.81)
RDW: 14.5 % (ref 11.5–15.5)
WBC: 3.9 10*3/uL — ABNORMAL LOW (ref 4.0–10.5)
nRBC: 0 % (ref 0.0–0.2)

## 2019-11-20 LAB — BASIC METABOLIC PANEL
Anion gap: 9 (ref 5–15)
BUN: 22 mg/dL (ref 8–23)
CO2: 24 mmol/L (ref 22–32)
Calcium: 9.3 mg/dL (ref 8.9–10.3)
Chloride: 106 mmol/L (ref 98–111)
Creatinine, Ser: 1.46 mg/dL — ABNORMAL HIGH (ref 0.61–1.24)
GFR, Estimated: 52 mL/min — ABNORMAL LOW (ref >60)
Glucose, Bld: 89 mg/dL (ref 70–99)
Potassium: 3.8 mmol/L (ref 3.5–5.1)
Sodium: 139 mmol/L (ref 135–145)

## 2019-11-20 LAB — TROPONIN I (HIGH SENSITIVITY)
Troponin I (High Sensitivity): 23 ng/L — ABNORMAL HIGH (ref <18)
Troponin I (High Sensitivity): 25 ng/L — ABNORMAL HIGH (ref <18)

## 2019-11-20 NOTE — Discharge Instructions (Addendum)
I discussed your case with Dr. Harrington Challenger who is on-call for cardiology.  She suggests stopping the [facility] for now.  Please give Dr. Donivan Scull office a call in the morning let them know you are in the emergency room with near syncope and that we wanted you to get seen as soon as possible.  He should be out of see you very quickly, possibly even tomorrow

## 2019-11-20 NOTE — Telephone Encounter (Signed)
Spoke to ED physician  Pt presented with  dizziness, near syncope  Did not pass out  In ED BP initially low  98/   When called, pt was feeling OK  No SOB, no dizziness Wanted to go home  CXR with some vascular congestion   Trop low x 2 Plan to send pthome   I recomm  Entresto,be held    Pt should have f/u in clinic for BP and symptoms tomorrow if possible or next day.

## 2019-11-20 NOTE — ED Triage Notes (Signed)
Pt here with suspected seizure. Pt reports that he got dizzy and started seeing spots and does not remember what happened after. Pt states he think his blood pressure was too low. Pt NAD in triage.

## 2019-11-20 NOTE — ED Provider Notes (Addendum)
[facility] Emergency Department Provider Note   ____________________________________________   First MD Initiated Contact with Patient 11/20/19 1647     (approximate)  I have reviewed the triage vital signs and the nursing notes.   HISTORY  Chief Complaint Seizures    HPI [name] is a 69 y.o. male went to see patient about 8 minutes ago but he is not in his bed nurse does not know where he went.  We will continue looking for him.    ----------------------------------------- 5:20 PM on 11/20/2019 -----------------------------------------  Patient is now in 38 hallway.  He reports that his girlfriend had gone to the doctor and he was with her and he began getting dizzy and lightheaded got better and then got worse again to the point where he was seeing spots he is feeling better now.  He has never had anything like this before.  His girlfriend who is a retired Marine scientist thought he might be having seizures so brought him in here.  He sees Dr. Rockey Situ.  He has a history of A. fib and CHF diabetes hypertension etc. please see past medical history.  Patient had no chest pain or shortness of breath.  He did not lose consciousness.  Did not have any jerking or shaking.     Past Medical History:  Diagnosis Date  . (HFpEF) heart failure with preserved ejection fraction ([place])    a. 03/2019 cMRI: EF 30-40%, glob HK; b. 05/2019 Echo [facility]): EF 35%.  . CHF (congestive heart failure) ([place])   . CKD (chronic kidney disease), stage III ([place])   . Coumadin resistance ([place])   . Diabetes mellitus without complication ([place])   . Dysrhythmia    A. Fib  . Fatty liver   . H/O ETOH abuse   . Hepatic encephalopathy ([place])    history  . Hx of adenomatous polyp of colon   . Hx of thrombocytopenia   . Hypertension   . Hypothyroidism   . Macrocytic anemia   . Mixed hyperlipidemia   . NICM (nonischemic cardiomyopathy) ([place])    a. 03/2019 cMRI: EF 30-40%, glob HK. No LGE. No  evidence of infiltrative cardiomyopathy; b. 05/2019 Echo [facility]): EF 35%, mild to mod MR, sev dil LA, sev dil RV w/ mod reduced RV fxn. Sev TR. Mild-mod PAH (PASP 93mmHg), sev dil RA.  [name] Nonobstructive CAD (coronary artery disease)    a. 02/2019 [facility]): LM nl, LAD 62m, D1 small, D2 60ost, D3 small, LCX nl, OM2 25, RCA 33m, 25d.  [name] Persistent atrial fibrillation ([place])    a. CHA2DS2VASc = 5-->eliquis; b. 05/2019 s/p RFCA/PVI [facility]).  . Valvular heart disease    a. 05/2019 Echo [facility]): mild to mod MR, sev TR.    Patient Active Problem List   Diagnosis Date Noted  . Chronic venous insufficiency 10/26/2019  . Lymphedema 10/26/2019  . Hyperlipidemia 10/26/2019  . PAD (peripheral artery disease) ([place]) 10/26/2019  . Hypotension due to drugs 05/26/2019  . Cellulitis 04/24/2019  . Hypothyroidism 04/24/2019  . Acute renal failure superimposed on chronic kidney disease ([place]) 04/24/2019  . Hepatic encephalopathy ([place]) 04/05/2019  . Benign essential tremor 03/28/2019  . Dizziness 03/28/2019  . Essential hypertension 03/03/2019  . Alcoholic steatohepatitis 50/93/2671  . Adenomatous polyp of colon 02/11/2019  . AMS (altered mental status) 02/11/2019  . Elevated serum creatinine 02/11/2019  . Esophageal dysphagia 02/11/2019  . 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 Tremor 02/11/2019  .  Volume overload 02/11/2019  . MGUS (monoclonal gammopathy of unknown significance) 02/11/2019  . Pedal edema 02/07/2019  . Pain due to onychomycosis of toenails of both feet 07/25/2018  . Nonrheumatic mitral valve regurgitation 04/26/2018  . Nonrheumatic tricuspid valve regurgitation 04/26/2018  . Cardiomyopathy, nonischemic ([place]) 11/15/2015  . History of adenomatous polyp of colon 06/03/2015  . History of thrombocytopenia 06/03/2015  . Persistent atrial fibrillation ([place]) 04/28/2013  . HFrEF (heart failure with reduced ejection fraction) ([place]) 04/28/2013  . Coumadin  resistance ([place]) 04/28/2013  . Left ventricular dysfunction 04/28/2013    Past Surgical History:  Procedure Laterality Date  . CARDIAC CATHETERIZATION     no stents  . COLONOSCOPY    . COLONOSCOPY WITH PROPOFOL N/A 07/26/2015   Procedure: COLONOSCOPY WITH PROPOFOL;  Surgeon: Manya Silvas, MD;  Location: [facility] ENDOSCOPY;  Service: Endoscopy;  Laterality: N/A;  . COLONOSCOPY WITH PROPOFOL N/A 10/03/2019   Procedure: COLONOSCOPY WITH PROPOFOL;  Surgeon: Lesly Rubenstein, MD;  Location: [facility] ENDOSCOPY;  Service: Endoscopy;  Laterality: N/A;  . ESOPHAGOGASTRODUODENOSCOPY (EGD) WITH PROPOFOL N/A 10/03/2019   Procedure: ESOPHAGOGASTRODUODENOSCOPY (EGD) WITH PROPOFOL;  Surgeon: Lesly Rubenstein, MD;  Location: [facility] ENDOSCOPY;  Service: Endoscopy;  Laterality: N/A;  . HAMMER TOE SURGERY      Prior to Admission medications   Medication Sig Start Date End Date Taking? Authorizing Provider  apixaban (ELIQUIS) 5 MG TABS tablet Take by mouth 2 (two) times daily.  02/18/19   [provider]  atorvastatin (LIPITOR) 10 MG tablet Take 10 mg by mouth daily.    [provider]  clobetasol cream (TEMOVATE) 0.73 % Apply 1 application topically 2 (two) times daily.    [provider]  colchicine 0.6 MG tablet Take 0.6 mg by mouth 2 (two) times daily.     [provider]  ENTRESTO 24-26 MG TAKE 1 TABLET BY MOUTH EVERYDAY AT BEDTIME 10/30/19   Loel Dubonnet, NP  gabapentin (NEURONTIN) 300 MG capsule Take 300 mg by mouth 3 (three) times daily.  06/20/19   [provider]  ketoconazole (NIZORAL) 2 % cream Apply 1 application topically at bedtime. 10/16/19 06/12/20  Ralene Bathe, MD  lactobacillus acidophilus (BACID) TABS tablet Take 2 tablets by mouth 3 (three) times daily. 04/07/19   Nolberto Hanlon, MD  lactulose (CHRONULAC) 10 GM/15ML solution  05/03/19   [provider]  levothyroxine (SYNTHROID, LEVOTHROID) 100 MCG tablet Take 100 mcg by mouth every  morning. 10/29/17   [provider]  metoprolol succinate (TOPROL-XL) 25 MG 24 hr tablet Take 0.5 tablets (12.5 mg total) by mouth in the morning and at bedtime. 11/13/19   Minna Merritts, MD  mupirocin ointment (BACTROBAN) 2 % Apply 1 application topically daily. To any open sores or crusted areas of legs 10/16/19   Ralene Bathe, MD  pantoprazole (PROTONIX) 20 MG tablet Take 40 mg by mouth daily.  02/02/19   [provider]  potassium chloride SA (KLOR-CON) 20 MEQ tablet Take 1 tablet (20 mEq total) by mouth as directed. Take 1 tablet (20 mEq) once daily. Hold on Monday/Wednesdy/Friday 05/24/19   Minna Merritts, MD  sucralfate (CARAFATE) 1 g tablet Take 1 g by mouth 4 (four) times daily -  with meals and at bedtime.    [provider]  torsemide (DEMADEX) 20 MG tablet Take 1 tablet (20 mg total) by mouth as directed. Hold on Monday/Wednesdy/Friday 05/24/19 11/13/19  Minna Merritts, MD    Allergies Penicillins  Family History  Problem Relation Age of Onset  . Heart Problems Mother   . Heart Problems Father   . Heart Problems Sister     Social History Social History   Tobacco Use  . Smoking status: Former Research scientist (life sciences)  . Smokeless tobacco: Current User    Types: Snuff  Vaping Use  . Vaping Use: Never used  Substance Use Topics  . Alcohol use: No  . Drug use: No    Review of Systems  Constitutional: No fever/chills Eyes: Currently no visual changes. ENT: No sore throat. Cardiovascular: Denies chest pain. Respiratory: Denies shortness of breath. Gastrointestinal: No abdominal pain.  No nausea, no vomiting.  No diarrhea.  No constipation. Genitourinary: Negative for dysuria. Musculoskeletal: Negative for back pain. Skin: Negative for rash. Neurological: Negative for headaches, focal weakness   ____________________________________________   PHYSICAL EXAM:  VITAL SIGNS: ED Triage Vitals  Enc Vitals Group     BP 11/20/19 1336 127/69      Pulse Rate 11/20/19 1336 92     Resp 11/20/19 1336 18     Temp 11/20/19 1336 98 F (36.7 C)     Temp Source 11/20/19 1336 Oral     SpO2 11/20/19 1336 100 %     Weight 11/20/19 1337 202 lb (91.6 kg)     Height 11/20/19 1337 6\' 2"  (1.88 m)     Head Circumference --      Peak Flow --      Pain Score 11/20/19 1336 0     Pain Loc --      Pain Edu? --      Excl. in [place]? --     Constitutional: Alert and oriented. Well appearing and in no acute distress. Eyes: Conjunctivae are normal. PER EOMI. Head: Atraumatic. Nose: No congestion/rhinnorhea. Mouth/Throat: Mucous membranes are moist.  Oropharynx non-erythematous. Neck: No stridor.   Cardiovascular: Normal rate, regular rhythm. Grossly normal heart sounds.  Good peripheral circulation. Respiratory: Normal respiratory effort.  No retractions. Lungs CTAB. Gastrointestinal: Soft and nontender. No distention. No abdominal bruits.  Musculoskeletal: No lower extremity tenderness nor edema.  Neurologic:  Normal speech and language. No gross focal neurologic deficits are appreciated.  Cranial nerves II through XII are intact of the visual fields were not checked cerebellar finger-to-nose and rapid alternating movements in the hands are normal motor strength is 5/5 throughout patient does not report any numbness Skin:  Skin is warm, dry and intact. No rash noted. .  ____________________________________________   LABS (all labs ordered are listed, but only abnormal results are displayed)  Labs Reviewed  BASIC METABOLIC PANEL - Abnormal; Notable for the following components:      Result Value   Creatinine, Ser 1.46 (*)    GFR, Estimated 52 (*)    All other components within normal limits  CBC - Abnormal; Notable for the following components:   WBC 3.9 (*)    RBC 3.87 (*)    Hemoglobin 12.1 (*)    HCT 37.6 (*)    Platelets 146 (*)    All other components within normal limits  HEPATIC FUNCTION PANEL - Abnormal; Notable for the following  components:   Total Protein 9.1 (*)    Alkaline Phosphatase 127 (*)    All other components within normal limits  TROPONIN I (HIGH SENSITIVITY) - Abnormal; Notable for the following components:   Troponin I (High Sensitivity) 23 (*)    All other components within normal limits  TROPONIN I (HIGH SENSITIVITY) - Abnormal; Notable  for the following components:   Troponin I (High Sensitivity) 25 (*)    All other components within normal limits  CBG MONITORING, ED   ____________________________________________  EKG  EKG read interpreted by me shows A. fib at a rate of 83 normal axis right bundle branch block flipped T waves in V2 and V3 otherwise no acute changes there is 1 PVC as well.  EKG looks very similar to 1 from 08/07/2019 ____________________________________________  RADIOLOGY Gertha Calkin, personally viewed and evaluated these images (plain radiographs) as part of my medical decision making, as well as reviewing the written report by the radiologist.  ED MD interpretation: CT head read by radiology reviewed by me shows no acute disease  Official radiology report(s): DG Chest 2 View  Result Date: 11/20/2019 CLINICAL DATA:  Syncope.  Suspected seizure.  Dizziness today. EXAM: CHEST - 2 VIEW COMPARISON:  Radiograph 04/04/2019. FINDINGS: Cardiomegaly appears similar to prior exam. Unchanged mediastinal contours. Bronchial and interstitial thickening with suggestion of Kerley B-lines, suspicious for pulmonary edema. No significant pleural fluid. No focal airspace disease. Mild multilevel spondylosis without acute osseous abnormality. IMPRESSION: Cardiomegaly with probable mild pulmonary edema. This appears similar to March 2021 exam. Electronically Signed   By: Keith Rake M.D.   On: 11/20/2019 18:14   CT Head Wo Contrast  Result Date: 11/20/2019 CLINICAL DATA:  Possible seizure. EXAM: CT HEAD WITHOUT CONTRAST TECHNIQUE: Contiguous axial images were obtained from the base of  the skull through the vertex without intravenous contrast. COMPARISON:  None. FINDINGS: Brain: No evidence of acute infarction, hemorrhage, hydrocephalus, extra-axial collection or mass lesion/mass effect. Mild generalized cerebral atrophy. Vascular: Atherosclerotic vascular calcification of the carotid siphons. No hyperdense vessel. Skull: Normal. Negative for fracture or focal lesion. Sinuses/Orbits: No acute finding. Other: None. IMPRESSION: 1. No acute intracranial abnormality. Electronically Signed   By: Titus Dubin M.D.   On: 11/20/2019 17:15    ____________________________________________   PROCEDURES  Procedure(s) performed (including Critical Care):  Procedures   ____________________________________________   INITIAL IMPRESSION / ASSESSMENT AND PLAN / ED COURSE  Discussed patient with Dr. Harrington Challenger on-call for cardiology.  She suggest stopping the Entresto.  We will get him to see his doctor as soon as possible.  Usually Dr. Rockey Situ can see people even the next day.  Patient does not want to stay in the hospital he wants to go home.  Has been saying he is felt fine for the last several hours while getting the second troponin.  His second troponin is essentially unchanged from the first one.  He is not hypoxic no longer hypotensive and feels fine as I said.             ____________________________________________   FINAL CLINICAL IMPRESSION(S) / ED DIAGNOSES  Final diagnoses:  Near syncope     ED Discharge Orders    None      *Please note:  [name] was evaluated in Emergency Department on 11/20/2019 for the symptoms described in the history of present illness. He was evaluated in the context of the global COVID-19 pandemic, which necessitated consideration that the patient might be at risk for infection with the SARS-CoV-2 virus that causes COVID-19. Institutional protocols and algorithms that pertain to the evaluation of patients at risk for COVID-19 are in a  state of rapid change based on information released by regulatory bodies including the CDC and federal and state organizations. These policies and algorithms were followed during the patient's care in the  ED.  Some ED evaluations and interventions may be delayed as a result of limited staffing during and the pandemic.*   Note:  This document was prepared using Dragon voice recognition software and may include unintentional dictation errors.    Nena Polio, MD 11/20/19 1947    Nena Polio, MD 11/20/19 [phone]

## 2019-11-20 NOTE — ED Notes (Signed)
Called lab to ask that they run the troponin that was sent

## 2019-11-21 ENCOUNTER — Other Ambulatory Visit: Payer: Self-pay

## 2019-11-21 ENCOUNTER — Encounter: Payer: Self-pay | Admitting: Family

## 2019-11-21 ENCOUNTER — Ambulatory Visit: Payer: PPO | Admitting: Family

## 2019-11-21 VITALS — BP 112/60 | HR 84 | Ht 74.0 in | Wt 205.0 lb

## 2019-11-21 DIAGNOSIS — I428 Other cardiomyopathies: Secondary | ICD-10-CM | POA: Diagnosis not present

## 2019-11-21 DIAGNOSIS — I480 Paroxysmal atrial fibrillation: Secondary | ICD-10-CM | POA: Diagnosis not present

## 2019-11-21 DIAGNOSIS — Z7901 Long term (current) use of anticoagulants: Secondary | ICD-10-CM | POA: Diagnosis not present

## 2019-11-21 DIAGNOSIS — I472 Ventricular tachycardia: Secondary | ICD-10-CM | POA: Diagnosis not present

## 2019-11-21 DIAGNOSIS — R55 Syncope and collapse: Secondary | ICD-10-CM

## 2019-11-21 DIAGNOSIS — I5020 Unspecified systolic (congestive) heart failure: Secondary | ICD-10-CM | POA: Diagnosis not present

## 2019-11-21 DIAGNOSIS — I4819 Other persistent atrial fibrillation: Secondary | ICD-10-CM

## 2019-11-21 DIAGNOSIS — N1831 Chronic kidney disease, stage 3a: Secondary | ICD-10-CM

## 2019-11-21 DIAGNOSIS — I4729 Other ventricular tachycardia: Secondary | ICD-10-CM

## 2019-11-21 NOTE — Patient Instructions (Addendum)
Medication Instructions:  Your physician has recommended you make the following change in your medication:   STOP Entresto  *If you need a refill on your cardiac medications before your next appointment, please call your pharmacy*  Lab Work: No lab work today  Testing/Procedures: None ordered today.   Follow-Up: At [facility], you and your health needs are our priority.  As part of our continuing mission to provide you with exceptional heart care, we have created designated Provider Care Teams.  These Care Teams include your primary Cardiologist (physician) and Advanced Practice Providers (APPs -  Physician Assistants and Nurse Practitioners) who all work together to provide you with the care you need, when you need it.  We recommend signing up for the patient portal called "MyChart".  Sign up information is provided on this After Visit Summary.  MyChart is used to connect with patients for Virtual Visits (Telemedicine).  Patients are able to view lab/test results, encounter notes, upcoming appointments, etc.  Non-urgent messages can be sent to your provider as well.   To learn more about what you can do with MyChart, go to [email or web address].    Your next appointment:   April 2022 with Dr. Rockey Situ  The format for your next appointment:   In Person  Provider:   You may see Ida Rogue, MD or one of the following Advanced Practice Providers on your designated Care Team:    Murray Hodgkins, NP  Christell Faith, PA-C  Marrianne Mood, PA-C  Laurann Montana, NP  Cadence Kathlen Mody, [place]  Other Instructions   Call our office if you gain 3 pounds overnight or 5 pounds in one week.   Our goal is for your blood pressure to be 110-130/60-80  Our goal is for your heart rate to be less than 100 bpm when you are sitting and resting   Call our office if you have another episode lightheadedness, dizziness, episodes of almost passing out.   Continue to elevate your legs when  sitting.

## 2019-11-21 NOTE — Telephone Encounter (Signed)
Patient scheduled for this afternoon.

## 2019-11-21 NOTE — Progress Notes (Signed)
Office Visit    Patient Name: [name] Date of Encounter: 11/21/2019  Primary Care Provider:  Albina Billet, MD Primary Cardiologist:  Ida Rogue, MD Electrophysiologist:  None   Chief Complaint    [name] is a 69 y.o. male with a hx of persistent atrial fibrillation s/p catheter ablation with recurrence, HFrEF, nonischemic cardiomyopathy, cirrhosis, alcohol use, CKD 3, hypothyroidism, DM2, nonobstructive CAD, HTN, HLD, macrocytic anemia, smoldering myeloma, thrombocytopenia, venous insufficiency, lymphedema presents today for ED follow up.    Past Medical History    Past Medical History:  Diagnosis Date  . (HFpEF) heart failure with preserved ejection fraction ([place])    a. 03/2019 cMRI: EF 30-40%, glob HK; b. 05/2019 Echo [facility]): EF 35%.  . CHF (congestive heart failure) ([place])   . CKD (chronic kidney disease), stage III ([place])   . Coumadin resistance ([place])   . Diabetes mellitus without complication ([place])   . Dysrhythmia    A. Fib  . Fatty liver   . H/O ETOH abuse   . Hepatic encephalopathy ([place])    history  . Hx of adenomatous polyp of colon   . Hx of thrombocytopenia   . Hypertension   . Hypothyroidism   . Macrocytic anemia   . Mixed hyperlipidemia   . NICM (nonischemic cardiomyopathy) ([place])    a. 03/2019 cMRI: EF 30-40%, glob HK. No LGE. No evidence of infiltrative cardiomyopathy; b. 05/2019 Echo [facility]): EF 35%, mild to mod MR, sev dil LA, sev dil RV w/ mod reduced RV fxn. Sev TR. Mild-mod PAH (PASP 13mmHg), sev dil RA.  [name] Nonobstructive CAD (coronary artery disease)    a. 02/2019 Cath [facility]): LM nl, LAD 72m, D1 small, D2 60ost, D3 small, LCX nl, OM2 25, RCA 47m, 25d.  [name] Persistent atrial fibrillation ([place])    a. CHA2DS2VASc = 5-->eliquis; b. 05/2019 s/p RFCA/[facility]).  . Valvular heart disease    a. 05/2019 Echo [facility]): mild to mod MR, sev TR.   Past Surgical History:  Procedure Laterality Date  . CARDIAC CATHETERIZATION     no stents  . COLONOSCOPY    .  COLONOSCOPY WITH PROPOFOL N/A 07/26/2015   Procedure: COLONOSCOPY WITH PROPOFOL;  Surgeon: Manya Silvas, MD;  Location: [facility] ENDOSCOPY;  Service: Endoscopy;  Laterality: N/A;  . COLONOSCOPY WITH PROPOFOL N/A 10/03/2019   Procedure: COLONOSCOPY WITH PROPOFOL;  Surgeon: Lesly Rubenstein, MD;  Location: [facility] ENDOSCOPY;  Service: Endoscopy;  Laterality: N/A;  . ESOPHAGOGASTRODUODENOSCOPY (EGD) WITH PROPOFOL N/A 10/03/2019   Procedure: ESOPHAGOGASTRODUODENOSCOPY (EGD) WITH PROPOFOL;  Surgeon: Lesly Rubenstein, MD;  Location: [facility] ENDOSCOPY;  Service: Endoscopy;  Laterality: N/A;  . HAMMER TOE SURGERY      Allergies  Allergies  Allergen Reactions  . Penicillins Hives    History of Present Illness    [name] is a 69 y.o. male with a hx of persistent atrial fibrillation s/p catheter ablation with recurrence, HFrEF, nonischemic cardiomyopathy, cirrhosis, alcohol use, CKD 3, hypothyroidism, DM2, nonobstructive CAD, HTN, HLD, macrocytic anemia, smoldering myeloma, thrombocytopenia, venous insufficiency, lymphedema. He was last seen 11/13/19 by Dr. Rockey Situ.  He has undergone extensive work-up for cardiomyopathy at [facility] with diagnostic catheterization 02/2019 with minimal nonobstructive CAD.  Cardiac MRI 03/2019 revealed LVEF 30-40% with global hypokinesis.  No LGE or evidence of infiltrative cardiomyopathy.  He underwent atrial fibrillation ablation at [facility] 05/25/19.  His metoprolol was held due to hypotension but resumed on discharge.  His Entresto was stopped due to  hypotension and he was recommended to resume according to his outpatient physician.  Escalation of heart failure therapies has been limited by relative hypotension.  At clinic visit May 2021 his Delene Loll was added back at night only.  Clinically 07/14/19 his Toprol was increased to 37.5 mg daily due to recurrent atrial flutter after ablation at [facility].  He has decided against proceeding with cardioversion or repeat ablation.  Seen  11/13/19 by Dr. Rockey Situ. Due to hypotension and orthostasis symptoms his metoprolol was reduced from 37.5mg  twice daily to 12.5mg  twice daily. He declined repeat echocardiogram.  Seen in ED 11/20/19 with dizziness and near syncope. His initial BP was low, case discussed with Dr. Harrington Challenger. CXR showed some vascular congestion and HS-troponin were 23 and 25, he was recommended to hold Entresto and be seen in the office.   Presents today with his girlfriend.  He reports feeling well since leaving the ED.  No recurrent lightheadedness, dizziness, near syncope.  Tells me he was sitting in the car when his girlfriend noticed that his eyes rolled back and he began to see spots and felt very lightheaded.  This has not happened to him in the past.  He does note he may not have eaten or drank as much that day.  Checks blood pressure routinely at home with systolic readings 95A-213Y. HR routinely 55-85bpm. Weights daily with weight stable at 200 lbs on his home scale. No edema, orthopnea, PND.   EKGs/Labs/Other Studies Reviewed:   The following studies were reviewed today:   05/26/2019 TTE  LV moderately dilated in size with normal wall thickness.  LV systolic function severely decreased, LVEF visually estimated at 35%.  Mitral valve leaflets are mildly thickened with normal leaflet mobility  There is mild to moderate mitral valve regurgitation.  The left atrium is severely dilated in size.  The right ventricle is severely dilated in size, with moderately reduced systolic function.  There is severe tricuspid regurgitation.  There is mild-moderate pulmonary hypertension, estimated pulmonary artery systolic pressure is 41 mmHg.  The right atrium is severely dilated in size.  IVC size and inspiratory change suggest elevated right atrial pressure. (10-20 mmHg).   02/11/2019 TTE 1. The left ventricle is mildly dilated in size with normal wall thickness. 2. LV systolic function severely decreased, LVEF  estimated 25-30% 3. Mitral valve leaflets are mildly thickened with normal leaflet mobility. 4. There is moderate mitral valve regurgitation. 5. Aortic valve is trileaflet with mildly thickened leaflets, normal excursion 6. There is mild aortic regurgitation. 7. The left atrium is moderately dilated in size. 8. RV is severely dilated in size, with reduced systolic function. 9. There is severe tricuspid regurgitation. 10. There is severe pulmonary hypertension, estimated pulmonary artery systolic pressure is 77 mmHg. 11. The right atrium is severely dilated in size. 12. IVC size and inspiratory change suggest elevated right atrial pressure. (10-20 mmHg).  **  Cardiac CT/MRI/Nuclear Tests:  02/14/2019 PET Stress - Abnormal myocardial perfusion study - There is a small in size, mild in severity, nearly completely reversible defect involving the apical and apical anterior segments. This is consistent with scar with peri-infarct ischemia. - There is a small in size, mild in severity, partially reversible defect involving the basal inferolateral and basal inferolateral segments. This is consistent with scar and liminal peri-infarct ischemia. Cannot rule out artifact. - During stress: Global systolic function is severely reduced. The ejection fraction calculated at 26%. The left ventricle is dilated. Right ventricular chamber size is severely dilated. Right  ventricular systolic function is reduced. - 3 vessel coronary calcifications are noted - Small bilateral pleural effusions and large volume ascites are noted - Incidentally noted is a 3 mm nodule in the right upper lobe (series 6, image 19). Comparison with outside films is recommended if available. If not available, then by Fleischner criteria consider a dedicated chest CT in 12 months if cancer risk factors ARE present.   EKG:  No EKG today.  Recent Labs: 04/05/2019: TSH 2.342 04/10/2019: Magnesium 2.4 11/20/2019: ALT 13; BUN 22; Creatinine,  Ser 1.46; Hemoglobin 12.1; Platelets 146; Potassium 3.8; Sodium 139  Recent Lipid Panel No results found for: CHOL, TRIG, HDL, CHOLHDL, VLDL, LDLCALC, LDLDIRECT  Home Medications   Current Meds  Medication Sig  . apixaban (ELIQUIS) 5 MG TABS tablet Take by mouth 2 (two) times daily.   [name] atorvastatin (LIPITOR) 10 MG tablet Take 10 mg by mouth daily.  . clobetasol cream (TEMOVATE) 0.17 % Apply 1 application topically 2 (two) times daily.  . colchicine 0.6 MG tablet Take 0.6 mg by mouth 2 (two) times daily.   [name] gabapentin (NEURONTIN) 300 MG capsule Take 300 mg by mouth 3 (three) times daily.   [name] ketoconazole (NIZORAL) 2 % cream Apply 1 application topically at bedtime.  . lactobacillus acidophilus (BACID) TABS tablet Take 2 tablets by mouth 3 (three) times daily.  [name] lactulose (CHRONULAC) 10 GM/15ML solution   . levothyroxine (SYNTHROID, LEVOTHROID) 100 MCG tablet Take 100 mcg by mouth every morning.  . metoprolol succinate (TOPROL-XL) 25 MG 24 hr tablet Take 0.5 tablets (12.5 mg total) by mouth in the morning and at bedtime.  . mupirocin ointment (BACTROBAN) 2 % Apply 1 application topically daily. To any open sores or crusted areas of legs  . pantoprazole (PROTONIX) 20 MG tablet Take 40 mg by mouth daily.   . potassium chloride SA (KLOR-CON) 20 MEQ tablet Take 1 tablet (20 mEq total) by mouth as directed. Take 1 tablet (20 mEq) once daily. Hold on Monday/Wednesdy/Friday  . torsemide (DEMADEX) 20 MG tablet Take 1 tablet (20 mg total) by mouth as directed. Hold on Monday/Wednesdy/Friday     Review of Systems  All other systems reviewed and are otherwise negative except as noted above.  Physical Exam    VS:  BP 112/60 (BP Location: Left Arm, Patient Position: Sitting, Cuff Size: Normal)   Pulse 84   Ht 6\' 2"  (1.88 m)   Wt 205 lb (93 kg)   SpO2 93%   BMI 26.32 kg/m  , BMI Body mass index is 26.32 kg/m.  Wt Readings from Last 3 Encounters:  11/21/19 205 lb (93 kg)  11/20/19 202 lb  (91.6 kg)  11/13/19 206 lb (93.4 kg)   GEN: Well nourished, well developed, in no acute distress. HEENT: normal. Neck: Supple, no JVD, carotid bruits, or masses. Cardiac: RRR, no murmurs, rubs, or gallops. No clubbing, cyanosis, edema.  Radials/DP/PT 2+ and equal bilaterally.  Respiratory:  Respirations regular and unlabored, clear to auscultation bilaterally. GI: Soft, nontender, nondistended. MS: No deformity or atrophy. Skin: Warm and dry, no rash. Neuro:  Strength and sensation are intact. Psych: Normal affect.  Assessment & Plan    1. Near syncope -likely precluded by hypotension. We will stop his Entresto. Reports BP at home routinely less than 110/60. Low suspicion significant pause as he reports heart rate at home routinely 55 bpm to 85 bpm. If he has recurrent episode of near syncope we will plan to place ZIO monitor. Plan  of care discussed with Dr. Rockey Situ in clinic today lab work yesterday showed stable renal function normal electrolytes, hemoglobin 12.0.  2. Elevated troponin - HS-troponin 23 and 25 in ED yesterday. He is without chest pain, pressure, tightness. ED performed EKG which showed rate controlled atrial fib with RBBB and no acute ST/T wave changes. No indication for ischemic evaluation this time.  3. Persistent atrial fibrillation/flutter with variable block -s/p catheter ablation at [facility] 05/2019.  Recurrent atrial flutter noted 07/14/2019. Asymptomatic with no palpitations. He has declined further cardioversion or ablation. Continue Toprol 12.5 mg daily. Continue Eliquis 5 mg twice daily. Does not meet dose reduction criteria. Denies bleeding complications.  4. HFrEF/nonischemic cardiomyopathy - Euvolemic on exam. Continue Torsmide 20mg  four days per week. Discontinue Entresto due to hypotension and lightheadedness. Future considerations include addition of low-dose losartan or lisinopril but will defer today.  5. HTN / Hypotension- Episode of near syncope and  lightheadedness likely due to hypotension. Discontinue Entresto.   6. HLD - Continue Atorvastatin 10mg  daily.  7. CKD 3a - Renal function 11/20/19 creatinine 1.46, GFR 52. Careful titration of diuretics and antihypertensives.   8. Hepatic cirrhosis- Continue to follow with GI  9. Smoldering myeloma / Thrombocytopenia - Continue to follow with Dr. Grayland Ormond. Lab work upcoming this week with appointment next week.   10. Venous insufficiency / Lymphedema - Continue to follow with vascular. Elevate lower extremities when sitting.   Disposition: Follow up April 2022 with Dr. Rockey Situ or APP   Signed, Loel Dubonnet, NP 11/21/2019, 3:21 PM [place]

## 2019-11-24 ENCOUNTER — Inpatient Hospital Stay: Payer: PPO | Attending: Oncology

## 2019-11-24 DIAGNOSIS — E039 Hypothyroidism, unspecified: Secondary | ICD-10-CM | POA: Diagnosis not present

## 2019-11-24 DIAGNOSIS — C9000 Multiple myeloma not having achieved remission: Secondary | ICD-10-CM | POA: Diagnosis not present

## 2019-11-24 DIAGNOSIS — Z7901 Long term (current) use of anticoagulants: Secondary | ICD-10-CM | POA: Diagnosis not present

## 2019-11-24 DIAGNOSIS — I5032 Chronic diastolic (congestive) heart failure: Secondary | ICD-10-CM | POA: Insufficient documentation

## 2019-11-24 DIAGNOSIS — D649 Anemia, unspecified: Secondary | ICD-10-CM | POA: Insufficient documentation

## 2019-11-24 DIAGNOSIS — E119 Type 2 diabetes mellitus without complications: Secondary | ICD-10-CM | POA: Insufficient documentation

## 2019-11-24 DIAGNOSIS — N183 Chronic kidney disease, stage 3 unspecified: Secondary | ICD-10-CM | POA: Insufficient documentation

## 2019-11-24 DIAGNOSIS — Z87891 Personal history of nicotine dependence: Secondary | ICD-10-CM | POA: Diagnosis not present

## 2019-11-24 DIAGNOSIS — I2510 Atherosclerotic heart disease of native coronary artery without angina pectoris: Secondary | ICD-10-CM | POA: Insufficient documentation

## 2019-11-24 DIAGNOSIS — D472 Monoclonal gammopathy: Secondary | ICD-10-CM

## 2019-11-24 DIAGNOSIS — E782 Mixed hyperlipidemia: Secondary | ICD-10-CM | POA: Diagnosis not present

## 2019-11-24 DIAGNOSIS — I110 Hypertensive heart disease with heart failure: Secondary | ICD-10-CM | POA: Diagnosis not present

## 2019-11-24 DIAGNOSIS — Z79899 Other long term (current) drug therapy: Secondary | ICD-10-CM | POA: Diagnosis not present

## 2019-11-24 DIAGNOSIS — I4891 Unspecified atrial fibrillation: Secondary | ICD-10-CM | POA: Diagnosis not present

## 2019-11-24 LAB — CBC WITH DIFFERENTIAL/PLATELET
Abs Immature Granulocytes: 0.02 10*3/uL (ref 0.00–0.07)
Basophils Absolute: 0 10*3/uL (ref 0.0–0.1)
Basophils Relative: 1 %
Eosinophils Absolute: 0.1 10*3/uL (ref 0.0–0.5)
Eosinophils Relative: 1 %
HCT: 37.1 % — ABNORMAL LOW (ref 39.0–52.0)
Hemoglobin: 12.1 g/dL — ABNORMAL LOW (ref 13.0–17.0)
Immature Granulocytes: 1 %
Lymphocytes Relative: 23 %
Lymphs Abs: 1 10*3/uL (ref 0.7–4.0)
MCH: 31.2 pg (ref 26.0–34.0)
MCHC: 32.6 g/dL (ref 30.0–36.0)
MCV: 95.6 fL (ref 80.0–100.0)
Monocytes Absolute: 0.5 10*3/uL (ref 0.1–1.0)
Monocytes Relative: 12 %
Neutro Abs: 2.7 10*3/uL (ref 1.7–7.7)
Neutrophils Relative %: 62 %
Platelets: 124 10*3/uL — ABNORMAL LOW (ref 150–400)
RBC: 3.88 MIL/uL — ABNORMAL LOW (ref 4.22–5.81)
RDW: 14.6 % (ref 11.5–15.5)
WBC: 4.4 10*3/uL (ref 4.0–10.5)
nRBC: 0 % (ref 0.0–0.2)

## 2019-11-24 LAB — BASIC METABOLIC PANEL
Anion gap: 10 (ref 5–15)
BUN: 25 mg/dL — ABNORMAL HIGH (ref 8–23)
CO2: 24 mmol/L (ref 22–32)
Calcium: 9.3 mg/dL (ref 8.9–10.3)
Chloride: 106 mmol/L (ref 98–111)
Creatinine, Ser: 1.58 mg/dL — ABNORMAL HIGH (ref 0.61–1.24)
GFR, Estimated: 47 mL/min — ABNORMAL LOW (ref >60)
Glucose, Bld: 118 mg/dL — ABNORMAL HIGH (ref 70–99)
Potassium: 3.7 mmol/L (ref 3.5–5.1)
Sodium: 140 mmol/L (ref 135–145)

## 2019-11-25 LAB — IGG, IGA, IGM
IgA: 445 mg/dL — ABNORMAL HIGH (ref 61–437)
IgG (Immunoglobin G), Serum: 3017 mg/dL — ABNORMAL HIGH (ref 603–1613)
IgM (Immunoglobulin M), Srm: 103 mg/dL (ref 20–172)

## 2019-11-26 NOTE — Progress Notes (Signed)
[place]  [phone]) [phone] Fax:([phone]) [phone]  ID: PETER KEYWORTH OB: 1950/07/06  MR#: [id number]  TFT#:[id number]  Patient Care Team: Albina Billet, MD as PCP - General (Internal Medicine) Minna Merritts, MD as PCP - Cardiology (Cardiology) Lloyd Huger, MD as Consulting Physician (Oncology)  CHIEF COMPLAINT: Smoldering myeloma.  INTERVAL HISTORY: Patient returns to clinic for further evaluation and discussion of his laboratory work. He currently feel well. He has no neurologic complaints. He denies any recent fevers or illnesses. He has a fair appetite.  He denies any chest pain, shortness of breath, cough, or hemoptysis.  He denies any nausea, vomiting, constipation, or diarrhea.  He has no melena or hematochezia.  He has no urinary complaints. His peripheral edema has nearly resolved. Patient offers no further specific complaints.   REVIEW OF SYSTEMS:   Review of Systems  Constitutional: Positive for malaise/fatigue. Negative for fever.  Respiratory: Negative.  Negative for cough, hemoptysis and shortness of breath.   Cardiovascular: Negative.  Negative for chest pain and leg swelling.  Gastrointestinal: Negative.  Negative for abdominal pain.  Genitourinary: Negative.  Negative for dysuria.  Musculoskeletal: Negative.  Negative for back pain.  Skin: Negative.  Negative for rash.  Neurological: Positive for weakness. Negative for dizziness, focal weakness and headaches.  Psychiatric/Behavioral: Negative.  The patient is not nervous/anxious.     As per HPI. Otherwise, a complete review of systems is negative.  PAST MEDICAL HISTORY: Past Medical History:  Diagnosis Date  . (HFpEF) heart failure with preserved ejection fraction ([place])    a. 03/2019 cMRI: EF 30-40%, glob HK; b. 05/2019 Echo [facility]): EF 35%.  . CHF (congestive heart failure) ([place])   . CKD (chronic kidney disease), stage III ([place])   . Coumadin resistance ([place])   . Diabetes  mellitus without complication ([place])   . Dysrhythmia    A. Fib  . Fatty liver   . H/O ETOH abuse   . Hepatic encephalopathy ([place])    history  . Hx of adenomatous polyp of colon   . Hx of thrombocytopenia   . Hypertension   . Hypothyroidism   . Macrocytic anemia   . Mixed hyperlipidemia   . NICM (nonischemic cardiomyopathy) ([place])    a. 03/2019 cMRI: EF 30-40%, glob HK. No LGE. No evidence of infiltrative cardiomyopathy; b. 05/2019 Echo [facility]): EF 35%, mild to mod MR, sev dil LA, sev dil RV w/ mod reduced RV fxn. Sev TR. Mild-mod PAH (PASP 1mHg), sev dil RA.  .Marland KitchenNonobstructive CAD (coronary artery disease)    a. 02/2019 Cath ([facility]: LM nl, LAD 478mD1 small, D2 60ost, D3 small, LCX nl, OM2 25, RCA 4097m5d.  . PMarland Kitchenrsistent atrial fibrillation ([place]  a. CHA2DS2VASc = 5-->eliquis; b. [facility] . Valvular heart disease    a. [facility], sev TR.    PAST SURGICAL HISTORY: Past Surgical History:  Procedure Laterality Date  . CARDIAC CATHETERIZATION     no stents  . COLONOSCOPY    . COLONOSCOPY WITH PROPOFOL N/A 07/26/2015   Procedure: COLONOSCOPY WITH PROPOFOL;  Surgeon: RobManya SilvasD;  [facility];  Service: Endoscopy;  Laterality: N/A;  . COLONOSCOPY WITH PROPOFOL N/A 10/03/2019   Procedure: COLONOSCOPY WITH PROPOFOL;  Surgeon: LocLesly RubensteinD;  Location: [facility] ENDOSCOPY;  Service: Endoscopy;  Laterality: N/A;  . ESOPHAGOGASTRODUODENOSCOPY (EGD) WITH PROPOFOL N/A 10/03/2019   Procedure: ESOPHAGOGASTRODUODENOSCOPY (EGD) WITH PROPOFOL;  Surgeon: Lesly Rubenstein, MD;  Location[facility] ENDOSCOPY;  Service: Endoscopy;  Laterality: N/A;  . HAMMER TOE SURGERY      FAMILY HISTORY: Family History  Problem Relation Age of Onset  . Heart Problems Mother   . Heart Problems Father   . Heart Problems Sister     ADVANCED DIRECTIVES (Y/N):  N  HEALTH MAINTENANCE: Social History   Tobacco Use  . Smoking status: Former Research scientist (life sciences)   . Smokeless tobacco: Current User    Types: Snuff  Vaping Use  . Vaping Use: Never used  Substance Use Topics  . Alcohol use: No  . Drug use: No     Colonoscopy:  PAP:  Bone density:  Lipid panel:  Allergies  Allergen Reactions  . Penicillins Hives    Current Outpatient Medications  Medication Sig Dispense Refill  . apixaban (ELIQUIS) 5 MG TABS tablet Take by mouth 2 (two) times daily.     [name] atorvastatin (LIPITOR) 10 MG tablet Take 10 mg by mouth daily.    . clobetasol cream (TEMOVATE) 4.19 % Apply 1 application topically 2 (two) times daily.    . colchicine 0.6 MG tablet Take 0.6 mg by mouth 2 (two) times daily.     [name] gabapentin (NEURONTIN) 300 MG capsule Take 300 mg by mouth 3 (three) times daily.     [name] ketoconazole (NIZORAL) 2 % cream Apply 1 application topically at bedtime. 60 g 3  . lactobacillus acidophilus (BACID) TABS tablet Take 2 tablets by mouth 3 (three) times daily. 30 tablet 0  . lactulose (CHRONULAC) 10 GM/15ML solution     . levothyroxine (SYNTHROID, LEVOTHROID) 100 MCG tablet Take 100 mcg by mouth every morning.  4  . metoprolol succinate (TOPROL-XL) 25 MG 24 hr tablet Take 0.5 tablets (12.5 mg total) by mouth in the morning and at bedtime. 90 tablet 3  . mupirocin ointment (BACTROBAN) 2 % Apply 1 application topically daily. To any open sores or crusted areas of legs 22 g 1  . pantoprazole (PROTONIX) 20 MG tablet Take 40 mg by mouth daily.     . potassium chloride SA (KLOR-CON) 20 MEQ tablet Take 1 tablet (20 mEq total) by mouth as directed. Take 1 tablet (20 mEq) once daily. Hold on Monday/Wednesdy/Friday 180 tablet 3  . torsemide (DEMADEX) 20 MG tablet Take 1 tablet (20 mg total) by mouth as directed. Hold on Monday/Wednesdy/Friday 90 tablet 3   No current facility-administered medications for this visit.    OBJECTIVE: Vitals:   12/01/19 1046  BP: 123/88  Pulse: 86  Resp: 16  Temp: (!) 96 F (35.6 C)  SpO2: 100%     Body mass index is 26.64  kg/m.    ECOG FS:1 - Symptomatic but completely ambulatory  General: Well-developed, well-nourished, no acute distress. Eyes: Pink conjunctiva, anicteric sclera. HEENT: Normocephalic, moist mucous membranes. Lungs: No audible wheezing or coughing. Heart: Regular rate and rhythm. Abdomen: Soft, nontender, no obvious distention. Musculoskeletal: No edema, cyanosis, or clubbing. Neuro: Alert, answering all questions appropriately. Cranial nerves grossly intact. Skin: No rashes or petechiae noted. Psych: Normal affect.   LAB RESULTS:  Lab Results  Component Value Date   NA 140 11/24/2019   K 3.7 11/24/2019   CL 106 11/24/2019   CO2 24 11/24/2019   GLUCOSE 118 (H) 11/24/2019   BUN 25 (H) 11/24/2019   CREATININE 1.58 (H) 11/24/2019   CALCIUM 9.3 11/24/2019   PROT 9.1 (H) 11/20/2019   ALBUMIN 3.7 11/20/2019   AST  18 11/20/2019   ALT 13 11/20/2019   ALKPHOS 127 (H) 11/20/2019   BILITOT 0.9 11/20/2019   GFRNONAA 47 (L) 11/24/2019   GFRAA 47 (L) 08/02/2019    Lab Results  Component Value Date   WBC 4.4 11/24/2019   NEUTROABS 2.7 11/24/2019   HGB 12.1 (L) 11/24/2019   HCT 37.1 (L) 11/24/2019   MCV 95.6 11/24/2019   PLT 124 (L) 11/24/2019   Lab Results  Component Value Date   TOTALPROTELP 9.1 (H) 08/02/2019   ALBUMINELP 3.9 08/02/2019   A1GS 0.3 08/02/2019   A2GS 0.6 08/02/2019   BETS 1.2 08/02/2019   GAMS 3.2 (H) 08/02/2019   MSPIKE 2.6 (H) 08/02/2019   SPEI Comment 08/02/2019      STUDIES: DG Chest 2 View  Result Date: 11/20/2019 CLINICAL DATA:  Syncope.  Suspected seizure.  Dizziness today. EXAM: CHEST - 2 VIEW COMPARISON:  Radiograph 04/04/2019. FINDINGS: Cardiomegaly appears similar to prior exam. Unchanged mediastinal contours. Bronchial and interstitial thickening with suggestion of Kerley B-lines, suspicious for pulmonary edema. No significant pleural fluid. No focal airspace disease. Mild multilevel spondylosis without acute osseous abnormality. IMPRESSION:  Cardiomegaly with probable mild pulmonary edema. This appears similar to March 2021 exam. Electronically Signed   By: Keith Rake M.D.   On: 11/20/2019 18:14   CT Head Wo Contrast  Result Date: 11/20/2019 CLINICAL DATA:  Possible seizure. EXAM: CT HEAD WITHOUT CONTRAST TECHNIQUE: Contiguous axial images were obtained from the base of the skull through the vertex without intravenous contrast. COMPARISON:  None. FINDINGS: Brain: No evidence of acute infarction, hemorrhage, hydrocephalus, extra-axial collection or mass lesion/mass effect. Mild generalized cerebral atrophy. Vascular: Atherosclerotic vascular calcification of the carotid siphons. No hyperdense vessel. Skull: Normal. Negative for fracture or focal lesion. Sinuses/Orbits: No acute finding. Other: None. IMPRESSION: 1. No acute intracranial abnormality. Electronically Signed   By: Titus Dubin M.D.   On: 11/20/2019 17:15   VAS [place] ABI WITH/WO TBI  Result Date: 11/06/2019 LOWER EXTREMITY DOPPLER STUDY Indications: Rest pain.  Performing Technologist: Charlane Ferretti RT (R)(VS)  Examination Guidelines: A complete evaluation includes at minimum, Doppler waveform signals and systolic blood pressure reading at the level of bilateral brachial, anterior tibial, and posterior tibial arteries, when vessel segments are accessible. Bilateral testing is considered an integral part of a complete examination. Photoelectric Plethysmograph (PPG) waveforms and toe systolic pressure readings are included as required and additional duplex testing as needed. Limited examinations for reoccurring indications may be performed as noted.  ABI Findings: +---------+------------------+-----+---------+----------------+ Right    Rt Pressure (mmHg)IndexWaveform Comment          +---------+------------------+-----+---------+----------------+ Brachial 127                                              +---------+------------------+-----+---------+----------------+  ATA      137               1.08 triphasic                 +---------+------------------+-----+---------+----------------+ PTA                             triphasicNon compressaBLE +---------+------------------+-----+---------+----------------+ Great Toe179               1.41 Normal                    +---------+------------------+-----+---------+----------------+ +---------+------------------+-----+---------+-------+  Left     Lt Pressure (mmHg)IndexWaveform Comment +---------+------------------+-----+---------+-------+ Brachial 123                                     +---------+------------------+-----+---------+-------+ ATA                             triphasicNC      +---------+------------------+-----+---------+-------+ PTA                             triphasicNC      +---------+------------------+-----+---------+-------+ Great Toe146               1.15 Normal           +---------+------------------+-----+---------+-------+  Summary: Right: Resting right ankle-brachial index indicates noncompressible right lower extremity arteries. The right toe-brachial index is normal. Left: Resting left ankle-brachial index indicates noncompressible left lower extremity arteries. The left toe-brachial index is normal.  *See table(s) above for measurements and observations.  Electronically signed by Hortencia Pilar MD on 11/06/2019 at 5:09:17 PM.   Final    VAS [place] LOWER EXTREMITY VENOUS REFLUX  Result Date: 11/06/2019  Lower Venous Reflux Study Indications: Pain, Swelling, and Erythema.  Performing Technologist: Charlane Ferretti RT (R)(VS)  Examination Guidelines: A complete evaluation includes B-mode imaging, spectral Doppler, color Doppler, and power Doppler as needed of all accessible portions of each vessel. Bilateral testing is considered an integral part of a complete examination. Limited examinations for reoccurring indications may be performed as noted. The reflux  portion of the exam is performed with the patient in reverse Trendelenburg. Significant venous reflux is defined as >500 ms in the superficial venous system, and >1 second in the deep venous system.  Venous Reflux Times +--------------+---------+----------+-----------+------------+--------+ RIGHT         Reflux NoReflux YesReflux TimeDiameter cmsComments +--------------+---------+----------+-----------+------------+--------+ CFV                       yes      1496 ms                       +--------------+---------+----------+-----------+------------+--------+ FV mid                    yes      2362 ms                       +--------------+---------+----------+-----------+------------+--------+ Popliteal                 yes      1966 ms                       +--------------+---------+----------+-----------+------------+--------+ GSV at SFJ                yes      1680 ms      .39              +--------------+---------+----------+-----------+------------+--------+ GSV prox thighno                                .33              +--------------+---------+----------+-----------+------------+--------+ GSV mid thigh no                                .  27              +--------------+---------+----------+-----------+------------+--------+ GSV dist thighno                                .33              +--------------+---------+----------+-----------+------------+--------+ GSV at knee   no                                .30              +--------------+---------+----------+-----------+------------+--------+ GSV prox calf                                   .29              +--------------+---------+----------+-----------+------------+--------+ SSV Pop Fossa no                                .23              +--------------+---------+----------+-----------+------------+--------+   +--------------+---------+----------+-----------+------------+---------------+ LEFT          Reflux NoReflux YesReflux TimeDiameter cmsComments        +--------------+---------+----------+-----------+------------+---------------+ CFV                       yes      1819 ms                              +--------------+---------+----------+-----------+------------+---------------+ FV mid        no                                                        +--------------+---------+----------+-----------+------------+---------------+ Popliteal     no                                                        +--------------+---------+----------+-----------+------------+---------------+ GSV at [facility]                yes      506 ms       1.05                    +--------------+---------+----------+-----------+------------+---------------+ GSV prox thigh            yes      2318 ms      .52                     +--------------+---------+----------+-----------+------------+---------------+ GSV mid thigh             yes      1562 ms      .30                     +--------------+---------+----------+-----------+------------+---------------+ GSV dist thighno                                .  25                     +--------------+---------+----------+-----------+------------+---------------+ GSV at knee   no                                .19                     +--------------+---------+----------+-----------+------------+---------------+ GSV prox calf                                   .26                     +--------------+---------+----------+-----------+------------+---------------+ SSV Pop Fossa no                                .42     Wall thickening +--------------+---------+----------+-----------+------------+---------------+  Summary: Bilateral: - No evidence of deep vein thrombosis seen in the lower extremities, bilaterally, from the common femoral  through the popliteal veins. - No evidence of superficial venous thrombosis in the lower extremities, bilaterally.  Right: - Venous reflux is noted in the right common femoral vein. - Venous reflux is noted in the right greater saphenous vein in the thigh. - Venous reflux is noted in the right popliteal vein.  Left: - Venous reflux is noted in the left common femoral vein. - Venous reflux is noted in the left sapheno-femoral junction. - Venous reflux is noted in the left greater saphenous vein in the thigh. - Left SSV demonstrates wall thickening.  *See table(s) above for measurements and observations. Electronically signed by Hortencia Pilar MD on 11/06/2019 at 5:09:22 PM.    Final     ASSESSMENT: Smoldering myeloma  PLAN:    1.  Smoldering myeloma: Bone marrow biopsy completed at [facility] on February 14, 2019 revealed approximately 26% plasma cells in aspirate.  FISH and cytogenetics are unknown. Metastatic bone survey on January 09, 2019 did not reveal any suspicious bony lesions. His most recent M spike and IgG component are elevated, but essentially unchanged at 2.6 and 3017 respectively. Kappa free light chains have trended up to 1119.6. His platelet count and hemoglobin have both improved. Can consider PET scan in the near future to look for occult lesions otherwise, no intervention is needed at this time. No treatment is needed at this time, but patient expressed understanding that he may require intervention in the future. Return to clinic in 4 months for repeat laboratory work and further evaluation.  2.  Thrombocytopenia: Improved to 124.  Patient noted to have a decreased platelet count since at least July 2017. Monitor. 3.  Hyperbilirubinemia: Patient's most recent laboratory work at an outside facility revealed a bilirubin of 1.8.  Unclear etiology.  Continue to monitor. 4.  Anemia: Hemoglobin improved to 12.1. 5.  Renal insufficiency: Chronic and unchanged. 6.  Cardiac disease: Patient has  elected for medical management only. Continue follow-up and treatment per cardiology.    Patient expressed understanding and was in agreement with this plan. He also understands that He can call clinic at any time with any questions, concerns, or complaints.    Lloyd Huger, MD   12/03/2019 8:23 AM

## 2019-11-27 LAB — KAPPA/LAMBDA LIGHT CHAINS
Kappa free light chain: 1119.6 mg/L — ABNORMAL HIGH (ref 3.3–19.4)
Kappa, lambda light chain ratio: 35.43 — ABNORMAL HIGH (ref 0.26–1.65)
Lambda free light chains: 31.6 mg/L — ABNORMAL HIGH (ref 5.7–26.3)

## 2019-11-28 ENCOUNTER — Other Ambulatory Visit: Payer: PPO

## 2019-12-01 ENCOUNTER — Other Ambulatory Visit: Payer: Self-pay

## 2019-12-01 ENCOUNTER — Inpatient Hospital Stay: Payer: PPO | Admitting: Oncology

## 2019-12-01 VITALS — BP 123/88 | HR 86 | Temp 96.0°F | Resp 16 | Wt 207.5 lb

## 2019-12-01 DIAGNOSIS — C9000 Multiple myeloma not having achieved remission: Secondary | ICD-10-CM

## 2019-12-01 DIAGNOSIS — D472 Monoclonal gammopathy: Secondary | ICD-10-CM

## 2019-12-05 ENCOUNTER — Ambulatory Visit: Payer: PPO | Admitting: Oncology

## 2019-12-25 ENCOUNTER — Telehealth: Payer: Self-pay | Admitting: Cardiovascular Disease

## 2019-12-25 ENCOUNTER — Other Ambulatory Visit: Payer: Self-pay

## 2019-12-25 ENCOUNTER — Emergency Department: Payer: PPO

## 2019-12-25 ENCOUNTER — Emergency Department
Admission: EM | Admit: 2019-12-25 | Discharge: 2019-12-25 | Disposition: A | Discharge status: Home / Self Care | Payer: PPO | Attending: Emergency Medicine | Admitting: Emergency Medicine

## 2019-12-25 DIAGNOSIS — N183 Chronic kidney disease, stage 3 unspecified: Secondary | ICD-10-CM | POA: Insufficient documentation

## 2019-12-25 DIAGNOSIS — E039 Hypothyroidism, unspecified: Secondary | ICD-10-CM | POA: Diagnosis not present

## 2019-12-25 DIAGNOSIS — R42 Dizziness and giddiness: Secondary | ICD-10-CM | POA: Diagnosis not present

## 2019-12-25 DIAGNOSIS — N189 Chronic kidney disease, unspecified: Secondary | ICD-10-CM

## 2019-12-25 DIAGNOSIS — I4891 Unspecified atrial fibrillation: Secondary | ICD-10-CM | POA: Diagnosis not present

## 2019-12-25 DIAGNOSIS — Z79899 Other long term (current) drug therapy: Secondary | ICD-10-CM | POA: Insufficient documentation

## 2019-12-25 DIAGNOSIS — R778 Other specified abnormalities of plasma proteins: Secondary | ICD-10-CM | POA: Diagnosis not present

## 2019-12-25 DIAGNOSIS — E1122 Type 2 diabetes mellitus with diabetic chronic kidney disease: Secondary | ICD-10-CM | POA: Insufficient documentation

## 2019-12-25 DIAGNOSIS — R0989 Other specified symptoms and signs involving the circulatory and respiratory systems: Secondary | ICD-10-CM | POA: Diagnosis not present

## 2019-12-25 DIAGNOSIS — Z87891 Personal history of nicotine dependence: Secondary | ICD-10-CM | POA: Insufficient documentation

## 2019-12-25 DIAGNOSIS — I130 Hypertensive heart and chronic kidney disease with heart failure and stage 1 through stage 4 chronic kidney disease, or unspecified chronic kidney disease: Secondary | ICD-10-CM | POA: Insufficient documentation

## 2019-12-25 DIAGNOSIS — I4811 Longstanding persistent atrial fibrillation: Secondary | ICD-10-CM | POA: Diagnosis not present

## 2019-12-25 DIAGNOSIS — R55 Syncope and collapse: Secondary | ICD-10-CM | POA: Diagnosis not present

## 2019-12-25 DIAGNOSIS — Z7901 Long term (current) use of anticoagulants: Secondary | ICD-10-CM | POA: Diagnosis not present

## 2019-12-25 DIAGNOSIS — F32A Depression, unspecified: Secondary | ICD-10-CM | POA: Diagnosis not present

## 2019-12-25 DIAGNOSIS — I129 Hypertensive chronic kidney disease with stage 1 through stage 4 chronic kidney disease, or unspecified chronic kidney disease: Secondary | ICD-10-CM | POA: Diagnosis not present

## 2019-12-25 DIAGNOSIS — I509 Heart failure, unspecified: Secondary | ICD-10-CM | POA: Diagnosis not present

## 2019-12-25 DIAGNOSIS — R569 Unspecified convulsions: Secondary | ICD-10-CM | POA: Diagnosis not present

## 2019-12-25 LAB — URINALYSIS, COMPLETE (UACMP) WITH MICROSCOPIC
Bilirubin Urine: NEGATIVE
Glucose, UA: NEGATIVE mg/dL
Ketones, ur: NEGATIVE mg/dL
Leukocytes,Ua: NEGATIVE
Nitrite: NEGATIVE
Protein, ur: 30 mg/dL — AB
Specific Gravity, Urine: 1.019 (ref 1.005–1.030)
pH: 5 (ref 5.0–8.0)

## 2019-12-25 LAB — TROPONIN I (HIGH SENSITIVITY)
Troponin I (High Sensitivity): 29 ng/L — ABNORMAL HIGH (ref <18)
Troponin I (High Sensitivity): 33 ng/L — ABNORMAL HIGH (ref <18)

## 2019-12-25 LAB — BASIC METABOLIC PANEL
Anion gap: 11 (ref 5–15)
BUN: 28 mg/dL — ABNORMAL HIGH (ref 8–23)
CO2: 22 mmol/L (ref 22–32)
Calcium: 9.4 mg/dL (ref 8.9–10.3)
Chloride: 105 mmol/L (ref 98–111)
Creatinine, Ser: 1.61 mg/dL — ABNORMAL HIGH (ref 0.61–1.24)
GFR, Estimated: 46 mL/min — ABNORMAL LOW (ref >60)
Glucose, Bld: 86 mg/dL (ref 70–99)
Potassium: 4.5 mmol/L (ref 3.5–5.1)
Sodium: 138 mmol/L (ref 135–145)

## 2019-12-25 LAB — CBC
HCT: 39.9 % (ref 39.0–52.0)
Hemoglobin: 12.9 g/dL — ABNORMAL LOW (ref 13.0–17.0)
MCH: 32.3 pg (ref 26.0–34.0)
MCHC: 32.3 g/dL (ref 30.0–36.0)
MCV: 100 fL (ref 80.0–100.0)
Platelets: 94 10*3/uL — ABNORMAL LOW (ref 150–400)
RBC: 3.99 MIL/uL — ABNORMAL LOW (ref 4.22–5.81)
RDW: 15.3 % (ref 11.5–15.5)
WBC: 5.7 10*3/uL (ref 4.0–10.5)
nRBC: 0 % (ref 0.0–0.2)

## 2019-12-25 LAB — MAGNESIUM: Magnesium: 2 mg/dL (ref 1.7–2.4)

## 2019-12-25 LAB — BRAIN NATRIURETIC PEPTIDE: B Natriuretic Peptide: 555 pg/mL — ABNORMAL HIGH (ref 0.0–100.0)
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
## 2019-12-25 NOTE — Telephone Encounter (Signed)
Scheduled tomorrow 12-7 walker at 230

## 2019-12-25 NOTE — Telephone Encounter (Signed)
Spoke with patients son per release form. He states that he had some episodes of him having near fainting episodes. Today he was at [facility] and had episode where he was going down and people there helped him to the ground slowly. He then had another episode in the car where his eyes rolled back in his head. He reports that patient has tremors and and those appeared worse. Currently they are waiting in the ED. Reviewed that they should stay for evaluation and confirmed appointment for tomorrow here in our office. Patients son verbalized understanding with no further questions at this time.

## 2019-12-25 NOTE — Telephone Encounter (Signed)
Patient son calling to let provider know patient was taken to hospital ed for weakness and falling .   Son states this happened previously and he is concerned maybe bp or hr too low intermittently.

## 2019-12-25 NOTE — Progress Notes (Signed)
Office Visit    Patient Name: [name] Date of Encounter: 12/26/2019  Primary Care Provider:  Albina Billet, MD Primary Cardiologist:  Ida Rogue, MD Electrophysiologist:  None   Chief Complaint    [name] is a 69 y.o. male with a hx of persistent atrial fibrillation s/p catheter ablation with recurrence, HFrEF, nonischemic cardiomyopathy, cirrhosis, alcohol use, CKD 3, hypothyroidism, DM2, nonobstructive CAD, HTN, HLD, macrocytic anemia, smoldering myeloma, thrombocytopenia, venous insufficiency, lymphedema presents today for ED follow up.    Past Medical History    Past Medical History:  Diagnosis Date  . (HFpEF) heart failure with preserved ejection fraction ([place])    a. 03/2019 cMRI: EF 30-40%, glob HK; b. 05/2019 Echo [facility]): EF 35%.  . CHF (congestive heart failure) ([place])   . CKD (chronic kidney disease), stage III ([place])   . Coumadin resistance ([place])   . Diabetes mellitus without complication ([place])   . Dysrhythmia    A. Fib  . Fatty liver   . H/O ETOH abuse   . Hepatic encephalopathy ([place])    history  . Hx of adenomatous polyp of colon   . Hx of thrombocytopenia   . Hypertension   . Hypothyroidism   . Macrocytic anemia   . Mixed hyperlipidemia   . NICM (nonischemic cardiomyopathy) ([place])    a. 03/2019 cMRI: EF 30-40%, glob HK. No LGE. No evidence of infiltrative cardiomyopathy; b. 05/2019 Echo [facility]): EF 35%, mild to mod MR, sev dil LA, sev dil RV w/ mod reduced RV fxn. Sev TR. Mild-mod PAH (PASP 15mmHg), sev dil RA.  [name] Nonobstructive CAD (coronary artery disease)    a. 02/2019 Cath [facility]): LM nl, LAD 35m, D1 small, D2 60ost, D3 small, LCX nl, OM2 25, RCA 15m, 25d.  [name] Persistent atrial fibrillation ([place])    a. CHA2DS2VASc = 5-->eliquis; b. 05/2019 s/p RFCA/[facility]).  . Valvular heart disease    a. 05/2019 Echo [facility]): mild to mod MR, sev TR.   Past Surgical History:  Procedure Laterality Date  . CARDIAC CATHETERIZATION     no stents  . COLONOSCOPY    .  COLONOSCOPY WITH PROPOFOL N/A 07/26/2015   Procedure: COLONOSCOPY WITH PROPOFOL;  Surgeon: Manya Silvas, MD;  Location: [facility] ENDOSCOPY;  Service: Endoscopy;  Laterality: N/A;  . COLONOSCOPY WITH PROPOFOL N/A 10/03/2019   Procedure: COLONOSCOPY WITH PROPOFOL;  Surgeon: Lesly Rubenstein, MD;  Location: [facility] ENDOSCOPY;  Service: Endoscopy;  Laterality: N/A;  . ESOPHAGOGASTRODUODENOSCOPY (EGD) WITH PROPOFOL N/A 10/03/2019   Procedure: ESOPHAGOGASTRODUODENOSCOPY (EGD) WITH PROPOFOL;  Surgeon: Lesly Rubenstein, MD;  Location: [facility] ENDOSCOPY;  Service: Endoscopy;  Laterality: N/A;  . HAMMER TOE SURGERY     Allergies  Allergies  Allergen Reactions  . Penicillins Hives   History of Present Illness    [name] is a 69 y.o. male with a hx of persistent atrial fibrillation s/p catheter ablation with recurrence, HFrEF, nonischemic cardiomyopathy, cirrhosis, alcohol use, CKD 3, hypothyroidism, DM2, nonobstructive CAD, HTN, HLD, macrocytic anemia, smoldering myeloma, thrombocytopenia, venous insufficiency, lymphedema. He was last seen 11/21/19.   He has undergone extensive work-up for cardiomyopathy at [facility] with diagnostic catheterization 02/2019 with minimal nonobstructive CAD.  Cardiac MRI 03/2019 revealed LVEF 30-40% with global hypokinesis.  No LGE or evidence of infiltrative cardiomyopathy.  He underwent atrial fibrillation ablation at [facility] 05/25/19.  His metoprolol was held due to hypotension but resumed on discharge.  His Entresto was stopped due to hypotension and he was  recommended to resume according to his outpatient physician.  Escalation of heart failure therapies has been limited by relative hypotension.  At clinic visit May 2021 his Delene Loll was added back at night only.  Clinically 07/14/19 his Toprol was increased to 37.5 mg daily due to recurrent atrial flutter after ablation at [facility].  He has decided against proceeding with cardioversion or repeat ablation.  Seen 11/13/19 by Dr. Rockey Situ.  Due to hypotension and orthostasis symptoms his metoprolol was reduced from 37.5mg  twice daily to 12.5mg  twice daily. He declined repeat echocardiogram.  Seen in ED 11/20/19 with dizziness and near syncope. His initial BP was low, case discussed with Dr. Harrington Challenger. CXR showed some vascular congestion and HS-troponin were 23 and 25, he was recommended to hold Entresto and be seen in the office. In follow up Delene Loll was resumed at once daily but due to recurrent hypotension it was stopped.  Seen in ED yesterday due to "shaking" episode and near syncope. ED workup notable for BNP 555, HS-troponin 29 ? 33. Stable renal function 1.61, GFR 46. Hb 12.9. CXR with cardiomegaly and improved pulmonary vascular congestion. EKG with lateral T wave changes. He was recommended to follow up with cardiology.  Seen today in follow up. Wife describes the episode and tells me he was "shaking" his head and his whole body and nearly passing out. Tells me his eyes did not go back in his head like they did during previous episode early November. Thankfully a nurse was present in [place] where the episode occurred and another gentleman stepped in to help him from falling.   He reports noted prodromal syndrome of "head feeling funny" and lightheaded. During the episode he did not bite his tongue and had no incontinence. He has seen Dr. Melrose Nakayama of neurology in the past. His wife plans to contact their office today to schedule an appointment.  EKGs/Labs/Other Studies Reviewed:   The following studies were reviewed today:   05/26/2019 TTE  LV moderately dilated in size with normal wall thickness.  LV systolic function severely decreased, LVEF visually estimated at 35%.  Mitral valve leaflets are mildly thickened with normal leaflet mobility  There is mild to moderate mitral valve regurgitation.  The left atrium is severely dilated in size.  The right ventricle is severely dilated in size, with moderately reduced systolic  function.  There is severe tricuspid regurgitation.  There is mild-moderate pulmonary hypertension, estimated pulmonary artery systolic pressure is 41 mmHg.  The right atrium is severely dilated in size.  IVC size and inspiratory change suggest elevated right atrial pressure. (10-20 mmHg).   02/11/2019 TTE 1. The left ventricle is mildly dilated in size with normal wall thickness. 2. LV systolic function severely decreased, LVEF estimated 25-30% 3. Mitral valve leaflets are mildly thickened with normal leaflet mobility. 4. There is moderate mitral valve regurgitation. 5. Aortic valve is trileaflet with mildly thickened leaflets, normal excursion 6. There is mild aortic regurgitation. 7. The left atrium is moderately dilated in size. 8. RV is severely dilated in size, with reduced systolic function. 9. There is severe tricuspid regurgitation. 10. There is severe pulmonary hypertension, estimated pulmonary artery systolic pressure is 77 mmHg. 11. The right atrium is severely dilated in size. 12. IVC size and inspiratory change suggest elevated right atrial pressure. (10-20 mmHg).  **  Cardiac CT/MRI/Nuclear Tests:  02/14/2019 PET Stress - Abnormal myocardial perfusion study - There is a small in size, mild in severity, nearly completely reversible defect involving the apical and apical  anterior segments. This is consistent with scar with peri-infarct ischemia. - There is a small in size, mild in severity, partially reversible defect involving the basal inferolateral and basal inferolateral segments. This is consistent with scar and liminal peri-infarct ischemia. Cannot rule out artifact. - During stress: Global systolic function is severely reduced. The ejection fraction calculated at 26%. The left ventricle is dilated. Right ventricular chamber size is severely dilated. Right ventricular systolic function is reduced. - 3 vessel coronary calcifications are noted - Small bilateral  pleural effusions and large volume ascites are noted - Incidentally noted is a 3 mm nodule in the right upper lobe (series 6, image 19). Comparison with outside films is recommended if available. If not available, then by Fleischner criteria consider a dedicated chest CT in 12 months if cancer risk factors ARE present.   EKG:  EKG ordered today. The EKG performed today demonstrates rate controlled atrial fib with known RBBB, noted later T- wave abnormality which is improved compared to ED yesterday and overall nonspecific.  Recent Labs: 04/05/2019: TSH 2.342 11/20/2019: ALT 13 12/25/2019: B Natriuretic Peptide 555.0; BUN 28; Creatinine, Ser 1.61; Hemoglobin 12.9; Magnesium 2.0; Platelets 94; Potassium 4.5; Sodium 138  Recent Lipid Panel No results found for: CHOL, TRIG, HDL, CHOLHDL, VLDL, LDLCALC, LDLDIRECT  Home Medications   Current Meds  Medication Sig  . apixaban (ELIQUIS) 5 MG TABS tablet Take by mouth 2 (two) times daily.   [name] atorvastatin (LIPITOR) 10 MG tablet Take 10 mg by mouth daily.  . clobetasol cream (TEMOVATE) 9.48 % Apply 1 application topically 2 (two) times daily.  . colchicine 0.6 MG tablet Take 0.6 mg by mouth 2 (two) times daily.   [name] gabapentin (NEURONTIN) 300 MG capsule Take 300 mg by mouth 3 (three) times daily.   [name] ketoconazole (NIZORAL) 2 % cream Apply 1 application topically at bedtime.  . lactobacillus acidophilus (BACID) TABS tablet Take 2 tablets by mouth 3 (three) times daily.  [name] levothyroxine (SYNTHROID, LEVOTHROID) 100 MCG tablet Take 100 mcg by mouth every morning.  . metoprolol succinate (TOPROL-XL) 25 MG 24 hr tablet Take 0.5 tablets (12.5 mg total) by mouth in the morning and at bedtime.  . mupirocin ointment (BACTROBAN) 2 % Apply 1 application topically daily. To any open sores or crusted areas of legs  . pantoprazole (PROTONIX) 20 MG tablet Take 40 mg by mouth daily.   . potassium chloride SA (KLOR-CON) 20 MEQ tablet Take 1 tablet (20 mEq total) by mouth  as directed. Take 1 tablet (20 mEq) once daily. Hold on Monday/Wednesdy/Friday    Review of Systems  All other systems reviewed and are otherwise negative except as noted above.  Physical Exam    VS:  BP 106/66 (BP Location: Right Arm, Patient Position: Sitting, Cuff Size: Normal)   Pulse 75   Ht 6\' 2"  (1.88 m)   Wt 207 lb (93.9 kg)   SpO2 96%   BMI 26.58 kg/m  , BMI Body mass index is 26.58 kg/m.  Wt Readings from Last 3 Encounters:  12/26/19 207 lb (93.9 kg)  12/25/19 207 lb 7.3 oz (94.1 kg)  12/01/19 207 lb 8 oz (94.1 kg)   GEN: Well nourished, well developed, in no acute distress. HEENT: normal. Neck: Supple, no JVD, carotid bruits, or masses. Cardiac: RRR, no murmurs, rubs, or gallops. No clubbing, cyanosis, edema.  Radials/DP/PT 2+ and equal bilaterally.  Respiratory:  Respirations regular and unlabored, clear to auscultation bilaterally. GI: Soft, nontender, nondistended. MS: No deformity  or atrophy. Skin: Warm and dry, no rash. Neuro:  Strength and sensation are intact. Psych: Normal affect.  Assessment & Plan    1. Near syncope - Recurrent episodes of near syncope. Plan for carotid duplex as he has never had performed. Known persistent atrial fib not interested in cardioversion, 14 day ZIO monitor placed today to rule out significant pause, heart block, symptomatic bradycardia. Previous echo 05/2019 at [facility] detailed above with LVEF 35%, mild-mod MR, RV severely dilated with moderately reduced systolic function, severe TR, mild-moderate pulmonary hypertension, elevated RA pressure 10-46mmHg. Will not repeat echo at this time. Episodes also associated with shaking, encouraged to schedule appt with Dr. Melrose Nakayama of neurology.   2. Elevated troponin - Mildly elevated HS-troponin dating back to 04/04/2019. EKG today rate controlled atrial fibrillation with nonspecific t-wave abnormality in the lateral leads. Elevated troponin likely due to HFrEF and CKD. No indication for ischemic  evaluation at this time.  He had cardiac cath 02/2019 with minimal nonobstructive CAD at [facility].  3. Persistent atrial fibrillation/flutter with variable block -s/p catheter ablation at [facility] 05/2019.  Recurrent atrial flutter noted 07/14/2019. Asymptomatic with no palpitations. He has declined further cardioversion or ablation. Continue anticoagulation with Eliquis 5mg  BID.   4. HFrEF/nonischemic cardiomyopathy -  Euvolemic and well compensated on exam. Unable to further up-titrate HF medications due to hypotension and recurrent lightheadedness. He has previously been unable to tolerate Entresto. We will continue Torsemide 20mg  daily and Toprol 12.5mg  daily.   5. HTN / Hypotension- BP well controlled. Continue current antihypertensive regimen.   6. HLD - Continue Atorvastatin 10mg  daily.  7. CKD 3a -  Careful titration of diuretics and antihypertensives. Stable on lab work in ED.  8. Hepatic cirrhosis- Continue to follow with GI.  9. Smoldering myeloma / Thrombocytopenia - Continue to follow with D. Finnegan of oncology.   10. Venous insufficiency / Lymphedema - Continue to follow with vascular. Elevate lower extremities when sitting. No significant edema noted on exam.  Disposition: Follow up in 6 week(s) with Dr. Rockey Situ   Signed, Loel Dubonnet, NP 12/26/2019, 2:36 PM [place]

## 2019-12-25 NOTE — ED Notes (Signed)
DC reviewed with pt by provider. Denies questions or concerns, wife wheeling pt out via wheelchair, AO x4

## 2019-12-25 NOTE — ED Triage Notes (Signed)
Pt comes into the ED via EMS from [place], was there shopping , had sudden onset generalized weakness  With near syncope , dizziness, that resolved on EMS arrival, CBG 101, VSS, a-fib with normal rate and hx of it in the past. A/ox4

## 2019-12-25 NOTE — ED Provider Notes (Signed)
[facility] Emergency Department Provider Note  ____________________________________________   First MD Initiated Contact with Patient 12/25/19 2010     (approximate)  I have reviewed the triage vital signs and the nursing notes.   HISTORY  Chief Complaint Near Syncope   HPI [name] is a 69 y.o. male with a past medical history of A. fib rate controlled on metoprolol and anticoagulated with Eliquis, CHF, CKD, DM, HTN, hypothyroidism, CAD, and cirrhosis secondary to remote EtOH abuse complicated by episodes of intermittent hepatic encephalopathy on daily lactulose who presents accompanied by his girlfriend for an episode of dizziness and shaking that occurred earlier today while they were at [facility]. Patient states he was lowered to the ground but did not pass out or hit his head. He thinks the whole episode lasted maybe 10 to 15 minutes. He did not have any chest pain, cough, shortness of breath, back pain, nausea, vomiting, diarrhea, dysuria, or focal weakness numbness or tingling to his episode. He states he had a similar episode about a month ago that was also associate with some shaking but no other episodes like this. He denies EtOH, illicit drug use, or tobacco abuse. States he is compliant with medications. States he currently is asymptomatic and does not feel dizzy or lightheaded at all. He denies any incontinence or tongue biting. No other recent falls or injuries.         Past Medical History:  Diagnosis Date  . (HFpEF) heart failure with preserved ejection fraction ([place])    a. 03/2019 cMRI: EF 30-40%, glob HK; b. 05/2019 [facility]): EF 35%.  . CHF (congestive heart failure) ([place])   . CKD (chronic kidney disease), stage III ([place])   . Coumadin resistance ([place])   . Diabetes mellitus without complication ([place])   . Dysrhythmia    A. Fib  . Fatty liver   . H/O ETOH abuse   . Hepatic encephalopathy ([place])    history  . Hx of adenomatous polyp of  colon   . Hx of thrombocytopenia   . Hypertension   . Hypothyroidism   . Macrocytic anemia   . Mixed hyperlipidemia   . NICM (nonischemic cardiomyopathy) ([place])    a. 03/2019 cMRI: EF 30-40%, glob HK. No LGE. No evidence of infiltrative cardiomyopathy; b. 05/2019 Echo [facility]): EF 35%, mild to mod MR, sev dil LA, sev dil RV w/ mod reduced RV fxn. Sev TR. Mild-mod PAH (PASP 24mmHg), sev dil RA.  [name] Nonobstructive CAD (coronary artery disease)    a. 02/2019 Cath [facility]): LM nl, LAD 66m, D1 small, D2 60ost, D3 small, LCX nl, OM2 25, RCA 32m, 25d.  [name] Persistent atrial fibrillation ([place])    a. CHA2DS2VASc = 5-->eliquis; b. 05/2019 s/p RFCA/PVI [facility]).  . Valvular heart disease    a. 05/2019 Echo [facility]): mild to mod MR, sev TR.    Patient Active Problem List   Diagnosis Date Noted  . Chronic venous insufficiency 10/26/2019  . Lymphedema 10/26/2019  . Hyperlipidemia 10/26/2019  . PAD (peripheral artery disease) ([place]) 10/26/2019  . Hypotension due to drugs 05/26/2019  . Cellulitis 04/24/2019  . Hypothyroidism 04/24/2019  . Acute renal failure superimposed on chronic kidney disease ([place]) 04/24/2019  . Hepatic encephalopathy ([employer]) 04/05/2019  . Benign essential tremor 03/28/2019  . Dizziness 03/28/2019  . Essential hypertension 03/03/2019  . Alcoholic steatohepatitis 03/47/4259  . Adenomatous polyp of colon 02/11/2019  . AMS (altered mental status) 02/11/2019  . Elevated serum creatinine 02/11/2019  .  Esophageal dysphagia 02/11/2019  . 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 Tremor 02/11/2019  . Volume overload 02/11/2019  . MGUS (monoclonal gammopathy of unknown significance) 02/11/2019  . Pedal edema 02/07/2019  . Pain due to onychomycosis of toenails of both feet 07/25/2018  . Nonrheumatic mitral valve regurgitation 04/26/2018  . Nonrheumatic tricuspid valve regurgitation 04/26/2018  . Cardiomyopathy, nonischemic ([place]) 11/15/2015  . History of  adenomatous polyp of colon 06/03/2015  . History of thrombocytopenia 06/03/2015  . Persistent atrial fibrillation ([place]) 04/28/2013  . HFrEF (heart failure with reduced ejection fraction) ([place]) 04/28/2013  . Coumadin resistance ([place]) 04/28/2013  . Left ventricular dysfunction 04/28/2013    Past Surgical History:  Procedure Laterality Date  . CARDIAC CATHETERIZATION     no stents  . COLONOSCOPY    . COLONOSCOPY WITH PROPOFOL N/A 07/26/2015   Procedure: COLONOSCOPY WITH PROPOFOL;  Surgeon: Manya Silvas, MD;  Location[facility] ENDOSCOPY;  Service: Endoscopy;  Laterality: N/A;  . COLONOSCOPY WITH PROPOFOL N/A 10/03/2019   Procedure: COLONOSCOPY WITH PROPOFOL;  Surgeon: Lesly Rubenstein, MD;  Location: [facility] ENDOSCOPY;  Service: Endoscopy;  Laterality: N/A;  . ESOPHAGOGASTRODUODENOSCOPY (EGD) WITH PROPOFOL N/A 10/03/2019   Procedure: ESOPHAGOGASTRODUODENOSCOPY (EGD) WITH PROPOFOL;  Surgeon: Lesly Rubenstein, MD;  Location: [facility] ENDOSCOPY;  Service: Endoscopy;  Laterality: N/A;  . HAMMER TOE SURGERY      Prior to Admission medications   Medication Sig Start Date End Date Taking? Authorizing Provider  apixaban (ELIQUIS) 5 MG TABS tablet Take by mouth 2 (two) times daily.  02/18/19   [provider]  atorvastatin (LIPITOR) 10 MG tablet Take 10 mg by mouth daily.    [provider]  clobetasol cream (TEMOVATE) 2.99 % Apply 1 application topically 2 (two) times daily.    [provider]  colchicine 0.6 MG tablet Take 0.6 mg by mouth 2 (two) times daily.     [provider]  gabapentin (NEURONTIN) 300 MG capsule Take 300 mg by mouth 3 (three) times daily.  06/20/19   [provider]  ketoconazole (NIZORAL) 2 % cream Apply 1 application topically at bedtime. 10/16/19 06/12/20  Ralene Bathe, MD  lactobacillus acidophilus (BACID) TABS tablet Take 2 tablets by mouth 3 (three) times daily. 04/07/19   Nolberto Hanlon, MD  lactulose (CHRONULAC) 10 GM/15ML  solution  05/03/19   [provider]  levothyroxine (SYNTHROID, LEVOTHROID) 100 MCG tablet Take 100 mcg by mouth every morning. 10/29/17   [provider]  metoprolol succinate (TOPROL-XL) 25 MG 24 hr tablet Take 0.5 tablets (12.5 mg total) by mouth in the morning and at bedtime. 11/13/19   Minna Merritts, MD  mupirocin ointment (BACTROBAN) 2 % Apply 1 application topically daily. To any open sores or crusted areas of legs 10/16/19   Ralene Bathe, MD  pantoprazole (PROTONIX) 20 MG tablet Take 40 mg by mouth daily.  02/02/19   [provider]  potassium chloride SA (KLOR-CON) 20 MEQ tablet Take 1 tablet (20 mEq total) by mouth as directed. Take 1 tablet (20 mEq) once daily. Hold on Monday/Wednesdy/Friday 05/24/19   Minna Merritts, MD  torsemide (DEMADEX) 20 MG tablet Take 1 tablet (20 mg total) by mouth as directed. Hold on Monday/Wednesdy/Friday 05/24/19 12/01/19  Minna Merritts, MD    Allergies Penicillins  Family History  Problem Relation Age of Onset  . Heart Problems Mother   . Heart Problems Father   . Heart Problems Sister  Social History Social History   Tobacco Use  . Smoking status: Former Research scientist (life sciences)  . Smokeless tobacco: Current User    Types: Snuff  Vaping Use  . Vaping Use: Never used  Substance Use Topics  . Alcohol use: No  . Drug use: No    Review of Systems  Review of Systems  Constitutional: Negative for chills and fever.  HENT: Negative for sore throat.   Eyes: Negative for pain.  Respiratory: Negative for cough and stridor.   Cardiovascular: Negative for chest pain.  Gastrointestinal: Negative for vomiting.  Genitourinary: Negative for dysuria.  Musculoskeletal: Negative for myalgias.  Skin: Negative for rash.  Neurological: Positive for dizziness and tremors. Negative for loss of consciousness and headaches.  Psychiatric/Behavioral: Negative for suicidal ideas.  All other systems reviewed and are negative.      ____________________________________________   PHYSICAL EXAM:  VITAL SIGNS: ED Triage Vitals  Enc Vitals Group     BP 12/25/19 1557 124/64     Pulse Rate 12/25/19 1557 (!) 45     Resp 12/25/19 1557 16     Temp 12/25/19 1557 98.5 F (36.9 C)     Temp src --      SpO2 12/25/19 1557 100 %     Weight 12/25/19 1600 207 lb 7.3 oz (94.1 kg)     Height 12/25/19 1600 6\' 2"  (1.88 m)     Head Circumference --      Peak Flow --      Pain Score 12/25/19 1559 0     Pain Loc --      Pain Edu? --      Excl. in [place]? --    Vitals:   12/25/19 2101 12/25/19 2200  BP: (!) 135/92 128/90  Pulse: 87 89  Resp: 16 19  Temp:    SpO2: 100% 98%   Physical Exam Vitals and nursing note reviewed.  Constitutional:      Appearance: He is well-developed.  HENT:     Head: Normocephalic and atraumatic.     Right Ear: External ear normal.     Left Ear: External ear normal.     Nose: Nose normal.     Mouth/Throat:     Mouth: Mucous membranes are dry.  Eyes:     Conjunctiva/sclera: Conjunctivae normal.  Cardiovascular:     Rate and Rhythm: Normal rate. Rhythm irregular.     Heart sounds: No murmur heard.   Pulmonary:     Effort: Pulmonary effort is normal. No respiratory distress.     Breath sounds: Normal breath sounds.  Abdominal:     Palpations: Abdomen is soft.     Tenderness: There is no abdominal tenderness.  Musculoskeletal:     Cervical back: Neck supple.  Skin:    General: Skin is warm and dry.     Capillary Refill: Capillary refill takes less than 2 seconds.  Neurological:     Mental Status: He is alert.     Motor: Tremor present.     Cranial nerves II through XII grossly intact.  No pronator drift.  No finger dysmetria.  Symmetric 5/5 strength of all extremities.  Sensation intact to light touch in all extremities.  Unremarkable unassisted gait. ____________________________________________   LABS (all labs ordered are listed, but only abnormal results are  displayed)  Labs Reviewed  BASIC METABOLIC PANEL - Abnormal; Notable for the following components:      Result Value   BUN 28 (*)    Creatinine, Ser 1.61 (*)  GFR, Estimated 46 (*)    All other components within normal limits  CBC - Abnormal; Notable for the following components:   RBC 3.99 (*)    Hemoglobin 12.9 (*)    Platelets 94 (*)    All other components within normal limits  URINALYSIS, COMPLETE (UACMP) WITH MICROSCOPIC - Abnormal; Notable for the following components:   Color, Urine YELLOW (*)    APPearance CLEAR (*)    Hgb urine dipstick SMALL (*)    Protein, ur 30 (*)    Bacteria, UA RARE (*)    All other components within normal limits  BRAIN NATRIURETIC PEPTIDE - Abnormal; Notable for the following components:   B Natriuretic Peptide 555.0 (*)    All other components within normal limits  TROPONIN I (HIGH SENSITIVITY) - Abnormal; Notable for the following components:   Troponin I (High Sensitivity) 29 (*)    All other components within normal limits  TROPONIN I (HIGH SENSITIVITY) - Abnormal; Notable for the following components:   Troponin I (High Sensitivity) 33 (*)    All other components within normal limits  MAGNESIUM  CBG MONITORING, ED   ____________________________________________  EKG  A. fib with a rate of 75, normal axis, unremarkable intervals, right bundle branch block, nonspecific ST changes in the inferior and lateral and anterior leads with a PVC. Patient is noted to have similar T wave changes in the anterior lateral and inferior leads on EKG obtained on 11/21 there is slightly more pronounced today. No other acute changes today and patient is known to have an right bundle branch block on his prior EKG. ____________________________________________  RADIOLOGY  ED MD interpretation: Cardiomegaly without significant pulmonary edema, no large effusion, no focal consolidation, no significant widening of mediastinum or other acute intrathoracic  process.  Official radiology report(s): DG Chest 2 View  Result Date: 12/25/2019 CLINICAL DATA:  Syncopal episodes.  Dizziness and weakness. EXAM: CHEST - 2 VIEW COMPARISON:  Radiographs 11/20/2019 and 04/04/2019. FINDINGS: Stable cardiomegaly and aortic atherosclerosis. Pulmonary vascular congestion has improved, and there is no definite residual/recurrent edema or pleural effusion. There is no confluent airspace opacity or pneumothorax. Mild degenerative changes are present within the spine. IMPRESSION: Cardiomegaly with improved pulmonary vascular congestion. No definite edema or pleural effusion. Electronically Signed   By: Richardean Sale M.D.   On: 12/25/2019 20:32    ____________________________________________   PROCEDURES  Procedure(s) performed (including Critical Care):  .1-3 Lead EKG Interpretation Performed by: Lucrezia Starch, MD Authorized by: Lucrezia Starch, MD     Interpretation: abnormal     ECG rate assessment: normal     Rhythm: atrial fibrillation       ____________________________________________   INITIAL IMPRESSION / ASSESSMENT AND PLAN / ED COURSE      Patient presents after an episode of dizziness associate with some shaking that occurred earlier today as described above.  On arrival patient is afebrile hemodynamically stable with a nonfocal neuro exam.  His lungs are clear bilaterally and he is an irregular heart rate.  Differential includes but is not limited to seizure versus presyncopal episode.  Low suspicion for seizure at this time as patient has never had a seizure diagnosis before and it seems she was not postictal after this event and no tongue biting or urine incontinence.  In addition he has several risk factors for cardiac etiology for contributing to his lightheadedness.  He also states he has not been eating or drinking much today is a very dry mucous  membranes on exam I think is slightly dehydrated  Differential for cardiac syncope  includes ACS, arrhythmia, acute anemia, metabolic derangements, heart failure, and no cardiomyopathy.  No findings on history exam to suggest CVA.  Very low suspicion for acute infectious process  ECG show A. fib with appropriately controlled rate and some nonspecific changes that are present on prior EKGs.  No evidence of a RVR.  Episode described was limited to a few minutes and there are no significant symptoms afterwards suggestive of acute aortic stenosis or acute mitral regurg.  In addition there are no findings on chest x-ray to suggest acute heart failure or volume overload in the lungs.  BNP is slightly elevated although a low suspicion for overall volume overload as etiology for his lightheadedness dizziness.  BMP shows evidence of CKD with creatinine of 1.6 which is at baseline compared to 1.694 months ago.  No other significant electrolyte or metabolic derangements.  CBC is unremarkable for evidence of acute anemia or other significant derangements.  UA shows 30 protein otherwise is not suggestive of an infection.  Magnesium is unremarkable.  All troponins obtained x2 are slightly elevated seems patient is chronically elevated troponins being in the mid 20s and at 47 and 48 8 months ago.  Suspect this may be related to chronic elevation secondary to CKD and given stability over 2 hours with patient denying any chest pain and otherwise relatively unchanged EKG when compared to prior low suspicion for ACS at this time.  Given no recurrence of symptoms after approximately 9 hours emergency room with otherwise stable vital signs otherwise reassuring exam work-up with plan for cardiology appointment tomorrow morning at less than 12 hours believe discharge is reasonable with plan for close outpatient follow-up. Discharge stable condition. Strict impressions advised and discussed        ____________________________________________   FINAL CLINICAL IMPRESSION(S) / ED DIAGNOSES  Final diagnoses:   Near syncope  Troponin I above reference range  Longstanding persistent atrial fibrillation (HCC)  Anticoagulated  Chronic kidney disease, unspecified CKD stage    Medications - No data to display   ED Discharge Orders    None       Note:  This document was prepared using Dragon voice recognition software and may include unintentional dictation errors.   Lucrezia Starch, MD 12/25/19 2250

## 2019-12-25 NOTE — ED Triage Notes (Signed)
Pt here with wife for near syncopal epidose. Pt got dizzy and weak and started to shake. Wife thinks that he may have had a seizure. Pt NAD in triage.

## 2019-12-25 NOTE — ED Notes (Signed)
Wife remains at bedside, pt AO x4, denies complaints, awaiting repeat trop results

## 2019-12-26 ENCOUNTER — Ambulatory Visit (INDEPENDENT_AMBULATORY_CARE_PROVIDER_SITE_OTHER): Payer: PPO

## 2019-12-26 ENCOUNTER — Other Ambulatory Visit: Payer: Self-pay

## 2019-12-26 ENCOUNTER — Ambulatory Visit: Payer: PPO | Admitting: Family

## 2019-12-26 ENCOUNTER — Encounter: Payer: Self-pay | Admitting: Family

## 2019-12-26 VITALS — BP 106/66 | HR 75 | Ht 74.0 in | Wt 207.0 lb

## 2019-12-26 DIAGNOSIS — I4819 Other persistent atrial fibrillation: Secondary | ICD-10-CM | POA: Diagnosis not present

## 2019-12-26 DIAGNOSIS — I428 Other cardiomyopathies: Secondary | ICD-10-CM

## 2019-12-26 DIAGNOSIS — R55 Syncope and collapse: Secondary | ICD-10-CM | POA: Diagnosis not present

## 2019-12-26 DIAGNOSIS — Z7901 Long term (current) use of anticoagulants: Secondary | ICD-10-CM

## 2019-12-26 DIAGNOSIS — I5020 Unspecified systolic (congestive) heart failure: Secondary | ICD-10-CM

## 2019-12-26 DIAGNOSIS — I493 Ventricular premature depolarization: Secondary | ICD-10-CM

## 2019-12-26 NOTE — Patient Instructions (Addendum)
Medication Instructions:  No medication changes today.   *If you need a refill on your cardiac medications before your next appointment, please call your pharmacy*  Lab Work: No lab work today.  Testing/Procedures:  Your physician has requested that you have a carotid duplex. This test is an ultrasound of the carotid arteries in your neck. It looks at blood flow through these arteries that supply the brain with blood. Allow one hour for this exam. There are no restrictions or special instructions.  Your physician has recommended that you wear a Zio monitor. This monitor is a medical device that records the heart's electrical activity. The monitor is a small device applied to your chest. You can wear one while you do your normal daily activities. While wearing this monitor if you have any symptoms to push the button and record what you felt. Once you have worn this monitor for the period of time provider prescribed (14 days), you will return the monitor device in the postage paid box. Once it is returned they will download the data collected and provide [place] with a report which the provider will then review and we will call you with those results. Important tips:  1. Avoid showering during the first 24 hours of wearing the monitor. 2. Avoid excessive sweating to help maximize wear time. 3. Do not submerge the device, no hot tubs, and no swimming pools. 4. Keep any lotions or oils away from the patch. 5. After 24 hours you may shower with the patch on. Take brief showers with your back facing the shower head.  6. Do not remove patch once it has been placed because that will interrupt data and decrease adhesive wear time. 7. Push the button when you have any symptoms and write down what you were feeling. 8. Once you have completed wearing your monitor, remove and place into box which has postage paid and place in your outgoing mailbox.  9. If for some reason you have misplaced your box then call our  office and we can provide another box and/or mail it off for you.     Follow-Up: At [facility], you and your health needs are our priority.  As part of our continuing mission to provide you with exceptional heart care, we have created designated Provider Care Teams.  These Care Teams include your primary Cardiologist (physician) and Advanced Practice Providers (APPs -  Physician Assistants and Nurse Practitioners) who all work together to provide you with the care you need, when you need it.   Your next appointment:   6 week(s)  The format for your next appointment:   In Person  Provider:    You may see Ida Rogue, MD   Other Instructions  Discuss the shaking episodes with Dr. Melrose Nakayama when you see him next.

## 2019-12-27 DIAGNOSIS — G250 Essential tremor: Secondary | ICD-10-CM | POA: Diagnosis not present

## 2019-12-27 DIAGNOSIS — R251 Tremor, unspecified: Secondary | ICD-10-CM | POA: Diagnosis not present

## 2019-12-27 DIAGNOSIS — R42 Dizziness and giddiness: Secondary | ICD-10-CM | POA: Diagnosis not present

## 2019-12-29 ENCOUNTER — Other Ambulatory Visit: Payer: Self-pay | Admitting: Neurology

## 2019-12-29 DIAGNOSIS — I6523 Occlusion and stenosis of bilateral carotid arteries: Secondary | ICD-10-CM

## 2019-12-29 DIAGNOSIS — I639 Cerebral infarction, unspecified: Secondary | ICD-10-CM

## 2019-12-31 DIAGNOSIS — R569 Unspecified convulsions: Secondary | ICD-10-CM | POA: Diagnosis not present

## 2020-01-01 DIAGNOSIS — I480 Paroxysmal atrial fibrillation: Secondary | ICD-10-CM | POA: Diagnosis not present

## 2020-01-01 DIAGNOSIS — D696 Thrombocytopenia, unspecified: Secondary | ICD-10-CM | POA: Diagnosis not present

## 2020-01-01 DIAGNOSIS — I864 Gastric varices: Secondary | ICD-10-CM | POA: Diagnosis not present

## 2020-01-01 DIAGNOSIS — K7030 Alcoholic cirrhosis of liver without ascites: Secondary | ICD-10-CM | POA: Diagnosis not present

## 2020-01-01 DIAGNOSIS — C9000 Multiple myeloma not having achieved remission: Secondary | ICD-10-CM | POA: Diagnosis not present

## 2020-01-01 DIAGNOSIS — I5022 Chronic systolic (congestive) heart failure: Secondary | ICD-10-CM | POA: Diagnosis not present

## 2020-01-02 ENCOUNTER — Other Ambulatory Visit: Payer: Self-pay | Admitting: Gastroenterology

## 2020-01-02 DIAGNOSIS — K7030 Alcoholic cirrhosis of liver without ascites: Secondary | ICD-10-CM

## 2020-01-02 DIAGNOSIS — D696 Thrombocytopenia, unspecified: Secondary | ICD-10-CM

## 2020-01-02 DIAGNOSIS — I864 Gastric varices: Secondary | ICD-10-CM

## 2020-01-05 NOTE — Addendum Note (Signed)
Addended by: Janan Ridge on: 01/05/2020 02:37 PM   Modules accepted: Orders

## 2020-01-08 ENCOUNTER — Ambulatory Visit: Payer: PPO

## 2020-01-08 ENCOUNTER — Other Ambulatory Visit: Payer: PPO

## 2020-01-08 DIAGNOSIS — R569 Unspecified convulsions: Secondary | ICD-10-CM | POA: Insufficient documentation

## 2020-01-09 DIAGNOSIS — R569 Unspecified convulsions: Secondary | ICD-10-CM | POA: Diagnosis not present

## 2020-01-10 ENCOUNTER — Ambulatory Visit
Admission: RE | Admit: 2020-01-10 | Discharge: 2020-01-10 | Disposition: A | Discharge status: Home / Self Care | Payer: PPO | Source: Ambulatory Visit | Attending: Neurology | Admitting: Neurology

## 2020-01-10 ENCOUNTER — Other Ambulatory Visit: Payer: Self-pay

## 2020-01-10 DIAGNOSIS — I639 Cerebral infarction, unspecified: Secondary | ICD-10-CM

## 2020-01-10 DIAGNOSIS — I619 Nontraumatic intracerebral hemorrhage, unspecified: Secondary | ICD-10-CM | POA: Diagnosis not present

## 2020-01-10 DIAGNOSIS — I6782 Cerebral ischemia: Secondary | ICD-10-CM | POA: Diagnosis not present

## 2020-01-10 DIAGNOSIS — I6523 Occlusion and stenosis of bilateral carotid arteries: Secondary | ICD-10-CM | POA: Diagnosis not present

## 2020-01-10 DIAGNOSIS — Z0389 Encounter for observation for other suspected diseases and conditions ruled out: Secondary | ICD-10-CM | POA: Diagnosis not present

## 2020-01-10 DIAGNOSIS — E785 Hyperlipidemia, unspecified: Secondary | ICD-10-CM | POA: Diagnosis not present

## 2020-01-16 ENCOUNTER — Ambulatory Visit
Admission: RE | Admit: 2020-01-16 | Discharge: 2020-01-16 | Disposition: A | Discharge status: Home / Self Care | Payer: PPO | Source: Ambulatory Visit | Attending: Gastroenterology | Admitting: Gastroenterology

## 2020-01-16 ENCOUNTER — Other Ambulatory Visit: Payer: Self-pay

## 2020-01-16 DIAGNOSIS — D696 Thrombocytopenia, unspecified: Secondary | ICD-10-CM | POA: Diagnosis not present

## 2020-01-16 DIAGNOSIS — I864 Gastric varices: Secondary | ICD-10-CM | POA: Diagnosis not present

## 2020-01-16 DIAGNOSIS — K7030 Alcoholic cirrhosis of liver without ascites: Secondary | ICD-10-CM | POA: Diagnosis not present

## 2020-01-19 DIAGNOSIS — R55 Syncope and collapse: Secondary | ICD-10-CM | POA: Diagnosis not present

## 2020-01-23 DIAGNOSIS — E119 Type 2 diabetes mellitus without complications: Secondary | ICD-10-CM | POA: Diagnosis not present

## 2020-01-23 DIAGNOSIS — K7030 Alcoholic cirrhosis of liver without ascites: Secondary | ICD-10-CM | POA: Diagnosis not present

## 2020-01-23 DIAGNOSIS — M109 Gout, unspecified: Secondary | ICD-10-CM | POA: Diagnosis not present

## 2020-01-23 DIAGNOSIS — E785 Hyperlipidemia, unspecified: Secondary | ICD-10-CM | POA: Diagnosis not present

## 2020-01-29 DIAGNOSIS — E038 Other specified hypothyroidism: Secondary | ICD-10-CM | POA: Diagnosis not present

## 2020-01-29 DIAGNOSIS — E119 Type 2 diabetes mellitus without complications: Secondary | ICD-10-CM | POA: Diagnosis not present

## 2020-01-29 DIAGNOSIS — E785 Hyperlipidemia, unspecified: Secondary | ICD-10-CM | POA: Diagnosis not present

## 2020-01-29 DIAGNOSIS — M1000 Idiopathic gout, unspecified site: Secondary | ICD-10-CM | POA: Diagnosis not present

## 2020-02-08 NOTE — Progress Notes (Signed)
Cardiology Office Note  Date:  02/09/2020   ID:  [name], [name] 1950-03-30, MRN [id number]  PCP:  Albina Billet, MD   Chief Complaint  Patient presents with   Follow-up    F/U after testing    HPI:  [name] is a 70 year old gentleman with past medical history of Nonischemic cardiomyopathy Chronic systolic CHF CKD Cirrhosis, NASH, etoh Atrial fibrillation, s/p DCCV 02/16/19 afib ablation at [facility] with TEE 05/2019 LHC 02/15/19 demonstrated 60% ostial D2 stenosis.  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  workup notable for BNP 555, HS-troponin 29 ? 33. Stable renal function 1.61, GFR 46. Hb 12.9. CXR with cardiomegaly and improved pulmonary vascular congestion  Seen by neurology EEG pending  zio monitor, reviewed with him in detail Atrial Fibrillation occurred continuously (100% burden), ranging from 35-187 bpm (avg of 66 bpm).  5 Ventricular Tachycardia runs occurred, the run with the fastest interval lasting 4 beats with a max rate of 156 bpm, the longest lasting 15 beats with an avg rate of 124 bpm.   Difficulty discerning atrial activity making definitive diagnosis difficult to ascertain.  Idioventricular Rhythm was present.  Isolated VEs were occasional (3.6%, [id number]), VE Couplets were rare (<1.0%, 15), and no VE Triplets were present. Ventricular Bigeminy and Trigeminy were present.  Lab work reviewed CR 1.6.  Taking torsemide 4 times a week  EKG personally reviewed by myself on todays visit Shows atrial  fibrillation ventricular rate 66 bpm no significant ST-T wave changes  Other past medical history reviewed In the hospital 04/04/19, with fever 101  left lower extremity  cellulitis. ,treated with keflex elevated ammonia of 82, likely hepatic encephalopathy.     History of EtOH, NASH At d/c , diuretics were held, Ranger held (presumably for elevated creatinine)   in our office 04/10/19, labs done, creatinine improved Was not restarted on diuretic  Developed massive fluid retention, abdominal distention, leg edema, up 20 pounds Torsemide restarted 40 twice daily 5 days then down to 20 twice daily Entresto restarted   Cardiac MRI  no infiltrative disease, ejection fraction up to 30 to 40%  05/02/2019: seen by Dr. Caryl Comes EP: Recommendation to proceed with A. fib ablation   PMH:   has a past medical history of (HFpEF) heart failure with preserved ejection fraction ([place]), CHF (congestive heart failure) ([place]), CKD (chronic kidney disease), stage III ([place]), Coumadin resistance ([place]), Diabetes mellitus without complication ([place]), Dysrhythmia, Fatty liver, H/O ETOH abuse, Hepatic encephalopathy ([place]), adenomatous polyp of colon, thrombocytopenia, Hypertension, Hypothyroidism, Macrocytic anemia, Mixed hyperlipidemia, NICM (nonischemic cardiomyopathy) ([place]), Nonobstructive CAD (coronary artery disease), Persistent atrial fibrillation ([place]), and Valvular heart disease.  PSH:    Past Surgical History:  Procedure Laterality Date   CARDIAC CATHETERIZATION     no stents   COLONOSCOPY     COLONOSCOPY WITH PROPOFOL N/A 07/26/2015   Procedure: COLONOSCOPY WITH PROPOFOL;  Surgeon: Manya Silvas, MD;  Location: [facility] ENDOSCOPY;  Service: Endoscopy;  Laterality: N/A;   COLONOSCOPY WITH PROPOFOL N/A 10/03/2019   Procedure: COLONOSCOPY WITH PROPOFOL;  Surgeon: Andrey Farmer  T, MD;  Location: [facility] ENDOSCOPY;  Service: Endoscopy;  Laterality: N/A;   ESOPHAGOGASTRODUODENOSCOPY (EGD) WITH PROPOFOL N/A  10/03/2019   Procedure: ESOPHAGOGASTRODUODENOSCOPY (EGD) WITH PROPOFOL;  Surgeon: Lesly Rubenstein, MD;  Location: [facility] ENDOSCOPY;  Service: Endoscopy;  Laterality: N/A;   HAMMER TOE SURGERY      Current Outpatient Medications  Medication Sig Dispense Refill   apixaban (ELIQUIS) 5 MG TABS tablet Take by mouth 2 (two) times daily.      atorvastatin (LIPITOR) 10 MG tablet Take 10 mg by mouth daily.     clobetasol cream (TEMOVATE) 0.78 % Apply 1 application topically 2 (two) times daily.     colchicine 0.6 MG tablet Take 0.6 mg by mouth 2 (two) times daily.      gabapentin (NEURONTIN) 600 MG tablet Take 600 mg by mouth 2 (two) times daily.     ketoconazole (NIZORAL) 2 % cream Apply 1 application topically at bedtime. 60 g 3   levothyroxine (SYNTHROID, LEVOTHROID) 100 MCG tablet Take 100 mcg by mouth every morning.  4   metoprolol succinate (TOPROL-XL) 25 MG 24 hr tablet Take 0.5 tablets (12.5 mg total) by mouth in the morning and at bedtime. 90 tablet 3   mupirocin ointment (BACTROBAN) 2 % Apply 1 application topically daily. To any open sores or crusted areas of legs 22 g 1   pantoprazole (PROTONIX) 20 MG tablet Take 40 mg by mouth daily.      potassium chloride SA (KLOR-CON) 20 MEQ tablet Take 1 tablet (20 mEq total) by mouth as directed. Take 1 tablet (20 mEq) once daily. Hold on Monday/Wednesdy/Friday 180 tablet 3   torsemide (DEMADEX) 20 MG tablet Take 1 tablet (20 mg total) by mouth as directed. Hold on Monday/Wednesdy/Friday 90 tablet 3   No current facility-administered medications for this visit.    Allergies:   Penicillins   Social History:  The patient  reports that he has quit smoking. His smokeless tobacco use includes snuff. He reports that he does not drink alcohol and does not use drugs.   Family History:   family history includes Heart Problems in his father, mother, and sister.   Review of Systems: Review of Systems  Constitutional: Negative.   HENT:  Negative.   Respiratory: Negative.   Cardiovascular: Negative.   Gastrointestinal: Negative.   Musculoskeletal: Negative.   Neurological: Negative.   Psychiatric/Behavioral: Negative.   All other systems reviewed and are negative.   PHYSICAL EXAM: VS:  BP 100/70 (BP Location: Right Arm, Patient Position: Sitting, Cuff Size: Large)    Pulse 72    Ht 6' 2" (1.88 m)    Wt 211 lb (95.7 kg)    SpO2 98%    BMI 27.09 kg/m  , BMI Body mass index is 27.09 kg/m. Constitutional:  oriented to person, place, and time. No distress.  HENT:  Head: Grossly normal Eyes:  no discharge. No scleral icterus.  Neck: No JVD, no carotid bruits  Cardiovascular: Regular rate and rhythm, no murmurs appreciated Pulmonary/Chest: Clear to auscultation bilaterally, no wheezes or rails Abdominal: Soft.  no distension.  no tenderness.  Musculoskeletal: Normal range of motion Neurological:  normal muscle tone. Coordination normal. No atrophy Skin: Skin warm and dry Psychiatric: normal affect, pleasant   Recent Labs: 04/05/2019: TSH 2.342 11/20/2019: ALT 13 12/25/2019: B Natriuretic Peptide 555.0; BUN 28; Creatinine, Ser 1.61; Hemoglobin 12.9; Magnesium 2.0; Platelets 94; Potassium 4.5; Sodium 138    Lipid Panel No results found for: CHOL, HDL, LDLCALC, TRIG  Wt Readings from Last 3 Encounters:  02/09/20 211 lb (95.7 kg)  12/26/19 207 lb (93.9 kg)  12/25/19 207 lb 7.3 oz (94.1 kg)     ASSESSMENT AND PLAN:  Problem List Items Addressed This Visit      Cardiology Problems   Persistent atrial fibrillation ([place]) - Primary   Essential hypertension   HFrEF (heart failure with reduced ejection fraction) (HCC)    Other Visit Diagnoses    NICM (nonischemic cardiomyopathy) (HCC)       NSVT (nonsustained ventricular tachycardia) (HCC)       Paroxysmal atrial fibrillation (HCC)       Stage 3a chronic kidney disease (HCC)       Chronic systolic CHF (congestive heart failure) ([place])         Nonischemic  cardiomyopathy Prior alcohol, reports cessation Entresto held secondary to hypotension On very low-dose metoprolol succinate 12.5 daily Takes torsemide 20 four times a week  MRI no infiltrative disease, ejection fraction up to 30 to 40% Previously declined repeat echo  Near syncope/shaking episodes Has seen neurology, EEG  pending ZIO monitor relatively benign but did not have episode Likely secondary to hypotension We will cut torsemide back to 3 times a week from 4 Some of the blood pressures provided by family today show systolic pressure 382 in the sitting position Pressure 505 systolic on today's visit We did discuss midodrine for further documented hypotension He does not want midodrine at this time, concerned about pushing his blood pressure high  Atrial fibrillation, persistent Prior ablation, Did not hold, converted back to atrial fibrillation Seen by [facility] EP, Asymptomatic from atrial fibrillation Tolerating Eliquis, no recent falls  Chronic systolic CHF Taking torsemide , will decrease down to days/week Weight stable, still with renal dysfunction creatinine 1.6   Total encounter time more than 35 minutes  Greater than 50% was spent in counseling and coordination of care with the patient   Signed, Esmond Plants, M.D., Ph.D. [place], [place]

## 2020-02-09 ENCOUNTER — Ambulatory Visit: Payer: PPO | Admitting: Cardiovascular Disease

## 2020-02-09 ENCOUNTER — Other Ambulatory Visit: Payer: Self-pay

## 2020-02-09 ENCOUNTER — Encounter: Payer: Self-pay | Admitting: Cardiovascular Disease

## 2020-02-09 VITALS — BP 100/70 | HR 72 | Ht 74.0 in | Wt 211.0 lb

## 2020-02-09 DIAGNOSIS — N1831 Chronic kidney disease, stage 3a: Secondary | ICD-10-CM | POA: Diagnosis not present

## 2020-02-09 DIAGNOSIS — I4729 Other ventricular tachycardia: Secondary | ICD-10-CM

## 2020-02-09 DIAGNOSIS — I472 Ventricular tachycardia: Secondary | ICD-10-CM | POA: Diagnosis not present

## 2020-02-09 DIAGNOSIS — I428 Other cardiomyopathies: Secondary | ICD-10-CM

## 2020-02-09 DIAGNOSIS — I5020 Unspecified systolic (congestive) heart failure: Secondary | ICD-10-CM | POA: Diagnosis not present

## 2020-02-09 DIAGNOSIS — I10 Essential (primary) hypertension: Secondary | ICD-10-CM | POA: Diagnosis not present

## 2020-02-09 DIAGNOSIS — I5022 Chronic systolic (congestive) heart failure: Secondary | ICD-10-CM

## 2020-02-09 DIAGNOSIS — I480 Paroxysmal atrial fibrillation: Secondary | ICD-10-CM | POA: Diagnosis not present

## 2020-02-09 DIAGNOSIS — I4819 Other persistent atrial fibrillation: Secondary | ICD-10-CM | POA: Diagnosis not present

## 2020-02-09 NOTE — Patient Instructions (Addendum)
Medication Instructions:  Take Torsemide 20 mg 3 times a week  All your cardiac medications have been updated for 90 day supply for one year, please call your pharmacy when refills are needed, they may be picked up today if needed.  Lab work: No new labs needed   Testing/Procedures: No new testing needed   Follow-Up: At [facility], you and your health needs are our priority.  As part of our continuing mission to provide you with exceptional heart care, we have created designated Provider Care Teams.  These Care Teams include your primary Cardiologist (physician) and Advanced Practice Providers (APPs -  Physician Assistants and Nurse Practitioners) who all work together to provide you with the care you need, when you need it.  . You will need a follow up appointment in 3 months  . Providers on your designated Care Team:   . Murray Hodgkins, NP . Christell Faith, PA-C . Marrianne Mood, PA-C  Any Other Special Instructions Will Be Listed Below (If Applicable).  COVID-19 Vaccine Information can be found at: [email or web address].uk For questions related to vaccine distribution or appointments, please email [email or web address] or call [phone].

## 2020-02-19 ENCOUNTER — Ambulatory Visit: Payer: PPO | Admitting: Dermatology

## 2020-02-22 DIAGNOSIS — I639 Cerebral infarction, unspecified: Secondary | ICD-10-CM | POA: Insufficient documentation

## 2020-02-22 DIAGNOSIS — R251 Tremor, unspecified: Secondary | ICD-10-CM | POA: Diagnosis not present

## 2020-02-22 DIAGNOSIS — G250 Essential tremor: Secondary | ICD-10-CM | POA: Diagnosis not present

## 2020-02-22 DIAGNOSIS — R42 Dizziness and giddiness: Secondary | ICD-10-CM | POA: Diagnosis not present

## 2020-03-22 ENCOUNTER — Inpatient Hospital Stay: Payer: PPO | Attending: Oncology

## 2020-03-22 ENCOUNTER — Other Ambulatory Visit: Payer: Self-pay

## 2020-03-22 DIAGNOSIS — E039 Hypothyroidism, unspecified: Secondary | ICD-10-CM | POA: Diagnosis not present

## 2020-03-22 DIAGNOSIS — D472 Monoclonal gammopathy: Secondary | ICD-10-CM | POA: Insufficient documentation

## 2020-03-22 DIAGNOSIS — D6959 Other secondary thrombocytopenia: Secondary | ICD-10-CM | POA: Insufficient documentation

## 2020-03-22 DIAGNOSIS — Z79899 Other long term (current) drug therapy: Secondary | ICD-10-CM | POA: Insufficient documentation

## 2020-03-22 DIAGNOSIS — E119 Type 2 diabetes mellitus without complications: Secondary | ICD-10-CM | POA: Insufficient documentation

## 2020-03-22 DIAGNOSIS — I5032 Chronic diastolic (congestive) heart failure: Secondary | ICD-10-CM | POA: Diagnosis not present

## 2020-03-22 DIAGNOSIS — Z7901 Long term (current) use of anticoagulants: Secondary | ICD-10-CM | POA: Insufficient documentation

## 2020-03-22 DIAGNOSIS — N183 Chronic kidney disease, stage 3 unspecified: Secondary | ICD-10-CM | POA: Insufficient documentation

## 2020-03-22 DIAGNOSIS — I4891 Unspecified atrial fibrillation: Secondary | ICD-10-CM | POA: Insufficient documentation

## 2020-03-22 DIAGNOSIS — I110 Hypertensive heart disease with heart failure: Secondary | ICD-10-CM | POA: Diagnosis not present

## 2020-03-22 DIAGNOSIS — Z87891 Personal history of nicotine dependence: Secondary | ICD-10-CM | POA: Diagnosis not present

## 2020-03-22 LAB — BASIC METABOLIC PANEL
Anion gap: 11 (ref 5–15)
BUN: 23 mg/dL (ref 8–23)
CO2: 22 mmol/L (ref 22–32)
Calcium: 9 mg/dL (ref 8.9–10.3)
Chloride: 105 mmol/L (ref 98–111)
Creatinine, Ser: 1.39 mg/dL — ABNORMAL HIGH (ref 0.61–1.24)
GFR, Estimated: 55 mL/min — ABNORMAL LOW (ref >60)
Glucose, Bld: 123 mg/dL — ABNORMAL HIGH (ref 70–99)
Potassium: 4 mmol/L (ref 3.5–5.1)
Sodium: 138 mmol/L (ref 135–145)

## 2020-03-22 LAB — CBC WITH DIFFERENTIAL/PLATELET
Abs Immature Granulocytes: 0.03 10*3/uL (ref 0.00–0.07)
Basophils Absolute: 0 10*3/uL (ref 0.0–0.1)
Basophils Relative: 1 %
Eosinophils Absolute: 0.1 10*3/uL (ref 0.0–0.5)
Eosinophils Relative: 1 %
HCT: 38 % — ABNORMAL LOW (ref 39.0–52.0)
Hemoglobin: 12.3 g/dL — ABNORMAL LOW (ref 13.0–17.0)
Immature Granulocytes: 1 %
Lymphocytes Relative: 21 %
Lymphs Abs: 0.9 10*3/uL (ref 0.7–4.0)
MCH: 32.1 pg (ref 26.0–34.0)
MCHC: 32.4 g/dL (ref 30.0–36.0)
MCV: 99.2 fL (ref 80.0–100.0)
Monocytes Absolute: 0.5 10*3/uL (ref 0.1–1.0)
Monocytes Relative: 12 %
Neutro Abs: 2.7 10*3/uL (ref 1.7–7.7)
Neutrophils Relative %: 64 %
Platelets: 87 10*3/uL — ABNORMAL LOW (ref 150–400)
RBC: 3.83 MIL/uL — ABNORMAL LOW (ref 4.22–5.81)
RDW: 15.9 % — ABNORMAL HIGH (ref 11.5–15.5)
WBC: 4.2 10*3/uL (ref 4.0–10.5)
nRBC: 0 % (ref 0.0–0.2)

## 2020-03-23 LAB — IGG, IGA, IGM
IgA: 383 mg/dL (ref 61–437)
IgG (Immunoglobin G), Serum: 2706 mg/dL — ABNORMAL HIGH (ref 603–1613)
IgM (Immunoglobulin M), Srm: 94 mg/dL (ref 20–172)

## 2020-03-25 LAB — PROTEIN ELECTROPHORESIS, SERUM
A/G Ratio: 0.8 (ref 0.7–1.7)
Albumin ELP: 3.7 g/dL (ref 2.9–4.4)
Alpha-1-Globulin: 0.3 g/dL (ref 0.0–0.4)
Alpha-2-Globulin: 0.8 g/dL (ref 0.4–1.0)
Beta Globulin: 1.2 g/dL (ref 0.7–1.3)
Gamma Globulin: 2.6 g/dL — ABNORMAL HIGH (ref 0.4–1.8)
Globulin, Total: 4.9 g/dL — ABNORMAL HIGH (ref 2.2–3.9)
M-Spike, %: 1.9 g/dL — ABNORMAL HIGH
Total Protein ELP: 8.6 g/dL — ABNORMAL HIGH (ref 6.0–8.5)

## 2020-03-25 LAB — KAPPA/LAMBDA LIGHT CHAINS
Kappa free light chain: 800.6 mg/L — ABNORMAL HIGH (ref 3.3–19.4)
Kappa, lambda light chain ratio: 29.33 — ABNORMAL HIGH (ref 0.26–1.65)
Lambda free light chains: 27.3 mg/L — ABNORMAL HIGH (ref 5.7–26.3)

## 2020-03-29 ENCOUNTER — Inpatient Hospital Stay (HOSPITAL_BASED_OUTPATIENT_CLINIC_OR_DEPARTMENT_OTHER): Payer: PPO | Admitting: Oncology

## 2020-03-29 ENCOUNTER — Encounter: Payer: Self-pay | Admitting: Oncology

## 2020-03-29 VITALS — BP 157/97 | HR 56 | Temp 96.1°F | Wt 215.9 lb

## 2020-03-29 DIAGNOSIS — D472 Monoclonal gammopathy: Secondary | ICD-10-CM

## 2020-03-29 DIAGNOSIS — C9000 Multiple myeloma not having achieved remission: Secondary | ICD-10-CM

## 2020-03-29 NOTE — Progress Notes (Signed)
[place]  [phone] [phone]) [phone]  ID: TOA MIA OB: May 26, 1950  MR#: [id number]  EXB#:[id number]  Patient Care Team: Albina Billet, MD as PCP - General (Internal Medicine) Minna Merritts, MD as PCP - Cardiology (Cardiology) Lloyd Huger, MD as Consulting Physician (Oncology)  CHIEF COMPLAINT: Smoldering myeloma.  INTERVAL HISTORY: Patient returns to clinic for further evaluation and discussion of his laboratory work.  He was last seen in clinic on 12/01/2019.  In the interim, he was seen in ED on  12/25/2019 for a near syncopal episode.  There was some concern for seizures.  He was monitored for several hours with no recurrence.  He is chronically in atrial fibrillation but rate controlled.  He was discharged home.  He lives at home with his girlfriend and has done well.  Denies any additional falls or syncopal episodes.  Has occasional right upper and lower quadrant abdominal pain.  Endorses intermittent testicular swelling.  He was told this was secondary to ascites due to his cirrhosis.  His appetite has been fair.  His weight is currently stable.  He denies any chest pain, shortness of breath, cough, or hemoptysis.  He denies any nausea, vomiting, constipation, or diarrhea.  He has no melena or hematochezia.  He has no urinary complaints. His peripheral edema has nearly resolved. Patient offers no further specific complaints.   REVIEW OF SYSTEMS:   Review of Systems  Constitutional: Positive for malaise/fatigue. Negative for fever.  Respiratory: Negative.  Negative for cough, hemoptysis and shortness of breath.   Cardiovascular: Negative.  Negative for chest pain and leg swelling.  Gastrointestinal: Positive for abdominal pain.  Genitourinary: Negative.  Negative for dysuria.  Musculoskeletal: Positive for falls. Negative for back pain.  Skin: Negative.  Negative for rash.  Neurological: Positive for weakness. Negative for  dizziness, focal weakness and headaches.  Psychiatric/Behavioral: Negative.  The patient is not nervous/anxious.     As per HPI. Otherwise, a complete review of systems is negative.  PAST MEDICAL HISTORY: Past Medical History:  Diagnosis Date  . (HFpEF) heart failure with preserved ejection fraction ([place])    a. 03/2019 cMRI: EF 30-40%, glob HK; b. 05/2019 Echo [facility]): EF 35%.  . CHF (congestive heart failure) ([place])   . CKD (chronic kidney disease), stage III ([place])   . Coumadin resistance ([place])   . Diabetes mellitus without complication ([place])   . Dysrhythmia    A. Fib  . Fatty liver   . H/O ETOH abuse   . Hepatic encephalopathy ([place])    history  . Hx of adenomatous polyp of colon   . Hx of thrombocytopenia   . Hypertension   . Hypothyroidism   . Macrocytic anemia   . Mixed hyperlipidemia   . NICM (nonischemic cardiomyopathy) ([place])    a. 03/2019 cMRI: EF 30-40%, glob HK. No LGE. No evidence of infiltrative cardiomyopathy; b. 05/2019 Echo [facility]): EF 35%, mild to mod MR, sev dil LA, sev dil RV w/ mod reduced RV fxn. Sev TR. Mild-mod PAH (PASP 41mmHg), sev dil RA.  [name] Nonobstructive CAD (coronary artery disease)    a. 02/2019 Cath [facility]): LM nl, LAD 5m, D1 small, D2 60ost, D3 small, LCX nl, OM2 25, RCA 3m, 25d.  [name] Persistent atrial fibrillation ([place])    a. CHA2DS2VASc = 5-->eliquis; b. 05/2019 s/p RFCA/PVI [facility]).  . Valvular heart disease    a. 05/2019 Echo [facility]): mild to mod MR, sev TR.    PAST SURGICAL HISTORY:  Past Surgical History:  Procedure Laterality Date  . CARDIAC CATHETERIZATION     no stents  . COLONOSCOPY    . COLONOSCOPY WITH PROPOFOL N/A 07/26/2015   Procedure: COLONOSCOPY WITH PROPOFOL;  Surgeon: Manya Silvas, MD;  Location: [facility] ENDOSCOPY;  Service: Endoscopy;  Laterality: N/A;  . COLONOSCOPY WITH PROPOFOL N/A 10/03/2019   Procedure: COLONOSCOPY WITH PROPOFOL;  Surgeon: Lesly Rubenstein, MD;  Location: [facility] ENDOSCOPY;  Service: Endoscopy;  Laterality: N/A;  .  ESOPHAGOGASTRODUODENOSCOPY (EGD) WITH PROPOFOL N/A 10/03/2019   Procedure: ESOPHAGOGASTRODUODENOSCOPY (EGD) WITH PROPOFOL;  Surgeon: Lesly Rubenstein, MD;  Location: [facility] ENDOSCOPY;  Service: Endoscopy;  Laterality: N/A;  . HAMMER TOE SURGERY      FAMILY HISTORY: Family History  Problem Relation Age of Onset  . Heart Problems Mother   . Heart Problems Father   . Heart Problems Sister     ADVANCED DIRECTIVES (Y/N):  N  HEALTH MAINTENANCE: Social History   Tobacco Use  . Smoking status: Former Research scientist (life sciences)  . Smokeless tobacco: Current User    Types: Snuff  Vaping Use  . Vaping Use: Never used  Substance Use Topics  . Alcohol use: No  . Drug use: No     Colonoscopy:  PAP:  Bone density:  Lipid panel:  Allergies  Allergen Reactions  . Penicillins Hives    Current Outpatient Medications  Medication Sig Dispense Refill  . apixaban (ELIQUIS) 5 MG TABS tablet Take by mouth 2 (two) times daily.     [name] atorvastatin (LIPITOR) 10 MG tablet Take 10 mg by mouth daily.    . clobetasol cream (TEMOVATE) 1.75 % Apply 1 application topically 2 (two) times daily.    . colchicine 0.6 MG tablet Take 0.6 mg by mouth 2 (two) times daily.     [name] gabapentin (NEURONTIN) 600 MG tablet Take 600 mg by mouth 2 (two) times daily.    [name] ketoconazole (NIZORAL) 2 % cream Apply 1 application topically at bedtime. 60 g 3  . levothyroxine (SYNTHROID, LEVOTHROID) 100 MCG tablet Take 100 mcg by mouth every morning.  4  . metoprolol succinate (TOPROL-XL) 25 MG 24 hr tablet Take 0.5 tablets (12.5 mg total) by mouth in the morning and at bedtime. 90 tablet 3  . mupirocin ointment (BACTROBAN) 2 % Apply 1 application topically daily. To any open sores or crusted areas of legs 22 g 1  . pantoprazole (PROTONIX) 20 MG tablet Take 40 mg by mouth daily.     . potassium chloride SA (KLOR-CON) 20 MEQ tablet Take 1 tablet (20 mEq total) by mouth as directed. Take 1 tablet (20 mEq) once daily. Hold on Monday/Wednesdy/Friday  180 tablet 3  . torsemide (DEMADEX) 20 MG tablet Take 1 tablet (20 mg total) by mouth as directed. Hold on Monday/Wednesdy/Friday 90 tablet 3   No current facility-administered medications for this visit.    OBJECTIVE: There were no vitals filed for this visit.   There is no height or weight on file to calculate BMI.    ECOG FS:1 - Symptomatic but completely ambulatory  Physical Exam Constitutional:      Appearance: Normal appearance.  HENT:     Head: Normocephalic and atraumatic.  Eyes:     Pupils: Pupils are equal, round, and reactive to light.  Cardiovascular:     Rate and Rhythm: Normal rate and regular rhythm.     Heart sounds: Normal heart sounds. No murmur heard.   Pulmonary:     Effort: Pulmonary effort is  normal.     Breath sounds: Normal breath sounds. No wheezing.  Abdominal:     General: Bowel sounds are normal. There is no distension.     Palpations: Abdomen is soft.     Tenderness: There is no abdominal tenderness.  Musculoskeletal:        General: Normal range of motion.     Cervical back: Normal range of motion.  Skin:    General: Skin is warm and dry.     Findings: No rash.  Neurological:     Mental Status: He is alert and oriented to person, place, and time.  Psychiatric:        Judgment: Judgment normal.      LAB RESULTS:  Lab Results  Component Value Date   NA 138 03/22/2020   K 4.0 03/22/2020   CL 105 03/22/2020   CO2 22 03/22/2020   GLUCOSE 123 (H) 03/22/2020   BUN 23 03/22/2020   CREATININE 1.39 (H) 03/22/2020   CALCIUM 9.0 03/22/2020   PROT 9.1 (H) 11/20/2019   ALBUMIN 3.7 11/20/2019   AST 18 11/20/2019   ALT 13 11/20/2019   ALKPHOS 127 (H) 11/20/2019   BILITOT 0.9 11/20/2019   GFRNONAA 55 (L) 03/22/2020   GFRAA 47 (L) 08/02/2019    Lab Results  Component Value Date   WBC 4.2 03/22/2020   NEUTROABS 2.7 03/22/2020   HGB 12.3 (L) 03/22/2020   HCT 38.0 (L) 03/22/2020   MCV 99.2 03/22/2020   PLT 87 (L) 03/22/2020   Lab  Results  Component Value Date   TOTALPROTELP 8.6 (H) 03/22/2020   ALBUMINELP 3.7 03/22/2020   A1GS 0.3 03/22/2020   A2GS 0.8 03/22/2020   BETS 1.2 03/22/2020   GAMS 2.6 (H) 03/22/2020   MSPIKE 1.9 (H) 03/22/2020   SPEI Comment 03/22/2020      STUDIES: No results found.  ASSESSMENT: Smoldering myeloma  PLAN:    1.  Smoldering myeloma:  -B[facility] marrow [facility] at [facility] on 02/14/2019 revealed 26% plasma cells. -FISH and cytogenetics are unknown. -Metastatic bone survey from 01/09/2019 did not reveal any suspicious bony lesions. -Labs from 03/22/2020 show M spike of 1.9, hemoglobin 12.3, platelets 87,000 creatinine 1.39 and a calcium of 9.0. -His kappa free light chains are 800 and kappa lambda light chain ratio is 29.3. -Plan was for close follow-up and possible PET scan if labs/symptoms warranted.  -RTC for follow-up in 4 months.  2.  Thrombocytopenia:  -Secondary to cirrhosis -Platelet count is 87,000.   -Stable  3.  Renal insufficiency:  -Labs from 03/22/2020 show a creatinine of 1.39 and BUN of 23.  4.  Atrial fibrillation: -He is followed by cardiology. -He is rate controlled with metoprolol and is on Eliquis.  5.  Congestive heart failure: -Followed by cardiology. -On torsemide 20 mg.    6.  Abdominal pain: -Appears to be intermittent.  -Likely secondary to cirrhosis and splenomegaly. -Continue to monitor.  7.  Seizure-like activity/syncope: -EEG negative, MRI negative -Thought to be secondary to hypotension. -Cardiology is currently manipulating his medications.  -He is followed by neurology Dr. Melrose Nakayama and has follow-up next month.  Disposition: -RTC in 4 months for lab work and follow-up.  Greater than 50% was spent in counseling and coordination of care with this patient including but not limited to discussion of the relevant topics above (See A&P) including, but not limited to diagnosis and management of acute and chronic medical conditions.   Patient  expressed understanding and was in agreement with  this plan. He also understands that He can call clinic at any time with any questions, concerns, or complaints.    Jacquelin Hawking, NP   03/29/2020 10:01 AM

## 2020-04-08 ENCOUNTER — Ambulatory Visit: Payer: PPO | Admitting: Dermatology

## 2020-04-09 DIAGNOSIS — K7030 Alcoholic cirrhosis of liver without ascites: Secondary | ICD-10-CM | POA: Diagnosis not present

## 2020-04-09 DIAGNOSIS — R933 Abnormal findings on diagnostic imaging of other parts of digestive tract: Secondary | ICD-10-CM | POA: Diagnosis not present

## 2020-04-23 DIAGNOSIS — G250 Essential tremor: Secondary | ICD-10-CM | POA: Diagnosis not present

## 2020-04-23 DIAGNOSIS — R42 Dizziness and giddiness: Secondary | ICD-10-CM | POA: Diagnosis not present

## 2020-04-23 DIAGNOSIS — R278 Other lack of coordination: Secondary | ICD-10-CM | POA: Insufficient documentation

## 2020-04-23 DIAGNOSIS — R251 Tremor, unspecified: Secondary | ICD-10-CM | POA: Diagnosis not present

## 2020-05-06 ENCOUNTER — Other Ambulatory Visit: Payer: Self-pay

## 2020-05-06 ENCOUNTER — Ambulatory Visit (INDEPENDENT_AMBULATORY_CARE_PROVIDER_SITE_OTHER): Payer: PPO | Admitting: Vascular Surgery

## 2020-05-06 ENCOUNTER — Encounter (INDEPENDENT_AMBULATORY_CARE_PROVIDER_SITE_OTHER): Payer: Self-pay | Admitting: Vascular Surgery

## 2020-05-06 VITALS — BP 124/85 | HR 72 | Resp 16 | Wt 229.6 lb

## 2020-05-06 DIAGNOSIS — I890 Lymphedema, not elsewhere classified: Secondary | ICD-10-CM

## 2020-05-06 DIAGNOSIS — I872 Venous insufficiency (chronic) (peripheral): Secondary | ICD-10-CM | POA: Diagnosis not present

## 2020-05-06 DIAGNOSIS — E782 Mixed hyperlipidemia: Secondary | ICD-10-CM

## 2020-05-06 DIAGNOSIS — I4819 Other persistent atrial fibrillation: Secondary | ICD-10-CM | POA: Diagnosis not present

## 2020-05-06 DIAGNOSIS — I10 Essential (primary) hypertension: Secondary | ICD-10-CM | POA: Diagnosis not present

## 2020-05-06 NOTE — Progress Notes (Signed)
MRN : [id number]  [name] is a 70 y.o. (01-20-1950) male who presents with chief complaint of  Chief Complaint  Patient presents with  . Follow-up    6 month follow up  .  History of Present Illness:  The patient returns to the office for followup evaluation regarding leg swelling.  The swelling has improved quite a bit and the pain associated with swelling has decreased substantially. There have not been any interval development of a ulcerations or wounds.  Since the previous visit the patient has been wearing graduated compression stockings and has noted little significant improvement in the lymphedema. The patient has been using compression routinely morning until night.  The patient also states elevation during the day and exercise is being done too.   No outpatient medications have been marked as taking for the 05/06/20 encounter (Office Visit) with Delana Meyer, Dolores Lory, MD.    Past Medical History:  Diagnosis Date  . (HFpEF) heart failure with preserved ejection fraction ([place])    a. 03/2019 cMRI: EF 30-40%, glob HK; b. 05/2019 Echo [facility]): EF 35%.  . CHF (congestive heart failure) ([place])   . CKD (chronic kidney disease), stage III ([place])   . Coumadin resistance ([place])   . Diabetes mellitus without complication ([place])   . Dysrhythmia    A. Fib  . Fatty liver   . H/O ETOH abuse   . Hepatic encephalopathy ([facility])    history  . Hx of adenomatous polyp of colon   . Hx of thrombocytopenia   . Hypertension   . Hypothyroidism   . Macrocytic anemia   . Mixed hyperlipidemia   . NICM (nonischemic cardiomyopathy) ([place])    a. 03/2019 cMRI: EF 30-40%, glob HK. No LGE. No evidence of infiltrative cardiomyopathy; b. 05/2019 Echo [facility]): EF 35%, mild to mod MR, sev dil LA, sev dil RV w/ mod reduced RV fxn. Sev TR. Mild-mod PAH (PASP 41mmHg), sev dil RA.  [name] Nonobstructive CAD (coronary artery disease)    a. 02/2019 Cath [facility]): LM nl, LAD 32m, D1 small, D2 60ost, D3 small, LCX nl, OM2 25,  RCA 73m, 25d.  [name] Persistent atrial fibrillation ([place])    a. CHA2DS2VASc = 5-->eliquis; b. 05/2019 s/p RFCA/[facility]I [facility]).  . Valvular heart disease    a. 05/2019 Echo [facility]): mild to mod MR, sev TR.    Past Surgical History:  Procedure Laterality Date  . CARDIAC CATHETERIZATION     no stents  . COLONOSCOPY    . COLONOSCOPY WITH PROPOFOL N/A 07/26/2015   Procedure: COLONOSCOPY WITH PROPOFOL;  Surgeon: Manya Silvas, MD;  Location: [facility] ENDOSCOPY;  Service: Endoscopy;  Laterality: N/A;  . COLONOSCOPY WITH PROPOFOL N/A 10/03/2019   Procedure: COLONOSCOPY WITH PROPOFOL;  Surgeon: Lesly Rubenstein, MD;  Location: [facility] ENDOSCOPY;  Service: Endoscopy;  Laterality: N/A;  . ESOPHAGOGASTRODUODENOSCOPY (EGD) WITH PROPOFOL N/A 10/03/2019   Procedure: ESOPHAGOGASTRODUODENOSCOPY (EGD) WITH PROPOFOL;  Surgeon: Lesly Rubenstein, MD;  Location: [facility] ENDOSCOPY;  Service: Endoscopy;  Laterality: N/A;  . HAMMER TOE SURGERY      Social History Social History   Tobacco Use  . Smoking status: Former Research scientist (life sciences)  . Smokeless tobacco: Current User    Types: Snuff  Vaping Use  . Vaping Use: Never used  Substance Use Topics  . Alcohol use: No  . Drug use: No    Family History Family History  Problem Relation Age of Onset  . Heart Problems Mother   . Heart Problems Father   .  Heart Problems Sister     Allergies  Allergen Reactions  . Penicillins Hives     REVIEW OF SYSTEMS (Negative unless checked)  Constitutional: [] Weight loss  [] Fever  [] Chills Cardiac: [] Chest pain   [] Chest pressure   [] Palpitations   [] Shortness of breath when laying flat   [] Shortness of breath with exertion. Vascular:  [] Pain in legs with walking   [] Pain in legs at rest  [] History of DVT   [] Phlebitis   [] Swelling in legs   [] Varicose veins   [] Non-healing ulcers Pulmonary:   [] Uses home oxygen   [] Productive cough   [] Hemoptysis   [] Wheeze  [] COPD   [] Asthma Neurologic:  [] Dizziness   [] Seizures   [] History of stroke    [] History of TIA  [] Aphasia   [] Vissual changes   [] Weakness or numbness in arm   [] Weakness or numbness in leg Musculoskeletal:   [] Joint swelling   [] Joint pain   [] Low back pain Hematologic:  [] Easy bruising  [] Easy bleeding   [] Hypercoagulable state   [] Anemic Gastrointestinal:  [] Diarrhea   [] Vomiting  [] Gastroesophageal reflux/heartburn   [] Difficulty swallowing. Genitourinary:  [] Chronic kidney disease   [] Difficult urination  [] Frequent urination   [] Blood in urine Skin:  [] Rashes   [] Ulcers  Psychological:  [] History of anxiety   []  History of major depression.  Physical Examination  There were no vitals filed for this visit. There is no height or weight on file to calculate BMI. Gen: WD/WN, NAD Head: [place]/AT, No temporalis wasting.  Ear/Nose/Throat: Hearing grossly intact, nares w/o erythema or drainage Eyes: PER, EOMI, sclera nonicteric.  Neck: Supple, no large masses.   Pulmonary:  Good air movement, no audible wheezing bilaterally, no use of accessory muscles.  Cardiac: RRR, no JVD Vascular: scattered varicosities present bilaterally.  Severe venous stasis changes to the legs bilaterally.  2-3+ soft pitting edema. Vessel Right Left  Radial Palpable Palpable  Gastrointestinal: Non-distended. No guarding/no peritoneal signs.  Musculoskeletal: M/S 5/5 throughout.  No deformity or atrophy.  Neurologic: CN 2-12 intact. Symmetrical.  Speech is fluent. Motor exam as listed above. Psychiatric: Judgment intact, Mood & affect appropriate for pt's clinical situation. Dermatologic: Severe venous rashes no ulcers noted.  No changes consistent with cellulitis. Lymph : + lichenification or skin changes of chronic lymphedema.  CBC Lab Results  Component Value Date   WBC 4.2 03/22/2020   HGB 12.3 (L) 03/22/2020   HCT 38.0 (L) 03/22/2020   MCV 99.2 03/22/2020   PLT 87 (L) 03/22/2020    BMET    Component Value Date/Time   NA 138 03/22/2020 1050   NA 139 04/10/2019 0945   NA 136  01/13/2013 0405   K 4.0 03/22/2020 1050   K 4.6 01/13/2013 0405   CL 105 03/22/2020 1050   CL 102 01/13/2013 0405   CO2 22 03/22/2020 1050   CO2 31 01/13/2013 0405   GLUCOSE 123 (H) 03/22/2020 1050   GLUCOSE 129 (H) 01/13/2013 0405   BUN 23 03/22/2020 1050   BUN 19 04/10/2019 0945   BUN 14 01/13/2013 0405   CREATININE 1.39 (H) 03/22/2020 1050   CREATININE 1.32 (H) 01/13/2013 0405   CALCIUM 9.0 03/22/2020 1050   CALCIUM 8.8 01/13/2013 0405   GFRNONAA 55 (L) 03/22/2020 1050   GFRNONAA 57 (L) 01/13/2013 0405   GFRAA 47 (L) 08/02/2019 1050   GFRAA >60 01/13/2013 0405   CrCl cannot be calculated (Patient's most recent lab result is older than the maximum 21 days allowed.).  COAG Lab  Results  Component Value Date   INR 1.4 (H) 04/04/2019   INR 1.1 02/22/2012    Radiology No results found.   Assessment/Plan 1. Lymphedema No surgery or intervention at this point in time.  I have reviewed my discussion with the patient regarding venous insufficiency and why it causes symptoms. I have discussed with the patient the chronic skin changes that accompany venous insufficiency and the long term sequela such as ulceration. Patient will contnue wearing graduated compression stockings on a daily basis, as this has provided excellent control of his edema. The patient will put the stockings on first thing in the morning and removing them in the evening. The patient is reminded not to sleep in the stockings.  In addition, behavioral modification including elevation during the day will be initiated.  Exercise is strongly encouraged.  Given the patient's reasonable, but not optimal, control and lack of any problems regarding the venous insufficiency and lymphedema a lymph pump was discussed but the patient wants to think about it.  The patient will follow up with me PRN should anything change.  The patient voices agreement with this plan.   2. Chronic venous insufficiency No surgery or  intervention at this point in time.  I have reviewed my discussion with the patient regarding venous insufficiency and why it causes symptoms. I have discussed with the patient the chronic skin changes that accompany venous insufficiency and the long term sequela such as ulceration. Patient will contnue wearing graduated compression stockings on a daily basis, as this has provided excellent control of his edema. The patient will put the stockings on first thing in the morning and removing them in the evening. The patient is reminded not to sleep in the stockings.  In addition, behavioral modification including elevation during the day will be initiated.  Exercise is strongly encouraged.  Given the patient's reasonable, but not optimal, control and lack of any problems regarding the venous insufficiency and lymphedema a lymph pump was discussed but the patient wants to think about it.  The patient will follow up with me PRN should anything change.  The patient voices agreement with this plan.   3. Persistent atrial fibrillation (HCC) Continue antiarrhythmia medications as already ordered, these medications have been reviewed and there are no changes at this time.  Continue anticoagulation as ordered by Cardiology Service   4. Essential hypertension Continue antihypertensive medications as already ordered, these medications have been reviewed and there are no changes at this time.   5. Mixed hyperlipidemia Continue statin as ordered and reviewed, no changes at this time    Hortencia Pilar, MD  05/06/2020 11:33 AM

## 2020-05-12 NOTE — Progress Notes (Signed)
Cardiology Office Note  Date:  05/13/2020   ID:  [name], [name] 11-27-1950, MRN [id number]  PCP:  Albina Billet, MD   Chief Complaint  Patient presents with  . 3 month follow up     "Patient c/o shortness of breath with over exertion. Medications reviewed by the patient verbally.     HPI:  [name] is a 70 year old gentleman with past medical history of Nonischemic cardiomyopathy, EF 42% Chronic systolic CHF CKD Cirrhosis, NASH, etoh Atrial fibrillation, s/p DCCV 02/16/19 afib ablation at [facility] with TEE 05/2019 LHC 02/15/19 demonstrated 60% ostial D2 stenosis.  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 Melrose Nakayama, has restarted doing his balance exercises  Drinks "Lots of water" Torsemide 3x a week  No ascites  TEE: 05/2019: reviewed The left ventricular systolic function is severely decreased.  The right ventricle is moderately dilated in size, with severely reduced  systolic function.   Labs reviewed CR 1.6, baseline  On lactulose QOD  EKG personally reviewed by myself on todays visit Shows atrial fibrillation ventricular rate 95 bpm no significant ST-T wave changes pvcs  Other past medical history reviewed Pushing the cart at the store November 20, 2019 Dizzy, near syncope, episodes of shaking Was seen in the emergency room Noted hypotension CXR showed some vascular congestion and HS-troponin were 23 and 25, he was recommended to hold Entresto   ED 12/25/2019 due to "shaking" episode and near syncope.  workup notable for BNP 555, HS-troponin 29 ? 33. Stable renal function 1.61, GFR 46. Hb 12.9. CXR with cardiomegaly and improved pulmonary vascular congestion  Seen by  neurology EEG pending  zio monitor, reviewed with him in detail Atrial Fibrillation occurred continuously (100% burden), ranging from 35-187 bpm (avg of 66 bpm).  5 Ventricular Tachycardia runs occurred, the run with the fastest interval lasting 4 beats with a max rate of 156 bpm, the longest lasting 15 beats with an avg rate of 124 bpm.   Difficulty discerning atrial activity making definitive diagnosis difficult to ascertain.  Idioventricular Rhythm was present.  Isolated VEs were occasional (3.6%, [id number]), VE Couplets were rare (<1.0%, 15), and no VE Triplets were present. Ventricular Bigeminy and Trigeminy were present.  Lab work reviewed CR 1.6.  Taking torsemide 4 times a week  In the hospital 04/04/19, with fever 101  left lower extremity  cellulitis. ,treated with keflex elevated ammonia of 82, likely hepatic encephalopathy.     History of EtOH, NASH At d/c , diuretics were held, [place] held (presumably for elevated creatinine)  Cardiac MRI  no infiltrative disease, ejection fraction up to 30 to 40%  05/02/2019: seen by Dr. Caryl Comes EP: Recommendation to proceed with A. fib ablation   PMH:   has a past medical history of (HFpEF) heart failure with preserved ejection fraction ([place]), CHF (congestive heart failure) ([place]), CKD (chronic kidney disease), stage III ([place]), Coumadin resistance ([place]), Diabetes mellitus without complication ([place]), Dysrhythmia, Fatty liver, H/O ETOH abuse, Hepatic encephalopathy ([place]), adenomatous polyp of colon, thrombocytopenia, Hypertension, Hypothyroidism, Macrocytic anemia, Mixed hyperlipidemia, NICM (nonischemic cardiomyopathy) ([place]), Nonobstructive CAD (coronary artery disease), Persistent atrial fibrillation ([place]), and Valvular heart disease.  PSH:    Past Surgical History:  Procedure Laterality Date  .  CARDIAC CATHETERIZATION     no stents  . COLONOSCOPY    . COLONOSCOPY WITH PROPOFOL N/A 07/26/2015   Procedure: COLONOSCOPY WITH PROPOFOL;   Surgeon: Manya Silvas, MD;  Location: [facility] ENDOSCOPY;  Service: Endoscopy;  Laterality: N/A;  . COLONOSCOPY WITH PROPOFOL N/A 10/03/2019   Procedure: COLONOSCOPY WITH PROPOFOL;  Surgeon: Lesly Rubenstein, MD;  Location: [facility] ENDOSCOPY;  Service: Endoscopy;  Laterality: N/A;  . ESOPHAGOGASTRODUODENOSCOPY (EGD) WITH PROPOFOL N/A 10/03/2019   Procedure: ESOPHAGOGASTRODUODENOSCOPY (EGD) WITH PROPOFOL;  Surgeon: Lesly Rubenstein, MD;  Location: [facility] ENDOSCOPY;  Service: Endoscopy;  Laterality: N/A;  . HAMMER TOE SURGERY      Current Outpatient Medications  Medication Sig Dispense Refill  . apixaban (ELIQUIS) 5 MG TABS tablet Take by mouth 2 (two) times daily.     [name] atorvastatin (LIPITOR) 10 MG tablet Take 10 mg by mouth daily.    . clobetasol cream (TEMOVATE) 4.66 % Apply 1 application topically 2 (two) times daily.    . colchicine 0.6 MG tablet Take 0.6 mg by mouth 2 (two) times daily.     [name] gabapentin (NEURONTIN) 600 MG tablet Take 600 mg by mouth 3 (three) times daily.    [name] ketoconazole (NIZORAL) 2 % cream Apply 1 application topically at bedtime. 60 g 3  . lactulose (CHRONULAC) 10 GM/15ML solution Take 10 g by mouth.    . levothyroxine (SYNTHROID, LEVOTHROID) 100 MCG tablet Take 100 mcg by mouth every morning.  4  . metoprolol succinate (TOPROL-XL) 25 MG 24 hr tablet Take 0.5 tablets (12.5 mg total) by mouth in the morning and at bedtime. 90 tablet 3  . mupirocin ointment (BACTROBAN) 2 % Apply 1 application topically daily. To any open sores or crusted areas of legs 22 g 1  . pantoprazole (PROTONIX) 20 MG tablet Take 40 mg by mouth daily.     . potassium chloride SA (KLOR-CON) 20 MEQ tablet Take 1 tablet (20 mEq total) by mouth as directed. Take 1 tablet (20 mEq) once daily. Hold on Monday/Wednesdy/Friday 180 tablet 3  . torsemide (DEMADEX) 20 MG tablet Take 1 tablet (20 mg total) by mouth as directed. Hold on Monday/Wednesdy/Friday 90 tablet 3   No current facility-administered  medications for this visit.    Allergies:   Penicillins   Social History:  The patient  reports that he has quit smoking. His smokeless tobacco use includes snuff. He reports that he does not drink alcohol and does not use drugs.   Family History:   family history includes Heart Problems in his father, mother, and sister.   Review of Systems: Review of Systems  Constitutional: Negative.   HENT: Negative.   Respiratory: Negative.   Cardiovascular: Negative.   Gastrointestinal: Negative.   Musculoskeletal: Negative.   Neurological: Negative.   Psychiatric/Behavioral: Negative.   All other systems reviewed and are negative.   PHYSICAL EXAM: VS:  BP 110/64 (BP Location: Left Arm, Patient Position: Sitting, Cuff Size: Normal)   Pulse 95   Ht 6' 1"  (1.854 m)   Wt 225 lb 8 oz (102.3 kg)   SpO2 95%   BMI 29.75 kg/m  , BMI Body mass index is 29.75 kg/m. Constitutional:  oriented to person, place, and time. No distress.  HENT:  Head: Grossly normal Eyes:  no discharge. No scleral icterus.  Neck: No JVD, no carotid bruits  Cardiovascular: Regular rate and rhythm, no murmurs appreciated Pulmonary/Chest: Clear to auscultation bilaterally, no wheezes or rails Abdominal: Soft.  no distension.  no tenderness.  Musculoskeletal: Normal range of motion Neurological:  normal muscle tone. Coordination normal. No atrophy Skin: Skin warm and dry Psychiatric: normal affect, pleasant   Recent Labs: 11/20/2019: ALT 13 12/25/2019: B Natriuretic Peptide 555.0; Magnesium 2.0 03/22/2020: BUN 23; Creatinine, Ser 1.39; Hemoglobin 12.3; Platelets 87; Potassium 4.0; Sodium 138    Lipid Panel No results found for: CHOL, HDL, LDLCALC, TRIG    Wt Readings from Last 3 Encounters:  05/13/20 225 lb 8 oz (102.3 kg)  05/06/20 229 lb 9.6 oz (104.1 kg)  03/29/20 215 lb 14.4 oz (97.9 kg)     ASSESSMENT AND PLAN:  Problem List Items Addressed This Visit      Cardiology Problems   Persistent atrial  fibrillation (HCC) - Primary   Essential hypertension   HFrEF (heart failure with reduced ejection fraction) (HCC)    Other Visit Diagnoses    NICM (nonischemic cardiomyopathy) (HCC)       NSVT (nonsustained ventricular tachycardia) (HCC)       Stage 3a chronic kidney disease (HCC)       Chronic systolic CHF (congestive heart failure) (HCC)         Nonischemic cardiomyopathy Prior alcohol,  Entresto held secondary to hypotension On very low-dose metoprolol succinate 12.5 daily Takes torsemide 20 three times a week  MRI no infiltrative disease, ejection fraction up to 30 to 40% Previously declined repeat echo  Near syncope/shaking episodes Significant prior alcohol history Has seen neurology, EEG   Completed Zio Previously declined midodrine  Atrial fibrillation, permanent  prior ablation, Did not hold, converted back to atrial fibrillation Seen by [facility] EP, Tolerating Eliquis, no falls though does have some gait instability  Chronic systolic CHF Taking torsemide , metoprolol BP too low for entresto   Total encounter time more than 35 minutes  Greater than 50% was spent in counseling and coordination of care with the patient   Signed, Esmond Plants, M.D., Ph.D. [place], [place]

## 2020-05-13 ENCOUNTER — Other Ambulatory Visit: Payer: Self-pay

## 2020-05-13 ENCOUNTER — Encounter: Payer: Self-pay | Admitting: Cardiovascular Disease

## 2020-05-13 ENCOUNTER — Ambulatory Visit: Payer: PPO | Admitting: Cardiovascular Disease

## 2020-05-13 VITALS — BP 110/64 | HR 95 | Ht 73.0 in | Wt 225.5 lb

## 2020-05-13 DIAGNOSIS — I5022 Chronic systolic (congestive) heart failure: Secondary | ICD-10-CM

## 2020-05-13 DIAGNOSIS — I5020 Unspecified systolic (congestive) heart failure: Secondary | ICD-10-CM | POA: Diagnosis not present

## 2020-05-13 DIAGNOSIS — I4729 Other ventricular tachycardia: Secondary | ICD-10-CM

## 2020-05-13 DIAGNOSIS — I428 Other cardiomyopathies: Secondary | ICD-10-CM | POA: Diagnosis not present

## 2020-05-13 DIAGNOSIS — I10 Essential (primary) hypertension: Secondary | ICD-10-CM | POA: Diagnosis not present

## 2020-05-13 DIAGNOSIS — N1831 Chronic kidney disease, stage 3a: Secondary | ICD-10-CM | POA: Diagnosis not present

## 2020-05-13 DIAGNOSIS — I4819 Other persistent atrial fibrillation: Secondary | ICD-10-CM

## 2020-05-13 DIAGNOSIS — I472 Ventricular tachycardia: Secondary | ICD-10-CM

## 2020-05-13 NOTE — Patient Instructions (Signed)
Medication Instructions:  No changes  If you need a refill on your cardiac medications before your next appointment, please call your pharmacy.    Lab work: No new labs needed   If you have labs (blood work) drawn today and your tests are completely normal, you will receive your results only by: . MyChart Message (if you have MyChart) OR . A paper copy in the mail If you have any lab test that is abnormal or we need to change your treatment, we will call you to review the results.   Testing/Procedures: No new testing needed   Follow-Up: At [facility], you and your health needs are our priority.  As part of our continuing mission to provide you with exceptional heart care, we have created designated Provider Care Teams.  These Care Teams include your primary Cardiologist (physician) and Advanced Practice Providers (APPs -  Physician Assistants and Nurse Practitioners) who all work together to provide you with the care you need, when you need it.  . You will need a follow up appointment in 6 months  . Providers on your designated Care Team:   . Christopher Berge, NP . Ryan Dunn, PA-C . Jacquelyn Visser, PA-C  Any Other Special Instructions Will Be Listed Below (If Applicable).  COVID-19 Vaccine Information can be found at: [email or web address] For questions related to vaccine distribution or appointments, please email [email or web address] or call [phone].     

## 2020-05-28 DIAGNOSIS — E119 Type 2 diabetes mellitus without complications: Secondary | ICD-10-CM | POA: Diagnosis not present

## 2020-05-28 DIAGNOSIS — E785 Hyperlipidemia, unspecified: Secondary | ICD-10-CM | POA: Diagnosis not present

## 2020-05-28 DIAGNOSIS — E039 Hypothyroidism, unspecified: Secondary | ICD-10-CM | POA: Diagnosis not present

## 2020-06-05 DIAGNOSIS — E119 Type 2 diabetes mellitus without complications: Secondary | ICD-10-CM | POA: Diagnosis not present

## 2020-06-05 DIAGNOSIS — I2510 Atherosclerotic heart disease of native coronary artery without angina pectoris: Secondary | ICD-10-CM | POA: Diagnosis not present

## 2020-06-05 DIAGNOSIS — E038 Other specified hypothyroidism: Secondary | ICD-10-CM | POA: Diagnosis not present

## 2020-06-05 DIAGNOSIS — E785 Hyperlipidemia, unspecified: Secondary | ICD-10-CM | POA: Diagnosis not present

## 2020-06-06 ENCOUNTER — Telehealth: Payer: Self-pay | Admitting: Cardiovascular Disease

## 2020-06-06 NOTE — Telephone Encounter (Signed)
Pt c/o swelling: STAT is pt has developed SOB within 24 hours  1) How much weight have you gained and in what time span?  Not sure   2) If swelling, where is the swelling located? Scrotum and legs   3) Are you currently taking a fluid pill? ? Lactulose   4) Are you currently SOB? No   5) Do you have a log of your daily weights (if so, list)? Yes 224 at home 233 at pcp office   6) Have you gained 3 pounds in a day or 5 pounds in a week?  no  7) Have you traveled recently? No but prolonged sitting per son patient feeble and unable to tolerate ambulating safely without assistance    Per son patient k+ 6.2 recently and unable to obtain lab draw due to dehydration  Unable to urinate   Patient son concerned about decline in activity and compounding medical issues.  He wants to ask about cardiac rehab and or an appt to evaluate.   Added to [facility] schedule on 5/23 at 320 .  Please call  Son to discuss concerns asap.

## 2020-06-06 NOTE — Telephone Encounter (Signed)
I spoke with the patient's son, [name] (ok per [facility]). He was calling concerned that the patient is becoming progressively lethargic/ unsteady on his feet and having scrotal swelling.  Per [name], he saw his PCP yesterday, but he was unable to go to the appointment. He spoke with the nurse for Dr. Hall Busing and they were just concerned about his balance.  Per [name], the patient recently had a BMP with Dr. Hall Busing on 05/28/20 and his potassium was 6.2.   He was prescribed lactulose. They stuck him 3 times yesterday trying to repeat his BMP, but without success and the patient refused any more sticks. He is due to go back to Dr. Juanell Fairly tomorrow for another redraw.  [name] advised he is not sure what the scrotal swelling is coming from. He is concerned about the patient's inactivity and advised a family member, who is a Marine scientist thought he might benefit from Cardiac Rehab.  I have confirmed with [name] that the scrotal swelling is not acutely new nor is the gait instability and his fatigue.  I have advised [name] that the patient has multiple health problems that may be contributing to how he his feeling and that he might not be a Cardiac Rehab candidate, but might benefit more from PT that could be ordered by his PCP.  I have also advised [name] that my biggest concern at this point is the patient having his BMP rechecked to insure his K+ is back to normal and that his kidneys are ok.  [name] confirms the patient is drinking water and weighing daily. His weight at home was 224 lbs yesterday morning and 233 lbs yesterday afternoon at Dr. Juanell Fairly office. [name] did not have any other home weights with him prior to yesterday.   They are wanting advisement from Dr. Rockey Situ on the patient's current health status. He is already scheduled for an appointment on Monday 06/10/20 at 3:20 pm with Dr. Rockey Situ.  I have advised if the patient is not acutely worse, then he should keep that appointment and [name] should come with him  if possible. However, if the patient becomes acutely worse with his swelling, breathing, or lethargy, they should have him evaluated in the ER.   [name] voices understanding of the above recommendations and is agreeable.  He is aware I will forward this message to Dr. Rockey Situ as an FYI prior to his appointment on Monday.

## 2020-06-07 DIAGNOSIS — E039 Hypothyroidism, unspecified: Secondary | ICD-10-CM | POA: Diagnosis not present

## 2020-06-07 DIAGNOSIS — E785 Hyperlipidemia, unspecified: Secondary | ICD-10-CM | POA: Diagnosis not present

## 2020-06-07 DIAGNOSIS — E119 Type 2 diabetes mellitus without complications: Secondary | ICD-10-CM | POA: Diagnosis not present

## 2020-06-10 ENCOUNTER — Ambulatory Visit: Payer: PPO | Admitting: Cardiovascular Disease

## 2020-06-10 ENCOUNTER — Encounter: Payer: Self-pay | Admitting: Cardiovascular Disease

## 2020-06-10 ENCOUNTER — Other Ambulatory Visit: Payer: Self-pay

## 2020-06-10 VITALS — BP 100/60 | HR 115 | Ht 73.0 in | Wt 234.0 lb

## 2020-06-10 DIAGNOSIS — K7682 Hepatic encephalopathy: Secondary | ICD-10-CM

## 2020-06-10 DIAGNOSIS — K7290 Hepatic failure, unspecified without coma: Secondary | ICD-10-CM

## 2020-06-10 DIAGNOSIS — N1832 Chronic kidney disease, stage 3b: Secondary | ICD-10-CM

## 2020-06-10 DIAGNOSIS — I5020 Unspecified systolic (congestive) heart failure: Secondary | ICD-10-CM | POA: Diagnosis not present

## 2020-06-10 DIAGNOSIS — I4819 Other persistent atrial fibrillation: Secondary | ICD-10-CM | POA: Diagnosis not present

## 2020-06-10 DIAGNOSIS — N178 Other acute kidney failure: Secondary | ICD-10-CM

## 2020-06-10 MED ORDER — TORSEMIDE 20 MG PO TABS: 20.0000 mg | ORAL_TABLET | Freq: Two times a day (BID) | ORAL | 3 refills | Status: DC

## 2020-06-10 NOTE — Progress Notes (Signed)
Cardiology Office Note  Date:  06/10/2020   ID:  Noland, Pizano 1950/01/24, MRN [id number]  PCP:  Albina Billet, MD   Chief Complaint  Patient presents with  . Leg Swelling    Patient c/o shortness of breath, LE edema, swelling in scrotum and increased potassium levels. Medications reviewed by the patient verbally.     HPI:  [name] is a 70 year old gentleman with past medical history of Nonischemic cardiomyopathy, EF 65% Chronic systolic CHF CKD Cirrhosis, NASH, etoh Atrial fibrillation, s/p DCCV 02/16/19 afib ablation at [facility] with TEE 05/2019 LHC 02/15/19 demonstrated 60% ostial D2 stenosis.  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 Hall Busing  Was told potassium elevated>6 Repeat 5.7  Missing lactulose doses, "too many doctor visits" Also missing torsemide doses Weight up 10 pounds past month Now with scrotal swelling, abdominal distention  Presents today in a wheelchair Does not get around much but is short of breath with ambulation High fluid intake Reports he takes torsemide 3 times a week, sometimes misses doses as he is busy  Prior abdominal ultrasound and CT scan showing no ascites On last clinic visit declined echocardiogram  TEE: 05/2019: The left ventricular systolic function is severely decreased.  The right ventricle is moderately dilated in size, with severely reduced  systolic function.   Labs reviewed CR 1.6, on prior clinic visit, most recent lab work through primary care creatinine 1.9  On lactulose QOD, will often miss doses per patient's son  EKG personally reviewed by myself on todays visit Shows atrial fibrillation ventricular rate 115 bpm no significant ST-T wave changes pvcs  Other past medical history reviewed Pushing the  cart at the store November 20, 2019 Dizzy, near syncope, episodes of shaking Was seen in the emergency room Noted hypotension CXR showed some vascular congestion and HS-troponin were 23 and 25, he was recommended to hold Entresto   ED 12/25/2019 due to "shaking" episode and near syncope.  workup notable for BNP 555, HS-troponin 29 ? 33. Stable renal function 1.61, GFR 46. Hb 12.9. CXR with cardiomegaly and improved pulmonary vascular congestion  Seen by neurology EEG pending  zio monitor, reviewed with him in detail Atrial Fibrillation occurred continuously (100% burden), ranging from 35-187 bpm (avg of 66 bpm).  5 Ventricular Tachycardia runs occurred, the run with the fastest interval lasting 4 beats with a max rate of 156 bpm, the longest lasting 15 beats with an avg rate of 124 bpm.   Difficulty discerning atrial activity making definitive diagnosis difficult to ascertain.  Idioventricular Rhythm was present.  Isolated VEs were occasional (3.6%, [id number]), VE Couplets were rare (<1.0%, 15), and no VE Triplets were present. Ventricular Bigeminy and Trigeminy were present.  Lab work reviewed CR 1.6.  Taking torsemide 4 times a week  In the hospital 04/04/19, with fever 101  left lower extremity  cellulitis. ,treated with keflex elevated ammonia of 82, likely hepatic encephalopathy.     History of EtOH, NASH At d/c , diuretics were held, Parkline held (presumably for elevated creatinine)  Cardiac MRI  no infiltrative disease, ejection fraction up to 30 to 40%  05/02/2019: seen by Dr. Caryl Comes EP: Recommendation to proceed with A. fib ablation   PMH:   has a past medical history of (HFpEF) heart failure with preserved ejection  fraction ([place]), CHF (congestive heart failure) ([place]), CKD (chronic kidney disease), stage III ([place]), Coumadin resistance ([place]), Diabetes mellitus without complication ([place]), Dysrhythmia, Fatty liver, H/O ETOH abuse, Hepatic encephalopathy ([place]), adenomatous polyp  of colon, thrombocytopenia, Hypertension, Hypothyroidism, Macrocytic anemia, Mixed hyperlipidemia, NICM (nonischemic cardiomyopathy) ([place]), Nonobstructive CAD (coronary artery disease), Persistent atrial fibrillation ([place]), and Valvular heart disease.  PSH:    Past Surgical History:  Procedure Laterality Date  . CARDIAC CATHETERIZATION     no stents  . COLONOSCOPY    . COLONOSCOPY WITH PROPOFOL N/A 07/26/2015   Procedure: COLONOSCOPY WITH PROPOFOL;  Surgeon: Manya Silvas, MD;  Location: [facility] ENDOSCOPY;  Service: Endoscopy;  Laterality: N/A;  . COLONOSCOPY WITH PROPOFOL N/A 10/03/2019   Procedure: COLONOSCOPY WITH PROPOFOL;  Surgeon: Lesly Rubenstein, MD;  Location: [facility] ENDOSCOPY;  Service: Endoscopy;  Laterality: N/A;  . ESOPHAGOGASTRODUODENOSCOPY (EGD) WITH PROPOFOL N/A 10/03/2019   Procedure: ESOPHAGOGASTRODUODENOSCOPY (EGD) WITH PROPOFOL;  Surgeon: Lesly Rubenstein, MD;  Location: [facility] ENDOSCOPY;  Service: Endoscopy;  Laterality: N/A;  . HAMMER TOE SURGERY      Current Outpatient Medications  Medication Sig Dispense Refill  . apixaban (ELIQUIS) 5 MG TABS tablet Take by mouth 2 (two) times daily.     [name] atorvastatin (LIPITOR) 10 MG tablet Take 10 mg by mouth daily.    . clobetasol cream (TEMOVATE) 8.11 % Apply 1 application topically 2 (two) times daily.    . colchicine 0.6 MG tablet Take 0.6 mg by mouth 2 (two) times daily.     [name] gabapentin (NEURONTIN) 600 MG tablet Take 600 mg by mouth 3 (three) times daily.    [name] lactulose (CHRONULAC) 10 GM/15ML solution Take 10 g by mouth every other day.    . levothyroxine (SYNTHROID, LEVOTHROID) 100 MCG tablet Take 100 mcg by mouth every morning.  4  . metoprolol succinate (TOPROL-XL) 25 MG 24 hr tablet Take 0.5 tablets (12.5 mg total) by mouth in the morning and at bedtime. 90 tablet 3  . mupirocin ointment (BACTROBAN) 2 % Apply 1 application topically daily. To any open sores or crusted areas of legs 22 g 1  . pantoprazole (PROTONIX) 20 MG  tablet Take 40 mg by mouth daily.     [name] torsemide (DEMADEX) 20 MG tablet Take 1 tablet (20 mg total) by mouth 2 (two) times daily. Hold on Monday/Wednesdy/Friday 180 tablet 3   No current facility-administered medications for this visit.    Allergies:   Penicillins   Social History:  The patient  reports that he has quit smoking. His smokeless tobacco use includes snuff. He reports that he does not drink alcohol and does not use drugs.   Family History:   family history includes Heart Problems in his father, mother, and sister.   Review of Systems: Review of Systems  Constitutional: Negative.   HENT: Negative.   Respiratory: Positive for shortness of breath.   Cardiovascular: Positive for leg swelling.  Gastrointestinal: Negative.   Genitourinary:       Scrotal swelling  Musculoskeletal: Negative.   Neurological: Negative.   Psychiatric/Behavioral: Negative.   All other systems reviewed and are negative.   PHYSICAL EXAM: VS:  BP 100/60 (BP Location: Left Arm, Patient Position: Sitting, Cuff Size: Normal)   Pulse (!) 115   Ht 6' 1"  (1.854 m)   Wt 234 lb (106.1 kg)   BMI 30.87 kg/m  , BMI Body mass index is 30.87 kg/m. Constitutional:  oriented to person, place, and time. No distress.  HENT:  Head: Grossly normal Eyes:  no discharge. No scleral icterus.  Neck:  JVD 10+, no carotid bruits  Cardiovascular: Regular rate and rhythm, no murmurs appreciated 1-2+ lower extremity edema Pulmonary/Chest: Clear to auscultation bilaterally, no wheezes or rails Abdominal: Firm, significant distension.  no tenderness.  Musculoskeletal: Normal range of motion Neurological:  normal muscle tone. Coordination normal. No atrophy Skin: Skin warm and dry Psychiatric: normal affect, pleasant   Recent Labs: 11/20/2019: ALT 13 12/25/2019: B Natriuretic Peptide 555.0; Magnesium 2.0 03/22/2020: BUN 23; Creatinine, Ser 1.39; Hemoglobin 12.3; Platelets 87; Potassium 4.0; Sodium 138    Lipid  Panel No results found for: CHOL, HDL, LDLCALC, TRIG    Wt Readings from Last 3 Encounters:  06/10/20 234 lb (106.1 kg)  05/13/20 225 lb 8 oz (102.3 kg)  05/06/20 229 lb 9.6 oz (104.1 kg)     ASSESSMENT AND PLAN:  Problem List Items Addressed This Visit      Cardiology Problems   Persistent atrial fibrillation (HCC)   Relevant Medications   torsemide (DEMADEX) 20 MG tablet   Other Relevant Orders   EKG 12-Lead   HFrEF (heart failure with reduced ejection fraction) (HCC) - Primary   Relevant Medications   torsemide (DEMADEX) 20 MG tablet   Other Relevant Orders   EKG 12-Lead   ECHOCARDIOGRAM COMPLETE     Other   Acute renal failure superimposed on chronic kidney disease (HCC)   Hepatic encephalopathy (HCC)     Nonischemic cardiomyopathy History of alcohol use Low blood pressure limiting advancement of his cardiac medications -Some medication noncompliance especially with his lactulose, torsemide -9 pound weight gain since last clinic visit now with worsening abdominal distention and scrotal swelling -Explained to patient and his son he needs to stay on torsemide, recommend he take torsemide 20 twice daily until scrotal swelling improves, abdomen less distended, We will plan to see him back in a week or 2 for readjustment of medications, lab work to check renal function -Previously declined echo, order placed for echocardiogram this visit We have explained to patient and son he has underlying cardiac disease, renal failure, liver failure/cirrhosis making his management difficult  Near syncope/shaking episodes Significant prior alcohol history Has seen neurology, EEG   Previously declined midodrine  Atrial fibrillation, permanent  prior ablation, Did not hold, converted back to atrial fibrillation Seen by [facility] EP, Recommend he be compliant with his Eliquis Not a candidate for digoxin Recommend to be compliant with his metoprolol  Chronic systolic CHF Given weight  gain, abdominal distention, scrotal edema, worsening shortness of breath We recommend he increase torsemide up to 20 twice daily for the next week or so until he can be seen in clinic in follow-up Unable to advance his cardiac medications given systolic pressure of 546 Symptoms likely exacerbated by atrial fibrillation with elevated rate  Cirrhosis/encephalopathy Recommend he stay compliant with his lactulose, often missing doses per his son  Outlook is poor given multiorgan system failure renal failure, cardiac failure, liver failure  Total encounter time more than 35 minutes  Greater than 50% was spent in counseling and coordination of care with the patient   Signed, Esmond Plants, M.D., Ph.D. [place], [place]

## 2020-06-10 NOTE — Patient Instructions (Addendum)
Medication Instructions:  Torsemide 20 mg twice a day  STOP  Potassium  Hold until further notice   If you need a refill on your cardiac medications before your next appointment, please call your pharmacy.    Lab work: No new labs needed  Testing/Procedures: Echo for CHF (Congested Heart Failure)  Your physician has requested that you have an echocardiogram. Echocardiography is a painless test that uses sound waves to create images of your heart. It provides your doctor with information about the size and shape of your heart and how well your heart's chambers and valves are working. This procedure takes approximately one hour. There are no restrictions for this procedure.  There is a possibility that an IV may need to be started during your test to inject an image enhancing agent. This is done to obtain more optimal pictures of your heart. Therefore we ask that you do at least drink some water prior to coming in to hydrate your veins.   Follow-Up:  You will need a follow up appointment in 1-2 weeks   . Providers on your designated Care Team:   . Murray Hodgkins, NP . Christell Faith, PA-C . Marrianne Mood, PA-C  COVID-19 Vaccine Information can be found at: ShippingScam.co.uk For questions related to vaccine distribution or appointments, please email vaccine@[email or web address] .com or call [phone].

## 2020-06-12 DIAGNOSIS — D472 Monoclonal gammopathy: Secondary | ICD-10-CM | POA: Diagnosis not present

## 2020-06-12 DIAGNOSIS — D696 Thrombocytopenia, unspecified: Secondary | ICD-10-CM | POA: Diagnosis not present

## 2020-06-12 DIAGNOSIS — I428 Other cardiomyopathies: Secondary | ICD-10-CM | POA: Diagnosis not present

## 2020-06-12 DIAGNOSIS — I864 Gastric varices: Secondary | ICD-10-CM | POA: Diagnosis not present

## 2020-06-12 DIAGNOSIS — I5022 Chronic systolic (congestive) heart failure: Secondary | ICD-10-CM | POA: Diagnosis not present

## 2020-06-12 DIAGNOSIS — R933 Abnormal findings on diagnostic imaging of other parts of digestive tract: Secondary | ICD-10-CM | POA: Diagnosis not present

## 2020-06-12 DIAGNOSIS — I480 Paroxysmal atrial fibrillation: Secondary | ICD-10-CM | POA: Diagnosis not present

## 2020-06-12 DIAGNOSIS — K7290 Hepatic failure, unspecified without coma: Secondary | ICD-10-CM | POA: Diagnosis not present

## 2020-06-12 DIAGNOSIS — K7031 Alcoholic cirrhosis of liver with ascites: Secondary | ICD-10-CM | POA: Diagnosis not present

## 2020-06-13 ENCOUNTER — Other Ambulatory Visit: Payer: Self-pay | Admitting: Gastroenterology

## 2020-06-13 DIAGNOSIS — K7031 Alcoholic cirrhosis of liver with ascites: Secondary | ICD-10-CM

## 2020-06-14 ENCOUNTER — Ambulatory Visit
Admission: RE | Admit: 2020-06-14 | Discharge: 2020-06-14 | Disposition: A | Discharge status: Home / Self Care | Payer: PPO | Source: Ambulatory Visit | Attending: Gastroenterology | Admitting: Gastroenterology

## 2020-06-14 ENCOUNTER — Other Ambulatory Visit: Payer: Self-pay

## 2020-06-14 DIAGNOSIS — R188 Other ascites: Secondary | ICD-10-CM | POA: Diagnosis not present

## 2020-06-14 DIAGNOSIS — K7031 Alcoholic cirrhosis of liver with ascites: Secondary | ICD-10-CM | POA: Diagnosis not present

## 2020-06-14 LAB — ALBUMIN, PLEURAL OR PERITONEAL FLUID: Albumin, Fluid: 1.8 g/dL

## 2020-06-14 LAB — BODY FLUID CELL COUNT WITH DIFFERENTIAL
Eos, Fluid: 0 %
Lymphs, Fluid: 18 %
Monocyte-Macrophage-Serous Fluid: 66 %
Neutrophil Count, Fluid: 16 %
Total Nucleated Cell Count, Fluid: 227 cu mm

## 2020-06-14 LAB — PROTEIN, PLEURAL OR PERITONEAL FLUID: Total protein, fluid: 3.7 g/dL

## 2020-06-14 NOTE — Procedures (Signed)
PROCEDURE SUMMARY:  Successful US guided paracentesis from right lateral abdomen.  Yielded 4.2 L of amber fluid.  No immediate complications.  Pt tolerated well.   Specimen was sent for labs.  EBL < 71mL  Docia Barrier PA-C 06/14/2020 10:24 AM

## 2020-06-18 ENCOUNTER — Ambulatory Visit (INDEPENDENT_AMBULATORY_CARE_PROVIDER_SITE_OTHER): Payer: PPO

## 2020-06-18 ENCOUNTER — Other Ambulatory Visit: Payer: Self-pay

## 2020-06-18 DIAGNOSIS — I5020 Unspecified systolic (congestive) heart failure: Secondary | ICD-10-CM | POA: Diagnosis not present

## 2020-06-18 LAB — ECHOCARDIOGRAM COMPLETE
AR max vel: 2.76 cm2
AV Area VTI: 2.18 cm2
AV Area mean vel: 2.28 cm2
AV Mean grad: 3 mmHg
AV Peak grad: 5.4 mmHg
Ao pk vel: 1.16 m/s
Calc EF: 29.9 %
P 1/2 time: 614 msec
S' Lateral: 6.6 cm
Single Plane A2C EF: 28.2 %
Single Plane A4C EF: 34.3 %

## 2020-06-18 LAB — BODY FLUID CULTURE W GRAM STAIN: Culture: NO GROWTH

## 2020-06-18 LAB — CYTOLOGY - NON PAP

## 2020-06-19 ENCOUNTER — Encounter: Payer: Self-pay | Admitting: Physician Assistant

## 2020-06-19 ENCOUNTER — Ambulatory Visit: Payer: PPO | Admitting: Physician Assistant

## 2020-06-19 VITALS — BP 100/60 | HR 96 | Ht 73.0 in | Wt 214.0 lb

## 2020-06-19 DIAGNOSIS — I4821 Permanent atrial fibrillation: Secondary | ICD-10-CM

## 2020-06-19 DIAGNOSIS — N189 Chronic kidney disease, unspecified: Secondary | ICD-10-CM

## 2020-06-19 DIAGNOSIS — I25118 Atherosclerotic heart disease of native coronary artery with other forms of angina pectoris: Secondary | ICD-10-CM

## 2020-06-19 DIAGNOSIS — I428 Other cardiomyopathies: Secondary | ICD-10-CM | POA: Diagnosis not present

## 2020-06-19 DIAGNOSIS — I071 Rheumatic tricuspid insufficiency: Secondary | ICD-10-CM

## 2020-06-19 DIAGNOSIS — I10 Essential (primary) hypertension: Secondary | ICD-10-CM | POA: Diagnosis not present

## 2020-06-19 DIAGNOSIS — Z7901 Long term (current) use of anticoagulants: Secondary | ICD-10-CM | POA: Diagnosis not present

## 2020-06-19 DIAGNOSIS — I472 Ventricular tachycardia: Secondary | ICD-10-CM | POA: Diagnosis not present

## 2020-06-19 DIAGNOSIS — D472 Monoclonal gammopathy: Secondary | ICD-10-CM

## 2020-06-19 DIAGNOSIS — R55 Syncope and collapse: Secondary | ICD-10-CM

## 2020-06-19 DIAGNOSIS — Z9889 Other specified postprocedural states: Secondary | ICD-10-CM

## 2020-06-19 DIAGNOSIS — E785 Hyperlipidemia, unspecified: Secondary | ICD-10-CM | POA: Diagnosis not present

## 2020-06-19 DIAGNOSIS — I5023 Acute on chronic systolic (congestive) heart failure: Secondary | ICD-10-CM | POA: Diagnosis not present

## 2020-06-19 DIAGNOSIS — I340 Nonrheumatic mitral (valve) insufficiency: Secondary | ICD-10-CM

## 2020-06-19 DIAGNOSIS — Z79899 Other long term (current) drug therapy: Secondary | ICD-10-CM

## 2020-06-19 DIAGNOSIS — I4729 Other ventricular tachycardia: Secondary | ICD-10-CM

## 2020-06-19 DIAGNOSIS — I5020 Unspecified systolic (congestive) heart failure: Secondary | ICD-10-CM

## 2020-06-19 DIAGNOSIS — N170 Acute kidney failure with tubular necrosis: Secondary | ICD-10-CM

## 2020-06-19 DIAGNOSIS — I493 Ventricular premature depolarization: Secondary | ICD-10-CM | POA: Diagnosis not present

## 2020-06-19 DIAGNOSIS — N1831 Chronic kidney disease, stage 3a: Secondary | ICD-10-CM | POA: Diagnosis not present

## 2020-06-19 DIAGNOSIS — I872 Venous insufficiency (chronic) (peripheral): Secondary | ICD-10-CM

## 2020-06-19 DIAGNOSIS — C9000 Multiple myeloma not having achieved remission: Secondary | ICD-10-CM

## 2020-06-19 NOTE — Patient Instructions (Addendum)
Medication Instructions:  Please continue your current medications Please continue your Torsemide 20 mgTWO times a day  *If you need a refill on your cardiac medications before your next appointment, please call your pharmacy*   Lab Work: None  Testing/Procedures: None  Follow-Up: At [facility], you and your health needs are our priority.  As part of our continuing mission to provide you with exceptional heart care, we have created designated Provider Care Teams.  These Care Teams include your primary Cardiologist (physician) and Advanced Practice Providers (APPs -  Physician Assistants and Nurse Practitioners) who all work together to provide you with the care you need, when you need it.  Your next appointment:   2 week(s)  The format for your next appointment:   In Person  Provider:   Ida Rogue, MD or Marrianne Mood, PA-C   Other Instructions Please have [name] call our office at [phone] to have an official form complete for your pre-opt clearance  Nephrology with [facility] will reach out to arrange your first appt, referral was placed in the system.

## 2020-06-19 NOTE — Progress Notes (Signed)
Afib   [name] Visit    Patient Name: [name] Date of Encounter: 06/19/2020  Primary Care Provider:  Albina Billet, MD Primary Cardiologist:  [name] Rogue, MD  Chief Complaint    Chief Complaint  Patient presents with  . Other    Follow post ECHO. Meds reviewed verbally with patient.    70 year old male with history of NICM, CAD s/p LHC 02/15/2019, chronic HFrEF, Afib s/p DCCV 02/16/2019, hypothyroidism, CKD, cirrhosis/NASH, GERD, pre-DM2, MGUS, h/o colonic adenomas , h/o esophageal dysphagia, past history of alcohol and tobacco use, leukocytoclastic vasculitis, and who presents today for follow-up after echo.  Past Medical History    Past Medical History:  Diagnosis Date  . (HFpEF) heart failure with preserved ejection fraction ([place])    a. 03/2019 cMRI: EF 30-40%, glob HK; b. 05/2019 Echo [facility]): EF 35%.  . CHF (congestive heart failure) ([place])   . CKD (chronic kidney disease), stage III ([place])   . Coumadin resistance ([place])   . Diabetes mellitus without complication ([place])   . Dysrhythmia    A. Fib  . Fatty liver   . H/O ETOH abuse   . Hepatic encephalopathy ([place])    history  . Hx of adenomatous polyp of colon   . Hx of thrombocytopenia   . Hypertension   . Hypothyroidism   . Macrocytic anemia   . Mixed hyperlipidemia   . NICM (nonischemic cardiomyopathy) ([place])    a. 03/2019 cMRI: EF 30-40%, glob HK. No LGE. No evidence of infiltrative cardiomyopathy; b. 05/2019 Echo [facility]): EF 35%, mild to mod MR, sev dil LA, sev dil RV w/ mod reduced RV fxn. Sev TR. Mild-mod PAH (PASP 36mHg), sev dil RA.  .Marland KitchenNonobstructive CAD (coronary artery disease)    a. 02/2019 Cath ([facility]: LM nl, LAD 472mD1 small, D2 60ost, D3 small, LCX nl, OM2 25, RCA 4074m5d.  . PMarland Kitchenrsistent atrial fibrillation (HCCSpivey  a. CHA2DS2VASc = 5-->eliquis; b. 05/2019 s[facility]p RF[facility] . Valvular heart disease    a. [facility]2019 [facility], sev TR.   Past Surgical History:  Procedure Laterality Date  .  CARDIAC CATHETERIZATION     no stents  . COLONOSCOPY    . COLONOSCOPY WITH PROPOFOL N/A 07/26/2015   Procedure: COLONOSCOPY WITH PROPOFOL;  Surgeon: RobManya SilvasD;  [facility];  Service: Endoscopy;  Laterality: N/A;  . COLONOSCOPY WITH PROPOFOL N/A 10/03/2019   Procedure: COLONOSCOPY WITH PROPOFOL;  Surgeon: LocLesly RubensteinD;  Location: [facility] ENDOSCOPY;  Service: Endoscopy;  Laterality: N/A;  . ESOPHAGOGASTRODUODENOSCOPY (EGD) WITH PROPOFOL N/A 10/03/2019   Procedure: ESOPHAGOGASTRODUODENOSCOPY (EGD) WITH PROPOFOL;  Surgeon: LocLesly RubensteinD;  Location: [facility] ENDOSCOPY;  Service: Endoscopy;  Laterality: N/A;  . HAMMER TOE SURGERY      Allergies  Allergies  Allergen Reactions  . Penicillins Hives    History of Present Illness    [name] a 70 18o. male with PMH as above.  [name] with all records reviewed by myself prior to today's appointment.  [facility] with Dr. GolRockey [name]  [name] and volume up with increased furosemide resulting in minimal increase in urine output. 05/05/2018 echo showed LVEF 35 to 40% with global hypocontractility, severe BiAE, moderate RVE, mild AR/PR, moderate MR/TR and moderate PHTN.    He was  admitted to [name] 01/2019 with b/l LEE, tremor, and AMS, overload, and decompensated heart failure. Echo showed worsening EF 25 to 30% and moderate MR with reduced RVSF and severe TR.   He was IV diuresed and -13L and near 30lbs during his [name] stay.   Given he had never had a prior ischemic evaluation, he underwent PET CT with multiple reversible defects.   He underwent LHC 1/21 that demonstrated nonobstructive dz and pulmonary HTN. 60% ostial D2 stenosis noted.  It was noted that the etiology for CM remained unclear, as LHC did not identify enough CAD to account for his reduced LV function.     He was started on Entresto and continued on metoprolol. Given his history of MGUS, amyloid was noted to remain a consideration, though it was overall favored to be less likely.    He underwent cardiac MRI without evidence of infiltrative cardiomyopathy. Both ICD and ablation were discussed during his admission. Given his recent cath, it was suspected that his reduced EF was likely 2/2 previous heavy alcohol use and Afib / NSVT/ tachycardia mediated CM.   He underwent 02/16/19 cardioversion with restoration of NSR. Digoxin was stopped 2/2 bradycardia / hypotension, as following cardioversion, he initially had junctional bradycardia with slow return of sinus node function. He was started on Eliquis at discharge.  He was discharged with a Life-vest given NSVT during admission and referred to [name] as an outpatient for consideration of ICD.   Escalation of HF tx / GDMT has been limited by hypotension since that time. Toprol decreased to 12.75m BID to allow for room in BP. EDelene Lollhas been stopped and restarted in the past, currently held. Seen 12/2019 with repeat echo, carotid, and cardiac monitoring performed. Seen in the [name] 03/2019 with vague complaints of orthostasis. He had very high intake with weight trending upwards and recommendation to take an additional 419mof lasix after lunch for ankle edema. He was instructed to call the [name] if home weight went above 220lbs.   [facility]19, [facility] with hepatic encephalopathy, AOCKD, and left lower extremity cellulitis.    When seen 11/2019, multiple studies ordered as below: --12/2019 carotids with minimal to moderate bilateral atherosclerotic plaque R>L but not resulting in hemodynamically significant stenosis within either internal carotid. --01/2020 cardiac monitoring with 100% burden Afib and 5 runs of VT, as well as idioventricular rhythm and ectopy.  --05/2020 (delay in obtaining) Echo EF severely reduced at 25-30%, LV  global hypokinesis, severe LV dilation, indeterminate LV diastolic parameters, faint echos at RV apex and thought to represent artifact and/or prominent moderator band though focal wall thickening, possible thrombus, or possible mass unable to be excluded, RVSF moderately reduced with RV severely enlarged and severely elevated PASP, severe LAE and RAE, degenerative MV with moderate to severe MR, severe TR, mild AR, RAP 1530m.  Last seen in clinic 06/10/20 with recommendation for compliance with medication. He noted scrotal edema and worsening SOB with wt gain and abdominal distention. Torsemide increased to 81m52mD for 1 week or until seen at clinic. GDMT unable to be escalated due to SBP 100. It was thought sx exacerbated by Afib with elevated ventricular rate.   Today, 06/20/20, he denies any chest pain but does report racing heart rate at times. He reports tachypalpitations every time he walks. He reports feeling shaky and weak, especially when walking. He does not like to take his lasix, as it makes him have to urinate frequently. He  feels increasingly weak when he has to use the restroom more often. He dislikes both his fluid pill and lactulose with recommendation for compliance and HF education discussed today. He also notes continued orthostatic/dizziness; however, no recent syncope or falls.  He does note unstable gait.  Relatively sedentary, reportedly only ambulating to the restroom and kitchen.  Increased activity with exercise bands discussed. He denies any orthopnea but does note bilateral lower extremity edema.  No PND or early satiety.  No palpitations.  He denies any signs or symptoms consistent with bleeding and does report compliance with Eliquis. No EtOH use. He reports compliance with [place] and no s/sx of bleeding today. He wonders about his upcoming procedure at [facility] with report that we have not yet received a request to our preop pool.  Case discussed with primary cardiologist same  day.  Recent BP, HR, wt reviewed today. Log was brought in today by the pt. HR 68-11bpm SBP 100-124 DBP 60-90 Wt 205-218 Wt has been decreasing over the last week.  Home Medications    Prior to Admission medications   Medication Sig Start Date End Date Taking? Authorizing Provider  apixaban (ELIQUIS) 5 MG TABS tablet Take by mouth 2 (two) times daily.  02/18/19   [provider]  atorvastatin (LIPITOR) 10 MG tablet Take 10 mg by mouth daily.    [provider]  cephALEXin (KEFLEX) 500 MG capsule Take 1 capsule (500 mg total) by mouth every 6 (six) hours for 9 days. 04/07/19 04/16/19  Nolberto Hanlon, MD  clobetasol cream (TEMOVATE) 4.27 % Apply 1 application topically 2 (two) times daily.    [provider]  gabapentin (NEURONTIN) 100 MG capsule Take 100 mg by mouth 4 (four) times daily.    [provider]  lactobacillus acidophilus (BACID) TABS tablet Take 2 tablets by mouth 3 (three) times daily. 04/07/19   Nolberto Hanlon, MD  lactulose (CHRONULAC) 10 GM/15ML solution Take 45 mLs (30 g total) by mouth 2 (two) times daily. 04/07/19 05/07/19  Nolberto Hanlon, MD  levothyroxine (SYNTHROID, LEVOTHROID) 100 MCG tablet Take 100 mcg by mouth every morning. 10/29/17   [provider]  magnesium oxide (MAG-OX) 400 MG tablet Take 2 tablets by mouth 2 (two) times daily.  02/18/19 02/18/20  [provider]  metoprolol succinate (TOPROL-XL) 25 MG 24 hr tablet 25 mg daily.  02/10/16   [provider]  mupirocin ointment (BACTROBAN) 2 % Place 1 application into the nose 2 (two) times daily. 04/07/19   Nolberto Hanlon, MD  pantoprazole (PROTONIX) 20 MG tablet Take 20 mg by mouth daily.  02/02/19   [provider]    Review of Systems    He denies chest pain. He reports occasional palpitations, especially with walking. He reports dyspnea with walking. He denies pnd, orthopnea, n, v, syncope,, or early satiety. He reports wt loss since his last visit.  He reports continued orthostatic hypotension/dizziness, bilateral lower extremity edema, weakness, and fatigue.   all other systems reviewed and are otherwise negative except as noted above.  Physical Exam    VS:  BP 100/60 (BP Location: Right Arm, Patient Position: Sitting, Cuff Size: Normal)   Pulse 96   Ht _0  (1.854 m)   Wt 214 lb (97.1 kg)   SpO2 97%   BMI 28.23 kg/m  , BMI Body mass index is 28.23 kg/m. GEN: Frail and elderly male, in no acute distress. Seated in wheelchair.  HEENT: Accessory muscle use noted. Neck: Supple, no carotid  bruits. Accessory muscle use noted. Cardiac: IRIR, 2/6 systolic murmur, 1/6 diastolic murmur, No rubs, or gallops. No clubbing, cyanosis, bilateral 1+ lower extremity edema and LLE edema  >RLEE.  Radials/DP/PT 1+ and equal bilaterally.   Respiratory:  reduced breath sounds bilaterally, accessory muscle use noted. GI:  nontender, nondistended, BS + x 4. MS: no deformity or atrophy. Skin: warm and dry, no rash. Neuro:  Strength and sensation are intact. Psych: Normal affect.  Accessory Clinical Findings    ECG personally reviewed by me today -Afib with PVCs, ventricular rate 96bpm, RBBB, LVH with repolarization abnormalities, ongoing TWI in precordial leads V1-V6 as seen on prior tracings and in lateral leads I, avL- no acute changes from previous.  VITALS Reviewed today   Temp Readings from Last 3 Encounters:  03/29/20 (!) 96.1 F (35.6 C) (Tympanic)  12/25/19 98.5 F (36.9 C)  12/01/19 (!) 96 F (35.6 C) (Tympanic)   BP Readings from Last 3 Encounters:  06/19/20 100/60  06/14/20 102/72  06/10/20 100/60   Pulse Readings from Last 3 Encounters:  06/19/20 96  06/14/20 93  06/10/20 (!) 115    Wt Readings from Last 3 Encounters:  06/19/20 214 lb (97.1 kg)  06/10/20 234 lb (106.1 kg)  05/13/20 225 lb 8 oz (102.3 kg)     LABS  reviewed today   CareEverwhere Labs present? Yes/No: Yes but [facility] most recent  Lab Results  Component  Value Date   WBC 4.2 03/22/2020   HGB 12.3 (L) 03/22/2020   HCT 38.0 (L) 03/22/2020   MCV 99.2 03/22/2020   PLT 87 (L) 03/22/2020   Lab Results  Component Value Date   CREATININE 1.39 (H) 03/22/2020   BUN 23 03/22/2020   NA 138 03/22/2020   K 4.0 03/22/2020   CL 105 03/22/2020   CO2 22 03/22/2020   Lab Results  Component Value Date   ALT 13 11/20/2019   AST 18 11/20/2019   ALKPHOS 127 (H) 11/20/2019   BILITOT 0.9 11/20/2019   No results found for: CHOL, HDL, LDLCALC, LDLDIRECT, TRIG, CHOLHDL  Lab Results  Component Value Date   HGBA1C 5.9 (H) 03/09/2017   Lab Results  Component Value Date   TSH 2.342 04/05/2019     STUDIES/PROCEDURES reviewed today   Echo 06/18/20 1. Left ventricular ejection fraction, by estimation, is 25 to 30%. The  left ventricle has severely decreased function. The left ventricle  demonstrates global hypokinesis. The left ventricular internal cavity size  was severely dilated. Left ventricular  diastolic parameters are indeterminate.  2. Faint echos are noted near the RV apex, most likely representing  artifact and/or prominent moderator band. However, focal wall thickening,  thrombus, or mass cannot be entirely excluded. Right ventricular systolic  function is moderately reduced. The  right ventricular size is severely enlarged. There is severely elevated  pulmonary artery systolic pressure.  3. Left atrial size was severely dilated.  4. Right atrial size was severely dilated.  5. The mitral valve is degenerative. Moderate to severe mitral valve  regurgitation.  6. Tricuspid valve regurgitation is severe.  7. The aortic valve is tricuspid. Aortic valve regurgitation is mild. No  aortic stenosis is present.  8. The inferior vena cava is dilated in size with <50% respiratory  variability, suggesting right atrial pressure of 15 mmHg  Cardiac monitoring 01/2020 Atrial Fibrillation occurred continuously (100% burden), ranging from  35-187 bpm (avg of 66 bpm).  5 Ventricular Tachycardia runs occurred, the run with the fastest interval  lasting 4 beats with a max rate of 156 bpm, the longest lasting 15 beats with an avg rate of 124 bpm.  Difficulty discerning atrial activity making definitive diagnosis difficult to ascertain. Idioventricular Rhythm was present.  Isolated VEs were occasional (3.6%, [id number]), VE Couplets were rare (<1.0%, 15), and no VE Triplets were present. Ventricular Bigeminy and Trigeminy were present.  US Carotid  12/2019 IMPRESSION: Minimal to moderate amount of bilateral atherosclerotic plaque, right subjectively greater than left, not resulting in a hemodynamically significant stenosis within either internal carotid artery.   Cardiac MRI 04/03/19 IMPRESSION: No CMR evidence of infiltrative cardiomyopathy. No myocardial scarring or LGE. Hypokinetic left ventricle with visually estimated ejection fraction between 30-40%.  Cath / PCI: LHC/RHC 02/15/19 1. Non-flow-limiting coronary artery disease including a 60% ostial D2 stenosis. 2. Mildly elevated left ventricular filling pressures (LVEDP = 13 mm Hg). 3. Elevated right heart filling pressures; RA 11, RV 65/8, PA 61/26 (37), PCW 12, PVR 3.8 4. Moderate pulmonary hypertension. 5. Preserved cardiac output and index; FCO/CI 6.5/2.9, TDCO/CI 5.3/2.4  [name] Procedures and Devices:  Successful DCCV 02/16/19  TEE 02/15/18 1. There is a no thrombus seen in the left atrium. 2. There is a no thrombus seen in the right atrium. 3. There is moderate posteriorly directed mitral regurgitation. 4. There is mild aortic regurgitation.  TTE 02/10/18 1. The left ventricle is mildly dilated in size with normal wall thickness. 2. The left ventricular systolic function is severely decreased, LVEF is visually estimated at 25-30%. 3. The mitral valve leaflets are mildly thickened with normal leaflet mobility. 4. There is moderate mitral valve regurgitation. 5.  The aortic valve is trileaflet with mildly thickened leaflets with normal excursion. 6. There is mild aortic regurgitation. 7. The left atrium is moderately dilated in size. 8. The right ventricle is severely dilated in size, with reduced systolic function. 9. There is severe tricuspid regurgitation. 10. There is severe pulmonary hypertension, estimated pulmonary artery systolic pressure is 77 mmHg. 11. The right atrium is severely dilated in size. 12. IVC size and inspiratory change suggest elevated right atrial pressure. (10-20 mmHg).  PET Stress 02/13/18 - Abnormal myocardial perfusion study - There is a small in size, mild in severity, nearly completely reversible defect involving the apical and apical anterior segments. This is consistent with scar with peri-infarct ischemia. - There is a small in size, mild in severity, partially reversible defect involving the basal inferolateral and basal inferolateral segments. This is consistent with scar and liminal peri-infarct ischemia. Cannot rule out artifact. - During stress: Global systolic function is severely reduced. The ejection fraction calculated at 26%. The left ventricle is dilated. Right ventricular chamber size is severely dilated. Right ventricular systolic function is reduced. - 3 vessel coronary calcifications are noted - Small bilateral pleural effusions and large volume ascites are noted - Incidentally noted is a 3 mm nodule in the right upper lobe (series 6, image 19). Comparison with outside films is recommended if available. If not available, then by Fleischner criteria consider a dedicated chest CT in 12 months if cancer risk factors ARE present.   Assessment & Plan    Acute on Chronic systolic congestive heart failure Nonischemic cardiomyopathy Pulmonary HTN --Volume status improving but still suboptimal with pt report of minimal sx, despite accessor muscle use and work of breathing on exam. Of note, previous cMRI  without infiltrative dz as above. LHC in past without obstructive dz. Tachycardia induced CM has been considered in the past due to nonobstructive  CAD. He continues to note occasional racing HR / weakness. Suspect sx multifactorial in the setting of afib, liver dz, sedentary lifestyle, smoldering myeloma dx, AOCKD, anemia, low output HFrEF, pulmonary HTN, anemia, and deconditioning. Medication compliance has been an issue in the past as well, which is contributing, and with pt report that he does not like taking his diuretic twice daily due to his need to constantly use the restroom. Discussed that Afib can contribute to both volume retention and result in tachycardia induced CM - volume status also discussed in terms of findings on his recent echo. Reviewed echo as above with severely reduced EF and compared with previous echos. Noted that echo was reviewed by primary cardiologist prior to this visit with recommendations on result note reviewed. He does report some sx improvement in his volume status s/p recent paracentesis. Still volume up on exam though wt improved from previous visit. Escalation of GDMT continues to be limited by hypotension as in the past with low OP HF, pulmonary HT, compliance, as well as CKD.Discussed that in past ICD recommended for low output HF with history of VT (previously had a lifevest in the past), deferred by pt.   Continue torsemide 14m BID with compliance stressed today (pt only taking once per day).  Nephrology referral provided - suspect at least some element of cardiorenal syndrome. BMET, BNP discussed today - per pt preference, declined today and will obtain at RTC to recheck renal function and recheck electrolytes, given PVCs.  Discussed future considerations TBD - [name] referral if considered a candidate at RTC (past discussion regarding cardiac device revisited). Referral to advanced HF clinic in [place].    HF education provided today at length - work of breathing  noted on exam, which we discussed. Dietary changes, increased activity. Continue to avoid EtOH. Fluid and salt restriction discussed in detail. Max 2L fluid /2g Na per day. Close home monitoring of daily weights/ BP.  Case discussed with primary cardiologist same day in terms of volume status. Will reach out to primary cardiologist regarding possible mass / thrombus findings and recommendation for any repeat echo / cardiac imaging at this time. Again, he does confirm [place]. As previously noted, outlook poor at this time.   Nonobstructive CAD by LHC --No current CP. DOE reported with volume overload as above. Recent echo as above.[facility] LHC (see above CV studies) without evidence of obstructive CAD. EKG today without acute ST/T changes from that of prior tracings. Continue Eliquis in place of ASA. Continue current BB and statin.   Permanent atrial fibrillation / NSVT /PVCs --Reports some palpitations. History of Afib / NSVT. Afib with PVCs on EKG today. Previous 01/2020 monitor as above. BMET deferred per pt preference as above - recheck at RTC to recheck electrolytes. Prior history of ablation, which did not hold. [facility] It has been suspected in the past that EF may be reduced due to tachycardia induced CM given nonobstructive CAD and cMRI without infiltrative dz. As above, recommend referral to [name] and will plan on referral once compliance confirmed and could consider device at that time. Continue metoprolol for rate control as BP tolerates due to low OP HF and with goal HR below 110. Digoxin discontinued in past 2/2 Cr and amiodarone not ideal 2/2 liver dz. Continue [place] CHA2DS2VASc score of ~ 5 (CHF, HTN, agex1, vascular, likely DM2). Continue Eliquis 560mBID. Confirms compliance, denies any s/sx of bleeding.Will reach out to primary cardiologist  regarding concern of possible thrombus versus mass on most recent ech as above. Discussed compliance and risk of stroke at  length.  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 Most recent echo with Moderate to severe MR, severe TR, and mild AR. Continue to monitor. Consider referral to advanced HF team as above. Could consider TEE in the future.  CKD --Cardiorenal syndrome suspected. Discussed repeat BMET to be collected today - deferred to RTC per pt at checkout. Recommended establishing with nephrology with ongoing reduced renal function. Referral provided today.  Anemia, anemia of chronic dz --Discussed repeat CBC to be collected today - deferred to RTC at checkout. We did review that anemia can contribute to his sx. Denies s/sx of bleeding on [place].  Smoldering multiple myeloma --Follow-up with oncology as scheduled. Cardiac MRI without infiltrative CM.  Liver Cirrhosis --Reports recent paracentesis 5/27 with some sx improvement. Continue lactulose. Follow-up per IM/GI/oncology. Close monitoring of liver function on low dose statin medication. Ongoing alcohol cessation recommended.   Hypothyroidism --03/2019 TSH wnl. Continue Synthroid.   History of Elevated A1C --Elevated in 2019. Recommend tight glycemic control for control to prevent worsening renal and cardiovascular dz. Per PCP.  Past alcohol and tobacco use --Ongoing cessation advised. Reports no EtOH use in 8 years.    Medication changes: None - compliance with torsemide discussed Labs ordered: Deferred per pt to RTC Studies / Imaging ordered: None Future considerations: BMET, CBC, BNP, [name] referral, Advanced HF clinic referral, reach out to Dr. Rockey [name] regarding if repeat echo recommended to confirm no mass/thrombus Disposition: RTC 2 weeks    *Please be aware that the above documentation was completed voice recognition software and may contain associated dictation errors.     Arvil Chaco, PA-C 06/19/2020

## 2020-06-20 DIAGNOSIS — I361 Nonrheumatic tricuspid (valve) insufficiency: Secondary | ICD-10-CM | POA: Diagnosis not present

## 2020-06-20 DIAGNOSIS — Z01818 Encounter for other preprocedural examination: Secondary | ICD-10-CM | POA: Diagnosis not present

## 2020-06-20 DIAGNOSIS — I340 Nonrheumatic mitral (valve) insufficiency: Secondary | ICD-10-CM | POA: Diagnosis not present

## 2020-06-20 DIAGNOSIS — D126 Benign neoplasm of colon, unspecified: Secondary | ICD-10-CM | POA: Diagnosis not present

## 2020-06-20 DIAGNOSIS — R569 Unspecified convulsions: Secondary | ICD-10-CM | POA: Diagnosis not present

## 2020-06-20 DIAGNOSIS — I639 Cerebral infarction, unspecified: Secondary | ICD-10-CM | POA: Diagnosis not present

## 2020-06-20 DIAGNOSIS — I480 Paroxysmal atrial fibrillation: Secondary | ICD-10-CM | POA: Diagnosis not present

## 2020-06-20 DIAGNOSIS — D472 Monoclonal gammopathy: Secondary | ICD-10-CM | POA: Diagnosis not present

## 2020-06-20 DIAGNOSIS — I428 Other cardiomyopathies: Secondary | ICD-10-CM | POA: Diagnosis not present

## 2020-06-20 DIAGNOSIS — K7290 Hepatic failure, unspecified without coma: Secondary | ICD-10-CM | POA: Diagnosis not present

## 2020-06-20 DIAGNOSIS — I10 Essential (primary) hypertension: Secondary | ICD-10-CM | POA: Diagnosis not present

## 2020-06-20 DIAGNOSIS — I5022 Chronic systolic (congestive) heart failure: Secondary | ICD-10-CM | POA: Diagnosis not present

## 2020-06-20 DIAGNOSIS — E039 Hypothyroidism, unspecified: Secondary | ICD-10-CM | POA: Diagnosis not present

## 2020-06-26 DIAGNOSIS — N179 Acute kidney failure, unspecified: Secondary | ICD-10-CM | POA: Diagnosis not present

## 2020-06-26 DIAGNOSIS — R8290 Unspecified abnormal findings in urine: Secondary | ICD-10-CM | POA: Diagnosis not present

## 2020-06-26 DIAGNOSIS — R809 Proteinuria, unspecified: Secondary | ICD-10-CM | POA: Diagnosis not present

## 2020-06-26 DIAGNOSIS — I10 Essential (primary) hypertension: Secondary | ICD-10-CM | POA: Diagnosis not present

## 2020-06-26 DIAGNOSIS — I509 Heart failure, unspecified: Secondary | ICD-10-CM | POA: Diagnosis not present

## 2020-06-26 DIAGNOSIS — D631 Anemia in chronic kidney disease: Secondary | ICD-10-CM | POA: Diagnosis not present

## 2020-07-01 ENCOUNTER — Other Ambulatory Visit: Payer: Self-pay | Admitting: Gastroenterology

## 2020-07-01 ENCOUNTER — Other Ambulatory Visit (HOSPITAL_COMMUNITY): Payer: Self-pay | Admitting: Gastroenterology

## 2020-07-01 DIAGNOSIS — K2980 Duodenitis without bleeding: Secondary | ICD-10-CM | POA: Diagnosis not present

## 2020-07-01 DIAGNOSIS — I864 Gastric varices: Secondary | ICD-10-CM

## 2020-07-01 DIAGNOSIS — K7290 Hepatic failure, unspecified without coma: Secondary | ICD-10-CM | POA: Diagnosis not present

## 2020-07-01 DIAGNOSIS — K319 Disease of stomach and duodenum, unspecified: Secondary | ICD-10-CM

## 2020-07-01 DIAGNOSIS — D472 Monoclonal gammopathy: Secondary | ICD-10-CM | POA: Diagnosis not present

## 2020-07-01 DIAGNOSIS — R933 Abnormal findings on diagnostic imaging of other parts of digestive tract: Secondary | ICD-10-CM

## 2020-07-01 DIAGNOSIS — K7031 Alcoholic cirrhosis of liver with ascites: Secondary | ICD-10-CM

## 2020-07-01 DIAGNOSIS — D696 Thrombocytopenia, unspecified: Secondary | ICD-10-CM | POA: Diagnosis not present

## 2020-07-01 DIAGNOSIS — I5022 Chronic systolic (congestive) heart failure: Secondary | ICD-10-CM | POA: Diagnosis not present

## 2020-07-04 ENCOUNTER — Other Ambulatory Visit: Payer: Self-pay

## 2020-07-04 ENCOUNTER — Ambulatory Visit: Payer: PPO | Admitting: Physician Assistant

## 2020-07-04 ENCOUNTER — Encounter: Payer: Self-pay | Admitting: Physician Assistant

## 2020-07-04 VITALS — BP 100/78 | HR 96 | Ht 73.0 in | Wt 221.1 lb

## 2020-07-04 DIAGNOSIS — I472 Ventricular tachycardia: Secondary | ICD-10-CM

## 2020-07-04 DIAGNOSIS — Z7901 Long term (current) use of anticoagulants: Secondary | ICD-10-CM

## 2020-07-04 DIAGNOSIS — E785 Hyperlipidemia, unspecified: Secondary | ICD-10-CM

## 2020-07-04 DIAGNOSIS — C9000 Multiple myeloma not having achieved remission: Secondary | ICD-10-CM

## 2020-07-04 DIAGNOSIS — I872 Venous insufficiency (chronic) (peripheral): Secondary | ICD-10-CM

## 2020-07-04 DIAGNOSIS — I4729 Other ventricular tachycardia: Secondary | ICD-10-CM

## 2020-07-04 DIAGNOSIS — N1831 Chronic kidney disease, stage 3a: Secondary | ICD-10-CM

## 2020-07-04 DIAGNOSIS — Z9889 Other specified postprocedural states: Secondary | ICD-10-CM | POA: Diagnosis not present

## 2020-07-04 DIAGNOSIS — I071 Rheumatic tricuspid insufficiency: Secondary | ICD-10-CM

## 2020-07-04 DIAGNOSIS — I428 Other cardiomyopathies: Secondary | ICD-10-CM | POA: Diagnosis not present

## 2020-07-04 DIAGNOSIS — I5020 Unspecified systolic (congestive) heart failure: Secondary | ICD-10-CM

## 2020-07-04 DIAGNOSIS — I4821 Permanent atrial fibrillation: Secondary | ICD-10-CM | POA: Diagnosis not present

## 2020-07-04 DIAGNOSIS — G4730 Sleep apnea, unspecified: Secondary | ICD-10-CM | POA: Diagnosis not present

## 2020-07-04 DIAGNOSIS — I5023 Acute on chronic systolic (congestive) heart failure: Secondary | ICD-10-CM

## 2020-07-04 DIAGNOSIS — Z79899 Other long term (current) drug therapy: Secondary | ICD-10-CM

## 2020-07-04 DIAGNOSIS — I25118 Atherosclerotic heart disease of native coronary artery with other forms of angina pectoris: Secondary | ICD-10-CM

## 2020-07-04 DIAGNOSIS — I340 Nonrheumatic mitral (valve) insufficiency: Secondary | ICD-10-CM

## 2020-07-04 DIAGNOSIS — N189 Chronic kidney disease, unspecified: Secondary | ICD-10-CM

## 2020-07-04 DIAGNOSIS — I493 Ventricular premature depolarization: Secondary | ICD-10-CM

## 2020-07-04 DIAGNOSIS — I4819 Other persistent atrial fibrillation: Secondary | ICD-10-CM

## 2020-07-04 DIAGNOSIS — N170 Acute kidney failure with tubular necrosis: Secondary | ICD-10-CM | POA: Diagnosis not present

## 2020-07-04 DIAGNOSIS — I10 Essential (primary) hypertension: Secondary | ICD-10-CM | POA: Diagnosis not present

## 2020-07-04 DIAGNOSIS — D472 Monoclonal gammopathy: Secondary | ICD-10-CM

## 2020-07-04 NOTE — Patient Instructions (Signed)
Medication Instructions:  Your physician has recommended you make the following change in your medication:   You make take both Torsemide tablets (20mg ) at lunch time  *If you need a refill on your cardiac medications before your next appointment, please call your pharmacy*   Lab Work:  BMET, TSH Today  If you have labs (blood work) drawn today and your tests are completely normal, you will receive your results only by: [place] (if you have MyChart) OR A paper copy in the mail If you have any lab test that is abnormal or we need to change your treatment, we will call you to review the results.   Testing/Procedures:  WatchPAT?  Is a FDA cleared portable home sleep study test that uses a watch and 3 points of contact to monitor 7 different channels, including your heart rate, oxygen saturations, body position, snoring, and chest motion.  The study is easy to use from the comfort of your own home and accurately detect sleep apnea.  Before bed, you attach the chest sensor, attached the sleep apnea bracelet to your nondominant hand, and attach the finger probe.  After the study, the raw data is downloaded from the watch and scored for apnea events.   For more information: [email or web address]   Follow-Up: At [facility], you and your health needs are our priority.  As part of our continuing mission to provide you with exceptional heart care, we have created designated Provider Care Teams.  These Care Teams include your primary Cardiologist (physician) and Advanced Practice Providers (APPs -  Physician Assistants and Nurse Practitioners) who all work together to provide you with the care you need, when you need it.  We recommend signing up for the patient portal called "MyChart".  Sign up information is provided on this After Visit Summary.  MyChart is used to connect with patients for Virtual Visits (Telemedicine).  Patients are able to view lab/test results,  encounter notes, upcoming appointments, etc.  Non-urgent messages can be sent to your provider as well.   To learn more about what you can do with MyChart, go to [email or web address].    Your next appointment:   2 week(s)  The format for your next appointment:   In Person  Provider:   You may see Ida Rogue, MD or one of the following Advanced Practice Providers on your designated Care Team:   Murray Hodgkins, NP Christell Faith, PA-C Marrianne Mood, PA-C Cadence [place], [place] Laurann Montana, NP

## 2020-07-04 NOTE — Progress Notes (Signed)
Afib   Office Visit    Patient Name: [name] Date of Encounter: 07/04/2020  Primary Care Provider:  Albina Billet, MD Primary Cardiologist:  Ida Rogue, MD  Chief Complaint    Chief Complaint  Patient presents with   2 week follow up     Patient c/o blood pressure running low. Medications reviewed by the patient verbally.   70 year old male with history of NICM, CAD s/p LHC 02/15/2019, chronic HFrEF, Afib s/p DCCV 02/16/2019, hypothyroidism, CKD, cirrhosis/NASH, GERD, pre-DM2, MGUS, h/o colonic adenomas , h/o esophageal dysphagia, past history of alcohol and tobacco use, leukocytoclastic vasculitis, and who presents today for follow-up after echo.  Past Medical History    Past Medical History:  Diagnosis Date   (HFpEF) heart failure with preserved ejection fraction ([place])    a. 03/2019 cMRI: EF 30-40%, glob HK; b. 05/2019 Echo [facility]): EF 35%.   CHF (congestive heart failure) (HCC)    CKD (chronic kidney disease), stage III (HCC)    Coumadin resistance (HCC)    Diabetes mellitus without complication ([place])    Dysrhythmia    A. Fib   Fatty liver    H/O ETOH abuse    Hepatic encephalopathy (HCC)    history   Hx of adenomatous polyp of colon    Hx of thrombocytopenia    Hypertension    Hypothyroidism    Macrocytic anemia    Mixed hyperlipidemia    NICM (nonischemic cardiomyopathy) ([place])    a. 03/2019 cMRI: EF 30-40%, glob HK. No LGE. No evidence of infiltrative cardiomyopathy; b. 05/2019 Echo [facility]): EF 35%, mild to mod MR, sev dil LA, sev dil RV w/ mod reduced RV fxn. Sev TR. Mild-mod PAH (PASP 25mHg), sev dil RA.   Nonobstructive CAD (coronary artery disease)    a. 02/2019 Cath ([facility]: LM nl, LAD 469mD1 small, D2 60ost, D3 small, LCX nl, OM2 25, RCA 4010m5d.   Persistent atrial fibrillation ([place]  a. CHA2DS2VASc = 5-->eliquis; b. [facility]  Valvular heart disease    [facility]. [facility], sev TR.   Past Surgical History:  Procedure  Laterality Date   CARDIAC CATHETERIZATION     no stents   COLONOSCOPY     COLONOSCOPY WITH PROPOFOL N/A 07/26/2015   Procedure: COLONOSCOPY WITH PROPOFOL;  Surgeon: RobManya SilvasD;  [facility];  Service: Endoscopy;  Laterality: N/A;   COLONOSCOPY WITH PROPOFOL N/A 10/03/2019   Procedure: COLONOSCOPY WITH PROPOFOL;  Surgeon: LocLesly RubensteinD;  Location: [facility] ENDOSCOPY;  Service: Endoscopy;  Laterality: N/A;   ESOPHAGOGASTRODUODENOSCOPY (EGD) WITH PROPOFOL N/A 10/03/2019   Procedure: ESOPHAGOGASTRODUODENOSCOPY (EGD) WITH PROPOFOL;  Surgeon: LocLesly RubensteinD;  Location: [facility] ENDOSCOPY;  Service: Endoscopy;  Laterality: N/A;   HAMMER TOE SURGERY      Allergies  Allergies  Allergen Reactions   Penicillins Hives and Rash    Reaction to PCN injection age 51, 63olerates PO PCN    History of Present Illness    [name] a 70 35o. male with PMH as above.  [facility] with all records reviewed by myself prior to today's appointment.  [facility] with Dr. GolRockey Situ  [facility] and volume up with increased furosemide resulting in minimal increase in urine output. 05/05/2018 echo showed LVEF 35  to 40% with global hypocontractility, severe BiAE, moderate RVE, mild AR/PR, moderate MR/TR and moderate PHTN.    He was admitted to [facility] 01/2019 with b/l LEE, tremor, and AMS, overload, and decompensated heart failure. Echo showed worsening EF 25 to 30% and moderate MR with reduced RVSF and severe TR.   He was IV diuresed and -13L and near 30lbs during his hospital stay.   Given he had never had a prior ischemic evaluation, he underwent PET CT with multiple reversible defects.   He underwent LHC 1/21 that demonstrated nonobstructive dz and pulmonary HTN. 60% ostial D2 stenosis noted.  It was noted that the etiology for CM remained unclear,  as LHC did not identify enough CAD to account for his reduced LV function.    He was started on Entresto and continued on metoprolol. Given his history of MGUS, amyloid was noted to remain a consideration, though it was overall favored to be less likely.    He underwent cardiac MRI without evidence of infiltrative cardiomyopathy. Both ICD and ablation were discussed during his admission. Given his recent cath, it was suspected that his reduced EF was likely 2/2 previous heavy alcohol use and Afib / NSVT/ tachycardia mediated CM.   He underwent 02/16/19 cardioversion with restoration of NSR. Digoxin was stopped 2/2 bradycardia / hypotension, as following cardioversion, he initially had junctional bradycardia with slow return of sinus node function. He was started on Eliquis at discharge.  He was discharged with a Life-vest given NSVT during admission and referred to [facility] as an outpatient for consideration of ICD.   Escalation of HF tx / GDMT has been limited by hypotension since that time. Toprol decreased to 12.30m BID to allow for room in BP. EDelene Lollhas been stopped and restarted in the past, currently held. Seen 12/2019 with repeat echo, carotid, and cardiac monitoring performed. Seen in the office 03/2019 with vague complaints of orthostasis. He had very high intake with weight trending upwards and recommendation to take an additional 464mof lasix after lunch for ankle edema. He was instructed to call the office if home weight went above 220lbs.   [facility]/19, [facility] hepatic encephalopathy, AOCKD, and left lower extremity cellulitis.    When seen 11/2019, multiple studies ordered as below: --12/2019 carotids with minimal to moderate bilateral atherosclerotic plaque R>L but not resulting in hemodynamically significant stenosis within either internal carotid. --01/2020 cardiac monitoring with 100% burden Afib and 5 runs of VT, as well as idioventricular rhythm and  ectopy.  --05/2020 (delay in obtaining) Echo EF severely reduced at 25-30%, LV global hypokinesis, severe LV dilation, indeterminate LV diastolic parameters, faint echos at RV apex and thought to represent artifact and/or prominent moderator band though focal wall thickening, possible thrombus, or possible mass unable to be excluded, RVSF moderately reduced with RV severely enlarged and severely elevated PASP, severe LAE and RAE, degenerative MV with moderate to severe MR, severe TR, mild AR, RAP 1559m.  Seen 06/10/20 with recommendation for compliance with medication. He noted scrotal edema and worsening SOB with wt gain and abdominal distention. Torsemide increased to 42m77mD for 1 week or until seen at clinic. GDMT unable to be escalated due to SBP 100. It was thought sx exacerbated by Afib with elevated ventricular rate.   Seen 06/20/2020 with racing heart rate and tachypalpitations with ambulation.  He reported feeling shaky and weak, especially with walking.  He did not like taking his diuretic or needing  to use the restroom often.  He noted continued orthostasis/dizziness without recent syncope or falls.  Unstable gait noted.  Relatively sedentary lifestyle reported, usually only ambulating to the restroom and kitchen.  Increased activity encouraged.  He noted bilateral lower extremity edema.  He reported compliance with Eliquis.  He denied alcohol use.  He wondered about his upcoming procedure with [facility]--we discussed that no preoperative request has been sent to our preop pool yet.  Case discussed with primary cardiologist same day with recommendation for weekly visits to the clinic to ensure compliance with his torsemide and close monitoring with repeat BMET.  No medication changes were made, given the visit was spent encouraging the patient to take his current medications.  Since that time, he has been seen by gastroenterology with concern regarding the patient's ascites.  To ensure continuity of  care, GI reached out to [place] and asked regarding our thoughts of starting the patient on spironolactone 50 to 100 mg daily.  It was noted a lot of his ascites was thought due to noncompliance with his torsemide twice daily and not adhering to the recommended fluid restrictions and low-salt diet.  His most recent paracentesis site shown cardiac ascites.  Today, 07/04/2020, he returns to clinic and notes a slight improvement in fluid status.  He is taking his torsemide on a more regular basis than previous but continues to report only taking it once per day.  He states that he does not take his morning dose of torsemide whenever he has a doctor's appointment, which has been frequently as of late.  We discussed that he should work to schedule all doctors visits for 8 AM slots or first thing in the morning and then plan to take 2 torsemide pills when he first gets home and after his morning doctors visits.  It was discussed that this would be more likely to be a successful diuretic regimen for the patient.  Currently, he will skip his morning dose of diuresis and then take his afternoon/lunch dose.  We did discuss that taking afternoon/lunch and later doses would likely be due to urination throughout the night.  Thus, the earlier he is able to obtain doctors visits in the morning, the better it will be for the timing of his diuresis.  Once we have confirmed that he is taking at least 2 torsemide pills per day, we discussed starting him on spironolactone as recommended by GI.  Given his low BP, we discussed starting at a lower dose of spironolactone 12.5 mg daily and escalating as tolerated.  Today, he remains hypotensive with BP 100/78.  He denies any chest pain or shortness of breath at rest.  He does note that his weight has been decreasing over the last week with consideration of recent paracentesis.  No reported tachypalpitations.  Overall, he feels his dyspnea has improved.  He continues to feel weak and dizzy,  estimated at least once per week.  He notices dizziness with position changes.  No syncope, or falls.  He reports ongoing lower extremity edema, though he feels this is also improved since his previous clinic visit.  He is currently using 1 pillow with the head of the bed raised at least 45 degrees.  He feels short of breath if lying flat at times.  He reports ongoing abdominal distention, though somewhat improved with consideration of recent paracentesis.  He does wonder the utility of continuing to follow with cardiology/medical management, as he states that "the needle" does a better job  of getting the fluid off of him without he is having to urinate all of the time and wonders if it might be worthwhile to just continue with GI.  We discussed that his paracentesis was more geared towards his abdominal ascites, and while helpful overall with symptomatic relief of his symptoms, ongoing diuresis with torsemide was recommended to prevent worsening cardiorenal syndrome at this time.  He does continue to urinate with the torsemide, though he does still report dark urine in the a.m.  He denies any melena or hematochezia.  Recent BP, heart rate, and weight reviewed with log brought in today by the patient and his wife.  Heart rate has been well controlled and BP remains soft in the low 100s with rare escalations into the 120s.  Weight has decreased from 221 pounds to recently to 10 pounds, though increased to 213 pounds as of the last few days.  During exam today, she was noted to be in his front pocket with recommendation that he completely stop using chew moving forward.  Physical exam as below with right base crackles noted. He wonders if allergies are contributing  Both patient and his wife request a sleep apnea study today with STOP-BANG assessment performed and watch Pat provided. They wonder about the EGD procedure and if it will ever be performed. Further recommendations as below.   Of note, Heather from the  advanced heart failure clinic was visiting today and provided detailed heart failure education for patient and wife today.  STOP-BANG Rex assessment  S-snore  Have you been told that you snore?   1  T-tired Are you often tired, fatigued, sleepy during the day?   1  O-obstruction Do you stop breathing, choke, or gasp during sleep?   0  P-pressure Do you have or are you being treated for high blood pressure?  0  B- BMI Is your BMI greater than 35 kg/m?  0  A- age Are you 34 years or older?  1  N-neck Do you have a neck circumference greater than 16 inches?  0  G-gender Are you male?   1  Total  4   Home Medications    Prior to Admission medications   Medication Sig Start Date End Date Taking? Authorizing Provider  apixaban (ELIQUIS) 5 MG TABS tablet Take by mouth 2 (two) times daily.  02/18/19   [provider]  atorvastatin (LIPITOR) 10 MG tablet Take 10 mg by mouth daily.    [provider]  cephALEXin (KEFLEX) 500 MG capsule Take 1 capsule (500 mg total) by mouth every 6 (six) hours for 9 days. 04/07/19 04/16/19  Nolberto Hanlon, MD  clobetasol cream (TEMOVATE) 0.81 % Apply 1 application topically 2 (two) times daily.    [provider]  gabapentin (NEURONTIN) 100 MG capsule Take 100 mg by mouth 4 (four) times daily.    [provider]  lactobacillus acidophilus (BACID) TABS tablet Take 2 tablets by mouth 3 (three) times daily. 04/07/19   Nolberto Hanlon, MD  lactulose (CHRONULAC) 10 GM/15ML solution Take 45 mLs (30 g total) by mouth 2 (two) times daily. 04/07/19 05/07/19  Nolberto Hanlon, MD  levothyroxine (SYNTHROID, LEVOTHROID) 100 MCG tablet Take 100 mcg by mouth every morning. 10/29/17   [provider]  magnesium oxide (MAG-OX) 400 MG tablet Take 2 tablets by mouth 2 (two) times daily.  02/18/19 02/18/20  [provider]  metoprolol succinate (TOPROL-XL) 25 MG 24 hr tablet 25 mg daily.  02/10/16  [provider]  mupirocin  ointment (BACTROBAN) 2 % Place 1 application into the nose 2 (two) times daily. 04/07/19   Nolberto Hanlon, MD  pantoprazole (PROTONIX) 20 MG tablet Take 20 mg by mouth daily.  02/02/19   [provider]    Review of Systems    He denies chest pain. He reports ongoing but improved dyspnea and lower extremity edema.  He reports orthopnea with 1 pillow and head of the bed elevated at 45 degrees, shortness of breath when lying flat.  He reports abdominal distention, though alleviated with recent paracentesis.  He has amber urine in the morning, clearing later in the day.  He reports ongoing urine output with torsemide.  No melena or hematochezia.  He reports symptoms consistent with orthostasis/dizziness with position changes.  He wonders about sleep apnea and allergies contributing to his presentation.  He denies pnd, v, syncope,, or early satiety.  He notes weakness and fatigue.   all other systems reviewed and are otherwise negative except as noted above.  Physical Exam    VS:  BP 100/78 (BP Location: Left Arm, Patient Position: Sitting, Cuff Size: Normal)   Pulse 96   Ht _0  (1.854 m)   Wt 221 lb 2 oz (100.3 kg)   SpO2 97%   BMI 29.17 kg/m  , BMI Body mass index is 29.17 kg/m. GEN: Frail and elderly male, in no acute distress. Seated in wheelchair.  Joined by his wife. HEENT: Less accessory muscle use noted than in previous clinic visit earlier this month. Neck: Supple, no carotid bruits. Accessory muscle use noted. Cardiac: IRIR, 2/6 systolic murmur, 1/6 diastolic murmur, No rubs, or gallops. No clubbing, cyanosis, bilateral 1-2+ lower extremity edema and LLE edema  >RLEE.  Radials/DP/PT 2+ and equal bilaterally.   Respiratory:  reduced bilaterally, R sided crackles, less accessory muscle use noted. GI: less firm and distended than previous visit, BS + x 4. MS: no deformity or atrophy. Skin: warm and dry, no rash. Skin changes noted as associated with venous insufficiency / chronic  LEE.  Neuro:  Strength and sensation are intact. Psych: Normal affect.  Accessory Clinical Findings    ECG personally reviewed by me today -Afib with PVCs, ventricular rate 96bpm, RBBB, LVH with repolarization abnormalities, ongoing TWI in lateral and precordial leads as seen on prior tracings, Qtc 528m - no acute changes from previous.  VITALS Reviewed today   Temp Readings from Last 3 Encounters:  03/29/20 (!) 96.1 F (35.6 C) (Tympanic)  12/25/19 98.5 F (36.9 C)  12/01/19 (!) 96 F (35.6 C) (Tympanic)   BP Readings from Last 3 Encounters:  07/04/20 100/78  06/19/20 100/60  06/14/20 102/72   Pulse Readings from Last 3 Encounters:  07/04/20 96  06/19/20 96  06/14/20 93    Wt Readings from Last 3 Encounters:  07/04/20 221 lb 2 oz (100.3 kg)  06/19/20 214 lb (97.1 kg)  06/10/20 234 lb (106.1 kg)     LABS  reviewed today   CCulpeperpresent? Yes/No: Yes but [facility] most recent  Lab Results  Component Value Date   WBC 4.2 03/22/2020   HGB 12.3 (L) 03/22/2020   HCT 38.0 (L) 03/22/2020   MCV 99.2 03/22/2020   PLT 87 (L) 03/22/2020   Lab Results  Component Value Date   CREATININE 1.39 (H) 03/22/2020   BUN 23 03/22/2020   NA 138 03/22/2020   K 4.0 03/22/2020   CL 105 03/22/2020   CO2 22 03/22/2020  Lab Results  Component Value Date   ALT 13 11/20/2019   AST 18 11/20/2019   ALKPHOS 127 (H) 11/20/2019   BILITOT 0.9 11/20/2019   No results found for: CHOL, HDL, LDLCALC, LDLDIRECT, TRIG, CHOLHDL  Lab Results  Component Value Date   HGBA1C 5.9 (H) 03/09/2017   Lab Results  Component Value Date   TSH 2.342 04/05/2019     STUDIES/PROCEDURES reviewed today   Echo 06/18/20  1. Left ventricular ejection fraction, by estimation, is 25 to 30%. The  left ventricle has severely decreased function. The left ventricle  demonstrates global hypokinesis. The left ventricular internal cavity size  was severely dilated. Left ventricular  diastolic  parameters are indeterminate.   2. Faint echos are noted near the RV apex, most likely representing  artifact and/or prominent moderator band. However, focal wall thickening,  thrombus, or mass cannot be entirely excluded. Right ventricular systolic  function is moderately reduced. The  right ventricular size is severely enlarged. There is severely elevated  pulmonary artery systolic pressure.   3. Left atrial size was severely dilated.   4. Right atrial size was severely dilated.   5. The mitral valve is degenerative. Moderate to severe mitral valve  regurgitation.   6. Tricuspid valve regurgitation is severe.   7. The aortic valve is tricuspid. Aortic valve regurgitation is mild. No  aortic stenosis is present.   8. The inferior vena cava is dilated in size with <50% respiratory  variability, suggesting right atrial pressure of 15 mmHg  Cardiac monitoring 01/2020 Atrial Fibrillation occurred continuously (100% burden), ranging from 35-187 bpm (avg of 66 bpm).  5 Ventricular Tachycardia runs occurred, the run with the fastest interval lasting 4 beats with a max rate of 156 bpm, the longest lasting 15 beats with an avg rate of 124 bpm.  Difficulty discerning atrial activity making definitive diagnosis difficult to ascertain. Idioventricular Rhythm was present.  Isolated VEs were occasional (3.6%, [id number]), VE Couplets were rare (<1.0%, 15), and no VE Triplets were present. Ventricular Bigeminy and Trigeminy were present.  US Carotid  12/2019 IMPRESSION: Minimal to moderate amount of bilateral atherosclerotic plaque, right subjectively greater than left, not resulting in a hemodynamically significant stenosis within either internal carotid artery.    Cardiac MRI 04/03/19 IMPRESSION: No CMR evidence of infiltrative cardiomyopathy. No myocardial scarring or LGE. Hypokinetic left ventricle with visually estimated ejection fraction between 30-40%.  Cath / PCI: LHC/RHC 02/15/19 1.  Non-flow-limiting coronary artery disease including a 60% ostial D2 stenosis. 2. Mildly elevated left ventricular filling pressures (LVEDP = 13 mm Hg). 3. Elevated right heart filling pressures; RA 11, RV 65/8, PA 61/26 (37), PCW 12, PVR 3.8 4. Moderate pulmonary hypertension. 5. Preserved cardiac output and index; FCO/CI 6.5/2.9, TDCO/CI 5.3/2.4  EP Procedures and Devices:  Successful DCCV 02/16/19  TEE 02/15/18 1. There is a no thrombus seen in the left atrium. 2. There is a no thrombus seen in the right atrium. 3. There is moderate posteriorly directed mitral regurgitation. 4. There is mild aortic regurgitation.  TTE 02/10/18 1. The left ventricle is mildly dilated in size with normal wall thickness. 2. The left ventricular systolic function is severely decreased, LVEF is visually estimated at 25-30%. 3. The mitral valve leaflets are mildly thickened with normal leaflet mobility. 4. There is moderate mitral valve regurgitation. 5. The aortic valve is trileaflet with mildly thickened leaflets with normal excursion. 6. There is mild aortic regurgitation. 7. The left atrium is moderately dilated in size. 8.  The right ventricle is severely dilated in size, with reduced systolic function. 9. There is severe tricuspid regurgitation. 10. There is severe pulmonary hypertension, estimated pulmonary artery systolic pressure is 77 mmHg. 11. The right atrium is severely dilated in size. 12. IVC size and inspiratory change suggest elevated right atrial pressure. (10-20 mmHg).  PET Stress 02/13/18 - Abnormal myocardial perfusion study - There is a small in size, mild in severity, nearly completely reversible defect involving the apical and apical anterior segments. This is consistent with scar with peri-infarct ischemia. - There is a small in size, mild in severity, partially reversible defect involving the basal inferolateral and basal inferolateral segments. This is consistent with scar and  liminal peri-infarct ischemia. Cannot rule out artifact. - During stress: Global systolic function is severely reduced. The ejection fraction calculated at 26%. The left ventricle is dilated. Right ventricular chamber size is severely dilated. Right ventricular systolic function is reduced. - 3 vessel coronary calcifications are noted - Small bilateral pleural effusions and large volume ascites are noted - Incidentally noted is a 3 mm nodule in the right upper lobe (series 6, image 19). Comparison with outside films is recommended if available. If not available, then by Fleischner criteria consider a dedicated chest CT in 12 months if cancer risk factors ARE present.   Assessment & Plan    Acute on Chronic systolic congestive heart failure Nonischemic cardiomyopathy Pulmonary HTN --Compliance with treatment and lifestyle recommendations encouraged with pt reporting symptom improvement with paracentesis and wondering if he can opt for paracentesis over medication management with torsemide, given he dislikes frequent trips to the bathroom.  On further discussion of diuretic regimen, we will plan for him to arranges MD appointments for early a.m. and take 2  torsemide pills as soon as he gets home.  He feels that this regimen will allow him to be more compliant.  At this point, he is pretty regularly only taking 1 torsemide per day.  GI reached out and requested input regarding using torsemide moving forward, given his recent paracentesis and ascites noted.  Given his soft BP and current noncompliance, recommend first escalate to torsemide as prescribed and then add additional spironolactone at low-dose if BP tolerates.  Of note, previous cMRI without infiltrative dz. LHC in past without obstructive dz. Tachycardia induced CM considered due to nonobstructive CAD.  We did discuss repeat ambulatory cardiac monitoring today, politely declined by patient.  He requests instead to have a sleep apnea study with  STOP-BANG assessment performed as above and watch Pat provided.  Escalation of GDMT continues to be limited by hypotension as in the past with low OP HF, pulmonary HT, compliance, as well as CKD. Discussed that in past ICD recommended for low output HF with history of VT (previously had a lifevest in the past), deferred by pt. Presentation likely multifactorial in the setting of afib, liver dz, sedentary lifestyle, smoldering myeloma dx, AOCKD, anemia, low output HFrEF, pulmonary HTN, anemia, medication noncompliance, and deconditioning.   Plan for torsemide as above. Discussed that it is not of benefit to adjust medications until compliance confirmed and given he is unlikely to take more than the 2 torsemide currently recommended. Discussed possible addition of spironolactone - limited by BP.  If further BP needed, and given decompensated low OP HF, consider discontinuing BB- will discuss with MD. May need to discontinue BB to allow for uptitration of diuresis. Repeat labs today, including BMET. HF education provided by advanced HF clinic nurse. Watchpat requested per  pt = provided. Repeat monitor / EP referral has been declined in past and likely of no benefit until compliance confirmed. Case again to be discussed with primary cardiologist and will plan for ongoing communication with GI.   Nonobstructive CAD by LHC --No current CP. DOE reported with volume overload as above. Recent echo as above.[facility] (see above CV studies) without evidence of obstructive CAD. EKG today without acute ST/T changes from that of prior tracings. Continue Eliquis in place of ASA. Continue current BB (for now) and statin. May discontinue BB in future to allow for uptitration of diuresis.   Permanent atrial fibrillation / NSVT /PVCs --Reports some palpitations. History of Afib / NSVT. Afib with PVCs on EKG.  Prior history of ablation. EF may be reduced due to tachycardia induced CM given nonobstructive CAD and cMRI  without infiltrative dz. Referral to EP declined in past and likely of no benefit until compliance confirmed. Continue BB as BP tolerates and will reach out to MD regarding ongoing BB given decompensated low OP HF. May need to discontinue BB  to add spiro and increase diuresis. Digoxin discontinued in past 2/2 Cr and amiodarone not ideal 2/2 liver dz. Continue [place]. CHA2DS2VASc score of ~ 5 (CHF, HTN, agex1, vascular, likely DM2). Continue Eliquis 57m BID. Reached out to primary cardiologist after last visit regarding concern of possible thrombus versus mass on most recent echo as above. Further recommendations pending case discussion with MD.  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 Most recent echo with Moderate to severe MR, severe TR, and mild AR. Continue to monitor. Consider referral to advanced HF team as above. Could consider TEE in the future.  CKD --Cardiorenal syndrome suspected.  Repeat BMET today.  Referral provided to nephrology at previous clinic visit and patient reports he has now established with a nephrologist.  Anemia, anemia of chronic dz -- Repeat labs today.  Smoldering multiple myeloma --Follow-up with oncology as scheduled. Cardiac MRI without infiltrative CM.  Liver Cirrhosis -- Follow-up per IM/GI/oncology. Close monitoring of liver function on low dose statin medication. Ongoing alcohol cessation recommended.   Hypothyroidism --03/2019 TSH wnl. Continue Synthroid.   History of Elevated A1C --Elevated in 2019. Recommend tight glycemic control for control to prevent worsening renal and cardiovascular dz. Per PCP.  Past alcohol and tobacco use --Ongoing cessation advised. Reports no EtOH use in 8 years.    Medication changes: Anticipate it may be helpful to discontinue beta-blocker in the near future to allow for up titration of diuresis and given decompensated low output heart failure. Compliance with  torsemide needed but unable to achieve. Will reach out to primary cardiologist regarding discontinuing his beta-blocker, as this may allow for addition of spironolactone as recommended by GI vs up titration of torsemide.  Low OP HF thus BB likely will limit diuresis. It is possible pt may be amenable to spironolactone over torsemide, given less stigma associated or history associated with use.   Labs ordered: BMET, CBC Studies / Imaging ordered: Watch Pat Per patient request Future considerations: Advanced HF clinic referral, reach out to GI, reach out to Dr. GRockey Situregarding beta-blocker and spironolactone, reach out to MD regarding if repeat echo recommended to confirm no mass/thrombus Disposition: RTC 1-2 weeks as per MD request to monitor   *Please be aware that the above documentation was completed voice recognition software and may contain associated dictation errors.     JMalachi BondsD  Mickle Plumb, PA-C 07/04/2020

## 2020-07-05 ENCOUNTER — Encounter: Payer: Self-pay | Admitting: Physician Assistant

## 2020-07-05 LAB — BASIC METABOLIC PANEL
BUN/Creatinine Ratio: 18 (ref 10–24)
BUN: 29 mg/dL — ABNORMAL HIGH (ref 8–27)
CO2: 22 mmol/L (ref 20–29)
Calcium: 9.1 mg/dL (ref 8.6–10.2)
Chloride: 100 mmol/L (ref 96–106)
Creatinine, Ser: 1.6 mg/dL — ABNORMAL HIGH (ref 0.76–1.27)
Glucose: 138 mg/dL — ABNORMAL HIGH (ref 65–99)
Potassium: 4 mmol/L (ref 3.5–5.2)
Sodium: 139 mmol/L (ref 134–144)
eGFR: 46 mL/min/{1.73_m2} — ABNORMAL LOW (ref >59)

## 2020-07-05 LAB — TSH: TSH: 6.68 u[IU]/mL — ABNORMAL HIGH (ref 0.450–4.500)

## 2020-07-08 ENCOUNTER — Telehealth: Payer: Self-pay | Admitting: Physician Assistant

## 2020-07-08 NOTE — Telephone Encounter (Signed)
Attempted to call the patient to speak with him regarding his lab work. He advised he would like me to speak with his girlfriend, Vickii Chafe.  I spoke with Vickii Chafe and advised her of the patient's results. She is aware Malachi Bonds has forwarded a copy of these for Dr. Rockey Situ to review and weigh in regarding his elevated creatinine levels. She is aware the patient will also need to follow up with Dr. Hall Busing regarding his abnormal TSH. A copy of the lab work has been routed to Dr. Hall Busing via [place] fax function.  Vickii Chafe is aware we will call back with any further recommendations from Dr. Rockey Situ regarding the patient's rise in kidney function.  Peggy voices understanding of the above and is agreeable.

## 2020-07-08 NOTE — Telephone Encounter (Signed)
Arvil Chaco, PA-C  07/05/2020  2:00 PM EDT      Labs show bump in kidney function with stable potassium. We have not made any medication changes over the last couple of visits. Will forward to the patient's primary cardiologist for further recommendations.   Dr. Rockey Situ, Mr. Ramone has been maintained on torsemide 20 mg x 2 for the last2 visits that I have seen him.   I have not changed any medications.  Please let me know your thoughts.   TSH or thyroid lab abnormal.  Recommend follow-up with PCP regarding Synthroid.

## 2020-07-09 ENCOUNTER — Telehealth: Payer: Self-pay | Admitting: *Deleted

## 2020-07-09 DIAGNOSIS — R262 Difficulty in walking, not elsewhere classified: Secondary | ICD-10-CM | POA: Diagnosis not present

## 2020-07-09 DIAGNOSIS — R200 Anesthesia of skin: Secondary | ICD-10-CM | POA: Diagnosis not present

## 2020-07-09 DIAGNOSIS — R2689 Other abnormalities of gait and mobility: Secondary | ICD-10-CM | POA: Diagnosis not present

## 2020-07-09 DIAGNOSIS — R42 Dizziness and giddiness: Secondary | ICD-10-CM | POA: Diagnosis not present

## 2020-07-09 DIAGNOSIS — R251 Tremor, unspecified: Secondary | ICD-10-CM | POA: Diagnosis not present

## 2020-07-09 DIAGNOSIS — R202 Paresthesia of skin: Secondary | ICD-10-CM | POA: Diagnosis not present

## 2020-07-09 NOTE — Telephone Encounter (Signed)
Attempted to call pt to review instructions and provide PIN #.  No answer. Lmtcb.

## 2020-07-09 NOTE — Telephone Encounter (Signed)
-----   Message from Lauralee Evener, [place] sent at 07/08/2020  1:47 PM EDT ----- Regarding: RE: Hassell Halim Ok to activate. No PA is required. ----- Message ----- From: Solmon Ice, RN Sent: 07/04/2020   4:50 PM EDT To: [facility] Subject: Hassell Halim                                Pt given WatchPat device in clinic.  Please precert Dx: Sleep disordered breathing

## 2020-07-10 ENCOUNTER — Other Ambulatory Visit: Payer: Self-pay

## 2020-07-10 ENCOUNTER — Ambulatory Visit
Admission: RE | Admit: 2020-07-10 | Discharge: 2020-07-10 | Disposition: A | Discharge status: Home / Self Care | Payer: PPO | Source: Ambulatory Visit | Attending: Gastroenterology | Admitting: Gastroenterology

## 2020-07-10 DIAGNOSIS — K7689 Other specified diseases of liver: Secondary | ICD-10-CM | POA: Diagnosis not present

## 2020-07-10 DIAGNOSIS — R933 Abnormal findings on diagnostic imaging of other parts of digestive tract: Secondary | ICD-10-CM | POA: Diagnosis not present

## 2020-07-10 DIAGNOSIS — I864 Gastric varices: Secondary | ICD-10-CM | POA: Insufficient documentation

## 2020-07-10 DIAGNOSIS — K766 Portal hypertension: Secondary | ICD-10-CM | POA: Diagnosis not present

## 2020-07-10 DIAGNOSIS — K4090 Unilateral inguinal hernia, without obstruction or gangrene, not specified as recurrent: Secondary | ICD-10-CM | POA: Diagnosis not present

## 2020-07-10 DIAGNOSIS — K7031 Alcoholic cirrhosis of liver with ascites: Secondary | ICD-10-CM | POA: Insufficient documentation

## 2020-07-10 DIAGNOSIS — K319 Disease of stomach and duodenum, unspecified: Secondary | ICD-10-CM | POA: Insufficient documentation

## 2020-07-10 DIAGNOSIS — K7469 Other cirrhosis of liver: Secondary | ICD-10-CM | POA: Diagnosis not present

## 2020-07-10 NOTE — Telephone Encounter (Signed)
Attempted to call pt and friend Vickii Chafe (DPR approved).  No answer x 2. Lmtcb on both pt and Peggy's vm.

## 2020-07-11 NOTE — Telephone Encounter (Signed)
Spoke with Vickii Chafe, pt's significant other (DPR approved), to give PIN # for [place].  Peggy had trouble downloading app on phone.  She will come to the office today so I can assist her to download app and make sure understands instructions to begin [place] at home.

## 2020-07-16 NOTE — Telephone Encounter (Signed)
Late entry: Peggy did come to office 07/11/20. Attempted to assist with downloading [place] app on phone.  Unable to d/t issue with cell phone. Peggy states pt's son will be in town this week and is going to download app on his phone, and will assist pt to watch informational video so that he may begin home sleep study. Advised pt's son call office if he has any questions/issues causing delay. Peggy voiced understanding.

## 2020-07-17 NOTE — Telephone Encounter (Signed)
Spoke with patient and reviewed that we would assess at upcoming appointment next week. Confirmed date and time of appointment and he verbalized understanding with no further questions.     Minna Merritts, MD  Emily Filbert, RN 3 days ago    Appears he has appointment early July with our office, can discuss there  Some noncompliance with his torsemide, renal failure and CHF  Probably should have her follow-up with the same PA not a different PA from last visit  Thx  TG

## 2020-07-22 IMAGING — US US HEPATIC LIVER DOPPLER
1 series · 13 of 25 positions shown · non-contrast
Comparison: Abdominal ultrasound 06/30/2019

CLINICAL DATA: 69-year-old with alcoholic cirrhosis of liver.

EXAM:
DUPLEX ULTRASOUND OF LIVER
TECHNIQUE: Color and duplex Doppler ultrasound was performed to evaluate the
hepatic in-flow and out-flow vessels.

[Series 1: us liver doppler · 13 of 55 slices shown]
[im 1/55]
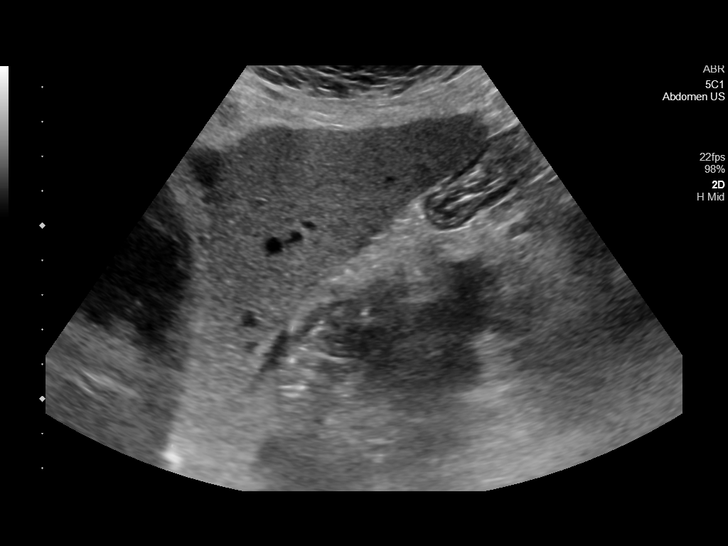
[im 5/55]
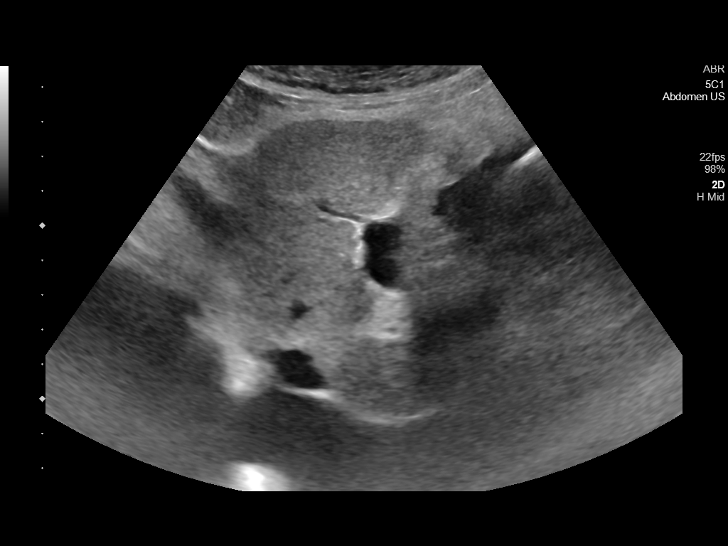
[im 10/55]
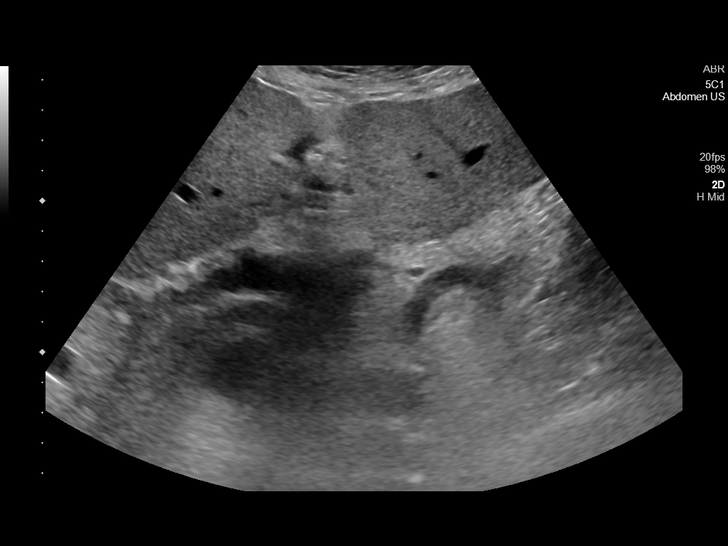
[im 14/55]
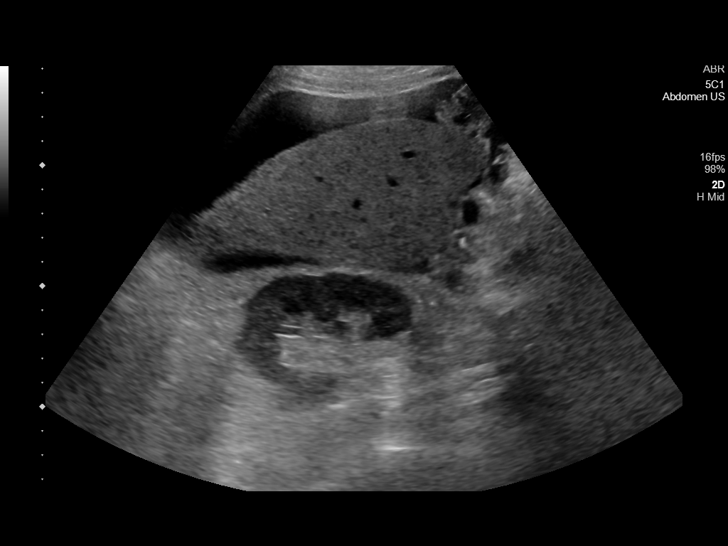
[im 19/55]
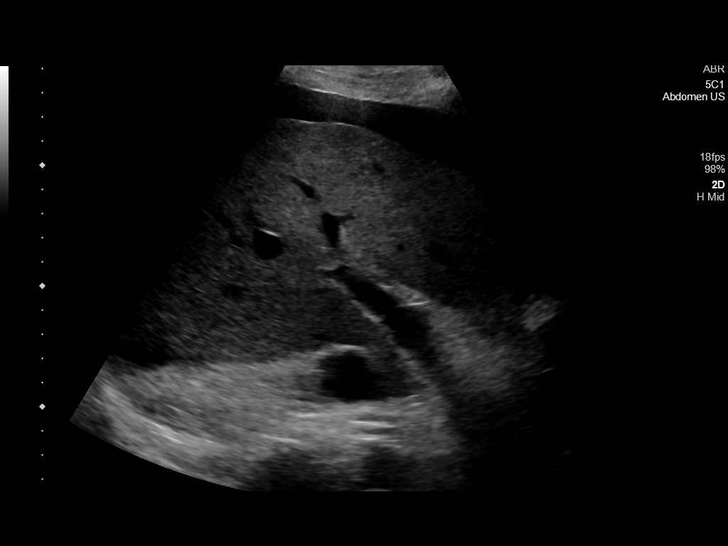
[im 23/55]
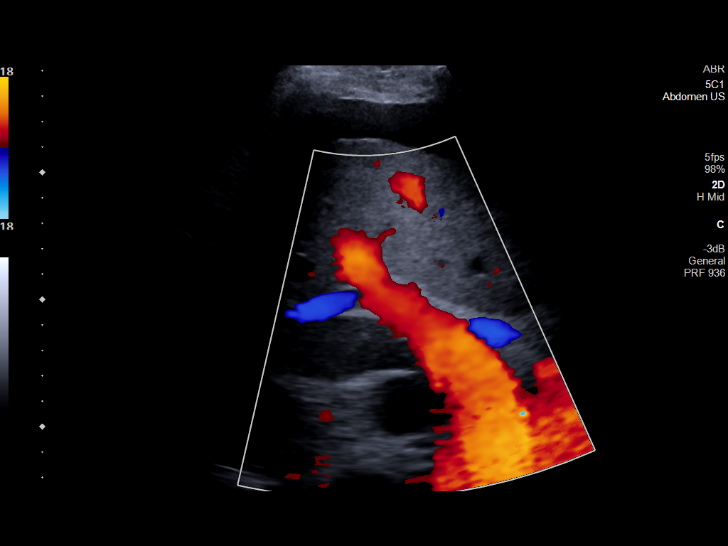
[im 28/55]
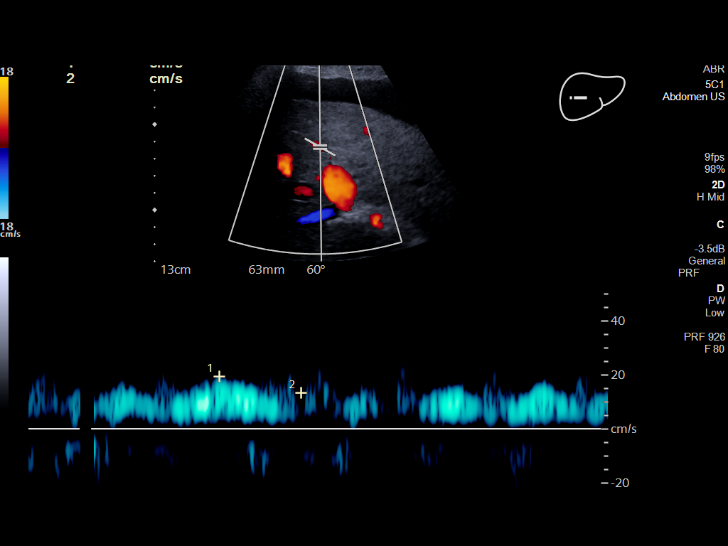
[im 32/55]
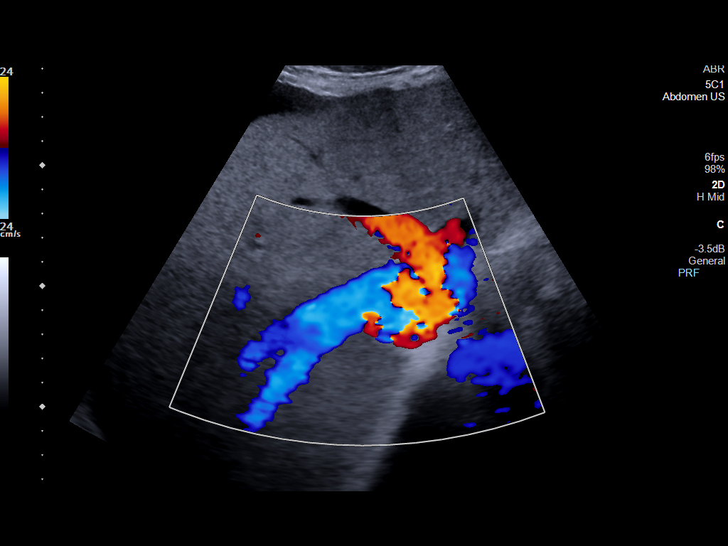
[im 37/55]
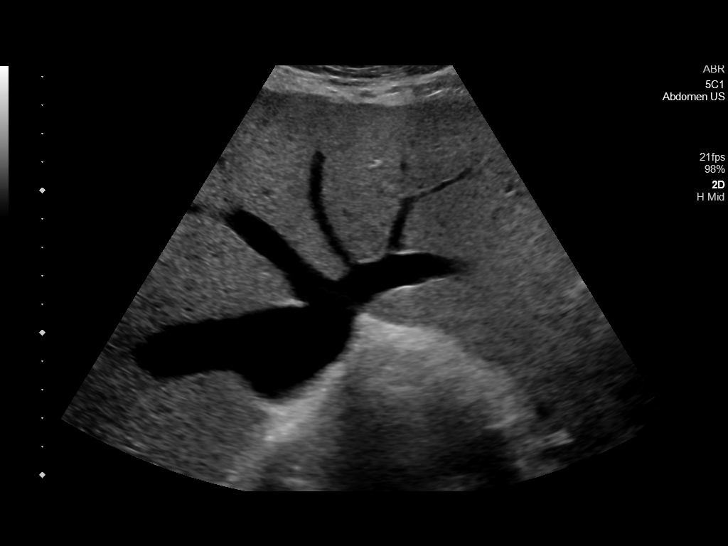
[im 41/55]
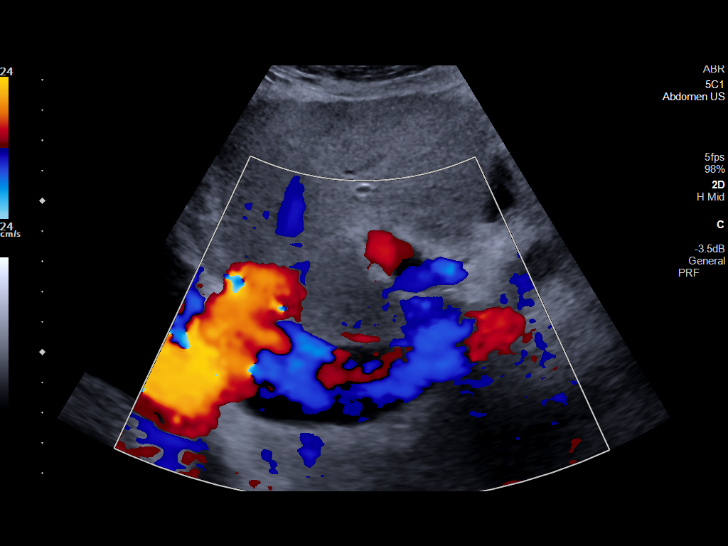
[im 46/55]
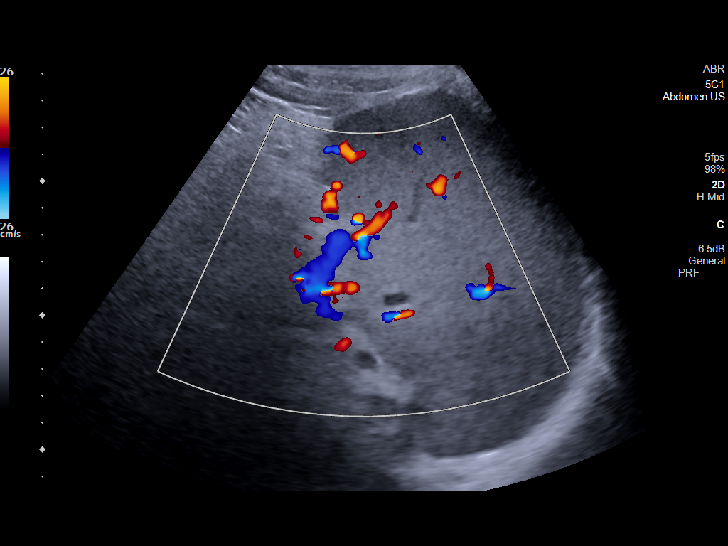
[im 50/55]
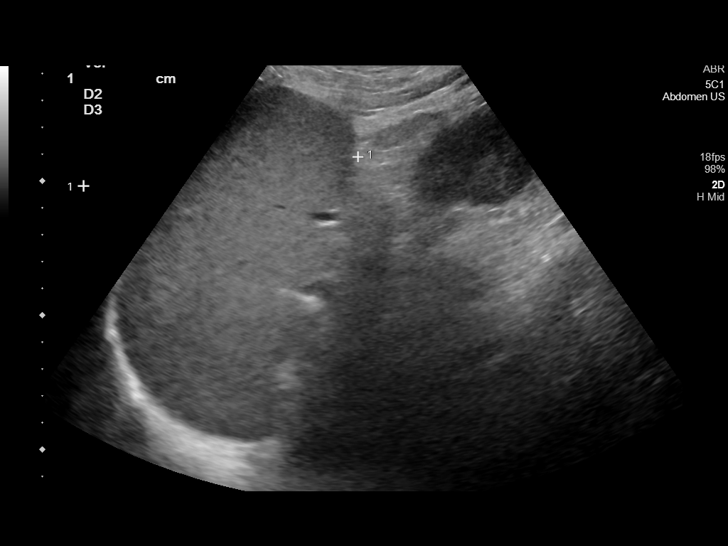
[im 55/55]
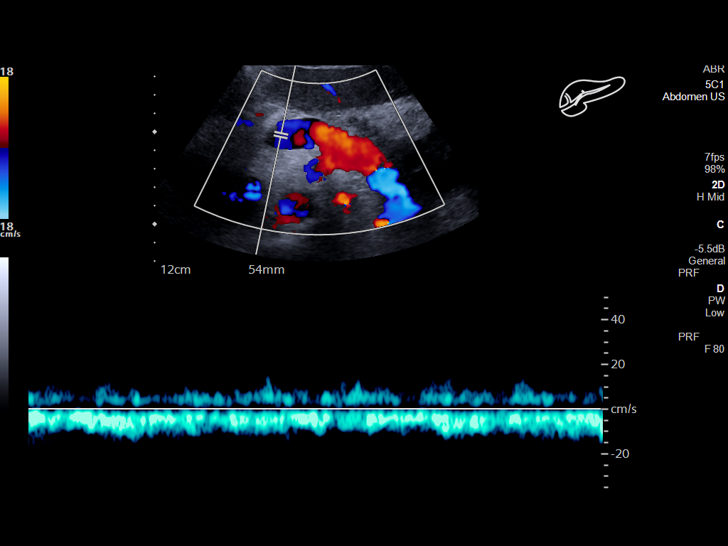

[13 of 25 positions shown; findings below may reference images not displayed]

FINDINGS: Liver: Mild nodular contour with perihepatic ascites. No discrete
liver lesion.

No intrahepatic biliary dilatation.

Main Portal Vein size: 1.4 cm

Portal Vein Velocities

Main Prox:  46 cm/sec

Main Mid: 34 cm/sec

Main Dist:  37 cm/sec
Right: 20 cm/sec
Left: 19 cm/sec

Hepatic Vein Velocities

Right:  45 cm/sec

Middle:  35 cm/sec

Left:  40 cm/sec

IVC: Present and patent with normal respiratory phasicity.

Hepatic Artery Velocity:  48 cm/sec

Splenic Vein Velocity:  16 cm/sec

Spleen: 10.3 cm x 13.3 cm x 8.5 cm with a total volume of 610 cm^3
(411 cm^3 is upper limit normal)

Portal Vein Occlusion/Thrombus: No

Splenic Vein Occlusion/Thrombus: No

Ascites: Present

Varices: None

Normal hepatopetal flow in the portal veins. Normal hepatofugal flow
in the hepatic veins.
IMPRESSION: 1. Cirrhosis with ascites and splenomegaly. Findings are compatible
with portal hypertension.
2. Portal venous system is patent with normal direction of flow.

## 2020-07-23 DIAGNOSIS — I509 Heart failure, unspecified: Secondary | ICD-10-CM | POA: Diagnosis not present

## 2020-07-23 DIAGNOSIS — R809 Proteinuria, unspecified: Secondary | ICD-10-CM | POA: Diagnosis not present

## 2020-07-23 DIAGNOSIS — D631 Anemia in chronic kidney disease: Secondary | ICD-10-CM | POA: Diagnosis not present

## 2020-07-23 DIAGNOSIS — N179 Acute kidney failure, unspecified: Secondary | ICD-10-CM | POA: Diagnosis not present

## 2020-07-23 DIAGNOSIS — I10 Essential (primary) hypertension: Secondary | ICD-10-CM | POA: Diagnosis not present

## 2020-07-23 DIAGNOSIS — R8290 Unspecified abnormal findings in urine: Secondary | ICD-10-CM | POA: Diagnosis not present

## 2020-07-23 NOTE — Progress Notes (Signed)
Cardiology Office Note:    Date:  07/24/2020   ID:  [name], DOB [birth year], MRN [id number]  PCP:  Albina Billet, MD  [facility] Cardiologist:  Ida Rogue, MD  [facility] Electrophysiologist:  None   Referring MD: Albina Billet, MD   Chief Complaint: 2 week follow-up  History of Present Illness:    [name] is a 70 y.o. male with a hx of CAD s/p LHC 02/15/19, chronic HFrEF, Afib s/p DCCV 02/16/19, hypothyroidism, CKD, cirrhosis/NASH, GERD, pre-DM2, MGUS, h/o colonic adenomas, h/o esophageal dysphagia, prior alcohol/tobacco use, leukocytoclastic vasculitis who presents for 2 week follow-up.  Saw [facility] 02/07/19 for volume overload with increased lasix resulting in minimal increase in urine outpaut. Echo 04/2018 showed LVEF 35-40% with global hypocontractility, severe BrAE, moderate RVE, mild AR/PR, mod MR/TR, mod PHTN.   Admitted to [facility] with b/l LEEE, tremor, AMS, and decompensated heart failure. Echo showed LVEF 25-30%, moderate MR, reduced RVSF, and severe TR. He was diuresed 30lbs.   He underwent PET CT revealing multiple reversible defects. He underwent LHC showing nonobstructive dz and pulmonary HTN, 60% ostial D2 stenosis noted. It was noted that etiology for CM remained unclear as LHC did not identify enough CAD to account for reduced LV function.   He was started on Entresto and continued on metoprolol. Given h/o MGUS, amyloid was noted to remain a consideration though it was favored to be less. He underwent cardiac MRI wihtout evidence of infiltrative CM. Both ICD and ablation were discussed during his admission. Given recent cath, it was suspected that his reduced EF was likely 2/2 previous heavy alcohol use and Afib/NSVT/tachymediated CM.   He underwent cardioversion 02/16/19 with restoration of NSR. Digoxin was stopped 2/2 bradycardia/hypotension as following cardioversion, he initially had junctional bradcyardia with slow return of sinus node function.  He was started on Eliquis at discharge. He was discharged with a Life vest given NSVT during admission and referred to [facility] EP as OP for consideration of ICD.   Escalation of GDMT limited by hypotension. Delene Loll has been stopped. Toprol previously decreased. Also has had orthostasis symptoms. Has been instructed to call the office if weights above 220lbs.   Admitted to [facility] 03/2019 for AMS, hepatic encephalopathy, AKI on CKD, left lower extremity cellulitis.   Seen 11/2019 and multiple studies were ordered. US Carotids showed minimal to mod b/l atherosclerotic plaque R>L. Heart monitor 01/2020 showed 100% Afib burden and 5 runs of VT as well as idioventricular rhythm and ectopy. Echo 05/2020 showed EF 25-30%, severe LV dilation, possible RV thrombus or mass, severely enlarged EV and elevated PASP, severe LAE and RAE, degenerative MV with mod to severe MR, severe TR, mild AR, RAP 53mmHg.   Seen 06/10/20 and noted scrotal edema nd weight gain. Torsemide increased to 20mg  for a week. GDMT unable to be escalated due to SBP 100, also exacerbated by Afib with elevated rate.   06/20/20 visit with racing heart rate. Medication compliance was encouraged.   Saw GI who recommended spironolactone 50 to 100mg  for ascites, however it was felt swelling was from noncompliance with Torsemide.   Seen 07/04/20 with slight improvement in fluid stauis. He was only taking torsemide once a day, instead of twice a day. SBP low. Starting spiro was discussed as per GI recommendations. Paracentesis also discussed. Weight down 221>213lbs.   Today, patient reports he has been mostly taking Torsemide twice a day. He will miss one dose if he goes to the doctor  that day. Also taking lactulose. Weights at home have been around 210-215lbs. No dizziness or lightheadedness. Eats no salt diet. He elevates his legs sometimes when sitting. Has tried compression socks in the past, encouraged he try them again. No chest pain or shortness of  breath. Has been following with GI and nephrology. He underwent paracentesis 5/27 and 4.3L taken off. Might need paracentesis again in the future. Sees nephrology next week. Was unable to [facility] app for OSA testing. Will refer to pulmonology for sleep apnea testing.   Past Medical History:  Diagnosis Date   (HFpEF) heart failure with preserved ejection fraction ([place])    a. 03/2019 cMRI: EF 30-40%, glob HK; b. 05/2019 Echo [facility]): EF 35%.   CHF (congestive heart failure) (HCC)    CKD (chronic kidney disease), stage III (HCC)    Coumadin resistance (HCC)    Diabetes mellitus without complication ([place])    Dysrhythmia    A. Fib   Fatty liver    H/O ETOH abuse    Hepatic encephalopathy (HCC)    history   Hx of adenomatous polyp of colon    Hx of thrombocytopenia    Hypertension    Hypothyroidism    Macrocytic anemia    Mixed hyperlipidemia    NICM (nonischemic cardiomyopathy) ([place])    a. 03/2019 cMRI: EF 30-40%, glob HK. No LGE. No evidence of infiltrative cardiomyopathy; b. 05/2019 Echo [facility]): EF 35%, mild to mod MR, sev dil LA, sev dil RV w/ mod reduced RV fxn. Sev TR. Mild-mod PAH (PASP 8mmHg), sev dil RA.   Nonobstructive CAD (coronary artery disease)    a. 02/2019 Cath [facility]): LM nl, LAD 40m, D1 small, D2 60ost, D3 small, LCX nl, OM2 25, RCA 33m, 25d.   Persistent atrial fibrillation ([place])    a. CHA2DS2VASc = 5-->eliquis; b. 05/2019 s/p RFCA/PVI [facility]).   Valvular heart disease    a. 05/2019 Echo [facility]): mild to mod MR, sev TR.    Past Surgical History:  Procedure Laterality Date   CARDIAC CATHETERIZATION     no stents   COLONOSCOPY     COLONOSCOPY WITH PROPOFOL N/A 07/26/2015   Procedure: COLONOSCOPY WITH PROPOFOL;  Surgeon: Manya Silvas, MD;  Location: [facility] ENDOSCOPY;  Service: Endoscopy;  Laterality: N/A;   COLONOSCOPY WITH PROPOFOL N/A 10/03/2019   Procedure: COLONOSCOPY WITH PROPOFOL;  Surgeon: Lesly Rubenstein, MD;  Location: [facility] ENDOSCOPY;  Service: Endoscopy;  Laterality:  N/A;   ESOPHAGOGASTRODUODENOSCOPY (EGD) WITH PROPOFOL N/A 10/03/2019   Procedure: ESOPHAGOGASTRODUODENOSCOPY (EGD) WITH PROPOFOL;  Surgeon: Lesly Rubenstein, MD;  Location: [facility] ENDOSCOPY;  Service: Endoscopy;  Laterality: N/A;   HAMMER TOE SURGERY      Current Medications: Current Meds  Medication Sig   apixaban (ELIQUIS) 5 MG TABS tablet Take by mouth 2 (two) times daily.    atorvastatin (LIPITOR) 10 MG tablet Take 10 mg by mouth daily.   clobetasol cream (TEMOVATE) 8.36 % Apply 1 application topically 2 (two) times daily.   colchicine 0.6 MG tablet Take 0.6 mg by mouth 2 (two) times daily.    gabapentin (NEURONTIN) 100 MG capsule Take 100 mg by mouth 3 (three) times daily.   lactulose (CHRONULAC) 10 GM/15ML solution Take 10 g by mouth every other day.   levothyroxine (SYNTHROID, LEVOTHROID) 100 MCG tablet Take 100 mcg by mouth every morning.   metoprolol succinate (TOPROL-XL) 25 MG 24 hr tablet Take 0.5 tablets (12.5 mg total) by mouth in the morning and at bedtime.  mupirocin ointment (BACTROBAN) 2 % Apply 1 application topically daily. To any open sores or crusted areas of legs   pantoprazole (PROTONIX) 20 MG tablet Take 40 mg by mouth daily.    torsemide (DEMADEX) 20 MG tablet Take 20 mg by mouth 2 (two) times daily.     Allergies:   Penicillins   Social History   Socioeconomic History   Marital status: Widowed    Spouse name: Not on file   Number of children: Not on file   Years of education: Not on file   Highest education level: Not on file  Occupational History   Not on file  Tobacco Use   Smoking status: Former    Pack years: 0.00   Smokeless tobacco: Current    Types: Snuff  Vaping Use   Vaping Use: Never used  Substance and Sexual Activity   Alcohol use: No   Drug use: No   Sexual activity: Not on file  Other Topics Concern   Not on file  Social History Narrative   Not on file   Social Determinants of Health   Financial Resource Strain: Not on file   Food Insecurity: Not on file  Transportation Needs: Not on file  Physical Activity: Not on file  Stress: Not on file  Social Connections: Not on file     Family History: The patient's family history includes Heart Problems in his father, mother, and sister.  ROS:   Please see the history of present illness.     All other systems reviewed and are negative.  EKGs/Labs/Other Studies Reviewed:    The following studies were reviewed today:  Echo 06/18/20  1. Left ventricular ejection fraction, by estimation, is 25 to 30%. The  left ventricle has severely decreased function. The left ventricle  demonstrates global hypokinesis. The left ventricular internal cavity size  was severely dilated. Left ventricular  diastolic parameters are indeterminate.   2. Faint echos are noted near the RV apex, most likely representing  artifact and/or prominent moderator band. However, focal wall thickening,  thrombus, or mass cannot be entirely excluded. Right ventricular systolic  function is moderately reduced. The  right ventricular size is severely enlarged. There is severely elevated  pulmonary artery systolic pressure.   3. Left atrial size was severely dilated.   4. Right atrial size was severely dilated.   5. The mitral valve is degenerative. Moderate to severe mitral valve  regurgitation.   6. Tricuspid valve regurgitation is severe.   7. The aortic valve is tricuspid. Aortic valve regurgitation is mild. No  aortic stenosis is present.   8. The inferior vena cava is dilated in size with <50% respiratory  variability, suggesting right atrial pressure of 15 mmHg   Cardiac monitoring 01/2020 Atrial Fibrillation occurred continuously (100% burden), ranging from 35-187 bpm (avg of 66 bpm). 5 Ventricular Tachycardia runs occurred, the run with the fastest interval lasting 4 beats with a max rate of 156 bpm, the longest lasting 15 beats with an avg rate of 124 bpm. Difficulty discerning atrial  activity making definitive diagnosis difficult to ascertain. Idioventricular Rhythm was present. Isolated VEs were occasional (3.6%, [id number]), VE Couplets were rare (<1.0%, 15), and no VE Triplets were present. Ventricular Bigeminy and Trigeminy were present.   US Carotid 12/2019 IMPRESSION: Minimal to moderate amount of bilateral atherosclerotic plaque, right subjectively greater than left, not resulting in a hemodynamically significant stenosis within either internal carotid artery.     Cardiac MRI 04/03/19 IMPRESSION: No CMR  evidence of infiltrative cardiomyopathy. No myocardial scarring or LGE. Hypokinetic left ventricle with visually estimated ejection fraction between 30-40%.   Cath / PCI: LHC/RHC 02/15/19 1. Non-flow-limiting coronary artery disease including a 60% ostial D2 stenosis. 2. Mildly elevated left ventricular filling pressures (LVEDP = 13 mm Hg). 3. Elevated right heart filling pressures; RA 11, RV 65/8, PA 61/26 (37), PCW 12, PVR 3.8 4. Moderate pulmonary hypertension. 5. Preserved cardiac output and index; FCO/CI 6.5/2.9, TDCO/CI 5.3/2.4   EP Procedures and Devices:  Successful DCCV 02/16/19  TEE 02/15/18 1. There is a no thrombus seen in the left atrium. 2. There is a no thrombus seen in the right atrium. 3. There is moderate posteriorly directed mitral regurgitation. 4. There is mild aortic regurgitation.  TTE 02/10/18 1. The left ventricle is mildly dilated in size with normal wall thickness. 2. The left ventricular systolic function is severely decreased, LVEF is visually estimated at 25-30%. 3. The mitral valve leaflets are mildly thickened with normal leaflet mobility. 4. There is moderate mitral valve regurgitation. 5. The aortic valve is trileaflet with mildly thickened leaflets with normal excursion. 6. There is mild aortic regurgitation. 7. The left atrium is moderately dilated in size. 8. The right ventricle is severely dilated in size, with  reduced systolic function. 9. There is severe tricuspid regurgitation. 10. There is severe pulmonary hypertension, estimated pulmonary artery systolic pressure is 77 mmHg. 11. The right atrium is severely dilated in size. 12. IVC size and inspiratory change suggest elevated right atrial pressure. (10-20 mmHg).  PET Stress 02/13/18 - Abnormal myocardial perfusion study - There is a small in size, mild in severity, nearly completely reversible defect involving the apical and apical anterior segments. This is consistent with scar with peri-infarct ischemia. - There is a small in size, mild in severity, partially reversible defect involving the basal inferolateral and basal inferolateral segments. This is consistent with scar and liminal peri-infarct ischemia. Cannot rule out artifact. - During stress: Global systolic function is severely reduced. The ejection fraction calculated at 26%. The left ventricle is dilated. Right ventricular chamber size is severely dilated. Right ventricular systolic function is reduced. - 3 vessel coronary calcifications are noted - Small bilateral pleural effusions and large volume ascites are noted - Incidentally noted is a 3 mm nodule in the right upper lobe (series 6, image 19). Comparison with outside films is recommended if available. If not available, then by Fleischner criteria consider a dedicated chest CT in 12 months if cancer risk factors ARE present  EKG:  EKG is ordered today.  The ekg ordered today demonstrates Afib, 96bpm, PVCs, RBBB, no significant changes  Recent Labs: 11/20/2019: ALT 13 12/25/2019: B Natriuretic Peptide 555.0; Magnesium 2.0 03/22/2020: Hemoglobin 12.3; Platelets 87 07/04/2020: BUN 29; Creatinine, Ser 1.60; Potassium 4.0; Sodium 139; TSH 6.680  Recent Lipid Panel No results found for: CHOL, TRIG, HDL, CHOLHDL, VLDL, LDLCALC, LDLDIRECT   Physical Exam:    VS:  BP 100/60 (BP Location: Right Arm, Patient Position: Sitting, Cuff Size:  Normal)   Pulse 96   Ht 6\' 1"  (1.854 m)   Wt 221 lb (100.2 kg)   BMI 29.16 kg/m     Wt Readings from Last 3 Encounters:  07/24/20 221 lb (100.2 kg)  07/04/20 221 lb 2 oz (100.3 kg)  06/19/20 214 lb (97.1 kg)     GEN: Well nourished, well developed in no acute distress HEENT: Normal NECK: + JVD; No carotid bruits LYMPHATICS: No lymphadenopathy CARDIAC: Irref Irreg, no  murmurs, rubs, gallops RESPIRATORY:  Clear to auscultation without rales, wheezing or rhonchi  ABDOMEN: full, non-tender, non-distended MUSCULOSKELETAL:  No edema; No deformity  SKIN: Warm and dry NEUROLOGIC:  Alert and oriented x 3 PSYCHIATRIC:  Normal affect   ASSESSMENT:    1. Sleep-disordered breathing   2. Permanent atrial fibrillation ([place])   3. Essential hypertension   4. NICM (nonischemic cardiomyopathy) ([place])   5. PVC (premature ventricular contraction)   6. Nonischemic cardiomyopathy ([place])   7. 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 Chronic systolic heart failure (HCC)    PLAN:    In order of problems listed above:  HFrEF NICM Pulmonary HTN Etiology for CM suspected 2/2 tachy-mediated vs alcohol-induced. Prior LHC with no significant disease. cMRI without infiltrative dz. Also with pulmonary HTN and willing to undergo sleep study. Weight at home has been stable 209-215lbs. He takes torsemide 20 mg twice daily on most days, unless he has a doctors appointment, then he only takes 1. Recommended those days he can double up in the morning. No LLE on exam and lungs clear. He underwent paracentesis 5/27 yielding 4.2L fluid. Has some abdominal fullness on exam with mild JVD, says he might require another paracentesis. May need to consider addition of spironolactone, however SBP 100. He takes metoprolol 12.5mg  BID. Will discuss with MD discontinuation of metoprolol for spironolactone. Will request labs from kidney doctors.  Continue daily weights, compression socks, leg elevation and low salt diet.   Nonosbtructive CAD by LHC No anginal symptoms reported. LHC at [facility] showed no evidence of obstructive CAD. EKG without ischemic changes. No ASA with Eliquis. Continue metoprolol and statin. May need to d/c BB for addition of spironolactone in the future.   Permanent Afib/NSVT/PVCs EKG with rate controlled Afib. EKG with less frequent PVCs today. Prior history of ablation. Digoxin discontinued in the past due to kidney dysfunction and amiodarone not ideal given liver cirrhosis. Continue rate control with metoprolol. Reports compliance with Eliquis. He has denied EP referral in the past. Can revisit at follow-up.   CKD Following with [facility], appointment next week.   Multiple Myloma Following with oncology. cMRI without infiltrative disease.   Cirrhosis Following with IM/GI/oncology. He underwent paracentesis 5/27 yielding 4.2L. May need repeat paracentesis in the future. Will need to discuss addition of spironolactone with MD as above.  Prior Alcohol/tobacco use Reports cessation 8 years ago  Suspected sleep apnea He was unable to download [facility] APP. Will refer to pulmonology for further testing.  Disposition: Follow up in 2 month(s) with Dr. Rockey Situ   Signed, Emmersen Garraway Ninfa Meeker, PA-C  07/24/2020 12:23 PM    [place]

## 2020-07-24 ENCOUNTER — Ambulatory Visit: Payer: PPO | Admitting: Medical

## 2020-07-24 ENCOUNTER — Other Ambulatory Visit: Payer: Self-pay

## 2020-07-24 ENCOUNTER — Encounter: Payer: Self-pay | Admitting: Medical

## 2020-07-24 VITALS — BP 100/60 | HR 96 | Ht 73.0 in | Wt 221.0 lb

## 2020-07-24 DIAGNOSIS — D472 Monoclonal gammopathy: Secondary | ICD-10-CM

## 2020-07-24 DIAGNOSIS — G4730 Sleep apnea, unspecified: Secondary | ICD-10-CM | POA: Diagnosis not present

## 2020-07-24 DIAGNOSIS — R8290 Unspecified abnormal findings in urine: Secondary | ICD-10-CM | POA: Diagnosis not present

## 2020-07-24 DIAGNOSIS — R809 Proteinuria, unspecified: Secondary | ICD-10-CM | POA: Diagnosis not present

## 2020-07-24 DIAGNOSIS — I509 Heart failure, unspecified: Secondary | ICD-10-CM | POA: Diagnosis not present

## 2020-07-24 DIAGNOSIS — C9000 Multiple myeloma not having achieved remission: Secondary | ICD-10-CM

## 2020-07-24 DIAGNOSIS — I4821 Permanent atrial fibrillation: Secondary | ICD-10-CM | POA: Diagnosis not present

## 2020-07-24 DIAGNOSIS — I5022 Chronic systolic (congestive) heart failure: Secondary | ICD-10-CM | POA: Diagnosis not present

## 2020-07-24 DIAGNOSIS — N179 Acute kidney failure, unspecified: Secondary | ICD-10-CM | POA: Diagnosis not present

## 2020-07-24 DIAGNOSIS — I428 Other cardiomyopathies: Secondary | ICD-10-CM

## 2020-07-24 DIAGNOSIS — I25118 Atherosclerotic heart disease of native coronary artery with other forms of angina pectoris: Secondary | ICD-10-CM | POA: Diagnosis not present

## 2020-07-24 DIAGNOSIS — I10 Essential (primary) hypertension: Secondary | ICD-10-CM | POA: Diagnosis not present

## 2020-07-24 DIAGNOSIS — I493 Ventricular premature depolarization: Secondary | ICD-10-CM | POA: Diagnosis not present

## 2020-07-24 DIAGNOSIS — D631 Anemia in chronic kidney disease: Secondary | ICD-10-CM | POA: Diagnosis not present

## 2020-07-24 NOTE — Patient Instructions (Signed)
Medication Instructions:  - Your physician recommends that you continue on your current medications as directed. Please refer to the Current Medication list given to you today.  *If you need a refill on your cardiac medications before your next appointment, please call your pharmacy*   Lab Work: - none ordered  If you have labs (blood work) drawn today and your tests are completely normal, you will receive your results only by: [place] (if you have MyChart) OR A paper copy in the mail If you have any lab test that is abnormal or we need to change your treatment, we will call you to review the results.   Testing/Procedures: - You have been referred to : [facility]- Dr. Raul Del- Sleep consultation (Dr. Gust Brooms office should call you directly to schedule)   Follow-Up: At [facility], you and your health needs are our priority.  As part of our continuing mission to provide you with exceptional heart care, we have created designated Provider Care Teams.  These Care Teams include your primary Cardiologist (physician) and Advanced Practice Providers (APPs -  Physician Assistants and Nurse Practitioners) who all work together to provide you with the care you need, when you need it.  We recommend signing up for the patient portal called "MyChart".  Sign up information is provided on this After Visit Summary.  MyChart is used to connect with patients for Virtual Visits (Telemedicine).  Patients are able to view lab/test results, encounter notes, upcoming appointments, etc.  Non-urgent messages can be sent to your provider as well.   To learn more about what you can do with MyChart, go to [email or web address].    Your next appointment:   2 month(s)  The format for your next appointment:   In Person  Provider:   Ida Rogue, MD   Other Instructions N/a

## 2020-07-31 DIAGNOSIS — K7031 Alcoholic cirrhosis of liver with ascites: Secondary | ICD-10-CM | POA: Diagnosis not present

## 2020-07-31 DIAGNOSIS — N179 Acute kidney failure, unspecified: Secondary | ICD-10-CM | POA: Diagnosis not present

## 2020-07-31 DIAGNOSIS — G250 Essential tremor: Secondary | ICD-10-CM | POA: Diagnosis not present

## 2020-07-31 DIAGNOSIS — N189 Chronic kidney disease, unspecified: Secondary | ICD-10-CM | POA: Diagnosis not present

## 2020-07-31 DIAGNOSIS — E871 Hypo-osmolality and hyponatremia: Secondary | ICD-10-CM | POA: Diagnosis not present

## 2020-07-31 DIAGNOSIS — K7290 Hepatic failure, unspecified without coma: Secondary | ICD-10-CM | POA: Diagnosis not present

## 2020-07-31 DIAGNOSIS — N1832 Chronic kidney disease, stage 3b: Secondary | ICD-10-CM | POA: Diagnosis not present

## 2020-07-31 DIAGNOSIS — E785 Hyperlipidemia, unspecified: Secondary | ICD-10-CM | POA: Diagnosis not present

## 2020-07-31 DIAGNOSIS — I509 Heart failure, unspecified: Secondary | ICD-10-CM | POA: Diagnosis not present

## 2020-07-31 DIAGNOSIS — R809 Proteinuria, unspecified: Secondary | ICD-10-CM | POA: Diagnosis not present

## 2020-07-31 DIAGNOSIS — I10 Essential (primary) hypertension: Secondary | ICD-10-CM | POA: Diagnosis not present

## 2020-07-31 DIAGNOSIS — D631 Anemia in chronic kidney disease: Secondary | ICD-10-CM | POA: Diagnosis not present

## 2020-08-02 ENCOUNTER — Inpatient Hospital Stay: Payer: PPO | Attending: Oncology

## 2020-08-02 ENCOUNTER — Other Ambulatory Visit: Payer: Self-pay

## 2020-08-02 DIAGNOSIS — D696 Thrombocytopenia, unspecified: Secondary | ICD-10-CM | POA: Insufficient documentation

## 2020-08-02 DIAGNOSIS — D472 Monoclonal gammopathy: Secondary | ICD-10-CM

## 2020-08-02 DIAGNOSIS — C9000 Multiple myeloma not having achieved remission: Secondary | ICD-10-CM | POA: Diagnosis not present

## 2020-08-02 LAB — CBC WITH DIFFERENTIAL/PLATELET
Abs Immature Granulocytes: 0.06 10*3/uL (ref 0.00–0.07)
Basophils Absolute: 0 10*3/uL (ref 0.0–0.1)
Basophils Relative: 1 %
Eosinophils Absolute: 0 10*3/uL (ref 0.0–0.5)
Eosinophils Relative: 1 %
HCT: 39.9 % (ref 39.0–52.0)
Hemoglobin: 12.6 g/dL — ABNORMAL LOW (ref 13.0–17.0)
Immature Granulocytes: 1 %
Lymphocytes Relative: 16 %
Lymphs Abs: 0.8 10*3/uL (ref 0.7–4.0)
MCH: 33.2 pg (ref 26.0–34.0)
MCHC: 31.6 g/dL (ref 30.0–36.0)
MCV: 105.3 fL — ABNORMAL HIGH (ref 80.0–100.0)
Monocytes Absolute: 0.7 10*3/uL (ref 0.1–1.0)
Monocytes Relative: 15 %
Neutro Abs: 3.3 10*3/uL (ref 1.7–7.7)
Neutrophils Relative %: 66 %
Platelets: 112 10*3/uL — ABNORMAL LOW (ref 150–400)
RBC: 3.79 MIL/uL — ABNORMAL LOW (ref 4.22–5.81)
RDW: 19.9 % — ABNORMAL HIGH (ref 11.5–15.5)
WBC: 4.9 10*3/uL (ref 4.0–10.5)
nRBC: 0.6 % — ABNORMAL HIGH (ref 0.0–0.2)

## 2020-08-02 LAB — COMPREHENSIVE METABOLIC PANEL
ALT: 12 U/L (ref 0–44)
AST: 34 U/L (ref 15–41)
Albumin: 3.3 g/dL — ABNORMAL LOW (ref 3.5–5.0)
Alkaline Phosphatase: 139 U/L — ABNORMAL HIGH (ref 38–126)
Anion gap: 11 (ref 5–15)
BUN: 38 mg/dL — ABNORMAL HIGH (ref 8–23)
CO2: 23 mmol/L (ref 22–32)
Calcium: 9 mg/dL (ref 8.9–10.3)
Chloride: 104 mmol/L (ref 98–111)
Creatinine, Ser: 2.25 mg/dL — ABNORMAL HIGH (ref 0.61–1.24)
GFR, Estimated: 31 mL/min — ABNORMAL LOW (ref >60)
Glucose, Bld: 113 mg/dL — ABNORMAL HIGH (ref 70–99)
Potassium: 3.8 mmol/L (ref 3.5–5.1)
Sodium: 138 mmol/L (ref 135–145)
Total Bilirubin: 2.8 mg/dL — ABNORMAL HIGH (ref 0.3–1.2)
Total Protein: 8.2 g/dL — ABNORMAL HIGH (ref 6.5–8.1)

## 2020-08-03 LAB — IGG, IGA, IGM
IgA: 505 mg/dL — ABNORMAL HIGH (ref 61–437)
IgG (Immunoglobin G), Serum: 2974 mg/dL — ABNORMAL HIGH (ref 603–1613)
IgM (Immunoglobulin M), Srm: 112 mg/dL (ref 20–172)

## 2020-08-05 DIAGNOSIS — K219 Gastro-esophageal reflux disease without esophagitis: Secondary | ICD-10-CM | POA: Diagnosis not present

## 2020-08-05 DIAGNOSIS — N189 Chronic kidney disease, unspecified: Secondary | ICD-10-CM | POA: Diagnosis not present

## 2020-08-05 DIAGNOSIS — U071 COVID-19: Secondary | ICD-10-CM | POA: Diagnosis not present

## 2020-08-05 DIAGNOSIS — G629 Polyneuropathy, unspecified: Secondary | ICD-10-CM | POA: Diagnosis not present

## 2020-08-05 DIAGNOSIS — R197 Diarrhea, unspecified: Secondary | ICD-10-CM | POA: Diagnosis not present

## 2020-08-05 DIAGNOSIS — I2510 Atherosclerotic heart disease of native coronary artery without angina pectoris: Secondary | ICD-10-CM | POA: Diagnosis not present

## 2020-08-05 DIAGNOSIS — M25522 Pain in left elbow: Secondary | ICD-10-CM | POA: Diagnosis not present

## 2020-08-05 DIAGNOSIS — N179 Acute kidney failure, unspecified: Secondary | ICD-10-CM | POA: Diagnosis not present

## 2020-08-05 DIAGNOSIS — D472 Monoclonal gammopathy: Secondary | ICD-10-CM | POA: Diagnosis not present

## 2020-08-05 DIAGNOSIS — R531 Weakness: Secondary | ICD-10-CM | POA: Diagnosis not present

## 2020-08-05 DIAGNOSIS — I420 Dilated cardiomyopathy: Secondary | ICD-10-CM | POA: Diagnosis not present

## 2020-08-05 DIAGNOSIS — K7031 Alcoholic cirrhosis of liver with ascites: Secondary | ICD-10-CM | POA: Diagnosis not present

## 2020-08-05 DIAGNOSIS — L299 Pruritus, unspecified: Secondary | ICD-10-CM | POA: Diagnosis not present

## 2020-08-05 DIAGNOSIS — I426 Alcoholic cardiomyopathy: Secondary | ICD-10-CM | POA: Diagnosis not present

## 2020-08-05 DIAGNOSIS — L89152 Pressure ulcer of sacral region, stage 2: Secondary | ICD-10-CM | POA: Diagnosis not present

## 2020-08-05 DIAGNOSIS — R161 Splenomegaly, not elsewhere classified: Secondary | ICD-10-CM | POA: Diagnosis not present

## 2020-08-05 DIAGNOSIS — I4819 Other persistent atrial fibrillation: Secondary | ICD-10-CM | POA: Diagnosis not present

## 2020-08-05 DIAGNOSIS — R778 Other specified abnormalities of plasma proteins: Secondary | ICD-10-CM | POA: Diagnosis not present

## 2020-08-05 DIAGNOSIS — K7460 Unspecified cirrhosis of liver: Secondary | ICD-10-CM | POA: Diagnosis not present

## 2020-08-05 DIAGNOSIS — E039 Hypothyroidism, unspecified: Secondary | ICD-10-CM | POA: Diagnosis not present

## 2020-08-05 DIAGNOSIS — I248 Other forms of acute ischemic heart disease: Secondary | ICD-10-CM | POA: Diagnosis not present

## 2020-08-05 DIAGNOSIS — R0602 Shortness of breath: Secondary | ICD-10-CM | POA: Diagnosis not present

## 2020-08-05 DIAGNOSIS — I130 Hypertensive heart and chronic kidney disease with heart failure and stage 1 through stage 4 chronic kidney disease, or unspecified chronic kidney disease: Secondary | ICD-10-CM | POA: Diagnosis not present

## 2020-08-05 DIAGNOSIS — J3489 Other specified disorders of nose and nasal sinuses: Secondary | ICD-10-CM | POA: Diagnosis not present

## 2020-08-05 DIAGNOSIS — K766 Portal hypertension: Secondary | ICD-10-CM | POA: Diagnosis not present

## 2020-08-05 DIAGNOSIS — I5023 Acute on chronic systolic (congestive) heart failure: Secondary | ICD-10-CM | POA: Diagnosis not present

## 2020-08-05 DIAGNOSIS — R944 Abnormal results of kidney function studies: Secondary | ICD-10-CM | POA: Diagnosis not present

## 2020-08-05 DIAGNOSIS — E876 Hypokalemia: Secondary | ICD-10-CM | POA: Diagnosis not present

## 2020-08-05 DIAGNOSIS — H5789 Other specified disorders of eye and adnexa: Secondary | ICD-10-CM | POA: Diagnosis not present

## 2020-08-05 DIAGNOSIS — Z880 Allergy status to penicillin: Secondary | ICD-10-CM | POA: Diagnosis not present

## 2020-08-05 DIAGNOSIS — J811 Chronic pulmonary edema: Secondary | ICD-10-CM | POA: Diagnosis not present

## 2020-08-05 DIAGNOSIS — G928 Other toxic encephalopathy: Secondary | ICD-10-CM | POA: Diagnosis not present

## 2020-08-05 DIAGNOSIS — L298 Other pruritus: Secondary | ICD-10-CM | POA: Diagnosis not present

## 2020-08-05 DIAGNOSIS — I4510 Unspecified right bundle-branch block: Secondary | ICD-10-CM | POA: Diagnosis not present

## 2020-08-05 LAB — PROTEIN ELECTROPHORESIS, SERUM
A/G Ratio: 0.7 (ref 0.7–1.7)
Albumin ELP: 3.4 g/dL (ref 2.9–4.4)
Alpha-1-Globulin: 0.3 g/dL (ref 0.0–0.4)
Alpha-2-Globulin: 0.6 g/dL (ref 0.4–1.0)
Beta Globulin: 1.1 g/dL (ref 0.7–1.3)
Gamma Globulin: 2.7 g/dL — ABNORMAL HIGH (ref 0.4–1.8)
Globulin, Total: 4.7 g/dL — ABNORMAL HIGH (ref 2.2–3.9)
M-Spike, %: 1.9 g/dL — ABNORMAL HIGH
Total Protein ELP: 8.1 g/dL (ref 6.0–8.5)

## 2020-08-05 LAB — KAPPA/LAMBDA LIGHT CHAINS
Kappa free light chain: 1149.2 mg/L — ABNORMAL HIGH (ref 3.3–19.4)
Kappa, lambda light chain ratio: 24.3 — ABNORMAL HIGH (ref 0.26–1.65)
Lambda free light chains: 47.3 mg/L — ABNORMAL HIGH (ref 5.7–26.3)

## 2020-08-06 NOTE — Progress Notes (Deleted)
[place]  [phone] [phone]  ID: SEABORN NAKAMA OB: 09/06/1950  MR#: [id number]  KHT#:[id number]  Patient Care Team: Albina Billet, MD as PCP - General (Internal Medicine) Minna Merritts, MD as PCP - Cardiology (Cardiology) Lloyd Huger, MD as Consulting Physician (Oncology)  CHIEF COMPLAINT: Smoldering myeloma.  INTERVAL HISTORY: Patient returns to clinic for further evaluation and discussion of his laboratory work. He currently feel well. He has no neurologic complaints. He denies any recent fevers or illnesses. He has a fair appetite.  He denies any chest pain, shortness of breath, cough, or hemoptysis.  He denies any nausea, vomiting, constipation, or diarrhea.  He has no melena or hematochezia.  He has no urinary complaints. His peripheral edema has nearly resolved. Patient offers no further specific complaints.   REVIEW OF SYSTEMS:   Review of Systems  Constitutional:  Positive for malaise/fatigue. Negative for fever.  Respiratory: Negative.  Negative for cough, hemoptysis and shortness of breath.   Cardiovascular: Negative.  Negative for chest pain and leg swelling.  Gastrointestinal: Negative.  Negative for abdominal pain.  Genitourinary: Negative.  Negative for dysuria.  Musculoskeletal: Negative.  Negative for back pain.  Skin: Negative.  Negative for rash.  Neurological:  Positive for weakness. Negative for dizziness, focal weakness and headaches.  Psychiatric/Behavioral: Negative.  The patient is not nervous/anxious.    As per HPI. Otherwise, a complete review of systems is negative.  PAST MEDICAL HISTORY: Past Medical History:  Diagnosis Date   (HFpEF) heart failure with preserved ejection fraction ([place])    a. 03/2019 cMRI: EF 30-40%, glob HK; b. 05/2019 Echo [facility]): EF 35%.   CHF (congestive heart failure) (HCC)    CKD (chronic kidney disease), stage III (HCC)    Coumadin resistance (HCC)    Diabetes mellitus  without complication ([employer])    Dysrhythmia    A. Fib   Fatty liver    H/O ETOH abuse    Hepatic encephalopathy (HCC)    history   Hx of adenomatous polyp of colon    Hx of thrombocytopenia    Hypertension    Hypothyroidism    Macrocytic anemia    Mixed hyperlipidemia    NICM (nonischemic cardiomyopathy) ([place])    a. 03/2019 cMRI: EF 30-40%, glob HK. No LGE. No evidence of infiltrative cardiomyopathy; b. 05/2019 Echo [facility]): EF 35%, mild to mod MR, sev dil LA, sev dil RV w/ mod reduced RV fxn. Sev TR. Mild-mod PAH (PASP 22mHg), sev dil RA.   Nonobstructive CAD (coronary artery disease)    a. 02/2019 Cath ([facility]: LM nl, LAD 443mD1 small, D2 60ost, D3 small, LCX nl, OM2 25, RCA 4027m5d.   Persistent atrial fibrillation ([place]  a. CHA2DS2VASc = 5-->eliquis; b. [facility]2019 [facility]  Valvular heart disease    a. [facility], sev TR.    PAST SURGICAL HISTORY: Past Surgical History:  Procedure Laterality Date   CARDIAC CATHETERIZATION     no stents   COLONOSCOPY     COLONOSCOPY WITH PROPOFOL N/A 07/26/2015   Procedure: COLONOSCOPY WITH PROPOFOL;  Surgeon: RobManya SilvasD;  [facility];  Service: Endoscopy;  Laterality: N/A;   COLONOSCOPY WITH PROPOFOL N/A 10/03/2019   Procedure: COLONOSCOPY WITH PROPOFOL;  Surgeon: LocLesly RubensteinD;  Location: [facility] ENDOSCOPY;  Service: Endoscopy;  Laterality: N/A;   ESOPHAGOGASTRODUODENOSCOPY (EGD) WITH PROPOFOL N/A 10/03/2019   Procedure: ESOPHAGOGASTRODUODENOSCOPY (EGD) WITH PROPOFOL;  Surgeon: Lesly Rubenstein, MD;  Location: [facility] ENDOSCOPY;  Service: Endoscopy;  Laterality: N/A;   HAMMER TOE SURGERY      FAMILY HISTORY: Family History  Problem Relation Age of Onset   Heart Problems Mother    Heart Problems Father    Heart Problems Sister     ADVANCED DIRECTIVES (Y/N):  N  HEALTH MAINTENANCE: Social History   Tobacco Use   Smoking status: Former   Smokeless tobacco: Current    Types:  Snuff  Vaping Use   Vaping Use: Never used  Substance Use Topics   Alcohol use: No   Drug use: No     Colonoscopy:  PAP:  Bone density:  Lipid panel:  Allergies  Allergen Reactions   Penicillins Hives and Rash    Reaction to PCN injection age 70, Tolerates PO PCN    Current Outpatient Medications  Medication Sig Dispense Refill   apixaban (ELIQUIS) 5 MG TABS tablet Take by mouth 2 (two) times daily.      atorvastatin (LIPITOR) 10 MG tablet Take 10 mg by mouth daily.     clobetasol cream (TEMOVATE) 3.84 % Apply 1 application topically 2 (two) times daily.     colchicine 0.6 MG tablet Take 0.6 mg by mouth 2 (two) times daily.      gabapentin (NEURONTIN) 100 MG capsule Take 100 mg by mouth 3 (three) times daily.     lactulose (CHRONULAC) 10 GM/15ML solution Take 10 g by mouth every other day.     levothyroxine (SYNTHROID, LEVOTHROID) 100 MCG tablet Take 100 mcg by mouth every morning.  4   metoprolol succinate (TOPROL-XL) 25 MG 24 hr tablet Take 0.5 tablets (12.5 mg total) by mouth in the morning and at bedtime. 90 tablet 3   mupirocin ointment (BACTROBAN) 2 % Apply 1 application topically daily. To any open sores or crusted areas of legs 22 g 1   pantoprazole (PROTONIX) 20 MG tablet Take 40 mg by mouth daily.      torsemide (DEMADEX) 20 MG tablet Take 20 mg by mouth 2 (two) times daily.     No current facility-administered medications for this visit.    OBJECTIVE: There were no vitals filed for this visit.    There is no height or weight on file to calculate BMI.    ECOG FS:1 - Symptomatic but completely ambulatory  General: Well-developed, well-nourished, no acute distress. Eyes: Pink conjunctiva, anicteric sclera. HEENT: Normocephalic, moist mucous membranes. Lungs: No audible wheezing or coughing. Heart: Regular rate and rhythm. Abdomen: Soft, nontender, no obvious distention. Musculoskeletal: No edema, cyanosis, or clubbing. Neuro: Alert, answering all questions  appropriately. Cranial nerves grossly intact. Skin: No rashes or petechiae noted. Psych: Normal affect.   LAB RESULTS:  Lab Results  Component Value Date   NA 138 08/02/2020   K 3.8 08/02/2020   CL 104 08/02/2020   CO2 23 08/02/2020   GLUCOSE 113 (H) 08/02/2020   BUN 38 (H) 08/02/2020   CREATININE 2.25 (H) 08/02/2020   CALCIUM 9.0 08/02/2020   PROT 8.2 (H) 08/02/2020   ALBUMIN 3.3 (L) 08/02/2020   AST 34 08/02/2020   ALT 12 08/02/2020   ALKPHOS 139 (H) 08/02/2020   BILITOT 2.8 (H) 08/02/2020   GFRNONAA 31 (L) 08/02/2020   GFRAA 47 (L) 08/02/2019    Lab Results  Component Value Date   WBC 4.9 08/02/2020   NEUTROABS 3.3 08/02/2020   HGB 12.6 (L) 08/02/2020   HCT 39.9 08/02/2020   MCV 105.3 (  H) 08/02/2020   PLT 112 (L) 08/02/2020   Lab Results  Component Value Date   TOTALPROTELP 8.1 08/02/2020   ALBUMINELP 3.4 08/02/2020   A1GS 0.3 08/02/2020   A2GS 0.6 08/02/2020   BETS 1.1 08/02/2020   GAMS 2.7 (H) 08/02/2020   MSPIKE 1.9 (H) 08/02/2020   SPEI Comment 08/02/2020      STUDIES: CT ABDOMEN PELVIS WO CONTRAST  Result Date: 07/11/2020 CLINICAL DATA:  History of cirrhosis, follow-up prior CT. EXAM: CT ABDOMEN AND PELVIS WITHOUT CONTRAST TECHNIQUE: Multidetector CT imaging of the abdomen and pelvis was performed following the standard protocol without IV contrast. COMPARISON:  CT October 16, 2019 FINDINGS: Lower chest: No acute abnormality. Four-chamber cardiac enlargement. Coronary artery calcifications. No significant pericardial effusion/thickening. Hepatobiliary: Cirrhotic hepatic morphology. Punctate calcifications in the lower right hepatic lobe, unchanged from prior. Gallbladder is unremarkable. No biliary ductal dilation. Pancreas: Within normal limits. Spleen: Stable calcifications along the lateral border of the spleen, likely sequela of prior insult/trauma. Mild splenomegaly measuring 15.4 cm in maximum axial dimension. Adrenals/Urinary Tract: Bilateral  adrenal glands are unremarkable. No hydronephrosis. No nephrolithiasis. No contour deforming renal masses. Urinary bladder is predominantly decompressed otherwise unremarkable. Stomach/Bowel: Radiopaque enteric contrast traverses the distal small bowel. The appendix is not definitely visualized however there is no evidence of acute appendicitis. No pathologic dilation of small bowel. Colonic bowel loops are predominantly decompressed limiting evaluation. A normal appearing appendix and small amount of nonobstructed small bowel are located within a moderate fat containing inguinal hernia. Vascular/Lymphatic: Aortic and branch vessel atherosclerosis without aneurysmal dilation. Stable short segment aortic dissection disc proximal to the bifurcation on image 41/2. No pathologically enlarged abdominal or pelvic lymph nodes visualized. Reproductive: Prostate is unremarkable. Other: Large fat containing left inguinal hernia. Moderate-sized right inguinal hernia containing fat, fluid, normal appearing appendix and nonobstructed loop of small bowel. Moderate volume of ascites. Musculoskeletal: Multilevel degenerative change of the spine multiple Schmorl's nodes including a large Schmorl's node in the superior L4 endplate. Degenerative change of the hips and SI joints. IMPRESSION: 1. Cirrhotic hepatic morphology with evidence of portal hypertension including mild splenomegaly and moderate volume of ascites. 2. Moderate-sized right inguinal hernia containing fat, fluid, normal appearing appendix and small amount of nonobstructed small bowel. 3. Large fat containing left inguinal hernia. 4. No suspicious gastric wall thickening, if continued clinical concern recommend further evaluation with EUS. 5.  Aortic Atherosclerosis (ICD10-I70.0). Electronically Signed   By: Dahlia Bailiff MD   On: 07/11/2020 13:41     ASSESSMENT: Smoldering myeloma  PLAN:    1.  Smoldering myeloma: Bone marrow biopsy completed at [facility] on February 14, 2019 revealed approximately 26% plasma cells in aspirate.  FISH and cytogenetics are unknown. Metastatic bone survey on January 09, 2019 did not reveal any suspicious bony lesions. His most recent M spike and IgG component are elevated, but essentially unchanged at 2.6 and 3017 respectively. Kappa free light chains have trended up to 1119.6. His platelet count and hemoglobin have both improved. Can consider PET scan in the near future to look for occult lesions otherwise, no intervention is needed at this time. No treatment is needed at this time, but patient expressed understanding that he may require intervention in the future. Return to clinic in 4 months for repeat laboratory work and further evaluation.  2.  Thrombocytopenia: Improved to 124.  Patient noted to have a decreased platelet count since at least July 2017. Monitor. 3.  Hyperbilirubinemia: Patient's most recent laboratory work at  an outside facility revealed a bilirubin of 1.8.  Unclear etiology.  Continue to monitor. 4.  Anemia: Hemoglobin improved to 12.1. 5.  Renal insufficiency: Chronic and unchanged. 6.  Cardiac disease: Patient has elected for medical management only. Continue follow-up and treatment per cardiology.    Patient expressed understanding and was in agreement with this plan. He also understands that He can call clinic at any time with any questions, concerns, or complaints.    Lloyd Huger, MD   08/06/2020 10:12 PM

## 2020-08-08 ENCOUNTER — Inpatient Hospital Stay: Payer: PPO | Admitting: Oncology

## 2020-08-08 DIAGNOSIS — C9000 Multiple myeloma not having achieved remission: Secondary | ICD-10-CM

## 2020-08-15 ENCOUNTER — Ambulatory Visit: Payer: PPO | Admitting: Dermatology

## 2020-08-15 DIAGNOSIS — I130 Hypertensive heart and chronic kidney disease with heart failure and stage 1 through stage 4 chronic kidney disease, or unspecified chronic kidney disease: Secondary | ICD-10-CM | POA: Diagnosis not present

## 2020-08-15 DIAGNOSIS — I4819 Other persistent atrial fibrillation: Secondary | ICD-10-CM | POA: Diagnosis not present

## 2020-08-15 DIAGNOSIS — K219 Gastro-esophageal reflux disease without esophagitis: Secondary | ICD-10-CM | POA: Diagnosis not present

## 2020-08-15 DIAGNOSIS — Z9181 History of falling: Secondary | ICD-10-CM | POA: Diagnosis not present

## 2020-08-15 DIAGNOSIS — D472 Monoclonal gammopathy: Secondary | ICD-10-CM | POA: Diagnosis not present

## 2020-08-15 DIAGNOSIS — Z7901 Long term (current) use of anticoagulants: Secondary | ICD-10-CM | POA: Diagnosis not present

## 2020-08-15 DIAGNOSIS — N189 Chronic kidney disease, unspecified: Secondary | ICD-10-CM | POA: Diagnosis not present

## 2020-08-15 DIAGNOSIS — E039 Hypothyroidism, unspecified: Secondary | ICD-10-CM | POA: Diagnosis not present

## 2020-08-15 DIAGNOSIS — I420 Dilated cardiomyopathy: Secondary | ICD-10-CM | POA: Diagnosis not present

## 2020-08-15 DIAGNOSIS — Z7401 Bed confinement status: Secondary | ICD-10-CM | POA: Diagnosis not present

## 2020-08-15 DIAGNOSIS — K7030 Alcoholic cirrhosis of liver without ascites: Secondary | ICD-10-CM | POA: Diagnosis not present

## 2020-08-15 DIAGNOSIS — G629 Polyneuropathy, unspecified: Secondary | ICD-10-CM | POA: Diagnosis not present

## 2020-08-15 DIAGNOSIS — I5020 Unspecified systolic (congestive) heart failure: Secondary | ICD-10-CM | POA: Diagnosis not present

## 2020-08-15 DIAGNOSIS — U071 COVID-19: Secondary | ICD-10-CM | POA: Diagnosis not present

## 2020-08-16 ENCOUNTER — Telehealth: Payer: Self-pay | Admitting: Nurse Practitioner

## 2020-08-16 NOTE — Telephone Encounter (Signed)
Spoke with patient's son Shanon Brow, regarding the Palliative referral and scheduling a visit and he requested that I contact Waunita Schooner (girlfriend) to schedule the appointment.  I spoke with Vickii Chafe and after discussing the Palliative referral/services she stated that patient was aware of the referral and was in agreement with beginning our services.  I have scheduled an In-home Consult for 21-Sep-2020 @ 9 AM.

## 2020-08-20 DIAGNOSIS — U071 COVID-19: Secondary | ICD-10-CM | POA: Diagnosis not present

## 2020-08-20 DIAGNOSIS — Z7901 Long term (current) use of anticoagulants: Secondary | ICD-10-CM | POA: Diagnosis not present

## 2020-08-20 DIAGNOSIS — E039 Hypothyroidism, unspecified: Secondary | ICD-10-CM | POA: Diagnosis not present

## 2020-08-20 DIAGNOSIS — I130 Hypertensive heart and chronic kidney disease with heart failure and stage 1 through stage 4 chronic kidney disease, or unspecified chronic kidney disease: Secondary | ICD-10-CM | POA: Diagnosis not present

## 2020-08-20 DIAGNOSIS — Z9181 History of falling: Secondary | ICD-10-CM | POA: Diagnosis not present

## 2020-08-20 DIAGNOSIS — D472 Monoclonal gammopathy: Secondary | ICD-10-CM | POA: Diagnosis not present

## 2020-08-20 DIAGNOSIS — N189 Chronic kidney disease, unspecified: Secondary | ICD-10-CM | POA: Diagnosis not present

## 2020-08-20 DIAGNOSIS — I5020 Unspecified systolic (congestive) heart failure: Secondary | ICD-10-CM | POA: Diagnosis not present

## 2020-08-20 DIAGNOSIS — K7030 Alcoholic cirrhosis of liver without ascites: Secondary | ICD-10-CM | POA: Diagnosis not present

## 2020-08-20 DIAGNOSIS — Z7401 Bed confinement status: Secondary | ICD-10-CM | POA: Diagnosis not present

## 2020-08-20 DIAGNOSIS — K219 Gastro-esophageal reflux disease without esophagitis: Secondary | ICD-10-CM | POA: Diagnosis not present

## 2020-08-20 DIAGNOSIS — I420 Dilated cardiomyopathy: Secondary | ICD-10-CM | POA: Diagnosis not present

## 2020-08-20 DIAGNOSIS — G629 Polyneuropathy, unspecified: Secondary | ICD-10-CM | POA: Diagnosis not present

## 2020-08-20 DIAGNOSIS — I4819 Other persistent atrial fibrillation: Secondary | ICD-10-CM | POA: Diagnosis not present

## 2020-08-23 ENCOUNTER — Other Ambulatory Visit: Payer: Self-pay

## 2020-08-23 ENCOUNTER — Other Ambulatory Visit: Payer: PPO

## 2020-08-23 VITALS — BP 92/60 | HR 72 | Temp 97.4°F

## 2020-08-23 DIAGNOSIS — Z515 Encounter for palliative care: Secondary | ICD-10-CM

## 2020-08-23 NOTE — Progress Notes (Signed)
[facility] PALL[facility]ATIVE CARE SW NOTE  PATIENT NAME: [name] DOB: 27-Mar-1950 MRN: [id number]  PRIMARY CARE PROVIDER: Albina Billet, MD  RESPONSIBLE PARTY:  Acct ID - Guarantor Home Phone Work Phone Relationship Acct Type  [id number] [name], [phone] Self P/F     [address] [place], [place] [address]     PLAN OF CARE and INTERVENTIONS:              PC SW and RN completed joint visit with new PC patient. Patient, patient girlfriend [name] and patient son [name] present for visit.  Patient is 70 year old caucasian male with declining condition of cirrhosis of the liver and recent hospitalization due to [place]. Patient and family share that since hospitalization patient has been very weak and fatigued. Patient is currently total assist with all ADL's including eating.   Patient is afebrile, has poor appetite, and sleeps majority of the day. Patient requires MOD-MAX A with transfers and toileting.   Family shares that patient has bouts of confusion when he does not take his lactulose and ammonia levels are high. Patient presented with some confusion during visit. Patients girlfriend and son provide all care needs.   [facility] however, patient has not seen or ear from [facility] in nearly a week, SW made TC to [facility] to confirm status of care. Son shares that [facility] has outreached him to initiate hospice services as well, however patient and family are not ready for this.   Patient wishes are to be transported to the hospital if needed and receive full scope of treatment. Patient wants to attempt to continue PT. Patient is open to revisiting Hospice topic after therapy has ended.       SOCIAL HX:  Social History   Tobacco Use   Smoking status: Former   Smokeless tobacco: Current    Types: Snuff  Substance Use Topics   Alcohol use: No    CODE STATUS: Full code ADVANCED  DIRECTIVES: Y MOST FORM COMPLETE:  N HOSPICE EDUCATION PROVIDED: Y        [place], LCSW

## 2020-08-26 NOTE — Progress Notes (Signed)
PATIENT NAME: [name] DOB: 1950-07-25 MRN: [id number]  PRIMARY CARE PROVIDER: Albina Billet, MD  RESPONSIBLE PARTY:  Acct ID - Guarantor Home Phone Work Phone Relationship Acct Type  [id number] [name] Self P/F     [address], [place], [place] [address]    PLAN OF CARE and INTERVENTIONS:               1.  GOALS OF CARE/ ADVANCE CARE PLANNING:  Patient desires hospitalization if needed.  He does not wish to sign with hospice at this time but would like to try therapy.                2.  PATIENT/CAREGIVER EDUCATION:  Therapy, Palliative Care and Hospice.               4. PERSONAL EMERGENCY PLAN:  Activate 911 for emergencies.               5.  DISEASE STATUS:  Joint visit completed with [place], SW,  [place], [place] and patient.  Son voiced frustration over patient's situation.  [facility], [facility], an Palliative Care have all seen or are involved in patient's care since the hospital discharge.  Son states [facility] saw patient for PT/OT evaluation but he is uncertain when they will be back.  He began receiving calls from [facility] before patient discharged from the hospital and they are scheduled to see patient tomorrow for a hospice admission.  Son states the information given from the hospital and what the nurse from [facility] stated were conflicting.  He was told by [facility] that his father's life expectancy is 2-6 weeks.  They would like to try therapy first and if patient is unable to tolerate therapy they would consider hospice but would prefer a local hospice.   Patient was able to sit up on the edge of the bed.  He is drinking liquids wells.  Food intake has been poor with bites mostly.  Patient was able to eat 2-4 bites of a ham biscuit during this visit.  Am medications and lactulose were administered by Vickii Chafe.  Patient is displaying confusion throughout the visit. Lactulose was also given.  Family noted patient was having confusion prior to  hospital admission but once lactulose was administered confusion cleared up. Patient is using the bedpan and urinal.  There are some episodes of urinary incontinence.  Son will assist patient in getting into the wheelchair but he is mostly bed-bound.    SW contacted [facility] to advise of family decision to wait on hospice.  Several attempts made to reach [facility].  Message has been left and response pending.  Patient is scheduled to see the Palliative Care NP next week.  Update provided to Christin Gusler, NP.   HISTORY OF PRESENT ILLNESS:  70 year old male with Cirrhosis of the Liver.    CODE STATUS: Full ADVANCED DIRECTIVES: N MOST FORM: No PPS: 40%   PHYSICAL EXAM:   VITALS: Today's Vitals   08/26/20 0919  BP: 92/60  Pulse: 72  Temp: (!) 97.4 F (36.3 C)  SpO2: 95%    LUNGS: clear to auscultation  CARDIAC: Cor RRR}  EXTREMITIES: - for edema SKIN: Skin color, texture, turgor normal. No rashes or lesions or normal and no edema  NEURO: positive for gait problems, memory problems, and weakness       Lorenza Burton, RN

## 2020-08-27 ENCOUNTER — Telehealth: Payer: Self-pay

## 2020-08-27 NOTE — Telephone Encounter (Signed)
RN received message that son had called.  RN spoke with son on phone that states that patient has significantly declined since last week. No oral intake in a few days, decreased LOC and not doing well. Son in agreement with hospice services. RN called DR. Tate's office and received verbal hospice order for urgent evaluation. Notified referral intake for hospice and hospice MD

## 2020-08-28 ENCOUNTER — Other Ambulatory Visit: Payer: PPO | Admitting: Nurse Practitioner

## 2020-08-28 ENCOUNTER — Other Ambulatory Visit: Payer: Self-pay

## 2020-09-19 DEATH — deceased

## 2020-09-30 ENCOUNTER — Ambulatory Visit: Payer: PPO | Admitting: Cardiovascular Disease

## 2020-12-12 IMAGING — CT CT HEAD W/O CM
3 series · 16 of 47 positions shown, 19 images · non-contrast
Comparison: None.

CLINICAL DATA: Possible seizure.

EXAM:
CT HEAD WITHOUT CONTRAST
TECHNIQUE: Contiguous axial images were obtained from the base of the skull
through the vertex without intravenous contrast.

[Series 2: head wo · axial · 0.42mm/px · z∈[+525,+660]mm · 10 of 33 slices shown, 13 images]
[im 3/33  brain]
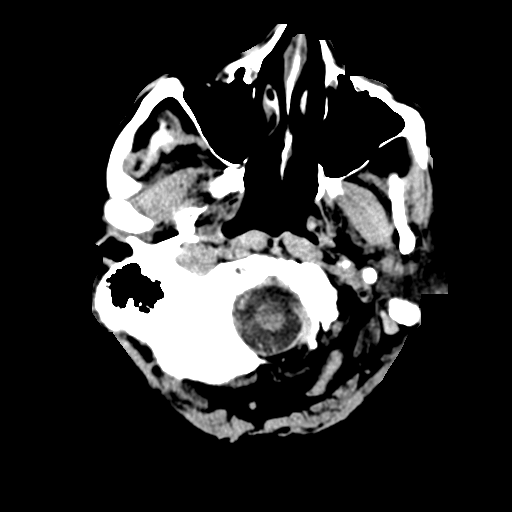
[im 3/33  bone]
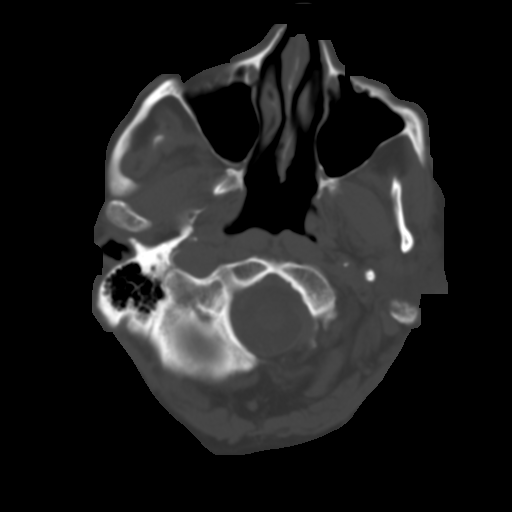
[im 6/33  brain]
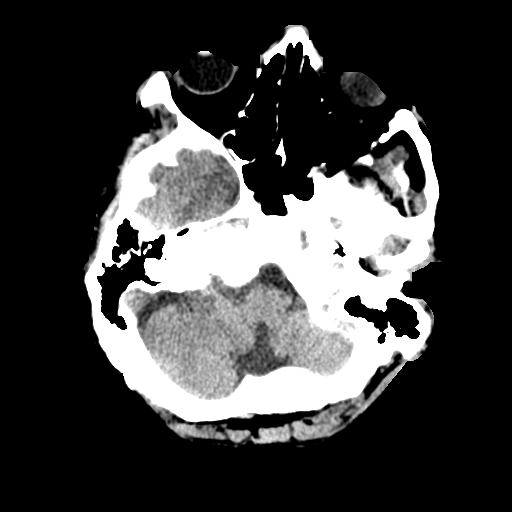
[im 9/33  brain]
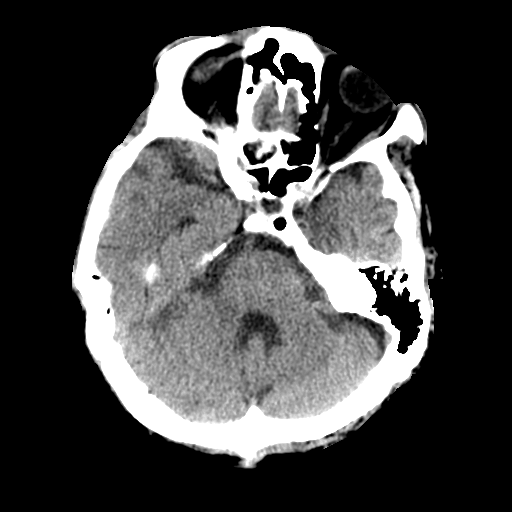
[im 12/33  brain]
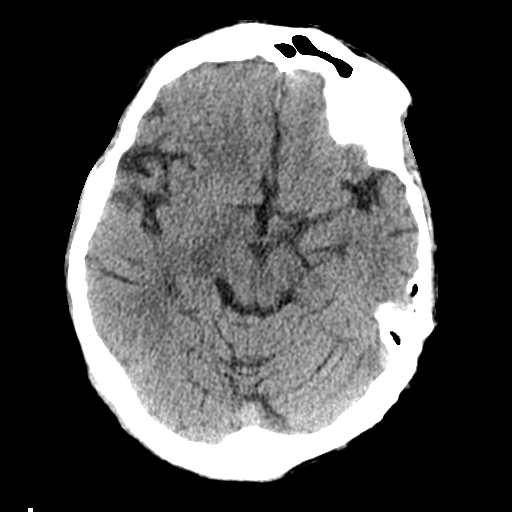
[im 15/33  brain]
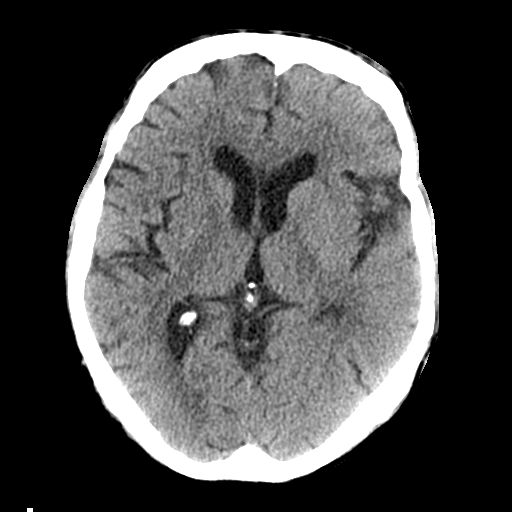
[im 15/33  bone]
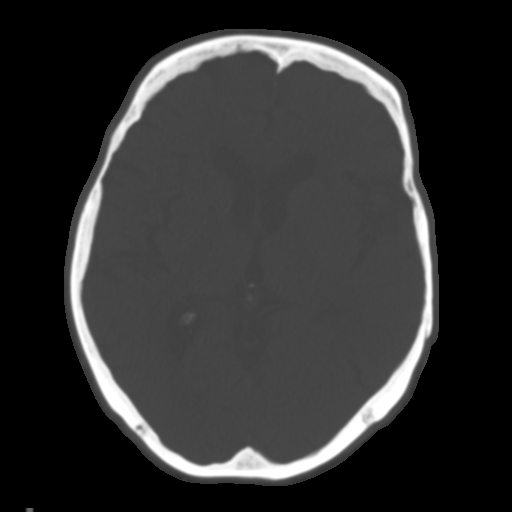
[im 18/33  brain]
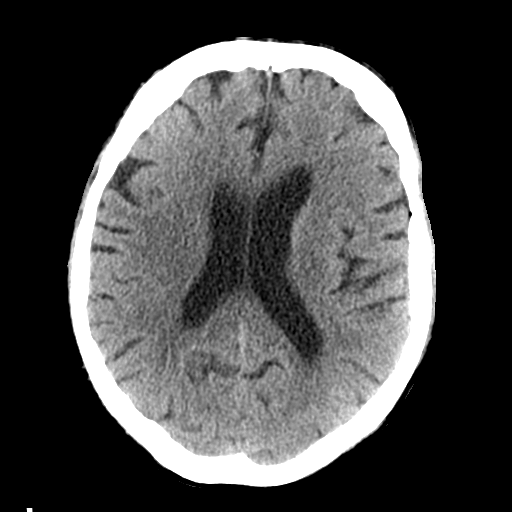
[im 21/33  brain]
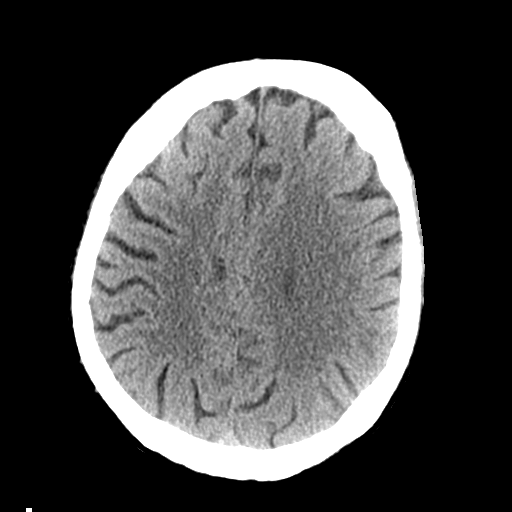
[im 25/33  brain]
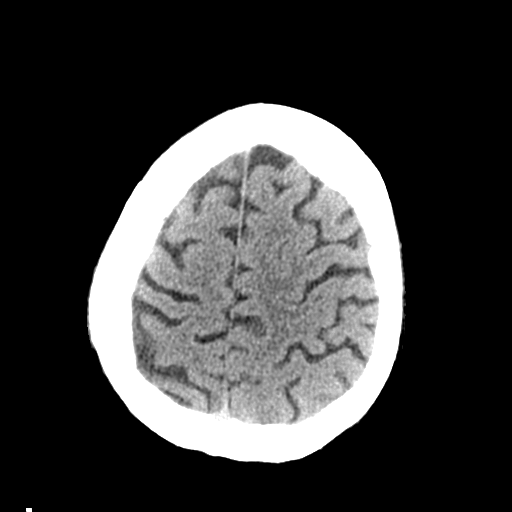
[im 27/33  brain]
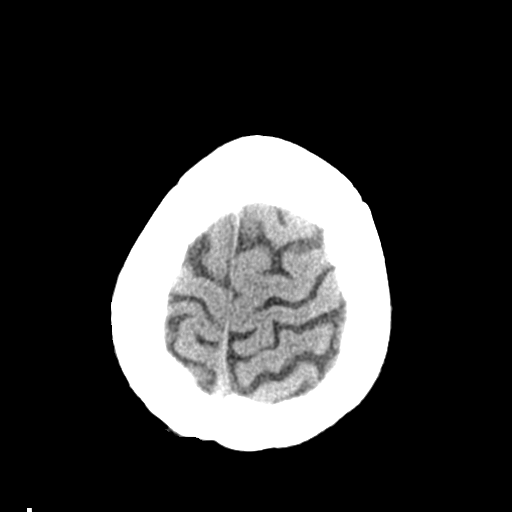
[im 27/33  bone]
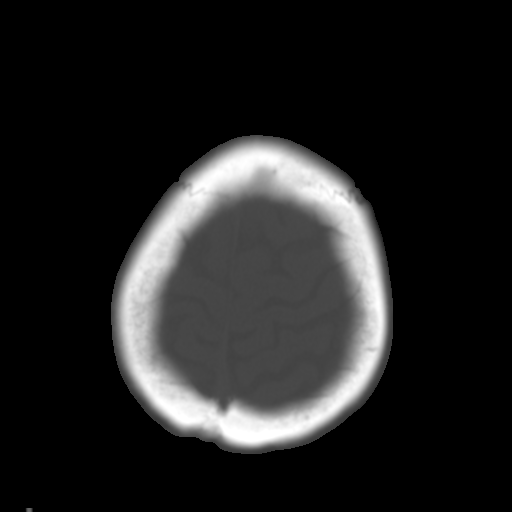
[im 30/33  brain]
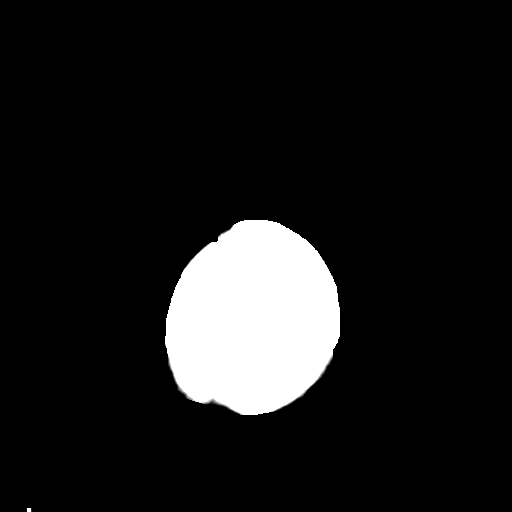

[Series 4: coronal soft tissue · coronal · 0.34mm/px · 3 of 71 slices shown]
[im 24/71  brain]
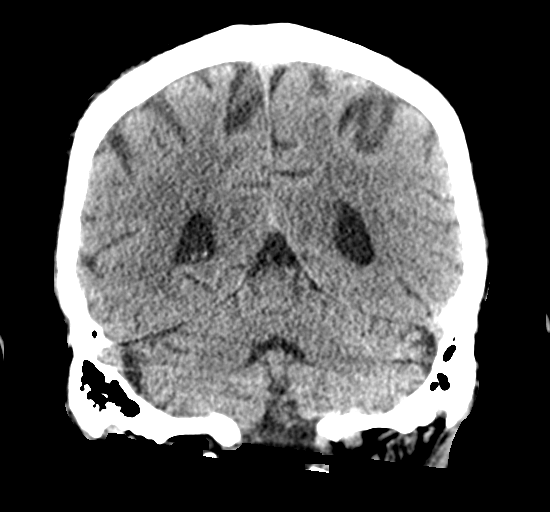
[im 32/71  brain]
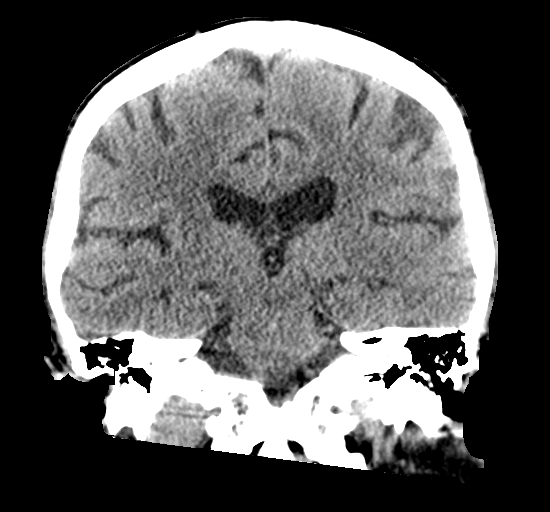
[im 39/71  brain]
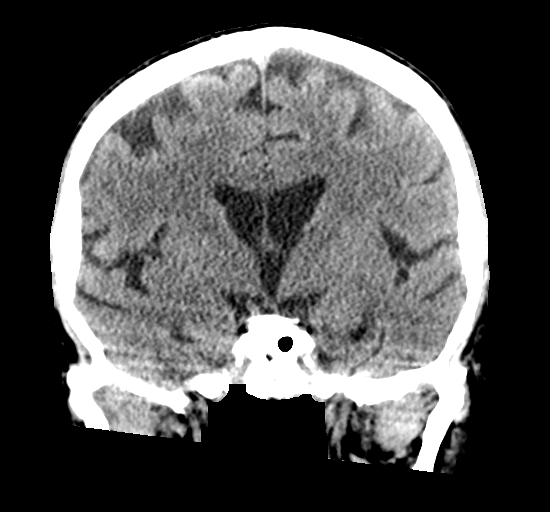

[Series 5: sagittal soft tissue · sagittal · 0.33mm/px · 3 of 58 slices shown]
[im 20/58  brain]
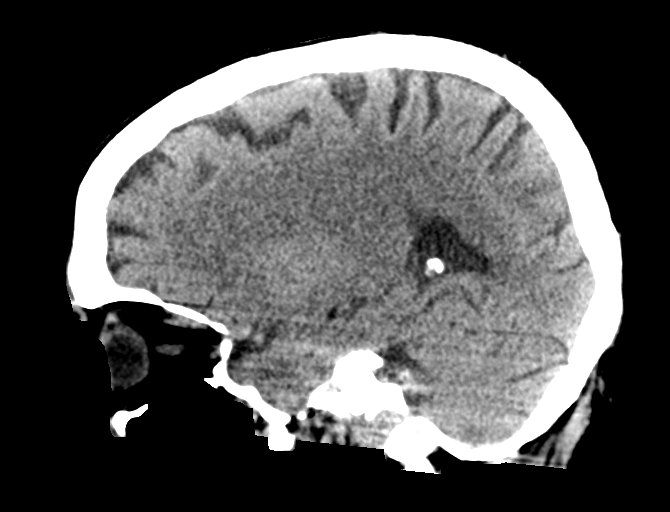
[im 29/58  brain]
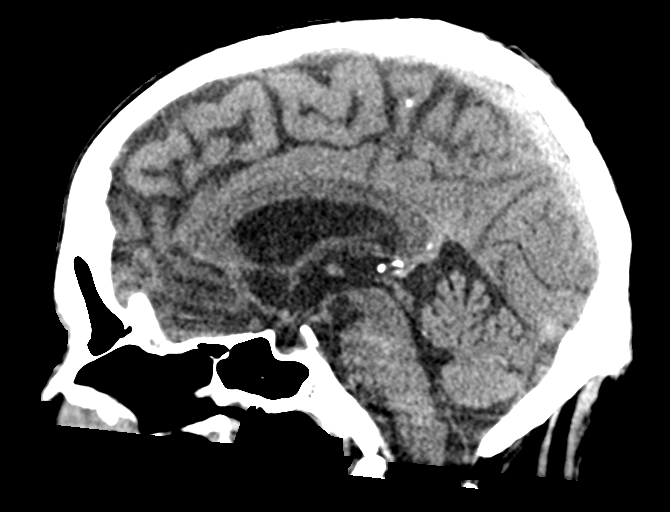
[im 39/58  brain]
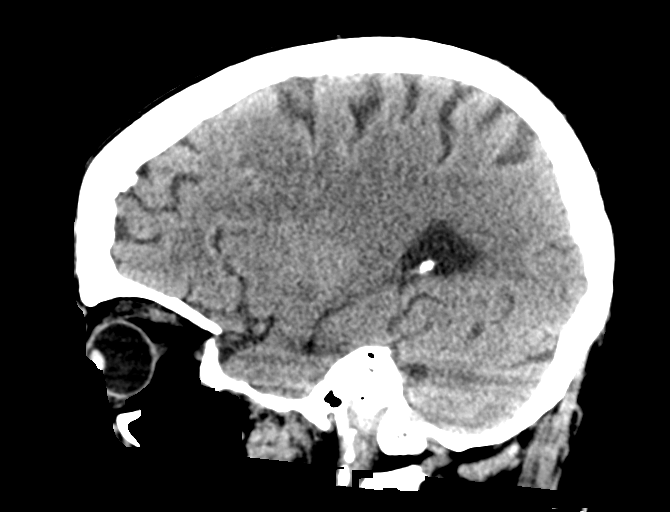

[16 of 47 positions shown; findings below may reference images not displayed]

FINDINGS: Brain: No evidence of acute infarction, hemorrhage, hydrocephalus,
extra-axial collection or mass lesion/mass effect. Mild generalized
cerebral atrophy.

Vascular: Atherosclerotic vascular calcification of the carotid
siphons. No hyperdense vessel.

Skull: Normal. Negative for fracture or focal lesion.

Sinuses/Orbits: No acute finding.

Other: None.
IMPRESSION: 1. No acute intracranial abnormality.

## 2020-12-12 IMAGING — CR DG CHEST 2V
1 series · 2 of 2 positions shown · non-contrast
Comparison: Radiograph 04/04/2019.

CLINICAL DATA: Syncope.  Suspected seizure.  Dizziness today.

EXAM:
CHEST - 2 VIEW

[Series 1: dg chest 2 view · 0.14mm/px · 2 of 2 slices shown]
[im 1/2]
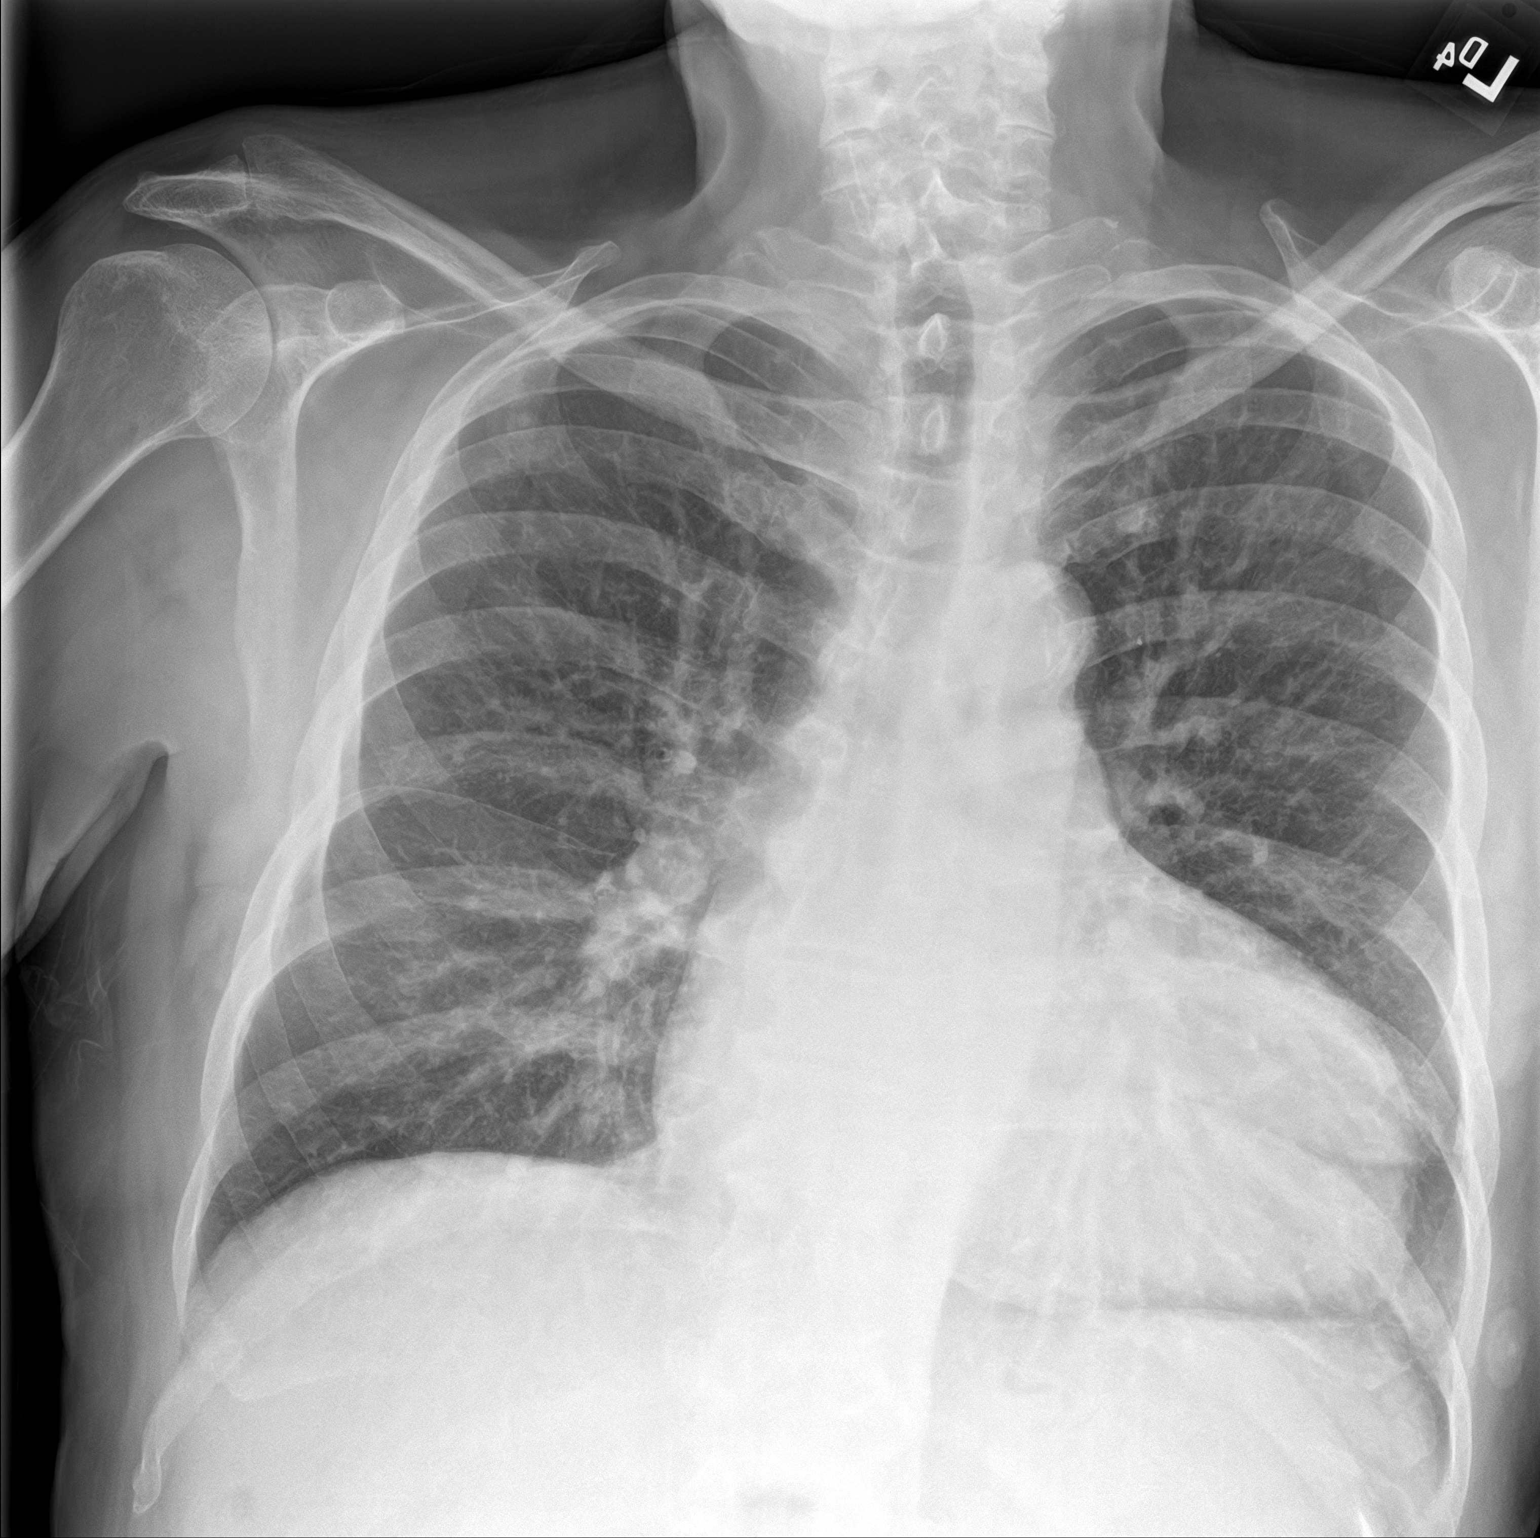
[im 2/2]
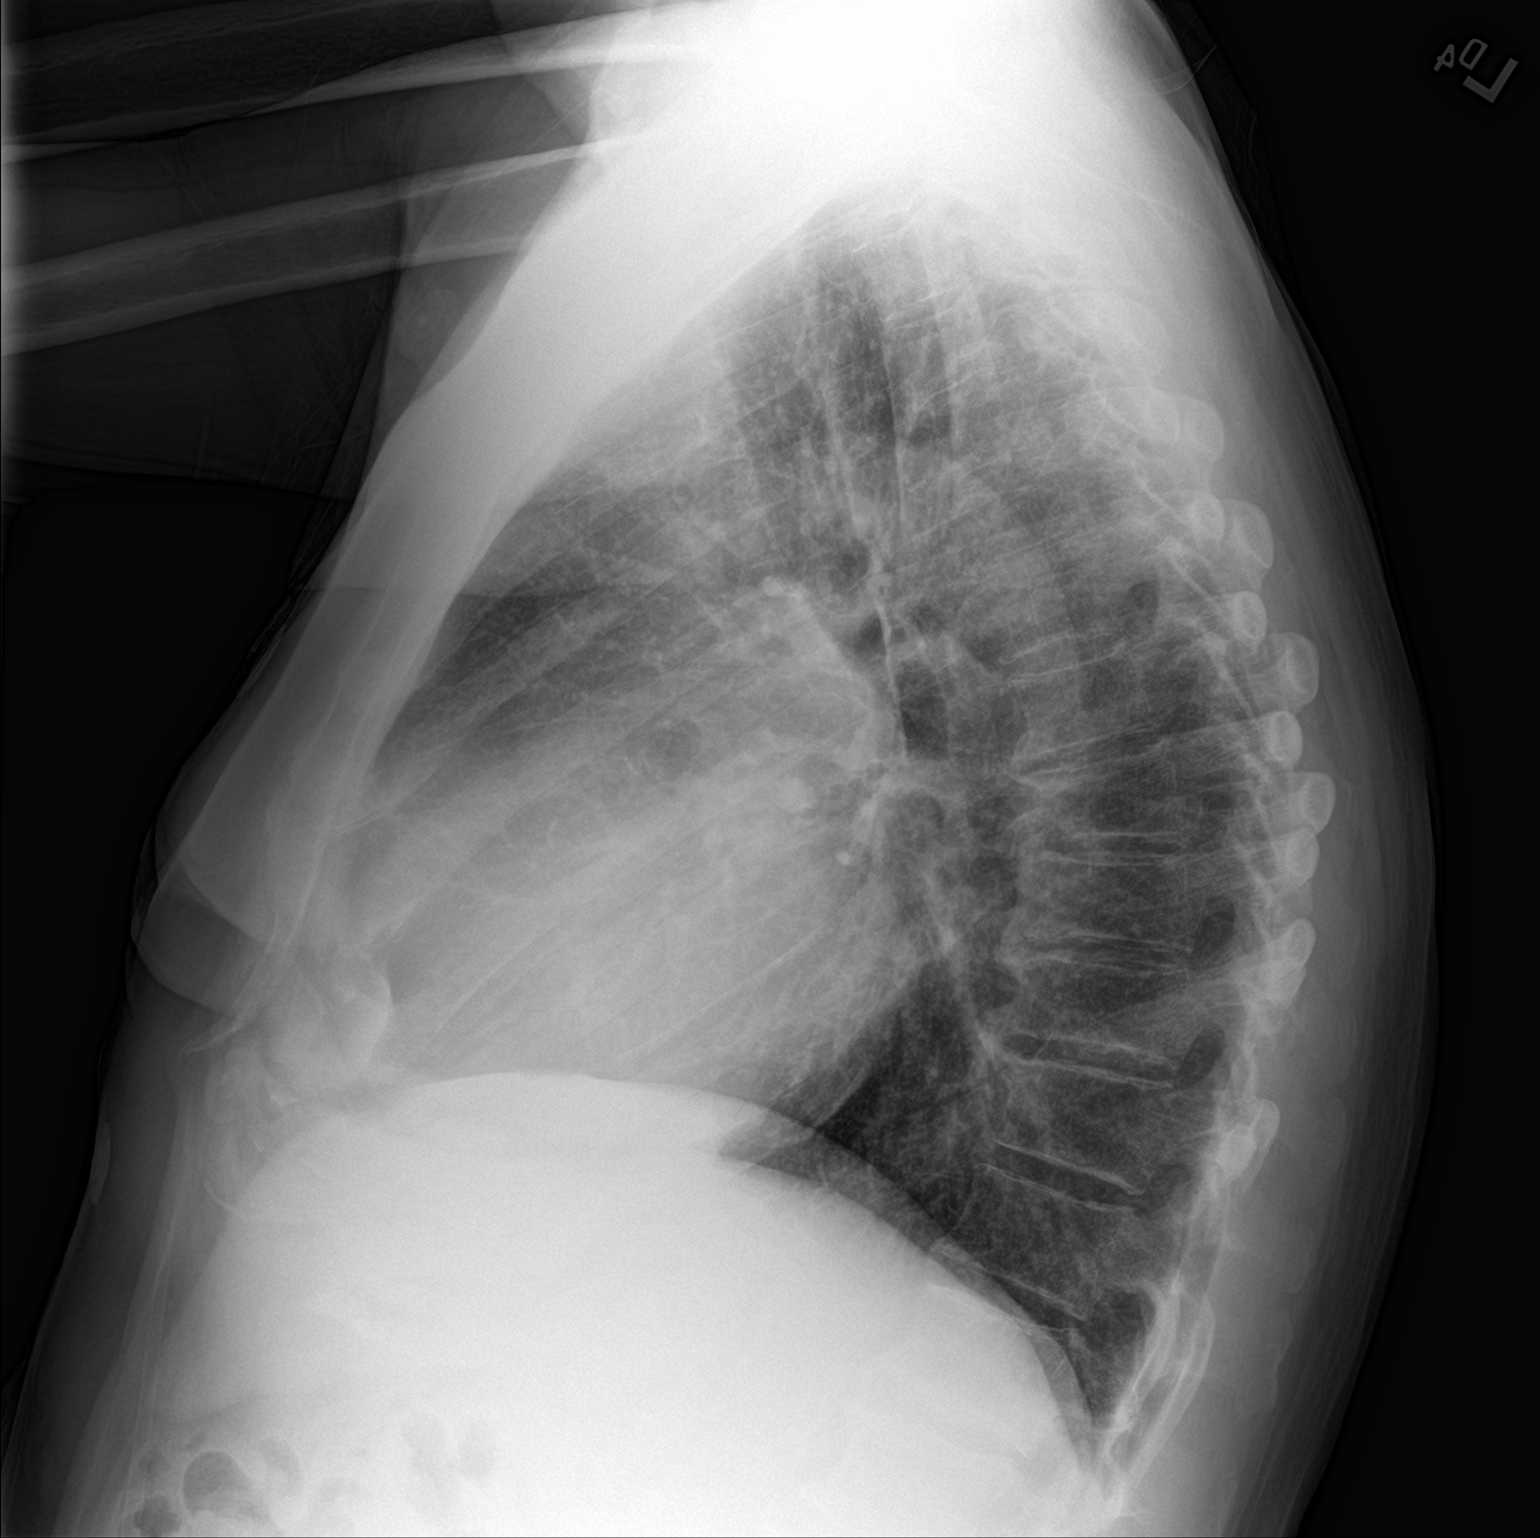

[2 of 2 positions shown; findings below may reference images not displayed]

FINDINGS: Cardiomegaly appears similar to prior exam. Unchanged mediastinal
contours. Bronchial and interstitial thickening with suggestion of
Kerley B-lines, suspicious for pulmonary edema. No significant
pleural fluid. No focal airspace disease. Mild multilevel
spondylosis without acute osseous abnormality.
IMPRESSION: Cardiomegaly with probable mild pulmonary edema. This appears
similar to March 2019 exam.

## 2021-01-16 IMAGING — CR DG CHEST 2V
1 series · 2 of 2 positions shown · non-contrast
Comparison: Radiographs 11/20/2019 and 04/04/2019.

CLINICAL DATA: Syncopal episodes.  Dizziness and weakness.

EXAM:
CHEST - 2 VIEW

[Series 1: dg chest 2 view · 0.14mm/px · 2 of 2 slices shown]
[im 1/2]
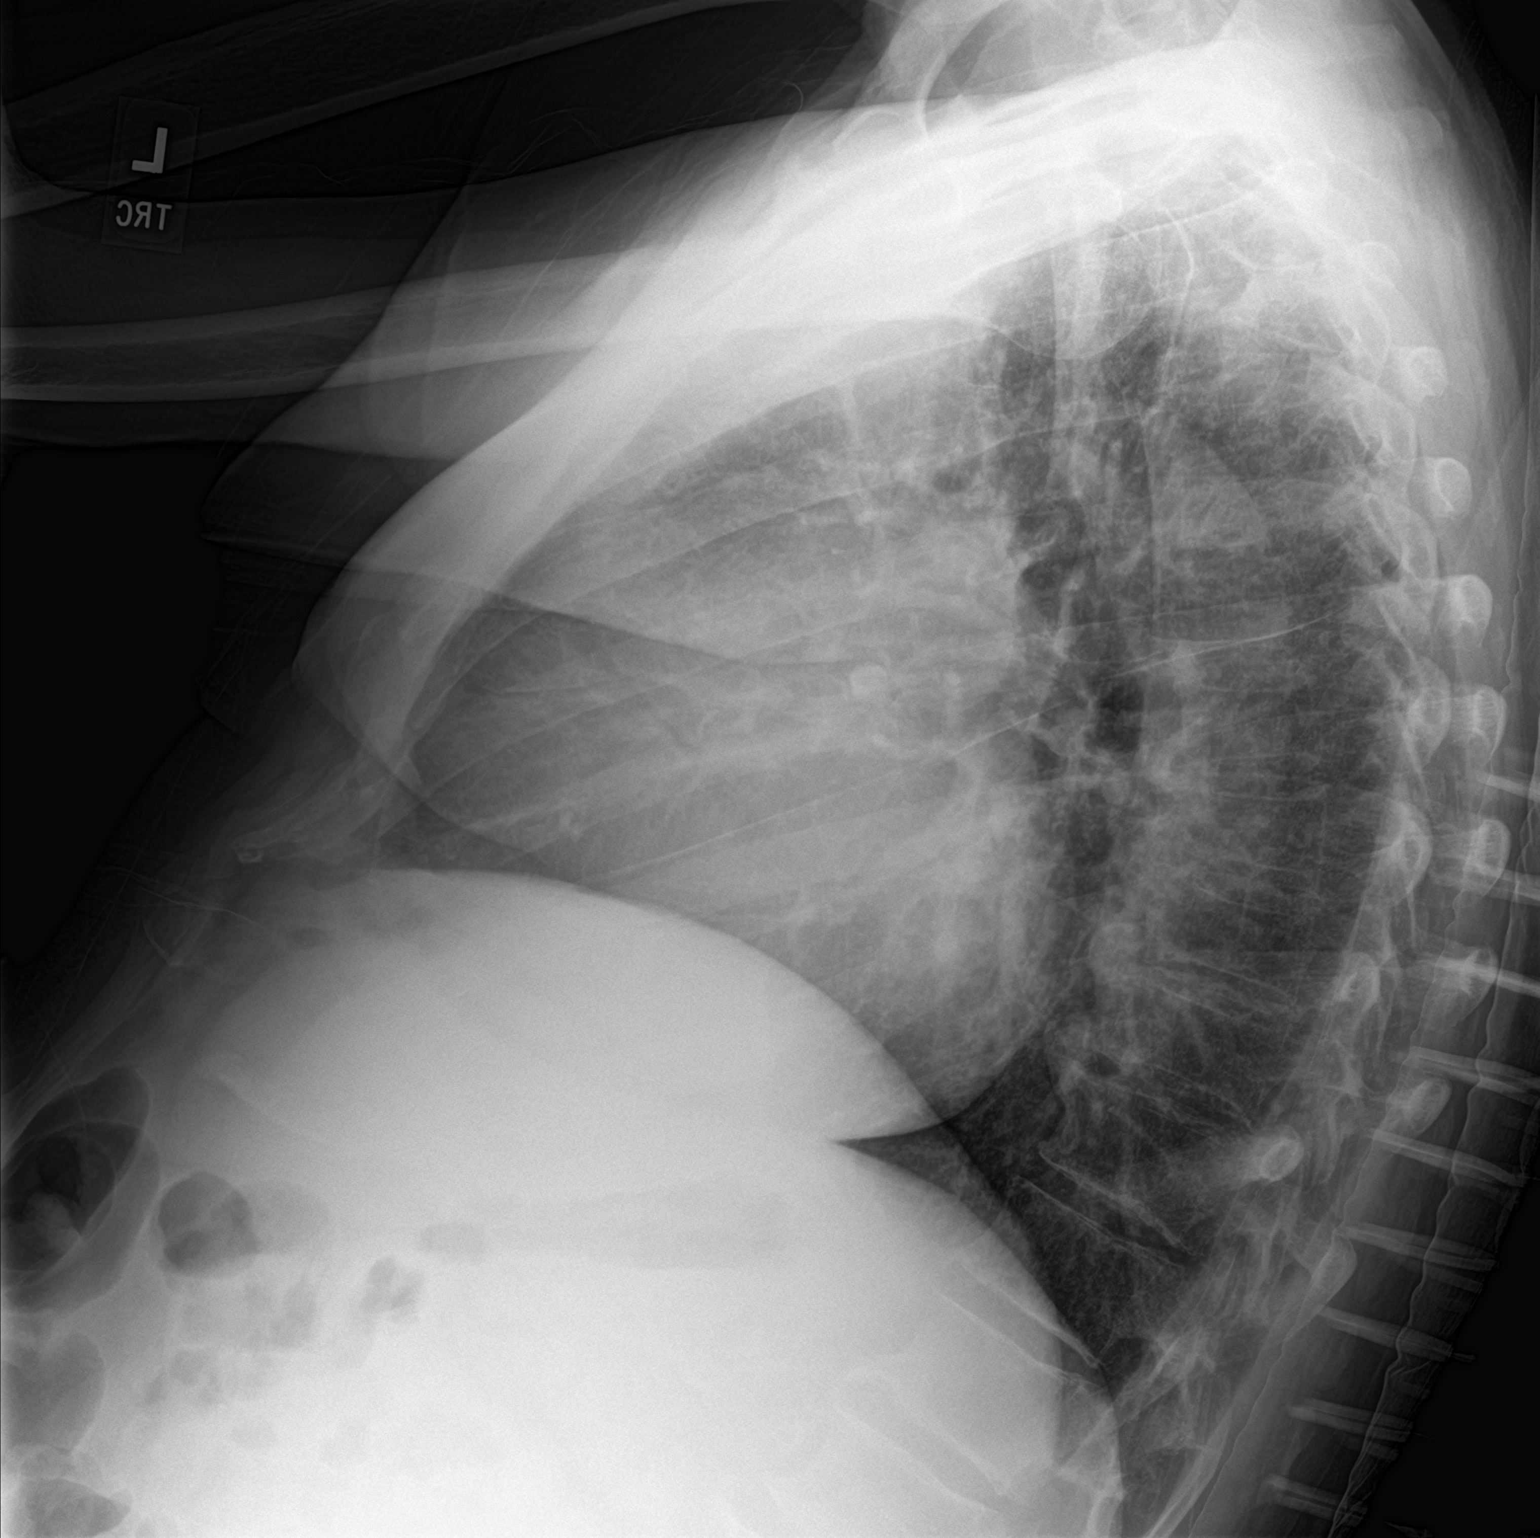
[im 2/2]
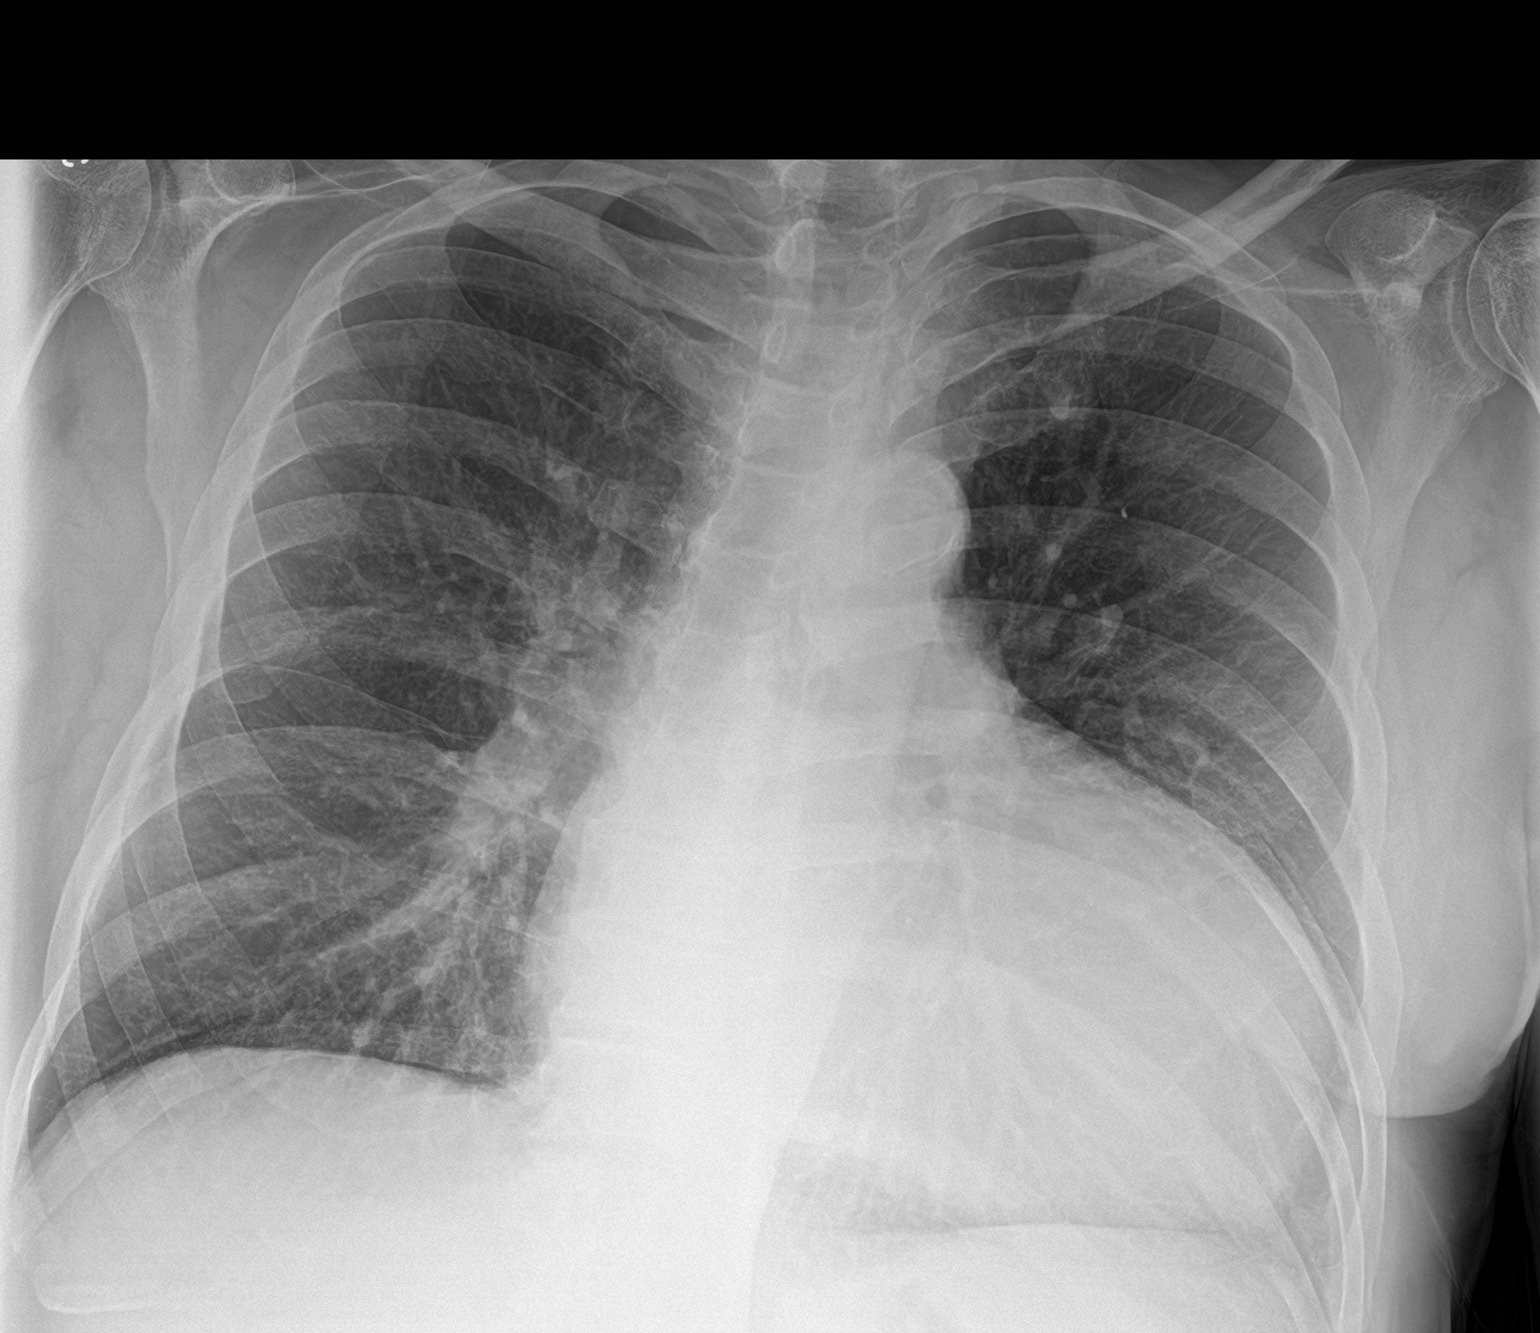

[2 of 2 positions shown; findings below may reference images not displayed]

FINDINGS: Stable cardiomegaly and aortic atherosclerosis. Pulmonary vascular
congestion has improved, and there is no definite residual/recurrent
edema or pleural effusion. There is no confluent airspace opacity or
pneumothorax. Mild degenerative changes are present within the
spine.
IMPRESSION: Cardiomegaly with improved pulmonary vascular congestion. No
definite edema or pleural effusion.

## 2021-02-07 IMAGING — US US ABDOMEN COMPLETE
1 series · 14 of 25 positions shown · non-contrast
Comparison: 10/16/2019 and prior.

CLINICAL DATA: cirrhosis of liver

EXAM:
ABDOMEN ULTRASOUND COMPLETE

[Series 1: us abdomen complete · 14 of 105 slices shown]
[im 1/105]
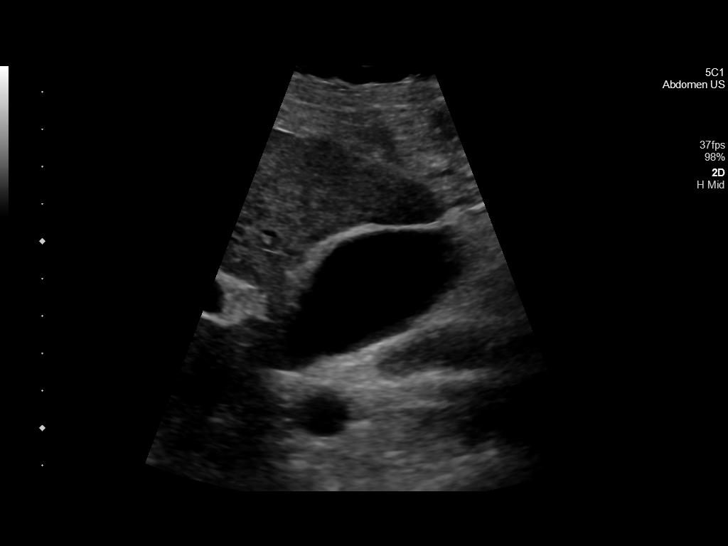
[im 9/105]
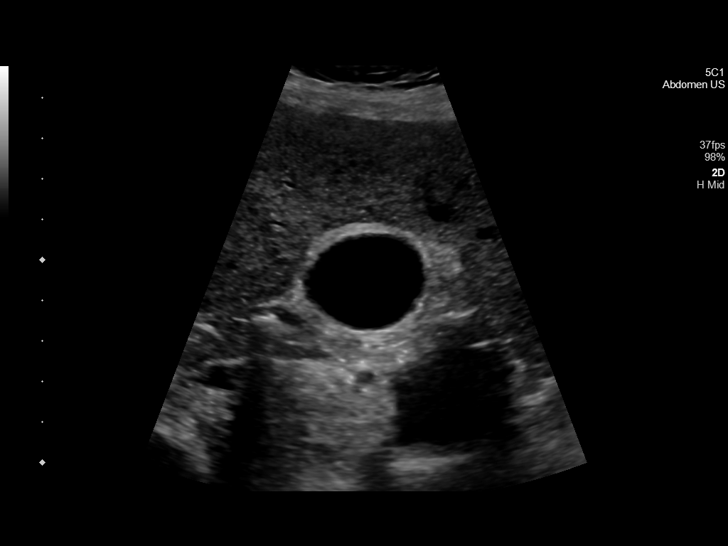
[im 18/105]
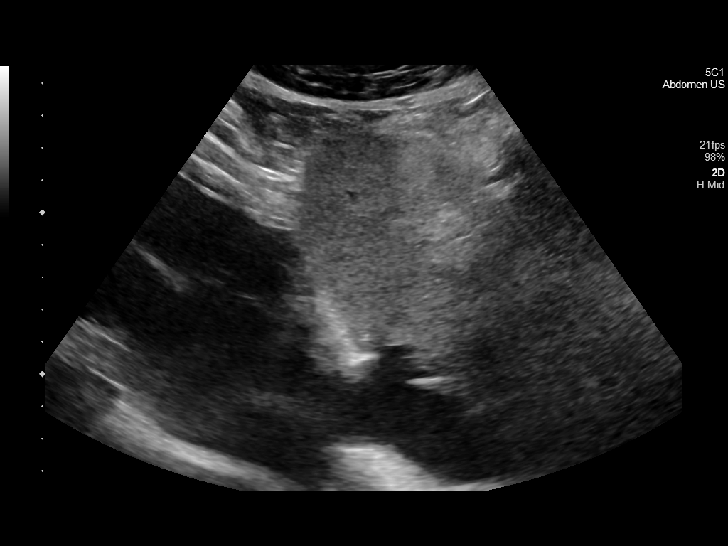
[im 27/105]
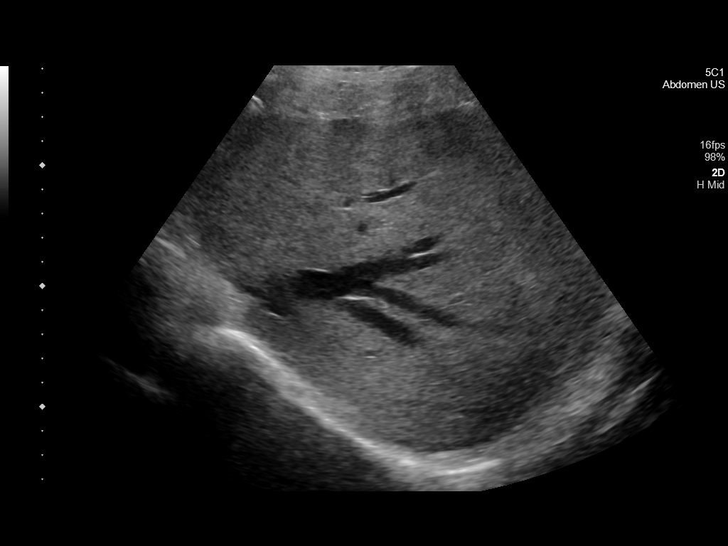
[im 35/105]
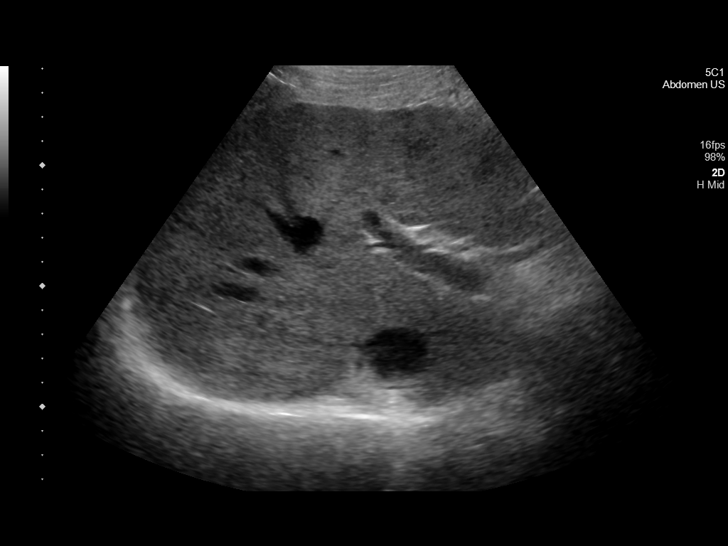
[im 40/105]
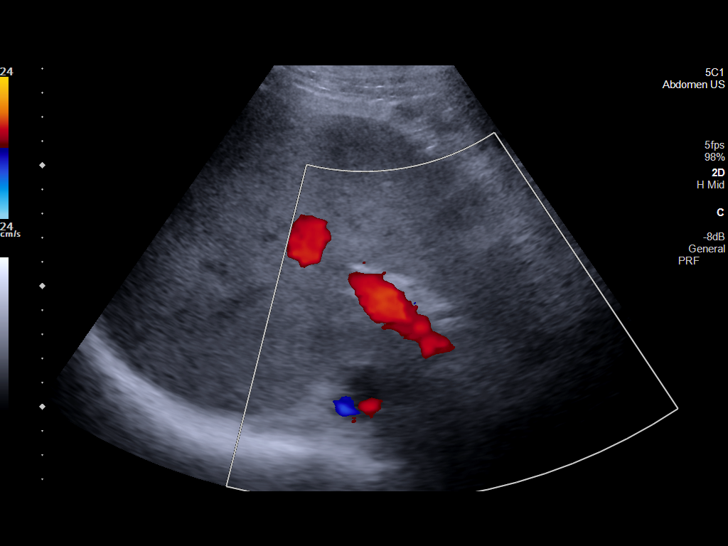
[im 48/105]
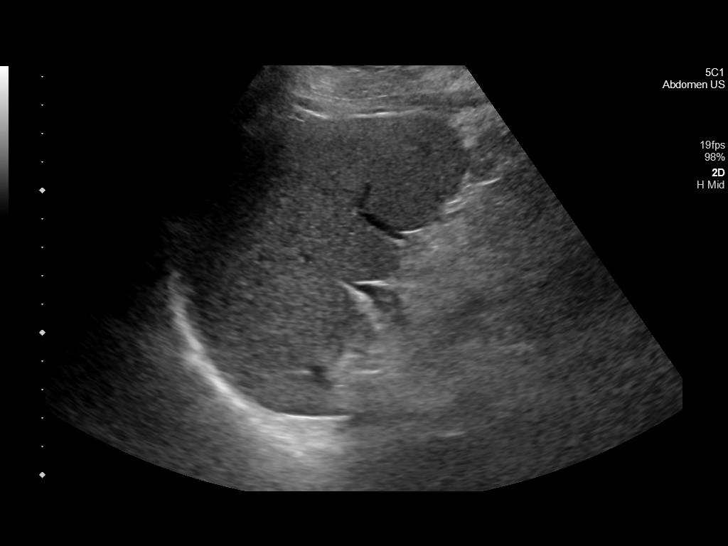
[im 57/105]
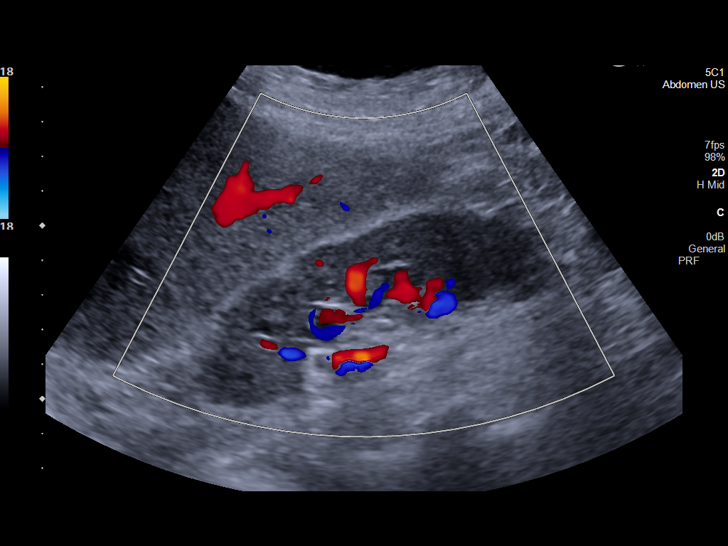
[im 66/105]
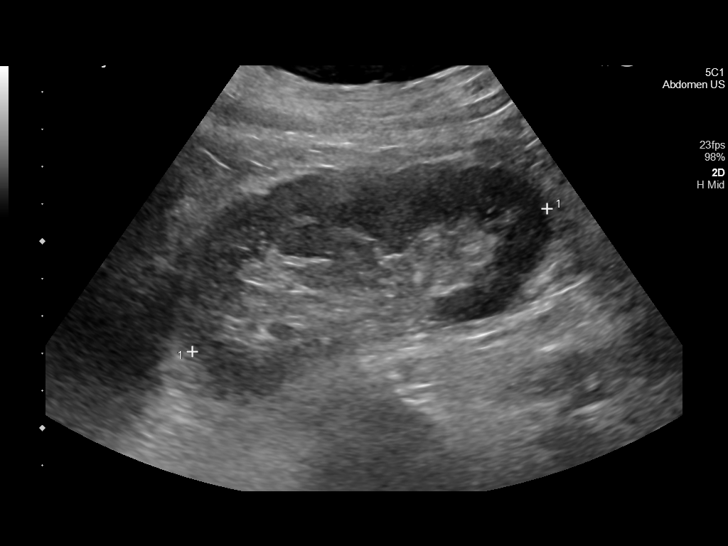
[im 70/105]
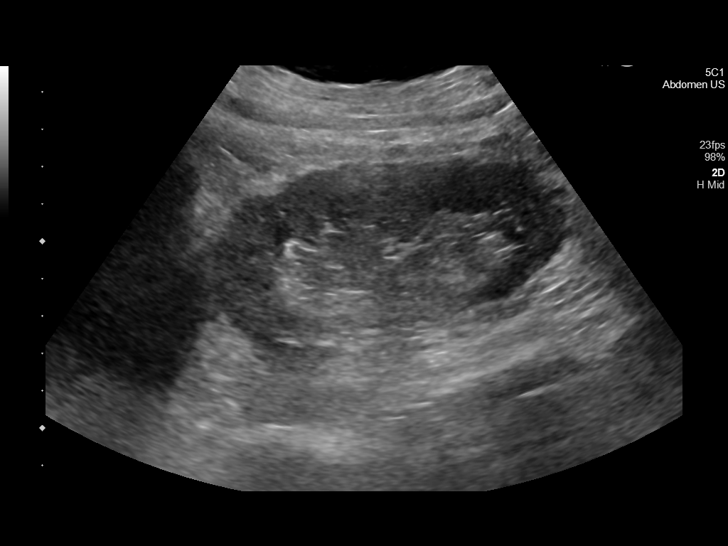
[im 79/105]
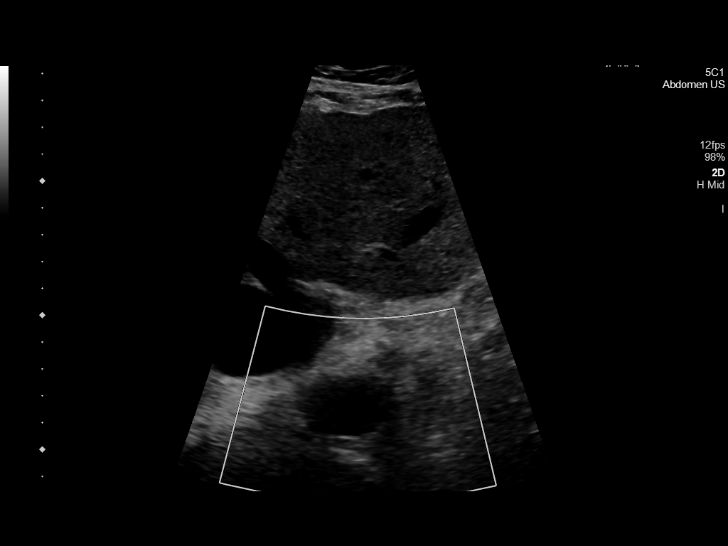
[im 87/105]
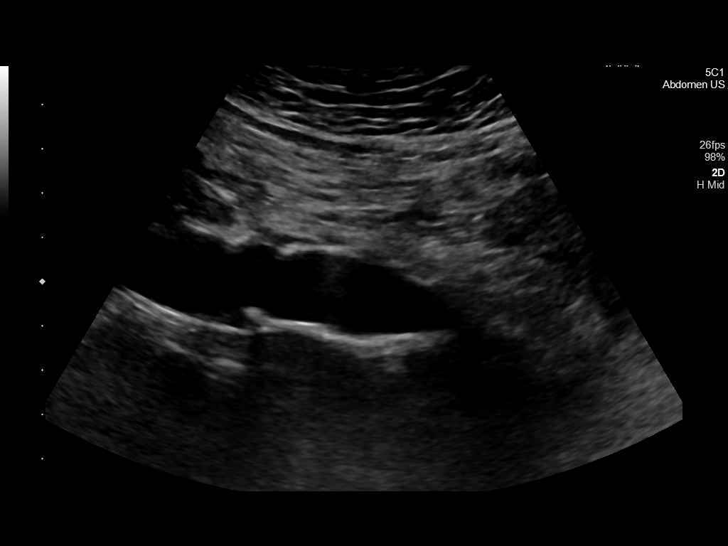
[im 96/105]
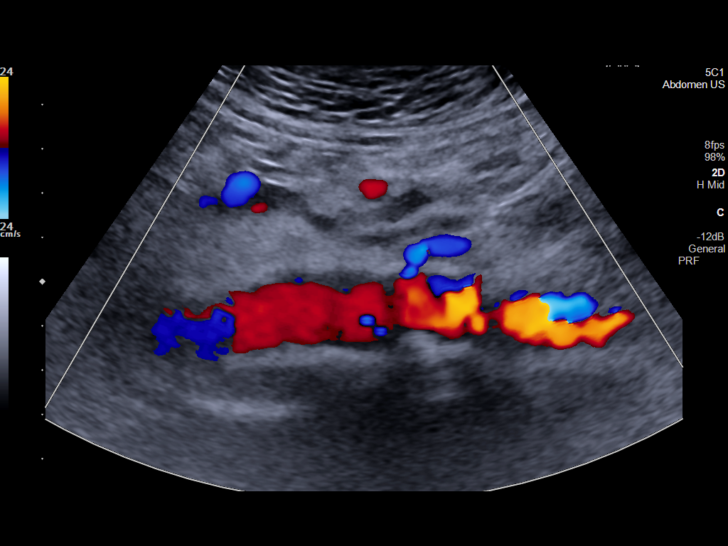
[im 105/105]
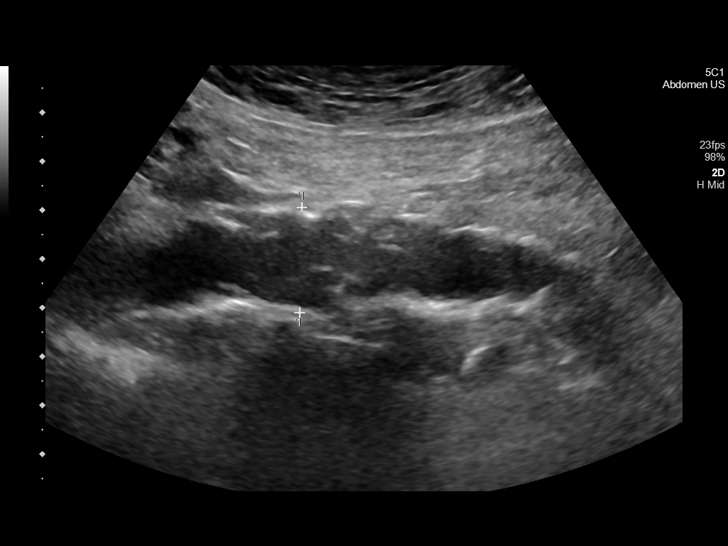

[14 of 25 positions shown; findings below may reference images not displayed]

FINDINGS: Gallbladder: No gallstones or wall thickening visualized. No
sonographic Murphy sign noted by sonographer.

Common bile duct: Diameter: 6.9 mm

Liver: No focal lesion identified. Heterogenous, echogenic
parenchyma with an undulating contour. Portal vein is patent on
color Doppler imaging with normal direction of blood flow towards
the liver.

IVC: No abnormality visualized.

Pancreas: Visualized portion unremarkable.

Spleen: Enlarged with a volume of 667 mL.  Normal echogenicity.

Right Kidney: Length: 11.2 cm. Echogenicity within normal limits. No
mass or hydronephrosis visualized.

Left Kidney: Length: 10.2 cm. Echogenicity within normal limits. No
mass or hydronephrosis visualized.

Abdominal aorta: No aneurysm visualized.  Atheromatous plaque.

Other findings: None.
IMPRESSION: Cirrhotic liver morphology.  No focal hepatic lesion.

Splenomegaly.
